# Patient Record
Sex: Male | Born: 1935 | Race: White | Hispanic: No | Marital: Single | Smoking: Former smoker
Health system: Southern US, Community
[De-identification: ages and names within clinical notes are randomized; demographics above are authoritative.]

## PROBLEM LIST (undated history)

## (undated) DIAGNOSIS — I272 Pulmonary hypertension, unspecified: Secondary | ICD-10-CM

## (undated) DIAGNOSIS — K219 Gastro-esophageal reflux disease without esophagitis: Secondary | ICD-10-CM

## (undated) DIAGNOSIS — IMO0002 Reserved for concepts with insufficient information to code with codable children: Secondary | ICD-10-CM

## (undated) DIAGNOSIS — T464X5A Adverse effect of angiotensin-converting-enzyme inhibitors, initial encounter: Secondary | ICD-10-CM

## (undated) DIAGNOSIS — IMO0001 Reserved for inherently not codable concepts without codable children: Secondary | ICD-10-CM

## (undated) DIAGNOSIS — R0602 Shortness of breath: Secondary | ICD-10-CM

## (undated) DIAGNOSIS — E7800 Pure hypercholesterolemia, unspecified: Secondary | ICD-10-CM

## (undated) DIAGNOSIS — E1140 Type 2 diabetes mellitus with diabetic neuropathy, unspecified: Secondary | ICD-10-CM

## (undated) DIAGNOSIS — I10 Essential (primary) hypertension: Secondary | ICD-10-CM

## (undated) DIAGNOSIS — R9430 Abnormal result of cardiovascular function study, unspecified: Secondary | ICD-10-CM

## (undated) DIAGNOSIS — I7100 Dissection of unspecified site of aorta: Secondary | ICD-10-CM

## (undated) DIAGNOSIS — I350 Nonrheumatic aortic (valve) stenosis: Secondary | ICD-10-CM

## (undated) DIAGNOSIS — Z7901 Long term (current) use of anticoagulants: Secondary | ICD-10-CM

## (undated) DIAGNOSIS — S62309A Unspecified fracture of unspecified metacarpal bone, initial encounter for closed fracture: Secondary | ICD-10-CM

## (undated) DIAGNOSIS — R058 Other specified cough: Secondary | ICD-10-CM

## (undated) DIAGNOSIS — E785 Hyperlipidemia, unspecified: Secondary | ICD-10-CM

## (undated) DIAGNOSIS — R05 Cough: Secondary | ICD-10-CM

## (undated) DIAGNOSIS — C679 Malignant neoplasm of bladder, unspecified: Secondary | ICD-10-CM

## (undated) DIAGNOSIS — Z9889 Other specified postprocedural states: Secondary | ICD-10-CM

## (undated) DIAGNOSIS — C61 Malignant neoplasm of prostate: Secondary | ICD-10-CM

## (undated) DIAGNOSIS — Z9289 Personal history of other medical treatment: Secondary | ICD-10-CM

## (undated) DIAGNOSIS — K5731 Diverticulosis of large intestine without perforation or abscess with bleeding: Secondary | ICD-10-CM

## (undated) DIAGNOSIS — I340 Nonrheumatic mitral (valve) insufficiency: Secondary | ICD-10-CM

## (undated) DIAGNOSIS — I4891 Unspecified atrial fibrillation: Secondary | ICD-10-CM

## (undated) HISTORY — DX: Personal history of other medical treatment: Z92.89

## (undated) HISTORY — DX: Shortness of breath: R06.02

## (undated) HISTORY — DX: Other specified cough: R05.8

## (undated) HISTORY — DX: Abnormal result of cardiovascular function study, unspecified: R94.30

## (undated) HISTORY — DX: Hyperlipidemia, unspecified: E78.5

## (undated) HISTORY — DX: Type 2 diabetes mellitus with diabetic neuropathy, unspecified: E11.40

## (undated) HISTORY — DX: Adverse effect of angiotensin-converting-enzyme inhibitors, initial encounter: T46.4X5A

## (undated) HISTORY — DX: Cough: R05

## (undated) HISTORY — PX: OTHER SURGICAL HISTORY: SHX169

## (undated) HISTORY — DX: Unspecified atrial fibrillation: I48.91

## (undated) HISTORY — DX: Unspecified fracture of unspecified metacarpal bone, initial encounter for closed fracture: S62.309A

## (undated) HISTORY — PX: LUMBAR LAMINECTOMY: SHX95

## (undated) HISTORY — DX: Reserved for inherently not codable concepts without codable children: IMO0001

## (undated) HISTORY — DX: Nonrheumatic mitral (valve) insufficiency: I34.0

## (undated) HISTORY — DX: Reserved for concepts with insufficient information to code with codable children: IMO0002

## (undated) HISTORY — DX: Gastro-esophageal reflux disease without esophagitis: K21.9

## (undated) HISTORY — DX: Dissection of unspecified site of aorta: I71.00

## (undated) HISTORY — DX: Other specified postprocedural states: Z98.890

## (undated) HISTORY — DX: Malignant neoplasm of bladder, unspecified: C67.9

## (undated) HISTORY — DX: Pulmonary hypertension, unspecified: I27.20

## (undated) HISTORY — DX: Essential (primary) hypertension: I10

## (undated) HISTORY — DX: Long term (current) use of anticoagulants: Z79.01

## (undated) HISTORY — DX: Nonrheumatic aortic (valve) stenosis: I35.0

---

## 2000-09-19 ENCOUNTER — Emergency Department (HOSPITAL_COMMUNITY): Admission: EM | Admit: 2000-09-19 | Discharge: 2000-09-19 | Payer: Self-pay | Admitting: Emergency Medicine

## 2000-09-19 ENCOUNTER — Encounter: Payer: Self-pay | Admitting: Emergency Medicine

## 2000-09-29 ENCOUNTER — Ambulatory Visit (HOSPITAL_COMMUNITY): Admission: RE | Admit: 2000-09-29 | Discharge: 2000-09-29 | Payer: Self-pay | Admitting: Internal Medicine

## 2000-09-29 ENCOUNTER — Encounter: Payer: Self-pay | Admitting: Internal Medicine

## 2000-10-05 ENCOUNTER — Ambulatory Visit: Admission: RE | Admit: 2000-10-05 | Discharge: 2000-10-05 | Payer: Self-pay | Admitting: Neurosurgery

## 2000-10-05 ENCOUNTER — Encounter: Payer: Self-pay | Admitting: Neurosurgery

## 2000-10-07 ENCOUNTER — Ambulatory Visit (HOSPITAL_COMMUNITY): Admission: RE | Admit: 2000-10-07 | Discharge: 2000-10-07 | Payer: Self-pay | Admitting: Cardiology

## 2000-10-19 ENCOUNTER — Inpatient Hospital Stay (HOSPITAL_COMMUNITY): Admission: RE | Admit: 2000-10-19 | Discharge: 2000-10-20 | Payer: Self-pay | Admitting: Neurosurgery

## 2000-10-19 ENCOUNTER — Encounter: Payer: Self-pay | Admitting: Neurosurgery

## 2004-01-12 ENCOUNTER — Ambulatory Visit: Payer: Self-pay | Admitting: Cardiology

## 2004-01-24 ENCOUNTER — Ambulatory Visit: Payer: Self-pay | Admitting: Cardiology

## 2004-02-21 ENCOUNTER — Ambulatory Visit: Payer: Self-pay | Admitting: *Deleted

## 2004-03-06 ENCOUNTER — Ambulatory Visit: Payer: Self-pay | Admitting: Cardiology

## 2004-03-07 ENCOUNTER — Ambulatory Visit: Payer: Self-pay | Admitting: Internal Medicine

## 2004-03-15 ENCOUNTER — Ambulatory Visit: Payer: Self-pay | Admitting: Cardiovascular Disease

## 2004-03-20 ENCOUNTER — Encounter: Admission: RE | Admit: 2004-03-20 | Discharge: 2004-03-20 | Payer: Self-pay | Admitting: Urology

## 2004-03-22 ENCOUNTER — Ambulatory Visit (HOSPITAL_BASED_OUTPATIENT_CLINIC_OR_DEPARTMENT_OTHER): Admission: RE | Admit: 2004-03-22 | Discharge: 2004-03-22 | Payer: Self-pay | Admitting: Urology

## 2004-03-22 ENCOUNTER — Encounter (INDEPENDENT_AMBULATORY_CARE_PROVIDER_SITE_OTHER): Payer: Self-pay | Admitting: *Deleted

## 2004-03-22 ENCOUNTER — Ambulatory Visit (HOSPITAL_COMMUNITY): Admission: RE | Admit: 2004-03-22 | Discharge: 2004-03-22 | Payer: Self-pay | Admitting: Urology

## 2004-03-22 HISTORY — PX: CYSTOSTOMY W/ BLADDER BIOPSY: SHX1431

## 2004-04-02 ENCOUNTER — Ambulatory Visit: Payer: Self-pay | Admitting: *Deleted

## 2004-04-26 ENCOUNTER — Ambulatory Visit: Payer: Self-pay | Admitting: Cardiology

## 2004-04-29 ENCOUNTER — Ambulatory Visit (HOSPITAL_COMMUNITY): Admission: RE | Admit: 2004-04-29 | Discharge: 2004-04-29 | Payer: Self-pay | Admitting: Internal Medicine

## 2004-04-29 ENCOUNTER — Ambulatory Visit: Payer: Self-pay | Admitting: Internal Medicine

## 2004-05-07 ENCOUNTER — Encounter (INDEPENDENT_AMBULATORY_CARE_PROVIDER_SITE_OTHER): Payer: Self-pay | Admitting: Specialist

## 2004-05-07 ENCOUNTER — Inpatient Hospital Stay (HOSPITAL_COMMUNITY): Admission: RE | Admit: 2004-05-07 | Discharge: 2004-05-08 | Payer: Self-pay | Admitting: Urology

## 2004-05-20 ENCOUNTER — Ambulatory Visit: Payer: Self-pay | Admitting: Cardiology

## 2004-06-03 ENCOUNTER — Ambulatory Visit: Payer: Self-pay | Admitting: Cardiology

## 2004-06-17 ENCOUNTER — Ambulatory Visit: Payer: Self-pay | Admitting: Internal Medicine

## 2004-07-01 ENCOUNTER — Ambulatory Visit: Payer: Self-pay | Admitting: Cardiology

## 2004-07-30 ENCOUNTER — Ambulatory Visit: Payer: Self-pay | Admitting: *Deleted

## 2004-08-27 ENCOUNTER — Ambulatory Visit: Payer: Self-pay | Admitting: Cardiology

## 2004-09-20 ENCOUNTER — Ambulatory Visit: Payer: Self-pay | Admitting: Cardiology

## 2004-10-18 ENCOUNTER — Ambulatory Visit: Payer: Self-pay | Admitting: Internal Medicine

## 2004-11-15 ENCOUNTER — Ambulatory Visit: Payer: Self-pay | Admitting: Cardiology

## 2004-11-19 ENCOUNTER — Ambulatory Visit: Payer: Self-pay | Admitting: Internal Medicine

## 2004-12-06 ENCOUNTER — Ambulatory Visit: Payer: Self-pay | Admitting: Internal Medicine

## 2005-01-13 ENCOUNTER — Ambulatory Visit: Payer: Self-pay | Admitting: Cardiology

## 2005-01-14 ENCOUNTER — Ambulatory Visit: Payer: Self-pay | Admitting: Internal Medicine

## 2005-01-28 ENCOUNTER — Ambulatory Visit: Payer: Self-pay | Admitting: Internal Medicine

## 2005-02-10 ENCOUNTER — Ambulatory Visit: Payer: Self-pay | Admitting: Cardiology

## 2005-03-10 ENCOUNTER — Ambulatory Visit: Payer: Self-pay | Admitting: Cardiology

## 2005-04-04 ENCOUNTER — Ambulatory Visit: Payer: Self-pay | Admitting: Cardiology

## 2005-05-02 ENCOUNTER — Ambulatory Visit: Payer: Self-pay | Admitting: Cardiology

## 2005-05-06 ENCOUNTER — Ambulatory Visit: Payer: Self-pay | Admitting: Internal Medicine

## 2005-05-16 ENCOUNTER — Ambulatory Visit: Payer: Self-pay | Admitting: Cardiovascular Disease

## 2005-06-13 ENCOUNTER — Ambulatory Visit: Payer: Self-pay

## 2005-06-13 ENCOUNTER — Ambulatory Visit: Payer: Self-pay | Admitting: Internal Medicine

## 2005-07-18 ENCOUNTER — Ambulatory Visit: Payer: Self-pay | Admitting: Internal Medicine

## 2005-08-05 ENCOUNTER — Ambulatory Visit: Payer: Self-pay | Admitting: Internal Medicine

## 2005-08-27 ENCOUNTER — Ambulatory Visit: Payer: Self-pay | Admitting: Internal Medicine

## 2005-08-27 ENCOUNTER — Ambulatory Visit: Payer: Self-pay | Admitting: Cardiology

## 2005-10-14 ENCOUNTER — Ambulatory Visit: Payer: Self-pay | Admitting: Cardiology

## 2005-11-04 ENCOUNTER — Ambulatory Visit: Payer: Self-pay | Admitting: Internal Medicine

## 2005-11-11 ENCOUNTER — Ambulatory Visit: Payer: Self-pay | Admitting: Cardiovascular Disease

## 2005-11-24 ENCOUNTER — Ambulatory Visit: Payer: Self-pay | Admitting: Cardiology

## 2005-12-08 ENCOUNTER — Ambulatory Visit: Payer: Self-pay | Admitting: Cardiovascular Disease

## 2005-12-18 ENCOUNTER — Ambulatory Visit: Payer: Self-pay | Admitting: Internal Medicine

## 2005-12-22 ENCOUNTER — Ambulatory Visit: Payer: Self-pay | Admitting: Cardiovascular Disease

## 2006-01-05 ENCOUNTER — Ambulatory Visit: Payer: Self-pay | Admitting: Cardiology

## 2006-01-26 ENCOUNTER — Ambulatory Visit: Payer: Self-pay | Admitting: Cardiology

## 2006-02-16 ENCOUNTER — Ambulatory Visit: Payer: Self-pay | Admitting: Cardiology

## 2006-03-16 ENCOUNTER — Ambulatory Visit: Payer: Self-pay | Admitting: Cardiology

## 2006-03-30 ENCOUNTER — Ambulatory Visit: Payer: Self-pay | Admitting: Internal Medicine

## 2006-04-20 ENCOUNTER — Ambulatory Visit: Payer: Self-pay | Admitting: Internal Medicine

## 2006-05-18 ENCOUNTER — Ambulatory Visit: Payer: Self-pay | Admitting: Internal Medicine

## 2006-06-03 ENCOUNTER — Ambulatory Visit: Payer: Self-pay | Admitting: Internal Medicine

## 2006-06-03 LAB — CONVERTED CEMR LAB
ALT: 22 units/L (ref 0–40)
AST: 20 units/L (ref 0–37)
BUN: 14 mg/dL (ref 6–23)
CO2: 32 meq/L (ref 19–32)
Calcium: 9.2 mg/dL (ref 8.4–10.5)
Creatinine, Ser: 0.8 mg/dL (ref 0.4–1.5)
Creatinine,U: 146.8 mg/dL
Microalb Creat Ratio: 15.7 mg/g (ref 0.0–30.0)
Microalb, Ur: 2.3 mg/dL — ABNORMAL HIGH (ref 0.0–1.9)
Potassium: 3.9 meq/L (ref 3.5–5.1)
Triglycerides: 123 mg/dL (ref 0–149)
VLDL: 25 mg/dL (ref 0–40)

## 2006-06-15 ENCOUNTER — Ambulatory Visit: Payer: Self-pay | Admitting: Cardiology

## 2006-06-17 ENCOUNTER — Ambulatory Visit: Payer: Self-pay | Admitting: Internal Medicine

## 2006-07-02 ENCOUNTER — Ambulatory Visit: Payer: Self-pay | Admitting: Cardiovascular Disease

## 2006-07-21 ENCOUNTER — Ambulatory Visit: Payer: Self-pay | Admitting: Internal Medicine

## 2006-07-21 ENCOUNTER — Ambulatory Visit: Payer: Self-pay | Admitting: Cardiology

## 2006-07-21 LAB — CONVERTED CEMR LAB
Chloride: 106 meq/L (ref 96–112)
Creatinine, Ser: 0.8 mg/dL (ref 0.4–1.5)
GFR calc non Af Amer: 100 mL/min
Glucose, Bld: 114 mg/dL — ABNORMAL HIGH (ref 70–99)
Potassium: 3.9 meq/L (ref 3.5–5.1)
Sodium: 141 meq/L (ref 135–145)

## 2006-08-27 ENCOUNTER — Ambulatory Visit: Payer: Self-pay | Admitting: Cardiology

## 2006-09-15 ENCOUNTER — Ambulatory Visit: Payer: Self-pay | Admitting: Internal Medicine

## 2006-09-24 ENCOUNTER — Ambulatory Visit: Payer: Self-pay | Admitting: Cardiology

## 2006-10-22 ENCOUNTER — Ambulatory Visit: Payer: Self-pay | Admitting: Cardiology

## 2006-11-06 ENCOUNTER — Ambulatory Visit: Payer: Self-pay | Admitting: Internal Medicine

## 2006-11-20 ENCOUNTER — Ambulatory Visit: Payer: Self-pay | Admitting: Cardiology

## 2006-12-04 ENCOUNTER — Encounter: Payer: Self-pay | Admitting: Internal Medicine

## 2006-12-04 ENCOUNTER — Ambulatory Visit: Payer: Self-pay | Admitting: Internal Medicine

## 2006-12-04 DIAGNOSIS — C679 Malignant neoplasm of bladder, unspecified: Secondary | ICD-10-CM

## 2006-12-04 DIAGNOSIS — E1140 Type 2 diabetes mellitus with diabetic neuropathy, unspecified: Secondary | ICD-10-CM

## 2006-12-04 HISTORY — DX: Type 2 diabetes mellitus with diabetic neuropathy, unspecified: E11.40

## 2006-12-11 ENCOUNTER — Ambulatory Visit: Payer: Self-pay | Admitting: Cardiology

## 2007-01-08 ENCOUNTER — Ambulatory Visit: Payer: Self-pay | Admitting: Cardiology

## 2007-02-05 ENCOUNTER — Ambulatory Visit: Payer: Self-pay | Admitting: Cardiology

## 2007-03-05 ENCOUNTER — Ambulatory Visit: Payer: Self-pay | Admitting: Cardiology

## 2007-03-09 ENCOUNTER — Ambulatory Visit: Payer: Self-pay | Admitting: Internal Medicine

## 2007-03-22 ENCOUNTER — Encounter: Payer: Self-pay | Admitting: Internal Medicine

## 2007-04-13 ENCOUNTER — Encounter: Payer: Self-pay | Admitting: Internal Medicine

## 2007-04-13 ENCOUNTER — Encounter (INDEPENDENT_AMBULATORY_CARE_PROVIDER_SITE_OTHER): Payer: Self-pay | Admitting: Urology

## 2007-04-13 ENCOUNTER — Ambulatory Visit (HOSPITAL_BASED_OUTPATIENT_CLINIC_OR_DEPARTMENT_OTHER): Admission: RE | Admit: 2007-04-13 | Discharge: 2007-04-13 | Payer: Self-pay | Admitting: Urology

## 2007-04-13 HISTORY — PX: OTHER SURGICAL HISTORY: SHX169

## 2007-04-23 ENCOUNTER — Ambulatory Visit: Payer: Self-pay | Admitting: Cardiology

## 2007-05-07 ENCOUNTER — Ambulatory Visit: Payer: Self-pay | Admitting: Cardiology

## 2007-05-24 ENCOUNTER — Encounter: Payer: Self-pay | Admitting: Internal Medicine

## 2007-05-25 ENCOUNTER — Ambulatory Visit: Payer: Self-pay | Admitting: Cardiovascular Disease

## 2007-06-07 ENCOUNTER — Ambulatory Visit: Payer: Self-pay | Admitting: Internal Medicine

## 2007-06-07 LAB — CONVERTED CEMR LAB
BUN: 12 mg/dL (ref 6–23)
Calcium: 9.2 mg/dL (ref 8.4–10.5)
Cholesterol: 93 mg/dL (ref 0–200)
Creatinine, Ser: 0.8 mg/dL (ref 0.4–1.5)
GFR calc Af Amer: 122 mL/min
GFR calc non Af Amer: 100 mL/min
Hgb A1c MFr Bld: 7.3 % — ABNORMAL HIGH (ref 4.6–6.0)
LDL Cholesterol: 44 mg/dL (ref 0–99)
Total CHOL/HDL Ratio: 3
Triglycerides: 93 mg/dL (ref 0–149)
VLDL: 19 mg/dL (ref 0–40)

## 2007-06-09 ENCOUNTER — Encounter: Payer: Self-pay | Admitting: Internal Medicine

## 2007-06-15 ENCOUNTER — Ambulatory Visit: Payer: Self-pay | Admitting: Cardiology

## 2007-07-20 ENCOUNTER — Ambulatory Visit: Payer: Self-pay | Admitting: Cardiology

## 2007-08-03 ENCOUNTER — Telehealth: Payer: Self-pay | Admitting: Internal Medicine

## 2007-08-06 ENCOUNTER — Telehealth: Payer: Self-pay | Admitting: Internal Medicine

## 2007-08-09 ENCOUNTER — Encounter: Payer: Self-pay | Admitting: Internal Medicine

## 2007-08-17 ENCOUNTER — Ambulatory Visit: Payer: Self-pay | Admitting: Cardiology

## 2007-09-07 ENCOUNTER — Ambulatory Visit: Payer: Self-pay | Admitting: Internal Medicine

## 2007-09-07 DIAGNOSIS — N400 Enlarged prostate without lower urinary tract symptoms: Secondary | ICD-10-CM | POA: Insufficient documentation

## 2007-09-07 DIAGNOSIS — M159 Polyosteoarthritis, unspecified: Secondary | ICD-10-CM | POA: Insufficient documentation

## 2007-09-14 ENCOUNTER — Ambulatory Visit: Payer: Self-pay | Admitting: Internal Medicine

## 2007-09-24 ENCOUNTER — Telehealth: Payer: Self-pay | Admitting: Internal Medicine

## 2007-10-06 ENCOUNTER — Ambulatory Visit: Payer: Self-pay | Admitting: Cardiology

## 2007-10-18 ENCOUNTER — Telehealth: Payer: Self-pay | Admitting: Internal Medicine

## 2007-11-03 ENCOUNTER — Ambulatory Visit: Payer: Self-pay | Admitting: Internal Medicine

## 2007-12-01 ENCOUNTER — Ambulatory Visit: Payer: Self-pay | Admitting: Cardiology

## 2007-12-06 ENCOUNTER — Ambulatory Visit: Payer: Self-pay | Admitting: Internal Medicine

## 2007-12-06 LAB — CONVERTED CEMR LAB
BUN: 10 mg/dL (ref 6–23)
Calcium: 9.3 mg/dL (ref 8.4–10.5)
Creatinine, Ser: 0.9 mg/dL (ref 0.4–1.5)
GFR calc Af Amer: 106 mL/min
GFR calc non Af Amer: 87 mL/min

## 2007-12-07 ENCOUNTER — Encounter: Payer: Self-pay | Admitting: Internal Medicine

## 2007-12-29 ENCOUNTER — Ambulatory Visit: Payer: Self-pay | Admitting: Cardiology

## 2007-12-30 ENCOUNTER — Telehealth: Payer: Self-pay | Admitting: Internal Medicine

## 2008-01-07 ENCOUNTER — Telehealth: Payer: Self-pay | Admitting: Internal Medicine

## 2008-01-26 ENCOUNTER — Ambulatory Visit: Payer: Self-pay | Admitting: Cardiovascular Disease

## 2008-02-16 ENCOUNTER — Ambulatory Visit: Payer: Self-pay | Admitting: Cardiology

## 2008-03-01 ENCOUNTER — Encounter: Payer: Self-pay | Admitting: Internal Medicine

## 2008-03-07 ENCOUNTER — Ambulatory Visit: Payer: Self-pay | Admitting: Internal Medicine

## 2008-03-07 LAB — CONVERTED CEMR LAB
BUN: 13 mg/dL (ref 6–23)
Calcium: 9.6 mg/dL (ref 8.4–10.5)
Creatinine, Ser: 0.9 mg/dL (ref 0.4–1.5)
GFR calc Af Amer: 106 mL/min
Glucose, Bld: 220 mg/dL — ABNORMAL HIGH (ref 70–99)
Potassium: 3.7 meq/L (ref 3.5–5.1)

## 2008-03-09 ENCOUNTER — Encounter: Payer: Self-pay | Admitting: Internal Medicine

## 2008-03-13 ENCOUNTER — Telehealth: Payer: Self-pay | Admitting: Internal Medicine

## 2008-03-15 ENCOUNTER — Ambulatory Visit: Payer: Self-pay | Admitting: Cardiology

## 2008-04-12 ENCOUNTER — Ambulatory Visit: Payer: Self-pay | Admitting: Cardiology

## 2008-05-10 ENCOUNTER — Ambulatory Visit: Payer: Self-pay | Admitting: Cardiovascular Disease

## 2008-05-11 ENCOUNTER — Ambulatory Visit: Payer: Self-pay | Admitting: Internal Medicine

## 2008-05-15 ENCOUNTER — Encounter: Payer: Self-pay | Admitting: Internal Medicine

## 2008-05-23 ENCOUNTER — Ambulatory Visit: Payer: Self-pay | Admitting: Cardiovascular Disease

## 2008-06-04 ENCOUNTER — Encounter: Payer: Self-pay | Admitting: Internal Medicine

## 2008-07-19 ENCOUNTER — Ambulatory Visit: Payer: Self-pay | Admitting: Cardiology

## 2008-07-27 ENCOUNTER — Ambulatory Visit: Payer: Self-pay | Admitting: Internal Medicine

## 2008-07-28 ENCOUNTER — Ambulatory Visit: Payer: Self-pay | Admitting: Internal Medicine

## 2008-07-28 LAB — CONVERTED CEMR LAB
Basophils Absolute: 0 10*3/uL (ref 0.0–0.1)
Calcium: 9 mg/dL (ref 8.4–10.5)
GFR calc non Af Amer: 77.21 mL/min (ref >60)
Glucose, Bld: 142 mg/dL — ABNORMAL HIGH (ref 70–99)
HDL: 34.3 mg/dL — ABNORMAL LOW (ref >39.00)
Hemoglobin: 13.5 g/dL (ref 13.0–17.0)
Hgb A1c MFr Bld: 7.3 % — ABNORMAL HIGH (ref 4.6–6.5)
LDL Cholesterol: 44 mg/dL (ref 0–99)
Lymphocytes Relative: 17.4 % (ref 12.0–46.0)
Monocytes Relative: 6.3 % (ref 3.0–12.0)
Neutro Abs: 6.8 10*3/uL (ref 1.4–7.7)
Platelets: 223 10*3/uL (ref 150.0–400.0)
RDW: 14.6 % (ref 11.5–14.6)
Sodium: 143 meq/L (ref 135–145)
VLDL: 12 mg/dL (ref 0.0–40.0)
Vitamin B-12: 377 pg/mL (ref 211–911)

## 2008-08-01 ENCOUNTER — Encounter: Payer: Self-pay | Admitting: *Deleted

## 2008-08-16 ENCOUNTER — Ambulatory Visit: Payer: Self-pay | Admitting: Cardiology

## 2008-08-16 LAB — CONVERTED CEMR LAB: POC INR: 1.7

## 2008-08-17 ENCOUNTER — Telehealth: Payer: Self-pay | Admitting: Internal Medicine

## 2008-08-23 ENCOUNTER — Telehealth: Payer: Self-pay | Admitting: Internal Medicine

## 2008-08-24 ENCOUNTER — Ambulatory Visit: Payer: Self-pay | Admitting: Internal Medicine

## 2008-08-31 ENCOUNTER — Encounter: Payer: Self-pay | Admitting: Internal Medicine

## 2008-09-06 ENCOUNTER — Encounter (INDEPENDENT_AMBULATORY_CARE_PROVIDER_SITE_OTHER): Payer: Self-pay | Admitting: Cardiology

## 2008-09-06 ENCOUNTER — Ambulatory Visit: Payer: Self-pay | Admitting: Internal Medicine

## 2008-09-06 ENCOUNTER — Encounter: Payer: Self-pay | Admitting: *Deleted

## 2008-09-06 LAB — CONVERTED CEMR LAB
POC INR: 1.9
Prothrombin Time: 17 s

## 2008-10-07 ENCOUNTER — Encounter: Payer: Self-pay | Admitting: Cardiology

## 2008-10-23 ENCOUNTER — Encounter: Payer: Self-pay | Admitting: Cardiology

## 2008-10-24 ENCOUNTER — Ambulatory Visit: Payer: Self-pay | Admitting: Cardiology

## 2008-10-24 ENCOUNTER — Ambulatory Visit: Payer: Self-pay | Admitting: Internal Medicine

## 2008-10-24 LAB — CONVERTED CEMR LAB: POC INR: 2.2

## 2008-10-30 ENCOUNTER — Ambulatory Visit: Payer: Self-pay | Admitting: Internal Medicine

## 2008-10-31 ENCOUNTER — Telehealth (INDEPENDENT_AMBULATORY_CARE_PROVIDER_SITE_OTHER): Payer: Self-pay | Admitting: *Deleted

## 2008-11-13 ENCOUNTER — Encounter: Payer: Self-pay | Admitting: Cardiology

## 2008-11-13 ENCOUNTER — Ambulatory Visit: Payer: Self-pay

## 2008-11-14 ENCOUNTER — Encounter: Payer: Self-pay | Admitting: Cardiology

## 2008-11-15 ENCOUNTER — Ambulatory Visit: Payer: Self-pay | Admitting: Cardiology

## 2008-11-16 LAB — CONVERTED CEMR LAB
CO2: 27 meq/L (ref 19–32)
Calcium: 9.3 mg/dL (ref 8.4–10.5)
Chloride: 105 meq/L (ref 96–112)
Glucose, Bld: 96 mg/dL (ref 70–99)
Sodium: 141 meq/L (ref 135–145)

## 2008-11-20 ENCOUNTER — Encounter: Payer: Self-pay | Admitting: Internal Medicine

## 2008-11-21 ENCOUNTER — Ambulatory Visit: Payer: Self-pay | Admitting: Cardiology

## 2008-11-29 ENCOUNTER — Ambulatory Visit: Payer: Self-pay | Admitting: Internal Medicine

## 2008-12-25 ENCOUNTER — Encounter: Payer: Self-pay | Admitting: Cardiology

## 2008-12-26 ENCOUNTER — Ambulatory Visit: Payer: Self-pay | Admitting: Cardiology

## 2008-12-26 ENCOUNTER — Ambulatory Visit: Payer: Self-pay | Admitting: Cardiovascular Disease

## 2008-12-26 LAB — CONVERTED CEMR LAB: POC INR: 1.8

## 2009-01-16 ENCOUNTER — Ambulatory Visit: Payer: Self-pay | Admitting: Cardiology

## 2009-01-16 LAB — CONVERTED CEMR LAB: POC INR: 2.1

## 2009-01-17 ENCOUNTER — Encounter (INDEPENDENT_AMBULATORY_CARE_PROVIDER_SITE_OTHER): Payer: Self-pay | Admitting: *Deleted

## 2009-02-01 ENCOUNTER — Ambulatory Visit: Payer: Self-pay | Admitting: Internal Medicine

## 2009-02-01 LAB — CONVERTED CEMR LAB
BUN: 17 mg/dL (ref 6–23)
Chloride: 102 meq/L (ref 96–112)
GFR calc non Af Amer: 77.1 mL/min (ref >60)
Potassium: 4 meq/L (ref 3.5–5.1)
Sodium: 139 meq/L (ref 135–145)

## 2009-02-03 ENCOUNTER — Telehealth (INDEPENDENT_AMBULATORY_CARE_PROVIDER_SITE_OTHER): Payer: Self-pay | Admitting: *Deleted

## 2009-02-06 ENCOUNTER — Ambulatory Visit: Payer: Self-pay | Admitting: Internal Medicine

## 2009-02-07 ENCOUNTER — Ambulatory Visit: Payer: Self-pay | Admitting: Cardiology

## 2009-02-07 LAB — CONVERTED CEMR LAB: POC INR: 2.3

## 2009-02-26 ENCOUNTER — Telehealth: Payer: Self-pay | Admitting: Internal Medicine

## 2009-03-07 ENCOUNTER — Ambulatory Visit: Payer: Self-pay | Admitting: Cardiovascular Disease

## 2009-03-07 LAB — CONVERTED CEMR LAB: POC INR: 2

## 2009-04-04 ENCOUNTER — Ambulatory Visit: Payer: Self-pay | Admitting: Internal Medicine

## 2009-04-04 LAB — CONVERTED CEMR LAB: POC INR: 2.4

## 2009-04-05 ENCOUNTER — Telehealth (INDEPENDENT_AMBULATORY_CARE_PROVIDER_SITE_OTHER): Payer: Self-pay | Admitting: *Deleted

## 2009-04-12 ENCOUNTER — Encounter (INDEPENDENT_AMBULATORY_CARE_PROVIDER_SITE_OTHER): Payer: Self-pay | Admitting: *Deleted

## 2009-04-27 ENCOUNTER — Ambulatory Visit: Payer: Self-pay | Admitting: Internal Medicine

## 2009-04-27 LAB — CONVERTED CEMR LAB
Calcium: 9.3 mg/dL (ref 8.4–10.5)
GFR calc non Af Amer: 87.02 mL/min (ref >60)
Glucose, Bld: 90 mg/dL (ref 70–99)
Hgb A1c MFr Bld: 6 % (ref 4.6–6.5)
Potassium: 3.9 meq/L (ref 3.5–5.1)
Sodium: 141 meq/L (ref 135–145)

## 2009-04-29 ENCOUNTER — Telehealth: Payer: Self-pay | Admitting: Internal Medicine

## 2009-05-07 ENCOUNTER — Telehealth: Payer: Self-pay | Admitting: Internal Medicine

## 2009-05-09 ENCOUNTER — Ambulatory Visit: Payer: Self-pay | Admitting: Cardiovascular Disease

## 2009-05-09 LAB — CONVERTED CEMR LAB: POC INR: 3.2

## 2009-07-16 ENCOUNTER — Ambulatory Visit: Payer: Self-pay | Admitting: Cardiovascular Disease

## 2009-07-16 LAB — CONVERTED CEMR LAB: POC INR: 1.9

## 2009-07-25 ENCOUNTER — Encounter: Payer: Self-pay | Admitting: Cardiology

## 2009-07-26 ENCOUNTER — Ambulatory Visit: Payer: Self-pay | Admitting: Cardiology

## 2009-08-06 ENCOUNTER — Ambulatory Visit: Payer: Self-pay | Admitting: Cardiology

## 2009-08-06 LAB — CONVERTED CEMR LAB: POC INR: 2.6

## 2009-08-10 ENCOUNTER — Ambulatory Visit: Payer: Self-pay | Admitting: Internal Medicine

## 2009-08-10 LAB — CONVERTED CEMR LAB
BUN: 16 mg/dL (ref 6–23)
Calcium: 9.4 mg/dL (ref 8.4–10.5)
Chloride: 104 meq/L (ref 96–112)
Creatinine, Ser: 0.9 mg/dL (ref 0.4–1.5)
Hgb A1c MFr Bld: 6.1 % (ref 4.6–6.5)

## 2009-08-11 ENCOUNTER — Telehealth: Payer: Self-pay | Admitting: Internal Medicine

## 2009-08-13 ENCOUNTER — Encounter: Payer: Self-pay | Admitting: Internal Medicine

## 2009-08-23 ENCOUNTER — Telehealth: Payer: Self-pay | Admitting: Internal Medicine

## 2009-09-06 ENCOUNTER — Ambulatory Visit: Payer: Self-pay | Admitting: Internal Medicine

## 2009-09-06 LAB — CONVERTED CEMR LAB: POC INR: 3.2

## 2009-09-21 ENCOUNTER — Ambulatory Visit: Payer: Self-pay | Admitting: Cardiology

## 2009-11-01 ENCOUNTER — Ambulatory Visit: Payer: Self-pay | Admitting: Internal Medicine

## 2009-11-01 ENCOUNTER — Ambulatory Visit: Payer: Self-pay | Admitting: Cardiology

## 2009-11-01 LAB — CONVERTED CEMR LAB: INR: 2

## 2009-11-12 ENCOUNTER — Ambulatory Visit: Payer: Self-pay | Admitting: Internal Medicine

## 2009-11-13 ENCOUNTER — Encounter: Payer: Self-pay | Admitting: Internal Medicine

## 2009-11-13 ENCOUNTER — Ambulatory Visit: Payer: Self-pay | Admitting: Cardiology

## 2009-11-14 ENCOUNTER — Ambulatory Visit: Payer: Self-pay | Admitting: Internal Medicine

## 2009-11-16 ENCOUNTER — Encounter: Payer: Self-pay | Admitting: Cardiology

## 2009-11-22 ENCOUNTER — Telehealth: Payer: Self-pay | Admitting: Cardiology

## 2009-11-29 ENCOUNTER — Ambulatory Visit: Payer: Self-pay | Admitting: Cardiology

## 2009-12-20 ENCOUNTER — Ambulatory Visit: Payer: Self-pay | Admitting: Cardiovascular Disease

## 2009-12-25 ENCOUNTER — Ambulatory Visit: Payer: Self-pay | Admitting: Internal Medicine

## 2010-01-10 ENCOUNTER — Ambulatory Visit: Payer: Self-pay | Admitting: Cardiology

## 2010-01-10 LAB — CONVERTED CEMR LAB: POC INR: 2.5

## 2010-01-31 ENCOUNTER — Ambulatory Visit: Payer: Self-pay | Admitting: Internal Medicine

## 2010-01-31 ENCOUNTER — Ambulatory Visit: Payer: Self-pay | Admitting: Cardiovascular Disease

## 2010-01-31 LAB — CONVERTED CEMR LAB
ALT: 26 units/L (ref 0–53)
AST: 25 units/L (ref 0–37)
Albumin: 4.1 g/dL (ref 3.5–5.2)
Alkaline Phosphatase: 72 units/L (ref 39–117)
CO2: 29 meq/L (ref 19–32)
Calcium: 9 mg/dL (ref 8.4–10.5)
Creatinine, Ser: 0.9 mg/dL (ref 0.4–1.5)
GFR calc non Af Amer: 82.59 mL/min (ref >60)
Glucose, Bld: 147 mg/dL — ABNORMAL HIGH (ref 70–99)
Hgb A1c MFr Bld: 6 % (ref 4.6–6.5)
POC INR: 3
Total Bilirubin: 0.7 mg/dL (ref 0.3–1.2)
Total CHOL/HDL Ratio: 3
Triglycerides: 56 mg/dL (ref 0.0–149.0)

## 2010-02-11 ENCOUNTER — Ambulatory Visit: Payer: Self-pay | Admitting: Internal Medicine

## 2010-02-12 ENCOUNTER — Encounter: Payer: Self-pay | Admitting: Internal Medicine

## 2010-02-13 ENCOUNTER — Telehealth: Payer: Self-pay | Admitting: Internal Medicine

## 2010-02-14 ENCOUNTER — Encounter: Payer: Self-pay | Admitting: Internal Medicine

## 2010-02-21 ENCOUNTER — Encounter: Payer: Self-pay | Admitting: Internal Medicine

## 2010-02-21 ENCOUNTER — Ambulatory Visit (HOSPITAL_COMMUNITY)
Admission: RE | Admit: 2010-02-21 | Discharge: 2010-02-21 | Payer: Self-pay | Source: Home / Self Care | Attending: Internal Medicine | Admitting: Internal Medicine

## 2010-02-21 ENCOUNTER — Ambulatory Visit: Payer: Self-pay

## 2010-02-27 ENCOUNTER — Ambulatory Visit: Payer: Self-pay | Admitting: Cardiology

## 2010-03-19 ENCOUNTER — Encounter: Payer: Self-pay | Admitting: Cardiology

## 2010-03-20 ENCOUNTER — Ambulatory Visit: Admit: 2010-03-20 | Payer: Self-pay | Admitting: Cardiology

## 2010-04-04 NOTE — Procedures (Signed)
Summary: Summary Report  Summary Report   Imported By: Erle Crocker 11/30/2009 13:03:31  _____________________________________________________________________  External Attachment:    Type:   Image     Comment:   External Document

## 2010-04-04 NOTE — Progress Notes (Signed)
Summary: ECHO  Phone Note From Other Clinic   Summary of Call: PER MD pt needs echo soon, original referral did not state 2D echo. Put new referral in EMR. [facility] AWARE Initial call taken by: Lamar Sprinkles, CMA,  February 13, 2010 2:30 PM

## 2010-04-04 NOTE — Miscellaneous (Signed)
  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 atrial fib rate is controlled... there is some bradycardia but no marked pauses Diabetes mellitus, type II Hypertension DDD lumbar spine GERD Coumadin Rx Gout Fluid overload ACE Inhibitor cough Shortness of breath Aortic stenosis... mild...echo.. September... 2010 EF 60%.[name] Kitchen echo September, 2010 Nuclear.[name] KitchenMarland Kitchen4/2007..no ischemia ABI...2007.[name] KitchenMarland KitchenWNL (03/19/2010 14:32) Added new observation of PRIMARY MD: Jacques Navy MD (03/19/2010 14:32)       Past History:  Past Medical History: UCD, whooping cough, scarlitina bladder cancer-TCCA, recurrence with TUR-B March '09 Atrial fibrillation.[name] KitchenMarland KitchenChronic  /  24-hour Holter, September, 2011.... atrial fib rate is controlled... there is some bradycardia but no marked pauses Diabetes mellitus, type II Hypertension DDD lumbar spine GERD Coumadin Rx Gout Fluid overload ACE Inhibitor cough Shortness of breath Aortic stenosis... mild...echo.. September... 2010 EF 60%.[name] Kitchen echo September, 2010 Nuclear.[name] KitchenMarland Kitchen4/2007..no ischemia ABI...2007.[name] KitchenMarland KitchenWNL

## 2010-04-04 NOTE — Miscellaneous (Signed)
  Clinical Lists Changes  Observations: Added new observation of PAST MED HX: UCD, whooping cough, scarlitina bladder cancer-TCCA, recurrence with TUR-B March '09 Atrial fibrillation.[name] KitchenMarland KitchenChronic Diabetes mellitus, type II Hypertension DDD lumbar spine GERD Coumadin Rx Gout Fluid overload ACE Inhibitor cough Shortness of breath Aortic stenosis... mild...echo.. September... 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 mild...echo.. September... 2010 EF 60%.[name] Kitchen echo September, 2010 Nuclear.[name] KitchenMarland Kitchen4/2007..no ischemia ABI...2007.[name] KitchenMarland KitchenWNL

## 2010-04-04 NOTE — Progress Notes (Signed)
  Phone Note Outgoing Call   Reason for Call: Discuss lab or test results Summary of Call: Please call - A1C 6% -- good work. Enjoy your cruise but watch your diet. [place]  Thanks Initial call taken by: Jacques Navy MD,  April 29, 2009 9:15 PM  Follow-up for Phone Call        informed pt of results Follow-up by: Ami Bullins CMA,  April 30, 2009 10:14 AM

## 2010-04-04 NOTE — Assessment & Plan Note (Signed)
Summary: 3 MO ROV /NWS #   Vital Signs:  Patient profile:   77 year old male Weight:      244 pounds BMI:     37.23 Temp:     97.2 degrees F Pulse rate:   67 / minute BP sitting:   100 / 68  (left arm) Cuff size:   regular  Vitals Entered By: Lamar Sprinkles, CMA (August 10, 2009 10:06 AM) CC: CPX    Primary Care Provider:  Jacques Navy MD  CC:  CPX .  History of Present Illness: Patient presents for rouitne follow-up. He had a great 60days cruise and did gain weight up to 250 lbs. He has lost 4 lbs since his return. He did see Dr. Myrtis Ser 2 weeks ago and was cardiac stable. His diabetes has been well controlled. He is feeling well.   Current Medications (verified): 1)  Pantoprazole Sodium 40 Mg Tbec (Pantoprazole Sodium) .[name] Kitchen.. 1 By Mouth Qam 2)  Celebrex 200 Mg  Caps (Celecoxib) .... Once Daily 3)  Lipitor 20 Mg  Tabs (Atorvastatin Calcium) .... Q Pm 4)  Verapamil Hcl Cr 240 Mg  Tbcr (Verapamil Hcl) .... Once Daily 5)  Zolpidem Tartrate 5 Mg Tabs (Zolpidem Tartrate) .[name] Kitchen.. 1 By Mouth At Bedtime 6)  Actos 45 Mg Tabs (Pioglitazone Hcl) .[name] Kitchen.. 1 Once Daily 7)  Allopurinol 300 Mg  Tabs (Allopurinol) .... Once Daily 8)  Doxazosin Mesylate 4 Mg Tabs (Doxazosin Mesylate) .... 2 Once Daily 9)  Senokot 8.6 Mg  Tabs (Sennosides) .... As Needed 10)  Oncovite   Tabs (Multiple Vitamins-Minerals) .... Take 1 Tablet By Mouth Once A Day 11)  Finasteride 5 Mg  Tabs (Finasteride) .... Take 1 Tablet By Mouth Once A Day 12)  Warfarin Sodium 4 Mg Tabs (Warfarin Sodium) .... Use As Directed By Anticoagulation Clinic 13)  Furosemide 20 Mg Tabs (Furosemide) .... Take One Daily 14)  Potassium Chloride Crys Cr 20 Meq Cr-Tabs (Potassium Chloride Crys Cr) .... 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 mild...echo.. September... 2010 EF 60%.[name] Kitchen echo September, 2010 Nuclear.[name] KitchenMarland Kitchen4/2007..no ischemia ABI...2007.[name] KitchenMarland KitchenWNL  Past Surgical History: Last updated: 06/07/2007 Lumbar laminectomy-'02 TUR-BT '06, '09  Family History: Last updated: 12/04/2006 non-contributory  Social History: Last updated: 12/04/2006 confirmed bat[place]lor retired Engineer, site world travel[employer]  Risk Factors: Alcohol Use: <1 (12/04/2006) Caffeine Use: 0 (12/04/2006) Exercise: no (12/04/2006)  Risk Factors: Smoking Status: quit (12/04/2006) PMH-FH-SH reviewed-no changes except otherwise noted  Review of Systems  The patient denies anorexia, fever, weight loss, weight gain, chest pain, syncope, dyspnea on exertion, peripheral edema, headaches, abdominal pain, hematochezia, incontinence, muscle weakness, difficulty walking, depression, and angioedema.    Physical Exam  General:  overweight white male who looks younger than his stated age of 48. He is in good spirits Head:  normocephalic and atraumatic.   Eyes:  pupils equal, pupils round, corneas and lenses clear, and no injection.   Ears:  R ear normal and L ear normal.   Nose:  no external deformity and no external erythema.   Mouth:  good dentition and no gingival abnormalities.   Neck:  supple and full ROM.   Chest Wall:  no deformities and no tenderness.  Lungs:  normal respiratory effort, normal breath sounds, no crackles, and no wheezes.   Heart:  normal rate, regular rhythm, no murmur, no JVD, and no HJR.   Abdomen:  soft, non-tender, and no splenomegaly.   Msk:  normal ROM, no joint tenderness, no joint swelling, and no redness over joints.   Pulses:  2+ radial and DP pulses Extremities:  No clubbing, cyanosis, edema, or deformity noted with normal  full range of motion of all joints.   Neurologic:  alert & oriented X3, cranial nerves II-XII intact, gait normal, DTRs symmetrical and normal, and heel-to-shin normal.   Skin:  turgor normal, color normal, no suspicious lesions, and no purpura.   Cervical Nodes:  no anterior cervical adenopathy and no posterior cervical adenopathy.   Psych:  Oriented X3, memory intact for recent and remote, normally interactive, and good eye contact.    Diabetes Management Exam:    Eye Exam:       Eye Exam done elsewhere          Date: 10/18/2008          Results: normal          Done by: shapiro   Impression & Recommendations:  Problem # 1:  HYPERTENSION (ICD-401.9)  His updated medication list for this problem includes:    Verapamil Hcl Cr 240 Mg Tbcr (Verapamil hcl) ..... Once daily    Doxazosin Mesylate 4 Mg Tabs (Doxazosin mesylate) .[name] Kitchen... 2 once daily    Furosemide 20 Mg Tabs (Furosemide) .[name] Kitchen... Take one daily  BP today: 100/68 Prior BP: 110/70 (07/26/2009)  Labs Reviewed: K+: 3.9 (04/27/2009) Creat: : 0.9 (04/27/2009)     Good control- will continue present medications.  Problem # 2:  DIABETES MELLITUS, TYPE II (ICD-250.00) Patient was indescrete with his diet while on his recent 60 day cruise. Prior to departure his A1C was good.   Plan - f/u A1C with recommendations to follow.  His updated medication list for this problem includes:    Actos 45 Mg Tabs (Pioglitazone hcl) .[name] Kitchen... 1 once daily  Orders: TLB-BMP (Basic Metabolic Panel-BMET) (80048-METABOL) TLB-A1C / Hgb A1C (Glycohemoglobin) (83036-A1C)  Addendum - A1C 6.1%!!  No change in regimen is needed.  Complete Medication List: 1)  Pantoprazole Sodium 40 Mg Tbec (Pantoprazole sodium) .[name] Kitchen.. 1 by mouth qam 2)  Celebrex 200 Mg Caps (Celecoxib) .... Once daily 3)  Lipitor 20 Mg Tabs (Atorvastatin calcium) .... Q pm 4)  Verapamil Hcl Cr 240 Mg Tbcr (Verapamil hcl) .... Once daily 5)  Zolpidem Tartrate 5 Mg Tabs (Zolpidem tartrate)  .[name] Kitchen.. 1 by mouth at bedtime 6)  Actos 45 Mg Tabs (Pioglitazone hcl) .[name] Kitchen.. 1 once daily 7)  Allopurinol 300 Mg Tabs (Allopurinol) .... Once daily 8)  Doxazosin Mesylate 4 Mg Tabs (Doxazosin mesylate) .... 2 once daily 9)  Senokot 8.6 Mg Tabs (Sennosides) .... As needed 10)  Oncovite Tabs (Multiple vitamins-minerals) .... Take 1 tablet by mouth once a day 11)  Finasteride 5 Mg Tabs (Finasteride) .... Take 1 tablet by mouth once a day 12)  Warfarin Sodium 4 Mg Tabs (Warfarin sodium) .... Use as directed by anticoagulation clinic 13)  Furosemide 20 Mg Tabs (Furosemide) .... Take one daily 14)  Potassium Chloride Crys Cr 20 Meq Cr-tabs (Potassium chloride crys cr) .... Take one tablet by mouth daily

## 2010-04-04 NOTE — Miscellaneous (Signed)
  Clinical Lists Changes  Observations: Added new observation of CARDIO HPI: Holter monitor is reviewed.  Patient has atrial fibrillation throughout the Holter.  The rates are well controlled.  There is no significant tachycardia.  There is some bradycardia at times.  There are no more pauses.  I will not adjust his medicines at this time. (11/16/2009 13:39) Added new observation of PRIMARY MD: Jacques Navy MD (11/16/2009 13:39)      Primary Provider:  Jacques Navy MD   History of Present Illness: Holter monitor is reviewed.  Patient has atrial fibrillation throughout the Holter.  The rates are well controlled.  There is no significant tachycardia.  There is some bradycardia at times.  There are no more pauses.  I will not adjust his medicines at this time.

## 2010-04-04 NOTE — Assessment & Plan Note (Signed)
Summary: R LEG PAIN   STC   Vital Signs:  Patient profile:   77 year old male Height:      68 inches Weight:      245 pounds BMI:     37.39 O2 Sat:      94 % on Room air Temp:     97.8 degrees F oral Pulse rate:   55 / minute BP sitting:   112 / 80  (right arm) Cuff size:   regular  Vitals Entered By: Bill Salinas CMA (December 25, 2009 4:53 PM)  O2 Flow:  Room air CC: pt here with c/o pain from knee to his calf only on his right leg/ ab   Primary Care Provider:  Jacques Navy MD  CC:  pt here with c/o pain from knee to his calf only on his right leg/ ab.  History of Present Illness: Patient presents c/o pain in the distal right LE, worse at night and intermittent in nature. It is a sharp pain and not like a cramp and not like a burning pain. It has interfered with his sleep on occasion. He has had no injury. He has not had a rash or any swelling. He has not done any new physical activities. He has no SOB or C/P  Current Medications (verified): 1)  Pantoprazole Sodium 40 Mg Tbec (Pantoprazole Sodium) .[name] Kitchen.. 1 By Mouth Qam 2)  Celebrex 200 Mg  Caps (Celecoxib) .... Once Daily 3)  Lipitor 20 Mg  Tabs (Atorvastatin Calcium) .... Q Pm 4)  Verapamil Hcl Cr 240 Mg  Tbcr (Verapamil Hcl) .... Once Daily 5)  Zolpidem Tartrate 5 Mg Tabs (Zolpidem Tartrate) .[name] Kitchen.. 1 By Mouth At Bedtime 6)  Actos 45 Mg Tabs (Pioglitazone Hcl) .[name] Kitchen.. 1 Once Daily 7)  Allopurinol 300 Mg  Tabs (Allopurinol) .... Once Daily 8)  Doxazosin Mesylate 4 Mg Tabs (Doxazosin Mesylate) .... 2 Once Daily 9)  Senokot 8.6 Mg  Tabs (Sennosides) .... As Needed 10)  Oncovite   Tabs (Multiple Vitamins-Minerals) .... Take 1 Tablet By Mouth Once A Day 11)  Finasteride 5 Mg  Tabs (Finasteride) .... Take 1 Tablet By Mouth Once A Day 12)  Warfarin Sodium 4 Mg Tabs (Warfarin Sodium) .... Use As Directed By Anticoagulation Clinic 13)  Furosemide 20 Mg Tabs (Furosemide) .... Take One Daily 14)  Potassium Chloride Crys Cr 20 Meq Cr-Tabs  (Potassium Chloride Crys Cr) .... 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 mild...echo.. September... 2010 EF 60%.[name] Kitchen echo September, 2010 Nuclear.[name] KitchenMarland Kitchen4/2007..no ischemia ABI...2007.[name] KitchenMarland KitchenWNL  Past Surgical History: Last updated: 06/07/2007 Lumbar laminectomy-'02 TUR-BT '06, '09 PSH reviewed for relevance, FH reviewed for relevance  Review of Systems  The patient denies anorexia, fever, weight loss, chest pain, muscle weakness, suspicious skin lesions, difficulty walking, and enlarged lymph nodes.    Physical Exam  General:  Well-developed,well-nourished,in no acute distress; alert,appropriate and cooperative throughout examination Lungs:  normal respiratory effort and normal breath sounds.   Heart:  normal rate and regular rhythm.   Msk:  right leg without swelling, deformity. He has full ROM at the ankle and knee. No palpable mass, no tenderness to palpation, negative Homan's. Pulses:  2+ DP pulse right Neurologic:  alert &  oriented X3, cranial nerves II-XII intact, and gait normal.   Skin:  turgor normal and color normal.  No rash right leg Psych:  Oriented X3, memory intact for recent and remote, normally interactive, and good eye contact.     Impression & Recommendations:  Problem # 1:  LEG PAIN, RIGHT (ICD-729.5) leg pain that is not a radicular pain, no evidence of DVT, no evidence of neuropathy, no mass.  Plan - leg stretches before bed           APAP 1000mg  three times a day as needed.           Return for persistent pain or change in condition.  Complete Medication List: 1)  Pantoprazole Sodium  40 Mg Tbec (Pantoprazole sodium) .[name] Kitchen.. 1 by mouth qam 2)  Celebrex 200 Mg Caps (Celecoxib) .... Once daily 3)  Lipitor 20 Mg Tabs (Atorvastatin calcium) .... Q pm 4)  Verapamil Hcl Cr 240 Mg Tbcr (Verapamil hcl) .... Once daily 5)  Zolpidem Tartrate 5 Mg Tabs (Zolpidem tartrate) .[name] Kitchen.. 1 by mouth at bedtime 6)  Actos 45 Mg Tabs (Pioglitazone hcl) .[name] Kitchen.. 1 once daily 7)  Allopurinol 300 Mg Tabs (Allopurinol) .... Once daily 8)  Doxazosin Mesylate 4 Mg Tabs (Doxazosin mesylate) .... 2 once daily 9)  Senokot 8.6 Mg Tabs (Sennosides) .... As needed 10)  Oncovite Tabs (Multiple vitamins-minerals) .... Take 1 tablet by mouth once a day 11)  Finasteride 5 Mg Tabs (Finasteride) .... Take 1 tablet by mouth once a day 12)  Warfarin Sodium 4 Mg Tabs (Warfarin sodium) .... Use as directed by anticoagulation clinic 13)  Furosemide 20 Mg Tabs (Furosemide) .... Take one daily 14)  Potassium Chloride Crys Cr 20 Meq Cr-tabs (Potassium chloride crys cr) .... Take one tablet by mouth daily   Orders Added: 1)  Est. Patient Level III [40981]

## 2010-04-04 NOTE — Letter (Signed)
Summary: [facility]  [facility]   Imported By: Lester Walsh 08/17/2009 07:59:52  _____________________________________________________________________  External Attachment:    Type:   Image     Comment:   External Document

## 2010-04-04 NOTE — Letter (Signed)
Summary: Colonoscopy Date Change Letter  [facility]  [address] [place], [place] [address]   Phone: [phone]  Fax: [phone]      April 12, 2009 MRN: [id number]   Eric Rivers [address] [place], [place]  [address]   Dear Mr. Hilburn,   Previously you were recommended to have a repeat colonoscopy around this time. Your chart was recently reviewed by Dr. Judie Petit T. Russella Dar of [facility]. Follow up colonoscopy is now recommended in March 2014. This revised recommendation is based on current, nationally recognized guidelines for colorectal cancer screening and polyp surveillance. These guidelines are endorsed by the American Cancer Society, [employer] on Colorectal Cancer as well as numerous other major medical organizations.  Please understand that our recommendation assumes that you do not have any new symptoms such as bleeding, a change in bowel habits, anemia, or significant abdominal discomfort. If you do have any concerning GI symptoms or want to discuss the guideline recommendations, please call to arrange an office visit at your earliest convenience. Otherwise we will keep you in our reminder system and contact you 1-2 months prior to the date listed above to schedule your next colonoscopy.  Thank you,  Judie Petit T. Russella Dar, M.D.  [facility] [phone]

## 2010-04-04 NOTE — Medication Information (Signed)
Summary: rov/eac  Anticoagulant Therapy  Managed by: Weston Brass, PharmD Referring MD: Willa Rough MD PCP: Jacques Navy MD Supervising MD: Clifton James MD, Cristal Deer Indication 1: Atrial Fibrillation (ICD-427.31) Lab Used: [facility] [place] Site: [address] INR POC 1.9 INR RANGE 2 - 3  Dietary changes: yes       Details: Considerable changes, more greens and alcohol, Patient was on vacation  Health status changes: no    Bleeding/hemorrhagic complications: no    Recent/future hospitalizations: no    Any changes in medication regimen? no    Recent/future dental: no  Any missed doses?: no       Is patient compliant with meds? yes       Allergies: No Known Drug Allergies  Anticoagulation Management History:      The patient is taking warfarin and comes in today for a routine follow up visit.  Positive risk factors for bleeding include an age of 2 years or older and presence of serious comorbidities.  The bleeding index is 'intermediate risk'.  Positive CHADS2 values include History of HTN, Age > 62 years old, and History of Diabetes.  The start date was 02/18/2001.  Anticoagulation responsible provider: Clifton James MD, Cristal Deer.  INR POC: 1.9.  Cuvette Lot#: [id number].  Exp: 10/2010.    Anticoagulation Management Assessment/Plan:      The patient's current anticoagulation dose is Warfarin sodium 4 mg tabs: Use as directed by Anticoagulation Clinic.  The target INR is 2 - 3.  The next INR is due 08/06/2009.  Anticoagulation instructions were given to patient.  Results were reviewed/authorized by Weston Brass, PharmD.  He was notified by [facility].         Prior Anticoagulation Instructions: INR 3.2  Skip tomorrow's dosage of coumadin, then resume same dosage 1 tablet daily except 1.5 tablets on Mondays, Wednesdays, and Fridays.    Current Anticoagulation Instructions: INR-1.9 Take extra 1/2 a tablet today. Resume normal schedule . Take 1.5 tablets on  Monday, Wednesday and Friday  take 1 tablet on all other days of the week.

## 2010-04-04 NOTE — Medication Information (Signed)
Summary: rov/ln   Anticoagulant Therapy  Managed by: Lyna Poser PharmD Referring MD: Willa Rough MD PCP: Jacques Navy MD Supervising MD: Graciela Husbands MD,Steven Indication 1: Atrial Fibrillation (ICD-427.31) Lab Used: [facility] [place] Site: [address] INR POC 2 INR RANGE 2 - 3  Dietary changes: yes       Details: drinking more alcohol on cruise. A little more vitamin K containing foods than normal but getting back to normal eating pattern.  Health status changes: no    Bleeding/hemorrhagic complications: no    Recent/future hospitalizations: no    Any changes in medication regimen? no    Recent/future dental: no  Any missed doses?: no       Is patient compliant with meds? yes       Allergies: No Known Drug Allergies  Anticoagulation Management History:      The patient is taking warfarin and comes in today for a routine follow up visit.  Positive risk factors for bleeding include an age of 75 years or older and presence of serious comorbidities.  The bleeding index is 'intermediate risk'.  Positive CHADS2 values include History of HTN, Age > 53 years old, and History of Diabetes.  The start date was 02/18/2001.  Today's INR is 2.  Anticoagulation responsible Braylee Lal: Graciela Husbands MD,Steven.  INR POC: 2.  Cuvette Lot#: [id number].  Exp: 12/2010.    Anticoagulation Management Assessment/Plan:      The patient's current anticoagulation dose is Warfarin sodium 4 mg tabs: Use as directed by Anticoagulation Clinic.  The target INR is 2 - 3.  The next INR is due 11/29/2009.  Anticoagulation instructions were given to patient.  Results were reviewed/authorized by Lyna Poser PharmD.  He was notified by Lyna Poser PharmD.         Prior Anticoagulation Instructions: INR 3.1  Take 1/2 tab tomorrow and then continue same dose of 1.5 tabs on Monday, Wednesday, and Friday and 1 tab all other days.  Re-check INR on 11/01/09.    Current Anticoagulation Instructions: INR  2 Continue taking 1 tablet on sunday, tuesday, and thursday, and saturday. Take 1.5 tablets on Monday, wednesday, and friday. September 29 at 10:15

## 2010-04-04 NOTE — Medication Information (Signed)
Summary: rov/eac  Anticoagulant Therapy  Managed by: Cloyde Reams, RN, BSN Referring MD: Willa Rough MD PCP: Jacques Navy MD Supervising MD: Eden Emms MD, Theron Arista Indication 1: Atrial Fibrillation (ICD-427.31) Lab Used: [facility] [place] Site: [address] INR POC 3.2 INR RANGE 2 - 3  Dietary changes: no    Health status changes: no    Bleeding/hemorrhagic complications: no    Recent/future hospitalizations: no    Any changes in medication regimen? no    Recent/future dental: no  Any missed doses?: no       Is patient compliant with meds? yes      Comments: Going on cruise for 2 months will not return until 07/13/09.    Allergies (verified): No Known Drug Allergies  Anticoagulation Management History:      The patient is taking warfarin and comes in today for a routine follow up visit.  Positive risk factors for bleeding include an age of 75 years or older and presence of serious comorbidities.  The bleeding index is 'intermediate risk'.  Positive CHADS2 values include History of HTN, Age > 62 years old, and History of Diabetes.  The start date was 02/18/2001.  Anticoagulation responsible provider: Eden Emms MD, Theron Arista.  INR POC: 3.2.  Cuvette Lot#: [id number].  Exp: 07/2010.    Anticoagulation Management Assessment/Plan:      The patient's current anticoagulation dose is Warfarin sodium 4 mg tabs: Use as directed by Anticoagulation Clinic.  The target INR is 2 - 3.  The next INR is due 07/16/2009.  Anticoagulation instructions were given to patient.  Results were reviewed/authorized by Cloyde Reams, RN, BSN.  He was notified by Cloyde Reams RN.         Prior Anticoagulation Instructions: INR 2.4  Continue current dosing schedule.  Take 1.5 tablets on  Monday, Wednesday, and Friday, and take 1 tablet all other days. Return to clinic prior to leaving for trip.  Current Anticoagulation Instructions: INR 3.2  Skip tomorrow's dosage of coumadin, then resume  same dosage 1 tablet daily except 1.5 tablets on Mondays, Wednesdays, and Fridays.

## 2010-04-04 NOTE — Progress Notes (Signed)
Summary: monitor results   Phone Note Outgoing Call   Call placed by: Meredith Staggers, RN,  November 22, 2009 5:49 PM Call placed to: Patient Summary of Call: called w/results of monitor, a-fib rates are controlled, there is some bradycardia but no marked puases no med change per Dr Myrtis Ser, have left message to call back   Follow-up for Phone Call        pt rtn call to get results Omer Jack  November 23, 2009 9:12 AM   Additional Follow-up for Phone Call Additional follow up Details #1::        Phone call completed   PT AWARE OF MONITOR RESULTS. Additional Follow-up by: Scherrie Bateman, LPN,  November 23, 2009 9:23 AM

## 2010-04-04 NOTE — Medication Information (Signed)
Summary: rov/tm  Anticoagulant Therapy  Managed by: Cloyde Reams, RN, BSN Referring MD: Willa Rough MD PCP: Jacques Navy MD Supervising MD: Ladona Ridgel MD, Sharlot Gowda Indication 1: Atrial Fibrillation (ICD-427.31) Lab Used: [facility] [place] Site: [address] INR POC 3.2 INR RANGE 2 - 3  Dietary changes: no    Health status changes: no    Bleeding/hemorrhagic complications: no    Recent/future hospitalizations: no    Any changes in medication regimen? no    Recent/future dental: no  Any missed doses?: no       Is patient compliant with meds? yes       Allergies: No Known Drug Allergies  Anticoagulation Management History:      The patient is taking warfarin and comes in today for a routine follow up visit.  Positive risk factors for bleeding include an age of 75 years or older and presence of serious comorbidities.  The bleeding index is 'intermediate risk'.  Positive CHADS2 values include History of HTN, Age > 72 years old, and History of Diabetes.  The start date was 02/18/2001.  Anticoagulation responsible provider: Ladona Ridgel MD, Sharlot Gowda.  INR POC: 3.2.  Cuvette Lot#: [id number].  Exp: 11/2010.    Anticoagulation Management Assessment/Plan:      The patient's current anticoagulation dose is Warfarin sodium 4 mg tabs: Use as directed by Anticoagulation Clinic.  The target INR is 2 - 3.  The next INR is due 09/21/2009.  Anticoagulation instructions were given to patient.  Results were reviewed/authorized by Cloyde Reams, RN, BSN.  He was notified by Cloyde Reams RN.         Prior Anticoagulation Instructions: INR 2.6 Continue 4mg s everyday except 6mg s on Mondays, Wednesdays, and Fridays. Recheck in 4 weeks.    Current Anticoagulation Instructions: INR 3.2  Skip tomorrow's dosage of coumadin, then resume same dosage 4mg  daily except 6mg  on Mondays, Wednesdays, and Fridays.  Recheck in 4 weeks.

## 2010-04-04 NOTE — Medication Information (Signed)
Summary: rov/tm  Anticoagulant Therapy  Managed by: Bethena Midget, RN, BSN Referring MD: Willa Rough MD PCP: Jacques Navy MD Supervising MD: Daleen Squibb MD, Maisie Fus Indication 1: Atrial Fibrillation (ICD-427.31) Lab Used: [facility] [place] Site: [address] INR POC 3.1 INR RANGE 2 - 3  Dietary changes: yes       Details: pt has been drinking more alcohol than normal  Health status changes: no    Bleeding/hemorrhagic complications: no    Recent/future hospitalizations: no    Any changes in medication regimen? no    Recent/future dental: no  Any missed doses?: no       Is patient compliant with meds? yes      Comments: pt is going on a cruise on 7/23 and will not return until 8/26. will re-check INR once he returns  Allergies: No Known Drug Allergies  Anticoagulation Management History:      The patient is taking warfarin and comes in today for a routine follow up visit.  Positive risk factors for bleeding include an age of 75 years or older and presence of serious comorbidities.  The bleeding index is 'intermediate risk'.  Positive CHADS2 values include History of HTN, Age > 57 years old, and History of Diabetes.  The start date was 02/18/2001.  Anticoagulation responsible provider: Daleen Squibb MD, Maisie Fus.  INR POC: 3.1.  Cuvette Lot#: [id number].  Exp: 12/2010.    Anticoagulation Management Assessment/Plan:      The patient's current anticoagulation dose is Warfarin sodium 4 mg tabs: Use as directed by Anticoagulation Clinic.  The target INR is 2 - 3.  The next INR is due 11/01/2009.  Anticoagulation instructions were given to patient.  Results were reviewed/authorized by Bethena Midget, RN, BSN.  He was notified by Dillard Cannon.         Prior Anticoagulation Instructions: INR 3.2  Skip tomorrow's dosage of coumadin, then resume same dosage 4mg  daily except 6mg  on Mondays, Wednesdays, and Fridays.  Recheck in 4 weeks.    Current Anticoagulation Instructions: INR  3.1  Take 1/2 tab tomorrow and then continue same dose of 1.5 tabs on Monday, Wednesday, and Friday and 1 tab all other days.  Re-check INR on 11/01/09.

## 2010-04-04 NOTE — Assessment & Plan Note (Signed)
Summary: 3 MO ROV /NWS   Vital Signs:  Patient profile:   77 year old male Height:      68 inches Weight:      240 pounds Temp:     98.1 degrees F oral Pulse rate:   59 / minute BP sitting:   108 / 66  (left arm) Cuff size:   large  Vitals Entered By: Lamar Sprinkles, CMA (April 27, 2009 1:19 PM)   Primary Care Provider:  Jacques Navy MD   History of Present Illness: Patient presents for routine follow-up. He did see Dr. Aldean Ast today and his bladder is good. Last A1C in December 6.6%. His chief complaint is that he still has significant nocturia. He will be starting on [place] when he returns from his March-May cruise.   Current Medications (verified): 1)  Pantoprazole Sodium 40 Mg Tbec (Pantoprazole Sodium) .[name] Kitchen.. 1 By Mouth Qam 2)  Celebrex 200 Mg  Caps (Celecoxib) .... Once Daily 3)  Lipitor 20 Mg  Tabs (Atorvastatin Calcium) .... Q Pm 4)  Verapamil Hcl Cr 240 Mg  Tbcr (Verapamil Hcl) .... Once Daily 5)  Zolpidem Tartrate 5 Mg Tabs (Zolpidem Tartrate) .[name] Kitchen.. 1 By Mouth At Bedtime 6)  Actos 45 Mg Tabs (Pioglitazone Hcl) .[name] Kitchen.. 1 Once Daily 7)  Allopurinol 300 Mg  Tabs (Allopurinol) .... Once Daily 8)  Doxazosin Mesylate 4 Mg Tabs (Doxazosin Mesylate) .... 2 Once Daily 9)  Senokot 8.6 Mg  Tabs (Sennosides) .... As Needed 10)  Oncovite   Tabs (Multiple Vitamins-Minerals) .... Take 1 Tablet By Mouth Once A Day 11)  Finasteride 5 Mg  Tabs (Finasteride) .... Take 1 Tablet By Mouth Once A Day 12)  Warfarin Sodium 4 Mg Tabs (Warfarin Sodium) .... Use As Directed By Anticoagulation Clinic 13)  Furosemide 20 Mg Tabs (Furosemide) .... Take Two Tablet By Mouth The First 3 Days Then,take One Tablet By Mouth Daily. 14)  Potassium Chloride Crys Cr 20 Meq Cr-Tabs (Potassium Chloride Crys Cr) .... Take One Tablet By Mouth Daily  Allergies (verified): No Known Drug Allergies PMH-FH-SH reviewed-no changes except otherwise noted  Review of Systems  The patient denies anorexia, fever,  weight loss, decreased hearing, chest pain, syncope, dyspnea on exertion, abdominal pain, hematuria, incontinence, muscle weakness, difficulty walking, depression, and enlarged lymph nodes.         Continues to have nocturia.  Physical Exam  General:  WNWD mildly overweight white male in no distress Head:  Normocephalic and atraumatic without obvious abnormalities. No apparent alopecia or balding. Eyes:  corneas and lenses clear and no injection.   Neck:  full ROM.   Lungs:  Normal respiratory effort, chest expands symmetrically. Lungs are clear to auscultation, no crackles or wheezes. Heart:  IRIR rate controlled. No appreciable murmur Abdomen:  soft and normal bowel sounds.   Msk:  no joint tenderness, no joint swelling, no joint warmth, and no redness over joints.   Pulses:  2+ radial Neurologic:  alert & oriented X3, cranial nerves II-XII intact, and gait normal.   Skin:  Intact without suspicious lesions or rashes Psych:  Oriented X3, memory intact for recent and remote, and normally interactive.     Impression & Recommendations:  Problem # 1:  COUMADIN THERAPY (ICD-V58.61) May switch to Pradaxa on return from his cruise.  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 Once daily    Doxazosin Mesylate 4 Mg Tabs (Doxazosin mesylate) .[name] Kitchen... 2 once daily    Furosemide 20 Mg Tabs (Furosemide) .[name] Kitchen... Take two tablet by mouth the first 3 days then,take one tablet by mouth daily.  Orders: TLB-BMP (Basic Metabolic Panel-BMET) (80048-METABOL)  BP today: 108/66 Prior BP: 128/90 (02/06/2009)  Good control. Continue present medications.  Problem # 4:  DIABETES MELLITUS, TYPE II (ICD-250.00) Due for A1C  His updated medication list for this problem includes:    Actos 45 Mg Tabs (Pioglitazone hcl)  .[name] Kitchen... 1 once daily  Orders: TLB-A1C / Hgb A1C (Glycohemoglobin) (83036-A1C)  Addendum - A1C 6% - excellent. Will check next 1 month or more after return from his cruise.  Problem # 5:  ATRIAL FIBRILLATION (ICD-427.31) Stable with good rate control.  His updated medication list for this problem includes:    Verapamil Hcl Cr 240 Mg Tbcr (Verapamil hcl) ..... Once daily    Warfarin Sodium 4 Mg Tabs (Warfarin sodium) ..... Use as directed by anticoagulation clinic  Complete Medication List: 1)  Pantoprazole Sodium 40 Mg Tbec (Pantoprazole sodium) .[name] Kitchen.. 1 by mouth qam 2)  Celebrex 200 Mg Caps (Celecoxib) .... Once daily 3)  Lipitor 20 Mg Tabs (Atorvastatin calcium) .... Q pm 4)  Verapamil Hcl Cr 240 Mg Tbcr (Verapamil hcl) .... Once daily 5)  Zolpidem Tartrate 5 Mg Tabs (Zolpidem tartrate) .[name] Kitchen.. 1 by mouth at bedtime 6)  Actos 45 Mg Tabs (Pioglitazone hcl) .[name] Kitchen.. 1 once daily 7)  Allopurinol 300 Mg Tabs (Allopurinol) .... Once daily 8)  Doxazosin Mesylate 4 Mg Tabs (Doxazosin mesylate) .... 2 once daily 9)  Senokot 8.6 Mg Tabs (Sennosides) .... As needed 10)  Oncovite Tabs (Multiple vitamins-minerals) .... Take 1 tablet by mouth once a day 11)  Finasteride 5 Mg Tabs (Finasteride) .... Take 1 tablet by mouth once a day 12)  Warfarin Sodium 4 Mg Tabs (Warfarin sodium) .... Use as directed by anticoagulation clinic 13)  Furosemide 20 Mg Tabs (Furosemide) .... Take two tablet by mouth the first 3 days then,take one tablet by mouth daily. 14)  Potassium Chloride Crys Cr 20 Meq Cr-tabs (Potassium chloride crys cr) .... Take one tablet by mouth daily

## 2010-04-04 NOTE — Assessment & Plan Note (Signed)
Summary: 3 month follow up-lb   Vital Signs:  Patient profile:   77 year old male Height:      68 inches Weight:      250 pounds BMI:     38.15 O2 Sat:      96 % on Room air Temp:     97.7 degrees F oral Pulse rate:   56 / minute BP sitting:   102 / 70  (left arm) Cuff size:   regular  Vitals Entered By: Bill Salinas CMA (February 11, 2010 1:22 PM)  O2 Flow:  Room air CC: 3 month follow up last ov pt had c/o right leg pain. Pt states leg pain has resolved/ ab   Primary Care Provider:  Jacques Navy MD  CC:  3 month follow up last ov pt had c/o right leg pain. Pt states leg pain has resolved/ ab.  History of Present Illness: Has a winter cruise starting Jan for a month to [place]. He has been feeling pretty good. He will get a little sratch in the throat, worse supine on the left side. He has a question about lipitor and going to generic.   Patient does have a history of fluid overload, first discovered on a cruise when he got winded walking. He does not do much vigourous  exercise. Last 2 D echo Sept 3, 2010 with EF 55-60%, mild AS mild MR. He does report nocturia x 5-6 with a fair amount of urine volume - 1000-13000 cc per night. He has DOE when walking an incline or going up stairs. He does have increased audible breathsounds in the office today. BNP was check early December and was 414.   Blood sugar has been doing well.   Current Medications (verified): 1)  Pantoprazole Sodium 40 Mg Tbec (Pantoprazole Sodium) .[name] Kitchen.. 1 By Mouth Qam 2)  Celebrex 200 Mg  Caps (Celecoxib) .... Once Daily 3)  Lipitor 20 Mg  Tabs (Atorvastatin Calcium) .... Q Pm 4)  Verapamil Hcl Cr 240 Mg  Tbcr (Verapamil Hcl) .... Once Daily 5)  Zolpidem Tartrate 5 Mg Tabs (Zolpidem Tartrate) .[name] Kitchen.. 1 By Mouth At Bedtime 6)  Actos 45 Mg Tabs (Pioglitazone Hcl) .[name] Kitchen.. 1 Once Daily 7)  Allopurinol 300 Mg  Tabs (Allopurinol) .... Once Daily 8)  Doxazosin Mesylate 4 Mg Tabs (Doxazosin Mesylate) .... 2 Once  Daily 9)  Senokot 8.6 Mg  Tabs (Sennosides) .... As Needed 10)  Oncovite   Tabs (Multiple Vitamins-Minerals) .... Take 1 Tablet By Mouth Once A Day 11)  Finasteride 5 Mg  Tabs (Finasteride) .... Take 1 Tablet By Mouth Once A Day 12)  Warfarin Sodium 4 Mg Tabs (Warfarin Sodium) .... Use As Directed By Anticoagulation Clinic 13)  Furosemide 40 Mg Tabs (Furosemide) .[name] Kitchen.. 1 By Mouth Once Daily 14)  Potassium Chloride Crys Cr 20 Meq Cr-Tabs (Potassium Chloride Crys Cr) .... Take One Tablet By Mouth Daily  Allergies: No Known Drug Allergies  Past History:  Past Medical History: Last updated: 11/01/2009 UCD, whooping cough, scarlitina bladder cancer-TCCA, recurrence with TUR-B March '09 Atrial fibrillation.[name] KitchenMarland KitchenChronic Diabetes mellitus, type II Hypertension DDD lumbar spine GERD Coumadin Rx Gout Fluid overload ACE Inhibitor cough Shortness of breath Aortic stenosis... mild...echo.. September... 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    Physical Exam  General:  overweight whie male who has audible breathsounds at rest.  Head:  normocephalic and atraumatic.   Eyes:  C&S clear Neck:  supple, full ROM, no thyromegaly, and no carotid bruits.   Lungs:  normal respiratory effort, no intercostal retractions, no accessory muscle use, normal breath sounds, no crackles, and no wheezes.   Heart:   normal rate, regular rhythm, and no murmur.  4-5 cm JVD at 30 degrees with 1 cm increase with hepatic pressure. Patient is tender to pressure in the RUQ.  Abdomen:  obese, soft.  Msk:  normal ROM, no joint warmth, no joint instability, and no crepitation.   Pulses:  2+ pulses Neurologic:  alert & oriented X3, cranial nerves II-XII intact, strength normal in all extremities, gait normal, and DTRs symmetrical and normal.   Skin:  turgor normal, color normal, no rashes, and no purpura.   Cervical Nodes:  no anterior cervical adenopathy and no posterior cervical adenopathy.   Psych:  Oriented X3, memory intact for recent and remote, normally interactive, and good eye contact.     Impression & Recommendations:  Problem # 1:  FLUID OVERLOAD (ICD-276.6) Patient with elevated BNP, increased DOE and at rest. Exam is stable. Concern for progressive LV dysfunction  Plan- follow-up 2D echo with medical recommendations to follow.          Reviewed the "rule of 2's"  Orders: Cardiology Referral (Cardiology)  Problem # 2:  DEGENERATIVE JOINT DISEASE, GENERALIZED (ICD-715.00) Stble. He has made concessions and does limit his activities due to discomfort as well as DOE.  Plan - will continue NSAIDs as needed  His updated medication list for this problem includes:    Celebrex 200 Mg Caps (Celecoxib) ..... Once daily  Problem # 3:  HYPERTENSION (ICD-401.9)  His updated medication list for this problem includes:    Verapamil Hcl Cr 240 Mg Tbcr (Verapamil hcl) ..... Once daily    Doxazosin Mesylate 4 Mg Tabs (Doxazosin mesylate) .[name] Kitchen... 2 once daily    Furosemide 40 Mg Tabs (Furosemide) .[name] Kitchen... 1 by mouth once daily  BP today: 102/70 Prior BP: 112/80 (12/25/2009)  Labs Reviewed: K+: 3.8 (01/31/2010) Creat: : 0.9 (01/31/2010)   Stable on his present regimen  Problem # 4:  DIABETES MELLITUS, TYPE II (ICD-250.00)  His updated medication list for this problem includes:    Actos 45 Mg Tabs  (Pioglitazone hcl) .[name] Kitchen... 1 once daily  Labs Reviewed: Creat: 0.9 (01/31/2010)     Last Eye Exam: normal (09/13/2009) Reviewed HgBA1c results: 6.0 (01/31/2010)  6.1 (08/10/2009)  Excellent control.  Problem # 5:  ATRIAL FIBRILLATION (ICD-427.31) Stable rate on present regimen. No complications with takng warfarin.  His updated medication list for this problem includes:    Verapamil Hcl Cr 240 Mg Tbcr (Verapamil hcl) ..... Once daily    Warfarin Sodium 4 Mg Tabs (Warfarin sodium) ..... Use as directed by anticoagulation clinic  Problem # 6:  NEOP, MALIGNANT, BLADDER NEC (ICD-188.8) Current with urology and good results from followup cystoscopy.  Complete Medication List: 1)  Pantoprazole Sodium 40 Mg Tbec (Pantoprazole sodium) .[name] Kitchen.. 1 by mouth qam 2)  Celebrex 200 Mg Caps (Celecoxib) .... Once daily 3)  Lipitor 20 Mg Tabs (Atorvastatin calcium) .... Q pm 4)  Verapamil Hcl Cr 240 Mg  Tbcr (Verapamil hcl) .... Once daily 5)  Zolpidem Tartrate 5 Mg Tabs (Zolpidem tartrate) .[name] Kitchen.. 1 by mouth at bedtime 6)  Actos 45 Mg Tabs (Pioglitazone hcl) .[name] Kitchen.. 1 once daily 7)  Allopurinol 300 Mg Tabs (Allopurinol) .... Once daily 8)  Doxazosin Mesylate 4 Mg Tabs (Doxazosin mesylate) .... 2 once daily 9)  Senokot 8.6 Mg Tabs (Sennosides) .... As needed 10)  Oncovite Tabs (Multiple vitamins-minerals) .... Take 1 tablet by mouth once a day 11)  Finasteride 5 Mg Tabs (Finasteride) .... Take 1 tablet by mouth once a day 12)  Warfarin Sodium 4 Mg Tabs (Warfarin sodium) .... Use as directed by anticoagulation clinic 13)  Furosemide 40 Mg Tabs (Furosemide) .[name] Kitchen.. 1 by mouth once daily 14)  Potassium Chloride Crys Cr 20 Meq Cr-tabs (Potassium chloride crys cr) .... Take one tablet by mouth daily  Patient Instructions: 1)  Diabetes - great control with A1C 6%. No change in regimen 2)  Heart - worried about elevated BNP at 414 (nl 0-100), increased SOB, increased jugular vein distention and tenderness in the RUQ.  Plan - take furosemide 40mg  AM and PM for 3 days, then tak 40mg  daily. Rule of "2's" - weigh daily (same time, same garb) for a gain of 2 lbs in 2 days double the lasix for 2 days. this keeps [place] ahead of fluid gain. Repeat 2 D echo to look for any change from Sept '10 study 3)  Blood pressure - great control. 4)  A. fib - under good control.  Prescriptions: FUROSEMIDE 40 MG TABS (FUROSEMIDE) 1 by mouth once daily  #30 x 12   Entered and Authorized by:   Jacques Navy MD   Signed by:   Jacques Navy MD on 02/11/2010   Method used:   Electronically to        [facility]. [phone]* (retail)       [phone]       [place], [place]  [address]       Ph: [phone]       Fax: [phone]   RxID:   [id number]    Orders Added: 1)  Cardiology Referral [Cardiology] 2)  Est. Patient Level IV [02725]

## 2010-04-04 NOTE — Letter (Signed)
Summary: [facility]  [facility]   Imported By: Lennie Odor 02/18/2010 11:36:46  _____________________________________________________________________  External Attachment:    Type:   Image     Comment:   External Document

## 2010-04-04 NOTE — Assessment & Plan Note (Signed)
Summary: 3 month rov.sl      Allergies Added: NKDA  Visit Type:  Follow-up Primary Provider:  Jacques Navy MD  CC:  atrial fibrillation and shortness of breath.  History of Present Illness: The patient is seen for followup of atrial fibrillation and shortness of breath and fluid overload.  He has just returned from another cruise.  He had lost a few pounds since his last visit and regained them on the cruise is that his weight today is the same as his prior visit.  Once again he is trying to lose weight.  He does have shortness of breath when walking through the airport.  There is no chest pain.  He does not feel significant palpitations.  He has not had significant edema.  Allergies (verified): No Known Drug Allergies  Past History:  Past Medical History: UCD, whooping cough, scarlitina bladder cancer-TCCA, recurrence with TUR-B March '09 Atrial fibrillation.[name] KitchenMarland KitchenChronic Diabetes mellitus, type II Hypertension DDD lumbar spine GERD Coumadin Rx Gout Fluid overload ACE Inhibitor cough Shortness of breath Aortic stenosis... mild...echo.. September... 2010 EF 60%.[name] Kitchen echo September, 2010 Nuclear.[name] KitchenMarland Kitchen4/2007..no ischemia ABI...2007.[name] KitchenMarland KitchenWNL  Review of Systems       Patient denies fever, chills, headache, sweats, rash, change in vision, change in hearing, chest pain, cough, nausea vomiting, urinary symptoms.  All other systems are reviewed and are negative.  Vital Signs:  Patient profile:   77 year old male Height:      68 inches Weight:      248 pounds Pulse rate:   64 / minute BP sitting:   128 / 74  (right arm) Cuff size:   large  Vitals Entered By: Hardin Negus, RMA (November 01, 2009 10:21 AM)  Physical Exam  General:  patient is stable but he is overweight. Head:  head is atraumatic. Eyes:  no xanthelasma. Neck:  no jugular venous attention. Chest Wall:  no chest wall tenderness. Lungs:  lungs are clear.  Respiratory effort is not labored. Heart:  cardiac exam  reveals S1-S2.  There is a soft systolic murmur. Abdomen:  abdomen is protuberant but soft. Msk:  no musculoskeletal deformities. Extremities:  no significant edema. Skin:  no skin rashes. Psych:  patient is oriented to person time and place.  Affect is normal.   Impression & Recommendations:  Problem # 1:  AORTIC STENOSIS (ICD-424.1)  His updated medication list for this problem includes:    Furosemide 20 Mg Tabs (Furosemide) .[name] Kitchen... Take one daily The patient does have mild aortic stenosis.  We know this from the  echo of September 2 010.  He does not need an echo now.  I do not believe this is playing well with shortness of breath.  Problem # 2:  SHORTNESS OF BREATH (ICD-786.05)  His updated medication list for this problem includes:    Verapamil Hcl Cr 240 Mg Tbcr (Verapamil hcl) ..... Once daily    Furosemide 20 Mg Tabs (Furosemide) .[name] Kitchen... Take one daily The patient's shortness of breath was slightly increased with his recent trip.  I suspect this is related to his inactivity and to his weight.  It is possible that he could have increased atrial fibrillation rates with walking.  He'll have a 24-hour Holter to assess his heart rate during the day.  His resting heart rate is not elevated.  I will not adjust his medicines today.  I'll be in touch with him about the Holter results.  Unless we make changes are seen in 6 months in followup.  Orders: Holter (Holter)  Problem # 3:  FLUID OVERLOAD (ICD-276.6) His overall volume status is stable.  He knows to be careful with salt and fluid intake.  I will not adjust his diuretics today.  Problem # 4:  COUMADIN THERAPY (ICD-V58.61) Coumadin discontinued.  No change in therapy.  Problem # 5:  HYPERTENSION (ICD-401.9)  His updated medication list for this problem includes:    Verapamil Hcl Cr 240 Mg Tbcr (Verapamil hcl) ..... Once daily    Doxazosin Mesylate 4 Mg Tabs (Doxazosin mesylate) .[name] Kitchen... 2 once daily    Furosemide 20 Mg Tabs  (Furosemide) .[name] Kitchen... Take one daily Blood pressure is controlled today.  No change in therapy.  Problem # 6:  ATRIAL FIBRILLATION (ICD-427.31)  His updated medication list for this problem includes:    Warfarin Sodium 4 Mg Tabs (Warfarin sodium) ..... Use as directed by anticoagulation clinic  Orders: EKG w/ Interpretation (93000) Holter (Holter) EKG is done today and reviewed by me.  He has atrial fib and the rate is controlled at rest.  Patient Instructions: 1)  Your physician recommends that you continue on your current medications as directed. Please refer to the Current Medication list given to you today. 2)  Your physician wants you to follow-up in:  6 MONTHS You will receive a reminder letter in the mail two months in advance. If you don't receive a letter, please call our office to schedule the follow-up appointment. 3)  Your physician has recommended that you wear a holter monitorFOR 24 HOURS.  Holter monitors are medical devices that record the heart's electrical activity. Doctors most often use these monitors to diagnose arrhythmias. Arrhythmias are problems with the speed or rhythm of the heartbeat. The monitor is a small, portable device. You can wear one while you do your normal daily activities. This is usually used to diagnose what is causing palpitations/syncope (passing out).

## 2010-04-04 NOTE — Assessment & Plan Note (Signed)
Summary: shingles shot./medicare/bcbs/cd   Nurse Visit   Allergies: No Known Drug Allergies  Immunizations Administered:  Zostavax # 1:    Vaccine Type: Zostavax    Site: Left ARM    Mfr: Merck    Dose: 0.65 ml    Route: IM    Given by: Lamar Sprinkles, CMA    Exp. Date: 09/26/2010    Lot #: [id number]    VIS given: 12/13/04 given November 14, 2009.  Orders Added: 1)  Zoster (Shingles) Vaccine Live [90736] 2)  Admin 1st Vaccine [phone]

## 2010-04-04 NOTE — Assessment & Plan Note (Signed)
Summary: 3 mos f/u #/cd - flu shot/cd   Vital Signs:  Patient profile:   77 year old male Height:      68 inches Weight:      244.50 pounds O2 Sat:      95 % Temp:     96.6 degrees F oral Pulse rate:   74 / minute BP sitting:   128 / 82  (left arm)  Vitals Entered By: Jarome Lamas (November 12, 2009 10:45 AM) CC: 3 month fl/up/pb Comments pt will get flu shot today   Primary Care Provider:  Jacques Navy MD  CC:  3 month fl/up/pb.  History of Present Illness: Patient stubbed his 5th toe left foot and sustained a fracture. He reduced it himself and the next day he went to Urgent Care on [address]. Had x-ray  confirmed a tuft fracture. He had his toe buddy-tapped and he is in a wood shoe. He hasn't had a lot of pain.  He did have a successful cruise - put on a little weight but has already lost 4 lbs.   He did see Dr. Myrtis Ser last week and was doing OK. He is to wear a Holter monitor to check for uncontrolled rate with exertion. Reviewed last 2D echo Sept '10 with nl LV, with EF 55-60%, mild AS.  Current Medications (verified): 1)  Pantoprazole Sodium 40 Mg Tbec (Pantoprazole Sodium) .[name] Kitchen.. 1 By Mouth Qam 2)  Celebrex 200 Mg  Caps (Celecoxib) .... Once Daily 3)  Lipitor 20 Mg  Tabs (Atorvastatin Calcium) .... Q Pm 4)  Verapamil Hcl Cr 240 Mg  Tbcr (Verapamil Hcl) .... Once Daily 5)  Zolpidem Tartrate 5 Mg Tabs (Zolpidem Tartrate) .[name] Kitchen.. 1 By Mouth At Bedtime 6)  Actos 45 Mg Tabs (Pioglitazone Hcl) .[name] Kitchen.. 1 Once Daily 7)  Allopurinol 300 Mg  Tabs (Allopurinol) .... Once Daily 8)  Doxazosin Mesylate 4 Mg Tabs (Doxazosin Mesylate) .... 2 Once Daily 9)  Senokot 8.6 Mg  Tabs (Sennosides) .... As Needed 10)  Oncovite   Tabs (Multiple Vitamins-Minerals) .... Take 1 Tablet By Mouth Once A Day 11)  Finasteride 5 Mg  Tabs (Finasteride) .... Take 1 Tablet By Mouth Once A Day 12)  Warfarin Sodium 4 Mg Tabs (Warfarin Sodium) .... Use As Directed By Anticoagulation Clinic 13)  Furosemide 20 Mg  Tabs (Furosemide) .... Take One Daily 14)  Potassium Chloride Crys Cr 20 Meq Cr-Tabs (Potassium Chloride Crys Cr) .... 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 mild...echo.. September... 2010 EF 60%.[name] Kitchen echo September, 2010 Nuclear.[name] KitchenMarland Kitchen4/2007..no ischemia ABI...2007.[name] KitchenMarland KitchenWNL  Past Surgical History: Last updated: 06/07/2007 Lumbar laminectomy-'02 TUR-BT '06, '09 PSH reviewed for relevance, FH reviewed for relevance  Review of Systems       The patient complains of weight gain and difficulty walking.  The patient denies anorexia, fever, vision loss, chest pain, dyspnea on exertion, prolonged cough, abdominal pain, incontinence, depression, and angioedema.         nocturia x 5  Physical Exam  General:  overweight white male in no distress Head:  normocephalic and atraumatic.   Eyes:  C&S clear Lungs:  normal respiratory effort and normal breath sounds.   Heart:  IRIR but slow  well controlled rate Abdomen:  rotund, BS + Msk:  left foot in a "wooden"shoe. Toes not examined Pulses:  2+ radial Neurologic:  alert & oriented X3 and cranial nerves II-XII intact.   Skin:  turgor normal, color normal, no suspicious lesions, and no ulcerations.   Psych:  Oriented X3, normally interactive, good eye contact, and not anxious appearing.    Diabetes Management Exam:    Eye Exam:       Eye Exam done elsewhere          Date: 09/13/2009          Results: normal          Done by: Dr. Nile Riggs   Impression & Recommendations:  Problem # 1:  FRACTURE, TOE, LEFT (ICD-826.0) probable tuft fracture. His toe is buddy taped and  he is using a wooden shoe. encouraged him to walk.  Problem # 2:  SHORTNESS OF BREATH (ICD-786.05) Patient being evaluated for decreased exercise tolerance and DOE. Plan is fo a holter monitor to r/o RVR with exercise. If normal - no tachycardia to explain decreased exercise tolerance - repeat echo may be appropriate.  Problem # 3:  HYPERTENSION (ICD-401.9)  His updated medication list for this problem includes:    Verapamil Hcl Cr 240 Mg Tbcr (Verapamil hcl) ..... Once daily    Doxazosin Mesylate 4 Mg Tabs (Doxazosin mesylate) .[name] Kitchen... 2 once daily    Furosemide 20 Mg Tabs (Furosemide) .[name] Kitchen... Take one daily  BP today: 128/82 Prior BP: 128/74 (11/01/2009)  Labs Reviewed: K+: 3.9 (08/10/2009) Creat: : 0.9 (08/10/2009)   Chol: 90 (07/28/2008)   HDL: 34.30 (07/28/2008)   LDL: 44 (07/28/2008)   TG: 60.0 (07/28/2008)  good control. Will continue present meds  Problem # 4:  DIABETES MELLITUS, TYPE II (ICD-250.00)  His updated medication list for this problem includes:    Actos 45 Mg Tabs (Pioglitazone hcl) .[name] Kitchen... 1 once daily  Labs Reviewed: Creat: 0.9 (08/10/2009)     Last Eye Exam: normal (09/13/2009) Reviewed HgBA1c results: 6.1 (08/10/2009)  6.0 (04/27/2009)  Good control. He asked about bladder cancer and actos - researched this issue: bladder cancer has been reported in <1% of patients on Actos = non-causal association at this reporting level. Recommend continuing medication.Patient is advised to also raise the question with his urologist.  Complete Medication List: 1)  Pantoprazole Sodium 40 Mg Tbec (Pantoprazole sodium) .[name] Kitchen.. 1 by mouth qam 2)  Celebrex 200 Mg Caps (Celecoxib) .... Once daily 3)  Lipitor 20 Mg Tabs (Atorvastatin calcium) .... Q pm 4)  Verapamil Hcl Cr 240 Mg Tbcr (Verapamil hcl) .... Once daily 5)  Zolpidem Tartrate 5 Mg Tabs (Zolpidem tartrate) .[name] Kitchen.. 1 by mouth at bedtime 6)  Actos 45 Mg Tabs (Pioglitazone hcl) .[name] Kitchen.. 1 once daily 7)  Allopurinol 300 Mg Tabs  (Allopurinol) .... Once daily 8)  Doxazosin Mesylate 4 Mg Tabs (Doxazosin mesylate) .... 2 once daily 9)  Senokot 8.6 Mg Tabs (Sennosides) .... As needed 10)  Oncovite Tabs (Multiple vitamins-minerals) .... Take 1 tablet by mouth once a day 11)  Finasteride 5 Mg Tabs (Finasteride) .... Take 1 tablet by mouth once a day 12)  Warfarin Sodium 4 Mg Tabs (Warfarin sodium) .... Use as directed by anticoagulation clinic 13)  Furosemide 20 Mg Tabs (Furosemide) .... Take one daily 14)  Potassium Chloride Crys Cr 20 Meq Cr-tabs (Potassium chloride crys cr) .... Take one tablet by mouth daily  Other Orders: Flu Vaccine 44yrs + MEDICARE PATIENTS (Z6109) Administration  Flu vaccine - MCR (G0008) Prescriptions: ZOLPIDEM TARTRATE 5 MG TABS (ZOLPIDEM TARTRATE) 1 by mouth at bedtime  #90 x 2   Entered and Authorized by:   Jacques Navy MD   Signed by:   Jacques Navy MD on 11/12/2009   Method used:   Print then Give to Patient   RxID:   [id number]   Flu Vaccine Consent Questions     Do you have a history of severe allergic reactions to this vaccine? no    Any prior history of allergic reactions to egg and/or gelatin? no    Do you have a sensitivity to the preservative Thimersol? no    Do you have a past history of Guillan-Barre Syndrome? no    Do you currently have an acute febrile illness? no    Have you ever had a severe reaction to latex? no    Vaccine information given and explained to patient? yes    Are you currently pregnant? no    Lot Number:[id number]   Exp Date:08/31/2010   Site Given  Left Deltoid IMedflu

## 2010-04-04 NOTE — Progress Notes (Signed)
  Phone Note Outgoing Call   Reason for Call: Discuss lab or test results Summary of Call: please call pt: A1C 6.1% - I am shocked and amazed. Good work - must have behaved on his cruise more than he let on.  Initial call taken by: Jacques Navy MD,  August 11, 2009 6:40 PM  Follow-up for Phone Call        called pt left msg to call back Follow-up by: Scharlene Gloss,  August 13, 2009 11:03 AM  Additional Follow-up for Phone Call Additional follow up Details #1::        Patient called back informed of above information. Mailed pt a copy of his labs that he requested. Additional Follow-up by: Scharlene Gloss,  August 13, 2009 2:23 PM

## 2010-04-04 NOTE — Assessment & Plan Note (Signed)
Summary: rov per pt walk in/lg  Medications Added FUROSEMIDE 20 MG TABS (FUROSEMIDE) Take one daily        Visit Type:  Follow-up Primary Provider:  Jacques Navy MD  CC:  shortness of breath.  History of Present Illness: The patient is seen for followup of shortness of breath.  He has been on a cruise for 2 months.  He did gain some weight.  He is trying to lose weight.  He is not having any chest pain.  He does say that if he walks excessively he has shortness of breath.  He is not having PND or orthopnea.  He has not had the redevelopment of any edema.  His last echo was September, 2010.  His ejection fraction 60%.  He has only mild aortic stenosis.  Current Medications (verified): 1)  Pantoprazole Sodium 40 Mg Tbec (Pantoprazole Sodium) .[name] Kitchen.. 1 By Mouth Qam 2)  Celebrex 200 Mg  Caps (Celecoxib) .... Once Daily 3)  Lipitor 20 Mg  Tabs (Atorvastatin Calcium) .... Q Pm 4)  Verapamil Hcl Cr 240 Mg  Tbcr (Verapamil Hcl) .... Once Daily 5)  Zolpidem Tartrate 5 Mg Tabs (Zolpidem Tartrate) .[name] Kitchen.. 1 By Mouth At Bedtime 6)  Actos 45 Mg Tabs (Pioglitazone Hcl) .[name] Kitchen.. 1 Once Daily 7)  Allopurinol 300 Mg  Tabs (Allopurinol) .... Once Daily 8)  Doxazosin Mesylate 4 Mg Tabs (Doxazosin Mesylate) .... 2 Once Daily 9)  Senokot 8.6 Mg  Tabs (Sennosides) .... As Needed 10)  Oncovite   Tabs (Multiple Vitamins-Minerals) .... Take 1 Tablet By Mouth Once A Day 11)  Finasteride 5 Mg  Tabs (Finasteride) .... Take 1 Tablet By Mouth Once A Day 12)  Warfarin Sodium 4 Mg Tabs (Warfarin Sodium) .... Use As Directed By Anticoagulation Clinic 13)  Furosemide 20 Mg Tabs (Furosemide) .... Take One Daily 14)  Potassium Chloride Crys Cr 20 Meq Cr-Tabs (Potassium Chloride Crys Cr) .... Take One Tablet By Mouth Daily  Allergies: No Known Drug Allergies  Past History:  Past Medical History: Last updated: 07/25/2009 UCD, whooping cough, scarlitina bladder cancer-TCCA, recurrence with TUR-B March '09 Atrial  fibrillation.[name] KitchenMarland KitchenChronic Diabetes mellitus, type II Hypertension DDD lumbar spine GERD Coumadin Rx Gout Fluid overload ACE Inhibitor cough Shortness of breath Aortic stenosis... mild...echo.. September... 2010 EF 60%.[name] Kitchen echo September, 2010 Nuclear.[name] KitchenMarland Kitchen4/2007..no ischemia ABI...2007.[name] KitchenMarland KitchenWNL  Review of Systems       Patient denies fever, chills, headache, sweats, rash, change in vision, change in hearing, chest pain, cough, nausea vomiting, urinary symptoms.  All of the systems are reviewed and are negative.  Vital Signs:  Patient profile:   77 year old male Height:      68 inches Weight:      248 pounds Pulse rate:   76 / minute Pulse rhythm:   regular BP sitting:   110 / 70  (left arm)  Vitals Entered By: Jacquelin Hawking, CMA (Jul 26, 2009 10:55 AM)  Physical Exam  General:  he is overweight but stable. Eyes:  no xanthelasma. Neck:  no jugular venous extension. Lungs:  lungs are clear.  Respiratory effort is nonlabored. Heart:  cardiac exam reveals S1-S2.  No clicks or significant murmurs. Abdomen:  abdomen is soft. Msk:  no musculoskeletal deformities. Extremities:  no peripheral edema. Psych:  patient is oriented to person time and place.  Affect is normal.   Impression & Recommendations:  Problem # 1:  AORTIC STENOSIS (ICD-424.1)  His updated medication list for this problem includes:    Furosemide 20  Mg Tabs (Furosemide) .[name] Kitchen... Take one daily Aortic stenosis is only mild by recent echo.  He does not need a followup echo.  Problem # 2:  SHORTNESS OF BREATH (ICD-786.05)  His updated medication list for this problem includes:    Verapamil Hcl Cr 240 Mg Tbcr (Verapamil hcl) ..... Once daily    Furosemide 20 Mg Tabs (Furosemide) .[name] Kitchen... Take one daily I believe that his current shortness of breath is primarily related to his weight gain.  There is no sign of fluid overload.  I feel this is nonischemic.  He will go by usual activities and try to lose some weight and ask  him back in 3 months for followup.  Problem # 3:  COUMADIN THERAPY (ICD-V58.61) We discussed today you're continuing Coumadin for starting Pradaxa.  We decided to use Coumadin for now and discuss the issue further over time  Patient Instructions: 1)  Follow up in 3 months

## 2010-04-04 NOTE — Medication Information (Signed)
Summary: rov/sp   Anticoagulant Therapy  Managed by: Lyna Poser PharmD Referring MD: Willa Rough MD PCP: Jacques Navy MD Supervising MD: Eden Emms MD, Theron Arista Indication 1: Atrial Fibrillation (ICD-427.31) Lab Used: [facility] [place] Site: [address] INR POC 3 INR RANGE 2 - 3  Dietary changes: yes       Details: less greens  Health status changes: no    Bleeding/hemorrhagic complications: no    Recent/future hospitalizations: no    Any changes in medication regimen? no    Recent/future dental: no  Any missed doses?: no       Is patient compliant with meds? yes       Allergies: No Known Drug Allergies  Anticoagulation Management History:      The patient is taking warfarin and comes in today for a routine follow up visit.  Positive risk factors for bleeding include an age of 75 years or older and presence of serious comorbidities.  The bleeding index is 'intermediate risk'.  Positive CHADS2 values include History of HTN, Age > 65 years old, and History of Diabetes.  The start date was 02/18/2001.  His last INR was 2.  Anticoagulation responsible Daine Gunther: Eden Emms MD, Theron Arista.  INR POC: 3.  Cuvette Lot#: [id number].  Exp: 01/2011.    Anticoagulation Management Assessment/Plan:      The patient's current anticoagulation dose is Warfarin sodium 4 mg tabs: Use as directed by Anticoagulation Clinic.  The target INR is 2 - 3.  The next INR is due 02/28/2010.  Anticoagulation instructions were given to patient.  Results were reviewed/authorized by Lyna Poser PharmD.         Prior Anticoagulation Instructions: INR 2.5  Continue same dose of 1 tablet every day except 1 1/2 tablets on Monday and Friday.  Recheck INR in 3 weeks.   Current Anticoagulation Instructions: INR 3 Continue taking 1.5 tablets on monday and friday. And 1 tablet all other days. Recheck in 4 weeks.

## 2010-04-04 NOTE — Medication Information (Signed)
Summary: rov/mw  Anticoagulant Therapy  Managed by: Bethena Midget, RN, BSN Referring MD: Willa Rough MD PCP: Jacques Navy MD Supervising MD: Jens Som MD, Arlys John Indication 1: Atrial Fibrillation (ICD-427.31) Lab Used: [facility] [place] Site: [address] INR POC 4.5 INR RANGE 2 - 3  Dietary changes: yes       Details: Did increase leafy veggies  Health status changes: no    Bleeding/hemorrhagic complications: no    Recent/future hospitalizations: no    Any changes in medication regimen? yes       Details: Lasix increased to 40mg s daily from 20mg s   Recent/future dental: no  Any missed doses?: no       Is patient compliant with meds? yes      Comments: Pt leaving for cruise to [place] will not be back   Allergies: No Known Drug Allergies  Anticoagulation Management History:      The patient is taking warfarin and comes in today for a routine follow up visit.  Positive risk factors for bleeding include an age of 75 years or older and presence of serious comorbidities.  The bleeding index is 'intermediate risk'.  Positive CHADS2 values include History of HTN, Age > 82 years old, and History of Diabetes.  The start date was 02/18/2001.  His last INR was 2.  Anticoagulation responsible provider: Jens Som MD, Arlys John.  INR POC: 4.5.  Cuvette Lot#: [id number].  Exp: 04/2011.    Anticoagulation Management Assessment/Plan:      The patient's current anticoagulation dose is Warfarin sodium 4 mg tabs: Use as directed by Anticoagulation Clinic.  The target INR is 2 - 3.  The next INR is due 04/08/2010.  Anticoagulation instructions were given to patient.  Results were reviewed/authorized by Bethena Midget, RN, BSN.  He was notified by Bethena Midget, RN, BSN.         Prior Anticoagulation Instructions: INR 3 Continue taking 1.5 tablets on monday and friday. And 1 tablet all other days. Recheck in 4 weeks.   Current Anticoagulation Instructions: INR 4.5 Skip  Thursdays dose then on Friday on take 4mg s, then resume 4mg s daily except 6mg s on Mondays and Fridays. Recheck in 6 weeks.

## 2010-04-04 NOTE — Medication Information (Signed)
Summary: rov/eh  Anticoagulant Therapy  Managed by: Eda Keys, PharmD Referring MD: Willa Rough MD PCP: Jacques Navy MD Supervising MD: Gala Romney MD, Reuel Boom Indication 1: Atrial Fibrillation (ICD-427.31) Lab Used: [facility] [place] Site: [address] INR POC 2.4 INR RANGE 2 - 3  Dietary changes: no    Health status changes: no    Bleeding/hemorrhagic complications: no    Recent/future hospitalizations: no    Any changes in medication regimen? no    Recent/future dental: no  Any missed doses?: no       Is patient compliant with meds? yes      Comments: Patient to be leaving for 2 mo trip on Mar 12th, have scheduled appts accordingly.  Current Medications (verified): 1)  Pantoprazole Sodium 40 Mg Tbec (Pantoprazole Sodium) .[name] Kitchen.. 1 By Mouth Qam 2)  Celebrex 200 Mg  Caps (Celecoxib) .... Once Daily 3)  Lipitor 20 Mg  Tabs (Atorvastatin Calcium) .... Q Pm 4)  Verapamil Hcl Cr 240 Mg  Tbcr (Verapamil Hcl) .... Once Daily 5)  Zolpidem Tartrate 5 Mg Tabs (Zolpidem Tartrate) .[name] Kitchen.. 1 By Mouth At Bedtime 6)  Actos 45 Mg Tabs (Pioglitazone Hcl) .[name] Kitchen.. 1 Once Daily 7)  Allopurinol 300 Mg  Tabs (Allopurinol) .... Once Daily 8)  Doxazosin Mesylate 4 Mg Tabs (Doxazosin Mesylate) .... 2 Once Daily 9)  Senokot 8.6 Mg  Tabs (Sennosides) .... As Needed 10)  Oncovite   Tabs (Multiple Vitamins-Minerals) .... Take 1 Tablet By Mouth Once A Day 11)  Finasteride 5 Mg  Tabs (Finasteride) .... Take 1 Tablet By Mouth Once A Day 12)  Warfarin Sodium 4 Mg Tabs (Warfarin Sodium) .... Use As Directed By Anticoagulation Clinic 13)  Furosemide 20 Mg Tabs (Furosemide) .... Take Two Tablet By Mouth The First 3 Days Then,take One Tablet By Mouth Daily. 14)  Potassium Chloride Crys Cr 20 Meq Cr-Tabs (Potassium Chloride Crys Cr) .... Take One Tablet By Mouth Daily  Allergies (verified): No Known Drug Allergies  Anticoagulation Management History:      The patient is taking  warfarin and comes in today for a routine follow up visit.  Positive risk factors for bleeding include an age of 75 years or older and presence of serious comorbidities.  The bleeding index is 'intermediate risk'.  Positive CHADS2 values include History of HTN, Age > 75 years old, and History of Diabetes.  The start date was 02/18/2001.  Anticoagulation responsible provider: Damilola Flamm MD, Reuel Boom.  INR POC: 2.4.  Cuvette Lot#: [id number].  Exp: 06/2010.    Anticoagulation Management Assessment/Plan:      The patient's current anticoagulation dose is Warfarin sodium 4 mg tabs: Use as directed by Anticoagulation Clinic.  The target INR is 2 - 3.  The next INR is due 05/09/2009.  Anticoagulation instructions were given to patient.  Results were reviewed/authorized by Eda Keys, PharmD.  He was notified by Eda Keys.         Prior Anticoagulation Instructions: INR 2.0  Continue on same dosage of 1 tablet daily except 1.5 tablets Mondays, Wednesdays, and Fridays. Recheck in 4 weeks.  Current Anticoagulation Instructions: INR 2.4  Continue current dosing schedule.  Take 1.5 tablets on  Monday, Wednesday, and Friday, and take 1 tablet all other days. Return to clinic prior to leaving for trip.

## 2010-04-04 NOTE — Medication Information (Signed)
Summary: Eric Rivers  Anticoagulant Therapy  Managed by: Bethena Midget, RN Referring MD: Willa Rough MD PCP: Jacques Navy MD Supervising MD: Clifton James MD, Cristal Deer Indication 1: Atrial Fibrillation (ICD-427.31) Lab Used: [facility] [place] Site: [address] INR POC 2.0 INR RANGE 2 - 3  Dietary changes: yes       Details: Has had a few more salads than usual.  Health status changes: no    Bleeding/hemorrhagic complications: no    Recent/future hospitalizations: no    Any changes in medication regimen? no    Recent/future dental: no  Any missed doses?: no       Is patient compliant with meds? yes       Allergies (verified): No Known Drug Allergies  Anticoagulation Management History:      The patient is taking warfarin and comes in today for a routine follow up visit.  Positive risk factors for bleeding include an age of 50 years or older and presence of serious comorbidities.  The bleeding index is 'intermediate risk'.  Positive CHADS2 values include History of HTN, Age > 78 years old, and History of Diabetes.  The start date was 02/18/2001.  Anticoagulation responsible provider: Clifton James MD, Cristal Deer.  INR POC: 2.0.  Cuvette Lot#: [id number].  Exp: 04/2010.    Anticoagulation Management Assessment/Plan:      The patient's current anticoagulation dose is Warfarin sodium 4 mg tabs: Use as directed by Anticoagulation Clinic.  The target INR is 2 - 3.  The next INR is due 04/04/2009.  Anticoagulation instructions were given to patient.  Results were reviewed/authorized by Bethena Midget, RN.  He was notified by Lew Dawes, PharmD Candidate.         Prior Anticoagulation Instructions: INR 2.3  Continue on same dosage 1 tablet daily except 1.5 tablets on Mondays, Wednesdays, and Fridays.  Recheck in 4 weeks.    Current Anticoagulation Instructions: INR 2.0  Continue on same dosage of 1 tablet daily except 1.5 tablets Mondays, Wednesdays, and Fridays.  Recheck in 4 weeks.

## 2010-04-04 NOTE — Progress Notes (Signed)
  Phone Note Refill Request Message from:  Fax from Pharmacy on May 07, 2009 10:58 AM  Refills Requested: Medication #1:  ZOLPIDEM TARTRATE 5 MG TABS 1 by mouth at bedtime   Dosage confirmed as above?Dosage Confirmed   Brand Name Necessary? No   Supply Requested: 3 months Can pt have refill? Please Advise  Initial call taken by: Ami Bullins CMA,  May 07, 2009 10:58 AM  Follow-up for Phone Call        yes Follow-up by: Etta Grandchild MD,  May 07, 2009 11:48 AM    Prescriptions: ZOLPIDEM TARTRATE 5 MG TABS (ZOLPIDEM TARTRATE) 1 by mouth at bedtime  #90 x prn   Entered by:   Ami Bullins CMA   Authorized by:   Jacques Navy MD   Signed by:   Bill Salinas CMA on 05/07/2009   Method used:   Telephoned to ...       [facility]. [phone]* (retail)       [phone]       [place], [place]  [address]       Ph: [phone]       Fax: [phone]   RxID:   [id number]

## 2010-04-04 NOTE — Medication Information (Signed)
Summary: rov/sp  Anticoagulant Therapy  Managed by: Bethena Midget, RN, BSN Referring MD: Willa Rough MD PCP: Jacques Navy MD Supervising MD: Antoine Poche MD, Fayrene Fearing Indication 1: Atrial Fibrillation (ICD-427.31) Lab Used: [facility] [place] Site: [address] INR POC 2.6 INR RANGE 2 - 3  Dietary changes: no    Health status changes: no    Bleeding/hemorrhagic complications: no    Recent/future hospitalizations: no    Any changes in medication regimen? no    Recent/future dental: no  Any missed doses?: no       Is patient compliant with meds? yes       Allergies: No Known Drug Allergies  Anticoagulation Management History:      The patient is taking warfarin and comes in today for a routine follow up visit.  Positive risk factors for bleeding include an age of 74 years or older and presence of serious comorbidities.  The bleeding index is 'intermediate risk'.  Positive CHADS2 values include History of HTN, Age > 29 years old, and History of Diabetes.  The start date was 02/18/2001.  Anticoagulation responsible provider: Antoine Poche MD, Fayrene Fearing.  INR POC: 2.6.  Cuvette Lot#: [id number].  Exp: 10/2010.    Anticoagulation Management Assessment/Plan:      The patient's current anticoagulation dose is Warfarin sodium 4 mg tabs: Use as directed by Anticoagulation Clinic.  The target INR is 2 - 3.  The next INR is due 09/06/2009.  Anticoagulation instructions were given to patient.  Results were reviewed/authorized by Bethena Midget, RN, BSN.  He was notified by Bethena Midget, RN, BSN.         Prior Anticoagulation Instructions: INR-1.9 Take extra 1/2 a tablet today. Resume normal schedule . Take 1.5 tablets on Monday, Wednesday and Friday  take 1 tablet on all other days of the week.  Current Anticoagulation Instructions: INR 2.6 Continue 4mg s everyday except 6mg s on Mondays, Wednesdays, and Fridays. Recheck in 4 weeks.

## 2010-04-04 NOTE — Progress Notes (Signed)
  Phone Note Refill Request Message from:  Fax from Pharmacy on August 23, 2009 9:51 AM  Refills Requested: Medication #1:  ZOLPIDEM TARTRATE 5 MG TABS 1 by mouth at bedtime Please Advise refill  Initial call taken by: Ami Bullins CMA,  August 23, 2009 9:51 AM  Follow-up for Phone Call        ok for refill prn Follow-up by: Jacques Navy MD,  August 23, 2009 5:30 PM    Prescriptions: ZOLPIDEM TARTRATE 5 MG TABS (ZOLPIDEM TARTRATE) 1 by mouth at bedtime  #90 x prn   Entered by:   Bill Salinas CMA   Authorized by:   Jacques Navy MD   Signed by:   Bill Salinas CMA on 08/24/2009   Method used:   Telephoned to ...       [facility]. [phone]* (retail)       [phone]       [place], [place]  [address]       Ph: [phone]       Fax: [phone]   RxID:   [id number]

## 2010-04-04 NOTE — Progress Notes (Signed)
Summary: pt having dental work next week   Phone Note Call from Patient Call back at [employer] [phone]   Caller: Patient Reason for Call: Talk to Nurse, Talk to Doctor Summary of Call: pt wants to talk to someone asap because he has to have dental work next week and he has questions regarding coumadin Initial call taken by: Omer Jack,  April 05, 2009 12:42 PM  Follow-up for Phone Call        Called spoke with pt.  Pt states periodontist states pt should be OK to continue coumadin as long as INR 2-2.5.  INR on 2/2 2.4 and prior to INR was 2.0 in January.  Advised pt if periodontist advised pt bleeding risk was minimal and INR OK to be 2.0-2.5 he should be fine to continue current regimen of coumadin.   Follow-up by: Cloyde Reams RN,  April 06, 2009 9:37 AM

## 2010-04-04 NOTE — Miscellaneous (Signed)
Summary: Appointment Canceled  Appointment status changed to canceled by [employer] on 02/14/2010 9:33 AM.  Cancellation Comments --------------------- echo/fluid overload  Appointment Information ----------------------- Appt Type:  CARDIOLOGY ANCILLARY VISIT      Date:  Wednesday, February 27, 2010      Time:  9:30 AM for 60 min   Urgency:  Routine   Made By:  Hoy Finlay Scheduler  To Visit:  [id number]    Reason:  echo/fluid overload  Appt Comments ------------- -- 02/14/10 9:33: ([facility]) CANCELED -- echo/fluid overload -- 02/13/10 14:30: ([facility]) BOOKED -- Routine CARDIOLOGY ANCILLARY VISIT at 02/27/2010 9:30 AM for 60 min echo/fluid overload

## 2010-04-04 NOTE — Medication Information (Signed)
Summary: Eric Rivers  Anticoagulant Therapy  Managed by: Cloyde Reams, RN, BSN Referring MD: Willa Rough MD PCP: Jacques Navy MD Supervising MD: Eden Emms MD, Theron Arista Indication 1: Atrial Fibrillation (ICD-427.31) Lab Used: [facility] [place] Site: [address] INR POC 3.4 INR RANGE 2 - 3  Dietary changes: no    Health status changes: no    Bleeding/hemorrhagic complications: no    Recent/future hospitalizations: no    Any changes in medication regimen? no    Recent/future dental: no  Any missed doses?: no       Is patient compliant with meds? yes       Allergies: No Known Drug Allergies  Anticoagulation Management History:      The patient is taking warfarin and comes in today for a routine follow up visit.  Positive risk factors for bleeding include an age of 21 years or older and presence of serious comorbidities.  The bleeding index is 'intermediate risk'.  Positive CHADS2 values include History of HTN, Age > 43 years old, and History of Diabetes.  The start date was 02/18/2001.  His last INR was 2.  Anticoagulation responsible provider: Eden Emms MD, Theron Arista.  INR POC: 3.4.  Cuvette Lot#: [id number].  Exp: 01/2011.    Anticoagulation Management Assessment/Plan:      The patient's current anticoagulation dose is Warfarin sodium 4 mg tabs: Use as directed by Anticoagulation Clinic.  The target INR is 2 - 3.  The next INR is due 01/10/2010.  Anticoagulation instructions were given to patient.  Results were reviewed/authorized by Cloyde Reams, RN, BSN.  He was notified by Cloyde Reams RN.         Prior Anticoagulation Instructions: INR 3.3  Take 1/2 tablet tomorrow (Friday). Then resume regular dose of 1 tablet everyday except 1 1/2 tablets on Mondays, Wednesdays, and Fridays. Recheck INR in 3 weeks.   Current Anticoagulation Instructions: INR 3.4  Take 1/2 tablet tomorrow, then start taking 1 tablet daily except 1.5 tablets on Mondays and Fridays.  Recheck in  3 weeks.

## 2010-04-04 NOTE — Letter (Signed)
Summary: [facility]  [facility]   Imported By: Lennie Odor 05/02/2009 14:47:44  _____________________________________________________________________  External Attachment:    Type:   Image     Comment:   External Document

## 2010-04-04 NOTE — Medication Information (Signed)
Summary: ROV coumadin/mw  Anticoagulant Therapy  Managed by: Cloyde Reams, RN, BSN Referring MD: Willa Rough MD PCP: Jacques Navy MD Supervising MD: Shirlee Latch MD, Dalton Indication 1: Atrial Fibrillation (ICD-427.31) Lab Used: [facility] [place] Site: [address] INR POC 3.3 INR RANGE 2 - 3  Dietary changes: yes       Details: Few more drinks the past week than normal  Health status changes: no    Bleeding/hemorrhagic complications: no    Recent/future hospitalizations: no    Any changes in medication regimen? no    Recent/future dental: no  Any missed doses?: no       Is patient compliant with meds? yes      Comments: Pt has already had dose today  Allergies: No Known Drug Allergies  Anticoagulation Management History:      The patient is taking warfarin and comes in today for a routine follow up visit.  Positive risk factors for bleeding include an age of 80 years or older and presence of serious comorbidities.  The bleeding index is 'intermediate risk'.  Positive CHADS2 values include History of HTN, Age > 56 years old, and History of Diabetes.  The start date was 02/18/2001.  His last INR was 2.  Anticoagulation responsible provider: Shirlee Latch MD, Dalton.  INR POC: 3.3.  Cuvette Lot#: [id number].  Exp: 01/2011.    Anticoagulation Management Assessment/Plan:      The patient's current anticoagulation dose is Warfarin sodium 4 mg tabs: Use as directed by Anticoagulation Clinic.  The target INR is 2 - 3.  The next INR is due 12/20/2009.  Anticoagulation instructions were given to patient.  Results were reviewed/authorized by Cloyde Reams, RN, BSN.  He was notified by Harrel Carina, PharmD candidate.         Prior Anticoagulation Instructions: INR 2 Continue taking 1 tablet on sunday, tuesday, and thursday, and saturday. Take 1.5 tablets on Monday, wednesday, and friday. September 29 at 10:15   Current Anticoagulation Instructions: INR 3.3  Take  1/2 tablet tomorrow (Friday). Then resume regular dose of 1 tablet everyday except 1 1/2 tablets on Mondays, Wednesdays, and Fridays. Recheck INR in 3 weeks.

## 2010-04-04 NOTE — Medication Information (Signed)
Summary: rov/ewj   Anticoagulant Therapy  Managed by: Weston Brass, PharmD Referring MD: Willa Rough MD PCP: Jacques Navy MD Supervising MD: Antoine Poche MD, Fayrene Fearing Indication 1: Atrial Fibrillation (ICD-427.31) Lab Used: [facility] [place] Site: [address] INR POC 2.5 INR RANGE 2 - 3  Dietary changes: no    Health status changes: no    Bleeding/hemorrhagic complications: no    Recent/future hospitalizations: no    Any changes in medication regimen? yes       Details: taking extra strength tylenol  Recent/future dental: no  Any missed doses?: no       Is patient compliant with meds? yes       Allergies: No Known Drug Allergies  Anticoagulation Management History:      The patient is taking warfarin and comes in today for a routine follow up visit.  Positive risk factors for bleeding include an age of 75 years or older and presence of serious comorbidities.  The bleeding index is 'intermediate risk'.  Positive CHADS2 values include History of HTN, Age > 77 years old, and History of Diabetes.  The start date was 02/18/2001.  His last INR was 2.  Anticoagulation responsible provider: Antoine Poche MD, Fayrene Fearing.  INR POC: 2.5.  Exp: 01/2011.    Anticoagulation Management Assessment/Plan:      The patient's current anticoagulation dose is Warfarin sodium 4 mg tabs: Use as directed by Anticoagulation Clinic.  The target INR is 2 - 3.  The next INR is due 01/31/2010.  Anticoagulation instructions were given to patient.  Results were reviewed/authorized by Weston Brass, PharmD.  He was notified by Weston Brass PharmD.         Prior Anticoagulation Instructions: INR 3.4  Take 1/2 tablet tomorrow, then start taking 1 tablet daily except 1.5 tablets on Mondays and Fridays.  Recheck in 3 weeks.   Current Anticoagulation Instructions: INR 2.5  Continue same dose of 1 tablet every day except 1 1/2 tablets on Monday and Friday.  Recheck INR in 3 weeks.

## 2010-04-08 ENCOUNTER — Encounter (INDEPENDENT_AMBULATORY_CARE_PROVIDER_SITE_OTHER): Payer: Medicare Other

## 2010-04-08 ENCOUNTER — Encounter: Payer: Self-pay | Admitting: Cardiology

## 2010-04-08 DIAGNOSIS — I4891 Unspecified atrial fibrillation: Secondary | ICD-10-CM

## 2010-04-08 DIAGNOSIS — Z7901 Long term (current) use of anticoagulants: Secondary | ICD-10-CM

## 2010-04-08 LAB — CONVERTED CEMR LAB: POC INR: 1.7

## 2010-04-18 ENCOUNTER — Encounter: Payer: Self-pay | Admitting: Internal Medicine

## 2010-04-18 ENCOUNTER — Ambulatory Visit (INDEPENDENT_AMBULATORY_CARE_PROVIDER_SITE_OTHER): Payer: Medicare Other | Admitting: Internal Medicine

## 2010-04-18 DIAGNOSIS — E8779 Other fluid overload: Secondary | ICD-10-CM

## 2010-04-18 DIAGNOSIS — I10 Essential (primary) hypertension: Secondary | ICD-10-CM

## 2010-04-18 DIAGNOSIS — I4891 Unspecified atrial fibrillation: Secondary | ICD-10-CM

## 2010-04-18 DIAGNOSIS — Z7901 Long term (current) use of anticoagulants: Secondary | ICD-10-CM

## 2010-04-18 DIAGNOSIS — E119 Type 2 diabetes mellitus without complications: Secondary | ICD-10-CM

## 2010-04-18 NOTE — Medication Information (Signed)
Summary: Coumadin Clinic  Anticoagulant Therapy  Managed by: Bethena Midget, RN, BSN Referring MD: Willa Rough MD PCP: Jacques Navy MD Supervising MD: Jens Som MD, Arlys John Indication 1: Atrial Fibrillation (ICD-427.31) Lab Used: [facility] [place] Site: [address] INR POC 1.7 INR RANGE 2 - 3  Dietary changes: yes       Details: Ate extra leafy veggies  Health status changes: no    Bleeding/hemorrhagic complications: yes       Details: Did have a fall while on cruise ship and cut his finger was checked out by ship doctor,   Recent/future hospitalizations: no    Any changes in medication regimen? no    Recent/future dental: no  Any missed doses?: no       Is patient compliant with meds? yes      Comments: Has appt with Dr Myrtis Ser on 05/10/10 and wants to combine the 2 appts.   Allergies: No Known Drug Allergies  Anticoagulation Management History:      The patient is taking warfarin and comes in today for a routine follow up visit.  Positive risk factors for bleeding include an age of 75 years or older and presence of serious comorbidities.  The bleeding index is 'intermediate risk'.  Positive CHADS2 values include History of HTN, Age > 6 years old, and History of Diabetes.  The start date was 02/18/2001.  His last INR was 2.  Anticoagulation responsible provider: Jens Som MD, Arlys John.  INR POC: 1.7.  Cuvette Lot#: [id number].  Exp: 03/2011.    Anticoagulation Management Assessment/Plan:      The patient's current anticoagulation dose is Warfarin sodium 4 mg tabs: Use as directed by Anticoagulation Clinic.  The target INR is 2 - 3.  The next INR is due 05/10/2010.  Anticoagulation instructions were given to patient.  Results were reviewed/authorized by Bethena Midget, RN, BSN.  He was notified by Bethena Midget, RN, BSN.         Prior Anticoagulation Instructions: INR 4.5 Skip Thursdays dose then on Friday on take 4mg s, then resume 4mg s daily except 6mg s on Mondays and  Fridays. Recheck in 6 weeks.   Current Anticoagulation Instructions: INR 1.7 Today take extra 2mg s the resume 4mg s daily except 6mg s on Mondays and Fridays. Recheck in 4 weeks.

## 2010-04-24 NOTE — Assessment & Plan Note (Signed)
Summary: M20/PER MD/JSS   Vital Signs:  Patient profile:   77 year old male Height:      68 inches Weight:      248 pounds BMI:     37.84 O2 Sat:      97 % on Room air Temp:     97.6 degrees F oral Pulse rate:   71 / minute Resp:     14 per minute BP sitting:   124 / 68  (left arm)  Vitals Entered By: Eric Rivers CMA(AAMA) (April 18, 2010 10:54 AM)  O2 Flow:  Room air CC: Pt here for 2 mth F/U for fluid retention and lasix meds/sls,cma Is Patient Diabetic? Yes   Primary Care Provider:  Jacques Navy MD  CC:  Pt here for 2 mth F/U for fluid retention and lasix meds/sls and cma.  History of Present Illness: Eric Rivers returns for follow-up. IN the interval he has made a [employer] during which he had a small laceration 3rd digit right hand. No sutures required. He is feeling well and medically stable. No new complaints.   Current Medications (verified): 1)  Pantoprazole Sodium 40 Mg Tbec (Pantoprazole Sodium) .[name] Kitchen.. 1 By Mouth Qam 2)  Celebrex 200 Mg  Caps (Celecoxib) .... Once Daily 3)  Lipitor 20 Mg  Tabs (Atorvastatin Calcium) .... Q Pm 4)  Verapamil Hcl Cr 240 Mg  Tbcr (Verapamil Hcl) .... Once Daily 5)  Zolpidem Tartrate 5 Mg Tabs (Zolpidem Tartrate) .[name] Kitchen.. 1 By Mouth At Bedtime 6)  Actos 45 Mg Tabs (Pioglitazone Hcl) .[name] Kitchen.. 1 Once Daily 7)  Allopurinol 300 Mg  Tabs (Allopurinol) .... Once Daily 8)  Doxazosin Mesylate 4 Mg Tabs (Doxazosin Mesylate) .... 2 Once Daily 9)  Senokot 8.6 Mg  Tabs (Sennosides) .... As Needed 10)  Oncovite   Tabs (Multiple Vitamins-Minerals) .... Take 1 Tablet By Mouth Once A Day 11)  Finasteride 5 Mg  Tabs (Finasteride) .... Take 1 Tablet By Mouth Once A Day 12)  Warfarin Sodium 4 Mg Tabs (Warfarin Sodium) .... Use As Directed By Anticoagulation Clinic 13)  Furosemide 40 Mg Tabs (Furosemide) .[name] Kitchen.. 1 By Mouth Once Daily 14)  Potassium Chloride Crys Cr 20 Meq Cr-Tabs (Potassium Chloride Crys Cr) .... Take One Tablet By Mouth  Daily  Allergies (verified): No Known Drug Allergies  Past History:  Past Medical History: Last updated: 03/19/2010 UCD, whooping cough, scarlitina bladder cancer-TCCA, recurrence with TUR-B March '09 Atrial fibrillation.[name] KitchenMarland KitchenChronic  /  24-hour Holter, September, 2011.... atrial fib rate is controlled... there is some bradycardia but no marked pauses Diabetes mellitus, type II Hypertension DDD lumbar spine GERD Coumadin Rx Gout Fluid overload ACE Inhibitor cough Shortness of breath Aortic stenosis... mild...echo.. September... 2010 EF 60%.[name] Kitchen echo September, 2010 Nuclear.[name] KitchenMarland Kitchen4/2007..no ischemia ABI...2007.[name] KitchenMarland KitchenWNL  Past Surgical History: Last updated: 06/07/2007 Lumbar laminectomy-'02 TUR-BT '06, '09 FH reviewed for relevance, SH/Risk Factors reviewed for relevance  Review of Systems  The patient denies anorexia, fever, weight loss, weight gain, decreased hearing, hoarseness, syncope, dyspnea on exertion, prolonged cough, abdominal pain, melena, severe indigestion/heartburn, incontinence, muscle weakness, difficulty walking, depression, enlarged lymph nodes, and angioedema.    Physical Exam  General:  Mildly overweight white man in no distress Head:  normocephalic and atraumatic.   Eyes:  vision grossly intact, pupils equal, and pupils round.   Neck:  supple and full ROM.   Chest Wall:  no deformities.   Lungs:  normal respiratory effort, normal breath sounds, no crackles, and no wheezes.   Heart:  IRIR rate with good rate control. Abdomen:  obese, soft, non-tender Msk:  normal ROM, no joint tenderness, no joint swelling, and no joint deformities.   Pulses:  2+ radial Neurologic:  alert & oriented X3, cranial nerves II-XII intact, and gait normal.   Skin:  turgor normal, color normal, no rashes, and no suspicious lesions.   Cervical Nodes:  no anterior cervical adenopathy and no posterior cervical adenopathy.   Psych:  Oriented X3, memory intact for recent and remote,  normally interactive, and good eye contact.     Impression & Recommendations:  Problem # 1:  FLUID OVERLOAD (ICD-276.6) appears stable. Follows the "rule of 2's"  Plan - continue present meds.  Problem # 2:  COUMADIN THERAPY (ICD-V58.61) stable. Informed him of the availability of Xarelto which he can discuss with the coag clinic as to whether it is appropriate treatment for him.  Problem # 3:  HYPERTENSION (ICD-401.9)  His updated medication list for this problem includes:    Verapamil Hcl Cr 240 Mg Tbcr (Verapamil hcl) ..... Once daily    Doxazosin Mesylate 4 Mg Tabs (Doxazosin mesylate) .[name] Kitchen... 2 once daily    Furosemide 40 Mg Tabs (Furosemide) .[name] Kitchen... 1 by mouth once daily  BP today: 124/68 Prior BP: 102/70 (02/11/2010)  Labs Reviewed: K+: 3.8 (01/31/2010) Creat: : 0.9 (01/31/2010)   Chol: 111 (01/31/2010)   HDL: 44.40 (01/31/2010)   LDL: 55 (01/31/2010)   TG: 56.0 (01/31/2010)  Good control on present regimen. Will continue the same.  Problem # 4:  DIABETES MELLITUS, TYPE II (ICD-250.00)  His updated medication list for this problem includes:    Actos 45 Mg Tabs (Pioglitazone hcl) .[name] Kitchen... 1 once daily  Labs Reviewed: Creat: 0.9 (01/31/2010)     Last Eye Exam: normal (09/13/2009) Reviewed HgBA1c results: 6.0 (01/31/2010)  6.1 (08/10/2009)  Doing well. Due for A1C in March  Problem # 5:  ATRIAL FIBRILLATION (ICD-427.31) Stable rate.  His updated medication list for this problem includes:    Verapamil Hcl Cr 240 Mg Tbcr (Verapamil hcl) ..... Once daily    Warfarin Sodium 4 Mg Tabs (Warfarin sodium) ..... Use as directed by anticoagulation clinic  Complete Medication List: 1)  Pantoprazole Sodium 40 Mg Tbec (Pantoprazole sodium) .[name] Kitchen.. 1 by mouth qam 2)  Celebrex 200 Mg Caps (Celecoxib) .... Once daily 3)  Lipitor 20 Mg Tabs (Atorvastatin calcium) .... Q pm 4)  Verapamil Hcl Cr 240 Mg Tbcr (Verapamil hcl) .... Once daily 5)  Zolpidem Tartrate 5 Mg Tabs (Zolpidem  tartrate) .[name] Kitchen.. 1 by mouth at bedtime 6)  Actos 45 Mg Tabs (Pioglitazone hcl) .[name] Kitchen.. 1 once daily 7)  Allopurinol 300 Mg Tabs (Allopurinol) .... Once daily 8)  Doxazosin Mesylate 4 Mg Tabs (Doxazosin mesylate) .... 2 once daily 9)  Senokot 8.6 Mg Tabs (Sennosides) .... As needed 10)  Oncovite Tabs (Multiple vitamins-minerals) .... Take 1 tablet by mouth once a day 11)  Finasteride 5 Mg Tabs (Finasteride) .... Take 1 tablet by mouth once a day 12)  Warfarin Sodium 4 Mg Tabs (Warfarin sodium) .... Use as directed by anticoagulation clinic 13)  Furosemide 40 Mg Tabs (Furosemide) .[name] Kitchen.. 1 by mouth once daily 14)  Potassium Chloride Crys Cr 20 Meq Cr-tabs (Potassium chloride crys cr) .... Take one tablet by mouth daily Prescriptions: FUROSEMIDE 40 MG TABS (FUROSEMIDE) 1 by mouth once daily  #90 x 3   Entered and Authorized by:   Jacques Navy MD   Signed by:   Jacques Navy  MD on 04/18/2010   Method used:   Electronically to        [facility]. [phone]* (retail)       [phone]       [place], [place]  [address]       Ph: [phone]       Fax: [phone]   RxID:   [id number]    Orders Added: 1)  Est. Patient Level III [63016]

## 2010-05-06 ENCOUNTER — Encounter (INDEPENDENT_AMBULATORY_CARE_PROVIDER_SITE_OTHER): Payer: Self-pay | Admitting: *Deleted

## 2010-05-06 ENCOUNTER — Other Ambulatory Visit: Payer: Self-pay | Admitting: Internal Medicine

## 2010-05-06 ENCOUNTER — Other Ambulatory Visit: Payer: Medicare Other

## 2010-05-06 DIAGNOSIS — E119 Type 2 diabetes mellitus without complications: Secondary | ICD-10-CM

## 2010-05-06 DIAGNOSIS — E8779 Other fluid overload: Secondary | ICD-10-CM

## 2010-05-06 LAB — BASIC METABOLIC PANEL
BUN: 15 mg/dL (ref 6–23)
CO2: 30 mEq/L (ref 19–32)
Calcium: 8.9 mg/dL (ref 8.4–10.5)
Creatinine, Ser: 0.9 mg/dL (ref 0.4–1.5)
Glucose, Bld: 144 mg/dL — ABNORMAL HIGH (ref 70–99)

## 2010-05-07 ENCOUNTER — Encounter: Payer: Self-pay | Admitting: Internal Medicine

## 2010-05-09 ENCOUNTER — Encounter: Payer: Self-pay | Admitting: Cardiology

## 2010-05-09 ENCOUNTER — Telehealth: Payer: Self-pay | Admitting: Internal Medicine

## 2010-05-09 DIAGNOSIS — I359 Nonrheumatic aortic valve disorder, unspecified: Secondary | ICD-10-CM

## 2010-05-09 DIAGNOSIS — I4891 Unspecified atrial fibrillation: Secondary | ICD-10-CM

## 2010-05-10 ENCOUNTER — Encounter: Payer: Self-pay | Admitting: Cardiology

## 2010-05-10 ENCOUNTER — Encounter (INDEPENDENT_AMBULATORY_CARE_PROVIDER_SITE_OTHER): Payer: Medicare Other

## 2010-05-10 ENCOUNTER — Ambulatory Visit (INDEPENDENT_AMBULATORY_CARE_PROVIDER_SITE_OTHER): Payer: Medicare Other | Admitting: Cardiology

## 2010-05-10 DIAGNOSIS — I4891 Unspecified atrial fibrillation: Secondary | ICD-10-CM

## 2010-05-10 DIAGNOSIS — Z7901 Long term (current) use of anticoagulants: Secondary | ICD-10-CM

## 2010-05-14 NOTE — Progress Notes (Signed)
Summary: F/u  Phone Note From Other Clinic   Summary of Call: PER MD - pt wanted to know if he should f/u w/MD before of after cruise. Per MD, after if ok.  Initial call taken by: Lamar Sprinkles, CMA,  May 09, 2010 4:24 PM  Follow-up for Phone Call        Pt informed  Follow-up by: Lamar Sprinkles, CMA,  May 10, 2010 5:32 PM

## 2010-05-14 NOTE — Medication Information (Signed)
Summary: rov/tm  Anticoagulant Therapy  Managed by: Bethena Midget, RN, BSN Referring MD: Willa Rough MD PCP: Jacques Navy MD Supervising MD: Riley Kill MD, Maisie Fus Indication 1: Atrial Fibrillation (ICD-427.31) Lab Used: [facility] [place] Site: [address] INR POC 2.9 INR RANGE 2 - 3  Dietary changes: no    Health status changes: no    Bleeding/hemorrhagic complications: no    Recent/future hospitalizations: no    Any changes in medication regimen? no    Recent/future dental: no  Any missed doses?: no       Is patient compliant with meds? yes       Allergies: No Known Drug Allergies  Anticoagulation Management History:      The patient is taking warfarin and comes in today for a routine follow up visit.  Positive risk factors for bleeding include an age of 75 years or older and presence of serious comorbidities.  The bleeding index is 'intermediate risk'.  Positive CHADS2 values include History of HTN, Age > 75 years old, and History of Diabetes.  The start date was 02/18/2001.  His last INR was 2.  Anticoagulation responsible provider: Riley Kill MD, Maisie Fus.  INR POC: 2.9.  Cuvette Lot#: [id number].  Exp: 05/2011.    Anticoagulation Management Assessment/Plan:      The patient's current anticoagulation dose is Warfarin sodium 4 mg tabs: Use as directed by Anticoagulation Clinic.  The target INR is 2 - 3.  The next INR is due 06/06/2010.  Anticoagulation instructions were given to patient.  Results were reviewed/authorized by Bethena Midget, RN, BSN.  He was notified by Bethena Midget, RN, BSN.         Prior Anticoagulation Instructions: INR 1.7 Today take extra 2mg s the resume 4mg s daily except 6mg s on Mondays and Fridays. Recheck in 4 weeks.   Current Anticoagulation Instructions: INR 2.9 Continue 4mg s daily except 6mg  on Mondays and Fridays. Recheck in 4 weeks.

## 2010-05-14 NOTE — Letter (Signed)
   [facility] [address] [place], [place]  [address] Phone: [phone]      May 07, 2010   [name] [address] [place], [place] [address]  RE:  LAB RESULTS  Dear  [name],  The following is an interpretation of your most recent lab tests.  Please take note of any instructions provided or changes to medications that have resulted from your lab work.  ELECTROLYTES:  Good - no changes needed  KIDNEY FUNCTION TESTS:  Good - no changes needed   DIABETIC STUDIES:  Excellent - no changes needed Blood Glucose: 144   HgbA1C: 6.2   Microalbumin/Creatinine Ratio: 15.7       GOOD JOB. KEEP UP THE GOOD WORK.   Sincerely Yours,    Jacques Navy MD  Patient: Eric Rivers Note: All result statuses are Final unless otherwise noted.  Tests: (1) BMP (METABOL)   Sodium                    137 mEq/L                   135-145   Potassium                 3.8 mEq/L                   3.5-5.1   Chloride                  100 mEq/L                   96-112   Carbon Dioxide            30 mEq/L                    19-32   Glucose              [H]  144 mg/dL                   29-56   BUN                       15 mg/dL                    2-13   Creatinine                0.9 mg/dL                   0.8-6.5   Calcium                   8.9 mg/dL                   7.8-46.9   GFR                       82.53 mL/min                >60.00  Tests: (2) Hemoglobin A1C (A1C)   Hemoglobin A1C            6.2 %                       4.6-6.5

## 2010-05-21 NOTE — Assessment & Plan Note (Signed)
Summary: f15m/jml/per debbie    Visit Type:  Follow-up Primary Provider:  Jacques Navy MD  CC:  fluid overload.  History of Present Illness: Patient is seen for followup of volume overload.  He has good left ventricular function.  He's been followed very carefully by Dr.Norins.  He is on how to adjust his diuretics at home.  Current Medications (verified): 1)  Pantoprazole Sodium 40 Mg Tbec (Pantoprazole Sodium) .[name] Kitchen.. 1 By Mouth Qam 2)  Celebrex 200 Mg  Caps (Celecoxib) .... Once Daily 3)  Lipitor 20 Mg  Tabs (Atorvastatin Calcium) .... Q Pm 4)  Verapamil Hcl Cr 240 Mg  Tbcr (Verapamil Hcl) .... Once Daily 5)  Zolpidem Tartrate 5 Mg Tabs (Zolpidem Tartrate) .[name] Kitchen.. 1 By Mouth At Bedtime 6)  Actos 45 Mg Tabs (Pioglitazone Hcl) .[name] Kitchen.. 1 Once Daily 7)  Allopurinol 300 Mg  Tabs (Allopurinol) .... Once Daily 8)  Doxazosin Mesylate 4 Mg Tabs (Doxazosin Mesylate) .... 2 Once Daily 9)  Senokot 8.6 Mg  Tabs (Sennosides) .... As Needed 10)  Oncovite   Tabs (Multiple Vitamins-Minerals) .... Take 1 Tablet By Mouth Once A Day 11)  Finasteride 5 Mg  Tabs (Finasteride) .... Take 1 Tablet By Mouth Once A Day 12)  Warfarin Sodium 4 Mg Tabs (Warfarin Sodium) .... Use As Directed By Anticoagulation Clinic 13)  Furosemide 40 Mg Tabs (Furosemide) .[name] Kitchen.. 1 By Mouth Once Daily 14)  Potassium Chloride Crys Cr 20 Meq Cr-Tabs (Potassium Chloride Crys Cr) .... Take One Tablet By Mouth Daily  Allergies: No Known Drug Allergies  Past History:  Past Medical History: UCD, whooping cough, scarlitina bladder cancer-TCCA, recurrence with TUR-B March '09 Atrial fibrillation.[name] KitchenMarland KitchenChronic  /  24-hour Holter, September, 2011.... atrial fib rate is controlled... there is some bradycardia but no marked pauses Diabetes mellitus, type II Hypertension DDD lumbar spine GERD Coumadin Rx Gout Fluid overload ACE Inhibitor cough Shortness of breath Aortic stenosis... mild...echo.. September... 2010  /  mild... echo...  December, 2011 Pulmonary hypertension    44 mmHg... echo... December, 2011 EF 60%.[name] Kitchen echo September, 2010  /  EF 55-60%... echo... December, 2011 Nuclear.[name] KitchenMarland Kitchen4/2007..no ischemia ABI...2007.[name] KitchenMarland KitchenWNL  Review of Systems       Patient denies fever, chills, headache, sweats, rash, change in vision, change in hearing, chest pain, cough, nausea vomiting, urinary symptoms.  All of the systems are reviewed and are negative  Vital Signs:  Patient profile:   77 year old male Height:      68 inches Weight:      250 pounds BMI:     38.15 Pulse rate:   62 / minute Resp:     14 per minute BP sitting:   145 / 88  (left arm)  Vitals Entered By: Kem Parkinson (May 10, 2010 10:49 AM)  Physical Exam  General:  patient is overweight for quite stable. Eyes:  no xanthelasma. Neck:  no jugular venous distention. Lungs:  lungs are clear.  Respiratory effort is nonlabored. Heart:  cardiac exam reveals S1-S2.  The rhythm is irregularly irregular  Abdomen:  abdomen is protuberant but soft. Extremities:  no peripheral edema. Psych:  patient is oriented to person time and place.  Affect is normal.   Impression & Recommendations:  Problem # 1:  * LV FUNCTION  .  NORMAL Recent echo showed that his LV function remains normal.  Problem # 2:  FLUID OVERLOAD (ICD-276.6) His volume status is stable.  No change in therapy.  He adjusts his own diuretics as needed.  Recent chemistry was checked and was normal.  Problem # 3:  COUMADIN THERAPY (ICD-V58.61) I have talked with him about other newer anticoagulants.  In his case he is quite stable and I prefer to leave him on Coumadin for now.  We may change his meds later.  Problem # 4:  ATRIAL FIBRILLATION (ICD-427.31)  Atrial fibrillation controlled.  No change in therapy. EKG is done today reviewed by me.  There is atrial fibrillation with controlled rate.  Orders: EKG w/ Interpretation ([id number])  Patient Instructions: 1)  Your physician recommends that you  schedule a follow-up appointment in: 6 months with Dr. Myrtis Ser 2)  Your physician recommends that you continue on your current medications as directed. Please refer to the Current Medication list given to you today.

## 2010-05-29 ENCOUNTER — Ambulatory Visit: Payer: Medicare Other | Admitting: Cardiology

## 2010-06-06 ENCOUNTER — Ambulatory Visit (INDEPENDENT_AMBULATORY_CARE_PROVIDER_SITE_OTHER): Payer: Medicare Other | Admitting: *Deleted

## 2010-06-06 DIAGNOSIS — I359 Nonrheumatic aortic valve disorder, unspecified: Secondary | ICD-10-CM

## 2010-06-06 DIAGNOSIS — Z7901 Long term (current) use of anticoagulants: Secondary | ICD-10-CM

## 2010-06-06 DIAGNOSIS — I4891 Unspecified atrial fibrillation: Secondary | ICD-10-CM

## 2010-06-06 LAB — POCT INR: INR: 2.3

## 2010-06-06 NOTE — Patient Instructions (Addendum)
INR 2.3 Continue taking 1 tablet everyday, except 1.5 tablets on Monday and Friday. Recheck in 4 weeks.

## 2010-06-08 ENCOUNTER — Other Ambulatory Visit: Payer: Self-pay | Admitting: Internal Medicine

## 2010-06-15 ENCOUNTER — Other Ambulatory Visit: Payer: Self-pay | Admitting: Internal Medicine

## 2010-06-15 ENCOUNTER — Other Ambulatory Visit: Payer: Self-pay | Admitting: Cardiology

## 2010-07-02 ENCOUNTER — Other Ambulatory Visit: Payer: Self-pay | Admitting: Internal Medicine

## 2010-07-03 NOTE — Telephone Encounter (Signed)
Please Advise refill 

## 2010-07-04 ENCOUNTER — Other Ambulatory Visit: Payer: Self-pay | Admitting: Internal Medicine

## 2010-07-04 NOTE — Telephone Encounter (Signed)
Ok to refill prn.

## 2010-07-08 ENCOUNTER — Ambulatory Visit (INDEPENDENT_AMBULATORY_CARE_PROVIDER_SITE_OTHER): Payer: Medicare Other | Admitting: *Deleted

## 2010-07-08 ENCOUNTER — Telehealth: Payer: Self-pay | Admitting: *Deleted

## 2010-07-08 DIAGNOSIS — I4891 Unspecified atrial fibrillation: Secondary | ICD-10-CM

## 2010-07-08 DIAGNOSIS — I359 Nonrheumatic aortic valve disorder, unspecified: Secondary | ICD-10-CM

## 2010-07-08 NOTE — Telephone Encounter (Signed)
Ok to refill x 5 

## 2010-07-08 NOTE — Telephone Encounter (Signed)
Pt called req RF, called in and pt informed

## 2010-07-08 NOTE — Telephone Encounter (Signed)
Zolpidem Tartrate 5 mg tablets, SIG one tablet by mouth at bedtime. Last fill 04/27/2010 Fax from [facility] [phone]. Please Advise refills

## 2010-07-10 MED ORDER — ZOLPIDEM TARTRATE 5 MG PO TABS: 5.0000 mg | ORAL_TABLET | Freq: Every evening | ORAL | Status: DC | PRN

## 2010-07-10 NOTE — Telephone Encounter (Signed)
Med called into [facility]

## 2010-07-16 NOTE — Op Note (Signed)
NAME:  MCCORMICK, MACON NO.:  [id number]   MEDICAL RECORD NO.:  [id number]          PATIENT TYPE:  AMB   LOCATION:  [facility]                         FACILITY:  [facility]   PHYSICIAN:  Boston Service, M.D.DATE OF BIRTH:  August 17, 1930   DATE OF PROCEDURE:  04/13/2007  DATE OF DISCHARGE:                               OPERATIVE REPORT   PREOPERATIVE DIAGNOSIS:  Benign prostatic hypertrophy with a family  history of prostate cancer.  Careful PSA follow-up over the last several  years, 3.5 to 4 F/T of about 20%, minimal criteria for urolithiasis,  tiny calculi lower pole left kidney and transitional cell carcinoma.  Transurethral resection of bladder tumor March 22, 2004, high-grade  noninvasive urothelial carcinoma, follow-up deep biopsies May 27, 2004, fibrosis inflammation, no evidence of residual carcinoma.  Recent  fiberoptic cystoscopy shows papillary reoccurrence along the posterior  wall.  Initial metastatic survey, chest x-ray, CT abdomen and pelvis  showed no extravesical disease.   POSTOPERATIVE DIAGNOSIS:  Same.   PROCEDURE:  Cystoscopy, retrograde transurethral resection of bladder  tumor, endoscopic photographs taken.   SURGEON:  Boston Service, M.D.   ASSISTANT:  None.   ANESTHESIA:  General.   FINDINGS:  Bladder tumor, specimens x5 sent to pathology.   ESTIMATED BLOOD LOSS:  Minimal.   COMPLICATIONS:  None obvious.   DESCRIPTION OF PROCEDURE:  The patient was prepped and draped in the  dorsal lithotomy position after institution of adequate level of general  anesthesia.  Well lubricated 21-French panendoscope gently inserted at  the urethral meatus, normal urethra and sphincter, hyperemic prostate.  Orifices were closed to the trigone.  Single papillary lesion along the  posterior wall.  Endoscopic photographs were taken.  Blocking catheter  was then selected.   Right and left retrogrades were performed.  Significant J deformity the  distal  ureter but no evidence of filling defect or obstruction.  Fine  and delicate calyces with prompt drainage at about 3-5 minutes.  was resected.  Lesion was resected along the posterior bladder wall,  total of about five chips obtained, taking care to include at least some  underlying muscle.  Loop replaced with a VaporTrode element used to  obtain adequate  hemostasis. VaporTrode was then removed, replaced with a 22-French Foley  which was irrigated with sterile water for a period of about 15 or 20  minutes and B&O suppository was given.  The patient was returned to  recovery in satisfactory condition.           ______________________________  Boston Service, M.D.     RH/MEDQ  D:  04/13/2007  T:  04/14/2007  Job:  [id number]   cc:   Rosalyn Gess. Norins, MD  [address]  [place]  [place] [address]

## 2010-07-16 NOTE — Assessment & Plan Note (Signed)
[facility]                            CARDIOLOGY OFFICE NOTE   ABDIRAHIM, FLAVELL                    MRN:          [id number]  DATE:08/27/2006                            DOB:          19-Apr-1930    Mr. Kobus is seen for cardiology followup.  He is really doing well.  I encouraged him, of course, to follow all the recommendations through  Dr. Debby Bud.  His diabetes treatment and weight status are of great  importance for his long term health.  He does have atrial fibrillation.  His rate has been controlled.  He does not have significant symptoms.  He is on Coumadin.  He is stable in this regard.  He does not have chest  pain or shortness of breath.  There is no syncope or presyncope.   PAST MEDICAL HISTORY:   ALLERGIES:  No known drug allergies.   MEDICATIONS:  Prevacid, Celebrex, Lipitor, Coumadin, Detrol LA,  verapamil, Ambien, Actos, Senokot, allopurinol, Flomax, Lisinopril, and  Lanoxin, and p.r.n. colchicine.   OTHER MEDICAL PROBLEMS:  See the list below.   REVIEW OF SYSTEMS:  His review of systems at this time is negative and  he is stable and feeling well.   PHYSICAL EXAMINATION:  VITAL SIGNS:  His weight is 222 pounds and this  is decreasing from last year, blood pressure is 150/82, and his pulse  rate is 50.  MENTAL STATUS: The patient is oriented to person, time, and place.  Affect is normal.  HEENT:  Reveals no xanthelasma.  He has normal extraocular motion.  There are no carotid bruits.  There is no jugular venous distention.  CARDIAC:  Reveals an S1 with an S2.  There is a soft systolic murmur.  ABDOMEN:  Soft.  There are no masses or bruits.  EXTREMITIES:  He has no peripheral edema.  There are no musculoskeletal  deformities.   EKG reveals atrial fibrillation with a slow rate.  He does not have any  significant symptoms.  He has nonspecific ST-T wave changes that have  been present in the past.   PROBLEM LIST:  1.  Borderline hypertension and this is to be followed through Dr.      Debby Bud.  2. Lumbar disk disease.  3. Gastroesophageal reflux disease.  4. History of rheumatic fever but no proof of valvular disease in the      past.  His last echocardiogram was done in 2002.  I still feel that      we do not need an echocardiogram at this time.  5. Chronic atrial fibrillation.  Rate is controlled.  He is on      Coumadin.  We discussed all aspects of this and there is no change.  6. Coumadin therapy.  7. History of gout.   Mr. Lozinski is stable.  No change in his therapy and I will see him back  in a year.     Luis Abed, MD, [facility]  Electronically Signed    JDK/MedQ  DD: 08/27/2006  DT: 08/27/2006  Job #: [id number]   cc:   Rosalyn Gess. Norins,  MD

## 2010-07-16 NOTE — Assessment & Plan Note (Signed)
[facility]                            CARDIOLOGY OFFICE NOTE   Eric Rivers                    MRN:          [id number]  DATE:10/06/2007                            DOB:          May 04, 1930    Mr. Eric Rivers is doing well.  He has chronic atrial fibrillation.  He  tends to have a slow rate.  He is on verapamil.  However, he does quite  well with this, and I have decided not to change his medications.  He is  going about full activities.  He unfortunately has had some continued  bladder problems which are being managed by Dr. Aldean Ast.  Otherwise,  he is stable.   PAST MEDICAL HISTORY:   ALLERGIES:  No known drug allergies.   MEDICATIONS:  Celebrex, Lipitor, Coumadin, verapamil 240, Ambien, Actos,  Senokot, allopurinol, and lisinopril.   OTHER MEDICAL PROBLEMS:  See the list below.   REVIEW OF SYSTEMS:  He is stable.  He told me about his bladder  procedures and about some urinary retention that he had.  Unfortunately,  when he removed the catheter for the first time after bladder retention,  there was some bleeding and he required catheter for a longer period of  time, but this is stabilized.  Otherwise, review of systems is negative.   PHYSICAL EXAMINATION:  Blood pressure is 150/78 today.  His pulse is 45.  Weight is 229 pounds.  The patient is oriented to person, time, and  place.  Affect is normal.  HEENT reveals no xanthelasma.  He has normal  extraocular motion.  There are no carotid bruits.  There is no jugular  venous distention.  Lungs are clear.  Respiratory effort is not labored.  Cardiac exam reveals an S1 and S2.  There are no clicks or significant  murmurs.  His rhythm is irregularly irregular and the rate is slow.  His  abdomen is obese, but soft.  He has no peripheral edema.   EKG reveals atrial fib with a slow rate.   Problems include,  1. Hypertension.  In general, it is under control.  2. Lumbar disk disease.  3.  Gastroesophageal reflux disease.  4. History of rheumatic fever, but no proof of valvular disease in the      past and there are no significant murmurs at this time.  Next year,      I believe we want to proceed with a 2-D echo.  5. Chronic atrial fibrillation.  The rate is controlled.  His resting      rate is quite slow.  However, he has no symptomatology of      symptomatic bradycardia, and I have chosen not to change his      medicines.  He is on Coumadin.  6. Coumadin therapy.  7. History of gout.  His status is stable.     Luis Abed, MD, [facility]  Electronically Signed    JDK/MedQ  DD: 10/06/2007  DT: 10/07/2007  Job #: [id number]   cc:   Rosalyn Gess. Norins, MD  Courtney Paris, M.D.

## 2010-07-19 NOTE — Assessment & Plan Note (Signed)
[facility]                           PRIMARY CARE OFFICE NOTE   Eric Rivers, Eric Rivers                    MRN:          [id number]  DATE:06/03/2006                            DOB:          Feb 27, 1931    Mr. Thivierge is a 77 year old gentleman followed for multiple medical  problems who presents today for follow up evaluation and exam.  The  patient was last seen November 04, 2005.  In the interval he reports  that he has been doing well.  He has developed some minor discomfort in  the right posterior cervical area, which is intermittent.  There is some  radiation to the occipital parietal region on the right.  This is of  minor discomfort, does not limit his activities.   PAST SURGICAL HISTORY:  1. L4, 5 diskectomy and laminotomy by Julio Sicks October 20, 2006.  2. Trans-urethral resection of bladder tumor in 2006 for transitional      cell carcinoma.   PAST MEDICAL HISTORY:  1. The patient had the usual childhood diseases including scarlatina.      He was diagnosed in 1957 as having rheumatic fever on his service      two dose of high dose Penicillin.  The patient reports having had      evaluation at [facility] for a slight murmur      consistent with all rheumatic fever.  2. Paroxysmal atrial fibrillation.  3. Non insulin dependent diabetes.  4. Hypertension.  5. Transitional cell carcinoma of the bladder.  6. Benign prostatic hypertrophy.  7. Gastroesophageal reflux disease.  8. Gout.   CURRENT MEDICATIONS:  1. Prevacid 30 mg daily.  2. Trazodone 5 mg daily.  3. Celebrex 200 mg daily.  4. Lipitor 20 mg daily.  5. Coumadin as directed.  6. Detrol LA 4 mg daily.  7. Digoxin 250 mcg daily.  8. Triamterene/hctz  37.5/25 mg once daily.  9. Diltia  ER 240 mg daily.  10.Ambien 10 mg nightly.  11.Actos 30 mg daily.  12.Allopurinol 300 mg daily.  13.Senokot 1 tablet daily.  14.Multivitamin daily.   PHYSICIAN ROOSTER:  Dr.  Jordan Likes for neurosurgery, Dr. Myrtis Ser for cardiology,  Dr. Nile Riggs for ophthalmology, Dr. Wanda Plump for urology.   FAMILY HISTORY:  Father had CVA, mother had coronary artery disease,  father succumbed to prostatic cancer at a old age.   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  He is retired.  He lives in [place], but  travels extensively.  Next trip coming up is going up the [place]  seaboard to [place] in May.  He has another cruise booked in November  to the [place] of [place].  He just came back from a [place] cruise in  December.   REVIEW OF SYSTEMS:  The patient has had no constitutional problems, no  ophthalmology, ENT, cardiovascular, respiratory, GI problems.  GU, he is  currently up to date with Dr. Wanda Plump and he does report he is  continuing to have nocturia x2-3 sometimes more.  He reports nocturia q.  Hour for the first several hours of sleep.  No musculoskeletal,  dermatological, neurological complaints.   EXAMINATION:  Temperature was 97.8, blood pressure 145/81, pulse 48,  weight 236.  GENERAL APPEARANCE:  This is a heavy set Caucasian gentleman who looks  his stated age in no acute distress.  HEENT:  Normocephalic, atraumatic, EAC's and TM's were unremarkable,  oropharynx with native dentition in good repair, no buccal or pallet  lesions were noted, posterior pharynx was clear, conjunctivae and  sclerae was clear, funduscopic exam deferred to ophthalmology.  NECK:  Supple without thyromegaly, no cervical adenopathy was noted in  the cervical supraclavicular region.  CHEST:  With CVA tenderness.  LUNGS:  Clear to auscultation and percussion.  CARDIOVASCULAR:  Have 2+ radial pulse, no jugular venous distension, no  carotid bruits.  He had a quiet pericardium with a irregular, irregular  rhythm that was rate controlled.  ABDOMEN:  Obese, soft, no guarding, no rebound, no organo splenomegaly  was noted.   RECTAL/GENITALIA:  Deferred to Dr. Wanda Plump.  EXTREMITIES:  Without clubbing, cyanosis, edema or deformity.  NEUROLOGIC:  Non focal.   DATABASE:  The patient had a elevated micro albumin at 2.3, slightly  overtop of normal at 1.9.  Cholesterol was 95, triglycerides 123, HDL  was 32.2, LDL was 38, SGOT and SGPT were normal.  BNP revealed a  chloride of 104, which is normal.  Sodium and potassium were normal,  serum glucose was elevated at 165, kidney function was normal with a BUN  of 14 and creatinine of 0.8, hemoglobin A1c with elevated 8.3%.   ASSESSMENT/PLAN:  1. Hypertension.  The patient is borderline control on his present      medical regimen.  Would have him continue on his present      medications, but work on a weight loss program with regular      exercise and calorie restrictions.  2. Cardiovascular.  Patient with stable paroxysmal atrial      fibrillation.  He continues with anticoagulation.  He is followed      with Dr. Myrtis Ser and would be seeing him in June for follow up and at      that time it will be determined what appropriate studies would be      needed.  3. Diabetes.  The patient is very poorly controlled.  Last hemoglobin      A1c from September of 2007 was 6.7%.  The patient has been on a      nice cruise, has been indiscrete with his diet.  Given that the      patient has done well on his present regimen in the past, we will      keep his medications the same, which is Actos 30 mg daily.  I am      going to have him work on diet and exercise therapy.  We will get a      hemoglobin A1c in 3 months.  If it continues to be above the goal      at 7% would add a second agent.  4. Lipids.  The patient has excellent control of his cholesterol on      his present medical regimen and he will continue the same.  5. Benign prostatic hypertrophy with nocturia.  The patient is given a     trial of Flomax 0.4 mg nightly to replace Trazodone.  Samples were      provided.  If  he has better results in  regards to less nocturia, we      will make this a permanent change.  6. Urology.  The patient with TC CA.  He does follow with Dr.      Wanda Plump on a regular basis and is doing well.  7. Musculoskeletal.  The patient is on Celebrex 200 mg daily for mild      osteoarthritic type discomfort.  I suspect some of his neck pain is      related to a similar process.  Presently, he is not limited in his      activities by his discomfort.  Plan is to continue the same      treatment.  Again, weight loss associated with exercise will      benefit this discomfort.  8. Gastrointestinal.  The patient is currently stable on Prevacid.  9. Health maintenance.  The patient's last colonoscopy was March of      2004 and was stable.  He was advised to return in 2011.  The      patient is up to date with Dr. Wanda Plump in regards to prostate      health.  The patient is give pneumonia vaccine and a tetanus      booster at today's visit.  We are asking him to inquire with his      insurance carrier in regards to coverage for Zostavax.   In summary, a very delightful gentleman who unfortunately has had his  diabetes slip out of control.  He will attend to this with diet and  exercise and continuing Actos with follow up laboratory in 3 months as  noted.     Rosalyn Gess Norins, MD  Electronically Signed    MEN/MedQ  DD: 06/04/2006  DT: 06/04/2006  Job #: [id number]   cc:   Judeth Horn, M.D.  Luis Abed, MD, [facility]

## 2010-07-19 NOTE — Discharge Summary (Signed)
NAME:  Eric Rivers, Eric Rivers NO.:  [id number]   MEDICAL RECORD NO.:  [id number]          PATIENT TYPE:  INP   LOCATION:  0367                         FACILITY:  [facility]   PHYSICIAN:  Boston Service, M.D.DATE OF BIRTH:  09-07-1930   DATE OF ADMISSION:  05/07/2004  DATE OF DISCHARGE:  05/08/2004                                 DISCHARGE SUMMARY   Indications, medications, allergies, tobacco, EtOH past medical history,  social history, physical exam and review of systems all outlined in the  admitting note.   The patient admitted 6 weeks prior for TURBT. Pathology report: High-grade  papillary transitional cell carcinoma artifact at the edge of the biopsy  foci suspicious for stromal invasion cannot be excluded in this material.  Plans were made for repeat cystoscopy under anesthesia with bladder muscle  biopsy and TUR biopsy of muscle at the bladder tumor base.   HOSPITAL COURSE:  The patient was admitted, underwent bladder biopsy and TUR  of muscle at the bladder base. He had been off Coumadin for about 6 days  prior to the procedure. The patient had a pleasantly uneventful  postoperative recovery. By postoperative day #1, Foley catheter was removed.  The patient voided on his own x3-4. Urine was pink without clots. Pathology  report pending at the time of discharge. Discharge medications included  Macrobid, Phenergan, and Vicodin.   PLAN:  The patient will see [place] back in the office in about 3-4 days to  discuss pathology report.      RH/MEDQ  D:  05/22/2004  T:  05/22/2004  Job:  [id number]

## 2010-07-19 NOTE — Op Note (Signed)
NAME:  NIKOLOZ, HUY NO.:  [id number]   MEDICAL RECORD NO.:  [id number]          PATIENT TYPE:  INP   LOCATION:  0008                         FACILITY:  [facility]   PHYSICIAN:  Boston Service, M.D.DATE OF BIRTH:  Jun 01, 1930   DATE OF PROCEDURE:  05/07/2004  DATE OF DISCHARGE:                                 OPERATIVE REPORT   REFERRING PHYSICIAN:  Rosalyn Gess. Norins, M.D.   PREOPERATIVE DIAGNOSIS:  Bladder tumor, high grade, questionable invasive  disease, status post transurethral resection of bladder tumor, March 22, 2004.   Patient brought back to the OR for repeat biopsy and transurethral  resection.   ANESTHESIA:  General.   DRAINS:  A 24 French Foley.   SPECIMENS:  Bladder biopsies and TUR specimens.   DESCRIPTION OF PROCEDURE:  Patient was prepped and draped in the dorsal  lithotomy position after institution of an adequate level of general  anesthesia.  He had been off Coumadin about six days prior to cystoscopy.  The bladder was carefully inspected and showed a well-healing scar along the  posterior bladder wall, consistent with prior TURBT on March 22, 2004.  Findings at that time were papillary transitional cell carcinoma, high  grade.  Foci present.  Suspicious for stromal invasion, which cannot be  excluded on this material.  Endoscopic photographs were taken.  A total of  eight biopsies were taken from the deepest muscular portions of the lesion.  Once representative samples had been obtained, biopsy forceps were removed  and replaced with the TUR loop.  Several fragments of underlying muscle were  then resected, taking care to avoid bladder perforation.  Once  representative samples had been obtained, loop was exchanged for the  VaporTrode element, which was used to establish adequate hemostasis.  The  bladder was filled to capacity.  The resectoscope sheath was withdrawn.  A  24 French Foley was inserted.  The bladder was then patiently  irrigated with  sterile water over a period of about 15-20 minutes.  The catheter was left  to straight drain, and the patient was returned to recovery in satisfactory  condition.      RH/MEDQ  D:  05/07/2004  T:  05/07/2004  Job:  [id number]   cc:   Rosalyn Gess. Norins, M.D. [facility]

## 2010-07-19 NOTE — Op Note (Signed)
NAME:  Eric Rivers, Eric Rivers NO.:  [id number]   MEDICAL RECORD NO.:  [id number]          PATIENT TYPE:  AMB   LOCATION:  [facility]                         FACILITY:  [facility]   PHYSICIAN:  Boston Service, M.D.DATE OF BIRTH:  08/27/1930   DATE OF PROCEDURE:  03/22/2004  DATE OF DISCHARGE:                                 OPERATIVE REPORT   REFERRING PHYSICIAN:  Rosalyn Gess. Norins, M.D.   UROLOGIST:  Boston Service, M.D.   PREOPERATIVE DIAGNOSIS:  Microhematuria with papillary bladder tumor.  Reference is made to office note from March 13, 2004, CT scan from March 15, 2004, chest x-ray from March 20, 2004 and PSA from March 14, 2004.   POSTOPERATIVE DIAGNOSIS:  Microhematuria with papillary bladder tumor.  Reference is made to office note from March 13, 2004, CT scan from March 15, 2004, chest x-ray from March 20, 2004 and PSA from March 14, 2004.   OPERATION PERFORMED:  Cystoscopy, endoscopic photographs, retrogrades,  transurethral resection of bladder tumor (medium).   SURGEON:  Boston Service, M.D.   ANESTHESIA:  General.   DRAINS:  24 French Foley catheter.   DESCRIPTION OF PROCEDURE:  The patient was prepped and draped in the dorsal  lithotomy position after institution of an adequate level of general  anesthesia.  He had been off of Coumadin for about six days.  Careful  inspection of the bladder showed a papillary tumor along the posterior wall  and dome.  It measured 1.7 cm on CT scan. There was a small area of erythema  surrounding the papillary lesion.  Once endoscopic photographs had been  taken, the right and left retrogrades were performed and showed normal  course and caliber of the ureter, pelvis and calices with prompt drainage in  three to five minutes.  Resection was then initiated of the tumor along the  posterior bladder wall taking care to include the papillary tumor as well as  a ring of adjacent erythematous urothelium.   Once adequate samples had been  obtained, adequacy of hemostasis was achieved with VaporTrode element.  No  other  suspicious lesions were identified within the bladder.  Due to the patient's  prior history of Coumadin therapy, a 50 [place] Ainsworth was selected, 15 mL  in a 30 mL balloon.  The patient's catheter was left to straight drain and  the patient was returned to recovery in satisfactory condition.      RH/MEDQ  D:  03/22/2004  T:  03/22/2004  Job:  [id number]   cc:   Rosalyn Gess. Norins, M.D. [facility]

## 2010-07-19 NOTE — Op Note (Signed)
[place]  Patient:    Eric Rivers, Eric Rivers Visit Number: [id number] MRN: [id number]          Service Type: SUR Location: 3000 [id number] Attending Physician:  Donn Pierini Proc. Date: 10/19/00 Adm. Date:  [id number]                             Operative Report  SERVICE:  Neurosurgery.  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  PROCEDURE:  Right L4-5 laminotomy and microdiskectomy.  SURGEON:  Julio Sicks, M.D.  ASSISTANT:  Reinaldo Meeker, M.D.  ANESTHESIA:  General endotracheal anesthesia.  INDICATIONS:  [name] is a 77 year old man with a history of back and right lower extremity pain, paresthesias, weakness, consistent with a rather severe right-sided L4 radiculopathy.  MRI scanning demonstrates a very large right-sided L4-5 disk herniation with a superior fragment causing marked compression of the right-sided L4 nerve root.  The patient has failed conservative management.  We have discussed the options including the possibility of undergoing a lumbar laminotomy and microdiskectomy for hopeful improvement of symptoms.  The patient wishes to proceed with surgery in hopes of improving his symptoms.  DESCRIPTION OF PROCEDURE:  The patient was taken to the operating room and placed on the operating table in the supine position.  After an adequate level of anesthesia was achieved, the patient was turned prone on the Wilson frame and appropriately padded the patients lumbar region and it was shaved and prepped sterilely.  A 10 blade was used to make a linear skin incision overlying the L4-L5 interspace.  This was carried down sharply in the midline and a subperiosteal dissection was then performed exposing the lamina of the facet joints of L4-L5.  A deep self-retaining retractor was placed,  and intraoperative x-rays taken.  The level was confirmed.  The laminotomy was then performed using high speed drill and Kerrison rongeurs to remove the inferior 1/2 of the lamina of L4, and the medial edge of the L4-5 facet joint and the superior remnant of the L5 lamina.  The ligamentum of flavum was then elevated and resected in a piecemeal fashion using Kerrison rongeurs.  The underlying thecal sac and exiting L5 nerve root was identified.  Microcurets were brought into the field and used for microdissection of the thecal sac and exiting L5 nerve root as well as the underlying disk herniation.  The epidural venous plexus coagulated and cut.  Thecal sac and L5 nerve root were mobilized and retracted towards the midline.  The disk space and overlying disk herniation was readily apparent.  This was incised with a 15 blade in a rectangular fashion.  A wide disk space clean out was then achieved using pituitary rongeurs, upward angled pituitary rongeurs, and Epstein curets.  A large amount of superior disk herniation was then encountered and dissected free using dental instruments, blunt nerve hooks and hockey stick. All elements of the superior fragment were completely resected.  The exiting L4 nerve was identified and found to be free of any compression and free of any injury.  At this point a very thoroughly diskectomy had been performed.  There was no evidence of continued compression.  There was no evidence of injury to thecal sac or nerve roots.  The wound was then copiously irrigated with antibiotic solution and Gelfoam was placed topically for hemostasis  which was found to be good.  The microscope and retraction instruments removed.  Hemostasis in the muscles achieved with electrocautery.  Wounds were then closed in layers with Vicryl sutures.  Steri-Strips and sterile dressing were applied.  There were no apparent complications.  The patient tolerated the procedure well and  he returned to the recovery room postoperatively. DD:  10/19/00 TD:  10/20/00 Job: [id number]

## 2010-08-20 ENCOUNTER — Ambulatory Visit (INDEPENDENT_AMBULATORY_CARE_PROVIDER_SITE_OTHER): Payer: Medicare Other | Admitting: *Deleted

## 2010-08-20 ENCOUNTER — Encounter: Payer: Self-pay | Admitting: Internal Medicine

## 2010-08-20 DIAGNOSIS — I4891 Unspecified atrial fibrillation: Secondary | ICD-10-CM

## 2010-08-20 DIAGNOSIS — I359 Nonrheumatic aortic valve disorder, unspecified: Secondary | ICD-10-CM

## 2010-08-21 ENCOUNTER — Other Ambulatory Visit (INDEPENDENT_AMBULATORY_CARE_PROVIDER_SITE_OTHER): Payer: Medicare Other

## 2010-08-21 ENCOUNTER — Encounter: Payer: Self-pay | Admitting: Internal Medicine

## 2010-08-21 ENCOUNTER — Ambulatory Visit (INDEPENDENT_AMBULATORY_CARE_PROVIDER_SITE_OTHER): Payer: Medicare Other | Admitting: Internal Medicine

## 2010-08-21 DIAGNOSIS — I4891 Unspecified atrial fibrillation: Secondary | ICD-10-CM

## 2010-08-21 DIAGNOSIS — M109 Gout, unspecified: Secondary | ICD-10-CM

## 2010-08-21 DIAGNOSIS — E119 Type 2 diabetes mellitus without complications: Secondary | ICD-10-CM

## 2010-08-21 DIAGNOSIS — I482 Chronic atrial fibrillation, unspecified: Secondary | ICD-10-CM

## 2010-08-21 DIAGNOSIS — Z0000 Encounter for general adult medical examination without abnormal findings: Secondary | ICD-10-CM

## 2010-08-21 DIAGNOSIS — C679 Malignant neoplasm of bladder, unspecified: Secondary | ICD-10-CM

## 2010-08-21 DIAGNOSIS — E785 Hyperlipidemia, unspecified: Secondary | ICD-10-CM

## 2010-08-21 DIAGNOSIS — I10 Essential (primary) hypertension: Secondary | ICD-10-CM

## 2010-08-21 DIAGNOSIS — R0609 Other forms of dyspnea: Secondary | ICD-10-CM

## 2010-08-21 LAB — CBC WITH DIFFERENTIAL/PLATELET
Basophils Relative: 0.5 % (ref 0.0–3.0)
Eosinophils Absolute: 0.2 10*3/uL (ref 0.0–0.7)
Eosinophils Relative: 3.2 % (ref 0.0–5.0)
HCT: 38.2 % — ABNORMAL LOW (ref 39.0–52.0)
Lymphs Abs: 1.5 10*3/uL (ref 0.7–4.0)
MCHC: 33.8 g/dL (ref 30.0–36.0)
MCV: 99 fl (ref 78.0–100.0)
Monocytes Absolute: 0.6 10*3/uL (ref 0.1–1.0)
Neutrophils Relative %: 67.8 % (ref 43.0–77.0)
Platelets: 190 10*3/uL (ref 150.0–400.0)
WBC: 7.4 10*3/uL (ref 4.5–10.5)

## 2010-08-21 LAB — COMPREHENSIVE METABOLIC PANEL
ALT: 20 U/L (ref 0–53)
AST: 26 U/L (ref 0–37)
Albumin: 4.1 g/dL (ref 3.5–5.2)
Alkaline Phosphatase: 61 U/L (ref 39–117)
Calcium: 9.1 mg/dL (ref 8.4–10.5)
Chloride: 104 mEq/L (ref 96–112)
Potassium: 4.1 mEq/L (ref 3.5–5.1)
Sodium: 138 mEq/L (ref 135–145)
Total Protein: 6.8 g/dL (ref 6.0–8.3)

## 2010-08-21 LAB — HEPATIC FUNCTION PANEL
ALT: 20 U/L (ref 0–53)
Albumin: 4.1 g/dL (ref 3.5–5.2)
Bilirubin, Direct: 0.2 mg/dL (ref 0.0–0.3)
Total Protein: 6.8 g/dL (ref 6.0–8.3)

## 2010-08-21 LAB — LIPID PANEL
Cholesterol: 104 mg/dL (ref 0–200)
LDL Cholesterol: 51 mg/dL (ref 0–99)
Total CHOL/HDL Ratio: 3

## 2010-08-21 LAB — BRAIN NATRIURETIC PEPTIDE: Pro B Natriuretic peptide (BNP): 250 pg/mL — ABNORMAL HIGH (ref 0.0–100.0)

## 2010-08-21 MED ORDER — METANX 2.8-25-2 MG PO TABS: 1.0000 | ORAL_TABLET | Freq: Two times a day (BID) | ORAL | Status: DC

## 2010-08-21 MED ORDER — ALLOPURINOL 300 MG PO TABS: 300.0000 mg | ORAL_TABLET | Freq: Every day | ORAL | Status: DC

## 2010-08-21 MED ORDER — VERAPAMIL HCL 240 MG (CO) PO TB24: 240.0000 mg | ORAL_TABLET | Freq: Every day | ORAL | Status: DC

## 2010-08-21 MED ORDER — POTASSIUM CHLORIDE CRYS ER 20 MEQ PO TBCR: 20.0000 meq | EXTENDED_RELEASE_TABLET | Freq: Every day | ORAL | Status: DC

## 2010-08-21 MED ORDER — PIOGLITAZONE HCL 45 MG PO TABS: 45.0000 mg | ORAL_TABLET | Freq: Every day | ORAL | Status: DC

## 2010-08-21 MED ORDER — PANTOPRAZOLE SODIUM 40 MG PO TBEC: 40.0000 mg | DELAYED_RELEASE_TABLET | Freq: Every day | ORAL | Status: DC

## 2010-08-21 NOTE — Progress Notes (Signed)
Subjective:    Patient ID: Eric Rivers, male    DOB: 1931/01/25, 77 y.o.   MRN: [id number]  HPI   The patient is here for annual Medicare wellness examination and management of other chronic and acute problems. He does report some increased paresthesia in the right leg more than the left and also minor tingling paresthesia in the fingertips.    The risk factors are reflected in the social history.  The roster of all physicians providing medical care to patient - is listed in the Snapshot section of the chart.  Activities of daily living:  The patient is 100% inedpendent in all ADLs: dressing, toileting, feeding as well as independent mobility  Home safety : The patient has smoke detectors in the home. They wear seatbelts. No firearms at home . There is no violence in the home.   There is no risks for hepatitis, STDs or HIV. There is no history of blood transfusion. They have travel history to infectious disease endemic areas of the world: he has had malaria prophylaxis, yellow fever immunization.  The patient has seen their dentist in the last six month. They have seen their eye doctor in the last year. They deny  any hearing difficulty and have not had audiologic testing in the last year.  They do not  have excessive sun exposure. Discussed the need for sun protection: hats, long sleeves and use of sunscreen if there is significant sun exposure.   Diet: the importance of a healthy diet is discussed. They do have a healthy, diabetic diet most of the time.  The patient does not do regular exercise program: .  The benefits of regular aerobic exercise were discussed.  Depression screen: there are no signs or vegative symptoms of depression- irritability, change in appetite, anhedonia, sadness/tearfullness.  Cognitive assessment: the patient manages all their financial and personal affairs and is actively engaged. They could relate day,date,year and events; recalled 3/3 objects at 3  minutes; performed clock-face test normally.  The following portions of the patient's history were reviewed and updated as appropriate: allergies, current medications, past family history, past medical history,  past surgical history, past social history  and problem list.  Vision, hearing, body mass index were assessed and reviewed.   During the course of the visit the patient was educated and counseled about appropriate screening and preventive services including : fall prevention , diabetes screening, nutrition counseling, colorectal cancer screening, and recommended immunizations.  Past Medical History  Diagnosis Date  . Whooping cough   . Bladder cancer     recurrence with TUR-B March '09  . Chronic atrial fibrillation     24 hour holter, September, 2011.... atrial fib rate is controlled.... there is some bradycardia but  no marked pauses  . Diabetes mellitus     type 2  . Hypertension   . DDD (degenerative disc disease)     lumbar spine  . GERD (gastroesophageal reflux disease)   . Gout   . Fluid overload   . ACE-inhibitor cough   . Shortness of breath   . Aortic stenosis     mild....echo... september... 2010/ mild... echo.[name] KitchenMarland KitchenDec, 2011  . Pulmonary hypertension   . Hyperlipidemia    Past Surgical History  Procedure Date  . Lumbar laminectomy     '02  . Tur-bt '06, '09    No family history on file. History   Social History  . Marital Status: Single    Spouse Name: N/A    Number  of Children: 0  . Years of Education: 55   Occupational History  . teacher     retired   Social History Main Topics  . Smoking status: Former Smoker    Types: Cigarettes    Quit date: 03/04/1975  . Smokeless tobacco: Never Used  . Alcohol Use: 2.5 oz/week    5 drink(s) per week  . Drug Use: No  . Sexually Active: Not Currently   Other Topics Concern  . Not on file   Social History Narrative   Confirmed batchelor. Retired Engineer, site. World traveler- Geologist, engineering. I- ADLS.  End of life CareNo CPR, no prolonged intubation, no prolonged artificial hydration or feeding, no heroic or futile measures.        Review of Systems Review of Systems  Constitutional:  Negative for fever, chills, activity change and unexpected weight change.  HEENT:  Negative for hearing loss, ear pain, congestion, neck stiffness and postnasal drip. Negative for sore throat or swallowing problems. Negative for dental complaints.   Eyes: Negative for vision loss or change in visual acuity.  Respiratory: Negative for chest tightness and wheezing. Positive for SOB, DOE  Cardiovascular: Negative for chest pain and palpitation. No decreased exercise tolerance Gastrointestinal: No change in bowel habit. No bloating or gas. No reflux or indigestion Genitourinary: Negative for urgency, frequency, flank pain and difficulty urinating.  Musculoskeletal: Negative for myalgias, back pain, arthralgias and gait problem.  Neurological: Negative for dizziness, tremors, weakness and headaches.  Hematological: Negative for adenopathy.  Psychiatric/Behavioral: Negative for behavioral problems and dysphoric mood.       Objective:   Physical Exam Vital signs reviewed Gen'l: Well nourished well developed overweight white male in no acute distress  HEENT:  Head: Normocephalic and atraumatic.  Right Ear: External ear normal. EAC/TM nl - cerumen easily irrigated Left Ear: External ear normal.  EAC/TM nl Nose: Nose normal.  Mouth/Throat: Oropharynx is clear and moist. Dentition - native, in good repair. No buccal or palatal lesions. Posterior pharynx clear. Eyes: Conjunctivae and sclera clear. EOM intact. Pupils are equal, round, and reactive to light. Right eye exhibits no discharge. Left eye exhibits no discharge. Neck: Normal range of motion. Neck supple. No JVD present. No tracheal deviation present. No thyromegaly present.  Cardiovascular: Normal rate, regular rhythm, no gallop, no friction rub, no  murmur heard.      Quiet precordium. 2+ radial and DP pulses . No carotid bruits Pulmonary/Chest: Effort normal. No respiratory distress or increased WOB, no wheezes, no rales. No chest wall deformity or CVAT. Abdominal: Soft. Bowel sounds are normal in all quadrants. He exhibits no distension, no tenderness, no rebound or guarding. Genitourinary:  deferred to GU Musculoskeletal: Normal range of motion. He exhibits no edema and no tenderness.       Small and large joints without redness, synovial thickening or deformity. Full range of motion preserved about all small, median and large joints.  Lymphadenopathy:    He has no cervical or supraclavicular adenopathy.  Neurological: He is alert and oriented to person, place, and time. CN II-XII intact. DTRs 2+ and symmetrical biceps, radial and patellar tendons. Cerebellar function normal with no tremor, rigidity, normal gait and station. Normal 3 word recall. Normal clock face exercise. Skin: Skin is warm and dry. No rash noted. No erythema.  Psychiatric: He has a normal mood and affect. His behavior is normal. Thought content normal.         Assessment & Plan:

## 2010-08-22 DIAGNOSIS — Z0000 Encounter for general adult medical examination without abnormal findings: Secondary | ICD-10-CM | POA: Insufficient documentation

## 2010-08-22 NOTE — Assessment & Plan Note (Signed)
Good control  BP Readings from Last 3 Encounters:  08/21/10 132/80  05/10/10 145/88  04/18/10 124/68    Plan continue present medications.

## 2010-08-22 NOTE — Assessment & Plan Note (Signed)
Interval history is normal. Physical exam is unremarkable. He is current with urology and prostate screening. Last colonoscopy '04 - normal. Immunization: tetanus Jan '10; Pneumonia vaccine April '08; Shingles vaccine Sept '11  In summary - a very nice man who appears to be medically stable. He is strongly advised to develop a regular exercise program - EVERY DAY. He will return in 6 months, sooner if needed.

## 2010-08-22 NOTE — Assessment & Plan Note (Signed)
No EKG today. On exam cannot differentiate rate controlled a.fib from NSR with PVCs. He is hemodynamically stable and continues on his medical regimen. He does follow with Dr. Myrtis Ser for cardiology.

## 2010-08-22 NOTE — Assessment & Plan Note (Signed)
No recent flares. Uric acid in normal range at 3.8  Plan - continue allopurinol

## 2010-08-22 NOTE — Assessment & Plan Note (Signed)
Lab Results  Component Value Date   CHOL 104 08/21/2010   CHOL 111 01/31/2010   CHOL 90 07/28/2008   Lab Results  Component Value Date   HDL 37.60* 08/21/2010   HDL 44.40 01/31/2010   HDL 19.14* 07/28/2008   Lab Results  Component Value Date   LDLCALC 51 08/21/2010   LDLCALC 55 01/31/2010   LDLCALC 44 07/28/2008   Lab Results  Component Value Date   TRIG 76.0 08/21/2010   TRIG 56.0 01/31/2010   TRIG 60.0 07/28/2008   Lab Results  Component Value Date   CHOLHDL 3 08/21/2010   CHOLHDL 3 01/31/2010   CHOLHDL 3 07/28/2008   No results found for this basename: LDLDIRECT   Excellent control on present medication. No change

## 2010-08-22 NOTE — Assessment & Plan Note (Signed)
Lab Results  Component Value Date   HGBA1C 6.3 08/21/2010   HGBA1C 6.2 05/06/2010   HGBA1C 6.0 01/31/2010   Lab Results  Component Value Date   MICROALBUR 2.3* 06/03/2006   LDLCALC 51 08/21/2010   CREATININE 0.8 08/21/2010   Doing well on his present regimen. He does have mild peripheral neuropathy in a stocking-glove distribution but very minimal in the finger tips. He does have well-preserved light touch and pin-prick sensation in his feet but decreased deep vibratory sensation right worse than left ( had disk surgery and right has had persistent change in sensation).  Plan - continue present regimen.

## 2010-08-22 NOTE — Assessment & Plan Note (Signed)
Has had close follow-up with urology. He recently did have cystoscopy that was unremarkable.  Plan - follow -up with GU as instructed - will meet his new urologist.

## 2010-09-17 ENCOUNTER — Encounter (INDEPENDENT_AMBULATORY_CARE_PROVIDER_SITE_OTHER): Payer: Medicare Other | Admitting: *Deleted

## 2010-09-17 DIAGNOSIS — I4891 Unspecified atrial fibrillation: Secondary | ICD-10-CM

## 2010-09-17 DIAGNOSIS — I359 Nonrheumatic aortic valve disorder, unspecified: Secondary | ICD-10-CM

## 2010-10-02 ENCOUNTER — Telehealth: Payer: Self-pay | Admitting: Cardiology

## 2010-10-02 NOTE — Telephone Encounter (Signed)
Per [facility], Dr. Javier Docker office. Pt is scheduled to have surgery August 8. Wanting to know if pt can stop coumadin for 5 days prior to the surgery . Please return call to no later than tomorrow evening to inform if pt can indeed stop coumadin in order to proceed with the scheduled surgery.

## 2010-10-02 NOTE — Telephone Encounter (Signed)
Left message for selita, dr Myrtis Ser is not back in the office until Friday. Will forward for his review Deliah Goody

## 2010-10-03 NOTE — Telephone Encounter (Signed)
Okay to hold Coumadin

## 2010-10-07 NOTE — Telephone Encounter (Signed)
Left message for selita of pts clearance to hold coumadin prior to surgery Eric Rivers

## 2010-10-11 ENCOUNTER — Encounter: Payer: Self-pay | Admitting: Cardiology

## 2010-10-15 ENCOUNTER — Encounter: Payer: Medicare Other | Admitting: *Deleted

## 2010-10-15 ENCOUNTER — Ambulatory Visit
Admission: RE | Admit: 2010-10-15 | Discharge: 2010-10-15 | Disposition: A | Discharge status: Home / Self Care | Payer: Medicare Other | Source: Ambulatory Visit | Attending: Urology | Admitting: Urology

## 2010-10-15 ENCOUNTER — Other Ambulatory Visit: Payer: Self-pay | Admitting: Urology

## 2010-10-15 DIAGNOSIS — Z01811 Encounter for preprocedural respiratory examination: Secondary | ICD-10-CM

## 2010-10-15 LAB — BASIC METABOLIC PANEL
CO2: 30 mEq/L (ref 19–32)
Chloride: 100 mEq/L (ref 96–112)
Potassium: 4.2 mEq/L (ref 3.5–5.1)
Sodium: 138 mEq/L (ref 135–145)

## 2010-10-15 LAB — APTT: aPTT: 37 seconds (ref 24–37)

## 2010-10-16 ENCOUNTER — Other Ambulatory Visit: Payer: Self-pay | Admitting: Urology

## 2010-10-16 ENCOUNTER — Ambulatory Visit (HOSPITAL_BASED_OUTPATIENT_CLINIC_OR_DEPARTMENT_OTHER)
Admission: RE | Admit: 2010-10-16 | Discharge: 2010-10-16 | Disposition: A | Discharge status: Home / Self Care | Payer: Medicare Other | Source: Ambulatory Visit | Attending: Urology | Admitting: Urology

## 2010-10-16 DIAGNOSIS — Z8551 Personal history of malignant neoplasm of bladder: Secondary | ICD-10-CM | POA: Insufficient documentation

## 2010-10-16 DIAGNOSIS — Z01812 Encounter for preprocedural laboratory examination: Secondary | ICD-10-CM | POA: Insufficient documentation

## 2010-10-16 HISTORY — PX: OTHER SURGICAL HISTORY: SHX169

## 2010-10-16 LAB — GLUCOSE, CAPILLARY: Glucose-Capillary: 124 mg/dL — ABNORMAL HIGH (ref 70–99)

## 2010-10-16 LAB — POCT HEMOGLOBIN-HEMACUE: Hemoglobin: 14.6 g/dL (ref 13.0–17.0)

## 2010-10-18 ENCOUNTER — Telehealth: Payer: Self-pay | Admitting: *Deleted

## 2010-10-18 NOTE — Telephone Encounter (Signed)
Called patient - stop actos. Come by [facility] for DDP4 - will see what samples we have

## 2010-10-18 NOTE — Op Note (Signed)
NAME:  Eric Rivers, HACK NO.:  [id number]  MEDICAL RECORD NO.:  [id number]  LOCATION:                                 FACILITY:  PHYSICIAN:  Natalia Leatherwood, MD    DATE OF BIRTH:  [birth year]  DATE OF PROCEDURE:  10/16/2010 DATE OF DISCHARGE:                              OPERATIVE REPORT   PREOPERATIVE DIAGNOSIS:  History of bladder cancer.  POSTOPERATIVE DIAGNOSIS:  History of bladder cancer.  PROCEDURE PERFORMED:  Cystourethroscopy, mapping bladder biopsies, prostatic urethral biopsy, and bilateral retrograde pyelogram.  ESTIMATED BLOOD LOSS:  Minimal.  SPECIMEN:  Bladder biopsy specimens from the posterior bladder base, posterior bladder dome, right lateral bladder wall, left lateral bladder wall, and one prostatic urethral biopsy were all sent for Pathology separately.  COMPLICATIONS:  None.  HISTORY OF PRESENT ILLNESS:  This is a pleasant 77 year old gentleman who has a history of high grade nonmuscle invasive bladder cancer.  He recently had some abnormal cytologies for surveillance and as he did not have any bladder lesions on office cystoscopy.  I have recommended that we do mapping, bladder biopsies, and retrograde pyelograms to evaluate for any possible cancerous lesions in his bladder.  He presents today for that procedure.  PROCEDURE:  Informed consent was obtained.  The patient was taken to the operating room, where he was placed in a supine position.  IV antibiotics were infused and general anesthesia was induced.  He was then placed in dorsal lithotomy position where his genitals were prepped and draped in the usual sterile fashion.  All pertinent neurovascular pressure points were padded appropriately.  Next, a 22-French scope was advanced through the urethra into the bladder with a 12-degree lens. The bladder was evaluated in systematic fashion.  There were no lesions throughout the bladder.  Cold cup biopsy forceps were then placed,  and both ureteral orifices were identified, both seem to be effluxing clear yellow urine.  Biopsies were taken at the posterior bladder floor, the posterior bladder dome,  the right lateral bladder wall, and the left lateral bladder wall.  The bugbee electrode was used to cauterize the biopsy sites.  These sites were well away from the ureteral orifices to avoid injury to the ureter.  Following this, the urethra was evaluated. There were no lesions in the urethra.  A cold cup biopsy forcep was used to take a biopsy of the prostatic urethra.  The electrode was used to fulgurate this biopsy site.  The rest of the urethra remained with no lesions.  A 16-French coude-tipped catheter was placed due to a deep biopsy on the left bladder wall. There was no perforation, but it was thin and he has a large prostete. At the end of procedure, 10 mL of sterile water placed into the balloon.  B and O suppository was placed in the patient's rectum at the end of the procedure. He was then placed in the supine position.  Anesthesia was reversed, and he was taken to PACU in a stable condition.  The patient will be discharged home with Foley catheter in place.  He will follow up in my office for catheter removal.  ______________________________ Natalia Leatherwood, MD     DW/MEDQ  D:  10/16/2010  T:  10/17/2010  Job:  [id number]  Electronically Signed by Natalia Leatherwood MD on 10/18/2010 08:18:43 AM

## 2010-10-18 NOTE — Telephone Encounter (Signed)
Pt called and states his urologist is raising question as to if he should still be on acto with his history of tumors and cancer. Pt would like to speak with MD

## 2010-10-23 ENCOUNTER — Telehealth: Payer: Self-pay | Admitting: *Deleted

## 2010-10-23 MED ORDER — SITAGLIPTIN PHOSPHATE 100 MG PO TABS: 100.0000 mg | ORAL_TABLET | Freq: Every day | ORAL | Status: DC

## 2010-10-23 NOTE — Telephone Encounter (Signed)
See other phone note. MD left samples of januvia 100 once daily, Pt picked up today.

## 2010-10-25 ENCOUNTER — Ambulatory Visit (INDEPENDENT_AMBULATORY_CARE_PROVIDER_SITE_OTHER): Payer: Medicare Other | Admitting: *Deleted

## 2010-10-25 ENCOUNTER — Encounter: Payer: Medicare Other | Admitting: *Deleted

## 2010-10-25 DIAGNOSIS — I4891 Unspecified atrial fibrillation: Secondary | ICD-10-CM

## 2010-10-25 DIAGNOSIS — I359 Nonrheumatic aortic valve disorder, unspecified: Secondary | ICD-10-CM

## 2010-10-25 LAB — POCT INR: INR: 1.5

## 2010-11-06 ENCOUNTER — Encounter: Payer: Self-pay | Admitting: Cardiology

## 2010-11-06 DIAGNOSIS — R05 Cough: Secondary | ICD-10-CM | POA: Insufficient documentation

## 2010-11-06 DIAGNOSIS — K219 Gastro-esophageal reflux disease without esophagitis: Secondary | ICD-10-CM | POA: Insufficient documentation

## 2010-11-06 DIAGNOSIS — E8770 Fluid overload, unspecified: Secondary | ICD-10-CM | POA: Insufficient documentation

## 2010-11-06 DIAGNOSIS — IMO0001 Reserved for inherently not codable concepts without codable children: Secondary | ICD-10-CM | POA: Insufficient documentation

## 2010-11-06 DIAGNOSIS — I272 Pulmonary hypertension, unspecified: Secondary | ICD-10-CM | POA: Insufficient documentation

## 2010-11-06 DIAGNOSIS — I110 Hypertensive heart disease with heart failure: Secondary | ICD-10-CM | POA: Insufficient documentation

## 2010-11-06 DIAGNOSIS — E785 Hyperlipidemia, unspecified: Secondary | ICD-10-CM | POA: Insufficient documentation

## 2010-11-06 DIAGNOSIS — I350 Nonrheumatic aortic (valve) stenosis: Secondary | ICD-10-CM | POA: Insufficient documentation

## 2010-11-06 DIAGNOSIS — T464X5A Adverse effect of angiotensin-converting-enzyme inhibitors, initial encounter: Secondary | ICD-10-CM

## 2010-11-06 DIAGNOSIS — I5032 Chronic diastolic (congestive) heart failure: Secondary | ICD-10-CM

## 2010-11-06 DIAGNOSIS — R9430 Abnormal result of cardiovascular function study, unspecified: Secondary | ICD-10-CM | POA: Insufficient documentation

## 2010-11-06 DIAGNOSIS — Z7901 Long term (current) use of anticoagulants: Secondary | ICD-10-CM | POA: Insufficient documentation

## 2010-11-07 ENCOUNTER — Encounter: Payer: Self-pay | Admitting: Cardiology

## 2010-11-07 ENCOUNTER — Ambulatory Visit (INDEPENDENT_AMBULATORY_CARE_PROVIDER_SITE_OTHER): Payer: Medicare Other | Admitting: *Deleted

## 2010-11-07 ENCOUNTER — Ambulatory Visit (INDEPENDENT_AMBULATORY_CARE_PROVIDER_SITE_OTHER): Payer: Medicare Other | Admitting: Cardiology

## 2010-11-07 DIAGNOSIS — I350 Nonrheumatic aortic (valve) stenosis: Secondary | ICD-10-CM

## 2010-11-07 DIAGNOSIS — I359 Nonrheumatic aortic valve disorder, unspecified: Secondary | ICD-10-CM

## 2010-11-07 DIAGNOSIS — E8779 Other fluid overload: Secondary | ICD-10-CM

## 2010-11-07 DIAGNOSIS — Z7901 Long term (current) use of anticoagulants: Secondary | ICD-10-CM

## 2010-11-07 DIAGNOSIS — I4891 Unspecified atrial fibrillation: Secondary | ICD-10-CM

## 2010-11-07 DIAGNOSIS — I482 Chronic atrial fibrillation, unspecified: Secondary | ICD-10-CM

## 2010-11-07 NOTE — Assessment & Plan Note (Signed)
His aortic stenosis is mild.  He does not need a followup echo at this time.

## 2010-11-07 NOTE — Assessment & Plan Note (Signed)
Patient will continue Coumadin for his atrial fibrillation.

## 2010-11-07 NOTE — Patient Instructions (Signed)
Your physician wants you to follow-up in:  6 months. You will receive a reminder letter in the mail two months in advance. If you don't receive a letter, please call our office to schedule the follow-up appointment.   

## 2010-11-07 NOTE — Progress Notes (Signed)
HPI Patient is seen in followup today to fibrillation and fluid overload.  I saw him last March, 2012.  He has been stable.  He is not having any chest pain.  He has no palpitations his volume status is controlled.  As part of today's evaluation I have carefully updated the patient's record after reviewing my old notes to be sure that the new EMR is up-to-date. No Known Allergies  Current Outpatient Prescriptions  Medication Sig Dispense Refill  . allopurinol (ZYLOPRIM) 300 MG tablet Take 1 tablet (300 mg total) by mouth daily.  90 tablet  3  . atorvastatin (LIPITOR) 20 MG tablet Take 20 mg by mouth daily.        . CELEBREX 200 MG capsule take 1 capsule by mouth once daily  90 capsule  3  . doxazosin (CARDURA) 4 MG tablet TAKE 2 TABLETS BY MOUTH ONCE DAILY  180 tablet  2  . finasteride (PROSCAR) 5 MG tablet Take 5 mg by mouth daily.        . furosemide (LASIX) 40 MG tablet Take 40 mg by mouth daily.        [name] Kitchen L-Methylfolate-B6-B12 (METANX) 2.8-25-2 MG TABS Take 1 tablet by mouth 2 (two) times daily.  180 each  3  . Multiple Vitamins-Minerals (ONCOVITE PO) Take by mouth.        . potassium chloride SA (K-DUR,KLOR-CON) 20 MEQ tablet Take 1 tablet (20 mEq total) by mouth daily.  90 tablet  3  . senna (SENOKOT) 8.6 MG tablet Take 1 tablet by mouth daily.        . sitaGLIPtin (JANUVIA) 100 MG tablet Take 1 tablet (100 mg total) by mouth daily.  30 tablet  0  . verapamil (COVERA HS) 240 MG (CO) 24 hr tablet Take 1 tablet (240 mg total) by mouth at bedtime.  90 tablet  3  . warfarin (COUMADIN) 4 MG tablet take as directed  120 tablet  1  . zolpidem (AMBIEN) 5 MG tablet Take 1 tablet (5 mg total) by mouth at bedtime as needed for sleep.  30 tablet  5    History   Social History  . Marital Status: Single    Spouse Name: N/A    Number of Children: 0  . Years of Education: 32   Occupational History  . teacher     retired   Social History Main Topics  . Smoking status: Former Smoker    Types:  Cigarettes    Quit date: 03/04/1975  . Smokeless tobacco: Never Used  . Alcohol Use: 2.5 oz/week    5 drink(s) per week  . Drug Use: No  . Sexually Active: Not Currently   Other Topics Concern  . Not on file   Social History Narrative   Confirmed batchelor. Retired Engineer, site. World traveler- Geologist, engineering. I- ADLS. End of life CareNo CPR, no prolonged intubation, no prolonged artificial hydration or feeding, no heroic or futile measures.     No family history on file.  Past Medical History  Diagnosis Date  . Whooping cough   . Bladder cancer     recurrence with TUR-B March '09  . Atrial fibrillation      Chronic,   24 hour holter, September, 2011.... atrial fib rate is controlled.... there is some bradycardia but  no marked pauses  . Diabetes mellitus     type 2  . Hypertension   . DDD (degenerative disc disease)     lumbar spine  .  GERD (gastroesophageal reflux disease)   . Gout   . Fluid overload   . ACE-inhibitor cough   . Shortness of breath   . Aortic stenosis     mild....echo... september... 2010/ mild... echo.[name] KitchenMarland KitchenDec, 2011  . Pulmonary hypertension     , echo, 01/2010  . Hyperlipidemia   . Hx of colonoscopy   . Warfarin anticoagulation     Atrial fib  . Ejection fraction     EF 60%, echo, 01/2010  . Normal nuclear stress test     06/2005 , also ABI normal 2007    Past Surgical History  Procedure Date  . Lumbar laminectomy     '02  . Tur-bt '06, '09     ROS  Patient denies fever, chills, headache, sweats, rash, change in vision, change in hearing, chest pain, cough, nausea vomiting, urinary symptoms.  All other systems are reviewed and are negative. PHYSICAL EXAM Patient is stable but overweight.  He is oriented to person time and place.  Affect is normal.  There is no jugular venous distention.  Lungs are clear.  Respiratory effort is nonlabored.  Cardiac exam reveals S1-S2.  No clicks or significant murmurs.  The abdomen is soft.  There is no  peripheral edema.  There no musculoskeletal deformities.  There is no skin rashes. Filed Vitals:   11/07/10 1138  BP: 128/80  Pulse: 72  Height: 5\' 8"  (1.727 m)  Weight: 250 lb (113.399 kg)    EKG Is not today and reviewed by me.  There is atrial fibrillation controlled rate and nonspecific ST-T wave changes.  There is no change from the past.  ASSESSMENT & PLAN

## 2010-11-07 NOTE — Assessment & Plan Note (Signed)
His fluid status is stable.  No change in therapy.

## 2010-11-07 NOTE — Assessment & Plan Note (Signed)
Nature fibrillation is stable.  No change in therapy.

## 2010-11-18 ENCOUNTER — Other Ambulatory Visit: Payer: Self-pay | Admitting: *Deleted

## 2010-11-18 ENCOUNTER — Telehealth: Payer: Self-pay | Admitting: *Deleted

## 2010-11-18 MED ORDER — SITAGLIPTIN PHOSPHATE 100 MG PO TABS: 100.0000 mg | ORAL_TABLET | Freq: Every day | ORAL | Status: DC

## 2010-11-18 NOTE — Telephone Encounter (Signed)
Patient requesting a call back today regarding samples MD gave him at last OV. He says he left VM on MD's personal # but just  found out that Dr Debby Bud is out of office. Unsure name of med, please call patient to clarify.

## 2010-11-22 LAB — POCT I-STAT 4, (NA,K, GLUC, HGB,HCT)
Glucose, Bld: 138 — ABNORMAL HIGH
Hemoglobin: 15.3
Potassium: 3.8

## 2010-11-22 LAB — PROTIME-INR
INR: 1.1
Prothrombin Time: 14.7

## 2010-12-04 ENCOUNTER — Ambulatory Visit (INDEPENDENT_AMBULATORY_CARE_PROVIDER_SITE_OTHER): Payer: Medicare Other

## 2010-12-04 DIAGNOSIS — Z23 Encounter for immunization: Secondary | ICD-10-CM

## 2010-12-05 ENCOUNTER — Ambulatory Visit (INDEPENDENT_AMBULATORY_CARE_PROVIDER_SITE_OTHER): Payer: Medicare Other | Admitting: *Deleted

## 2010-12-05 DIAGNOSIS — I359 Nonrheumatic aortic valve disorder, unspecified: Secondary | ICD-10-CM

## 2010-12-05 DIAGNOSIS — I4891 Unspecified atrial fibrillation: Secondary | ICD-10-CM

## 2010-12-05 LAB — POCT INR: INR: 2.8

## 2011-01-02 ENCOUNTER — Ambulatory Visit (INDEPENDENT_AMBULATORY_CARE_PROVIDER_SITE_OTHER): Payer: Medicare Other | Admitting: *Deleted

## 2011-01-02 DIAGNOSIS — I359 Nonrheumatic aortic valve disorder, unspecified: Secondary | ICD-10-CM

## 2011-01-02 DIAGNOSIS — I4891 Unspecified atrial fibrillation: Secondary | ICD-10-CM

## 2011-01-02 LAB — POCT INR: INR: 3.1

## 2011-01-10 ENCOUNTER — Other Ambulatory Visit: Payer: Self-pay | Admitting: Internal Medicine

## 2011-01-10 NOTE — Telephone Encounter (Signed)
Refill request on Zolpidem tartrate 5 mg take one tablet by mouth at bedtime please Advise refills

## 2011-01-13 ENCOUNTER — Other Ambulatory Visit: Payer: Self-pay | Admitting: Cardiology

## 2011-01-16 ENCOUNTER — Telehealth: Payer: Self-pay | Admitting: *Deleted

## 2011-01-16 MED ORDER — ZOLPIDEM TARTRATE 5 MG PO TABS: 5.0000 mg | ORAL_TABLET | Freq: Every evening | ORAL | Status: DC | PRN

## 2011-01-16 NOTE — Telephone Encounter (Signed)
Patient calling about Zolpidem refill [shows refused on 11.09.12]. Pt requesting call back today.

## 2011-01-16 NOTE — Telephone Encounter (Signed)
Pt requesting refill on zolpidem, please Advise

## 2011-01-16 NOTE — Telephone Encounter (Signed)
ok 

## 2011-02-03 ENCOUNTER — Encounter: Payer: Medicare Other | Admitting: *Deleted

## 2011-02-06 ENCOUNTER — Encounter (INDEPENDENT_AMBULATORY_CARE_PROVIDER_SITE_OTHER): Payer: Medicare Other | Admitting: *Deleted

## 2011-02-06 DIAGNOSIS — I359 Nonrheumatic aortic valve disorder, unspecified: Secondary | ICD-10-CM

## 2011-02-06 DIAGNOSIS — I4891 Unspecified atrial fibrillation: Secondary | ICD-10-CM

## 2011-02-11 ENCOUNTER — Other Ambulatory Visit: Payer: Self-pay | Admitting: Internal Medicine

## 2011-02-11 NOTE — Telephone Encounter (Signed)
The pt called and stated he usually is prescribed a 90 day supply of Ambien.  His rx for this past refill was for 30days.  Can he get a refill for another 30 days? [employer] [facility])   Thanks so much!

## 2011-02-12 NOTE — Telephone Encounter (Signed)
Ok for 90 day Rx [employer]

## 2011-02-13 MED ORDER — ZOLPIDEM TARTRATE 5 MG PO TABS: 5.0000 mg | ORAL_TABLET | Freq: Every evening | ORAL | Status: DC | PRN

## 2011-02-19 ENCOUNTER — Ambulatory Visit: Payer: Medicare Other | Admitting: Internal Medicine

## 2011-02-21 ENCOUNTER — Ambulatory Visit: Payer: Self-pay | Admitting: Internal Medicine

## 2011-02-27 ENCOUNTER — Encounter: Payer: Self-pay | Admitting: Internal Medicine

## 2011-02-27 ENCOUNTER — Other Ambulatory Visit (INDEPENDENT_AMBULATORY_CARE_PROVIDER_SITE_OTHER): Payer: Medicare Other

## 2011-02-27 ENCOUNTER — Ambulatory Visit (INDEPENDENT_AMBULATORY_CARE_PROVIDER_SITE_OTHER): Payer: Medicare Other | Admitting: Internal Medicine

## 2011-02-27 DIAGNOSIS — I482 Chronic atrial fibrillation, unspecified: Secondary | ICD-10-CM

## 2011-02-27 DIAGNOSIS — I4891 Unspecified atrial fibrillation: Secondary | ICD-10-CM

## 2011-02-27 DIAGNOSIS — E119 Type 2 diabetes mellitus without complications: Secondary | ICD-10-CM

## 2011-02-27 DIAGNOSIS — I10 Essential (primary) hypertension: Secondary | ICD-10-CM

## 2011-02-27 DIAGNOSIS — C679 Malignant neoplasm of bladder, unspecified: Secondary | ICD-10-CM

## 2011-02-27 LAB — COMPREHENSIVE METABOLIC PANEL WITH GFR
ALT: 24 U/L (ref 0–53)
AST: 23 U/L (ref 0–37)
Albumin: 4.1 g/dL (ref 3.5–5.2)
Alkaline Phosphatase: 56 U/L (ref 39–117)
BUN: 22 mg/dL (ref 6–23)
CO2: 29 meq/L (ref 19–32)
Calcium: 9.1 mg/dL (ref 8.4–10.5)
Chloride: 102 meq/L (ref 96–112)
Creatinine, Ser: 1.2 mg/dL (ref 0.4–1.5)
GFR: 65.26 mL/min
Glucose, Bld: 100 mg/dL — ABNORMAL HIGH (ref 70–99)
Potassium: 3.8 meq/L (ref 3.5–5.1)
Sodium: 137 meq/L (ref 135–145)
Total Bilirubin: 1.4 mg/dL — ABNORMAL HIGH (ref 0.3–1.2)
Total Protein: 7.1 g/dL (ref 6.0–8.3)

## 2011-02-27 NOTE — Progress Notes (Signed)
  Subjective:    Patient ID: Eric Rivers, male    DOB: 21-Dec-1930, 77 y.o.   MRN: [id number]  HPI Eric Rivers presents for follow-up. IN the interval since his last visit he has had a thorough w/u for hematuria with cystosocopy, mapping biopsy and retrograde pyelogram - all path reports were negative. For cystoscopy today.  In November he had right heel pain - Dr. Elvin Rivers- diagnosed plantar fasciitis: exercises and inserts. Also had steroid injections. He is doing better.   He reports that his blood sugar has been ok. He is other then the above feeling well.  I have reviewed the patient's medical history in detail and updated the computerized patient record.   Review of Systems System review is negative for any constitutional, cardiac, pulmonary, GI or neuro symptoms or complaints other than as described in the HPI.     Objective:   Physical Exam Vitals reviewed - stable Gen'l- well nourished white male in no distress HEENT - C&S clear, PERRLA Pulm - normal respirations, lungs clear Cor - IRIR rate controlled. Neuro - A&O x 3, normal strength and normal gait.   Lab Results  Component Value Date   WBC 7.4 08/21/2010   HGB 14.6 10/16/2010   HCT 38.2* 08/21/2010   PLT 190.0 08/21/2010   GLUCOSE 100* 02/27/2011   CHOL 104 08/21/2010   TRIG 76.0 08/21/2010   HDL 37.60* 08/21/2010   LDLCALC 51 08/21/2010   ALT 24 02/27/2011   AST 23 02/27/2011   NA 137 02/27/2011   K 3.8 02/27/2011   CL 102 02/27/2011   CREATININE 1.2 02/27/2011   BUN 22 02/27/2011   CO2 29 02/27/2011   TSH 1.47 08/21/2010   PSA 2.19 12/06/2007   INR 3.3 02/06/2011   HGBA1C 6.2 02/27/2011   MICROALBUR 2.3* 06/03/2006          Assessment & Plan:  Leaving for [place] Saturday week. Plan [place]

## 2011-02-28 ENCOUNTER — Ambulatory Visit (INDEPENDENT_AMBULATORY_CARE_PROVIDER_SITE_OTHER): Payer: Medicare Other | Admitting: *Deleted

## 2011-02-28 ENCOUNTER — Telehealth: Payer: Self-pay | Admitting: *Deleted

## 2011-02-28 DIAGNOSIS — I359 Nonrheumatic aortic valve disorder, unspecified: Secondary | ICD-10-CM

## 2011-02-28 DIAGNOSIS — I4891 Unspecified atrial fibrillation: Secondary | ICD-10-CM

## 2011-02-28 LAB — POCT INR: INR: 2.8

## 2011-02-28 NOTE — Telephone Encounter (Signed)
Message copied by Rosalio Macadamia, Dafney Farler B on Fri Feb 28, 2011  4:24 PM ------      Message from: Jacques Navy      Created: Fri Feb 28, 2011 11:10 AM       Please call: A1C 6.2 % - Good work. Enjoy the [place] but stay away from the [place]

## 2011-02-28 NOTE — Assessment & Plan Note (Signed)
BP Readings from Last 3 Encounters:  02/27/11 106/74  11/07/10 128/80  08/21/10 132/80   Great control. Today's BP a little low.   Plan - continue present medications

## 2011-02-28 NOTE — Telephone Encounter (Signed)
Informed pt .

## 2011-02-28 NOTE — Assessment & Plan Note (Signed)
Doing well. A1C is good at 6.2%

## 2011-02-28 NOTE — Assessment & Plan Note (Signed)
Heart rate is stable today. He is asymptomatic. He follows closely with Dr. Myrtis Ser  Plan - continue present regimen

## 2011-02-28 NOTE — Assessment & Plan Note (Signed)
Doing OK. Last eval in August was good - no evidence of malignancy. He is scheduled for follow up with Dr. Margarita Grizzle today.

## 2011-03-02 ENCOUNTER — Encounter: Payer: Self-pay | Admitting: Internal Medicine

## 2011-03-03 ENCOUNTER — Telehealth: Payer: Self-pay

## 2011-03-03 NOTE — Telephone Encounter (Signed)
Message copied by Migdalia Dk on Mon Mar 03, 2011  2:59 PM ------      Message from: Ballinger, [place] R      Created: Fri Feb 28, 2011  5:16 PM      Regarding: FW: Coumadin Clearance      Contact: [phone]                   ----- Message -----         From: Luis Abed, MD         Sent: 02/28/2011  12:26 PM           To: Amy Waldron Session, RN      Subject: RE: Coumadin Clearance                                   It is okay for the patient to hold Coumadin 8 days prior to the dental procedure. Coumadin should be restarted after the dental procedure      ----- Message -----         From: Carollee Sires, RN         Sent: 02/28/2011  11:19 AM           To: Luis Abed, MD, Jefferey Pica, RN      Subject: Coumadin Clearance                                       Eric Rivers is scheduled for dental extraction x1 and implants x 2 with Peridontist Dr Filbert Berthold on [address] on 2/25. He states he was advised by the peridontist to stop his coumadin 8 days before his procedure. Please advise.

## 2011-03-03 NOTE — Telephone Encounter (Signed)
Pt aware ok to hold Coumadin.

## 2011-03-28 ENCOUNTER — Encounter: Payer: 59 | Admitting: *Deleted

## 2011-04-01 ENCOUNTER — Ambulatory Visit (INDEPENDENT_AMBULATORY_CARE_PROVIDER_SITE_OTHER): Payer: Medicare Other | Admitting: Pharmacist

## 2011-04-01 DIAGNOSIS — I4891 Unspecified atrial fibrillation: Secondary | ICD-10-CM

## 2011-04-01 DIAGNOSIS — I359 Nonrheumatic aortic valve disorder, unspecified: Secondary | ICD-10-CM | POA: Diagnosis not present

## 2011-04-01 LAB — POCT INR: INR: 1.9

## 2011-05-06 ENCOUNTER — Ambulatory Visit (INDEPENDENT_AMBULATORY_CARE_PROVIDER_SITE_OTHER): Payer: Medicare Other | Admitting: *Deleted

## 2011-05-06 DIAGNOSIS — I4891 Unspecified atrial fibrillation: Secondary | ICD-10-CM | POA: Diagnosis not present

## 2011-05-06 DIAGNOSIS — I359 Nonrheumatic aortic valve disorder, unspecified: Secondary | ICD-10-CM

## 2011-05-06 LAB — POCT INR: INR: 1.4

## 2011-05-07 ENCOUNTER — Other Ambulatory Visit: Payer: Self-pay | Admitting: Internal Medicine

## 2011-05-08 DIAGNOSIS — E1159 Type 2 diabetes mellitus with other circulatory complications: Secondary | ICD-10-CM | POA: Diagnosis not present

## 2011-05-08 DIAGNOSIS — L608 Other nail disorders: Secondary | ICD-10-CM | POA: Diagnosis not present

## 2011-05-08 DIAGNOSIS — I739 Peripheral vascular disease, unspecified: Secondary | ICD-10-CM | POA: Diagnosis not present

## 2011-05-19 ENCOUNTER — Ambulatory Visit (INDEPENDENT_AMBULATORY_CARE_PROVIDER_SITE_OTHER): Payer: Medicare Other | Admitting: Pharmacist

## 2011-05-19 ENCOUNTER — Ambulatory Visit: Payer: 59 | Admitting: Cardiology

## 2011-05-19 DIAGNOSIS — I359 Nonrheumatic aortic valve disorder, unspecified: Secondary | ICD-10-CM | POA: Diagnosis not present

## 2011-05-19 DIAGNOSIS — I4891 Unspecified atrial fibrillation: Secondary | ICD-10-CM

## 2011-05-19 LAB — POCT INR: INR: 2.7

## 2011-05-31 ENCOUNTER — Other Ambulatory Visit: Payer: Self-pay | Admitting: Internal Medicine

## 2011-06-02 ENCOUNTER — Ambulatory Visit (INDEPENDENT_AMBULATORY_CARE_PROVIDER_SITE_OTHER): Payer: Medicare Other | Admitting: Pharmacist

## 2011-06-02 DIAGNOSIS — I359 Nonrheumatic aortic valve disorder, unspecified: Secondary | ICD-10-CM | POA: Diagnosis not present

## 2011-06-02 DIAGNOSIS — I4891 Unspecified atrial fibrillation: Secondary | ICD-10-CM

## 2011-06-02 LAB — POCT INR: INR: 2.9

## 2011-06-10 DIAGNOSIS — D485 Neoplasm of uncertain behavior of skin: Secondary | ICD-10-CM | POA: Diagnosis not present

## 2011-06-10 DIAGNOSIS — Z85820 Personal history of malignant melanoma of skin: Secondary | ICD-10-CM | POA: Diagnosis not present

## 2011-06-10 DIAGNOSIS — C4442 Squamous cell carcinoma of skin of scalp and neck: Secondary | ICD-10-CM | POA: Diagnosis not present

## 2011-06-21 ENCOUNTER — Other Ambulatory Visit: Payer: Self-pay | Admitting: Internal Medicine

## 2011-06-24 DIAGNOSIS — Z8551 Personal history of malignant neoplasm of bladder: Secondary | ICD-10-CM | POA: Diagnosis not present

## 2011-07-01 ENCOUNTER — Ambulatory Visit (INDEPENDENT_AMBULATORY_CARE_PROVIDER_SITE_OTHER): Payer: Medicare Other

## 2011-07-01 ENCOUNTER — Ambulatory Visit (INDEPENDENT_AMBULATORY_CARE_PROVIDER_SITE_OTHER): Payer: Medicare Other | Admitting: Cardiology

## 2011-07-01 ENCOUNTER — Encounter: Payer: Self-pay | Admitting: Cardiology

## 2011-07-01 VITALS — BP 120/74 | HR 58 | Ht 68.0 in | Wt 243.0 lb

## 2011-07-01 DIAGNOSIS — I359 Nonrheumatic aortic valve disorder, unspecified: Secondary | ICD-10-CM

## 2011-07-01 DIAGNOSIS — I4891 Unspecified atrial fibrillation: Secondary | ICD-10-CM | POA: Diagnosis not present

## 2011-07-01 DIAGNOSIS — I482 Chronic atrial fibrillation, unspecified: Secondary | ICD-10-CM

## 2011-07-01 DIAGNOSIS — Z7901 Long term (current) use of anticoagulants: Secondary | ICD-10-CM | POA: Diagnosis not present

## 2011-07-01 DIAGNOSIS — I350 Nonrheumatic aortic (valve) stenosis: Secondary | ICD-10-CM

## 2011-07-01 NOTE — Assessment & Plan Note (Signed)
The patient's atrial fib is nicely controlled. He has no symptoms. His resting rate is 58. I've considered the possibility of lowering his verapamil dose. However he feels very well and I suspect he has very good rate control with exercise.

## 2011-07-01 NOTE — Patient Instructions (Signed)
Your physician wants you to follow-up in: approx. 6 months.  You will receive a reminder letter in the mail two months in advance. If you don't receive a letter, please call our office to schedule the follow-up appointment.

## 2011-07-01 NOTE — Assessment & Plan Note (Signed)
He continues on Coumadin for his atrial fibrillation. No change in therapy. 

## 2011-07-01 NOTE — Progress Notes (Signed)
HPI Patient is seen today to followup atrial fibrillation. He is stable. He's not having any chest pain or shortness of breath. There is no syncope or presyncope. He does not feel any palpitations.  No Known Allergies  Current Outpatient Prescriptions  Medication Sig Dispense Refill  . allopurinol (ZYLOPRIM) 300 MG tablet Take 1 tablet (300 mg total) by mouth daily.  90 tablet  3  . atorvastatin (LIPITOR) 20 MG tablet Take 20 mg by mouth daily.        . CELEBREX 200 MG capsule take 1 capsule by mouth once daily  90 capsule  3  . finasteride (PROSCAR) 5 MG tablet Take 5 mg by mouth daily.        . furosemide (LASIX) 40 MG tablet take 1 tablet by mouth once daily  90 tablet  3  . ketoconazole (NIZORAL) 2 % shampoo Apply 1 application topically Daily.      . Multiple Vitamins-Minerals (ONCOVITE PO) Take by mouth.        . pantoprazole (PROTONIX) 40 MG tablet Take 1 tablet by mouth daily.      . potassium chloride SA (K-DUR,KLOR-CON) 20 MEQ tablet Take 1 tablet (20 mEq total) by mouth daily.  90 tablet  3  . senna (SENOKOT) 8.6 MG tablet Take 1 tablet by mouth daily.        . sitaGLIPtin (JANUVIA) 100 MG tablet Take 1 tablet (100 mg total) by mouth daily.  30 tablet  0  . Tamsulosin HCl (FLOMAX) 0.4 MG CAPS Take 0.4 mg by mouth at bedtime.      . verapamil (COVERA HS) 240 MG (CO) 24 hr tablet Take 1 tablet (240 mg total) by mouth at bedtime.  90 tablet  3  . warfarin (COUMADIN) 4 MG tablet Take 1 tablet (4 mg total) by mouth as directed.  120 tablet  1  . zolpidem (AMBIEN) 5 MG tablet take 1 tablet by mouth at bedtime if needed for sleep  90 tablet  0  . DISCONTD: atorvastatin (LIPITOR) 20 MG tablet take 1 tablet by mouth once daily  90 tablet  1  . DISCONTD: JANUVIA 100 MG tablet take 1 tablet by mouth once daily  90 tablet  1    History   Social History  . Marital Status: Single    Spouse Name: N/A    Number of Children: 0  . Years of Education: 63   Occupational History  .  teacher     retired   Social History Main Topics  . Smoking status: Former Smoker    Types: Cigarettes    Quit date: 03/04/1975  . Smokeless tobacco: Never Used  . Alcohol Use: 2.5 oz/week    5 drink(s) per week  . Drug Use: No  . Sexually Active: Not Currently   Other Topics Concern  . Not on file   Social History Narrative   Confirmed batchelor. Retired Engineer, site. World traveler- Geologist, engineering. I- ADLS. End of life CareNo CPR, no prolonged intubation, no prolonged artificial hydration or feeding, no heroic or futile measures.     No family history on file.  Past Medical History  Diagnosis Date  . Whooping cough   . Bladder cancer     recurrence with TUR-B March '09  . Atrial fibrillation      Chronic,   24 hour holter, September, 2011.... atrial fib rate is controlled.... there is some bradycardia but  no marked pauses  . Diabetes mellitus  type 2  . Hypertension   . DDD (degenerative disc disease)     lumbar spine  . GERD (gastroesophageal reflux disease)   . Gout   . Fluid overload   . ACE-inhibitor cough   . Shortness of breath   . Aortic stenosis     mild....echo... september... 2010/ mild... echo.[name] KitchenMarland KitchenDec, 2011  . Pulmonary hypertension     , echo, 01/2010  . Hyperlipidemia   . Hx of colonoscopy   . Warfarin anticoagulation     Atrial fib  . Ejection fraction     EF 60%, echo, 01/2010  . Normal nuclear stress test     06/2005 , also ABI normal 2007    Past Surgical History  Procedure Date  . Lumbar laminectomy     '02  . Tur-bt '06, '09     ROS  Patient denies fever, chills, headache, sweats, rash, change in vision, change in hearing, chest pain, cough, nausea vomiting, urinary symptoms. All other systems are reviewed and are negative.  PHYSICAL EXAM   Patient looks quite good. He is overweight. There is no jugulovenous distention. Lungs are clear. Respiratory effort is nonlabored. Cardiac exam reveals S1 and S2.  There is a soft  systolic murmur. The abdomen is soft. There is no peripheral edema.  Filed Vitals:   07/01/11 1525  BP: 120/74  Pulse: 58  Height: 5\' 8"  (1.727 m)  Weight: 243 lb (110.224 kg)   EKG is done today and reviewed by me. He has atrial fib with a relatively slow rate.  ASSESSMENT & PLAN

## 2011-07-01 NOTE — Assessment & Plan Note (Signed)
There is mild aortic stenosis. He does not need an echo at this time.

## 2011-07-18 DIAGNOSIS — C4442 Squamous cell carcinoma of skin of scalp and neck: Secondary | ICD-10-CM | POA: Diagnosis not present

## 2011-07-18 DIAGNOSIS — C4492 Squamous cell carcinoma of skin, unspecified: Secondary | ICD-10-CM | POA: Diagnosis not present

## 2011-07-21 DIAGNOSIS — L84 Corns and callosities: Secondary | ICD-10-CM | POA: Diagnosis not present

## 2011-07-21 DIAGNOSIS — I739 Peripheral vascular disease, unspecified: Secondary | ICD-10-CM | POA: Diagnosis not present

## 2011-07-21 DIAGNOSIS — E1159 Type 2 diabetes mellitus with other circulatory complications: Secondary | ICD-10-CM | POA: Diagnosis not present

## 2011-07-21 DIAGNOSIS — L608 Other nail disorders: Secondary | ICD-10-CM | POA: Diagnosis not present

## 2011-07-29 ENCOUNTER — Other Ambulatory Visit: Payer: Self-pay | Admitting: Internal Medicine

## 2011-07-30 NOTE — Telephone Encounter (Signed)
Rx ambien called to vm at [facility]

## 2011-08-07 DIAGNOSIS — Z85828 Personal history of other malignant neoplasm of skin: Secondary | ICD-10-CM | POA: Diagnosis not present

## 2011-08-07 DIAGNOSIS — Z85820 Personal history of malignant melanoma of skin: Secondary | ICD-10-CM | POA: Diagnosis not present

## 2011-08-07 DIAGNOSIS — L570 Actinic keratosis: Secondary | ICD-10-CM | POA: Diagnosis not present

## 2011-08-07 DIAGNOSIS — D239 Other benign neoplasm of skin, unspecified: Secondary | ICD-10-CM | POA: Diagnosis not present

## 2011-08-07 DIAGNOSIS — L819 Disorder of pigmentation, unspecified: Secondary | ICD-10-CM | POA: Diagnosis not present

## 2011-08-12 ENCOUNTER — Ambulatory Visit (INDEPENDENT_AMBULATORY_CARE_PROVIDER_SITE_OTHER): Payer: Medicare Other | Admitting: *Deleted

## 2011-08-12 DIAGNOSIS — I359 Nonrheumatic aortic valve disorder, unspecified: Secondary | ICD-10-CM | POA: Diagnosis not present

## 2011-08-12 DIAGNOSIS — I4891 Unspecified atrial fibrillation: Secondary | ICD-10-CM | POA: Diagnosis not present

## 2011-08-22 DIAGNOSIS — N400 Enlarged prostate without lower urinary tract symptoms: Secondary | ICD-10-CM | POA: Diagnosis not present

## 2011-08-26 ENCOUNTER — Other Ambulatory Visit: Payer: Self-pay | Admitting: Cardiology

## 2011-08-28 ENCOUNTER — Encounter: Payer: Self-pay | Admitting: Internal Medicine

## 2011-08-28 ENCOUNTER — Ambulatory Visit (INDEPENDENT_AMBULATORY_CARE_PROVIDER_SITE_OTHER): Payer: Medicare Other | Admitting: Internal Medicine

## 2011-08-28 ENCOUNTER — Other Ambulatory Visit (INDEPENDENT_AMBULATORY_CARE_PROVIDER_SITE_OTHER): Payer: Medicare Other

## 2011-08-28 VITALS — BP 110/70 | HR 80 | Temp 98.0°F | Resp 16 | Ht 68.0 in | Wt 240.0 lb

## 2011-08-28 DIAGNOSIS — I4891 Unspecified atrial fibrillation: Secondary | ICD-10-CM

## 2011-08-28 DIAGNOSIS — K219 Gastro-esophageal reflux disease without esophagitis: Secondary | ICD-10-CM | POA: Diagnosis not present

## 2011-08-28 DIAGNOSIS — E785 Hyperlipidemia, unspecified: Secondary | ICD-10-CM | POA: Diagnosis not present

## 2011-08-28 DIAGNOSIS — E119 Type 2 diabetes mellitus without complications: Secondary | ICD-10-CM

## 2011-08-28 DIAGNOSIS — Z0000 Encounter for general adult medical examination without abnormal findings: Secondary | ICD-10-CM

## 2011-08-28 DIAGNOSIS — I482 Chronic atrial fibrillation, unspecified: Secondary | ICD-10-CM

## 2011-08-28 DIAGNOSIS — I10 Essential (primary) hypertension: Secondary | ICD-10-CM

## 2011-08-28 DIAGNOSIS — M109 Gout, unspecified: Secondary | ICD-10-CM | POA: Diagnosis not present

## 2011-08-28 DIAGNOSIS — C679 Malignant neoplasm of bladder, unspecified: Secondary | ICD-10-CM

## 2011-08-28 LAB — CBC WITH DIFFERENTIAL/PLATELET
Basophils Absolute: 0.1 10*3/uL (ref 0.0–0.1)
Basophils Relative: 1 % (ref 0.0–3.0)
Eosinophils Absolute: 0.4 10*3/uL (ref 0.0–0.7)
Eosinophils Relative: 4.2 % (ref 0.0–5.0)
HCT: 41 % (ref 39.0–52.0)
Hemoglobin: 13.6 g/dL (ref 13.0–17.0)
Lymphocytes Relative: 17.5 % (ref 12.0–46.0)
Lymphs Abs: 1.6 10*3/uL (ref 0.7–4.0)
MCHC: 33 g/dL (ref 30.0–36.0)
MCV: 97.7 fl (ref 78.0–100.0)
Monocytes Absolute: 0.6 10*3/uL (ref 0.1–1.0)
Monocytes Relative: 7.1 % (ref 3.0–12.0)
Neutro Abs: 6.4 10*3/uL (ref 1.4–7.7)
Neutrophils Relative %: 70.2 % (ref 43.0–77.0)
Platelets: 180 10*3/uL (ref 150.0–400.0)
RBC: 4.2 Mil/uL — ABNORMAL LOW (ref 4.22–5.81)
RDW: 15.3 % — ABNORMAL HIGH (ref 11.5–14.6)
WBC: 9.1 10*3/uL (ref 4.5–10.5)

## 2011-08-28 LAB — COMPREHENSIVE METABOLIC PANEL
ALT: 19 U/L (ref 0–53)
BUN: 15 mg/dL (ref 6–23)
CO2: 29 mEq/L (ref 19–32)
Creatinine, Ser: 0.9 mg/dL (ref 0.4–1.5)
GFR: 87.61 mL/min (ref >60.00)
Total Bilirubin: 1 mg/dL (ref 0.3–1.2)

## 2011-08-28 LAB — LIPID PANEL
Cholesterol: 110 mg/dL (ref 0–200)
HDL: 40.7 mg/dL (ref >39.00)
LDL Cholesterol: 48 mg/dL (ref 0–99)
Total CHOL/HDL Ratio: 3
Triglycerides: 108 mg/dL (ref 0.0–149.0)
VLDL: 21.6 mg/dL (ref 0.0–40.0)

## 2011-08-28 LAB — HEPATIC FUNCTION PANEL
AST: 26 U/L (ref 0–37)
Albumin: 4 g/dL (ref 3.5–5.2)
Alkaline Phosphatase: 56 U/L (ref 39–117)
Bilirubin, Direct: 0.2 mg/dL (ref 0.0–0.3)

## 2011-08-28 NOTE — Progress Notes (Signed)
Subjective:    Patient ID: Eric Rivers, male    DOB: 1930/12/08, 78 y.o.   MRN: [id number]  HPI The patient is here for annual Medicare wellness examination and management of other chronic and acute problems. He is feeling well and doing well. He does have a lingering cough from a cold in Mid-May. He did have skin surgery for removal of squamous cell carcinoma left frontal scalp.   The risk factors are reflected in the social history.  The roster of all physicians providing medical care to patient - is listed in the Snapshot section of the chart.  Activities of daily living:  The patient is 100% inedpendent in all ADLs: dressing, toileting, feeding as well as independent mobility  Home safety : The patient has smoke detectors in the home. Fall - home is fall safe. They wear seatbelts. No firearms at home. There is no violence in the home.   There is no risks for hepatitis, STDs or HIV. There is no  history of blood transfusion. They have a travel history to infectious disease endemic areas of the world but he is ship bound and careful.  The patient has seen their dentist in the last six month. They have  seen their eye doctor in the last year. They deny any hearing difficulty and have not had audiologic testing in the last year.  They do not  have excessive sun exposure. Discussed the need for sun protection: hats, long sleeves and use of sunscreen if there is significant sun exposure.   Diet: the importance of a healthy diet is discussed. They do have a healthy diet.  The patient has a regular exercise program.  The benefits of regular aerobic exercise were discussed.  Depression screen: there are no signs or vegative symptoms of depression- irritability, change in appetite, anhedonia, sadness/tearfullness.  Cognitive assessment: the patient manages all their financial and personal affairs and is actively engaged.   The following portions of the patient's history were reviewed and  updated as appropriate: allergies, current medications, past family history, past medical history,  past surgical history, past social history  and problem list.  Past Medical History  Diagnosis Date  . Whooping cough   . Bladder cancer     recurrence with TUR-B March '09  . Atrial fibrillation      Chronic,   24 hour holter, September, 2011.... atrial fib rate is controlled.... there is some bradycardia but  no marked pauses  . Diabetes mellitus     type 2  . Hypertension   . DDD (degenerative disc disease)     lumbar spine  . GERD (gastroesophageal reflux disease)   . Gout   . Fluid overload   . ACE-inhibitor cough   . Shortness of breath   . Aortic stenosis     mild....echo... september... 2010/ mild... echo.[name] KitchenMarland KitchenDec, 2011  . Pulmonary hypertension     , echo, 01/2010  . Hyperlipidemia   . Hx of colonoscopy   . Warfarin anticoagulation     Atrial fib  . Ejection fraction     EF 60%, echo, 01/2010  . Normal nuclear stress test     06/2005 , also ABI normal 2007   Past Surgical History  Procedure Date  . Lumbar laminectomy     '02  . Tur-bt '06, '09    No family history on file. History   Social History  . Marital Status: Single    Spouse Name: N/A    Number of  Children: 0  . Years of Education: 14   Occupational History  . teacher     retired   Social History Main Topics  . Smoking status: Former Smoker    Types: Cigarettes    Quit date: 03/04/1975  . Smokeless tobacco: Never Used  . Alcohol Use: 2.5 oz/week    5 drink(s) per week  . Drug Use: No  . Sexually Active: Not Currently   Other Topics Concern  . Not on file   Social History Narrative   Confirmed batchelor. Retired Engineer, site. World traveler- Geologist, engineering. I- ADLS. End of life CareNo CPR, no prolonged intubation, no prolonged artificial hydration or feeding, no heroic or futile measures.       Vision, hearing, body mass index were assessed and reviewed.   During the course of  the visit the patient was educated and counseled about appropriate screening and preventive services including : fall prevention , diabetes screening, nutrition counseling, colorectal cancer screening, and recommended immunizations.    Review of Systems Constitutional:  Negative for fever, chills, activity change and unexpected weight change.  HEENT:  Negative for hearing loss, ear pain, congestion, neck stiffness and postnasal drip. Negative for sore throat or swallowing problems. Negative for dental complaints.   Eyes: Negative for vision loss or change in visual acuity.  Respiratory: Negative for chest tightness and wheezing. Negative for DOE.   Cardiovascular: Negative for chest pain or palpitations. No decreased exercise tolerance Gastrointestinal: No change in bowel habit. No bloating or gas. No reflux or indigestion Genitourinary: Negative for urgency, frequency, flank pain and difficulty urinating. Occasional hematuria and an episode of hemorrhage Musculoskeletal: Negative for myalgias, back pain, arthralgias and gait problem.  Neurological: Negative for dizziness, tremors, weakness and headaches.  Hematological: Negative for adenopathy.  Psychiatric/Behavioral: Negative for behavioral problems and dysphoric mood.       Objective:   Physical Exam Filed Vitals:   08/28/11 0906  BP: 110/70  Pulse: 80  Temp: 98 F (36.7 C)  Resp: 16   Wt Readings from Last 3 Encounters:  08/28/11 240 lb (108.863 kg)  07/01/11 243 lb (110.224 kg)  02/27/11 242 lb (109.77 kg)    Gen'l: Well nourished well developed, older white male in no acute distress  HEENT: Head: Normocephalic and atraumatic. Right Ear: External ear normal. EAC/TM nl. Left Ear: External ear normal.  EAC/TM nl. Nose: Nose normal. Mouth/Throat: Oropharynx is clear and moist. Dentition - native, in good repair. No buccal or palatal lesions. Posterior pharynx clear. Eyes: Conjunctivae and sclera clear. EOM intact. Pupils are  equal, round, and reactive to light. Right eye exhibits no discharge. Left eye exhibits no discharge. Neck: Normal range of motion. Neck supple. No JVD present. No tracheal deviation present. No thyromegaly present.  Cardiovascular: Normal rate, regular rhythm, no gallop, no friction rub, no murmur heard.      Quiet precordium. 2+ radial and DP pulses . No carotid bruits Pulmonary/Chest: Effort normal. No respiratory distress or increased WOB, no wheezes, no rales. No chest wall deformity but increased AP diameter or CVAT. Abdominal: Soft. Obese, Bowel sounds are normal in all quadrants. He exhibits no distension, no tenderness, no rebound or guarding, No heptosplenomegaly  Genitourinary:  deferred to Dr. Margarita Grizzle Musculoskeletal: Normal range of motion. He exhibits no edema and no tenderness.       Small and large joints without redness, synovial thickening or deformity. Full range of motion preserved about all small, median and large joints.  Lymphadenopathy:  He has no cervical or supraclavicular adenopathy.  Neurological: He is alert and oriented to person, place, and time. CN II-XII intact. DTRs 2+ and symmetrical biceps, radial and patellar tendons. Cerebellar function normal with no tremor, rigidity, normal gait and station. normal sensation to light touch and pin-prick, decreased deep vibratory sensation. Skin: Skin is warm and dry. No rash noted. No erythema. Well healed surgical scar left frontal scalp - 4 inches in length. Psychiatric: He has a normal mood and affect. His behavior is normal. Thought content normal.   Lab Results  Component Value Date   WBC 9.1 08/28/2011   HGB 13.6 08/28/2011   HCT 41.0 08/28/2011   PLT 180.0 08/28/2011   GLUCOSE 133* 08/28/2011   CHOL 110 08/28/2011   TRIG 108.0 08/28/2011   HDL 40.70 08/28/2011   LDLCALC 48 08/28/2011         ALT 19 08/28/2011   AST 26 08/28/2011         NA 139 08/28/2011   K 3.9 08/28/2011   CL 105 08/28/2011   CREATININE 0.9  08/28/2011   BUN 15 08/28/2011   CO2 29 08/28/2011   TSH 1.47 08/21/2010   PSA 2.19 12/06/2007   INR 3.0 08/12/2011   HGBA1C 6.4 08/28/2011   MICROALBUR 2.3* 06/03/2006         Assessment & Plan:

## 2011-08-29 ENCOUNTER — Telehealth: Payer: Self-pay | Admitting: Cardiology

## 2011-08-29 DIAGNOSIS — R972 Elevated prostate specific antigen [PSA]: Secondary | ICD-10-CM | POA: Diagnosis not present

## 2011-08-29 NOTE — Assessment & Plan Note (Signed)
BP Readings from Last 3 Encounters:  08/28/11 110/70  07/01/11 120/74  02/27/11 106/74   Very good control.  Plan - continue present regimen

## 2011-08-29 NOTE — Assessment & Plan Note (Signed)
Very stable with excellent rate control.   Plan - per Dr. Myrtis Ser

## 2011-08-29 NOTE — Assessment & Plan Note (Signed)
No recent flares. Last Uric acid June '12 = 3.8

## 2011-08-29 NOTE — Assessment & Plan Note (Signed)
Interval history without new problems. Physical exam notable for weight issues. Lab results are in normal limits. He is current with colorectal cancer screening. Followed by Urology. Immunizations are up to date.  In summary - a very nice man who does appear medically stable. He is encouraged to consider a flex/stretch exercise program. He will return in Sept prior to his [place].

## 2011-08-29 NOTE — Assessment & Plan Note (Signed)
A1C is better than goal.  Plan - continue present regimen

## 2011-08-29 NOTE — Assessment & Plan Note (Signed)
Last eval by Dr. Margarita Grizzle was unremarkable including negative cystoscopy.  Plan - follow up as directed by Dr. Margarita Grizzle

## 2011-08-29 NOTE — Assessment & Plan Note (Signed)
Lab reveals LDL that is much better than goal of 80 or less  Plan - continue present regimen

## 2011-08-29 NOTE — Telephone Encounter (Signed)
Please return call to [facility]- RN Dr. Margarita Grizzle office [phone] ext [phone]  Patient having ultra sound and biospy 09/25/11, patient would like to hold warfarin. Please send documentation for clearance to Dr. Loyal Jacobson fax# [phone].

## 2011-08-31 NOTE — Telephone Encounter (Signed)
Okay to hold Coumadin for procedure. Then restart

## 2011-09-01 NOTE — Telephone Encounter (Signed)
Note to hold coumadin was faxed.

## 2011-09-15 ENCOUNTER — Other Ambulatory Visit: Payer: Self-pay | Admitting: Internal Medicine

## 2011-09-25 DIAGNOSIS — R972 Elevated prostate specific antigen [PSA]: Secondary | ICD-10-CM | POA: Diagnosis not present

## 2011-09-25 HISTORY — PX: PROSTATE BIOPSY: SHX241

## 2011-09-29 ENCOUNTER — Other Ambulatory Visit: Payer: Self-pay | Admitting: Internal Medicine

## 2011-09-29 DIAGNOSIS — E1159 Type 2 diabetes mellitus with other circulatory complications: Secondary | ICD-10-CM | POA: Diagnosis not present

## 2011-09-29 DIAGNOSIS — L84 Corns and callosities: Secondary | ICD-10-CM | POA: Diagnosis not present

## 2011-09-29 DIAGNOSIS — I739 Peripheral vascular disease, unspecified: Secondary | ICD-10-CM | POA: Diagnosis not present

## 2011-09-29 DIAGNOSIS — L608 Other nail disorders: Secondary | ICD-10-CM | POA: Diagnosis not present

## 2011-09-30 DIAGNOSIS — R972 Elevated prostate specific antigen [PSA]: Secondary | ICD-10-CM | POA: Diagnosis not present

## 2011-09-30 DIAGNOSIS — Z8551 Personal history of malignant neoplasm of bladder: Secondary | ICD-10-CM | POA: Diagnosis not present

## 2011-09-30 DIAGNOSIS — C61 Malignant neoplasm of prostate: Secondary | ICD-10-CM | POA: Diagnosis not present

## 2011-10-02 ENCOUNTER — Ambulatory Visit (HOSPITAL_COMMUNITY)
Admission: RE | Admit: 2011-10-02 | Discharge: 2011-10-02 | Disposition: A | Discharge status: Home / Self Care | Payer: Medicare Other | Source: Ambulatory Visit | Attending: Urology | Admitting: Urology

## 2011-10-02 ENCOUNTER — Other Ambulatory Visit (HOSPITAL_COMMUNITY): Payer: Self-pay | Admitting: Urology

## 2011-10-02 DIAGNOSIS — I10 Essential (primary) hypertension: Secondary | ICD-10-CM | POA: Diagnosis not present

## 2011-10-02 DIAGNOSIS — C61 Malignant neoplasm of prostate: Secondary | ICD-10-CM

## 2011-10-02 DIAGNOSIS — N400 Enlarged prostate without lower urinary tract symptoms: Secondary | ICD-10-CM | POA: Diagnosis not present

## 2011-10-02 DIAGNOSIS — I4891 Unspecified atrial fibrillation: Secondary | ICD-10-CM | POA: Diagnosis not present

## 2011-10-02 DIAGNOSIS — J984 Other disorders of lung: Secondary | ICD-10-CM | POA: Diagnosis not present

## 2011-10-02 DIAGNOSIS — Z87891 Personal history of nicotine dependence: Secondary | ICD-10-CM | POA: Insufficient documentation

## 2011-10-08 ENCOUNTER — Ambulatory Visit (INDEPENDENT_AMBULATORY_CARE_PROVIDER_SITE_OTHER): Payer: Medicare Other | Admitting: *Deleted

## 2011-10-08 DIAGNOSIS — I359 Nonrheumatic aortic valve disorder, unspecified: Secondary | ICD-10-CM | POA: Diagnosis not present

## 2011-10-08 DIAGNOSIS — I4891 Unspecified atrial fibrillation: Secondary | ICD-10-CM | POA: Diagnosis not present

## 2011-10-09 DIAGNOSIS — H2510 Age-related nuclear cataract, unspecified eye: Secondary | ICD-10-CM | POA: Diagnosis not present

## 2011-10-09 DIAGNOSIS — E119 Type 2 diabetes mellitus without complications: Secondary | ICD-10-CM | POA: Diagnosis not present

## 2011-10-21 ENCOUNTER — Ambulatory Visit (INDEPENDENT_AMBULATORY_CARE_PROVIDER_SITE_OTHER): Payer: Medicare Other

## 2011-10-21 DIAGNOSIS — I359 Nonrheumatic aortic valve disorder, unspecified: Secondary | ICD-10-CM | POA: Diagnosis not present

## 2011-10-21 DIAGNOSIS — I4891 Unspecified atrial fibrillation: Secondary | ICD-10-CM | POA: Diagnosis not present

## 2011-10-21 LAB — POCT INR: INR: 3.2

## 2011-10-22 ENCOUNTER — Encounter: Payer: Self-pay | Admitting: Internal Medicine

## 2011-10-23 DIAGNOSIS — C61 Malignant neoplasm of prostate: Secondary | ICD-10-CM | POA: Diagnosis not present

## 2011-10-27 ENCOUNTER — Encounter: Payer: Self-pay | Admitting: Radiation Oncology

## 2011-10-27 ENCOUNTER — Other Ambulatory Visit: Payer: Self-pay | Admitting: Internal Medicine

## 2011-10-27 ENCOUNTER — Ambulatory Visit
Admission: RE | Admit: 2011-10-27 | Discharge: 2011-10-27 | Disposition: A | Discharge status: Home / Self Care | Payer: Medicare Other | Source: Ambulatory Visit | Attending: Radiation Oncology | Admitting: Radiation Oncology

## 2011-10-27 VITALS — BP 121/71 | HR 61 | Temp 97.8°F | Wt 238.9 lb

## 2011-10-27 DIAGNOSIS — C61 Malignant neoplasm of prostate: Secondary | ICD-10-CM

## 2011-10-27 DIAGNOSIS — Z7901 Long term (current) use of anticoagulants: Secondary | ICD-10-CM | POA: Insufficient documentation

## 2011-10-27 DIAGNOSIS — E7800 Pure hypercholesterolemia, unspecified: Secondary | ICD-10-CM | POA: Diagnosis not present

## 2011-10-27 DIAGNOSIS — E119 Type 2 diabetes mellitus without complications: Secondary | ICD-10-CM | POA: Diagnosis not present

## 2011-10-27 DIAGNOSIS — Z87891 Personal history of nicotine dependence: Secondary | ICD-10-CM | POA: Diagnosis not present

## 2011-10-27 DIAGNOSIS — E785 Hyperlipidemia, unspecified: Secondary | ICD-10-CM | POA: Diagnosis not present

## 2011-10-27 DIAGNOSIS — Z79899 Other long term (current) drug therapy: Secondary | ICD-10-CM | POA: Insufficient documentation

## 2011-10-27 DIAGNOSIS — K219 Gastro-esophageal reflux disease without esophagitis: Secondary | ICD-10-CM | POA: Insufficient documentation

## 2011-10-27 DIAGNOSIS — M109 Gout, unspecified: Secondary | ICD-10-CM | POA: Diagnosis not present

## 2011-10-27 DIAGNOSIS — I10 Essential (primary) hypertension: Secondary | ICD-10-CM | POA: Insufficient documentation

## 2011-10-27 DIAGNOSIS — C679 Malignant neoplasm of bladder, unspecified: Secondary | ICD-10-CM

## 2011-10-27 HISTORY — DX: Pure hypercholesterolemia, unspecified: E78.00

## 2011-10-27 MED ORDER — ZOLPIDEM TARTRATE 5 MG PO TABS: 5.0000 mg | ORAL_TABLET | Freq: Every evening | ORAL | Status: DC | PRN

## 2011-10-27 NOTE — Telephone Encounter (Signed)
Is this ok to refill?...Raechel Chute

## 2011-10-27 NOTE — Progress Notes (Signed)
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 male.  He was noted to have an elevated PSA of 3.17 (on Proscar)  by his primary care physician, Dr. Debby Bud.  Accordingly, he was referred for evaluation in urology by Dr. Margarita Grizzle on 08/29/11,  digital rectal examination was performed, but palpation of the prostate was limited to the apex only.  The patient proceeded to transrectal ultrasound with 12 biopsies of the prostate on 09/25/11.  The prostate volume measured 60 cc.  Out of 12 core biopsies,4 were positive.  The maximum Gleason score was 4+3, and this was seen in right apex and left lateral apex.  Gleason's 3+3 was seen in the right lateral base and the right base.  The patient reviewed the biopsy results with his urologist and he has kindly been referred today for discussion of potential radiation treatment options.   PREVIOUS RADIATION THERAPY: No  PAST MEDICAL HISTORY:  has a past medical history of Whooping cough; Bladder cancer; Atrial fibrillation; Diabetes mellitus; Hypertension; DDD (degenerative disc disease); GERD (gastroesophageal reflux disease); Gout; Fluid overload; ACE-inhibitor cough; Shortness of breath; Aortic stenosis; Pulmonary hypertension; Hyperlipidemia; colonoscopy; Warfarin anticoagulation; Ejection fraction; Normal nuclear stress test; and Hypercholesterolemia.    PAST SURGICAL HISTORY: Past Surgical History  Procedure Date  . Lumbar laminectomy     '02  . Tur-bt '06, '09   . Bladder cancer biopsy 10/16/2010    negative for malignancy  . Bladder  microscopic 04/13/2007    high grade papillary urothelial lesions  . 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 Prostate biopsy 09/25/2011    GLEASON 3+3=6 AND 4+3=7    FAMILY HISTORY: family history includes Prostate cancer (age of onset:75) in his father.  SOCIAL HISTORY:  reports that he quit smoking about 36 years ago. His smoking use included Cigarettes. He smoked 1 pack per day. He has never used smokeless tobacco. He reports that he drinks about 2.5 ounces of alcohol per week. He reports that he does not use illicit drugs.  ALLERGIES: Review of patient's allergies indicates no known allergies.  MEDICATIONS:  Current Outpatient Prescriptions  Medication Sig Dispense Refill  . allopurinol (ZYLOPRIM) 300 MG tablet take 1 tablet by mouth once daily  90 tablet  3  . atorvastatin (LIPITOR) 20 MG tablet Take 20 mg by mouth daily.        . CELEBREX 200 MG capsule take 1 capsule by mouth once daily  90 capsule  3  . finasteride (PROSCAR) 5 MG tablet Take 5 mg by mouth daily.        . furosemide (LASIX) 40 MG tablet take 1 tablet by mouth once daily  90 tablet  3  . ketoconazole (NIZORAL) 2 % shampoo Apply 1 application topically Daily.      [name] Kitchen KLOR-CON M20 20 MEQ tablet take 1 tablet by mouth once daily  90 tablet  3  . Multiple Vitamins-Minerals (ONCOVITE PO) Take by mouth.        . pantoprazole (PROTONIX) 40 MG tablet take 1 tablet by mouth once  daily  90 tablet  3  . senna (SENOKOT) 8.6 MG tablet Take 1 tablet by mouth daily.        . sitaGLIPtin (JANUVIA) 100 MG tablet Take 1 tablet (100 mg total) by mouth daily.  30 tablet  0  . Tamsulosin HCl (FLOMAX) 0.4 MG CAPS Take 0.4 mg by mouth at bedtime.      . verapamil (COVERA HS) 240 MG (CO) 24 hr tablet Take 1 tablet (240 mg total) by mouth at bedtime.  90 tablet  3  . warfarin (COUMADIN) 4 MG tablet       . zolpidem (AMBIEN) 5 MG tablet take 1 tablet by mouth at bedtime if needed for sleep  90 tablet  0    . DISCONTD: warfarin (COUMADIN) 4 MG tablet take 1 tablet by mouth as directed  120 tablet  1  . JANUVIA 100 MG tablet take 1 tablet by mouth once daily  90 tablet  2  . verapamil (CALAN-SR) 240 MG CR tablet take 1 tablet by mouth at bedtime  90 tablet  2    REVIEW OF SYSTEMS:  A 15 point review of systems is documented in the electronic medical record. This was obtained by the nursing staff. However, I reviewed this with the patient to discuss relevant findings and make appropriate changes.  A comprehensive review of systems was negative.  IPSS=16   PHYSICAL EXAM:  weight is 238 lb 14.4 oz (108.364 kg). His temperature is 97.8 F (36.6 C). His blood pressure is 121/71 and his pulse is 61.   detailed exam not performed.  LABORATORY DATA:  Lab Results  Component Value Date   WBC 9.1 08/28/2011   HGB 13.6 08/28/2011   HCT 41.0 08/28/2011   MCV 97.7 08/28/2011   PLT 180.0 08/28/2011   Lab Results  Component Value Date   NA 139 08/28/2011   K 3.9 08/28/2011   CL 105 08/28/2011   CO2 29 08/28/2011   Lab Results  Component Value Date   ALT 19 08/28/2011   ALT 19 08/28/2011   AST 26 08/28/2011   AST 26 08/28/2011   ALKPHOS 56 08/28/2011   ALKPHOS 56 08/28/2011   BILITOT 1.0 08/28/2011   BILITOT 1.0 08/28/2011     RADIOGRAPHY: Dg Chest 2 View  10/02/2011  *RADIOLOGY REPORT*  Clinical Data: Atrial fibrillation, prior smoker, hypertension, prostate cancer  CHEST - 2 VIEW  Comparison: 10/15/2010  Findings: Hyperinflation noted compatible with background COPD/emphysema.  Right base scarring noted at the costophrenic angle.  Mild cardiac enlargement but normal vascularity.  Negative for CHF or pneumonia.  No pneumothorax.  Trachea midline. Atherosclerotic changes of the aorta.  Degenerative changes of the spine.  IMPRESSION: Stable chest exam.  No acute process  Original Report Authenticated By: Judie Petit. Ruel Favors, M.D.      IMPRESSION: Mr. Creger is a very nice 78 year old gentleman with stage TI C.  adenocarcinoma prostate with Gleason score 4+3 PSA of 3.17 (on Proscar). He is eligible for a variety of potential treatment options including external beam radiation therapy with or without neoadjuvant hormone therapy.  He is not an ideal candidate for prostate seed implant given his primary Gleason's grade 4 and also his enlarged prostate and prostatism symptoms.  PLAN: Today I reviewed the findings and workup thus far.  We discussed the natural history of prostate cancer.  We reviewed the the implications of T-stage, Gleason's Score, and PSA on decision-making and outcomes in prostate cancer.  We discussed  radiation treatment in the management of prostate cancer with regard to the logistics and delivery of external beam radiation treatment as well as the logistics and delivery of prostate brachytherapy.  We compared and contrasted each of these approaches and also compared these against prostatectomy.  The patient expressed interest in external beam radiotherapy.  I filled out a patient counseling form for him with relevant treatment diagrams and we retained a copy for our records.   The patient would like to proceed with prostate IMRT.  I will share my findings with Dr. Margarita Grizzle and move forward with scheduling placement of three gold fiducial markers into the prostate to proceed with IMRT in the near future.     In addition,, the patient talks to me about androgen deprivation in conjunction with radiation. We discussed the pros and cons of adding neoadjuvant Lupron therapy to radiation. Talked about some of the side effects associated with hormone therapy as well as the potential benefit in terms of disease control. The patient would like to talk further with Dr. Margarita Grizzle about androgen deprivation.  I enjoyed meeting with him today, and will look forward to participating in the care of this very nice gentleman.   I spent 60 minutes minutes face to face with the patient and more than 50% of that time  was spent in counseling and/or coordination of care.   ------------------------------------------------  Artist Pais. Kathrynn Running, M.D.

## 2011-10-27 NOTE — Progress Notes (Signed)
Please see the Nurse Progress Note in the MD Initial Consult Encounter for this patient. 

## 2011-10-27 NOTE — Progress Notes (Signed)
Patient for formal consultation of prostate cancer. IPSS 16 Gleason 3+3=6 right lat.base Gleason 4+3=7 right apex PSA 3.17 on 08/22/11.

## 2011-10-28 ENCOUNTER — Telehealth: Payer: Self-pay | Admitting: *Deleted

## 2011-10-28 NOTE — Telephone Encounter (Signed)
Faxed script back to [facility] 

## 2011-10-28 NOTE — Telephone Encounter (Signed)
CALLED PATIENT TO INFORM OF APPT. FOR LUPRON INJECTION ON 11-06-11- ARRIVAL TIME - 9:45 AM AT DR. Hilario Quarry OFFICE, AND HIS GOLD SEED PLACEMENT ON 02-02-12 - ARRIVAL TIME - 11:45 AM AT DR. JXBJYNWG'N OFFICE AND HIS SIM AT DR. MANNING'S OFFICE ON 02-06-12- AT 10:00 AM, NO ANSWER MAILED APPT. CARDS

## 2011-10-31 ENCOUNTER — Telehealth: Payer: Self-pay | Admitting: *Deleted

## 2011-10-31 NOTE — Addendum Note (Signed)
Encounter addended by: Delynn Flavin, RN on: 10/31/2011  5:25 PM<BR>     Documentation filed: Charges VN

## 2011-10-31 NOTE — Telephone Encounter (Signed)
CALLED PATIENT TO INFORM OF GOLD SEED FOR 02/12/12  10:00 AM AT THE UROLOGIST'S OFFICE, AND 02/20/12 AT 10:00 AM AT DR. MANNING'S OFFICE, SPOKE WITH PATIENT AND HE IS AWARE OF THESE APPTS.

## 2011-10-31 NOTE — Addendum Note (Signed)
Encounter addended by: Delynn Flavin, RN on: 10/31/2011  5:22 PM<BR>     Documentation filed: Charges VN

## 2011-11-04 ENCOUNTER — Telehealth: Payer: Self-pay | Admitting: *Deleted

## 2011-11-04 ENCOUNTER — Ambulatory Visit (INDEPENDENT_AMBULATORY_CARE_PROVIDER_SITE_OTHER): Payer: Medicare Other | Admitting: Internal Medicine

## 2011-11-04 ENCOUNTER — Encounter: Payer: Self-pay | Admitting: Internal Medicine

## 2011-11-04 VITALS — BP 124/62 | HR 85 | Temp 98.0°F | Resp 16 | Wt 237.0 lb

## 2011-11-04 DIAGNOSIS — C61 Malignant neoplasm of prostate: Secondary | ICD-10-CM | POA: Diagnosis not present

## 2011-11-04 NOTE — Telephone Encounter (Signed)
Telephoned pt and made him aware of Dr Henrietta Hoover instructions. He was instructed to take last dose on 02/04/12 and restart day of procedure. Instructed pt to make Dr Margarita Grizzle aware of our recommendations. Will fax info to [facility].

## 2011-11-04 NOTE — Telephone Encounter (Signed)
Message copied by MUSE, Franchot Mimes on Tue Nov 04, 2011 11:59 AM ------      Message from: Myrtis Ser, [place] D      Created: Mon Nov 03, 2011 11:12 AM      Regarding: RE: Clearance for Seed implanation       Pt. can be off coumadin for 7 days before the procedure. Plan to restart the day of the procedure.      ----- Message -----         From: Raul Del, RN         Sent: 10/31/2011   9:44 AM           To: Luis Abed, MD      Subject: Clearance for Seed implanation                           Pt states he is having Seed implantation on 02/12/12 with Dr Margarita Grizzle at [facility]. He needs to be off coumadin but Dr Margarita Grizzle is leaving the length recommendation up to [place] per patient.

## 2011-11-04 NOTE — Progress Notes (Signed)
Subjective:    Patient ID: Eric Rivers, male    DOB: 04/19/1930, 78 y.o.   MRN: [id number]  HPI Eric Rivers has been diagnosed with prostate cancer wit Gleason score of 7. He has had a lupron injection, has been seen by Dr. Kathrynn Running for XRT and will have gold seed implants and 40 XRT treatments when he returns from his cruise in December. He good with his decision. He is requesting a letter - a narrative of his health history to take on his cruise.  Past Medical History  Diagnosis Date  . Whooping cough   . Bladder cancer     recurrence with TUR-B March '09  . Atrial fibrillation      Chronic,   24 hour holter, September, 2011.... atrial fib rate is controlled.... there is some bradycardia but  no marked pauses  . Diabetes mellitus     type 2  . Hypertension   . DDD (degenerative disc disease)     lumbar spine  . GERD (gastroesophageal reflux disease)   . Gout   . Fluid overload   . ACE-inhibitor cough   . Shortness of breath     on exertion  . Aortic stenosis     mild....echo... september... 2010/ mild... echo.[name] KitchenMarland KitchenDec, 2011  . Pulmonary hypertension     , echo, 01/2010  . Hyperlipidemia   . Hx of colonoscopy     approx. 10 years  . Warfarin anticoagulation     Atrial fib  . Ejection fraction     EF 60%, echo, 01/2010  . Normal nuclear stress test     06/2005 , also ABI normal 2007  . Hypercholesterolemia    Past Surgical History  Procedure Date  . Lumbar laminectomy     '02  . Tur-bt '06, '09   . Bladder cancer biopsy 10/16/2010    negative for malignancy  . Bladder microscopic 04/13/2007    high grade papillary urothelial lesions  . 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 Prostate biopsy 09/25/2011    GLEASON 3+3=6 AND 4+3=7   Family History  Problem Relation Age of Onset  . Prostate cancer Father 102    passed with prostate ca   History   Social History  . Marital Status: Single    Spouse Name: N/A    Number of  Children: 0  . Years of Education: 1   Occupational History  . teacher     retired   Social History Main Topics  . Smoking status: Former Smoker -- 1.0 packs/day    Types: Cigarettes    Quit date: 03/04/1975  . Smokeless tobacco: Never Used  . Alcohol Use: 2.5 oz/week    5 drink(s) per week  . Drug Use: No  . Sexually Active: Not Currently   Other Topics Concern  . Not on file   Social History Narrative   Confirmed batchelor. Retired Engineer, site. World traveler- Geologist, engineering. I- ADLS. End of life CareNo CPR, no prolonged intubation, no prolonged artificial hydration or feeding, no heroic or futile measures. [place] trip Sept [place]13 [place]    Current Outpatient Prescriptions on File Prior to Visit  Medication Sig Dispense Refill  . allopurinol (ZYLOPRIM) 300 MG tablet take 1 tablet by mouth once daily  90 tablet  3  . atorvastatin (LIPITOR) 20 MG tablet Take 20 mg by mouth daily.        [name] Kitchen  CELEBREX 200 MG capsule take 1 capsule by mouth once daily  90 capsule  3  . finasteride (PROSCAR) 5 MG tablet Take 5 mg by mouth daily.        . furosemide (LASIX) 40 MG tablet take 1 tablet by mouth once daily  90 tablet  3  . JANUVIA 100 MG tablet take 1 tablet by mouth once daily  90 tablet  2  . ketoconazole (NIZORAL) 2 % shampoo Apply 1 application topically Daily.      [name] Kitchen KLOR-CON M20 20 MEQ tablet take 1 tablet by mouth once daily  90 tablet  3  . Multiple Vitamins-Minerals (ONCOVITE PO) Take by mouth.        . pantoprazole (PROTONIX) 40 MG tablet take 1 tablet by mouth once daily  90 tablet  3  . senna (SENOKOT) 8.6 MG tablet Take 1 tablet by mouth daily.        . sitaGLIPtin (JANUVIA) 100 MG tablet Take 1 tablet (100 mg total) by mouth daily.  30 tablet  0  . Tamsulosin HCl (FLOMAX) 0.4 MG CAPS Take 0.4 mg by mouth at bedtime.      . verapamil (COVERA HS) 240 MG (CO) 24 hr tablet Take 1 tablet (240 mg total) by mouth at bedtime.  90 tablet  3  .  warfarin (COUMADIN) 4 MG tablet       . zolpidem (AMBIEN) 5 MG tablet Take 1 tablet (5 mg total) by mouth at bedtime as needed for sleep.  90 tablet  0  . verapamil (CALAN-SR) 240 MG CR tablet take 1 tablet by mouth at bedtime  90 tablet  2      Review of Systems System review is negative for any constitutional, cardiac, pulmonary, GI or neuro symptoms or complaints other than as described in the HPI.     Objective:   Physical Exam Filed Vitals:   11/04/11 1050  BP: 124/62  Pulse: 85  Temp: 98 F (36.7 C)  Resp: 16   gen'l - very pleasant man in no distress No physical exam       Assessment & Plan:

## 2011-11-05 ENCOUNTER — Encounter: Payer: Self-pay | Admitting: General Practice

## 2011-11-10 ENCOUNTER — Other Ambulatory Visit: Payer: Self-pay | Admitting: Internal Medicine

## 2011-11-10 DIAGNOSIS — E1159 Type 2 diabetes mellitus with other circulatory complications: Secondary | ICD-10-CM | POA: Diagnosis not present

## 2011-11-10 DIAGNOSIS — L608 Other nail disorders: Secondary | ICD-10-CM | POA: Diagnosis not present

## 2011-11-10 DIAGNOSIS — I739 Peripheral vascular disease, unspecified: Secondary | ICD-10-CM | POA: Diagnosis not present

## 2011-11-10 DIAGNOSIS — L84 Corns and callosities: Secondary | ICD-10-CM | POA: Diagnosis not present

## 2011-11-10 DIAGNOSIS — Z23 Encounter for immunization: Secondary | ICD-10-CM | POA: Diagnosis not present

## 2011-11-11 ENCOUNTER — Ambulatory Visit (INDEPENDENT_AMBULATORY_CARE_PROVIDER_SITE_OTHER): Payer: Medicare Other | Admitting: *Deleted

## 2011-11-11 DIAGNOSIS — I359 Nonrheumatic aortic valve disorder, unspecified: Secondary | ICD-10-CM

## 2011-11-11 DIAGNOSIS — I4891 Unspecified atrial fibrillation: Secondary | ICD-10-CM

## 2011-11-11 LAB — POCT INR: INR: 3.1

## 2011-11-14 DIAGNOSIS — C61 Malignant neoplasm of prostate: Secondary | ICD-10-CM | POA: Diagnosis not present

## 2011-11-14 DIAGNOSIS — M899 Disorder of bone, unspecified: Secondary | ICD-10-CM | POA: Diagnosis not present

## 2011-11-25 NOTE — Addendum Note (Signed)
Encounter addended by: Delynn Flavin, RN on: 11/25/2011 11:49 AM<BR>     Documentation filed: Charges VN

## 2011-11-30 DIAGNOSIS — S0180XA Unspecified open wound of other part of head, initial encounter: Secondary | ICD-10-CM | POA: Diagnosis not present

## 2011-11-30 DIAGNOSIS — Z7901 Long term (current) use of anticoagulants: Secondary | ICD-10-CM | POA: Diagnosis not present

## 2011-11-30 DIAGNOSIS — S0003XA Contusion of scalp, initial encounter: Secondary | ICD-10-CM | POA: Diagnosis not present

## 2011-11-30 DIAGNOSIS — S0990XA Unspecified injury of head, initial encounter: Secondary | ICD-10-CM | POA: Diagnosis not present

## 2011-11-30 DIAGNOSIS — I10 Essential (primary) hypertension: Secondary | ICD-10-CM | POA: Diagnosis not present

## 2011-11-30 DIAGNOSIS — S1093XA Contusion of unspecified part of neck, initial encounter: Secondary | ICD-10-CM | POA: Diagnosis not present

## 2011-11-30 DIAGNOSIS — M25549 Pain in joints of unspecified hand: Secondary | ICD-10-CM | POA: Diagnosis not present

## 2011-11-30 DIAGNOSIS — S62309A Unspecified fracture of unspecified metacarpal bone, initial encounter for closed fracture: Secondary | ICD-10-CM

## 2011-11-30 DIAGNOSIS — S62319A Displaced fracture of base of unspecified metacarpal bone, initial encounter for closed fracture: Secondary | ICD-10-CM | POA: Diagnosis not present

## 2011-11-30 DIAGNOSIS — T07XXXA Unspecified multiple injuries, initial encounter: Secondary | ICD-10-CM | POA: Diagnosis not present

## 2011-11-30 DIAGNOSIS — W010XXA Fall on same level from slipping, tripping and stumbling without subsequent striking against object, initial encounter: Secondary | ICD-10-CM | POA: Diagnosis not present

## 2011-11-30 HISTORY — DX: Unspecified fracture of unspecified metacarpal bone, initial encounter for closed fracture: S62.309A

## 2011-12-01 NOTE — Assessment & Plan Note (Signed)
Newly diagnosed prostate cancer IIA. HIs father was diagnosed late in life and did die from complications related to prostate cancer.  Plan - he will undergo treatment when he returns from his extended 12 week cruise.

## 2012-01-13 ENCOUNTER — Telehealth: Payer: Self-pay | Admitting: *Deleted

## 2012-01-13 NOTE — Telephone Encounter (Signed)
CALLED PATIENT TO INFORM OF SIM APPT. BEING MOVED TO 02-20-12 AT 2:00 PM, LVM FOR A RETURN CALL

## 2012-01-28 ENCOUNTER — Other Ambulatory Visit: Payer: Self-pay | Admitting: Internal Medicine

## 2012-01-30 ENCOUNTER — Other Ambulatory Visit: Payer: Self-pay | Admitting: *Deleted

## 2012-01-30 MED ORDER — ZOLPIDEM TARTRATE 5 MG PO TABS: 5.0000 mg | ORAL_TABLET | Freq: Every evening | ORAL | Status: DC | PRN

## 2012-02-06 ENCOUNTER — Other Ambulatory Visit: Payer: Self-pay | Admitting: Radiation Oncology

## 2012-02-10 ENCOUNTER — Ambulatory Visit (INDEPENDENT_AMBULATORY_CARE_PROVIDER_SITE_OTHER): Payer: Medicare Other | Admitting: Cardiology

## 2012-02-10 ENCOUNTER — Encounter: Payer: Self-pay | Admitting: Cardiology

## 2012-02-10 VITALS — BP 126/78 | HR 67 | Ht 68.0 in | Wt 242.0 lb

## 2012-02-10 DIAGNOSIS — C61 Malignant neoplasm of prostate: Secondary | ICD-10-CM

## 2012-02-10 DIAGNOSIS — I359 Nonrheumatic aortic valve disorder, unspecified: Secondary | ICD-10-CM

## 2012-02-10 DIAGNOSIS — I4891 Unspecified atrial fibrillation: Secondary | ICD-10-CM | POA: Diagnosis not present

## 2012-02-10 DIAGNOSIS — I350 Nonrheumatic aortic (valve) stenosis: Secondary | ICD-10-CM

## 2012-02-10 DIAGNOSIS — Z7901 Long term (current) use of anticoagulants: Secondary | ICD-10-CM

## 2012-02-10 DIAGNOSIS — I482 Chronic atrial fibrillation, unspecified: Secondary | ICD-10-CM

## 2012-02-10 NOTE — Patient Instructions (Addendum)
Your physician wants you to follow-up in:  6 months. You will receive a reminder letter in the mail two months in advance. If you don't receive a letter, please call our office to schedule the follow-up appointment.   

## 2012-02-10 NOTE — Assessment & Plan Note (Signed)
Coumadin is continued. No change in therapy. 

## 2012-02-10 NOTE — Assessment & Plan Note (Signed)
He has mild aortic stenosis. He does not need an echo now.

## 2012-02-10 NOTE — Assessment & Plan Note (Signed)
He has a recent diagnosis of prostate cancer. This is being treated.

## 2012-02-10 NOTE — Assessment & Plan Note (Signed)
HR fibrillation is stable. Coumadin is continued. No change in therapy.

## 2012-02-10 NOTE — Progress Notes (Signed)
HPI  Patient is seen today to followup atrial fibrillation. He has been stable. He went on a 75 day cruise. He went to [place] in many places and had a wonderful time. He did fall on one occasion with an injury to his head and his right wrist. There was no syncope. Despite his anticoagulation he did well. He is very careful with his salt intake and he is stable.  No Known Allergies  Current Outpatient Prescriptions  Medication Sig Dispense Refill  . allopurinol (ZYLOPRIM) 300 MG tablet take 1 tablet by mouth once daily  90 tablet  3  . atorvastatin (LIPITOR) 20 MG tablet Take 20 mg by mouth daily.        [name] Kitchen atorvastatin (LIPITOR) 20 MG tablet take 1 tablet by mouth once daily  90 tablet  3  . Calcium Carbonate-Vitamin D (CALCIUM + D PO) Take 1 tablet by mouth daily.      . CELEBREX 200 MG capsule take 1 capsule by mouth once daily  90 capsule  3  . finasteride (PROSCAR) 5 MG tablet Take 5 mg by mouth daily.        . furosemide (LASIX) 40 MG tablet take 1 tablet by mouth once daily  90 tablet  3  . JANUVIA 100 MG tablet take 1 tablet by mouth once daily  90 tablet  2  . ketoconazole (NIZORAL) 2 % shampoo Apply 1 application topically Daily.      [name] Kitchen KLOR-CON M20 20 MEQ tablet take 1 tablet by mouth once daily  90 tablet  3  . Multiple Vitamins-Minerals (ONCOVITE PO) Take by mouth.        . pantoprazole (PROTONIX) 40 MG tablet take 1 tablet by mouth once daily  90 tablet  3  . senna (SENOKOT) 8.6 MG tablet Take 1 tablet by mouth daily.        . sitaGLIPtin (JANUVIA) 100 MG tablet Take 1 tablet (100 mg total) by mouth daily.  30 tablet  0  . Tamsulosin HCl (FLOMAX) 0.4 MG CAPS Take 0.4 mg by mouth at bedtime.      . verapamil (COVERA HS) 240 MG (CO) 24 hr tablet Take 240 mg by mouth every morning.      . warfarin (COUMADIN) 4 MG tablet       . zolpidem (AMBIEN) 5 MG tablet Take 1 tablet (5 mg total) by mouth at bedtime as needed for sleep.  30 tablet  0  . [DISCONTINUED] verapamil (COVERA HS)  240 MG (CO) 24 hr tablet Take 1 tablet (240 mg total) by mouth at bedtime.  90 tablet  3  . [DISCONTINUED] verapamil (CALAN-SR) 240 MG CR tablet take 1 tablet by mouth at bedtime  90 tablet  2    History   Social History  . Marital Status: Single    Spouse Name: N/A    Number of Children: 0  . Years of Education: 12   Occupational History  . teacher     retired   Social History Main Topics  . Smoking status: Former Smoker -- 1.0 packs/day    Types: Cigarettes    Quit date: 03/04/1975  . Smokeless tobacco: Never Used  . Alcohol Use: 2.5 oz/week    5 drink(s) per week  . Drug Use: No  . Sexually Active: Not Currently   Other Topics Concern  . Not on file   Social History Narrative   Confirmed batchelor. Retired Engineer, site. World traveler- Geologist, engineering. I- ADLS.  End of life CareNo CPR, no prolonged intubation, no prolonged artificial hydration or feeding, no heroic or futile measures. [place] Sept [place]13 [place]    Family History  Problem Relation Age of Onset  . Prostate cancer Father 53    passed with prostate ca    Past Medical History  Diagnosis Date  . Whooping cough   . Bladder cancer     recurrence with TUR-B March '09  . Atrial fibrillation      Chronic,   24 hour holter, September, 2011.... atrial fib rate is controlled.... there is some bradycardia but  no marked pauses  . Diabetes mellitus     type 2  . Hypertension   . DDD (degenerative disc disease)     lumbar spine  . GERD (gastroesophageal reflux disease)   . Gout   . Fluid overload   . ACE-inhibitor cough   . Shortness of breath     on exertion  . Aortic stenosis     mild....echo... september... 2010/ mild... echo.[name] KitchenMarland KitchenDec, 2011  . Pulmonary hypertension     , echo, 01/2010  . Hyperlipidemia   . Hx of colonoscopy     approx. 10 years  . Warfarin anticoagulation     Atrial fib  . Ejection fraction     EF 60%, echo, 01/2010  . Normal nuclear  stress test     06/2005 , also ABI normal 2007  . Hypercholesterolemia     Past Surgical History  Procedure Date  . Lumbar laminectomy     '02  . Tur-bt '06, '09   . Bladder cancer biopsy 10/16/2010    negative for malignancy  . Bladder microscopic 04/13/2007    high grade papillary urothelial lesions  . 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 Prostate biopsy 09/25/2011    GLEASON 3+3=6 AND 4+3=7    Patient Active Problem List  Diagnosis  . NEOP, MALIGNANT, BLADDER NEC  . DIABETES MELLITUS, TYPE II  . HYPERTROPHY PROSTATE W/O UR OBST & OTH LUTS  . DEGENERATIVE JOINT DISEASE, GENERALIZED  . Bladder cancer  . Gout  . Chronic atrial fibrillation  . Routine general medical examination at a health care facility  . Warfarin anticoagulation  . Hypertension  . GERD (gastroesophageal reflux disease)  . ACE-inhibitor cough  . Shortness of breath  . Aortic stenosis  . Pulmonary hypertension  . Hyperlipidemia  . Ejection fraction  . Normal nuclear stress test  . Malignant neoplasm of prostate    ROS   Patient denies fever, chills, headache, sweats, rash, change in vision, change in hearing, chest pain, cough, nausea vomiting, urinary symptoms. All other systems are reviewed and are negative.  PHYSICAL EXAM   Patient is oriented to person time and place. Affect is normal. There is no jugulovenous distention. He is overweight. Lungs are clear. Respiratory effort is nonlabored. Cardiac exam reveals S1 and S2 .  There is a systolic murmur.The rhythm is irregularly irregular the abdomen is protuberant but soft. There is no peripheral edema.  Filed Vitals:   02/10/12 1030  BP: 126/78  Pulse: 67  Height: 5\' 8"  (1.727 m)  Weight: 242 lb (109.77 kg)   EKG is done today and reviewed by me. There is old atrial fibrillation. There is no change.  ASSESSMENT & PLAN

## 2012-02-12 DIAGNOSIS — IMO0002 Reserved for concepts with insufficient information to code with codable children: Secondary | ICD-10-CM | POA: Diagnosis not present

## 2012-02-12 DIAGNOSIS — C61 Malignant neoplasm of prostate: Secondary | ICD-10-CM | POA: Diagnosis not present

## 2012-02-13 DIAGNOSIS — I872 Venous insufficiency (chronic) (peripheral): Secondary | ICD-10-CM | POA: Diagnosis not present

## 2012-02-16 DIAGNOSIS — L84 Corns and callosities: Secondary | ICD-10-CM | POA: Diagnosis not present

## 2012-02-16 DIAGNOSIS — E1159 Type 2 diabetes mellitus with other circulatory complications: Secondary | ICD-10-CM | POA: Diagnosis not present

## 2012-02-16 DIAGNOSIS — I739 Peripheral vascular disease, unspecified: Secondary | ICD-10-CM | POA: Diagnosis not present

## 2012-02-16 DIAGNOSIS — L608 Other nail disorders: Secondary | ICD-10-CM | POA: Diagnosis not present

## 2012-02-17 ENCOUNTER — Ambulatory Visit (INDEPENDENT_AMBULATORY_CARE_PROVIDER_SITE_OTHER): Payer: Medicare Other | Admitting: General Practice

## 2012-02-17 DIAGNOSIS — I4891 Unspecified atrial fibrillation: Secondary | ICD-10-CM | POA: Diagnosis not present

## 2012-02-17 DIAGNOSIS — I359 Nonrheumatic aortic valve disorder, unspecified: Secondary | ICD-10-CM

## 2012-02-17 LAB — POCT INR: INR: 1.4

## 2012-02-19 DIAGNOSIS — Z8551 Personal history of malignant neoplasm of bladder: Secondary | ICD-10-CM | POA: Diagnosis not present

## 2012-02-19 DIAGNOSIS — R82998 Other abnormal findings in urine: Secondary | ICD-10-CM | POA: Diagnosis not present

## 2012-02-19 DIAGNOSIS — C679 Malignant neoplasm of bladder, unspecified: Secondary | ICD-10-CM | POA: Diagnosis not present

## 2012-02-20 ENCOUNTER — Ambulatory Visit: Payer: Medicare Other | Admitting: Radiation Oncology

## 2012-02-20 ENCOUNTER — Telehealth: Payer: Self-pay | Admitting: Radiation Oncology

## 2012-02-20 ENCOUNTER — Encounter: Payer: Self-pay | Admitting: Radiation Oncology

## 2012-02-20 ENCOUNTER — Ambulatory Visit
Admission: RE | Admit: 2012-02-20 | Discharge: 2012-02-20 | Disposition: A | Discharge status: Home / Self Care | Payer: Medicare Other | Source: Ambulatory Visit | Attending: Radiation Oncology | Admitting: Radiation Oncology

## 2012-02-20 DIAGNOSIS — E7800 Pure hypercholesterolemia, unspecified: Secondary | ICD-10-CM | POA: Insufficient documentation

## 2012-02-20 DIAGNOSIS — Z7901 Long term (current) use of anticoagulants: Secondary | ICD-10-CM | POA: Diagnosis not present

## 2012-02-20 DIAGNOSIS — M109 Gout, unspecified: Secondary | ICD-10-CM | POA: Insufficient documentation

## 2012-02-20 DIAGNOSIS — E119 Type 2 diabetes mellitus without complications: Secondary | ICD-10-CM | POA: Insufficient documentation

## 2012-02-20 DIAGNOSIS — K219 Gastro-esophageal reflux disease without esophagitis: Secondary | ICD-10-CM | POA: Diagnosis not present

## 2012-02-20 DIAGNOSIS — Z79899 Other long term (current) drug therapy: Secondary | ICD-10-CM | POA: Insufficient documentation

## 2012-02-20 DIAGNOSIS — E785 Hyperlipidemia, unspecified: Secondary | ICD-10-CM | POA: Insufficient documentation

## 2012-02-20 DIAGNOSIS — Z87891 Personal history of nicotine dependence: Secondary | ICD-10-CM | POA: Diagnosis not present

## 2012-02-20 DIAGNOSIS — I10 Essential (primary) hypertension: Secondary | ICD-10-CM | POA: Insufficient documentation

## 2012-02-20 DIAGNOSIS — C61 Malignant neoplasm of prostate: Secondary | ICD-10-CM

## 2012-02-20 NOTE — Progress Notes (Signed)
  Radiation Oncology         [phone]) [phone] ________________________________  Name: Eric Rivers MRN: [id number]  Date: 02/20/2012  DOB: 10-22-1930  SIMULATION AND TREATMENT PLANNING NOTE  DIAGNOSIS:  77 year old man with adenocarcinoma of the prostate with a Gleason's score of 4+3 and a PSA of 3.17(on Proscar)  NARRATIVE:  The patient was brought to the CT Simulation planning suite.  Identity was confirmed.  All relevant records and images related to the planned course of therapy were reviewed.  The patient freely provided informed written consent to proceed with treatment after reviewing the details related to the planned course of therapy. The consent form was witnessed and verified by the simulation staff.  Then, the patient was set-up in a stable reproducible supine position for radiation therapy.  A vacuum lock pillow device was custom fabricated to position his legs in a reproducible immobilized position.  Then, I performed a urethrogram under sterile conditions to identify the prostatic apex.  CT images were obtained.  Surface markings were placed.  The CT images were loaded into the planning software.  Then the prostate target and avoidance structures including the rectum, bladder, bowel and hips were contoured.  Treatment planning then occurred.  The radiation prescription was entered and confirmed.  A total of one complex treatment device was fabricated. I have requested : Intensity Modulated Radiotherapy (IMRT) is medically necessary for this case for the following reason:  Rectal sparing.[name] Kitchen  PLAN:  The patient will receive 78 Gy in 40 fractions.  ________________________________  Artist Pais Kathrynn Running, M.D.

## 2012-02-20 NOTE — Telephone Encounter (Signed)
Met w patient to discuss RO billing. Pt had no financial concerns today.  Dx: 185 Prostate  Attending Rad: MM  Rad Tx: IMRT x40

## 2012-02-24 ENCOUNTER — Encounter: Payer: Self-pay | Admitting: Internal Medicine

## 2012-02-24 ENCOUNTER — Ambulatory Visit (INDEPENDENT_AMBULATORY_CARE_PROVIDER_SITE_OTHER): Payer: Medicare Other | Admitting: Internal Medicine

## 2012-02-24 ENCOUNTER — Other Ambulatory Visit (INDEPENDENT_AMBULATORY_CARE_PROVIDER_SITE_OTHER): Payer: Medicare Other

## 2012-02-24 VITALS — BP 122/68 | HR 83 | Temp 98.4°F | Resp 10 | Wt 240.1 lb

## 2012-02-24 DIAGNOSIS — E119 Type 2 diabetes mellitus without complications: Secondary | ICD-10-CM

## 2012-02-24 DIAGNOSIS — C61 Malignant neoplasm of prostate: Secondary | ICD-10-CM

## 2012-02-24 LAB — COMPREHENSIVE METABOLIC PANEL
ALT: 19 U/L (ref 0–53)
AST: 27 U/L (ref 0–37)
Chloride: 103 mEq/L (ref 96–112)
Creatinine, Ser: 0.8 mg/dL (ref 0.4–1.5)
Total Bilirubin: 0.8 mg/dL (ref 0.3–1.2)

## 2012-02-24 MED ORDER — ZOLPIDEM TARTRATE 5 MG PO TABS: 5.0000 mg | ORAL_TABLET | Freq: Every evening | ORAL | Status: DC | PRN
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
## 2012-02-24 NOTE — Patient Instructions (Addendum)
1. Ortho - the hand appears to have healed will only minimal decreased ROM 4th digit. Plan - no follow up needed.  2. Dermatology - the skin changes appear to be related to some venous insufficiency and support hose will help. Does not appear to be infected at this time  Glad you weren't injured worse.  3. GU - for XRT for prostate cancer - you will do fine. Call me if I can be of any help in this regard.

## 2012-02-26 NOTE — Assessment & Plan Note (Signed)
To begin XRT soon - 40 treatments in 8 weeks.

## 2012-02-27 ENCOUNTER — Ambulatory Visit (INDEPENDENT_AMBULATORY_CARE_PROVIDER_SITE_OTHER): Payer: Medicare Other | Admitting: General Practice

## 2012-02-27 DIAGNOSIS — I359 Nonrheumatic aortic valve disorder, unspecified: Secondary | ICD-10-CM

## 2012-02-27 DIAGNOSIS — I4891 Unspecified atrial fibrillation: Secondary | ICD-10-CM | POA: Diagnosis not present

## 2012-03-01 DIAGNOSIS — C61 Malignant neoplasm of prostate: Secondary | ICD-10-CM | POA: Diagnosis not present

## 2012-03-04 ENCOUNTER — Ambulatory Visit: Admission: RE | Admit: 2012-03-04 | Payer: Medicare Other | Source: Ambulatory Visit | Admitting: Radiation Oncology

## 2012-03-04 ENCOUNTER — Ambulatory Visit
Admission: RE | Admit: 2012-03-04 | Discharge: 2012-03-04 | Disposition: A | Discharge status: Home / Self Care | Payer: Medicare Other | Source: Ambulatory Visit | Attending: Radiation Oncology | Admitting: Radiation Oncology

## 2012-03-04 DIAGNOSIS — C61 Malignant neoplasm of prostate: Secondary | ICD-10-CM

## 2012-03-04 DIAGNOSIS — Z79899 Other long term (current) drug therapy: Secondary | ICD-10-CM | POA: Diagnosis not present

## 2012-03-04 DIAGNOSIS — K219 Gastro-esophageal reflux disease without esophagitis: Secondary | ICD-10-CM | POA: Diagnosis not present

## 2012-03-04 DIAGNOSIS — I10 Essential (primary) hypertension: Secondary | ICD-10-CM | POA: Diagnosis not present

## 2012-03-04 DIAGNOSIS — E7800 Pure hypercholesterolemia, unspecified: Secondary | ICD-10-CM | POA: Diagnosis not present

## 2012-03-04 DIAGNOSIS — E119 Type 2 diabetes mellitus without complications: Secondary | ICD-10-CM | POA: Diagnosis not present

## 2012-03-04 DIAGNOSIS — E785 Hyperlipidemia, unspecified: Secondary | ICD-10-CM | POA: Diagnosis not present

## 2012-03-04 DIAGNOSIS — M109 Gout, unspecified: Secondary | ICD-10-CM | POA: Diagnosis not present

## 2012-03-04 DIAGNOSIS — Z87891 Personal history of nicotine dependence: Secondary | ICD-10-CM | POA: Diagnosis not present

## 2012-03-04 DIAGNOSIS — Z7901 Long term (current) use of anticoagulants: Secondary | ICD-10-CM | POA: Diagnosis not present

## 2012-03-05 ENCOUNTER — Ambulatory Visit: Payer: Medicare Other

## 2012-03-05 ENCOUNTER — Ambulatory Visit
Admission: RE | Admit: 2012-03-05 | Discharge: 2012-03-05 | Disposition: A | Discharge status: Home / Self Care | Payer: Medicare Other | Source: Ambulatory Visit | Attending: Radiation Oncology | Admitting: Radiation Oncology

## 2012-03-05 ENCOUNTER — Encounter: Payer: Self-pay | Admitting: Radiation Oncology

## 2012-03-05 VITALS — BP 143/94 | HR 58 | Temp 97.8°F | Resp 20 | Wt 240.1 lb

## 2012-03-05 DIAGNOSIS — C61 Malignant neoplasm of prostate: Secondary | ICD-10-CM

## 2012-03-05 DIAGNOSIS — I10 Essential (primary) hypertension: Secondary | ICD-10-CM | POA: Diagnosis not present

## 2012-03-05 DIAGNOSIS — K219 Gastro-esophageal reflux disease without esophagitis: Secondary | ICD-10-CM | POA: Diagnosis not present

## 2012-03-05 DIAGNOSIS — E119 Type 2 diabetes mellitus without complications: Secondary | ICD-10-CM | POA: Diagnosis not present

## 2012-03-05 DIAGNOSIS — M109 Gout, unspecified: Secondary | ICD-10-CM | POA: Diagnosis not present

## 2012-03-05 DIAGNOSIS — E785 Hyperlipidemia, unspecified: Secondary | ICD-10-CM | POA: Diagnosis not present

## 2012-03-05 NOTE — Progress Notes (Signed)
  Radiation Oncology         [id number]) [phone] ________________________________  Name: Eric Rivers MRN: [id number]  Date: 03/05/2012  DOB: 28-Sep-1930  Weekly Radiation Therapy Management  Current Dose: 3.9 Gy     Planned Dose:  78 Gy  Narrative . . . . . . . . The patient presents for routine under treatment assessment.                                            Patient here for weekly rad txs, prostate 2/40 completed, increased frequency, nocturia x5, on flomax daily, no dysuria, regular bowel movements, patient education given,radiation therapy and you book, buisness card , alert,oriented x3, patient more concerned of fatigue more than anything else, stated, His father treated for prostate 21 years ago, , and fatigue was in his 74's and very fatigued, discussed this and side effects, sign,symptoms to report, patient doesn't think flomax is helping, Patient has 2 cocktails at dinner, water with dinner, and then stops Drinking by 630pm, gets up every 1/2 hour after 130-2am                                 Set-up films were reviewed.                                 The chart was checked. Physical Findings. . .  weight is 240 lb 1.6 oz (108.909 kg). His oral temperature is 97.8 F (36.6 C). His blood pressure is 143/94 and his pulse is 58. His respiration is 20. . Weight essentially stable.  No significant changes. Impression . . . . . . . The patient is  tolerating radiation. Plan . . . . . . . . . . . . Continue treatment as planned.  ________________________________  Artist Pais. Kathrynn Running, M.D.

## 2012-03-05 NOTE — Progress Notes (Signed)
Patient here for weekly rad txs, prostate 2/40 completed, increased frequency, nocturia x5, on flomax daily, no dysuria, regular bowel movements, patient education given,radiation therapy and you book, buisness card , alert,oriented x3, patient more concerned of fatigue more than anything else, stated,  His father treated for prostate 21 years ago, , and fatigue was in his 92's and very fatigued, discussed this and side effects, sign,symptoms to report, patient doesn't think flomax is helping,  Patient has 2 cocktails at dinner, water with dinner, and then stops  Drinking by 630pm, gets up every 1/2 hour after 130-2am 11:59 AM

## 2012-03-08 ENCOUNTER — Ambulatory Visit
Admission: RE | Admit: 2012-03-08 | Discharge: 2012-03-08 | Disposition: A | Discharge status: Home / Self Care | Payer: Medicare Other | Source: Ambulatory Visit | Attending: Radiation Oncology | Admitting: Radiation Oncology

## 2012-03-08 ENCOUNTER — Ambulatory Visit: Payer: Medicare Other

## 2012-03-08 DIAGNOSIS — E119 Type 2 diabetes mellitus without complications: Secondary | ICD-10-CM | POA: Diagnosis not present

## 2012-03-08 DIAGNOSIS — I10 Essential (primary) hypertension: Secondary | ICD-10-CM | POA: Diagnosis not present

## 2012-03-08 DIAGNOSIS — M109 Gout, unspecified: Secondary | ICD-10-CM | POA: Diagnosis not present

## 2012-03-08 DIAGNOSIS — E785 Hyperlipidemia, unspecified: Secondary | ICD-10-CM | POA: Diagnosis not present

## 2012-03-08 DIAGNOSIS — K219 Gastro-esophageal reflux disease without esophagitis: Secondary | ICD-10-CM | POA: Diagnosis not present

## 2012-03-08 DIAGNOSIS — C61 Malignant neoplasm of prostate: Secondary | ICD-10-CM | POA: Diagnosis not present

## 2012-03-09 ENCOUNTER — Ambulatory Visit
Admission: RE | Admit: 2012-03-09 | Discharge: 2012-03-09 | Disposition: A | Discharge status: Home / Self Care | Payer: Medicare Other | Source: Ambulatory Visit | Attending: Radiation Oncology | Admitting: Radiation Oncology

## 2012-03-09 ENCOUNTER — Ambulatory Visit: Payer: Medicare Other

## 2012-03-09 DIAGNOSIS — C61 Malignant neoplasm of prostate: Secondary | ICD-10-CM | POA: Diagnosis not present

## 2012-03-09 DIAGNOSIS — E119 Type 2 diabetes mellitus without complications: Secondary | ICD-10-CM | POA: Diagnosis not present

## 2012-03-09 DIAGNOSIS — E785 Hyperlipidemia, unspecified: Secondary | ICD-10-CM | POA: Diagnosis not present

## 2012-03-09 DIAGNOSIS — M109 Gout, unspecified: Secondary | ICD-10-CM | POA: Diagnosis not present

## 2012-03-09 DIAGNOSIS — K219 Gastro-esophageal reflux disease without esophagitis: Secondary | ICD-10-CM | POA: Diagnosis not present

## 2012-03-09 DIAGNOSIS — I10 Essential (primary) hypertension: Secondary | ICD-10-CM | POA: Diagnosis not present

## 2012-03-10 ENCOUNTER — Ambulatory Visit
Admission: RE | Admit: 2012-03-10 | Discharge: 2012-03-10 | Disposition: A | Discharge status: Home / Self Care | Payer: Medicare Other | Source: Ambulatory Visit | Attending: Radiation Oncology | Admitting: Radiation Oncology

## 2012-03-10 ENCOUNTER — Ambulatory Visit: Payer: Medicare Other

## 2012-03-10 DIAGNOSIS — K219 Gastro-esophageal reflux disease without esophagitis: Secondary | ICD-10-CM | POA: Diagnosis not present

## 2012-03-10 DIAGNOSIS — E785 Hyperlipidemia, unspecified: Secondary | ICD-10-CM | POA: Diagnosis not present

## 2012-03-10 DIAGNOSIS — I10 Essential (primary) hypertension: Secondary | ICD-10-CM | POA: Diagnosis not present

## 2012-03-10 DIAGNOSIS — C61 Malignant neoplasm of prostate: Secondary | ICD-10-CM | POA: Diagnosis not present

## 2012-03-10 DIAGNOSIS — M109 Gout, unspecified: Secondary | ICD-10-CM | POA: Diagnosis not present

## 2012-03-10 DIAGNOSIS — E119 Type 2 diabetes mellitus without complications: Secondary | ICD-10-CM | POA: Diagnosis not present

## 2012-03-11 ENCOUNTER — Ambulatory Visit
Admission: RE | Admit: 2012-03-11 | Discharge: 2012-03-11 | Disposition: A | Discharge status: Home / Self Care | Payer: Medicare Other | Source: Ambulatory Visit | Attending: Radiation Oncology | Admitting: Radiation Oncology

## 2012-03-11 ENCOUNTER — Ambulatory Visit: Payer: Medicare Other

## 2012-03-11 DIAGNOSIS — K219 Gastro-esophageal reflux disease without esophagitis: Secondary | ICD-10-CM | POA: Diagnosis not present

## 2012-03-11 DIAGNOSIS — M109 Gout, unspecified: Secondary | ICD-10-CM | POA: Diagnosis not present

## 2012-03-11 DIAGNOSIS — E119 Type 2 diabetes mellitus without complications: Secondary | ICD-10-CM | POA: Diagnosis not present

## 2012-03-11 DIAGNOSIS — E785 Hyperlipidemia, unspecified: Secondary | ICD-10-CM | POA: Diagnosis not present

## 2012-03-11 DIAGNOSIS — I10 Essential (primary) hypertension: Secondary | ICD-10-CM | POA: Diagnosis not present

## 2012-03-11 DIAGNOSIS — C61 Malignant neoplasm of prostate: Secondary | ICD-10-CM | POA: Diagnosis not present

## 2012-03-12 ENCOUNTER — Ambulatory Visit: Payer: Medicare Other

## 2012-03-12 ENCOUNTER — Ambulatory Visit
Admission: RE | Admit: 2012-03-12 | Discharge: 2012-03-12 | Disposition: A | Discharge status: Home / Self Care | Payer: Medicare Other | Source: Ambulatory Visit | Attending: Radiation Oncology | Admitting: Radiation Oncology

## 2012-03-12 ENCOUNTER — Encounter: Payer: Self-pay | Admitting: Radiation Oncology

## 2012-03-12 VITALS — BP 158/89 | HR 74 | Temp 97.8°F | Resp 20 | Wt 239.6 lb

## 2012-03-12 DIAGNOSIS — C61 Malignant neoplasm of prostate: Secondary | ICD-10-CM | POA: Diagnosis not present

## 2012-03-12 DIAGNOSIS — I10 Essential (primary) hypertension: Secondary | ICD-10-CM | POA: Diagnosis not present

## 2012-03-12 DIAGNOSIS — K219 Gastro-esophageal reflux disease without esophagitis: Secondary | ICD-10-CM | POA: Diagnosis not present

## 2012-03-12 DIAGNOSIS — E785 Hyperlipidemia, unspecified: Secondary | ICD-10-CM | POA: Diagnosis not present

## 2012-03-12 DIAGNOSIS — E119 Type 2 diabetes mellitus without complications: Secondary | ICD-10-CM | POA: Diagnosis not present

## 2012-03-12 DIAGNOSIS — M109 Gout, unspecified: Secondary | ICD-10-CM | POA: Diagnosis not present

## 2012-03-12 NOTE — Progress Notes (Signed)
Pt reports increase in urinary frequency, nocturia x 6, urgency. He states he voids more frequently as it gets close to morning. He states he stopped taking Flomax 1 week ago due to "dry mouth". States he "stops drinking liquids at 6:30 pm." Pt denies other urinary or bowel issues, fatigue, loss of appetite.

## 2012-03-12 NOTE — Progress Notes (Signed)
  Radiation Oncology         [id number]) [phone] ________________________________  Name: Eric Rivers MRN: [id number]  Date: 03/12/2012  DOB: 1931/01/13  Weekly Radiation Therapy Management  Current Dose: 13.65 Gy     Planned Dose:  78 Gy  Narrative . . . . . . . . The patient presents for routine under treatment assessment.                                           The patient is without complaint.  Pt reports increase in urinary frequency, nocturia x 6, urgency. He states he voids more frequently as it gets close to morning. He states he stopped taking Flomax 1 week ago due to "dry mouth". States he "stops drinking liquids at 6:30 pm." Pt denies other urinary or bowel issues, fatigue, loss of appetite                                 Set-up films were reviewed.                                 The chart was checked. Physical Findings. . .  weight is 239 lb 9.6 oz (108.682 kg). His oral temperature is 97.8 F (36.6 C). His blood pressure is 158/89 and his pulse is 74. His respiration is 20. . Weight essentially stable.  No significant changes. Impression . . . . . . . The patient is  tolerating radiation. Plan . . . . . . . . . . . . Continue treatment as planned.  I recommended he consider resuming Flomax if symptoms persist.  ________________________________  Artist Pais. Kathrynn Running, M.D.

## 2012-03-15 ENCOUNTER — Ambulatory Visit
Admission: RE | Admit: 2012-03-15 | Discharge: 2012-03-15 | Disposition: A | Discharge status: Home / Self Care | Payer: Medicare Other | Source: Ambulatory Visit | Attending: Radiation Oncology | Admitting: Radiation Oncology

## 2012-03-15 ENCOUNTER — Ambulatory Visit: Payer: Medicare Other

## 2012-03-15 DIAGNOSIS — E785 Hyperlipidemia, unspecified: Secondary | ICD-10-CM | POA: Diagnosis not present

## 2012-03-15 DIAGNOSIS — K219 Gastro-esophageal reflux disease without esophagitis: Secondary | ICD-10-CM | POA: Diagnosis not present

## 2012-03-15 DIAGNOSIS — C61 Malignant neoplasm of prostate: Secondary | ICD-10-CM | POA: Diagnosis not present

## 2012-03-15 DIAGNOSIS — M109 Gout, unspecified: Secondary | ICD-10-CM | POA: Diagnosis not present

## 2012-03-15 DIAGNOSIS — I10 Essential (primary) hypertension: Secondary | ICD-10-CM | POA: Diagnosis not present

## 2012-03-15 DIAGNOSIS — E119 Type 2 diabetes mellitus without complications: Secondary | ICD-10-CM | POA: Diagnosis not present

## 2012-03-16 ENCOUNTER — Ambulatory Visit: Payer: Medicare Other

## 2012-03-16 ENCOUNTER — Ambulatory Visit
Admission: RE | Admit: 2012-03-16 | Discharge: 2012-03-16 | Disposition: A | Discharge status: Home / Self Care | Payer: Medicare Other | Source: Ambulatory Visit | Attending: Radiation Oncology | Admitting: Radiation Oncology

## 2012-03-16 DIAGNOSIS — E119 Type 2 diabetes mellitus without complications: Secondary | ICD-10-CM | POA: Diagnosis not present

## 2012-03-16 DIAGNOSIS — E785 Hyperlipidemia, unspecified: Secondary | ICD-10-CM | POA: Diagnosis not present

## 2012-03-16 DIAGNOSIS — I10 Essential (primary) hypertension: Secondary | ICD-10-CM | POA: Diagnosis not present

## 2012-03-16 DIAGNOSIS — M109 Gout, unspecified: Secondary | ICD-10-CM | POA: Diagnosis not present

## 2012-03-16 DIAGNOSIS — C61 Malignant neoplasm of prostate: Secondary | ICD-10-CM | POA: Diagnosis not present

## 2012-03-16 DIAGNOSIS — K219 Gastro-esophageal reflux disease without esophagitis: Secondary | ICD-10-CM | POA: Diagnosis not present

## 2012-03-17 ENCOUNTER — Ambulatory Visit
Admission: RE | Admit: 2012-03-17 | Discharge: 2012-03-17 | Disposition: A | Discharge status: Home / Self Care | Payer: Medicare Other | Source: Ambulatory Visit | Attending: Radiation Oncology | Admitting: Radiation Oncology

## 2012-03-17 ENCOUNTER — Ambulatory Visit: Payer: Medicare Other

## 2012-03-17 DIAGNOSIS — K219 Gastro-esophageal reflux disease without esophagitis: Secondary | ICD-10-CM | POA: Diagnosis not present

## 2012-03-17 DIAGNOSIS — E119 Type 2 diabetes mellitus without complications: Secondary | ICD-10-CM | POA: Diagnosis not present

## 2012-03-17 DIAGNOSIS — C61 Malignant neoplasm of prostate: Secondary | ICD-10-CM | POA: Diagnosis not present

## 2012-03-17 DIAGNOSIS — M109 Gout, unspecified: Secondary | ICD-10-CM | POA: Diagnosis not present

## 2012-03-17 DIAGNOSIS — I10 Essential (primary) hypertension: Secondary | ICD-10-CM | POA: Diagnosis not present

## 2012-03-17 DIAGNOSIS — E785 Hyperlipidemia, unspecified: Secondary | ICD-10-CM | POA: Diagnosis not present

## 2012-03-18 ENCOUNTER — Ambulatory Visit
Admission: RE | Admit: 2012-03-18 | Discharge: 2012-03-18 | Disposition: A | Discharge status: Home / Self Care | Payer: Medicare Other | Source: Ambulatory Visit | Attending: Radiation Oncology | Admitting: Radiation Oncology

## 2012-03-18 ENCOUNTER — Ambulatory Visit: Payer: Medicare Other

## 2012-03-18 DIAGNOSIS — I10 Essential (primary) hypertension: Secondary | ICD-10-CM | POA: Diagnosis not present

## 2012-03-18 DIAGNOSIS — M109 Gout, unspecified: Secondary | ICD-10-CM | POA: Diagnosis not present

## 2012-03-18 DIAGNOSIS — E119 Type 2 diabetes mellitus without complications: Secondary | ICD-10-CM | POA: Diagnosis not present

## 2012-03-18 DIAGNOSIS — K219 Gastro-esophageal reflux disease without esophagitis: Secondary | ICD-10-CM | POA: Diagnosis not present

## 2012-03-18 DIAGNOSIS — E785 Hyperlipidemia, unspecified: Secondary | ICD-10-CM | POA: Diagnosis not present

## 2012-03-18 DIAGNOSIS — C61 Malignant neoplasm of prostate: Secondary | ICD-10-CM | POA: Diagnosis not present

## 2012-03-19 ENCOUNTER — Ambulatory Visit: Payer: Medicare Other

## 2012-03-19 ENCOUNTER — Encounter: Payer: Self-pay | Admitting: Radiation Oncology

## 2012-03-19 ENCOUNTER — Ambulatory Visit
Admission: RE | Admit: 2012-03-19 | Discharge: 2012-03-19 | Disposition: A | Discharge status: Home / Self Care | Payer: Medicare Other | Source: Ambulatory Visit | Attending: Radiation Oncology | Admitting: Radiation Oncology

## 2012-03-19 DIAGNOSIS — E785 Hyperlipidemia, unspecified: Secondary | ICD-10-CM | POA: Diagnosis not present

## 2012-03-19 DIAGNOSIS — M109 Gout, unspecified: Secondary | ICD-10-CM | POA: Diagnosis not present

## 2012-03-19 DIAGNOSIS — I10 Essential (primary) hypertension: Secondary | ICD-10-CM | POA: Diagnosis not present

## 2012-03-19 DIAGNOSIS — C61 Malignant neoplasm of prostate: Secondary | ICD-10-CM

## 2012-03-19 DIAGNOSIS — E119 Type 2 diabetes mellitus without complications: Secondary | ICD-10-CM | POA: Diagnosis not present

## 2012-03-19 DIAGNOSIS — K219 Gastro-esophageal reflux disease without esophagitis: Secondary | ICD-10-CM | POA: Diagnosis not present

## 2012-03-19 NOTE — Progress Notes (Signed)
Patient here for weekly rad txs prostate , 12/40 completed, since 3am up to bathroom q 2-3 hours voiding, , back on flomax, when patient takes lasix after rad txs daily, bowels frequent  But normal, no c/o dysuria, but when having bm's has slight stinging,"uncomfortable almost every time" 10:30 AM

## 2012-03-19 NOTE — Progress Notes (Signed)
  Radiation Oncology         [id number]) [phone] ________________________________  Name: Eric Rivers MRN: [id number]  Date: 03/19/2012  DOB: 09/03/1930  Weekly Radiation Therapy Management  Current Dose: 23.4 Gy     Planned Dose:  78 Gy  Narrative . . . . . . . . The patient presents for routine under treatment assessment.                                   Patient here for weekly rad txs prostate , 12/40 completed, since 3am up to bathroom q 2-3 hours voiding, , back on flomax, when patient takes lasix after rad txs daily, bowels frequent But normal, no c/o dysuria, but when having bm's has slight stinging,"uncomfortable almost every time"                                 Set-up films were reviewed.                                 The chart was checked. Physical Findings. . .  vitals were not taken for this visit.. Weight essentially stable.  No significant changes. Impression . . . . . . . The patient is  tolerating radiation. Plan . . . . . . . . . . . . Continue treatment as planned.  ________________________________  Artist Pais. Kathrynn Running, M.D.

## 2012-03-22 ENCOUNTER — Ambulatory Visit
Admission: RE | Admit: 2012-03-22 | Discharge: 2012-03-22 | Disposition: A | Discharge status: Home / Self Care | Payer: Medicare Other | Source: Ambulatory Visit | Attending: Radiation Oncology | Admitting: Radiation Oncology

## 2012-03-22 ENCOUNTER — Ambulatory Visit: Payer: Medicare Other

## 2012-03-22 DIAGNOSIS — C61 Malignant neoplasm of prostate: Secondary | ICD-10-CM | POA: Diagnosis not present

## 2012-03-22 DIAGNOSIS — I10 Essential (primary) hypertension: Secondary | ICD-10-CM | POA: Diagnosis not present

## 2012-03-22 DIAGNOSIS — E119 Type 2 diabetes mellitus without complications: Secondary | ICD-10-CM | POA: Diagnosis not present

## 2012-03-22 DIAGNOSIS — M109 Gout, unspecified: Secondary | ICD-10-CM | POA: Diagnosis not present

## 2012-03-22 DIAGNOSIS — E785 Hyperlipidemia, unspecified: Secondary | ICD-10-CM | POA: Diagnosis not present

## 2012-03-22 DIAGNOSIS — K219 Gastro-esophageal reflux disease without esophagitis: Secondary | ICD-10-CM | POA: Diagnosis not present

## 2012-03-23 ENCOUNTER — Ambulatory Visit: Payer: Medicare Other

## 2012-03-23 ENCOUNTER — Ambulatory Visit
Admission: RE | Admit: 2012-03-23 | Discharge: 2012-03-23 | Disposition: A | Discharge status: Home / Self Care | Payer: Medicare Other | Source: Ambulatory Visit | Attending: Radiation Oncology | Admitting: Radiation Oncology

## 2012-03-23 DIAGNOSIS — E785 Hyperlipidemia, unspecified: Secondary | ICD-10-CM | POA: Diagnosis not present

## 2012-03-23 DIAGNOSIS — M109 Gout, unspecified: Secondary | ICD-10-CM | POA: Diagnosis not present

## 2012-03-23 DIAGNOSIS — E119 Type 2 diabetes mellitus without complications: Secondary | ICD-10-CM | POA: Diagnosis not present

## 2012-03-23 DIAGNOSIS — K219 Gastro-esophageal reflux disease without esophagitis: Secondary | ICD-10-CM | POA: Diagnosis not present

## 2012-03-23 DIAGNOSIS — I10 Essential (primary) hypertension: Secondary | ICD-10-CM | POA: Diagnosis not present

## 2012-03-23 DIAGNOSIS — C61 Malignant neoplasm of prostate: Secondary | ICD-10-CM | POA: Diagnosis not present

## 2012-03-24 ENCOUNTER — Ambulatory Visit
Admission: RE | Admit: 2012-03-24 | Discharge: 2012-03-24 | Disposition: A | Discharge status: Home / Self Care | Payer: Medicare Other | Source: Ambulatory Visit | Attending: Radiation Oncology | Admitting: Radiation Oncology

## 2012-03-24 ENCOUNTER — Ambulatory Visit: Payer: Medicare Other

## 2012-03-24 DIAGNOSIS — M109 Gout, unspecified: Secondary | ICD-10-CM | POA: Diagnosis not present

## 2012-03-24 DIAGNOSIS — E119 Type 2 diabetes mellitus without complications: Secondary | ICD-10-CM | POA: Diagnosis not present

## 2012-03-24 DIAGNOSIS — E785 Hyperlipidemia, unspecified: Secondary | ICD-10-CM | POA: Diagnosis not present

## 2012-03-24 DIAGNOSIS — K219 Gastro-esophageal reflux disease without esophagitis: Secondary | ICD-10-CM | POA: Diagnosis not present

## 2012-03-24 DIAGNOSIS — C61 Malignant neoplasm of prostate: Secondary | ICD-10-CM | POA: Diagnosis not present

## 2012-03-24 DIAGNOSIS — I10 Essential (primary) hypertension: Secondary | ICD-10-CM | POA: Diagnosis not present

## 2012-03-25 ENCOUNTER — Ambulatory Visit: Payer: Medicare Other

## 2012-03-25 ENCOUNTER — Ambulatory Visit
Admission: RE | Admit: 2012-03-25 | Discharge: 2012-03-25 | Disposition: A | Discharge status: Home / Self Care | Payer: Medicare Other | Source: Ambulatory Visit | Attending: Radiation Oncology | Admitting: Radiation Oncology

## 2012-03-25 DIAGNOSIS — M109 Gout, unspecified: Secondary | ICD-10-CM | POA: Diagnosis not present

## 2012-03-25 DIAGNOSIS — K219 Gastro-esophageal reflux disease without esophagitis: Secondary | ICD-10-CM | POA: Diagnosis not present

## 2012-03-25 DIAGNOSIS — C61 Malignant neoplasm of prostate: Secondary | ICD-10-CM | POA: Diagnosis not present

## 2012-03-25 DIAGNOSIS — E119 Type 2 diabetes mellitus without complications: Secondary | ICD-10-CM | POA: Diagnosis not present

## 2012-03-25 DIAGNOSIS — E785 Hyperlipidemia, unspecified: Secondary | ICD-10-CM | POA: Diagnosis not present

## 2012-03-25 DIAGNOSIS — I10 Essential (primary) hypertension: Secondary | ICD-10-CM | POA: Diagnosis not present

## 2012-03-26 ENCOUNTER — Ambulatory Visit
Admission: RE | Admit: 2012-03-26 | Discharge: 2012-03-26 | Disposition: A | Discharge status: Home / Self Care | Payer: Medicare Other | Source: Ambulatory Visit | Attending: Radiation Oncology | Admitting: Radiation Oncology

## 2012-03-26 ENCOUNTER — Encounter: Payer: Self-pay | Admitting: Radiation Oncology

## 2012-03-26 ENCOUNTER — Ambulatory Visit: Payer: Medicare Other

## 2012-03-26 ENCOUNTER — Encounter: Payer: Medicare Other | Admitting: Radiation Oncology

## 2012-03-26 VITALS — BP 165/98 | HR 65 | Temp 97.3°F | Resp 20 | Wt 240.2 lb

## 2012-03-26 DIAGNOSIS — E785 Hyperlipidemia, unspecified: Secondary | ICD-10-CM | POA: Diagnosis not present

## 2012-03-26 DIAGNOSIS — C61 Malignant neoplasm of prostate: Secondary | ICD-10-CM | POA: Diagnosis not present

## 2012-03-26 DIAGNOSIS — E119 Type 2 diabetes mellitus without complications: Secondary | ICD-10-CM | POA: Diagnosis not present

## 2012-03-26 DIAGNOSIS — M109 Gout, unspecified: Secondary | ICD-10-CM | POA: Diagnosis not present

## 2012-03-26 DIAGNOSIS — K219 Gastro-esophageal reflux disease without esophagitis: Secondary | ICD-10-CM | POA: Diagnosis not present

## 2012-03-26 DIAGNOSIS — I10 Essential (primary) hypertension: Secondary | ICD-10-CM | POA: Diagnosis not present

## 2012-03-26 NOTE — Progress Notes (Signed)
  Radiation Oncology         [phone]) [phone] ________________________________  Name: Eric Rivers MRN: [id number]  Date: 03/26/2012  DOB: 22-Nov-1930  Weekly Radiation Therapy Management  Current Dose: 33.15 Gy     Planned Dose:  78 Gy  Narrative . . . . . . . . The patient presents for routine under treatment assessment.                                               Patient here weekly rad txs 17/40 completed, alert,oriented x3, no dysuria, still has nocturia 6-8x Starting at 3am, q 1/2 hour ,this has been past 3 weeks same, takes flomax 0.4mg  at night, bowels regular no c/o pain, increased frequency/urgency                                 Set-up films were reviewed.                                 The chart was checked. Physical Findings. . .  weight is 240 lb 3.2 oz (108.954 kg). His oral temperature is 97.3 F (36.3 C). His blood pressure is 165/98 and his pulse is 65. His respiration is 20. . Weight essentially stable.  No significant changes. Impression . . . . . . . The patient is  tolerating radiation. Plan . . . . . . . . . . . . Continue treatment as planned.  ________________________________  Artist Pais. Kathrynn Running, M.D.

## 2012-03-26 NOTE — Progress Notes (Signed)
Patient here weekly rad txs 17/40 completed, alert,oriented x3, no dysuria, still has nocturia 6-8x  Starting at 3am, q 1/2 hour ,this has been past 3 weeks same, takes flomax 0.4mg  at night, bowels regular no c/o pain, increased frequency/urgency 10:16 AM

## 2012-03-29 ENCOUNTER — Ambulatory Visit: Payer: Medicare Other

## 2012-03-29 ENCOUNTER — Ambulatory Visit
Admission: RE | Admit: 2012-03-29 | Discharge: 2012-03-29 | Disposition: A | Discharge status: Home / Self Care | Payer: Medicare Other | Source: Ambulatory Visit | Attending: Radiation Oncology | Admitting: Radiation Oncology

## 2012-03-29 DIAGNOSIS — M109 Gout, unspecified: Secondary | ICD-10-CM | POA: Diagnosis not present

## 2012-03-29 DIAGNOSIS — E119 Type 2 diabetes mellitus without complications: Secondary | ICD-10-CM | POA: Diagnosis not present

## 2012-03-29 DIAGNOSIS — I10 Essential (primary) hypertension: Secondary | ICD-10-CM | POA: Diagnosis not present

## 2012-03-29 DIAGNOSIS — C61 Malignant neoplasm of prostate: Secondary | ICD-10-CM | POA: Diagnosis not present

## 2012-03-29 DIAGNOSIS — E785 Hyperlipidemia, unspecified: Secondary | ICD-10-CM | POA: Diagnosis not present

## 2012-03-29 DIAGNOSIS — K219 Gastro-esophageal reflux disease without esophagitis: Secondary | ICD-10-CM | POA: Diagnosis not present

## 2012-03-30 ENCOUNTER — Ambulatory Visit
Admission: RE | Admit: 2012-03-30 | Discharge: 2012-03-30 | Disposition: A | Discharge status: Home / Self Care | Payer: Medicare Other | Source: Ambulatory Visit | Attending: Radiation Oncology | Admitting: Radiation Oncology

## 2012-03-30 ENCOUNTER — Ambulatory Visit: Payer: Medicare Other

## 2012-03-30 DIAGNOSIS — I10 Essential (primary) hypertension: Secondary | ICD-10-CM | POA: Diagnosis not present

## 2012-03-30 DIAGNOSIS — K219 Gastro-esophageal reflux disease without esophagitis: Secondary | ICD-10-CM | POA: Diagnosis not present

## 2012-03-30 DIAGNOSIS — E785 Hyperlipidemia, unspecified: Secondary | ICD-10-CM | POA: Diagnosis not present

## 2012-03-30 DIAGNOSIS — E119 Type 2 diabetes mellitus without complications: Secondary | ICD-10-CM | POA: Diagnosis not present

## 2012-03-30 DIAGNOSIS — M109 Gout, unspecified: Secondary | ICD-10-CM | POA: Diagnosis not present

## 2012-03-30 DIAGNOSIS — C61 Malignant neoplasm of prostate: Secondary | ICD-10-CM | POA: Diagnosis not present

## 2012-03-31 ENCOUNTER — Ambulatory Visit (INDEPENDENT_AMBULATORY_CARE_PROVIDER_SITE_OTHER): Payer: Medicare Other | Admitting: General Practice

## 2012-03-31 ENCOUNTER — Ambulatory Visit: Payer: Medicare Other

## 2012-03-31 ENCOUNTER — Ambulatory Visit
Admission: RE | Admit: 2012-03-31 | Discharge: 2012-03-31 | Disposition: A | Discharge status: Home / Self Care | Payer: Medicare Other | Source: Ambulatory Visit | Attending: Radiation Oncology | Admitting: Radiation Oncology

## 2012-03-31 DIAGNOSIS — I4891 Unspecified atrial fibrillation: Secondary | ICD-10-CM

## 2012-03-31 DIAGNOSIS — I10 Essential (primary) hypertension: Secondary | ICD-10-CM | POA: Diagnosis not present

## 2012-03-31 DIAGNOSIS — I359 Nonrheumatic aortic valve disorder, unspecified: Secondary | ICD-10-CM | POA: Diagnosis not present

## 2012-03-31 DIAGNOSIS — M109 Gout, unspecified: Secondary | ICD-10-CM | POA: Diagnosis not present

## 2012-03-31 DIAGNOSIS — E785 Hyperlipidemia, unspecified: Secondary | ICD-10-CM | POA: Diagnosis not present

## 2012-03-31 DIAGNOSIS — C61 Malignant neoplasm of prostate: Secondary | ICD-10-CM | POA: Diagnosis not present

## 2012-03-31 DIAGNOSIS — E119 Type 2 diabetes mellitus without complications: Secondary | ICD-10-CM | POA: Diagnosis not present

## 2012-03-31 DIAGNOSIS — K219 Gastro-esophageal reflux disease without esophagitis: Secondary | ICD-10-CM | POA: Diagnosis not present

## 2012-03-31 LAB — POCT INR: INR: 2.6

## 2012-04-01 ENCOUNTER — Ambulatory Visit: Payer: Medicare Other

## 2012-04-01 ENCOUNTER — Ambulatory Visit
Admission: RE | Admit: 2012-04-01 | Discharge: 2012-04-01 | Disposition: A | Discharge status: Home / Self Care | Payer: Medicare Other | Source: Ambulatory Visit | Attending: Radiation Oncology | Admitting: Radiation Oncology

## 2012-04-01 DIAGNOSIS — C61 Malignant neoplasm of prostate: Secondary | ICD-10-CM | POA: Diagnosis not present

## 2012-04-01 DIAGNOSIS — K219 Gastro-esophageal reflux disease without esophagitis: Secondary | ICD-10-CM | POA: Diagnosis not present

## 2012-04-01 DIAGNOSIS — E785 Hyperlipidemia, unspecified: Secondary | ICD-10-CM | POA: Diagnosis not present

## 2012-04-01 DIAGNOSIS — M109 Gout, unspecified: Secondary | ICD-10-CM | POA: Diagnosis not present

## 2012-04-01 DIAGNOSIS — I10 Essential (primary) hypertension: Secondary | ICD-10-CM | POA: Diagnosis not present

## 2012-04-01 DIAGNOSIS — E119 Type 2 diabetes mellitus without complications: Secondary | ICD-10-CM | POA: Diagnosis not present

## 2012-04-02 ENCOUNTER — Encounter: Payer: Self-pay | Admitting: Radiation Oncology

## 2012-04-02 ENCOUNTER — Ambulatory Visit
Admission: RE | Admit: 2012-04-02 | Discharge: 2012-04-02 | Disposition: A | Discharge status: Home / Self Care | Payer: Medicare Other | Source: Ambulatory Visit | Attending: Radiation Oncology | Admitting: Radiation Oncology

## 2012-04-02 ENCOUNTER — Ambulatory Visit: Payer: Medicare Other

## 2012-04-02 VITALS — Wt 240.0 lb

## 2012-04-02 DIAGNOSIS — E785 Hyperlipidemia, unspecified: Secondary | ICD-10-CM | POA: Diagnosis not present

## 2012-04-02 DIAGNOSIS — C61 Malignant neoplasm of prostate: Secondary | ICD-10-CM

## 2012-04-02 DIAGNOSIS — E119 Type 2 diabetes mellitus without complications: Secondary | ICD-10-CM | POA: Diagnosis not present

## 2012-04-02 DIAGNOSIS — K219 Gastro-esophageal reflux disease without esophagitis: Secondary | ICD-10-CM | POA: Diagnosis not present

## 2012-04-02 DIAGNOSIS — I10 Essential (primary) hypertension: Secondary | ICD-10-CM | POA: Diagnosis not present

## 2012-04-02 DIAGNOSIS — M109 Gout, unspecified: Secondary | ICD-10-CM | POA: Diagnosis not present

## 2012-04-02 NOTE — Progress Notes (Signed)
Patient  Here  Rad txs prostate 22/40 completed, alert,oriented x3, still 6-7x nocturia, bowel movements  Formed frequent as well, increased frequency /urgency voiding, on flomax daily, no c/o pain ,

## 2012-04-02 NOTE — Progress Notes (Signed)
  Radiation Oncology         [phone]) [phone] ________________________________  Name: Eric CHAPPLE MRN: [id number]  Date: 04/02/2012  DOB: 04/20/1930  Weekly Radiation Therapy Management  Current Dose: 42.9 Gy     Planned Dose:  78 Gy  Narrative . . . . . . . . The patient presents for routine under treatment assessment.                                           22/40 completed, alert,oriented x3, still 6-7x nocturia, bowel movements Formed frequent as well, increased frequency /urgency voiding, on flomax daily, no c/o pain                                  Set-up films were reviewed.                                 The chart was checked. Physical Findings. . .  weight is 240 lb (108.863 kg). . Weight essentially stable.  No significant changes. Impression . . . . . . . The patient is  tolerating radiation. Plan . . . . . . . . . . . . Continue treatment as planned.  ________________________________  Artist Pais. Kathrynn Running, M.D.

## 2012-04-03 HISTORY — PX: EXPLORATION POST OPERATIVE OPEN HEART: SHX5061

## 2012-04-05 ENCOUNTER — Ambulatory Visit
Admission: RE | Admit: 2012-04-05 | Discharge: 2012-04-05 | Disposition: A | Discharge status: Home / Self Care | Payer: Medicare Other | Source: Ambulatory Visit | Attending: Radiation Oncology | Admitting: Radiation Oncology

## 2012-04-05 ENCOUNTER — Ambulatory Visit: Payer: Medicare Other

## 2012-04-05 DIAGNOSIS — C61 Malignant neoplasm of prostate: Secondary | ICD-10-CM | POA: Diagnosis not present

## 2012-04-05 DIAGNOSIS — M109 Gout, unspecified: Secondary | ICD-10-CM | POA: Diagnosis not present

## 2012-04-05 DIAGNOSIS — E785 Hyperlipidemia, unspecified: Secondary | ICD-10-CM | POA: Diagnosis not present

## 2012-04-05 DIAGNOSIS — E119 Type 2 diabetes mellitus without complications: Secondary | ICD-10-CM | POA: Diagnosis not present

## 2012-04-05 DIAGNOSIS — K219 Gastro-esophageal reflux disease without esophagitis: Secondary | ICD-10-CM | POA: Diagnosis not present

## 2012-04-05 DIAGNOSIS — I10 Essential (primary) hypertension: Secondary | ICD-10-CM | POA: Diagnosis not present

## 2012-04-06 ENCOUNTER — Ambulatory Visit: Payer: Medicare Other

## 2012-04-06 ENCOUNTER — Ambulatory Visit
Admission: RE | Admit: 2012-04-06 | Discharge: 2012-04-06 | Disposition: A | Discharge status: Home / Self Care | Payer: Medicare Other | Source: Ambulatory Visit | Attending: Radiation Oncology | Admitting: Radiation Oncology

## 2012-04-06 DIAGNOSIS — M109 Gout, unspecified: Secondary | ICD-10-CM | POA: Diagnosis not present

## 2012-04-06 DIAGNOSIS — E119 Type 2 diabetes mellitus without complications: Secondary | ICD-10-CM | POA: Diagnosis not present

## 2012-04-06 DIAGNOSIS — K219 Gastro-esophageal reflux disease without esophagitis: Secondary | ICD-10-CM | POA: Diagnosis not present

## 2012-04-06 DIAGNOSIS — I10 Essential (primary) hypertension: Secondary | ICD-10-CM | POA: Diagnosis not present

## 2012-04-06 DIAGNOSIS — C61 Malignant neoplasm of prostate: Secondary | ICD-10-CM | POA: Diagnosis not present

## 2012-04-06 DIAGNOSIS — E785 Hyperlipidemia, unspecified: Secondary | ICD-10-CM | POA: Diagnosis not present

## 2012-04-07 ENCOUNTER — Ambulatory Visit: Payer: Medicare Other

## 2012-04-07 ENCOUNTER — Ambulatory Visit
Admission: RE | Admit: 2012-04-07 | Discharge: 2012-04-07 | Disposition: A | Discharge status: Home / Self Care | Payer: Medicare Other | Source: Ambulatory Visit | Attending: Radiation Oncology | Admitting: Radiation Oncology

## 2012-04-07 DIAGNOSIS — E785 Hyperlipidemia, unspecified: Secondary | ICD-10-CM | POA: Diagnosis not present

## 2012-04-07 DIAGNOSIS — K219 Gastro-esophageal reflux disease without esophagitis: Secondary | ICD-10-CM | POA: Diagnosis not present

## 2012-04-07 DIAGNOSIS — M109 Gout, unspecified: Secondary | ICD-10-CM | POA: Diagnosis not present

## 2012-04-07 DIAGNOSIS — I10 Essential (primary) hypertension: Secondary | ICD-10-CM | POA: Diagnosis not present

## 2012-04-07 DIAGNOSIS — C61 Malignant neoplasm of prostate: Secondary | ICD-10-CM | POA: Diagnosis not present

## 2012-04-07 DIAGNOSIS — E119 Type 2 diabetes mellitus without complications: Secondary | ICD-10-CM | POA: Diagnosis not present

## 2012-04-08 ENCOUNTER — Ambulatory Visit
Admission: RE | Admit: 2012-04-08 | Discharge: 2012-04-08 | Disposition: A | Discharge status: Home / Self Care | Payer: Medicare Other | Source: Ambulatory Visit | Attending: Radiation Oncology | Admitting: Radiation Oncology

## 2012-04-08 ENCOUNTER — Ambulatory Visit: Payer: Medicare Other

## 2012-04-08 DIAGNOSIS — E119 Type 2 diabetes mellitus without complications: Secondary | ICD-10-CM | POA: Diagnosis not present

## 2012-04-08 DIAGNOSIS — I10 Essential (primary) hypertension: Secondary | ICD-10-CM | POA: Diagnosis not present

## 2012-04-08 DIAGNOSIS — M109 Gout, unspecified: Secondary | ICD-10-CM | POA: Diagnosis not present

## 2012-04-08 DIAGNOSIS — C61 Malignant neoplasm of prostate: Secondary | ICD-10-CM | POA: Diagnosis not present

## 2012-04-08 DIAGNOSIS — E785 Hyperlipidemia, unspecified: Secondary | ICD-10-CM | POA: Diagnosis not present

## 2012-04-08 DIAGNOSIS — K219 Gastro-esophageal reflux disease without esophagitis: Secondary | ICD-10-CM | POA: Diagnosis not present

## 2012-04-09 ENCOUNTER — Ambulatory Visit
Admission: RE | Admit: 2012-04-09 | Discharge: 2012-04-09 | Disposition: A | Discharge status: Home / Self Care | Payer: Medicare Other | Source: Ambulatory Visit | Attending: Radiation Oncology | Admitting: Radiation Oncology

## 2012-04-09 ENCOUNTER — Encounter: Payer: Self-pay | Admitting: Radiation Oncology

## 2012-04-09 ENCOUNTER — Ambulatory Visit: Payer: Medicare Other

## 2012-04-09 VITALS — BP 147/90 | HR 65 | Temp 97.9°F | Resp 20 | Wt 237.7 lb

## 2012-04-09 DIAGNOSIS — M109 Gout, unspecified: Secondary | ICD-10-CM | POA: Diagnosis not present

## 2012-04-09 DIAGNOSIS — C61 Malignant neoplasm of prostate: Secondary | ICD-10-CM

## 2012-04-09 DIAGNOSIS — K219 Gastro-esophageal reflux disease without esophagitis: Secondary | ICD-10-CM | POA: Diagnosis not present

## 2012-04-09 DIAGNOSIS — E785 Hyperlipidemia, unspecified: Secondary | ICD-10-CM | POA: Diagnosis not present

## 2012-04-09 DIAGNOSIS — I10 Essential (primary) hypertension: Secondary | ICD-10-CM | POA: Diagnosis not present

## 2012-04-09 DIAGNOSIS — E119 Type 2 diabetes mellitus without complications: Secondary | ICD-10-CM | POA: Diagnosis not present

## 2012-04-09 NOTE — Addendum Note (Signed)
Encounter addended by: Oneita Hurt, MD on: 04/09/2012 11:50 AM<BR>     Documentation filed: Notes Section

## 2012-04-09 NOTE — Progress Notes (Signed)
Patient here put prostate ca, 27/40 completed, alert,oriented x3, no dysuria, does have frequency bowel movements 4x day and middle of night, takes flomax at night, went to bed at 9pm,  Then up at 11pm every hour 2-3x all night voiding, eating and drinking well 10:32 AM

## 2012-04-09 NOTE — Progress Notes (Signed)
  Radiation Oncology         [id number]) [phone] ________________________________  Name: Eric Rivers MRN: [id number]  Date: 04/09/2012  DOB: 1930-12-29  Weekly Radiation Therapy Management  Current Dose: 52.65 Gy     Planned Dose:  78 Gy  Narrative . . . . . . . . The patient presents for routine under treatment assessment.                                                                    Patient here put prostate ca, 27/40 completed, alert,oriented x3, no dysuria, does have frequency bowel movements 4x day and middle of night, takes flomax at night, went to bed at 9pm, Then up at 11pm every hour 2-3x all night voiding, eating and drinking well                                 Set-up films were reviewed.                                 The chart was checked. Physical Findings. . .  weight is 237 lb 11.2 oz (107.82 kg). His oral temperature is 97.9 F (36.6 C). His blood pressure is 147/90 and his pulse is 65. His respiration is 20. . Weight essentially stable.  No significant changes. Impression . . . . . . . The patient is  tolerating radiation. Plan . . . . . . . . . . . . Continue treatment as planned.  ________________________________  Artist Pais. Kathrynn Running, M.D.

## 2012-04-12 ENCOUNTER — Ambulatory Visit: Payer: Medicare Other

## 2012-04-12 ENCOUNTER — Ambulatory Visit
Admission: RE | Admit: 2012-04-12 | Discharge: 2012-04-12 | Disposition: A | Discharge status: Home / Self Care | Payer: Medicare Other | Source: Ambulatory Visit | Attending: Radiation Oncology | Admitting: Radiation Oncology

## 2012-04-12 DIAGNOSIS — C61 Malignant neoplasm of prostate: Secondary | ICD-10-CM | POA: Diagnosis not present

## 2012-04-13 ENCOUNTER — Ambulatory Visit: Payer: Medicare Other

## 2012-04-13 ENCOUNTER — Ambulatory Visit
Admission: RE | Admit: 2012-04-13 | Discharge: 2012-04-13 | Disposition: A | Discharge status: Home / Self Care | Payer: Medicare Other | Source: Ambulatory Visit | Attending: Radiation Oncology | Admitting: Radiation Oncology

## 2012-04-13 DIAGNOSIS — C61 Malignant neoplasm of prostate: Secondary | ICD-10-CM | POA: Diagnosis not present

## 2012-04-14 ENCOUNTER — Ambulatory Visit: Payer: Medicare Other

## 2012-04-14 ENCOUNTER — Ambulatory Visit
Admission: RE | Admit: 2012-04-14 | Discharge: 2012-04-14 | Disposition: A | Discharge status: Home / Self Care | Payer: Medicare Other | Source: Ambulatory Visit | Attending: Radiation Oncology | Admitting: Radiation Oncology

## 2012-04-14 DIAGNOSIS — C61 Malignant neoplasm of prostate: Secondary | ICD-10-CM | POA: Diagnosis not present

## 2012-04-15 ENCOUNTER — Emergency Department (HOSPITAL_COMMUNITY): Payer: Medicare Other

## 2012-04-15 ENCOUNTER — Encounter (HOSPITAL_COMMUNITY): Payer: Self-pay | Admitting: Emergency Medicine

## 2012-04-15 ENCOUNTER — Inpatient Hospital Stay (HOSPITAL_COMMUNITY)
Admission: EM | Admit: 2012-04-15 | Discharge: 2012-04-21 | DRG: 220 | Disposition: A | Discharge status: Skilled Nursing Facility | Payer: Medicare Other | Attending: Surgery | Admitting: Surgery

## 2012-04-15 ENCOUNTER — Encounter (HOSPITAL_COMMUNITY): Payer: Self-pay | Admitting: Anesthesiology

## 2012-04-15 ENCOUNTER — Inpatient Hospital Stay (HOSPITAL_COMMUNITY): Payer: Medicare Other | Admitting: Anesthesiology

## 2012-04-15 ENCOUNTER — Ambulatory Visit: Payer: Medicare Other

## 2012-04-15 ENCOUNTER — Inpatient Hospital Stay (HOSPITAL_COMMUNITY): Payer: Medicare Other

## 2012-04-15 ENCOUNTER — Other Ambulatory Visit: Payer: Self-pay

## 2012-04-15 ENCOUNTER — Encounter (HOSPITAL_COMMUNITY)
Admission: EM | Disposition: A | Discharge status: Skilled Nursing Facility | Payer: Self-pay | Source: Home / Self Care | Attending: Surgery

## 2012-04-15 DIAGNOSIS — Z7901 Long term (current) use of anticoagulants: Secondary | ICD-10-CM | POA: Diagnosis not present

## 2012-04-15 DIAGNOSIS — I7100 Dissection of unspecified site of aorta: Secondary | ICD-10-CM | POA: Diagnosis not present

## 2012-04-15 DIAGNOSIS — M109 Gout, unspecified: Secondary | ICD-10-CM | POA: Diagnosis present

## 2012-04-15 DIAGNOSIS — D696 Thrombocytopenia, unspecified: Secondary | ICD-10-CM | POA: Diagnosis present

## 2012-04-15 DIAGNOSIS — I7101 Dissection of thoracic aorta: Principal | ICD-10-CM | POA: Diagnosis present

## 2012-04-15 DIAGNOSIS — R079 Chest pain, unspecified: Secondary | ICD-10-CM | POA: Diagnosis not present

## 2012-04-15 DIAGNOSIS — N39498 Other specified urinary incontinence: Secondary | ICD-10-CM | POA: Diagnosis present

## 2012-04-15 DIAGNOSIS — Z9889 Other specified postprocedural states: Secondary | ICD-10-CM | POA: Diagnosis not present

## 2012-04-15 DIAGNOSIS — M5137 Other intervertebral disc degeneration, lumbosacral region: Secondary | ICD-10-CM | POA: Diagnosis present

## 2012-04-15 DIAGNOSIS — Z8042 Family history of malignant neoplasm of prostate: Secondary | ICD-10-CM | POA: Diagnosis not present

## 2012-04-15 DIAGNOSIS — R5381 Other malaise: Secondary | ICD-10-CM | POA: Diagnosis present

## 2012-04-15 DIAGNOSIS — K219 Gastro-esophageal reflux disease without esophagitis: Secondary | ICD-10-CM | POA: Diagnosis not present

## 2012-04-15 DIAGNOSIS — I4891 Unspecified atrial fibrillation: Secondary | ICD-10-CM | POA: Diagnosis present

## 2012-04-15 DIAGNOSIS — I10 Essential (primary) hypertension: Secondary | ICD-10-CM | POA: Diagnosis present

## 2012-04-15 DIAGNOSIS — I359 Nonrheumatic aortic valve disorder, unspecified: Secondary | ICD-10-CM | POA: Diagnosis present

## 2012-04-15 DIAGNOSIS — I71019 Dissection of thoracic aorta, unspecified: Principal | ICD-10-CM | POA: Diagnosis present

## 2012-04-15 DIAGNOSIS — N289 Disorder of kidney and ureter, unspecified: Secondary | ICD-10-CM | POA: Diagnosis not present

## 2012-04-15 DIAGNOSIS — D62 Acute posthemorrhagic anemia: Secondary | ICD-10-CM | POA: Diagnosis not present

## 2012-04-15 DIAGNOSIS — R0989 Other specified symptoms and signs involving the circulatory and respiratory systems: Secondary | ICD-10-CM | POA: Diagnosis not present

## 2012-04-15 DIAGNOSIS — J90 Pleural effusion, not elsewhere classified: Secondary | ICD-10-CM | POA: Diagnosis not present

## 2012-04-15 DIAGNOSIS — I119 Hypertensive heart disease without heart failure: Secondary | ICD-10-CM | POA: Diagnosis not present

## 2012-04-15 DIAGNOSIS — C61 Malignant neoplasm of prostate: Secondary | ICD-10-CM | POA: Diagnosis present

## 2012-04-15 DIAGNOSIS — I482 Chronic atrial fibrillation, unspecified: Secondary | ICD-10-CM

## 2012-04-15 DIAGNOSIS — R0602 Shortness of breath: Secondary | ICD-10-CM

## 2012-04-15 DIAGNOSIS — Z87891 Personal history of nicotine dependence: Secondary | ICD-10-CM

## 2012-04-15 DIAGNOSIS — E785 Hyperlipidemia, unspecified: Secondary | ICD-10-CM | POA: Diagnosis present

## 2012-04-15 DIAGNOSIS — E119 Type 2 diabetes mellitus without complications: Secondary | ICD-10-CM | POA: Diagnosis present

## 2012-04-15 DIAGNOSIS — I319 Disease of pericardium, unspecified: Secondary | ICD-10-CM | POA: Diagnosis present

## 2012-04-15 DIAGNOSIS — C679 Malignant neoplasm of bladder, unspecified: Secondary | ICD-10-CM | POA: Diagnosis not present

## 2012-04-15 DIAGNOSIS — I959 Hypotension, unspecified: Secondary | ICD-10-CM | POA: Diagnosis not present

## 2012-04-15 DIAGNOSIS — J9819 Other pulmonary collapse: Secondary | ICD-10-CM | POA: Diagnosis not present

## 2012-04-15 DIAGNOSIS — E7800 Pure hypercholesterolemia, unspecified: Secondary | ICD-10-CM | POA: Diagnosis not present

## 2012-04-15 DIAGNOSIS — Z4682 Encounter for fitting and adjustment of non-vascular catheter: Secondary | ICD-10-CM | POA: Diagnosis not present

## 2012-04-15 DIAGNOSIS — Z951 Presence of aortocoronary bypass graft: Secondary | ICD-10-CM | POA: Diagnosis not present

## 2012-04-15 DIAGNOSIS — R072 Precordial pain: Secondary | ICD-10-CM | POA: Diagnosis not present

## 2012-04-15 DIAGNOSIS — M51379 Other intervertebral disc degeneration, lumbosacral region without mention of lumbar back pain or lower extremity pain: Secondary | ICD-10-CM | POA: Diagnosis present

## 2012-04-15 DIAGNOSIS — I712 Thoracic aortic aneurysm, without rupture: Secondary | ICD-10-CM | POA: Diagnosis not present

## 2012-04-15 DIAGNOSIS — Z452 Encounter for adjustment and management of vascular access device: Secondary | ICD-10-CM | POA: Diagnosis not present

## 2012-04-15 DIAGNOSIS — R110 Nausea: Secondary | ICD-10-CM | POA: Diagnosis not present

## 2012-04-15 DIAGNOSIS — Z5189 Encounter for other specified aftercare: Secondary | ICD-10-CM | POA: Diagnosis not present

## 2012-04-15 HISTORY — PX: TEE WITHOUT CARDIOVERSION: SHX5443

## 2012-04-15 HISTORY — PX: ASCENDING AORTIC ROOT REPLACEMENT: SHX5729

## 2012-04-15 LAB — CBC
HCT: 26.9 % — ABNORMAL LOW (ref 39.0–52.0)
Hemoglobin: 12.1 g/dL — ABNORMAL LOW (ref 13.0–17.0)
Hemoglobin: 9.1 g/dL — ABNORMAL LOW (ref 13.0–17.0)
MCH: 33.2 pg (ref 26.0–34.0)
MCV: 97.3 fL (ref 78.0–100.0)
Platelets: 132 10*3/uL — ABNORMAL LOW (ref 150–400)
RBC: 3.65 MIL/uL — ABNORMAL LOW (ref 4.22–5.81)
RBC: 2.77 MIL/uL — ABNORMAL LOW (ref 4.22–5.81)
RDW: 14.9 % (ref 11.5–15.5)
WBC: 8.9 10*3/uL (ref 4.0–10.5)
WBC: 14.1 10*3/uL — ABNORMAL HIGH (ref 4.0–10.5)

## 2012-04-15 LAB — POCT I-STAT 3, ART BLOOD GAS (G3+)
Acid-base deficit: 3 mmol/L — ABNORMAL HIGH (ref 0.0–2.0)
Bicarbonate: 25.4 mEq/L — ABNORMAL HIGH (ref 20.0–24.0)
Bicarbonate: 24.3 mEq/L — ABNORMAL HIGH (ref 20.0–24.0)
Bicarbonate: 28.4 mEq/L — ABNORMAL HIGH (ref 20.0–24.0)
Bicarbonate: 23.2 mEq/L (ref 20.0–24.0)
O2 Saturation: 100 %
O2 Saturation: 100 %
TCO2: 27 mmol/L (ref 0–100)
TCO2: 25 mmol/L (ref 0–100)
TCO2: 30 mmol/L (ref 0–100)
pCO2 arterial: 39.9 mmHg (ref 35.0–45.0)
pCO2 arterial: 61.9 mmHg (ref 35.0–45.0)
pCO2 arterial: 44.6 mmHg (ref 35.0–45.0)
pH, Arterial: 7.412 (ref 7.350–7.450)
pH, Arterial: 7.436 (ref 7.350–7.450)
pH, Arterial: 7.336 — ABNORMAL LOW (ref 7.350–7.450)
pO2, Arterial: 299 mmHg — ABNORMAL HIGH (ref 80.0–100.0)
pO2, Arterial: 375 mmHg — ABNORMAL HIGH (ref 80.0–100.0)

## 2012-04-15 LAB — PROTIME-INR
INR: 2.56 — ABNORMAL HIGH (ref 0.00–1.49)
INR: 0.92 (ref 0.00–1.49)
INR: 0.94 (ref 0.00–1.49)
Prothrombin Time: 12.3 seconds (ref 11.6–15.2)

## 2012-04-15 LAB — POCT I-STAT, CHEM 8
BUN: 20 mg/dL (ref 6–23)
Creatinine, Ser: 0.9 mg/dL (ref 0.50–1.35)
Hemoglobin: 13.6 g/dL (ref 13.0–17.0)
Potassium: 3.8 mEq/L (ref 3.5–5.1)
Sodium: 140 mEq/L (ref 135–145)
TCO2: 28 mmol/L (ref 0–100)

## 2012-04-15 LAB — POCT I-STAT 4, (NA,K, GLUC, HGB,HCT)
Glucose, Bld: 108 mg/dL — ABNORMAL HIGH (ref 70–99)
Glucose, Bld: 114 mg/dL — ABNORMAL HIGH (ref 70–99)
Glucose, Bld: 142 mg/dL — ABNORMAL HIGH (ref 70–99)
HCT: 32 % — ABNORMAL LOW (ref 39.0–52.0)
HCT: 26 % — ABNORMAL LOW (ref 39.0–52.0)
Hemoglobin: 10.9 g/dL — ABNORMAL LOW (ref 13.0–17.0)
Hemoglobin: 8.8 g/dL — ABNORMAL LOW (ref 13.0–17.0)
Hemoglobin: 8.2 g/dL — ABNORMAL LOW (ref 13.0–17.0)
Potassium: 4 mEq/L (ref 3.5–5.1)
Potassium: 4 mEq/L (ref 3.5–5.1)
Potassium: 3.5 mEq/L (ref 3.5–5.1)
Sodium: 142 mEq/L (ref 135–145)
Sodium: 140 mEq/L (ref 135–145)
Sodium: 143 mEq/L (ref 135–145)

## 2012-04-15 LAB — FIBRINOGEN: Fibrinogen: 170 mg/dL — ABNORMAL LOW (ref 204–475)

## 2012-04-15 LAB — PRO B NATRIURETIC PEPTIDE: Pro B Natriuretic peptide (BNP): 643 pg/mL — ABNORMAL HIGH (ref 0–450)

## 2012-04-15 LAB — HEMOGLOBIN AND HEMATOCRIT, BLOOD
HCT: 23.3 % — ABNORMAL LOW (ref 39.0–52.0)
Hemoglobin: 8.1 g/dL — ABNORMAL LOW (ref 13.0–17.0)

## 2012-04-15 LAB — MRSA PCR SCREENING: MRSA by PCR: NEGATIVE

## 2012-04-15 LAB — POCT I-STAT TROPONIN I: Troponin i, poc: 0 ng/mL (ref 0.00–0.08)

## 2012-04-15 LAB — ABO/RH: ABO/RH(D): O POS

## 2012-04-15 LAB — POCT I-STAT GLUCOSE: Glucose, Bld: 118 mg/dL — ABNORMAL HIGH (ref 70–99)

## 2012-04-15 LAB — APTT: aPTT: 42 seconds — ABNORMAL HIGH (ref 24–37)

## 2012-04-15 SURGERY — ASCENDING AORTIC ROOT REPLACEMENT
Anesthesia: General | Site: Chest | Wound class: Clean

## 2012-04-15 MED ORDER — PHENYLEPHRINE HCL 10 MG/ML IJ SOLN
20.0000 mg | INTRAVENOUS | Status: DC | PRN
  Administered 2012-04-15: 25 ug/min via INTRAVENOUS

## 2012-04-15 MED ORDER — HEMOSTATIC AGENTS (NO CHARGE) OPTIME
TOPICAL | Status: DC | PRN
  Administered 2012-04-15: 1 via TOPICAL

## 2012-04-15 MED ORDER — BISACODYL 5 MG PO TBEC
10.0000 mg | DELAYED_RELEASE_TABLET | Freq: Every day | ORAL | Status: DC
  Administered 2012-04-18: 10 mg via ORAL
  Filled 2012-04-15 (×2): qty 2

## 2012-04-15 MED ORDER — DOCUSATE SODIUM 100 MG PO CAPS
200.0000 mg | ORAL_CAPSULE | Freq: Every day | ORAL | Status: DC
  Administered 2012-04-18: 200 mg via ORAL
  Filled 2012-04-15 (×2): qty 2

## 2012-04-15 MED ORDER — ACETAMINOPHEN 10 MG/ML IV SOLN
1000.0000 mg | Freq: Once | INTRAVENOUS | Status: AC
  Administered 2012-04-16: 1000 mg via INTRAVENOUS
  Filled 2012-04-15: qty 100

## 2012-04-15 MED ORDER — ARTIFICIAL TEARS OP OINT
TOPICAL_OINTMENT | OPHTHALMIC | Status: DC | PRN
  Administered 2012-04-15: 1 via OPHTHALMIC

## 2012-04-15 MED ORDER — INSULIN REGULAR BOLUS VIA INFUSION
0.0000 [IU] | Freq: Three times a day (TID) | INTRAVENOUS | Status: DC
  Administered 2012-04-16: 1.3 [IU]/h via INTRAVENOUS
  Administered 2012-04-16: 1.9 [IU]/h via INTRAVENOUS
  Administered 2012-04-16: 1.7 [IU]/h via INTRAVENOUS
  Administered 2012-04-16: 0 [IU]/h via INTRAVENOUS
  Administered 2012-04-16: 1.7 [IU]/h via INTRAVENOUS
  Filled 2012-04-15: qty 10

## 2012-04-15 MED ORDER — THIAMINE HCL 100 MG/ML IJ SOLN
Freq: Once | INTRAVENOUS | Status: AC
  Administered 2012-04-15: 12:00:00 via INTRAVENOUS
  Filled 2012-04-15: qty 1000

## 2012-04-15 MED ORDER — POTASSIUM CHLORIDE 2 MEQ/ML IV SOLN
80.0000 meq | INTRAVENOUS | Status: DC
  Filled 2012-04-15: qty 40

## 2012-04-15 MED ORDER — SODIUM CHLORIDE 0.9 % IV SOLN
INTRAVENOUS | Status: AC
  Administered 2012-04-15: 2.5 [IU]/h via INTRAVENOUS
  Filled 2012-04-15: qty 1

## 2012-04-15 MED ORDER — VECURONIUM BROMIDE 10 MG IV SOLR
INTRAVENOUS | Status: DC | PRN
  Administered 2012-04-15 (×4): 5 mg via INTRAVENOUS

## 2012-04-15 MED ORDER — ALLOPURINOL 300 MG PO TABS
300.0000 mg | ORAL_TABLET | Freq: Every day | ORAL | Status: DC
  Administered 2012-04-16 – 2012-04-21 (×6): 300 mg via ORAL
  Filled 2012-04-15 (×6): qty 1

## 2012-04-15 MED ORDER — METHYLPREDNISOLONE SODIUM SUCC 125 MG IJ SOLR
INTRAMUSCULAR | Status: DC | PRN
  Administered 2012-04-15: 125 mg via INTRAVENOUS

## 2012-04-15 MED ORDER — ACETAMINOPHEN 160 MG/5ML PO SOLN
975.0000 mg | Freq: Four times a day (QID) | ORAL | Status: DC
  Filled 2012-04-15: qty 40.6

## 2012-04-15 MED ORDER — SODIUM CHLORIDE 0.9 % IJ SOLN: 3.0000 mL | INTRAMUSCULAR | Status: DC | PRN

## 2012-04-15 MED ORDER — ROCURONIUM BROMIDE 100 MG/10ML IV SOLN
INTRAVENOUS | Status: DC | PRN
  Administered 2012-04-15: 50 mg via INTRAVENOUS

## 2012-04-15 MED ORDER — DEXTROSE 5 % IV SOLN
30.0000 ug/min | INTRAVENOUS | Status: AC
  Administered 2012-04-15: 50 ug/min via INTRAVENOUS
  Filled 2012-04-15: qty 2

## 2012-04-15 MED ORDER — VERAPAMIL HCL 2.5 MG/ML IV SOLN
INTRAVENOUS | Status: AC
  Administered 2012-04-15: 12:00:00
  Filled 2012-04-15: qty 2.5

## 2012-04-15 MED ORDER — LACTATED RINGERS IV SOLN: 500.0000 mL | Freq: Once | INTRAVENOUS | Status: AC | PRN

## 2012-04-15 MED ORDER — SODIUM CHLORIDE 0.9 % IJ SOLN
3.0000 mL | Freq: Two times a day (BID) | INTRAMUSCULAR | Status: DC
  Administered 2012-04-16 – 2012-04-18 (×6): 3 mL via INTRAVENOUS

## 2012-04-15 MED ORDER — PHENYLEPHRINE HCL 10 MG/ML IJ SOLN
0.0000 ug/min | INTRAVENOUS | Status: DC
  Filled 2012-04-15: qty 2

## 2012-04-15 MED ORDER — ONDANSETRON HCL 4 MG/2ML IJ SOLN
4.0000 mg | Freq: Once | INTRAMUSCULAR | Status: AC
  Administered 2012-04-15: 4 mg via INTRAVENOUS
  Filled 2012-04-15: qty 2

## 2012-04-15 MED ORDER — LACTATED RINGERS IV SOLN: INTRAVENOUS | Status: DC

## 2012-04-15 MED ORDER — MORPHINE SULFATE 2 MG/ML IJ SOLN: 1.0000 mg | INTRAMUSCULAR | Status: AC | PRN

## 2012-04-15 MED ORDER — PROPOFOL 10 MG/ML IV BOLUS
INTRAVENOUS | Status: DC | PRN
  Administered 2012-04-15: 40 mg via INTRAVENOUS
  Administered 2012-04-15: 30 mg via INTRAVENOUS
  Administered 2012-04-15: 130 mg via INTRAVENOUS

## 2012-04-15 MED ORDER — 0.9 % SODIUM CHLORIDE (POUR BTL) OPTIME
TOPICAL | Status: DC | PRN
  Administered 2012-04-15: 5000 mL

## 2012-04-15 MED ORDER — DEXMEDETOMIDINE HCL IN NACL 400 MCG/100ML IV SOLN
0.1000 ug/kg/h | INTRAVENOUS | Status: AC
  Administered 2012-04-15: 0.2 ug/kg/h via INTRAVENOUS
  Filled 2012-04-15: qty 100

## 2012-04-15 MED ORDER — ALBUMIN HUMAN 5 % IV SOLN
250.0000 mL | INTRAVENOUS | Status: AC | PRN
  Administered 2012-04-15 – 2012-04-16 (×2): 250 mL via INTRAVENOUS

## 2012-04-15 MED ORDER — OXYCODONE HCL 5 MG PO TABS
5.0000 mg | ORAL_TABLET | ORAL | Status: DC | PRN
  Administered 2012-04-16 – 2012-04-17 (×2): 5 mg via ORAL
  Filled 2012-04-15 (×2): qty 1

## 2012-04-15 MED ORDER — MIDAZOLAM HCL 2 MG/2ML IJ SOLN: 2.0000 mg | INTRAMUSCULAR | Status: DC | PRN

## 2012-04-15 MED ORDER — DOPAMINE-DEXTROSE 3.2-5 MG/ML-% IV SOLN
2.0000 ug/kg/min | INTRAVENOUS | Status: DC
Start: 2012-04-15 — End: 2012-04-15
  Filled 2012-04-15: qty 250

## 2012-04-15 MED ORDER — DEXTROSE 5 % IV SOLN
1.5000 g | Freq: Two times a day (BID) | INTRAVENOUS | Status: DC
  Administered 2012-04-16: 1.5 g via INTRAVENOUS
  Filled 2012-04-15: qty 1.5

## 2012-04-15 MED ORDER — LACTATED RINGERS IV SOLN
INTRAVENOUS | Status: DC | PRN
  Administered 2012-04-15 (×2): via INTRAVENOUS

## 2012-04-15 MED ORDER — PROTAMINE SULFATE 10 MG/ML IV SOLN
INTRAVENOUS | Status: DC | PRN
  Administered 2012-04-15: 10 mg via INTRAVENOUS
  Administered 2012-04-15: 240 mg via INTRAVENOUS

## 2012-04-15 MED ORDER — SODIUM CHLORIDE 0.9 % IV SOLN
INTRAVENOUS | Status: DC | PRN
  Administered 2012-04-15: 16:00:00 via INTRAVENOUS

## 2012-04-15 MED ORDER — ACETAMINOPHEN 500 MG PO TABS
1000.0000 mg | ORAL_TABLET | Freq: Four times a day (QID) | ORAL | Status: DC
  Administered 2012-04-16 – 2012-04-18 (×9): 1000 mg via ORAL
  Administered 2012-04-18: 500 mg via ORAL
  Administered 2012-04-18 – 2012-04-19 (×3): 1000 mg via ORAL
  Filled 2012-04-15 (×16): qty 2

## 2012-04-15 MED ORDER — POTASSIUM CHLORIDE 10 MEQ/50ML IV SOLN
10.0000 meq | INTRAVENOUS | Status: AC
  Administered 2012-04-15 (×3): 10 meq via INTRAVENOUS

## 2012-04-15 MED ORDER — ASPIRIN 81 MG PO CHEW
324.0000 mg | CHEWABLE_TABLET | Freq: Every day | ORAL | Status: DC
  Filled 2012-04-15: qty 4

## 2012-04-15 MED ORDER — NITROGLYCERIN IN D5W 200-5 MCG/ML-% IV SOLN: 0.0000 ug/min | INTRAVENOUS | Status: DC

## 2012-04-15 MED ORDER — TAMSULOSIN HCL 0.4 MG PO CAPS
0.4000 mg | ORAL_CAPSULE | Freq: Every day | ORAL | Status: DC
  Administered 2012-04-16 – 2012-04-20 (×5): 0.4 mg via ORAL
  Filled 2012-04-15 (×7): qty 1

## 2012-04-15 MED ORDER — SODIUM CHLORIDE 0.9 % IV SOLN
INTRAVENOUS | Status: DC
  Filled 2012-04-15: qty 1

## 2012-04-15 MED ORDER — MORPHINE SULFATE 2 MG/ML IJ SOLN
2.0000 mg | INTRAMUSCULAR | Status: DC | PRN
Start: 2012-04-15 — End: 2012-04-15

## 2012-04-15 MED ORDER — COAGULATION FACTOR VIIA RECOMB 1 MG IV SOLR
90.0000 ug/kg | Freq: Once | INTRAVENOUS | Status: AC
  Administered 2012-04-15: 10 mg via INTRAVENOUS
  Filled 2012-04-15: qty 10

## 2012-04-15 MED ORDER — EPINEPHRINE HCL 1 MG/ML IJ SOLN
0.5000 ug/min | INTRAVENOUS | Status: DC
  Filled 2012-04-15: qty 4

## 2012-04-15 MED ORDER — IOHEXOL 350 MG/ML SOLN
100.0000 mL | Freq: Once | INTRAVENOUS | Status: AC | PRN
  Administered 2012-04-15: 100 mL via INTRAVENOUS

## 2012-04-15 MED ORDER — MAGNESIUM SULFATE 50 % IJ SOLN
40.0000 meq | INTRAMUSCULAR | Status: DC
  Filled 2012-04-15: qty 10

## 2012-04-15 MED ORDER — NITROGLYCERIN IN D5W 200-5 MCG/ML-% IV SOLN
2.0000 ug/min | INTRAVENOUS | Status: AC
  Administered 2012-04-15: 16.7 ug/min via INTRAVENOUS
  Filled 2012-04-15: qty 250

## 2012-04-15 MED ORDER — PANTOPRAZOLE SODIUM 40 MG PO TBEC
40.0000 mg | DELAYED_RELEASE_TABLET | Freq: Every day | ORAL | Status: DC
  Administered 2012-04-17 – 2012-04-19 (×3): 40 mg via ORAL
  Filled 2012-04-15 (×3): qty 1

## 2012-04-15 MED ORDER — DOPAMINE-DEXTROSE 3.2-5 MG/ML-% IV SOLN: 2.0000 ug/kg/min | INTRAVENOUS | Status: DC

## 2012-04-15 MED ORDER — HEPARIN SODIUM (PORCINE) 1000 UNIT/ML IJ SOLN
INTRAMUSCULAR | Status: DC | PRN
  Administered 2012-04-15: 28000 [IU] via INTRAVENOUS

## 2012-04-15 MED ORDER — MAGNESIUM SULFATE 40 MG/ML IJ SOLN
4.0000 g | Freq: Once | INTRAMUSCULAR | Status: AC
  Administered 2012-04-15: 4 g via INTRAVENOUS
  Filled 2012-04-15: qty 100

## 2012-04-15 MED ORDER — VANCOMYCIN HCL IN DEXTROSE 1-5 GM/200ML-% IV SOLN
1000.0000 mg | Freq: Once | INTRAVENOUS | Status: AC
  Administered 2012-04-16: 1000 mg via INTRAVENOUS
  Filled 2012-04-15: qty 200

## 2012-04-15 MED ORDER — CELECOXIB 200 MG PO CAPS
200.0000 mg | ORAL_CAPSULE | Freq: Every day | ORAL | Status: DC
  Administered 2012-04-16: 200 mg via ORAL
  Filled 2012-04-15: qty 1

## 2012-04-15 MED ORDER — DOPAMINE-DEXTROSE 3.2-5 MG/ML-% IV SOLN
INTRAVENOUS | Status: DC | PRN
  Administered 2012-04-15: 3 ug/kg/min via INTRAVENOUS

## 2012-04-15 MED ORDER — SODIUM CHLORIDE 0.9 % IV SOLN
INTRAVENOUS | Status: AC
  Administered 2012-04-15: 70 mL/h via INTRAVENOUS
  Filled 2012-04-15: qty 40

## 2012-04-15 MED ORDER — DEXTROSE 5 % IV SOLN
1.5000 g | INTRAVENOUS | Status: AC
  Administered 2012-04-15: .75 g via INTRAVENOUS
  Administered 2012-04-15: 1.5 g via INTRAVENOUS
  Filled 2012-04-15: qty 1.5

## 2012-04-15 MED ORDER — DEXMEDETOMIDINE HCL IN NACL 200 MCG/50ML IV SOLN
0.1000 ug/kg/h | INTRAVENOUS | Status: DC
  Administered 2012-04-15: 0.7 ug/kg/h via INTRAVENOUS
  Filled 2012-04-15: qty 50

## 2012-04-15 MED ORDER — VITAMIN K1 10 MG/ML IJ SOLN
5.0000 mg | Freq: Once | INTRAVENOUS | Status: AC
  Administered 2012-04-15: 5 mg via INTRAVENOUS
  Filled 2012-04-15: qty 0.5

## 2012-04-15 MED ORDER — METOPROLOL TARTRATE 25 MG/10 ML ORAL SUSPENSION
12.5000 mg | Freq: Two times a day (BID) | ORAL | Status: DC
  Filled 2012-04-15 (×9): qty 5

## 2012-04-15 MED ORDER — MORPHINE SULFATE 2 MG/ML IJ SOLN: 2.0000 mg | INTRAMUSCULAR | Status: DC | PRN

## 2012-04-15 MED ORDER — VANCOMYCIN HCL 10 G IV SOLR
1500.0000 mg | INTRAVENOUS | Status: AC
  Administered 2012-04-15: 1500 mg via INTRAVENOUS
  Filled 2012-04-15: qty 1500

## 2012-04-15 MED ORDER — LACTATED RINGERS IV SOLN
INTRAVENOUS | Status: DC | PRN
  Administered 2012-04-15: 13:00:00 via INTRAVENOUS

## 2012-04-15 MED ORDER — SODIUM CHLORIDE 0.9 % IV SOLN: 250.0000 mL | INTRAVENOUS | Status: DC | PRN

## 2012-04-15 MED ORDER — ONDANSETRON HCL 4 MG/2ML IJ SOLN
4.0000 mg | Freq: Four times a day (QID) | INTRAMUSCULAR | Status: DC | PRN
  Administered 2012-04-16: 4 mg via INTRAVENOUS
  Filled 2012-04-15: qty 2

## 2012-04-15 MED ORDER — FAMOTIDINE IN NACL 20-0.9 MG/50ML-% IV SOLN
20.0000 mg | Freq: Two times a day (BID) | INTRAVENOUS | Status: AC
  Administered 2012-04-15: 20 mg via INTRAVENOUS

## 2012-04-15 MED ORDER — SUFENTANIL CITRATE 50 MCG/ML IV SOLN
INTRAVENOUS | Status: DC | PRN
  Administered 2012-04-15: 5 ug via INTRAVENOUS
  Administered 2012-04-15 (×2): 30 ug via INTRAVENOUS
  Administered 2012-04-15: 10 ug via INTRAVENOUS
  Administered 2012-04-15: 40 ug via INTRAVENOUS
  Administered 2012-04-15 (×2): 50 ug via INTRAVENOUS
  Administered 2012-04-15: 10 ug via INTRAVENOUS
  Administered 2012-04-15: 20 ug via INTRAVENOUS
  Administered 2012-04-15: 5 ug via INTRAVENOUS

## 2012-04-15 MED ORDER — ETOMIDATE 2 MG/ML IV SOLN
INTRAVENOUS | Status: DC | PRN
  Administered 2012-04-15: 20 mg via INTRAVENOUS

## 2012-04-15 MED ORDER — SODIUM CHLORIDE 0.9 % IV SOLN: INTRAVENOUS | Status: DC

## 2012-04-15 MED ORDER — SODIUM CHLORIDE 0.9 % IJ SOLN
OROMUCOSAL | Status: DC | PRN
  Administered 2012-04-15 (×3): via TOPICAL

## 2012-04-15 MED ORDER — DEXTROSE 5 % IV SOLN
750.0000 mg | INTRAVENOUS | Status: DC
  Filled 2012-04-15: qty 750

## 2012-04-15 MED ORDER — INSULIN ASPART 100 UNIT/ML ~~LOC~~ SOLN: 0.0000 [IU] | SUBCUTANEOUS | Status: DC

## 2012-04-15 MED ORDER — SODIUM CHLORIDE 0.45 % IV SOLN
INTRAVENOUS | Status: DC
  Administered 2012-04-15: 21:00:00 via INTRAVENOUS

## 2012-04-15 MED ORDER — ATORVASTATIN CALCIUM 20 MG PO TABS
20.0000 mg | ORAL_TABLET | Freq: Every day | ORAL | Status: DC
  Administered 2012-04-16 – 2012-04-21 (×6): 20 mg via ORAL
  Filled 2012-04-15 (×7): qty 1

## 2012-04-15 MED ORDER — BISACODYL 10 MG RE SUPP: 10.0000 mg | Freq: Every day | RECTAL | Status: DC

## 2012-04-15 MED ORDER — METOPROLOL TARTRATE 12.5 MG HALF TABLET
12.5000 mg | ORAL_TABLET | Freq: Two times a day (BID) | ORAL | Status: DC
  Administered 2012-04-16 – 2012-04-19 (×5): 12.5 mg via ORAL
  Filled 2012-04-15 (×9): qty 1

## 2012-04-15 MED ORDER — METOPROLOL TARTRATE 1 MG/ML IV SOLN: 2.5000 mg | INTRAVENOUS | Status: DC | PRN

## 2012-04-15 MED ORDER — LABETALOL HCL 5 MG/ML IV SOLN: 10.0000 mg | INTRAVENOUS | Status: DC | PRN

## 2012-04-15 MED ORDER — ALBUMIN HUMAN 5 % IV SOLN
INTRAVENOUS | Status: DC | PRN
  Administered 2012-04-15 (×2): via INTRAVENOUS

## 2012-04-15 MED ORDER — FENTANYL CITRATE 0.05 MG/ML IJ SOLN
INTRAMUSCULAR | Status: DC | PRN
  Administered 2012-04-15: 50 ug via INTRAVENOUS

## 2012-04-15 MED ORDER — MIDAZOLAM HCL 5 MG/5ML IJ SOLN
INTRAMUSCULAR | Status: DC | PRN
  Administered 2012-04-15: 5 mg via INTRAVENOUS
  Administered 2012-04-15: 3 mg via INTRAVENOUS
  Administered 2012-04-15: 1 mg via INTRAVENOUS
  Administered 2012-04-15: 2 mg via INTRAVENOUS
  Administered 2012-04-15: 3 mg via INTRAVENOUS
  Administered 2012-04-15: 2 mg via INTRAVENOUS
  Administered 2012-04-15: 1 mg via INTRAVENOUS
  Administered 2012-04-15: 2 mg via INTRAVENOUS
  Administered 2012-04-15: 3 mg via INTRAVENOUS

## 2012-04-15 MED ORDER — ASPIRIN EC 325 MG PO TBEC
325.0000 mg | DELAYED_RELEASE_TABLET | Freq: Every day | ORAL | Status: DC
  Administered 2012-04-17 – 2012-04-19 (×3): 325 mg via ORAL
  Filled 2012-04-15 (×4): qty 1

## 2012-04-15 SURGICAL SUPPLY — 95 items
ACU-DISPO-CAUTERY ×1 IMPLANT
ADAPTER CARDIO PERF ANTE/RETRO (ADAPTER) ×3 IMPLANT
ADH SRG 12 PREFL SYR 3 SPRDR (MISCELLANEOUS) ×4
ADPR PRFSN 84XANTGRD RTRGD (ADAPTER) ×2
ANTEGRADE CPLG (MISCELLANEOUS) ×1 IMPLANT
APL SRG 7X2 LUM MLBL SLNT (VASCULAR PRODUCTS) ×2
APPLICATOR TIP BIOGLUE STANDRD (MISCELLANEOUS) ×2 IMPLANT
APPLICATOR TIP COSEAL (VASCULAR PRODUCTS) ×1 IMPLANT
ATTRACTOMAT 16X20 MAGNETIC DRP (DRAPES) ×3 IMPLANT
BAG DECANTER FOR FLEXI CONT (MISCELLANEOUS) ×3 IMPLANT
BLADE STERNUM SYSTEM 6 (BLADE) ×3 IMPLANT
BLADE SURG 15 STRL LF DISP TIS (BLADE) ×2 IMPLANT
BLADE SURG 15 STRL SS (BLADE) ×3
CANISTER SUCTION 2500CC (MISCELLANEOUS) ×4 IMPLANT
CANNULA GUNDRY RCSP 15FR (MISCELLANEOUS) ×3 IMPLANT
CARDIAC SUCTION (MISCELLANEOUS) ×1 IMPLANT
CATH ROBINSON RED A/P 18FR (CATHETERS) ×9 IMPLANT
CATH THORACIC 36FR (CATHETERS) ×3 IMPLANT
CATH THORACIC 36FR RT ANG (CATHETERS) ×3 IMPLANT
CAUTERY EYE LOW TEMP 1300F FIN (OPHTHALMIC RELATED) ×2 IMPLANT
CAUTERY HIGH TEMP VAS (MISCELLANEOUS) ×3 IMPLANT
CLOTH BEACON ORANGE TIMEOUT ST (SAFETY) ×3 IMPLANT
CONN ST 1/4X3/8  BEN (MISCELLANEOUS) ×1
CONN ST 1/4X3/8 BEN (MISCELLANEOUS) ×1 IMPLANT
CONT SPEC 4OZ CLIKSEAL STRL BL (MISCELLANEOUS) ×1 IMPLANT
CONT SPEC STER OR (MISCELLANEOUS) ×3 IMPLANT
COVER SURGICAL LIGHT HANDLE (MISCELLANEOUS) ×6 IMPLANT
CRADLE DONUT ADULT HEAD (MISCELLANEOUS) ×3 IMPLANT
DRAPE SLUSH MACHINE 52X66 (DRAPES) IMPLANT
DRAPE SLUSH/WARMER DISC (DRAPES) ×1 IMPLANT
DRSG COVADERM 4X14 (GAUZE/BANDAGES/DRESSINGS) ×3 IMPLANT
ELECT CAUTERY BLADE 6.4 (BLADE) ×4 IMPLANT
ELECT REM PT RETURN 9FT ADLT (ELECTROSURGICAL) ×6
ELECTRODE REM PT RTRN 9FT ADLT (ELECTROSURGICAL) ×4 IMPLANT
FELT TEFLON 6X6 (MISCELLANEOUS) ×1 IMPLANT
GLOVE BIO SURGEON STRL SZ 6 (GLOVE) ×7 IMPLANT
GLOVE BIO SURGEON STRL SZ 6.5 (GLOVE) ×2 IMPLANT
GLOVE BIO SURGEON STRL SZ7 (GLOVE) IMPLANT
GLOVE BIO SURGEON STRL SZ7.5 (GLOVE) ×2 IMPLANT
GLOVE BIOGEL PI IND STRL 7.0 (GLOVE) IMPLANT
GLOVE BIOGEL PI INDICATOR 7.0 (GLOVE) ×3
GLOVE EUDERMIC 7 POWDERFREE (GLOVE) ×6 IMPLANT
GOWN PREVENTION PLUS XLARGE (GOWN DISPOSABLE) ×4 IMPLANT
GOWN STRL NON-REIN LRG LVL3 (GOWN DISPOSABLE) ×16 IMPLANT
GRAFT HEMASHIELD 30X10 (Vascular Products) ×2 IMPLANT
HEART VENT LT CURVED (MISCELLANEOUS) ×3 IMPLANT
HEMOSHIELD GOLD 8MMX 30CM GRAFT ×1 IMPLANT
HEMOSTAT POWDER SURGIFOAM 1G (HEMOSTASIS) ×9 IMPLANT
HEMOSTAT SURGICEL 2X14 (HEMOSTASIS) ×3 IMPLANT
KIT BASIN OR (CUSTOM PROCEDURE TRAY) ×3 IMPLANT
KIT CATH CPB BARTLE (MISCELLANEOUS) ×3 IMPLANT
KIT ROOM TURNOVER OR (KITS) ×3 IMPLANT
KIT SUCTION CATH 14FR (SUCTIONS) ×3 IMPLANT
LINE VENT (MISCELLANEOUS) ×1 IMPLANT
NS IRRIG 1000ML POUR BTL (IV SOLUTION) ×16 IMPLANT
PACK OPEN HEART (CUSTOM PROCEDURE TRAY) ×3 IMPLANT
PAD ARMBOARD 7.5X6 YLW CONV (MISCELLANEOUS) ×6 IMPLANT
PENCIL BUTTON HOLSTER BLD 10FT (ELECTRODE) ×1 IMPLANT
SEALANT SURG COSEAL 8ML (VASCULAR PRODUCTS) ×1 IMPLANT
SET CARDIOPLEGIA MPS 5001102 (MISCELLANEOUS) ×1 IMPLANT
SPONGE GAUZE 4X4 12PLY (GAUZE/BANDAGES/DRESSINGS) ×4 IMPLANT
SPONGE LAP 18X18 X RAY DECT (DISPOSABLE) ×1 IMPLANT
SPONGE LAP 4X18 X RAY DECT (DISPOSABLE) ×4 IMPLANT
SUT BONE WAX W31G (SUTURE) ×4 IMPLANT
SUT ETHIBON 2 0 V 52N 30 (SUTURE) ×6 IMPLANT
SUT ETHIBON EXCEL 2-0 V-5 (SUTURE) IMPLANT
SUT ETHIBOND 2 0 SH (SUTURE) ×3
SUT ETHIBOND 2 0 SH 36X2 (SUTURE) IMPLANT
SUT ETHIBOND V-5 VALVE (SUTURE) IMPLANT
SUT PROLENE 3 0 RB 1 (SUTURE) ×2 IMPLANT
SUT PROLENE 3 0 SH 1 (SUTURE) ×5 IMPLANT
SUT PROLENE 3 0 SH DA (SUTURE) ×11 IMPLANT
SUT PROLENE 4 0 RB 1 (SUTURE) ×12
SUT PROLENE 4-0 RB1 .5 CRCL 36 (SUTURE) ×8 IMPLANT
SUT PROLENE 5 0 RB 2 (SUTURE) ×6 IMPLANT
SUT STEEL 6MS V (SUTURE) IMPLANT
SUT STEEL STERNAL CCS#1 18IN (SUTURE) IMPLANT
SUT STEEL SZ 6 DBL 3X14 BALL (SUTURE) ×3 IMPLANT
SUT VIC AB 1 CTX 36 (SUTURE) ×6
SUT VIC AB 1 CTX36XBRD ANBCTR (SUTURE) ×4 IMPLANT
SUT VIC AB 2-0 CT1 27 (SUTURE) ×3
SUT VIC AB 2-0 CT1 TAPERPNT 27 (SUTURE) IMPLANT
SUT VIC AB 3-0 X1 27 (SUTURE) ×1 IMPLANT
SUTURE E-PAK OPEN HEART (SUTURE) ×3 IMPLANT
SYR 10ML KIT SKIN ADHESIVE (MISCELLANEOUS) ×2 IMPLANT
SYSTEM SAHARA CHEST DRAIN ATS (WOUND CARE) ×3 IMPLANT
TAPE CLOTH SURG 4X10 WHT LF (GAUZE/BANDAGES/DRESSINGS) ×2 IMPLANT
TOWEL OR 17X24 6PK STRL BLUE (TOWEL DISPOSABLE) ×3 IMPLANT
TOWEL OR 17X26 10 PK STRL BLUE (TOWEL DISPOSABLE) ×3 IMPLANT
TRAY FOLEY IC TEMP SENS 14FR (CATHETERS) ×3 IMPLANT
TUBE CONNECTING 12X1/4 (SUCTIONS) ×1 IMPLANT
TUBE SUCT INTRACARD DLP 20F (MISCELLANEOUS) ×3 IMPLANT
UNDERPAD 30X30 INCONTINENT (UNDERPADS AND DIAPERS) ×3 IMPLANT
WATER STERILE IRR 1000ML POUR (IV SOLUTION) ×6 IMPLANT
YANKAUER SUCT BULB TIP NO VENT (SUCTIONS) ×1 IMPLANT

## 2012-04-15 NOTE — Anesthesia Preprocedure Evaluation (Signed)
Anesthesia Evaluation  Patient identified by MRN, date of birth, ID band Patient awake    Reviewed: Allergy & Precautions, H&P , NPO status , Patient's Chart, lab work & pertinent test results  Airway Mallampati: II  Neck ROM: full    Dental   Pulmonary shortness of breath,          Cardiovascular hypertension, + dysrhythmias Atrial Fibrillation + Valvular Problems/Murmurs AS  Ascending aortic dissection   Neuro/Psych    GI/Hepatic GERD-  ,  Endo/Other  diabetes, Type 2obese  Renal/GU      Musculoskeletal   Abdominal   Peds  Hematology   Anesthesia Other Findings   Reproductive/Obstetrics                           Anesthesia Physical Anesthesia Plan  ASA: IV and emergent  Anesthesia Plan: General   Post-op Pain Management:    Induction: Intravenous  Airway Management Planned: Oral ETT  Additional Equipment: Arterial line, CVP, PA Cath, TEE and Ultrasound Guidance Line Placement  Intra-op Plan:   Post-operative Plan: Post-operative intubation/ventilation  Informed Consent: I have reviewed the patients History and Physical, chart, labs and discussed the procedure including the risks, benefits and alternatives for the proposed anesthesia with the patient or authorized representative who has indicated his/her understanding and acceptance.     Plan Discussed with: CRNA and Surgeon  Anesthesia Plan Comments:         Anesthesia Quick Evaluation

## 2012-04-15 NOTE — Progress Notes (Signed)
Transported to OR via bed with O2 and monitor. 2nd FFP transfusing without problems. Permit signed prior to transport.

## 2012-04-15 NOTE — ED Provider Notes (Signed)
Pt currently stable He is awaiting CT surgery evaluation for aortic dissection FFP has been ordered Currently SBP is 120.  He is in no distress Distal pulses x4 noted He agrees to be given FFP for coumadin reversal EKG reviewed   Joya Gaskins, MD 04/15/12 [phone]

## 2012-04-15 NOTE — Preoperative (Signed)
Beta Blockers   Reason not to administer Beta Blockers:Not Applicable 

## 2012-04-15 NOTE — ED Notes (Signed)
Results of lactic acid shown to Verl Bangs, MD

## 2012-04-15 NOTE — ED Notes (Signed)
FFP ready to be transfused. Secretary has gone to pick up FFP. Pt on the phone with close friend, seeking guidance about what he should do regarding surgery.

## 2012-04-15 NOTE — Progress Notes (Signed)
  Echocardiogram Echocardiogram Transesophageal has been performed.  Eric Rivers 04/15/2012, 2:36 PM

## 2012-04-15 NOTE — Addendum Note (Signed)
Addendum created 04/15/12 2120 by Jodell Cipro, CRNA   Modules edited: Anesthesia Flowsheet, Anesthesia LDA

## 2012-04-15 NOTE — Anesthesia Procedure Notes (Signed)
Procedure Name: Intubation Date/Time: 04/15/2012 1:19 PM Performed by: Charm Barges, Markevius Trombetta R Pre-anesthesia Checklist: Patient identified, Emergency Drugs available, Suction available, Patient being monitored and Timeout performed Patient Re-evaluated:Patient Re-evaluated prior to inductionOxygen Delivery Method: Circle system utilized Preoxygenation: Pre-oxygenation with 100% oxygen Intubation Type: IV induction Ventilation: Mask ventilation without difficulty Laryngoscope Size: Miller and 3 Grade View: Grade II Tube type: Oral Tube size: 8.5 mm Number of attempts: 1 Airway Equipment and Method: Stylet Placement Confirmation: ETT inserted through vocal cords under direct vision and positive ETCO2 Secured at: 23 cm Tube secured with: Tape Dental Injury: Teeth and Oropharynx as per pre-operative assessment

## 2012-04-15 NOTE — Brief Op Note (Addendum)
04/15/2012  7:02 PM  PATIENT:  Eric Rivers  79 y.o. male  PRE-OPERATIVE DIAGNOSIS:  ascending disection  POST-OPERATIVE DIAGNOSIS:  ascending aortic root dissection  PROCEDURE:  Procedure(s):   EMERGENT ASCENDING AORTIC  REPLACEMENT  -30 MM Hemashield Graft Supra-coronary tube graft up to innominate level.  TRANSESOPHAGEAL ECHOCARDIOGRAM (TEE) (N/A)  SURGEON:  Surgeon(s) and Role:    * Alleen Borne, MD - Primary  PHYSICIAN ASSISTANT: Erin Barrett PA-C  ANESTHESIA:   general  EBL:  Total I/O In: 200 [Blood:200] Out: -   BLOOD ADMINISTERED: CELLSAVER, 1 FFP and 1 pack PLTS  DRAINS: Mediastinal chest drains   LOCAL MEDICATIONS USED:  NONE  SPECIMEN:  Source of Specimen:  Ascending Aorta  DISPOSITION OF SPECIMEN:  PATHOLOGY  COUNTS:  YES  TOURNIQUET:  * No tourniquets in log *  DICTATION: .Dragon Dictation  PLAN OF CARE: Admit to inpatient   PATIENT DISPOSITION:  ICU - intubated and hemodynamically stable.   Delay start of Pharmacological VTE agent (>24hrs) due to surgical blood loss or risk of bleeding: yes   Findings: Acute type A dissection beginning at sinotubular junction and extending upwards, also extending into the innominate artery. The aortic valve has mild calcification, mild stenosis. Very calcified coronary arteries. Normal LV and RV.

## 2012-04-15 NOTE — ED Notes (Signed)
Pt now c/o of R arm pain.

## 2012-04-15 NOTE — OR Nursing (Signed)
2nd call to SICU charge  

## 2012-04-15 NOTE — H&P (Signed)
[address]            [place] [address]          [phone]      Eric Rivers is an 79 y.o. male.   Chief Complaint:  Type A Aortic dissection HPI:   79 yo gentleman with a history of diabetes, hypertension, chronic atrial fibrillation treated with Coumadin, and recently diagnosed prostate cancer who is undergoing radiation therapy and was in his usual state of health when he woke up overnight to go to the bathroom. He developed severe headache and chest pain which did not resolve and he called EMS. In the emergency room he was hemodynamically stable and underwent a CT scan of the chest to rule out pulmonary embolism. This did not show pulmonary embolism but did show a type A aortic dissection. Unfortunately there was no significant contrast within the aorta but it is possible to see the dissection plane due to calcification in the aortic wall that has been displaced. There is a small pericardial effusion. He has been followed by Dr. Myrtis Ser and Dr. Debby Bud. He had a 2-D echocardiogram in December 2011 that showed mild aortic stenosis with a peak gradient of 22 and a mean gradient of 11 and an aortic valve area of 1.5 cm. He has not had a followup echocardiogram since. CTA of the chest in 2006 did not show any aortic aneurysm or dissection.  Past Medical History  Diagnosis Date  . Whooping cough   . Bladder cancer     recurrence with TUR-B March '09  . Atrial fibrillation      Chronic,   24 hour holter, September, 2011.... atrial fib rate is controlled.... there is some bradycardia but  no marked pauses  . Diabetes mellitus     type 2  . Hypertension   . DDD (degenerative disc disease)     lumbar spine  . GERD (gastroesophageal reflux disease)   . Gout   . Fluid overload   . ACE-inhibitor cough   . Shortness of breath     on exertion  . Aortic stenosis     mild....echo... september... 2010/ mild... echo.[name] KitchenMarland KitchenDec, 2011  . Pulmonary hypertension       , echo, 01/2010  . Hyperlipidemia   . Hx of colonoscopy     approx. 10 years  . Warfarin anticoagulation     Atrial fib  . Ejection fraction     EF 60%, echo, 01/2010  . Normal nuclear stress test     06/2005 , also ABI normal 2007  . Hypercholesterolemia   . Fracture of metacarpal bone 11/30/11    Past Surgical History  Procedure Laterality Date  . Lumbar laminectomy      '02  . Tur-bt '06, '09    . Bladder cancer biopsy  10/16/2010    negative for malignancy  . Bladder microscopic  04/13/2007    high grade papillary urothelial lesions  . 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 Prostate biopsy  09/25/2011    GLEASON 3+3=6 AND 4+3=7    Family History  Problem Relation Age of Onset  . Prostate cancer Father 37    passed with prostate ca   Social History:  reports that he quit smoking about 37 years ago. His smoking use included Cigarettes. He smoked 1.00 pack per day.  He has never used smokeless tobacco. He reports that he drinks about 2.5 ounces of alcohol per week. He reports that he does not use illicit drugs.  Allergies: No Known Allergies   (Not in a hospital admission)  Results for orders placed during the hospital encounter of 04/15/12 (from the past 48 hour(s))  CBC     Status: Abnormal   Collection Time    04/15/12  5:09 AM      Result Value Range   WBC 8.9  4.0 - 10.5 K/uL   RBC 3.65 (*) 4.22 - 5.81 MIL/uL   Hemoglobin 12.1 (*) 13.0 - 17.0 g/dL   HCT 16.1 (*) 09.6 - 04.5 %   MCV 97.3  78.0 - 100.0 fL   MCH 33.2  26.0 - 34.0 pg   MCHC 34.1  30.0 - 36.0 g/dL   RDW 40.9  81.1 - 91.4 %   Platelets 132 (*) 150 - 400 K/uL  APTT     Status: None   Collection Time    04/15/12  5:09 AM      Result Value Range   aPTT 36  24 - 37 seconds  PROTIME-INR     Status: Abnormal   Collection Time    04/15/12  5:09 AM      Result Value Range   Prothrombin Time 26.3 (*) 11.6 - 15.2 seconds   INR 2.56 (*) 0.00 - 1.49  POCT  I-STAT TROPONIN I     Status: None   Collection Time    04/15/12  6:06 AM      Result Value Range   Troponin i, poc 0.00  0.00 - 0.08 ng/mL   Comment 3            Comment: Due to the release kinetics of cTnI,     a negative result within the first hours     of the onset of symptoms does not rule out     myocardial infarction with certainty.     If myocardial infarction is still suspected,     repeat the test at appropriate intervals.  POCT I-STAT, CHEM 8     Status: Abnormal   Collection Time    04/15/12  6:08 AM      Result Value Range   Sodium 140  135 - 145 mEq/L   Potassium 3.8  3.5 - 5.1 mEq/L   Chloride 103  96 - 112 mEq/L   BUN 20  6 - 23 mg/dL   Creatinine, Ser 7.82  0.50 - 1.35 mg/dL   Glucose, Bld 956 (*) 70 - 99 mg/dL   Calcium, Ion 2.13  0.86 - 1.30 mmol/L   TCO2 28  0 - 100 mmol/L   Hemoglobin 13.6  13.0 - 17.0 g/dL   HCT 57.8  46.9 - 62.9 %  PRO B NATRIURETIC PEPTIDE     Status: Abnormal   Collection Time    04/15/12  6:15 AM      Result Value Range   Pro B Natriuretic peptide (BNP) 643.0 (*) 0 - 450 pg/mL  CG4 I-STAT (LACTIC ACID)     Status: Abnormal   Collection Time    04/15/12  7:16 AM      Result Value Range   Lactic Acid, Venous 4.41 (*) 0.5 - 2.2 mmol/L  PREPARE FRESH FROZEN PLASMA     Status: None   Collection Time    04/15/12  7:50 AM      Result Value Range  Unit Number [id number]     Blood Component Type THAWED PLASMA     Unit division 00     Status of Unit ALLOCATED     Transfusion Status OK TO TRANSFUSE     Unit Number [id number]     Blood Component Type THAWED PLASMA     Unit division 00     Status of Unit ISSUED     Transfusion Status OK TO TRANSFUSE    TYPE AND SCREEN     Status: None   Collection Time    04/15/12  7:55 AM      Result Value Range   ABO/RH(D) O POS     Antibody Screen NEG     Sample Expiration 04/18/2012    ABO/RH     Status: None   Collection Time    04/15/12  7:55 AM      Result Value Range    ABO/RH(D) O POS     Ct Angio Chest Pe W/cm &/or Wo Cm  04/15/2012  *RADIOLOGY REPORT*  Clinical Data: 79 year old male with shortness of breath and substernal chest pain.  History of bladder cancer, pulmonary hypertension, aortic stenosis.  CT ANGIOGRAPHY CHEST  Technique:  Multidetector CT imaging of the chest using the standard protocol during bolus administration of intravenous contrast. Multiplanar reconstructed images including MIPs were obtained and reviewed to evaluate the vascular anatomy.  Contrast: OMNIPAQUE IOHEXOL 350 MG/ML SOLN  Comparison: Chest CT with contrast 04/29/2004.  Abdomen CT 03/15/2004.  Findings: Good contrast bolus timing in the pulmonary arterial tree.  No focal filling defect identified in the pulmonary arterial tree to suggest the presence of acute pulmonary embolism.  Although there is virtually no IV contrast in the aorta, there is clear evidence of a type A aortic dissection which can be traced nearly to the aortic root and throughout the arch into the descending aorta. This is new since 04/29/2004.  Some portions of the neural flap are calcified.  The dissection clearly extends throughout the descending aorta and into the visible proximal abdominal aorta as far as the lowest images of the study.  There is a small volume hyperdense pericardial effusion at the superior pericardial recess.  Along the base of the heart there is little to no pericardial fluid.  There is some reflux of IV contrast into a dilated hepatic IVC and somewhat dilated hepatic veins.  No pleural effusion.  Small mediastinal lymph nodes are similar to the prior study. Small calcified left thyroid nodule.  No axillary lymphadenopathy. Grossly stable visualized upper abdominal viscera.  Atelectatic changes to the major airways.  Mild dependent pulmonary atelectasis.  No pulmonary consolidation.  No acute osseous abnormality identified.  IMPRESSION: 1. Stanford type A (DeBakey type 1) aortic dissection.   There is little contrast in the aorta on this study (by design).  A CTA could be repeated with aortic contrast timing if necessary. 2.  Small volume hemopericardium in the superior pericardial recess.  Cardiomegaly similar to the 2006 study. 3. No focal filling defect identified in the pulmonary arterial tree to suggest the presence of acute pulmonary embolism. Pulmonary atelectasis.  Critical Value/emergent results were called by telephone at the time of interpretation on 04/15/2012 at 0727 hours to Dr. Verl Bangs in the ED, who verbally acknowledged these results.   Original Report Authenticated By: Erskine Speed, M.D.    Dg Chest Port 1 View  04/15/2012  *RADIOLOGY REPORT*  Clinical Data: Chest pain and hypotension; nausea.  PORTABLE CHEST -  1 VIEW  Comparison: Chest radiograph performed 10/02/2011  Findings: The lungs are well-aerated.  Vascular congestion is noted, with bibasilar airspace opacities, concerning for mild pulmonary edema.  There is no evidence of pleural effusion or pneumothorax; the left lung base is incompletely imaged on this study.  The cardiomediastinal silhouette is mildly enlarged.  No acute osseous abnormalities are seen.  IMPRESSION: Vascular congestion and mild cardiomegaly, with bibasilar airspace opacities, concerning for mild pulmonary edema.   Original Report Authenticated By: Tonia Ghent, M.D.     Review of Systems  Constitutional: Negative for fever, chills, weight loss and malaise/fatigue.  HENT:       With this acute episode.  Eyes: Negative.   Respiratory: Negative for cough, hemoptysis, sputum production and shortness of breath.   Cardiovascular: Positive for chest pain and palpitations. Negative for orthopnea, leg swelling and PND.  Gastrointestinal: Negative.   Genitourinary: Positive for frequency.  Musculoskeletal: Positive for back pain and joint pain.  Skin: Negative.   Neurological: Positive for sensory change and headaches.       Developing some numbness  in feet due to diabetes.  Endo/Heme/Allergies: Bruises/bleeds easily.  Psychiatric/Behavioral: Negative.     Blood pressure 173/76, pulse 56, temperature 98.3 F (36.8 C), temperature source Oral, resp. rate 16, SpO2 100.00%. Physical Exam  Constitutional: He is oriented to person, place, and time. He appears well-developed and well-nourished. No distress.  HENT:  Head: Normocephalic and atraumatic.  Mouth/Throat: Oropharynx is clear and moist.  Eyes: Conjunctivae and EOM are normal. Pupils are equal, round, and reactive to light.  Neck: Normal range of motion. Neck supple. No JVD present. No tracheal deviation present. No thyromegaly present.  Cardiovascular: Normal rate, regular rhythm, normal heart sounds and intact distal pulses.   No murmur heard. Right radial pulse palpable but decreased compared to left.  Femoral and dp/pt pulses palpable and equal.  Respiratory: Effort normal and breath sounds normal. No respiratory distress. He has no rales.  GI: Soft. Bowel sounds are normal. He exhibits no distension and no mass. There is no tenderness.  Musculoskeletal: Normal range of motion. He exhibits no edema.  Lymphadenopathy:    He has no cervical adenopathy.  Neurological: He is alert and oriented to person, place, and time. He has normal strength. No cranial nerve deficit or sensory deficit.  Skin: Skin is warm and dry.  Psychiatric: He has a normal mood and affect.     Assessment/Plan  He is a 79 year old inactive gentleman with an acute type A dissection who has remained hemodynamically stable but hypertensive. His operative risk is high due to his age, diabetes, chronic atrial fibrillation on Coumadin, prostate cancer currently undergoing radiation therapy, known aortic stenosis, and likely significant coronary disease. His left and right coronary system are completely calcified on CT scan which would correlate with a high likelihood of significant coronary disease. He had mild  aortic stenosis by echocardiogram 2 years ago but has not had a repeat study. I discussed the operative recommendation for acute type A dissection including benefits and high risks. I discussed the risks of bleeding, transfusion, infection, stroke, myocardial infarction, heart block requiring pacemaker, organ dysfunction, and death. I think his risk for major morbidity or mortality is at least 50%. I also discussed the option of continued medical therapy with good blood pressure control which is not completely unreasonable in his case. I told him that there is a significant risk of death with or without surgery and in his age group with  his risk factors I think it is probably equal. He is not sure that he wants to undergo surgery and would like to think about it further. 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 He would like to discuss it further with a friend. I think the best course of action at this time is to admit him to the intensive care unit for tight blood pressure control with Labetalol and nipride as needed and correction of his INR with fresh frozen plasma while he is deciding about surgical treatment.  Taylorann Tkach K 04/15/2012, 9:47 AM

## 2012-04-15 NOTE — Plan of Care (Signed)
Problem: Consults Goal: Cardiac Surgery Patient Education ( See Patient Education module for education specifics.) Outcome: Progressing Emergent TAA surgery. Dr. Laneta Simmers talked with patient and his HCPOA regarding surgery. RN discussed lines and drips he would have after surgery.

## 2012-04-15 NOTE — ED Notes (Addendum)
Pt blood pressure measuring 60's systolic on R arm, switch to left arm and it reads 148 systolic. Pt has strong L radial pulse with weak R radial pulse. Cap refill <3 and skin is warm and dry. Pt appears pale. MD made aware.

## 2012-04-15 NOTE — Addendum Note (Signed)
Addendum created 04/15/12 2121 by Jodell Cipro, CRNA   Modules edited: Anesthesia Events

## 2012-04-15 NOTE — ED Provider Notes (Addendum)
History     CSN: [id number]  Arrival date & time 04/15/12  0504   First MD Initiated Contact with Patient 04/15/12 [phone]      Chief Complaint  Patient presents with  . Chest Pain    (Consider location/radiation/quality/duration/timing/severity/associated sxs/prior treatment) HPI  Past Medical History  Diagnosis Date  . Whooping cough   . Bladder cancer     recurrence with TUR-B March '09  . Atrial fibrillation      Chronic,   24 hour holter, September, 2011.... atrial fib rate is controlled.... there is some bradycardia but  no marked pauses  . Diabetes mellitus     type 2  . Hypertension   . DDD (degenerative disc disease)     lumbar spine  . GERD (gastroesophageal reflux disease)   . Gout   . Fluid overload   . ACE-inhibitor cough   . Shortness of breath     on exertion  . Aortic stenosis     mild....echo... september... 2010/ mild... echo.[name] KitchenMarland KitchenDec, 2011  . Pulmonary hypertension     , echo, 01/2010  . Hyperlipidemia   . Hx of colonoscopy     approx. 10 years  . Warfarin anticoagulation     Atrial fib  . Ejection fraction     EF 60%, echo, 01/2010  . Normal nuclear stress test     06/2005 , also ABI normal 2007  . Hypercholesterolemia   . Fracture of metacarpal bone 11/30/11    Past Surgical History  Procedure Laterality Date  . Lumbar laminectomy      '02  . Tur-bt '06, '09    . Bladder cancer biopsy  10/16/2010    negative for malignancy  . Bladder microscopic  04/13/2007    high grade papillary urothelial lesions  . 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 Prostate biopsy  09/25/2011    GLEASON 3+3=6 AND 4+3=7    Family History  Problem Relation Age of Onset  . Prostate cancer Father 49    passed with prostate ca    History  Substance Use Topics  . Smoking status: Former Smoker -- 1.00 packs/day    Types: Cigarettes    Quit date: 03/04/1975  . Smokeless tobacco: Never Used  . Alcohol Use: 2.5 oz/week     5 drink(s) per week      Review of Systems  Allergies  Review of patient's allergies indicates no known allergies.  Home Medications   Current Outpatient Rx  Name  Route  Sig  Dispense  Refill  . allopurinol (ZYLOPRIM) 300 MG tablet      take 1 tablet by mouth once daily   90 tablet   3   . atorvastatin (LIPITOR) 20 MG tablet      take 1 tablet by mouth once daily   90 tablet   3   . Calcium Carbonate-Vitamin D (CALCIUM + D PO)   Oral   Take 1 tablet by mouth daily.         . CELEBREX 200 MG capsule      take 1 capsule by mouth once daily   90 capsule   3   . finasteride (PROSCAR) 5 MG tablet   Oral   Take 5 mg by mouth daily.           . furosemide (LASIX) 40 MG tablet      take 1 tablet by mouth once daily   90 tablet  3   . KLOR-CON M20 20 MEQ tablet      take 1 tablet by mouth once daily   90 tablet   3   . Multiple Vitamins-Minerals (ONCOVITE PO)   Oral   Take by mouth.           . pantoprazole (PROTONIX) 40 MG tablet      take 1 tablet by mouth once daily   90 tablet   3   . senna (SENOKOT) 8.6 MG tablet   Oral   Take 1 tablet by mouth daily.           . sitaGLIPtin (JANUVIA) 100 MG tablet   Oral   Take 1 tablet (100 mg total) by mouth daily.   30 tablet   0   . Tamsulosin HCl (FLOMAX) 0.4 MG CAPS   Oral   Take 0.4 mg by mouth at bedtime. Restarted flomax  03/12/12 after talking with MD         . verapamil (COVERA HS) 240 MG (CO) 24 hr tablet   Oral   Take 240 mg by mouth every morning.         . warfarin (COUMADIN) 4 MG tablet   Oral   Take 4-6 mg by mouth daily. Takes 4mg  daily except, takes 6 mg on Sunday         . zolpidem (AMBIEN) 5 MG tablet   Oral   Take 1 tablet (5 mg total) by mouth at bedtime as needed for sleep.   90 tablet   1     BP 96/41  Pulse 53  Temp(Src) 97.5 F (36.4 C) (Oral)  Resp 16  SpO2 96%  Physical Exam  ED Course  Procedures (including critical care time)  Labs  Reviewed  CBC - Abnormal; Notable for the following:    RBC 3.65 (*)    Hemoglobin 12.1 (*)    HCT 35.5 (*)    Platelets 132 (*)    All other components within normal limits  PROTIME-INR - Abnormal; Notable for the following:    Prothrombin Time 26.3 (*)    INR 2.56 (*)    All other components within normal limits  PRO B NATRIURETIC PEPTIDE - Abnormal; Notable for the following:    Pro B Natriuretic peptide (BNP) 643.0 (*)    All other components within normal limits  POCT I-STAT, CHEM 8 - Abnormal; Notable for the following:    Glucose, Bld 176 (*)    All other components within normal limits  CG4 I-STAT (LACTIC ACID) - Abnormal; Notable for the following:    Lactic Acid, Venous 4.41 (*)    All other components within normal limits  APTT  POCT I-STAT TROPONIN I   Dg Chest Port 1 View  04/15/2012  *RADIOLOGY REPORT*  Clinical Data: Chest pain and hypotension; nausea.  PORTABLE CHEST - 1 VIEW  Comparison: Chest radiograph performed 10/02/2011  Findings: The lungs are well-aerated.  Vascular congestion is noted, with bibasilar airspace opacities, concerning for mild pulmonary edema.  There is no evidence of pleural effusion or pneumothorax; the left lung base is incompletely imaged on this study.  The cardiomediastinal silhouette is mildly enlarged.  No acute osseous abnormalities are seen.  IMPRESSION: Vascular congestion and mild cardiomegaly, with bibasilar airspace opacities, concerning for mild pulmonary edema.   Original Report Authenticated By: Tonia Ghent, M.D.      1. Chest pain    CRITICAL CARE Performed by: Rosanne Ashing   Total critical  care time:  Critical care time was exclusive of separately billable procedures and treating other patients.  Critical care was necessary to treat or prevent imminent or life-threatening deterioration.  Critical care was time spent personally by me on the following activities: development of treatment plan with patient  and/or surrogate as well as nursing, discussions with consultants, evaluation of patient's response to treatment, examination of patient, obtaining history from patient or surrogate, ordering and performing treatments and interventions, ordering and review of laboratory studies, ordering and review of radiographic studies, pulse oximetry and re-evaluation of patient's condition.   MDM  + chest pain,  Decreased pulse in rt arm.  Concern for aortic dissection, vs clot with hx of afib.  Aortic dissection on ct.  Discussed with ct surgery,  Will admit        Gayna Braddy Lytle Michaels, MD 04/15/12 0725  Susie Pousson Lytle Michaels, MD 04/15/12 1610  Quintina Hakeem Lytle Michaels, MD 04/15/12 [phone]

## 2012-04-15 NOTE — ED Notes (Signed)
Attempt to call report x 1. Pt currently using phone to call the newspaper and pause his subscription.

## 2012-04-15 NOTE — ED Notes (Signed)
Per ems- Pt reports that he woke up with headache. Then he began to have cp.  ems administered 2 nitro dropping his b/p 180 to 108 systolic. Also 324 asa was administered. Slight nausea and sob initially.

## 2012-04-15 NOTE — ED Notes (Signed)
Called phlebotomy and notified need for type and screen.

## 2012-04-15 NOTE — OR Nursing (Signed)
SICU charge nurse 1st call made

## 2012-04-15 NOTE — Anesthesia Postprocedure Evaluation (Signed)
  Anesthesia Post-op Note  Patient: Eric Rivers  Procedure(s) Performed: Procedure(s): ASCENDING AORTIC ROOT REPLACEMENT (N/A) TRANSESOPHAGEAL ECHOCARDIOGRAM (TEE) (N/A)  Patient Location: SICU  Anesthesia Type:General  Level of Consciousness: sedated and Patient remains intubated per anesthesia plan  Airway and Oxygen Therapy: Patient remains intubated per anesthesia plan and Patient placed on Ventilator (see vital sign flow sheet for setting)  Post-op Pain: none  Post-op Assessment: Post-op Vital signs reviewed, Patient's Cardiovascular Status Stable, Respiratory Function Stable, Patent Airway, No signs of Nausea or vomiting and Pain level controlled  Post-op Vital Signs: stable  Complications: No apparent anesthesia complications

## 2012-04-15 NOTE — ED Notes (Signed)
Vascular surgeon at bedside.

## 2012-04-15 NOTE — Progress Notes (Signed)
Patient ID: Eric Rivers, male   DOB: [birth year], 79 y.o.   MRN: [id number]   Patient has discussed surgery with Dr. Arthur Holms and his healthcare power of attorney and has decided to have surgery. I discussed the operative procedure with the patient and his health care power of attorney including alternatives, benefits and risks; including but not limited to bleeding, blood transfusion, infection, stroke, myocardial infarction, heart block requiring a permanent pacemaker, organ dysfunction, and death.  [name] understands and agrees to proceed.  We will proceed to OR soon as room is ready.

## 2012-04-15 NOTE — Transfer of Care (Signed)
Immediate Anesthesia Transfer of Care Note  Patient: Eric Rivers  Procedure(s) Performed: Procedure(s): ASCENDING AORTIC ROOT REPLACEMENT (N/A) TRANSESOPHAGEAL ECHOCARDIOGRAM (TEE) (N/A)  Patient Location: SICU  Anesthesia Type:General  Level of Consciousness: Patient remains intubated per anesthesia plan  Airway & Oxygen Therapy: Patient remains intubated per anesthesia plan and Patient placed on Ventilator (see vital sign flow sheet for setting)  Post-op Assessment: Report given to PACU RN and Post -op Vital signs reviewed and stable  Post vital signs: Reviewed and stable  Complications: No apparent anesthesia complications

## 2012-04-15 NOTE — ED Notes (Signed)
PT TRANSPORTED TO CT 

## 2012-04-15 NOTE — ED Notes (Signed)
Pt is alert and oriented. Pt informed of plan, pt using bedside commode at current. Type and screen sample has been drawn. Spoke with blood bank, notified need FFP within 30 mins otherwise will need emergency release of FFP.

## 2012-04-15 NOTE — ED Provider Notes (Addendum)
History     CSN: [id number]  Arrival date & time 04/15/12  0504   First MD Initiated Contact with Patient 04/15/12 [phone]      Chief Complaint  Patient presents with  . Chest Pain    (Consider location/radiation/quality/duration/timing/severity/associated sxs/prior treatment) Patient is a 79 y.o. male presenting with chest pain. The history is provided by the patient.  Chest Pain Pain location:  Substernal area Pain quality: aching   Pain radiates to:  Neck Pain radiates to the back: no   Pain severity:  No pain Onset quality:  Sudden Duration:  10 minutes Timing:  Constant Progression:  Resolved Chronicity:  New Relieved by:  Nitroglycerin Worsened by:  Nothing tried Ineffective treatments:  None tried   Past Medical History  Diagnosis Date  . Whooping cough   . Bladder cancer     recurrence with TUR-B March '09  . Atrial fibrillation      Chronic,   24 hour holter, September, 2011.... atrial fib rate is controlled.... there is some bradycardia but  no marked pauses  . Diabetes mellitus     type 2  . Hypertension   . DDD (degenerative disc disease)     lumbar spine  . GERD (gastroesophageal reflux disease)   . Gout   . Fluid overload   . ACE-inhibitor cough   . Shortness of breath     on exertion  . Aortic stenosis     mild....echo... september... 2010/ mild... echo.[name] KitchenMarland KitchenDec, 2011  . Pulmonary hypertension     , echo, 01/2010  . Hyperlipidemia   . Hx of colonoscopy     approx. 10 years  . Warfarin anticoagulation     Atrial fib  . Ejection fraction     EF 60%, echo, 01/2010  . Normal nuclear stress test     06/2005 , also ABI normal 2007  . Hypercholesterolemia   . Fracture of metacarpal bone 11/30/11    Past Surgical History  Procedure Laterality Date  . Lumbar laminectomy      '02  . Tur-bt '06, '09    . Bladder cancer biopsy  10/16/2010    negative for malignancy  . Bladder microscopic  04/13/2007    high grade papillary urothelial lesions   . 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 Prostate biopsy  09/25/2011    GLEASON 3+3=6 AND 4+3=7    Family History  Problem Relation Age of Onset  . Prostate cancer Father 9    passed with prostate ca    History  Substance Use Topics  . Smoking status: Former Smoker -- 1.00 packs/day    Types: Cigarettes    Quit date: 03/04/1975  . Smokeless tobacco: Never Used  . Alcohol Use: 2.5 oz/week    5 drink(s) per week      Review of Systems  Cardiovascular: Positive for chest pain.  All other systems reviewed and are negative.    Allergies  Review of patient's allergies indicates no known allergies.  Home Medications   Current Outpatient Rx  Name  Route  Sig  Dispense  Refill  . allopurinol (ZYLOPRIM) 300 MG tablet      take 1 tablet by mouth once daily   90 tablet   3   . atorvastatin (LIPITOR) 20 MG tablet   Oral   Take 20 mg by mouth daily.           [name] Kitchen atorvastatin (LIPITOR) 20 MG tablet  take 1 tablet by mouth once daily   90 tablet   3   . Calcium Carbonate-Vitamin D (CALCIUM + D PO)   Oral   Take 1 tablet by mouth daily.         . Calcium-Vitamin D 600-125 MG-UNIT TABS   Oral   Take 600 mg by mouth 3 (three) times daily.         . CELEBREX 200 MG capsule      take 1 capsule by mouth once daily   90 capsule   3   . finasteride (PROSCAR) 5 MG tablet   Oral   Take 5 mg by mouth daily.           . furosemide (LASIX) 40 MG tablet      take 1 tablet by mouth once daily   90 tablet   3   . JANUVIA 100 MG tablet      take 1 tablet by mouth once daily   90 tablet   2   . ketoconazole (NIZORAL) 2 % shampoo   Topical   Apply 1 application topically Daily.         [name] Kitchen KLOR-CON M20 20 MEQ tablet      take 1 tablet by mouth once daily   90 tablet   3   . Multiple Vitamins-Minerals (ONCOVITE PO)   Oral   Take by mouth.           . pantoprazole (PROTONIX) 40 MG tablet      take 1  tablet by mouth once daily   90 tablet   3   . senna (SENOKOT) 8.6 MG tablet   Oral   Take 1 tablet by mouth daily.           . sitaGLIPtin (JANUVIA) 100 MG tablet   Oral   Take 1 tablet (100 mg total) by mouth daily.   30 tablet   0   . Tamsulosin HCl (FLOMAX) 0.4 MG CAPS   Oral   Take 0.4 mg by mouth at bedtime. Restarted flomax  03/12/12 after talking with MD         . verapamil (COVERA HS) 240 MG (CO) 24 hr tablet   Oral   Take 240 mg by mouth every morning.         . warfarin (COUMADIN) 4 MG tablet               . zolpidem (AMBIEN) 5 MG tablet   Oral   Take 1 tablet (5 mg total) by mouth at bedtime as needed for sleep.   90 tablet   1     There were no vitals taken for this visit.  Physical Exam  Vitals reviewed. Constitutional: He is oriented to person, place, and time. He appears well-developed and well-nourished.  HENT:  Head: Normocephalic and atraumatic.  Eyes: Conjunctivae are normal. Pupils are equal, round, and reactive to light.  Neck: Normal range of motion. Neck supple.  Cardiovascular: Normal rate and normal heart sounds.  An irregularly irregular rhythm present.  Diminished rt radial pulse  Pulmonary/Chest: Effort normal and breath sounds normal.  Abdominal: Soft. Bowel sounds are normal.  Neurological: He is alert and oriented to person, place, and time.  Skin: Skin is warm and dry.  Psychiatric: He has a normal mood and affect. His behavior is normal. Judgment and thought content normal.    ED Course  Procedures (including critical care time)  Labs Reviewed  CBC  APTT  PROTIME-INR   No results found.   No diagnosis found.   Date: 04/15/2012  Rate: 53  Rhythm: atrial fibrillation  QRS Axis: left  Intervals: normal  ST/T Wave abnormalities: nonspecific ST changes  Conduction Disutrbances:none  Narrative Interpretation:   Old EKG Reviewed: none available and unchanged   MDM  + cp,  relieved with nitro.  Old afib on  coumadin.  No recent stress or cath.  WIll lab, xray,  reassess         Rosanne Ashing, MD 04/15/12 1191  Rosanne Ashing, MD 04/15/12 [phone]

## 2012-04-15 NOTE — Plan of Care (Signed)
Problem: Phase I Progression Outcomes Goal: Point person for discharge identified Outcome: Completed/Met Date Met:  04/15/12 Dr. Daleen Snook

## 2012-04-16 ENCOUNTER — Ambulatory Visit: Payer: Medicare Other

## 2012-04-16 ENCOUNTER — Inpatient Hospital Stay (HOSPITAL_COMMUNITY): Payer: Medicare Other

## 2012-04-16 DIAGNOSIS — I7101 Dissection of thoracic aorta: Secondary | ICD-10-CM | POA: Diagnosis present

## 2012-04-16 LAB — PREPARE FRESH FROZEN PLASMA
Unit division: 0
Unit division: 0
Unit division: 0

## 2012-04-16 LAB — BASIC METABOLIC PANEL
GFR calc Af Amer: >90 mL/min (ref >90)
GFR calc non Af Amer: 81 mL/min — ABNORMAL LOW (ref >90)
Glucose, Bld: 160 mg/dL — ABNORMAL HIGH (ref 70–99)
Potassium: 3.8 mEq/L (ref 3.5–5.1)
Sodium: 143 mEq/L (ref 135–145)

## 2012-04-16 LAB — POCT I-STAT 3, ART BLOOD GAS (G3+)
Bicarbonate: 22.1 mEq/L (ref 20.0–24.0)
Bicarbonate: 22.3 mEq/L (ref 20.0–24.0)
Patient temperature: 37
Patient temperature: 37.2
pH, Arterial: 7.364 (ref 7.350–7.450)
pH, Arterial: 7.32 — ABNORMAL LOW (ref 7.350–7.450)
pO2, Arterial: 92 mmHg (ref 80.0–100.0)

## 2012-04-16 LAB — POCT I-STAT, CHEM 8
Calcium, Ion: 1.2 mmol/L (ref 1.13–1.30)
Chloride: 108 mEq/L (ref 96–112)
Creatinine, Ser: 1.5 mg/dL — ABNORMAL HIGH (ref 0.50–1.35)
Glucose, Bld: 139 mg/dL — ABNORMAL HIGH (ref 70–99)
HCT: 25 % — ABNORMAL LOW (ref 39.0–52.0)

## 2012-04-16 LAB — CBC
HCT: 24.2 % — ABNORMAL LOW (ref 39.0–52.0)
Hemoglobin: 7.9 g/dL — ABNORMAL LOW (ref 13.0–17.0)
Hemoglobin: 8.3 g/dL — ABNORMAL LOW (ref 13.0–17.0)
MCHC: 34.1 g/dL (ref 30.0–36.0)
MCV: 94.2 fL (ref 78.0–100.0)
Platelets: 94 10*3/uL — ABNORMAL LOW (ref 150–400)
RBC: 2.57 MIL/uL — ABNORMAL LOW (ref 4.22–5.81)
WBC: 16.9 10*3/uL — ABNORMAL HIGH (ref 4.0–10.5)

## 2012-04-16 LAB — PREPARE PLATELET PHERESIS: Unit division: 0

## 2012-04-16 LAB — GLUCOSE, CAPILLARY
Glucose-Capillary: 129 mg/dL — ABNORMAL HIGH (ref 70–99)
Glucose-Capillary: 132 mg/dL — ABNORMAL HIGH (ref 70–99)
Glucose-Capillary: 103 mg/dL — ABNORMAL HIGH (ref 70–99)
Glucose-Capillary: 124 mg/dL — ABNORMAL HIGH (ref 70–99)
Glucose-Capillary: 131 mg/dL — ABNORMAL HIGH (ref 70–99)

## 2012-04-16 LAB — CREATININE, SERUM: GFR calc Af Amer: 55 mL/min — ABNORMAL LOW (ref >90)

## 2012-04-16 MED ORDER — FUROSEMIDE 10 MG/ML IJ SOLN
80.0000 mg | Freq: Two times a day (BID) | INTRAMUSCULAR | Status: DC
  Administered 2012-04-16 – 2012-04-18 (×5): 80 mg via INTRAVENOUS
  Filled 2012-04-16 (×6): qty 8

## 2012-04-16 MED ORDER — INSULIN DETEMIR 100 UNIT/ML ~~LOC~~ SOLN
20.0000 [IU] | Freq: Two times a day (BID) | SUBCUTANEOUS | Status: DC
  Administered 2012-04-16 – 2012-04-17 (×4): 20 [IU] via SUBCUTANEOUS

## 2012-04-16 MED ORDER — FUROSEMIDE 10 MG/ML IJ SOLN
40.0000 mg | Freq: Two times a day (BID) | INTRAMUSCULAR | Status: DC
  Administered 2012-04-16: 40 mg via INTRAVENOUS

## 2012-04-16 MED ORDER — INSULIN ASPART 100 UNIT/ML ~~LOC~~ SOLN
0.0000 [IU] | SUBCUTANEOUS | Status: DC
  Administered 2012-04-16: 2 [IU] via SUBCUTANEOUS
  Administered 2012-04-17: 4 [IU] via SUBCUTANEOUS
  Administered 2012-04-17 – 2012-04-19 (×5): 2 [IU] via SUBCUTANEOUS

## 2012-04-16 MED ORDER — DEXTROSE 5 % IV SOLN
1.5000 g | Freq: Two times a day (BID) | INTRAVENOUS | Status: AC
  Administered 2012-04-16 – 2012-04-17 (×3): 1.5 g via INTRAVENOUS
  Filled 2012-04-16 (×3): qty 1.5

## 2012-04-16 NOTE — Progress Notes (Signed)
Utilization Review Completed.Eric Rivers T2/14/2014  

## 2012-04-16 NOTE — Progress Notes (Signed)
Patient ID: Eric Rivers, male   DOB: 1930/10/20, 79 y.o.   MRN: [id number]                   [address]            [place] [address]          [phone]     1 Day Post-Op Procedure(s) (LRB): ASCENDING AORTIC ROOT REPLACEMENT (N/A) TRANSESOPHAGEAL ECHOCARDIOGRAM (TEE) (N/A)  Total Length of Stay:  LOS: 1 day  BP 105/54  Pulse 92  Temp(Src) 97.9 F (36.6 C) (Oral)  Resp 17  Ht 5\' 8"  (1.727 m)  Wt 248 lb 3.8 oz (112.6 kg)  BMI 37.75 kg/m2  SpO2 100%  .Intake/Output     02/13 0701 - 02/14 0700 02/14 0701 - 02/15 0700   P.O.  400   I.V. (mL/kg) 4589.9 (40.8) 317.5 (2.8)   Blood 2299.5 262.5   IV Piggyback 1600    Total Intake(mL/kg) 8489.4 (75.4) 980 (8.7)   Urine (mL/kg/hr) 1495 (0.6) 425 (0.4)   Stool 1 (0)    Blood 3000 (1.1)    Chest Tube 270 (0.1)    Total Output 4766 425   Net +3723.4 +555          . sodium chloride Stopped (04/16/12 0900)  . sodium chloride 20 mL (04/15/12 2214)  . dexmedetomidine Stopped (04/16/12 0000)  . lactated ringers 20 mL/hr (04/15/12 2114)  . nitroGLYCERIN Stopped (04/15/12 2100)  . phenylephrine (NEO-SYNEPHRINE) Adult infusion Stopped (04/16/12 0800)     Lab Results  Component Value Date   WBC 13.3* 04/16/2012   HGB 7.9* 04/16/2012   HCT 23.2* 04/16/2012   PLT 104* 04/16/2012   GLUCOSE 160* 04/16/2012   CHOL 110 08/28/2011   TRIG 108.0 08/28/2011   HDL 40.70 08/28/2011   LDLCALC 48 08/28/2011   ALT 19 02/24/2012   AST 27 02/24/2012   NA 143 04/16/2012   K 3.8 04/16/2012   CL 109 04/16/2012   CREATININE 0.85 04/16/2012   BUN 16 04/16/2012   CO2 23 04/16/2012   TSH 1.47 08/21/2010   PSA 2.19 12/06/2007   INR 0.94 04/15/2012   HGBA1C 6.1 02/24/2012   MICROALBUR 2.3* 06/03/2006   Awake and alert, walking in room Now paced  ddd at 90   Delight Ovens MD  Beeper [phone] Office [phone] 04/16/2012 3:47 PM

## 2012-04-16 NOTE — Progress Notes (Addendum)
1 Day Post-Op Procedure(s) (LRB): ASCENDING AORTIC ROOT REPLACEMENT (N/A) TRANSESOPHAGEAL ECHOCARDIOGRAM (TEE) (N/A) Subjective:   No complaints  Objective: Vital signs in last 24 hours: Temp:  [96.1 F (35.6 C)-99.3 F (37.4 C)] 99.3 F (37.4 C) (02/14 0745) Pulse Rate:  [56-123] 90 (02/14 0745) Cardiac Rhythm:  [-] A-V Sequential paced (02/14 0745)  Sinus with heart block I think. He is tracking atrium at rate 100. ECG read as A-fib. Resp:  [11-27] 15 (02/14 0745) BP: (76-173)/(39-139) 95/53 mmHg (02/14 0745) SpO2:  [90 %-100 %] 100 % (02/14 0745) FiO2 (%):  [40 %-50 %] 40 % (02/14 0150) Weight:  [107.7 kg (237 lb 7 oz)-112.6 kg (248 lb 3.8 oz)] 112.6 kg (248 lb 3.8 oz) (02/14 0500)  Hemodynamic parameters for last 24 hours: PAP: (20-33)/(8-23) 30/21 mmHg CO:  [3.3 L/min-6 L/min] 6 L/min CI:  [1.5 L/min/m2-2.7 L/min/m2] 2.7 L/min/m2  Intake/Output from previous day: 02/13 0701 - 02/14 0700 In: 8489.4 [I.V.:4589.9; Blood:2299.5; IV Piggyback:1600] Out: 4766 [Urine:1495; Stool:1; Blood:3000; Chest Tube:270] Intake/Output this shift:    General appearance: alert and cooperative Neurologic: intact Heart: regular rate and rhythm, S1, S2 normal, no murmur, click, rub or gallop Lungs: clear to auscultation bilaterally Extremities: edema moderate. Right hand warm with palpable radial pulse. Wound: dressing dry   Lab Results:  Recent Labs  04/15/12 2045 04/15/12 2054 04/16/12 0410  WBC 14.1*  --  13.3*  HGB 9.1* 8.8* 7.9*  HCT 26.9* 26.0* 23.2*  PLT 122*  --  104*   BMET:  Recent Labs  04/15/12 0608  04/15/12 2054 04/16/12 0410  NA 140  < > 143 143  K 3.8  < > 3.5 3.8  CL 103  --   --  109  CO2  --   --   --  23  GLUCOSE 176*  < > 142* 160*  BUN 20  --   --  16  CREATININE 0.90  --   --  0.85  CALCIUM  --   --   --  8.2*  < > = values in this interval not displayed.  PT/INR:  Recent Labs  04/15/12 2045  LABPROT 12.5  INR 0.94   ABG    Component  Value Date/Time   PHART 7.320* 04/16/2012 0400   HCO3 22.3 04/16/2012 0400   TCO2 24 04/16/2012 0400   ACIDBASEDEF 4.0* 04/16/2012 0400   O2SAT 97.0 04/16/2012 0400   CBG (last 3)   Recent Labs  04/16/12 0505 04/16/12 0602 04/16/12 0703  GLUCAP 132* 130* 103*   CXR:  Mild left base atelectasis  Assessment/Plan: S/P Procedure(s) (LRB): ASCENDING AORTIC ROOT REPLACEMENT (N/A) TRANSESOPHAGEAL ECHOCARDIOGRAM (TEE) (N/A) Mobilize Diuresis Diabetes control d/c tubes/lines Continue foley due to patient in ICU and urinary output monitoring See progression orders Acute blood loss anemia: Will transfuse 1 unit prbc's today.   LOS: 1 day    Eric Rivers K 04/16/2012

## 2012-04-16 NOTE — Progress Notes (Signed)
Pt extubated to 4 L [place] per rapid wean protocol, pt tol well. Vital signs stable. No stridor, no distress noted at this time. NIF -35, VC .8 L. Will cont to monitor

## 2012-04-16 NOTE — Op Note (Signed)
NAMEMarland Kitchen  ANUAR, WALGREN NO.:  [id number]  MEDICAL RECORD NO.:  [id number]  LOCATION:  2307                         FACILITY:  [facility]  PHYSICIAN:  Evelene Croon, M.D.     DATE OF BIRTH:  09/06/1930  DATE OF PROCEDURE:  04/15/2012 DATE OF DISCHARGE:                              OPERATIVE REPORT   PREOPERATIVE DIAGNOSIS:  Acute type A aortic dissection.  POSTOPERATIVE DIAGNOSIS:  Acute type A aortic dissection.  OPERATIVE PROCEDURE:  Median sternotomy, extracorporeal circulation via the right axillary artery and right atrium, resection and grafting of the ascending aorta using a 30-mm Hemashield supracoronary tube graft under deep hypothermic circulatory arrest.  ATTENDING SURGEON:  Evelene Croon, MD  ASSISTANT:  Rowe Clack, PAC, and Airen Barrett PAC  ANESTHESIA:  General endotracheal.  CLINICAL HISTORY:  This patient is a 79 year old gentleman with history of hypertension and diabetes as well as atrial fibrillation, on Coumadin therapy, who developed acute onset of chest pain and headache around 3 o'clock in the morning after he woke up to go the bathroom.  His symptoms did not improve and he came to the emergency room where CT scan of the chest was done to rule out pulmonary embolism.  This did not show any pulmonary embolism, but did show evidence of an acute type A dissection.  There was essentially no contrast in the arterial system, but it was possible to see a dissection plane due to the calcium in the wall of the aorta.  There was a small pericardial effusion.  He had calcification of his coronary arteries diffusely.  When I saw the patient initially, he was not sure he wants to undergo surgery and therefore there was a delay while he thought about this and talked with his friend.  His INR was 2.56 and he was given fresh frozen plasma and vitamin K to correct that.  I initially saw him at about 8 o'clock in the morning and he finally decided around  12:30 in the afternoon that he wanted to undergo surgery.  The team was mobilized and he was taken to the operating room in hemodynamically stable condition.  OPERATIVE PROCEDURE:  The patient was taken to the operating room and placed on the table in supine position.  After induction of general endotracheal anesthesia, a Foley catheter was placed in the bladder using sterile technique.  Then, the chest, abdomen, and both lower extremities were prepped and draped in usual sterile manner.  A TEE was performed and showed type A dissection that appeared to start around the sino-tubular junction.  The aortic valve leaflets had some calcification, but good movement.  The patient did have a history of mild aortic stenosis by echocardiogram in 2011.  There was no aortic insufficiency.  There was no significant mitral regurgitation.  Left and right heart function appeared well preserved.  Then, the chest was opened through a median sternotomy incision and the pericardium opened in the midline.  There was a moderate-sized bloody pericardial effusion.  There did not appear to be any active ongoing bleeding.  Examination of the aorta showed that it was ecchymotic throughout.  There was a moderate aneurysmal enlargement of the ascending aorta,  beginning at about the level of the sino-tubular junction.  Then, the right axillary artery was exposed through a transverse incision below the right clavicle.  The axillary artery was a large vessel.  There was slight ecchymosis in the wall of the artery posteriorly.  The artery was mobilized, taking care to avoid the brachial plexus.  Vessel loops were placed around the vessel proximally and distally.  Then, the patient was heparinized and when an adequate ACT was obtained, the axillary artery was clamped proximally and distally.  It was opened longitudinally along the anterior wall. Examination of the anterior vessel showed there was a dissection  plane here posteriorly.  It appeared that we were in the true lumen, opening the vessel, but there was a false lumen located posteriorly.  I felt this would be the best location for insertion of a graft to put the patient on pump because I was concerned about the aortic dissection distally and how the flow was to the femoral arteries.  Then, an 8-mm Hemashield tube graft was sewn end-to-side to the axillary artery using continuous 5-0 Prolene suture.  This anastomosis was coated with CoSeal for hemostasis.  The cross clamps were removed from the axillary artery. The anastomosis appeared hemostatic.  There appeared to be excellent flow.  There was pulsatile through the graft.  It was connected to the arterial end of the bypass circuit and the mean arterial pressure that was measured was 80 mmHg which is actually higher than the left radial A- line pressure.  Then, attention was returned to the chest.  The right atrium was cannulated using a large venous cannula for venous outflow. A left ventricular vent was placed in the right superior pulmonary vein. A retrograde cardioplegic cannula was inserted through a pursestring suture in the right atrium and advanced in the coronary sinus.  The patient was then placed on cardiopulmonary bypass and cooled to a temperature of 18 degrees centigrade.  While the patient was cooling, the remainder of the ascending aorta was mobilized.  The aorta to return to more normal size just at the level of the innominate artery.  After about 40 minutes of cooling, the venous return temperature was 17 degrees centigrade and the arterial inflow temperature was about 14 degrees centigrade.  Bladder and rectal temperatures were still around 25, but the cerebral oximetry was at 80% and the BIS was at 5.  I felt that we had been cooling sufficiently long to do circulatory arrest. The patient was then given etomidate and Solu-Medrol by Anesthesiology. The head was  packed in ice.  Then, the patient was placed in a steep Trendelenburg position.  Cardiopulmonary bypass was stopped and the blood volume emptied back into the pump.  I did not give antegrade cerebral cardioplegia because I was concerned about placing a clamp across the innominate artery as a likelihood that there was a dissection in the vessel.  Then, the distal ascending aorta was transected just proximal to the innominate artery.  There was an obvious dissection plane and the aortic wall was fairly thin.  Viewing the interior of the aortic arch, there does not appear to be any reentry tears.  As far down as I could see, I did not see any reentry tears.  Then, a 30-mm Hemashield tube graft with 10-mm side-arm was cut to the appropriate length, and the dissected walls of the proximal aortic arch were reapproximated using a sheet of Felt and BioGlue placed in between the aortic layers to reapproximate  them.  Then, the Hemashield tube graft was sewn to the end of the aorta in an end-to-end manner using continuous 3-0 Prolene suture with a Felt strip to reinforce the anastomosis.  This anastomosis was then coated with CoSeal for hemostasis.  We did give cold blood retrograde cardioplegia at the start of the circulatory arrest and then throughout the remainder of the case at 20 minutes intervals to maintain myocardial temperature around 10 degrees centigrade.  A temperature probe was placed in septum, insulating pad in the pericardium.  After the distal anastomosis was performed, the arterial end of the bypass circuit was connected to the 10-mm side arm graft.  Circulation was slowly re-established and the crossclamp placed just proximal to the 10-mm side on.  Full cardiopulmonary bypass was reinstituted.  The anastomosis appeared relatively hemostatic at this point.  Then, the ascending aorta was mobilized from the right pulmonary artery posteriorly and the main pulmonary artery laterally.   It was opened longitudinally in the midline and the aortic dissection appeared to start at about the sinotubular junction or just slightly distal to that.  It did extend down into the noncoronary sinus slightly, but this was easily repaired by reapproximating the layers of the aorta with a sheet of Felt and the BioGlue.  The aorta was transected at the sino-tubular junction.  The right and left coronary ostia were identified.  They were calcified, but not obstructed.  Then, the Hemashield tube graft was cut to the appropriate length.  It was anastomosed end-to-end to the sino-tubular junction using continuous 3-0 Prolene suture with a Felt strip to reinforce this anastomosis.  The anastomosis was coated with CoSeal. Then, the patient rewarmed at 32 degrees centigrade.  The total circulatory arrest time for this case was 26 minutes.  While we were rewarming, a de-airing needle was placed into the ascending aortic graft.  The left side of the heart was then de-aired.  We did use CO2 insufflation in the pericardium throughout the case to minimize intracardiac air.  After de-airing maneuvers, the head was placed in Trendelenburg position.  Then, the crossclamp removed.  There was return of sinus rhythm.  The proximal and distal anastomoses appeared intact, but there was bleeding from all the needle holes in the graft and the aorta.  Two temporary right ventricular and right atrial pacing wires were placed and brought out through the skin.  When the patient rewarmed to 37 degrees centigrade, he was weaned from cardiopulmonary bypass on no inotropic agents.  Total bypass time was 160 minutes.  Cardiac function appeared excellent.  TEE showed a normal functioning aortic valve.  There was no regurgitation or stenosis.  There was no significant mitral regurgitation.  Right and left heart function appeared well preserved.  Then, protamine was given and the venous cannula removed.  The aortic side  arm graft was ligated with a heavy silk tie and then divided and suture ligated with a 3-0 Prolene pledgeted horizontal mattress suture.  The patient remained coagulopathic and was given platelets, fresh frozen plasma, and recombinant factor VII to obtain adequate hemostasis.  After obtaining hemostasis, 2 chest tubes were placed with 2 in the posterior pericardium, 1 in the anterior mediastinum.  The sternum was then closed with double #6 stainless steel wires.  The fascia was closed with continuous #1 Vicryl suture.  Subcutaneous tissue was closed with continuous 2-0 Vicryl and the skin with a 3-0 Vicryl subcuticular closure.  The graft on the right axillary artery was tied  with a heavy silk tie and then divided and suture ligated with a 3-0 Prolene horizontal mattress suture.  This incision was closed in layers in a similar manner.  The sponge, needle, and instrument counts were correct according to the scrub nurse.  Dry sterile dressing was applied over the incisions around chest tubes which were hooked to Pleur-Evac suction. The patient remained hemodynamically stable and transported to the SICU in guarded, but stable condition.     Evelene Croon, M.D.     BB/MEDQ  D:  04/16/2012  T:  04/16/2012  Job:  [id number]

## 2012-04-17 ENCOUNTER — Inpatient Hospital Stay (HOSPITAL_COMMUNITY): Payer: Medicare Other

## 2012-04-17 DIAGNOSIS — E1165 Type 2 diabetes mellitus with hyperglycemia: Secondary | ICD-10-CM

## 2012-04-17 DIAGNOSIS — IMO0001 Reserved for inherently not codable concepts without codable children: Secondary | ICD-10-CM

## 2012-04-17 LAB — GLUCOSE, CAPILLARY
Glucose-Capillary: 50 mg/dL — ABNORMAL LOW (ref 70–99)
Glucose-Capillary: 119 mg/dL — ABNORMAL HIGH (ref 70–99)
Glucose-Capillary: 173 mg/dL — ABNORMAL HIGH (ref 70–99)
Glucose-Capillary: 105 mg/dL — ABNORMAL HIGH (ref 70–99)

## 2012-04-17 LAB — BASIC METABOLIC PANEL
BUN: 34 mg/dL — ABNORMAL HIGH (ref 6–23)
CO2: 26 mEq/L (ref 19–32)
Chloride: 104 mEq/L (ref 96–112)
Creatinine, Ser: 1.52 mg/dL — ABNORMAL HIGH (ref 0.50–1.35)

## 2012-04-17 LAB — CBC
HCT: 21.8 % — ABNORMAL LOW (ref 39.0–52.0)
Hemoglobin: 7.6 g/dL — ABNORMAL LOW (ref 13.0–17.0)
MCHC: 34.9 g/dL (ref 30.0–36.0)
MCV: 93.6 fL (ref 78.0–100.0)
RDW: 16.4 % — ABNORMAL HIGH (ref 11.5–15.5)
WBC: 14.9 10*3/uL — ABNORMAL HIGH (ref 4.0–10.5)

## 2012-04-17 NOTE — Progress Notes (Signed)
                   [address]            [place] [address]          [phone]     2 Days Post-Op Procedure(s) (LRB): ASCENDING AORTIC ROOT REPLACEMENT (N/A) TRANSESOPHAGEAL ECHOCARDIOGRAM (TEE) (N/A)  Total Length of Stay:  LOS: 2 days  BP 104/62  Pulse 90  Temp(Src) 98.2 F (36.8 C) (Oral)  Resp 17  Ht 5\' 8"  (1.727 m)  Wt 250 lb 14.1 oz (113.8 kg)  BMI 38.16 kg/m2  SpO2 98%  .Intake/Output     02/15 0701 - 02/16 0700   P.O. 360   I.V. (mL/kg) 240 (2.1)   Blood    IV Piggyback 50   Total Intake(mL/kg) 650 (5.7)   Urine (mL/kg/hr) 735 (0.5)   Total Output 735   Net -85         . sodium chloride Stopped (04/16/12 0900)  . sodium chloride 20 mL (04/15/12 2214)  . dexmedetomidine Stopped (04/16/12 0000)  . lactated ringers 20 mL/hr at 04/17/12 0800     Lab Results  Component Value Date   WBC 14.9* 04/17/2012   HGB 7.6* 04/17/2012   HCT 21.8* 04/17/2012   PLT 85* 04/17/2012   GLUCOSE 138* 04/17/2012   CHOL 110 08/28/2011   TRIG 108.0 08/28/2011   HDL 40.70 08/28/2011   LDLCALC 48 08/28/2011   ALT 19 02/24/2012   AST 27 02/24/2012   NA 140 04/17/2012   K 3.9 04/17/2012   CL 104 04/17/2012   CREATININE 1.52* 04/17/2012   BUN 34* 04/17/2012   CO2 26 04/17/2012   TSH 1.47 08/21/2010   PSA 2.19 12/06/2007   INR 0.94 04/15/2012   HGBA1C 6.1 02/24/2012   MICROALBUR 2.3* 06/03/2006   Stable day, worked with pt today Awake and neuro intact 735 uop so far today  Delight Ovens MD  Beeper [phone] Office [phone] 04/17/2012 7:22 PM

## 2012-04-17 NOTE — Evaluation (Signed)
Physical Therapy Evaluation Patient Details Name: Eric Rivers MRN: [id number] DOB: Mar 05, 1930 Today's Date: 04/17/2012 Time: 4098-1191 PT Time Calculation (min): 17 min  PT Assessment / Plan / Recommendation Clinical Impression  Pt admitted with ascending aorta dissection s/p repair who is beginning to progress with mobility but requested return to bed end of session due to having already been up in the chair this morning. Pt does not have assist at home and is agreeable to ST-SNF until he can care for himself again. Will follow acutely to maximize transfers, gait and functional mobility to return pt to PLOF. Recommend OOB daily with nursing.    PT Assessment  Patient needs continued PT services    Follow Up Recommendations  SNF;Supervision for mobility/OOB    Does the patient have the potential to tolerate intense rehabilitation      Barriers to Discharge Decreased caregiver support      Equipment Recommendations  Rolling walker with 5" wheels    Recommendations for Other Services OT consult   Frequency Min 3X/week    Precautions / Restrictions Precautions Precautions: Fall Restrictions Weight Bearing Restrictions: No (sternal precautions)   Pertinent Vitals/Pain sats 100% on 2L at rest 91% on 2L with activity, HR 87 BP 121/55      Mobility  Bed Mobility Bed Mobility: Rolling Right;Right Sidelying to Sit;Sitting - Scoot to Edge of Bed;Sit to Sidelying Right Rolling Right: 4: Min assist;With rail Right Sidelying to Sit: 4: Min assist;HOB flat;With rails Sitting - Scoot to Edge of Bed: 5: Supervision Sit to Sidelying Right: 3: Mod assist;HOB flat Details for Bed Mobility Assistance: cueing for sequence and assist to cross midline to reach for rail and for cueing to elevate trunk from surface. With return to supine required increased assist to elevate legs onto surface Transfers Transfers: Sit to Stand;Stand to Sit Sit to Stand: 4: Min assist;From bed Stand to  Sit: 4: Min guard;To bed Details for Transfer Assistance: cueing for hand placement and sequence Ambulation/Gait Ambulation/Gait Assistance: 4: Min assist Ambulation Distance (Feet): 30 Feet Assistive device: Rolling walker Ambulation/Gait Assistance Details: cueing for posture and position in RW, pt denied increased distance due to low back pain which is normal for pt Gait Pattern: Step-through pattern;Decreased stride length Gait velocity: decreased Stairs: No    Exercises     PT Diagnosis: Difficulty walking;Acute pain  PT Problem List: Decreased activity tolerance;Decreased mobility;Decreased knowledge of use of DME;Pain PT Treatment Interventions: Gait training;DME instruction;Functional mobility training;Therapeutic activities;Therapeutic exercise;Patient/family education   PT Goals Acute Rehab PT Goals PT Goal Formulation: With patient Time For Goal Achievement: 05/01/12 Potential to Achieve Goals: Good Pt will go Supine/Side to Sit: with supervision;with HOB 0 degrees PT Goal: Supine/Side to Sit - Progress: Goal set today Pt will go Sit to Supine/Side: with supervision;with HOB 0 degrees PT Goal: Sit to Supine/Side - Progress: Goal set today Pt will go Sit to Stand: with modified independence PT Goal: Sit to Stand - Progress: Goal set today Pt will go Stand to Sit: with modified independence PT Goal: Stand to Sit - Progress: Goal set today Pt will Ambulate: >150 feet;with supervision;with least restrictive assistive device PT Goal: Ambulate - Progress: Goal set today  Visit Information  Last PT Received On: 04/17/12 Assistance Needed: +1    Subjective Data  Subjective: I want to get back to my dream Patient Stated Goal: return to traveling- recently went to [place]   Prior Functioning  Home Living Lives With: Alone Type of Home: House Home  Access: Ramped entrance Home Layout: One level Bathroom Toilet: Standard Home Adaptive Equipment: None Prior Function Level  of Independence: Independent Able to Take Stairs?: Yes Driving: Yes Vocation: Retired Comments: Pt has a Advertising copywriter for heavier cleaning but no additional assist at home Communication Communication: No difficulties    Cognition  Cognition Overall Cognitive Status: Appears within functional limits for tasks assessed/performed Arousal/Alertness: Awake/alert Orientation Level: Appears intact for tasks assessed Behavior During Session: [facility] for tasks performed    Extremity/Trunk Assessment Right Upper Extremity Assessment RUE ROM/Strength/Tone: [facility] for tasks assessed Left Upper Extremity Assessment LUE ROM/Strength/Tone: [facility] for tasks assessed Right Lower Extremity Assessment RLE ROM/Strength/Tone: [facility] for tasks assessed Left Lower Extremity Assessment LLE ROM/Strength/Tone: [facility] for tasks assessed Trunk Assessment Trunk Assessment: Normal   Balance    End of Session PT - End of Session Equipment Utilized During Treatment: Gait belt Activity Tolerance: Patient tolerated treatment well Patient left: in bed;with call bell/phone within reach Nurse Communication: Mobility status  GP     Delorse Lek 04/17/2012, 11:59 AM Delaney Meigs, PT [phone]

## 2012-04-17 NOTE — Progress Notes (Signed)
Patient ID: Eric Rivers, male   DOB: 08-08-1930, 79 y.o.   MRN: [id number] TCTS DAILY PROGRESS NOTE                   [address]            [place] [address]          [phone]      2 Days Post-Op Procedure(s) (LRB): ASCENDING AORTIC ROOT REPLACEMENT (N/A) TRANSESOPHAGEAL ECHOCARDIOGRAM (TEE) (N/A)  Total Length of Stay:  LOS: 2 days   Subjective: Neuro intact, not moving much, says has trouble with balance preop  Objective: Vital signs in last 24 hours: Temp:  [97.5 F (36.4 C)-98.7 F (37.1 C)] 97.5 F (36.4 C) (02/15 0739) Pulse Rate:  [91-102] 93 (02/15 0700) Cardiac Rhythm:  [-] A-V Sequential paced (02/15 0000) Resp:  [14-25] 22 (02/15 0700) BP: (87-128)/(41-75) 109/60 mmHg (02/15 0700) SpO2:  [94 %-100 %] 100 % (02/15 0700) Weight:  [250 lb 14.1 oz (113.8 kg)] 250 lb 14.1 oz (113.8 kg) (02/15 0500)  Filed Weights   04/15/12 2045 04/16/12 0500 04/17/12 0500  Weight: 237 lb 7 oz (107.7 kg) 248 lb 3.8 oz (112.6 kg) 250 lb 14.1 oz (113.8 kg)    Weight change: 13 lb 7.2 oz (6.1 kg)   Hemodynamic parameters for last 24 hours:    Intake/Output from previous day: 02/14 0701 - 02/15 0700 In: 2550 [P.O.:1330; I.V.:617.5; Blood:262.5; IV Piggyback:340] Out: 1120 [Urine:1120]  Intake/Output this shift:    Current Meds: Scheduled Meds: . acetaminophen  1,000 mg Oral Q6H   Or  . acetaminophen (TYLENOL) oral liquid 160 mg/5 mL  975 mg Per Tube Q6H  . allopurinol  300 mg Oral Daily  . aspirin EC  325 mg Oral Daily   Or  . aspirin  324 mg Per Tube Daily  . atorvastatin  20 mg Oral q1800  . bisacodyl  10 mg Oral Daily   Or  . bisacodyl  10 mg Rectal Daily  . cefUROXime (ZINACEF)  IV  1.5 g Intravenous Q12H  . docusate sodium  200 mg Oral Daily  . furosemide  80 mg Intravenous Q12H  . insulin aspart  0-24 Units Subcutaneous Q4H  . insulin detemir  20 Units Subcutaneous BID  . insulin regular  0-10 Units Intravenous TID WC  .  metoprolol tartrate  12.5 mg Oral BID   Or  . metoprolol tartrate  12.5 mg Per Tube BID  . pantoprazole  40 mg Oral Daily  . sodium chloride  3 mL Intravenous Q12H  . Tamsulosin HCl  0.4 mg Oral QHS   Continuous Infusions: . sodium chloride Stopped (04/16/12 0900)  . sodium chloride 20 mL (04/15/12 2214)  . dexmedetomidine Stopped (04/16/12 0000)  . lactated ringers 20 mL/hr at 04/17/12 0600  . nitroGLYCERIN Stopped (04/15/12 2100)  . phenylephrine (NEO-SYNEPHRINE) Adult infusion Stopped (04/16/12 0800)   PRN Meds:.sodium chloride, metoprolol, morphine injection, ondansetron (ZOFRAN) IV, oxyCODONE, sodium chloride  General appearance: alert and cooperative Neurologic: intact Heart: irregularly irregular rhythm Lungs: diminished breath sounds bibasilar Abdomen: soft, non-tender; bowel sounds normal; no masses,  no organomegaly Extremities: extremities normal, atraumatic, no cyanosis or edema and Homans sign is negative, no sign of DVT Wound: sternum stable  Lab Results: CBC: Recent Labs  04/16/12 1720 04/16/12 1728 04/17/12 0440  WBC 16.9*  --  14.9*  HGB 8.3* 8.5* 7.6*  HCT 24.2* 25.0* 21.8*  PLT 94*  --  85*  BMET:  Recent Labs  04/16/12 0410  04/16/12 1728 04/17/12 0440  NA 143  --  142 140  K 3.8  --  4.0 3.9  CL 109  --  108 104  CO2 23  --   --  26  GLUCOSE 160*  --  139* 138*  BUN 16  --  26* 34*  CREATININE 0.85  < > 1.50* 1.52*  CALCIUM 8.2*  --   --  8.2*  < > = values in this interval not displayed.  PT/INR:  Recent Labs  04/15/12 2045  LABPROT 12.5  INR 0.94   Radiology: Dg Chest Port 1 View  04/17/2012  *RADIOLOGY REPORT*  Clinical Data: Thoracic aneurysm repair  PORTABLE CHEST - 1 VIEW  Comparison: Yesterday  Findings: Severe cardiomegaly is stable.  Swan-Ganz removed with the introducer left in place.  Bibasilar atelectasis and small bilateral pleural effusions have developed.  No pneumothorax.  No definite edema.  IMPRESSION: Swan-Ganz  removed.  Increased bibasilar atelectasis and small pleural effusions.   Original Report Authenticated By: Jolaine Click, M.D.    Dg Chest Portable 1 View In Am  04/16/2012  *RADIOLOGY REPORT*  Clinical Data: Postop CABG  PORTABLE CHEST - 1 VIEW  Comparison: 04/15/2012; 10/02/2011; chest CT - 04/15/2012  Findings: Grossly unchanged enlarged cardiac silhouette and mediastinal contours.  Interval extubation and removal of enteric tube.  Right jugular approach PA catheter tip again projects over expected location of the main pulmonary artery outflow tract. Unchanged positioning of mediastinal drain. Grossly unchanged left basilar heterogeneous opacities, favored to represent atelectasis. No evidence of pulmonary edema or definite pleural effusion.  No pneumothorax.  Unchanged bones.  IMPRESSION: 1.  Interval extubation and removal of enteric tube.  Otherwise, stable positioning of remaining support apparatus.  No pneumothorax. 2.  No acute cardiopulmonary disease.  3.  Unchanged enlarged cardiac silhouette and mediastinal contours compatible with known type A aortic dissection.   Original Report Authenticated By: Tacey Ruiz, MD    Dg Chest Portable 1 View  04/15/2012  *RADIOLOGY REPORT*  Clinical Data: Postoperative radiograph; evaluate endotracheal tube and central lines.  PORTABLE CHEST - 1 VIEW  Comparison: Chest radiograph and CTA of the chest performed earlier today at 05:46 a.m. and 07:00 a.m.  Findings: The patient's endotracheal tube is seen ending 4 cm above the carina.  The enteric tube is noted extending below the diaphragm.  A right IJ Swan-Ganz catheter is noted ending overlying the pulmonary outflow tract.  A mediastinal drain is noted.  The lungs are relatively well expanded.  Mild left basilar opacity likely reflects atelectasis.  Mild right-sided pleural thickening may reflect a small right pleural effusion.  No pneumothorax is seen.  The cardiomediastinal silhouette is mildly enlarged; the  patient is now status post median sternotomy.  No acute osseous abnormalities are seen.  IMPRESSION:  1.  Endotracheal tube seen ending 4 cm above the carina. 2.  Right IJ Swan-Ganz catheter noted ending overlying the pulmonary outflow tract. 3.  Mild left basilar airspace opacity likely reflects atelectasis; suggestion of small right pleural effusion. 4.  Mild cardiomegaly.   Original Report Authenticated By: Tonia Ghent, M.D.      Assessment/Plan: S/P Procedure(s) (LRB): ASCENDING AORTIC ROOT REPLACEMENT (N/A) TRANSESOPHAGEAL ECHOCARDIOGRAM (TEE) (N/A) Mobilize Diuresis PT consult Mild acute renal insufficiency with cr 0.85 to 1.52 will level foley 24 hours more to monitor uop and because history or urinary retention preop, on flomax but pateient says does not work  well    Mehr Depaoli B 04/17/2012 8:35 AM

## 2012-04-17 NOTE — Progress Notes (Signed)
Hypoglycemic Event  CBG: 55  Treatment: 15 GM carbohydrate snack  Symptoms: None  Follow-up CBG: Time: 0440 CBG Result:117  Possible Reasons for Event: inadequate meal intake  Comments/MD notified: will address on AM rounds 2 episodes of hypoglycemia    Durwin Nora, Gayleen Orem  Remember to initiate Hypoglycemia Order Set & complete

## 2012-04-18 ENCOUNTER — Inpatient Hospital Stay (HOSPITAL_COMMUNITY): Payer: Medicare Other

## 2012-04-18 LAB — CBC
HCT: 19.9 % — ABNORMAL LOW (ref 39.0–52.0)
Hemoglobin: 6.8 g/dL — CL (ref 13.0–17.0)
MCH: 32.1 pg (ref 26.0–34.0)
MCHC: 34.2 g/dL (ref 30.0–36.0)
MCV: 93.9 fL (ref 78.0–100.0)
Platelets: 74 10*3/uL — ABNORMAL LOW (ref 150–400)
RBC: 2.12 MIL/uL — ABNORMAL LOW (ref 4.22–5.81)
RDW: 16 % — ABNORMAL HIGH (ref 11.5–15.5)
WBC: 10.9 10*3/uL — ABNORMAL HIGH (ref 4.0–10.5)

## 2012-04-18 LAB — GLUCOSE, CAPILLARY
Glucose-Capillary: 112 mg/dL — ABNORMAL HIGH (ref 70–99)
Glucose-Capillary: 136 mg/dL — ABNORMAL HIGH (ref 70–99)
Glucose-Capillary: 137 mg/dL — ABNORMAL HIGH (ref 70–99)
Glucose-Capillary: 150 mg/dL — ABNORMAL HIGH (ref 70–99)
Glucose-Capillary: 61 mg/dL — ABNORMAL LOW (ref 70–99)
Glucose-Capillary: 81 mg/dL (ref 70–99)

## 2012-04-18 LAB — BASIC METABOLIC PANEL
BUN: 40 mg/dL — ABNORMAL HIGH (ref 6–23)
CO2: 28 mEq/L (ref 19–32)
Calcium: 8 mg/dL — ABNORMAL LOW (ref 8.4–10.5)
Chloride: 100 mEq/L (ref 96–112)
Creatinine, Ser: 1.49 mg/dL — ABNORMAL HIGH (ref 0.50–1.35)
GFR calc Af Amer: 50 mL/min — ABNORMAL LOW (ref >90)
GFR calc non Af Amer: 43 mL/min — ABNORMAL LOW (ref >90)
Glucose, Bld: 131 mg/dL — ABNORMAL HIGH (ref 70–99)
Potassium: 3.6 mEq/L (ref 3.5–5.1)
Sodium: 133 mEq/L — ABNORMAL LOW (ref 135–145)

## 2012-04-18 LAB — PREPARE RBC (CROSSMATCH)

## 2012-04-18 MED ORDER — INSULIN DETEMIR 100 UNIT/ML ~~LOC~~ SOLN: 20.0000 [IU] | Freq: Every day | SUBCUTANEOUS | Status: DC

## 2012-04-18 MED ORDER — INSULIN DETEMIR 100 UNIT/ML ~~LOC~~ SOLN
20.0000 [IU] | Freq: Every day | SUBCUTANEOUS | Status: DC
  Administered 2012-04-19: 20 [IU] via SUBCUTANEOUS

## 2012-04-18 NOTE — Progress Notes (Addendum)
Hypoglycemic Event  CBG: 50  Treatment: 15 GM carbohydrate snack  Symptoms: None  Follow-up CBG: Time:  CBG Result: 49  1 cup orange juice given   Follow-up CBG: Time: 09:22         CBG Result: 150  Possible Reasons for Event: Inadequate Meal Intake  Comments/MD notified: Dr. Alveta Heimlich, Marlane Mingle  Remember to initiate Hypoglycemia Order Set & complete

## 2012-04-18 NOTE — Progress Notes (Addendum)
Foley d/c at 15:45 per MD order. Pt reports that since start of radiation for prostate ca, pt has experienced voiding small amounts frequently at home.   Holly Bodily

## 2012-04-18 NOTE — Progress Notes (Signed)
Patient ID: Eric Rivers, male   DOB: 31-Oct-1930, 79 y.o.   MRN: [id number]                   [address]            [place] [address]          [phone]     3 Days Post-Op Procedure(s) (LRB): ASCENDING AORTIC ROOT REPLACEMENT (N/A) TRANSESOPHAGEAL ECHOCARDIOGRAM (TEE) (N/A)  Total Length of Stay:  LOS: 3 days  BP 109/65  Pulse 61  Temp(Src) 98 F (36.7 C) (Oral)  Resp 18  Ht 5\' 8"  (1.727 m)  Wt 251 lb 8.7 oz (114.1 kg)  BMI 38.26 kg/m2  SpO2 91%  .Intake/Output     02/16 0701 - 02/17 0700   P.O. 480   I.V. (mL/kg) 220 (1.9)   Blood 350   IV Piggyback    Total Intake(mL/kg) 1050 (9.2)   Urine (mL/kg/hr) 1825 (1.3)   Total Output 1825   Net -775       Urine Occurrence 2 x   Stool Occurrence 5 x     . sodium chloride Stopped (04/16/12 0900)  . sodium chloride 20 mL (04/15/12 2214)  . lactated ringers 20 mL/hr at 04/17/12 0800     Lab Results  Component Value Date   WBC 10.9* 04/18/2012   HGB 6.8* 04/18/2012   HCT 19.9* 04/18/2012   PLT 74* 04/18/2012   GLUCOSE 131* 04/18/2012   CHOL 110 08/28/2011   TRIG 108.0 08/28/2011   HDL 40.70 08/28/2011   LDLCALC 48 08/28/2011   ALT 19 02/24/2012   AST 27 02/24/2012   NA 133* 04/18/2012   K 3.6 04/18/2012   CL 100 04/18/2012   CREATININE 1.49* 04/18/2012   BUN 40* 04/18/2012   CO2 28 04/18/2012   TSH 1.47 08/21/2010   PSA 2.19 12/06/2007   INR 0.94 04/15/2012   HGBA1C 6.1 02/24/2012   MICROALBUR 2.3* 06/03/2006   Foley out , urinary incontinence since prostate radiation, given blood today, follow up CBC in am  Delight Ovens MD  Beeper [phone] Office [phone] 04/18/2012 7:25 PM

## 2012-04-18 NOTE — Significant Event (Signed)
CRITICAL VALUE ALERT  Critical value received:  Hgb 6.8  Date of notification:  04/18/2012   Time of notification:  0358  Critical value read back:yes  Nurse who received alert:  Jola Schmidt   MD notified (1st page):  Dr Tyrone Sage  Time of first page:  4:11 AM   MD notified (2nd page):  Time of second page:  Responding MD:    Time MD responded:

## 2012-04-18 NOTE — Progress Notes (Addendum)
Patient ID: Eric Rivers, male   DOB: Dec 29, 1930, 79 y.o.   MRN: [id number] TCTS DAILY PROGRESS NOTE                   [address]            [place] [address]          [phone]      3 Days Post-Op Procedure(s) (LRB): ASCENDING AORTIC ROOT REPLACEMENT (N/A) TRANSESOPHAGEAL ECHOCARDIOGRAM (TEE) (N/A)  Total Length of Stay:  LOS: 3 days   Subjective: Neuro intact, not moving much,no abdominal pain, no BM yet   Objective: Vital signs in last 24 hours: Temp:  [97.8 F (36.6 C)-98.6 F (37 C)] 97.9 F (36.6 C) (02/16 0915) Pulse Rate:  [37-92] 92 (02/16 0927) Cardiac Rhythm:  [-] Atrial fibrillation (02/16 0800) Resp:  [13-28] 23 (02/16 0927) BP: (84-122)/(45-73) 108/70 mmHg (02/16 0927) SpO2:  [94 %-100 %] 99 % (02/16 0927) Weight:  [251 lb 8.7 oz (114.1 kg)] 251 lb 8.7 oz (114.1 kg) (02/16 0500)  Filed Weights   04/16/12 0500 04/17/12 0500 04/18/12 0500  Weight: 248 lb 3.8 oz (112.6 kg) 250 lb 14.1 oz (113.8 kg) 251 lb 8.7 oz (114.1 kg)    Weight change: 10.6 oz (0.3 kg)   Hemodynamic parameters for last 24 hours:    Intake/Output from previous day: 02/15 0701 - 02/16 0700 In: 1550 [P.O.:1020; I.V.:480; IV Piggyback:50] Out: 2010 [Urine:2010]  Intake/Output this shift: Total I/O In: 370 [I.V.:20; Blood:350] Out: 115 [Urine:115]  Current Meds: Scheduled Meds: . acetaminophen  1,000 mg Oral Q6H   Or  . acetaminophen (TYLENOL) oral liquid 160 mg/5 mL  975 mg Per Tube Q6H  . allopurinol  300 mg Oral Daily  . aspirin EC  325 mg Oral Daily   Or  . aspirin  324 mg Per Tube Daily  . atorvastatin  20 mg Oral q1800  . bisacodyl  10 mg Oral Daily   Or  . bisacodyl  10 mg Rectal Daily  . docusate sodium  200 mg Oral Daily  . furosemide  80 mg Intravenous Q12H  . insulin aspart  0-24 Units Subcutaneous Q4H  . insulin detemir  20 Units Subcutaneous BID  . insulin regular  0-10 Units Intravenous TID WC  . metoprolol tartrate  12.5 mg Oral  BID   Or  . metoprolol tartrate  12.5 mg Per Tube BID  . pantoprazole  40 mg Oral Daily  . sodium chloride  3 mL Intravenous Q12H  . Tamsulosin HCl  0.4 mg Oral QHS   Continuous Infusions: . sodium chloride Stopped (04/16/12 0900)  . sodium chloride 20 mL (04/15/12 2214)  . dexmedetomidine Stopped (04/16/12 0000)  . lactated ringers 20 mL/hr at 04/17/12 0800   PRN Meds:.sodium chloride, metoprolol, morphine injection, ondansetron (ZOFRAN) IV, oxyCODONE, sodium chloride  General appearance: alert and cooperative Neurologic: intact Heart: irregularly irregular rhythm Lungs: diminished breath sounds bibasilar Abdomen: soft, non-tender; bowel sounds normal; no masses,  no organomegaly Extremities: extremities normal, atraumatic, no cyanosis or edema and Homans sign is negative, no sign of DVT Wound: sternum stable Rt axillary artery site bruised but intact  Lab Results: CBC:  Recent Labs  04/17/12 0440 04/18/12 0339  WBC 14.9* 10.9*  HGB 7.6* 6.8*  HCT 21.8* 19.9*  PLT 85* 74*   BMET:   Recent Labs  04/17/12 0440 04/18/12 0339  NA 140 133*  K 3.9 3.6  CL 104 100  CO2 26 28  GLUCOSE 138* 131*  BUN 34* 40*  CREATININE 1.52* 1.49*  CALCIUM 8.2* 8.0*    PT/INR:   Recent Labs  04/15/12 2045  LABPROT 12.5  INR 0.94   Radiology: Dg Chest Port 1 View  04/17/2012  *RADIOLOGY REPORT*  Clinical Data: Thoracic aneurysm repair  PORTABLE CHEST - 1 VIEW  Comparison: Yesterday  Findings: Severe cardiomegaly is stable.  Swan-Ganz removed with the introducer left in place.  Bibasilar atelectasis and small bilateral pleural effusions have developed.  No pneumothorax.  No definite edema.  IMPRESSION: Swan-Ganz removed.  Increased bibasilar atelectasis and small pleural effusions.   Original Report Authenticated By: Jolaine Click, M.D.      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

## 2012-04-18 NOTE — Significant Event (Signed)
Hypoglycemic Event  CBG: 61  Treatment: 15 GM carbohydrate snack  Symptoms: None  Follow-up CBG: Time: 2359 CBG Result: 112  Possible Reasons for Event: Inadequate meal intake  Comments/MD notified:    Jola Schmidt  Remember to initiate Hypoglycemia Order Set & complete

## 2012-04-19 ENCOUNTER — Ambulatory Visit: Payer: Medicare Other

## 2012-04-19 ENCOUNTER — Inpatient Hospital Stay (HOSPITAL_COMMUNITY): Payer: Medicare Other

## 2012-04-19 ENCOUNTER — Encounter (HOSPITAL_COMMUNITY): Payer: Self-pay | Admitting: Surgery

## 2012-04-19 LAB — PREPARE RBC (CROSSMATCH)

## 2012-04-19 LAB — GLUCOSE, CAPILLARY: Glucose-Capillary: 126 mg/dL — ABNORMAL HIGH (ref 70–99)

## 2012-04-19 LAB — CBC
HCT: 22.5 % — ABNORMAL LOW (ref 39.0–52.0)
Hemoglobin: 7.5 g/dL — ABNORMAL LOW (ref 13.0–17.0)
MCH: 31 pg (ref 26.0–34.0)
MCHC: 33.3 g/dL (ref 30.0–36.0)
MCV: 93 fL (ref 78.0–100.0)
Platelets: 88 10*3/uL — ABNORMAL LOW (ref 150–400)
RBC: 2.42 MIL/uL — ABNORMAL LOW (ref 4.22–5.81)
RDW: 17.1 % — ABNORMAL HIGH (ref 11.5–15.5)
WBC: 10.8 10*3/uL — ABNORMAL HIGH (ref 4.0–10.5)

## 2012-04-19 LAB — COMPREHENSIVE METABOLIC PANEL
ALT: 318 U/L — ABNORMAL HIGH (ref 0–53)
AST: 172 U/L — ABNORMAL HIGH (ref 0–37)
Albumin: 2.6 g/dL — ABNORMAL LOW (ref 3.5–5.2)
Alkaline Phosphatase: 67 U/L (ref 39–117)
BUN: 31 mg/dL — ABNORMAL HIGH (ref 6–23)
CO2: 32 mEq/L (ref 19–32)
Calcium: 8.3 mg/dL — ABNORMAL LOW (ref 8.4–10.5)
Chloride: 101 mEq/L (ref 96–112)
Creatinine, Ser: 1.08 mg/dL (ref 0.50–1.35)
GFR calc Af Amer: 73 mL/min — ABNORMAL LOW (ref >90)
GFR calc non Af Amer: 63 mL/min — ABNORMAL LOW (ref >90)
Glucose, Bld: 129 mg/dL — ABNORMAL HIGH (ref 70–99)
Potassium: 3.2 mEq/L — ABNORMAL LOW (ref 3.5–5.1)
Sodium: 141 mEq/L (ref 135–145)
Total Bilirubin: 1.1 mg/dL (ref 0.3–1.2)
Total Protein: 5.3 g/dL — ABNORMAL LOW (ref 6.0–8.3)

## 2012-04-19 LAB — TYPE AND SCREEN
Unit division: 0
Unit division: 0

## 2012-04-19 MED ORDER — INSULIN ASPART 100 UNIT/ML ~~LOC~~ SOLN
0.0000 [IU] | Freq: Three times a day (TID) | SUBCUTANEOUS | Status: DC
  Administered 2012-04-19: 2 [IU] via SUBCUTANEOUS
  Administered 2012-04-20: 17:00:00 via SUBCUTANEOUS
  Administered 2012-04-21 (×2): 2 [IU] via SUBCUTANEOUS

## 2012-04-19 MED ORDER — OXYCODONE HCL 5 MG PO TABS: 5.0000 mg | ORAL_TABLET | ORAL | Status: DC | PRN

## 2012-04-19 MED ORDER — ACETAMINOPHEN 325 MG PO TABS
650.0000 mg | ORAL_TABLET | Freq: Four times a day (QID) | ORAL | Status: DC | PRN
  Administered 2012-04-19 – 2012-04-20 (×2): 650 mg via ORAL
  Filled 2012-04-19 (×2): qty 2

## 2012-04-19 MED ORDER — SODIUM CHLORIDE 0.9 % IV SOLN: 250.0000 mL | INTRAVENOUS | Status: DC | PRN

## 2012-04-19 MED ORDER — METOPROLOL TARTRATE 12.5 MG HALF TABLET
12.5000 mg | ORAL_TABLET | Freq: Two times a day (BID) | ORAL | Status: DC
  Administered 2012-04-19 – 2012-04-21 (×4): 12.5 mg via ORAL
  Filled 2012-04-19 (×5): qty 1

## 2012-04-19 MED ORDER — POTASSIUM CHLORIDE CRYS ER 20 MEQ PO TBCR
40.0000 meq | EXTENDED_RELEASE_TABLET | Freq: Two times a day (BID) | ORAL | Status: AC
  Administered 2012-04-19 (×2): 40 meq via ORAL
  Filled 2012-04-19 (×2): qty 2

## 2012-04-19 MED ORDER — SODIUM CHLORIDE 0.9 % IJ SOLN: 3.0000 mL | INTRAMUSCULAR | Status: DC | PRN

## 2012-04-19 MED ORDER — MOVING RIGHT ALONG BOOK
Freq: Once | Status: AC
  Administered 2012-04-19: 18:00:00
  Filled 2012-04-19: qty 1

## 2012-04-19 MED ORDER — FUROSEMIDE 40 MG PO TABS
40.0000 mg | ORAL_TABLET | Freq: Every day | ORAL | Status: DC
  Administered 2012-04-20 – 2012-04-21 (×2): 40 mg via ORAL
  Filled 2012-04-19 (×2): qty 1

## 2012-04-19 MED ORDER — ENOXAPARIN SODIUM 40 MG/0.4ML ~~LOC~~ SOLN
40.0000 mg | SUBCUTANEOUS | Status: DC
  Administered 2012-04-19 – 2012-04-20 (×2): 40 mg via SUBCUTANEOUS
  Filled 2012-04-19 (×3): qty 0.4

## 2012-04-19 MED ORDER — FERROUS GLUCONATE 324 (38 FE) MG PO TABS
324.0000 mg | ORAL_TABLET | Freq: Two times a day (BID) | ORAL | Status: DC
  Administered 2012-04-19 – 2012-04-21 (×5): 324 mg via ORAL
  Filled 2012-04-19 (×7): qty 1

## 2012-04-19 MED ORDER — ONDANSETRON HCL 4 MG PO TABS: 4.0000 mg | ORAL_TABLET | Freq: Four times a day (QID) | ORAL | Status: DC | PRN

## 2012-04-19 MED ORDER — SODIUM CHLORIDE 0.9 % IJ SOLN
3.0000 mL | Freq: Two times a day (BID) | INTRAMUSCULAR | Status: DC
  Administered 2012-04-19 – 2012-04-21 (×5): 3 mL via INTRAVENOUS

## 2012-04-19 MED ORDER — ASPIRIN EC 325 MG PO TBEC
325.0000 mg | DELAYED_RELEASE_TABLET | Freq: Every day | ORAL | Status: DC
  Administered 2012-04-20 – 2012-04-21 (×2): 325 mg via ORAL
  Filled 2012-04-19 (×2): qty 1

## 2012-04-19 MED ORDER — POTASSIUM CHLORIDE CRYS ER 20 MEQ PO TBCR
40.0000 meq | EXTENDED_RELEASE_TABLET | Freq: Every day | ORAL | Status: DC
Start: 2012-04-20 — End: 2012-04-21
  Administered 2012-04-20 – 2012-04-21 (×2): 40 meq via ORAL
  Filled 2012-04-19 (×2): qty 2

## 2012-04-19 MED ORDER — PANTOPRAZOLE SODIUM 40 MG PO TBEC
40.0000 mg | DELAYED_RELEASE_TABLET | Freq: Every day | ORAL | Status: DC
  Administered 2012-04-20 – 2012-04-21 (×2): 40 mg via ORAL
  Filled 2012-04-19 (×2): qty 1

## 2012-04-19 MED ORDER — ONDANSETRON HCL 4 MG/2ML IJ SOLN: 4.0000 mg | Freq: Four times a day (QID) | INTRAMUSCULAR | Status: DC | PRN

## 2012-04-19 MED ORDER — TRAMADOL HCL 50 MG PO TABS: 50.0000 mg | ORAL_TABLET | ORAL | Status: DC | PRN

## 2012-04-19 MED FILL — Sodium Chloride Irrigation Soln 0.9%: Qty: 3000 | Status: AC

## 2012-04-19 MED FILL — Potassium Chloride Inj 2 mEq/ML: INTRAVENOUS | Qty: 40 | Status: AC

## 2012-04-19 MED FILL — Mannitol IV Soln 20%: INTRAVENOUS | Qty: 500 | Status: AC

## 2012-04-19 MED FILL — Electrolyte-R (PH 7.4) Solution: INTRAVENOUS | Qty: 5000 | Status: AC

## 2012-04-19 MED FILL — Magnesium Sulfate Inj 50%: INTRAMUSCULAR | Qty: 10 | Status: AC

## 2012-04-19 MED FILL — Heparin Sodium (Porcine) Inj 1000 Unit/ML: INTRAMUSCULAR | Qty: 30 | Status: AC

## 2012-04-19 MED FILL — Heparin Sodium (Porcine) Inj 1000 Unit/ML: INTRAMUSCULAR | Qty: 10 | Status: AC

## 2012-04-19 MED FILL — Sodium Chloride IV Soln 0.9%: INTRAVENOUS | Qty: 1000 | Status: AC

## 2012-04-19 MED FILL — Sodium Bicarbonate IV Soln 8.4%: INTRAVENOUS | Qty: 50 | Status: AC

## 2012-04-19 NOTE — Consult Note (Signed)
WOC consult Note Reason for Consult:Consult requested for buttocks wounds. Pt with skin tears noted upon arrival to ICU after trip to OR. He states he has been receiving radiation therapy prior to admission and his skin has become thin and fragile. Wound type: Partial thickness skin tears in 3 areas. Measurement: Left upper buttock 8X1X.1cm, left lower buttock 1X1X.1cm, right buttock 4X1X.1cm  Wound bed: All sites 100% red and moist Drainage (amount, consistency, odor) no odor, minimal yellow drainage Periwound: intact skin surrounding.  Inner gluteal fold red and moist, slightly macerated Dressing procedure/placement/frequency: Foam dressing to protect and promote healing.  Pt is on Sport low air loss bed to reduce pressure.  Getting OOB to chair at this stage of hospitalization. Discussed etiology, topical treatment, and preventive measures to promote healing to effective areas. Will not plan to follow further unless re-consulted.  [address], RN, MSN, [employer]  [phone]

## 2012-04-19 NOTE — Progress Notes (Signed)
Inpatient Diabetes Program Recommendations  AACE/ADA: New Consensus Statement on Inpatient Glycemic Control (2013)  Target Ranges:  Prepandial:   less than 140 mg/dL      Peak postprandial:   less than 180 mg/dL (1-2 hours)      Critically ill patients:  140 - 180 mg/dL    Results for Eric Rivers, Eric Rivers (MRN [id number]) as of 04/19/2012 09:26  Ref. Range 04/17/2012 23:59 04/18/2012 03:43 04/18/2012 08:16 04/18/2012 08:55 04/18/2012 09:22 04/18/2012 11:58 04/18/2012 16:25 04/18/2012 20:03  Glucose-Capillary Latest Range: 70-99 mg/dL 657 (H) 846 (H) 50 (L) 49 (L) 150 (H) 136 (H) 104 (H) 124 (H)    Inpatient Diabetes Program Recommendations Correction (SSI): Please change Novolog correction scale (SSI) to tid ac + HS (currently ordered as Q4 hour coverage).  Note: Will follow. Ambrose Finland RN, MSN, CDE Diabetes Coordinator Inpatient Diabetes Program [phone]

## 2012-04-19 NOTE — Progress Notes (Signed)
CARDIAC REHAB PHASE I   PRE:  Rate/Rhythm: 83 afib    BP: sitting 111/59    SaO2: 98 2L,    MODE:  Ambulation: 140 ft   POST:  Rate/Rhythm: 99    BP: sitting 124/64     SaO2: 97 2L  Used RW, 2L, assist x2. Pt reluctant but willing. Supported himself with RW throughout walk. DOE with multiple short rest stops. SaO2 maintained 97-99 2L. Return to bed for nap. Pt sts he will get to recliner after his nap. Will f/u. 1320-1400 Harriet Masson CES, ACSM

## 2012-04-19 NOTE — Care Management Note (Unsigned)
    Page 1 of 1   04/19/2012     2:21:12 PM   CARE MANAGEMENT NOTE 04/19/2012  Patient:  Eric Rivers, Eric Rivers   Account Number:  [id number]  Date Initiated:  04/19/2012  Documentation initiated by:  Caroline Longie  Subjective/Objective Assessment:   PT ADM ON 04/15/12 WITH TYPE A AORTIC DISSECTION.  PTA, PT LIVES AT HOME ALONE.     Action/Plan:   PHYSICAL THERAPIST RECOMMENDING ST-SNF FOR REHAB.  WILL CONSULT CSW TO FACILITATE DC TO SNF WHEN MEDICALLY STABLE   Anticipated DC Date:  04/21/2012   Anticipated DC Plan:  SKILLED NURSING FACILITY  In-house referral  Clinical Social Worker      DC Planning Services  CM consult      Choice offered to / List presented to:             Status of service:  In process, will continue to follow Medicare Important Message given?   (If response is "NO", the following Medicare IM given date fields will be blank) Date Medicare IM given:   Date Additional Medicare IM given:    Discharge Disposition:    Per UR Regulation:  Reviewed for med. necessity/level of care/duration of stay  If discussed at Long Length of Stay Meetings, dates discussed:    Comments:

## 2012-04-19 NOTE — Progress Notes (Addendum)
TCTS DAILY PROGRESS NOTE                   [address]            [place] [address]          [phone]      4 Days Post-Op Procedure(s) (LRB): ASCENDING AORTIC ROOT REPLACEMENT (N/A) TRANSESOPHAGEAL ECHOCARDIOGRAM (TEE) (N/A)  Total Length of Stay:  LOS: 4 days   Subjective: OOB in chair.  Just had a BM.  No complaints.   Objective: Vital signs in last 24 hours: Temp:  [97.9 F (36.6 C)-98.3 F (36.8 C)] 98.2 F (36.8 C) (02/17 0757) Pulse Rate:  [61-101] 85 (02/17 0800) Cardiac Rhythm:  [-] Atrial fibrillation (02/17 0800) Resp:  [13-25] 13 (02/17 0800) BP: (84-125)/(43-73) 109/55 mmHg (02/17 0800) SpO2:  [84 %-100 %] 97 % (02/17 0800) Weight:  [241 lb 13.5 oz (109.7 kg)] 241 lb 13.5 oz (109.7 kg) (02/17 0500)  Filed Weights   04/17/12 0500 04/18/12 0500 04/19/12 0500  Weight: 250 lb 14.1 oz (113.8 kg) 251 lb 8.7 oz (114.1 kg) 241 lb 13.5 oz (109.7 kg)    Weight change: -9 lb 11.2 oz (-4.4 kg)   CBGs: 104-124-81-98-129-117     Intake/Output from previous day: 02/16 0701 - 02/17 0700 In: 1233 [P.O.:480; I.V.:403; Blood:350] Out: 3905 [Urine:3905]  Intake/Output this shift:    Current Meds: Scheduled Meds: . acetaminophen  1,000 mg Oral Q6H   Or  . acetaminophen (TYLENOL) oral liquid 160 mg/5 mL  975 mg Per Tube Q6H  . allopurinol  300 mg Oral Daily  . aspirin EC  325 mg Oral Daily   Or  . aspirin  324 mg Per Tube Daily  . atorvastatin  20 mg Oral q1800  . furosemide  80 mg Intravenous Q12H  . insulin aspart  0-24 Units Subcutaneous Q4H  . insulin detemir  20 Units Subcutaneous QHS  . insulin regular  0-10 Units Intravenous TID WC  . metoprolol tartrate  12.5 mg Oral BID   Or  . metoprolol tartrate  12.5 mg Per Tube BID  . pantoprazole  40 mg Oral Daily  . sodium chloride  3 mL Intravenous Q12H  . Tamsulosin HCl  0.4 mg Oral QHS   Continuous Infusions: . sodium chloride Stopped (04/16/12 0900)  . sodium chloride Stopped  (04/18/12 2000)  . lactated ringers 20 mL/hr at 04/19/12 0300   PRN Meds:.sodium chloride, metoprolol, ondansetron (ZOFRAN) IV, oxyCODONE, sodium chloride  Physical Exam: General appearance: alert, cooperative and no distress Heart: regular rate and rhythm and irregularly irregular rhythm Lungs: diminished breath sounds bibasilar Extremities: Mild LE edema, some mild edema in R hand Wound: Dressed and dry   Lab Results: CBC: Recent Labs  04/18/12 0339 04/19/12 0435  WBC 10.9* 10.8*  HGB 6.8* 7.5*  HCT 19.9* 22.5*  PLT 74* 88*   BMET:  Recent Labs  04/18/12 0339 04/19/12 0435  NA 133* 141  K 3.6 3.2*  CL 100 101  CO2 28 32  GLUCOSE 131* 129*  BUN 40* 31*  CREATININE 1.49* 1.08  CALCIUM 8.0* 8.3*      Radiology: Dg Chest Port 1 View  04/19/2012  *RADIOLOGY REPORT*  Clinical Data: Aortic dissection repair  PORTABLE CHEST - 1 VIEW  Comparison: 04/15/2012, 04/18/2012  Findings: Right IJ vascular sheath removed.  Stable cardiomegaly and widened mediastinum as before.  Median sternotomy wires appear intact.  Right lung remains clear.  Residual  left base atelectasis / consolidation persist with a small effusion.  No interval change. No pneumothorax.  IMPRESSION: Persistent left base atelectasis / consolidation and small effusion.  Stable postoperative appearance.   Original Report Authenticated By: Judie Petit. Miles Costain, M.D.    Dg Chest Port 1 View  04/18/2012  *RADIOLOGY REPORT*  Clinical Data: Postop chest tube.  PORTABLE CHEST - 1 VIEW  Comparison: 04/17/2012  Findings: Prior CABG.  Cardiomegaly with vascular congestion.  Mild perihilar interstitial prominence could reflect early interstitial edema.  Increasing left lower lobe atelectasis or consolidation. Suspect small bilateral effusions.  IMPRESSION: Increasing perihilar interstitial prominence, question interstitial edema.  Small bilateral pleural effusions.  Increasing left lower lobe atelectasis or consolidation.   Original Report  Authenticated By: Charlett Nose, M.D.      Assessment/Plan: S/P Procedure(s) (LRB): ASCENDING AORTIC ROOT REPLACEMENT (N/A) TRANSESOPHAGEAL ECHOCARDIOGRAM (TEE) (N/A)  Expected postop blood loss anemia- improved after transfusion yesterday. Continue to monitor.  CV- Rate controlled AF, SBPs low normal. Continue low dose beta blocker.  DM- sugars stable on Levemir. Once eating better, will resume po meds.  Acute renal insufficiency- Cr down today, UOP excellent. Consider d/c Foley soon.  Ambulate, continue pulm toilet/IS.  Hopefully ready for transfer to stepdown soon.     Chart reviewed, patient examined, agree with above. Hgb of 7.5 is too low for him. Will transfuse another unit prbc's and start iron. Rhythm is A-fib with controlled rate. I would not restart coumadin at this time with acute dissection. His risk of bleeding is higher than risk of stroke. If he is doing well in a month then he can restart coumadin. Transfer to 2000 and continue mobilization.   COLLINS,GINA H 04/19/2012 8:20 AM

## 2012-04-20 ENCOUNTER — Ambulatory Visit: Payer: Medicare Other

## 2012-04-20 LAB — BASIC METABOLIC PANEL
GFR calc Af Amer: >90 mL/min (ref >90)
GFR calc non Af Amer: 79 mL/min — ABNORMAL LOW (ref >90)
Potassium: 4.1 mEq/L (ref 3.5–5.1)
Sodium: 143 mEq/L (ref 135–145)

## 2012-04-20 LAB — GLUCOSE, CAPILLARY

## 2012-04-20 LAB — TYPE AND SCREEN: ABO/RH(D): O POS

## 2012-04-20 LAB — CBC
Hemoglobin: 8.1 g/dL — ABNORMAL LOW (ref 13.0–17.0)
MCHC: 33.5 g/dL (ref 30.0–36.0)
RDW: 18.1 % — ABNORMAL HIGH (ref 11.5–15.5)

## 2012-04-20 MED ORDER — FUROSEMIDE 40 MG PO TABS: 40.0000 mg | ORAL_TABLET | Freq: Every day | ORAL | Status: DC

## 2012-04-20 MED ORDER — POTASSIUM CHLORIDE CRYS ER 20 MEQ PO TBCR: 20.0000 meq | EXTENDED_RELEASE_TABLET | Freq: Every day | ORAL | Status: DC

## 2012-04-20 MED ORDER — LINAGLIPTIN 5 MG PO TABS
5.0000 mg | ORAL_TABLET | Freq: Every day | ORAL | Status: DC
  Administered 2012-04-20 – 2012-04-21 (×2): 5 mg via ORAL
  Filled 2012-04-20 (×2): qty 1

## 2012-04-20 MED ORDER — ASPIRIN 325 MG PO TBEC: 325.0000 mg | DELAYED_RELEASE_TABLET | Freq: Every day | ORAL | Status: DC

## 2012-04-20 MED ORDER — TRAMADOL HCL 50 MG PO TABS: 50.0000 mg | ORAL_TABLET | ORAL | Status: DC | PRN

## 2012-04-20 MED ORDER — FERROUS GLUCONATE 324 (38 FE) MG PO TABS: 324.0000 mg | ORAL_TABLET | Freq: Two times a day (BID) | ORAL | Status: DC

## 2012-04-20 MED ORDER — METOPROLOL TARTRATE 25 MG PO TABS: 12.5000 mg | ORAL_TABLET | Freq: Two times a day (BID) | ORAL | Status: DC

## 2012-04-20 NOTE — Progress Notes (Addendum)
                   [address]            [place] [address]          [phone]      5 Days Post-Op Procedure(s) (LRB): ASCENDING AORTIC ROOT REPLACEMENT (N/A) TRANSESOPHAGEAL ECHOCARDIOGRAM (TEE) (N/A)  Subjective: Patient feeling fatigued;otherwise, doing "ok"  Objective: Vital signs in last 24 hours: Temp:  [97.8 F (36.6 C)-98.6 F (37 C)] 98.1 F (36.7 C) (02/18 0435) Pulse Rate:  [82-89] 88 (02/18 0435) Cardiac Rhythm:  [-] Atrial fibrillation (02/17 1950) Resp:  [13-20] 19 (02/18 0435) BP: (101-138)/(48-67) 107/65 mmHg (02/18 0435) SpO2:  [96 %-100 %] 98 % (02/18 0435) Weight:  [110.1 kg (242 lb 11.6 oz)] 110.1 kg (242 lb 11.6 oz) (02/18 8295)  Pre op weight 107 kg Current Weight  04/20/12 110.1 kg (242 lb 11.6 oz)      Intake/Output from previous day: 02/17 0701 - 02/18 0700 In: 1467.5 [P.O.:1080; Blood:12.5] Out: 775 [Urine:775]   Physical Exam:  Cardiovascular: [facility] Pulmonary: Diminished at bases; no rales, wheezes, or rhonchi. Abdomen: Soft, non tender, bowel sounds present. Extremities: Mild bilateral lower extremity edema. Pulses intact Wounds: Clean and dry.  No erythema or signs of infection. Ecchymosis around axillary wound  Lab Results: CBC: Recent Labs  04/19/12 0435 04/20/12 0625  WBC 10.8* 10.7*  HGB 7.5* 8.1*  HCT 22.5* 24.2*  PLT 88* 125*   BMET:  Recent Labs  04/18/12 0339 04/19/12 0435  NA 133* 141  K 3.6 3.2*  CL 100 101  CO2 28 32  GLUCOSE 131* 129*  BUN 40* 31*  CREATININE 1.49* 1.08  CALCIUM 8.0* 8.3*    PT/INR:  Lab Results  Component Value Date   INR 0.94 04/15/2012   INR 0.92 04/15/2012   INR 2.56* 04/15/2012   PROTIME 16.1 08/16/2008   ABG:  INR: Will add last result for INR, ABG once components are confirmed Will add last 4 CBG results once components are confirmed  Assessment/Plan:  1. CV - Afib with CVR. On Lopressor 12.5 bid. Unable to increase Lopressor secondary to labile  BP. No coumadin now as the risk of bleeding is greater than stroke. 2.  Pulmonary - Wean oxygen as tolerates (on 2 liters via [place]).Encourage incentive spirometer 3. Volume Overload - Continue Lasix 40 daily 4.  Acute blood loss anemia - Received one unit PRBC and H and H is up to 8.1 24.2. Continue Ferrous gluconate 5.DM-CBGs 129/117/108. On Levemir 20 at hs.Will stop schedule insulin and restart Januvia. Last HGA1C 6.1 6.Thrombocytopenia-platelets up to 125,000 7.When ready for discharge, will need SNF as lives alone. Ordered PT and OT  ZIMMERMAN,DONIELLE MPA-C 04/20/2012,7:32 AM   Chart reviewed, patient examined, agree with above.

## 2012-04-20 NOTE — Clinical Social Work Psychosocial (Signed)
Clinical Social Work Department BRIEF PSYCHOSOCIAL ASSESSMENT 04/20/2012  Patient:  Eric Rivers, Eric Rivers     Account Number:  [id number]     Admit date:  04/15/2012  Clinical Social Worker:  Thomasene Mohair  Date/Time:  04/20/2012 11:30 AM  Referred by:  Care Management  Date Referred:  04/19/2012 Referred for  SNF Placement   Other Referral:   Interview type:  Patient Other interview type:    PSYCHOSOCIAL DATA Living Status:  ALONE Admitted from facility:   Level of care:   Primary support name:  Menius,James Md Friend [phone]   [phone] Primary support relationship to patient:  FRIEND Degree of support available:   adequate    CURRENT CONCERNS Current Concerns  Post-Acute Placement  Adjustment to Illness   Other Concerns:    SOCIAL WORK ASSESSMENT / PLAN CSW was referred to Pt to assist with dc planning. Pt is single, lives alone, no children, no siblings or close relatives in the area. 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 He travels quiet extensively and enjoys time with friends in the area. Pt realizes that he needs rehab and nursing care before returning home alone. Pt is agreeable to SNF at dc. CSW and Pt discussed insurance and placement process. CSW will begin SNF bed search.   Assessment/plan status:  Psychosocial Support/Ongoing Assessment of Needs Other assessment/ plan:   Information/referral to community resources:   SNF list    PATIENT'S/FAMILY'S RESPONSE TO PLAN OF CARE: Pt is alert, oriented, and pleasant. Pt is agreeable to SNF at dc.    Frederico Hamman, LCSW [phone]

## 2012-04-20 NOTE — Progress Notes (Signed)
Physical Therapy Treatment Patient Details Name: Eric Rivers MRN: [id number] DOB: 05-Jun-1930 Today's Date: 04/20/2012 Time: 1191-4782 PT Time Calculation (min): 20 min  PT Assessment / Plan / Recommendation Comments on Treatment Session  Pt reluctantly agreed to participate with encouragement.  Agreeable only to a short walk with PT.  Pt with episode of urinary incontinence during session, performed static standing balance for hygiene and dressing.  Required assist with dressing task due to fatigue.  Pt limited by decreased activity tolerance limiting functional mobility.    Follow Up Recommendations  Home health PT;SNF;Supervision for mobility/OOB           Equipment Recommendations  Rolling walker with 5" wheels       Frequency Min 3X/week   Plan Discharge plan remains appropriate;Frequency remains appropriate    Precautions / Restrictions Precautions Precautions: Fall Restrictions Weight Bearing Restrictions: No   Pertinent Vitals/Pain No c/o pain.    Mobility  Transfers Sit to Stand: 4: Min assist Stand to Sit: 4: Min assist Ambulation/Gait Ambulation/Gait Assistance: 5: Supervision Ambulation Distance (Feet): 100 Feet Assistive device: Rolling walker Ambulation/Gait Assistance Details: decreased cadence, cues for deep breathing, 1 standing rest break      PT Goals Acute Rehab PT Goals PT Goal: Sit to Stand - Progress: Progressing toward goal PT Goal: Stand to Sit - Progress: Progressing toward goal PT Goal: Ambulate - Progress: Progressing toward goal  Visit Information  Last PT Received On: 04/20/12 Assistance Needed: +1    Subjective Data  Subjective: I'm tired from walking earlier, but I'll do a short walk   Cognition  Cognition Overall Cognitive Status: Appears within functional limits for tasks assessed/performed Behavior During Session: [facility] for tasks performed    Balance  Static Standing Balance Static Standing - Balance Support: During  functional activity Static Standing - Level of Assistance: 5: Stand by assistance Static Standing - Comment/# of Minutes: 5 minutes for dressing/hygiene task after episode of urinary incontinence  End of Session PT - End of Session Equipment Utilized During Treatment: Gait belt Activity Tolerance: Patient tolerated treatment well Patient left: in chair;with call bell/phone within reach;with family/visitor present Nurse Communication: Mobility status   GP     DONAWERTH,KAREN 04/20/2012, 2:01 PM

## 2012-04-20 NOTE — Progress Notes (Signed)
CARDIAC REHAB PHASE I   PRE:  Rate/Rhythm: 80 afib  BP:  Supine: 118/78  Sitting:   Standing:    SaO2: 96-98% 2L  MODE:  Ambulation: 360 ft   POST:  Rate/Rhythem: 90  BP:  Supine:   Sitting: 150/90  Standing:    SaO2: 97%RA 0950-1023 Pt walked 360 ft on RA with rolling walker and asst x 1. Stopped frequently to rest. Tolerated increase in distance well. Sats good on RA. To recliner with call bell. Left off oxygen.  Duanne Limerick

## 2012-04-21 ENCOUNTER — Ambulatory Visit: Payer: Medicare Other

## 2012-04-21 DIAGNOSIS — M6250 Muscle wasting and atrophy, not elsewhere classified, unspecified site: Secondary | ICD-10-CM | POA: Diagnosis not present

## 2012-04-21 DIAGNOSIS — I359 Nonrheumatic aortic valve disorder, unspecified: Secondary | ICD-10-CM | POA: Diagnosis not present

## 2012-04-21 DIAGNOSIS — R0989 Other specified symptoms and signs involving the circulatory and respiratory systems: Secondary | ICD-10-CM | POA: Diagnosis not present

## 2012-04-21 DIAGNOSIS — J9819 Other pulmonary collapse: Secondary | ICD-10-CM | POA: Diagnosis not present

## 2012-04-21 LAB — GLUCOSE, CAPILLARY: Glucose-Capillary: 123 mg/dL — ABNORMAL HIGH (ref 70–99)

## 2012-04-21 MED ORDER — POTASSIUM CHLORIDE CRYS ER 20 MEQ PO TBCR: 20.0000 meq | EXTENDED_RELEASE_TABLET | Freq: Every day | ORAL | Status: DC

## 2012-04-21 NOTE — Progress Notes (Signed)
04/21/2012 6:04 PM Nursing note Report called to [facility]. Questions and concerned addressed.  Noa Galvao, Blanchard Kelch

## 2012-04-21 NOTE — Progress Notes (Signed)
04/21/2012 11:36 AM Nursing note epw and cts d/c per orders and per protocol. Ends entact for epw.  Benzoin and steri strips applied to cts sites. Pt. Advised bedrest for one hour. Call bell within reach. Pt. Tolerated procedure well. Frequent vital signs collected per protocol.  Minetta Krisher, Blanchard Kelch

## 2012-04-21 NOTE — Progress Notes (Signed)
OT Cancellation Note  Patient Details Name: WESTLY HINNANT MRN: [id number] DOB: 31-Mar-1930   Cancelled Treatment:    Reason Eval/Treat Not Completed: Other (comment) (pace wires being removed; bedrest).  Per PT, pt going SNF later today.  04/21/2012 Cipriano Mile OTR/L Pager [phone] Office [phone]

## 2012-04-21 NOTE — Discharge Summary (Signed)
[address]            [place] [address]          [phone]         Discharge Summary  Name: Eric Rivers DOB: 04-18-1930 79 y.o. MRN: [id number]   Admission Date: 04/15/2012 Discharge Date: 04/21/2012    Admitting Diagnosis: Chest pain    Discharge Diagnosis:  Type A aortic dissection Expected postoperative blood loss anemia\ Acute renal insufficiency Postoperative atrial fibrillation Deconditioning  Past Medical History  Diagnosis Date  . Whooping cough   . Bladder cancer     recurrence with TUR-B March '09  . Atrial fibrillation      Chronic,   24 hour holter, September, 2011.... atrial fib rate is controlled.... there is some bradycardia but  no marked pauses  . Diabetes mellitus     type 2  . Hypertension   . DDD (degenerative disc disease)     lumbar spine  . GERD (gastroesophageal reflux disease)   . Gout   . Fluid overload   . ACE-inhibitor cough   . Shortness of breath     on exertion  . Aortic stenosis     mild....echo... september... 2010/ mild... echo.[name] KitchenMarland KitchenDec, 2011  . Pulmonary hypertension     , echo, 01/2010  . Hyperlipidemia   . Hx of colonoscopy     approx. 10 years  . Warfarin anticoagulation     Atrial fib  . Ejection fraction     EF 60%, echo, 01/2010  . Normal nuclear stress test     06/2005 , also ABI normal 2007  . Hypercholesterolemia   . Fracture of metacarpal bone 11/30/11    Procedures: RESECTION AND GRAFTING OF ASCENDING AORTA UNDER HYPOTHERMIC CIRCULATORY ARREST (30 mm Hemashield graft) -04/15/2012   HPI:  The patient is a 79 y.o. male with a history of diabetes, hypertension, chronic atrial fibrillation treated with Coumadin, and recently diagnosed prostate cancer who is undergoing radiation therapy and was in his usual state of health when he woke up overnight to go to the bathroom. He developed severe headache and chest pain which did not resolve and he called EMS. He was  brought to the emergency room at [facility] for evaluation.  He underwent a CT scan of the chest to rule out pulmonary embolism. This did not show pulmonary embolism but did show a type A aortic dissection. Unfortunately there was no significant contrast within the aorta but it was possible to see the dissection plane due to calcification in the aortic wall that was displaced. There was a small pericardial effusion. He has been followed by Dr. Myrtis Ser and Dr. Debby Bud. He had a 2-D echocardiogram in December 2011 that showed mild aortic stenosis with a peak gradient of 22 and a mean gradient of 11 and an aortic valve area of 1.5 cm. He has not had a followup echocardiogram since. CTA of the chest in 2006 did not show any aortic aneurysm or dissection at that time.  He was seen by Dr. Laneta Simmers and was noted to be hypertensive, but hemodynamically stable.  Surgery was recommended to the patient, and he elected to discuss this with his family and friends before making a decision.  He was admitted to the ICU for blood pressure control in the interim.    Hospital Course:  The patient was admitted to [facility]  on 04/15/2012. He ultimately elected to proceed with surgery.  All risks, benefits and alternatives of surgery were explained in detail, and the patient agreed to proceed. The patient was taken to the operating room and underwent the above procedure.    The postoperative course has been notable for a mild acute renal insufficiency, which is slowly improving.  He also has required transfusions for an expected postop blood loss anemia.  He has been in atrial fibrillation with controlled rates and was started on a beta blocker.  His blood pressures have been low normal and other vital signs have been stable.  He is ambulating in the halls and tolerating a regular diet.  Blood sugars are stable on his home medication.  He is deconditioned, and working with physical and occupational therapies.  He will require short  term rehab post discharge, and is medically stable for transfer once a bed is available.     Recent vital signs:  Filed Vitals:   04/21/12 1138  BP: 138/69  Pulse: 90  Temp:   Resp:     Recent laboratory studies:  CBC: Recent Labs  04/19/12 0435 04/20/12 0625  WBC 10.8* 10.7*  HGB 7.5* 8.1*  HCT 22.5* 24.2*  PLT 88* 125*   BMET:  Recent Labs  04/19/12 0435 04/20/12 0625  NA 141 143  K 3.2* 4.1  CL 101 104  CO2 32 33*  GLUCOSE 129* 114*  BUN 31* 24*  CREATININE 1.08 0.89  CALCIUM 8.3* 8.7    PT/INR: No results found for this basename: LABPROT, INR,  in the last 72 hours   Discharge Medications:     Medication List    STOP taking these medications       CELEBREX 200 MG capsule  Generic drug:  celecoxib     verapamil 240 MG (CO) 24 hr tablet  Commonly known as:  COVERA HS     warfarin 4 MG tablet  Commonly known as:  COUMADIN      TAKE these medications       allopurinol 300 MG tablet  Commonly known as:  ZYLOPRIM  take 1 tablet by mouth once daily     aspirin 325 MG EC tablet  Take 1 tablet (325 mg total) by mouth daily.     atorvastatin 20 MG tablet  Commonly known as:  LIPITOR  take 1 tablet by mouth once daily     CALCIUM + D PO  Take 1 tablet by mouth daily.     ferrous gluconate 324 MG tablet  Commonly known as:  FERGON  Take 1 tablet (324 mg total) by mouth 2 (two) times daily with a meal. For one month then stop.     finasteride 5 MG tablet  Commonly known as:  PROSCAR  Take 5 mg by mouth daily.     furosemide 40 MG tablet  Commonly known as:  LASIX  Take 1 tablet (40 mg total) by mouth daily. For 5 days then stop.     metoprolol tartrate 25 MG tablet  Commonly known as:  LOPRESSOR  Take 0.5 tablets (12.5 mg total) by mouth 2 (two) times daily.     ONCOVITE PO  Take by mouth.     pantoprazole 40 MG tablet  Commonly known as:  PROTONIX  take 1 tablet by mouth once daily     KLOR-CON M20 20 MEQ tablet  Generic drug:   potassium chloride SA  take 1 tablet by mouth once daily  SENOKOT 8.6 MG tablet  Generic drug:  senna  Take 1 tablet by mouth daily.     sitaGLIPtin 100 MG tablet  Commonly known as:  JANUVIA  Take 1 tablet (100 mg total) by mouth daily.     Tamsulosin HCl 0.4 MG Caps  Commonly known as:  FLOMAX  Take 0.4 mg by mouth at bedtime. Restarted flomax  03/12/12 after talking with MD     traMADol 50 MG tablet  Commonly known as:  ULTRAM  Take 1-2 tablets (50-100 mg total) by mouth every 4 (four) hours as needed for pain.     zolpidem 5 MG tablet  Commonly known as:  AMBIEN  Take 1 tablet (5 mg total) by mouth at bedtime as needed for sleep.         Discharge Instructions:  The patient is to refrain from driving, heavy lifting or strenuous activity.  May shower daily and clean incisions with soap and water.  May resume regular diet.   Follow Up:      Discharge Orders   Future Appointments Provider Department Dept Phone   04/26/2012 10:00 AM [facility] [place] [facility] [phone]   04/27/2012 10:00 AM [facility] [place] [facility] [phone]   04/27/2012 10:30 AM [facility] [phone]   04/28/2012 10:00 AM [facility] [place] [facility] [phone]   04/29/2012 11:00 AM [facility] [place] [facility] [phone]   04/30/2012 10:00 AM [facility] [place] [facility] [phone]   05/03/2012 11:45 AM [facility] [place] [facility] [phone]   05/04/2012 10:15 AM [facility] [place] [facility]COL[facility] [phone]   05/05/2012 10:15 AM [facility] [place] [facility] RAD[facility] ONCOL[facility] [phone]   05/06/2012 10:15 AM [facility] [place] [facility]COL[facility]  [phone]   05/07/2012 10:15 AM [facility] [phone]   05/11/2012 4:00 PM Alleen Borne, MD [facility] [phone]   Future Orders Complete By Expires     Amb Referral to Cardiac Rehabilitation  As directed        Follow-up Information   Follow up with Alleen Borne, MD. (PA/LAT CXR to be taken (at [facility] which is in the same building as Dr. Sharee Pimple office) on 05/11/2012 at 3:00pm;Appointment with Dr. Laneta Simmers is on 05/11/2012 at 4:00 pm)    Contact information:   [address] [employer] [address] [place] [address] [phone]       Follow up with Illene Regulus, MD. (Call for a follow up appointment 4 weeks)    Contact information:   [address] [place] [address] [phone]        COLLINS,GINA H 04/21/2012, 11:47 AM

## 2012-04-21 NOTE — Progress Notes (Signed)
CARDIAC REHAB PHASE I   PRE:  Rate/Rhythm: 87 afib  BP:  Supine:   Sitting: 133/73  Standing:    SaO2: 92-95%RA  MODE:  Ambulation: 300 ft   POST:  Rate/Rhythem: 102  BP:  Supine:   Sitting: 160/80  Standing:    SaO2: 100%RA hall, 97% room 1007-1114 Pt not feeling as strong today and had to sit once during walk due to fatigue. Walked 300 ft with rolling walker and asst x 1 with steady gait. Education completed with pt. Gave diabetic diet and discussed counting carbs and low sodium. Discussed CRP 2 and pt gave permission to refer to [place]. Offered to put on discharge video but pt stated did not want to view. In recliner with call bell. Pt appears dyspneic when walking but sats good. Pt states he was SOB with activity prior to surgery which normal for him.  Duanne Limerick

## 2012-04-21 NOTE — Progress Notes (Signed)
                   [address]            [place] [address]          [phone]      6 Days Post-Op Procedure(s) (LRB): ASCENDING AORTIC ROOT REPLACEMENT (N/A) TRANSESOPHAGEAL ECHOCARDIOGRAM (TEE) (N/A)  Subjective: Patient feeling "down this am", but no specific complaints otherwise.  Objective: Vital signs in last 24 hours: Temp:  [97.3 F (36.3 C)-98.3 F (36.8 C)] 97.3 F (36.3 C) (02/19 0429) Pulse Rate:  [82-92] 92 (02/19 0429) Cardiac Rhythm:  [-] Atrial fibrillation (02/19 0730) Resp:  [18-19] 19 (02/19 0429) BP: (102-132)/(62-77) 121/77 mmHg (02/19 0429) SpO2:  [94 %-97 %] 97 % (02/19 0429) Weight:  [110.2 kg (242 lb 15.2 oz)] 110.2 kg (242 lb 15.2 oz) (02/19 0429)  Pre op weight 107 kg Current Weight  04/21/12 110.2 kg (242 lb 15.2 oz)      Intake/Output from previous day: 02/18 0701 - 02/19 0700 In: 723 [P.O.:723] Out: 850 [Urine:850]   Physical Exam:  Cardiovascular: IRRR IRRR Pulmonary: Diminished at bases; no rales, wheezes, or rhonchi. Abdomen: Soft, non tender, bowel sounds present. Extremities: Mild bilateral lower extremity edema. Pulses intact Wounds: Clean and dry.  No erythema or signs of infection. Ecchymosis around axillary wound  Lab Results: CBC:  Recent Labs  04/19/12 0435 04/20/12 0625  WBC 10.8* 10.7*  HGB 7.5* 8.1*  HCT 22.5* 24.2*  PLT 88* 125*   BMET:   Recent Labs  04/19/12 0435 04/20/12 0625  NA 141 143  K 3.2* 4.1  CL 101 104  CO2 32 33*  GLUCOSE 129* 114*  BUN 31* 24*  CREATININE 1.08 0.89  CALCIUM 8.3* 8.7    PT/INR:  Lab Results  Component Value Date   INR 0.94 04/15/2012   INR 0.92 04/15/2012   INR 2.56* 04/15/2012   PROTIME 16.1 08/16/2008   ABG:  INR: Will add last result for INR, ABG once components are confirmed Will add last 4 CBG results once components are confirmed  Assessment/Plan:  1. CV - Afib with CVR. On Lopressor 12.5 bid.No coumadin now as the risk of bleeding  is greater than stroke. 2.  Pulmonary - Weaned off oxygen.Encourage incentive spirometer 3. Volume Overload - Continue Lasix 40 daily 4.  Acute blood loss anemia - Received one unit PRBC and H and H is up to 8.1 24.2. Continue Ferrous gluconate 5.DM-CBGs 134/114/123. On Januvia. Last HGA1C 6.1 6.Thrombocytopenia-platelets up to 125,000 7.To SNF when bed available  ZIMMERMAN,DONIELLE MPA-C 04/21/2012,8:52 AM

## 2012-04-21 NOTE — Progress Notes (Signed)
04/21/2012 1600 Nursing note  Discharge avs form reviewed with patient prior to discharge to SNF. Follow up appointments reviewed. Pt. Viewed educational video #113 prior to discharge. Questions and concerns addressed. Iv line d/c.   Amilio Zehnder, Blanchard Kelch

## 2012-04-21 NOTE — Progress Notes (Signed)
PT Cancellation Note  Patient Details Name: TYION BOYLEN MRN: [id number] DOB: 31-Oct-1930   Cancelled Treatment:    Reason Eval/Treat Not Completed: Medical issues which prohibited therapy (Pt had pacing wires removed/on bedrest until 1230;SNF today)   INGOLD,Leinani Lisbon 04/21/2012, 12:21 PM [facility] [phone] (pager)

## 2012-04-22 ENCOUNTER — Ambulatory Visit: Payer: Medicare Other

## 2012-04-22 ENCOUNTER — Telehealth: Payer: Self-pay | Admitting: *Deleted

## 2012-04-22 ENCOUNTER — Ambulatory Visit: Admission: RE | Admit: 2012-04-22 | Payer: Medicare Other | Source: Ambulatory Visit

## 2012-04-22 NOTE — Telephone Encounter (Signed)
[facility] facility, left message with Belva Chimes [phone]  And then called charge nurse phone [phone] spoke with the Charge Nurse Rodney Booze  That patient is to be treated rad/onc on Monday Feb.,24,14, and our radiation therapis Victorino Dike will call you at this number tomorrow  to set up time for patient to be her for his radiation treatment, thanked her, per Rodney Booze, pleas call the [phone] number, gave this information to [facility] therapist on Lianac 1 4:54 PM

## 2012-04-22 NOTE — Telephone Encounter (Signed)
Returned call from a [place] from [facility] at [phone],patient discharged to this facilty and patient asking when to restart radiation,  needed to arrange transportation if MD wants him to come back this coming Monday, please call  Back at [phone] or chatge nurse at [phone] , will ask Md and call her back with status 2:26 PM

## 2012-04-23 ENCOUNTER — Ambulatory Visit: Payer: Medicare Other

## 2012-04-23 ENCOUNTER — Telehealth: Payer: Self-pay | Admitting: *Deleted

## 2012-04-23 NOTE — Telephone Encounter (Signed)
Returned vm from [employer], SW at [facility] where pt is residing.  Pt is under skilled rehab, to stay for up to 20 days. She states pt does not want to resume radiation tx on 04/26/12. Pt told Ms Herminio Commons he is "too fatigued, sore from surgery and is having bladder incontinence". Informed Ms Herminio Commons will tell Dr Kathrynn Running of pt's request. She will receive call back from this dept either this afternoon or Monday regarding pt resuming radiation tx. Her # [phone].

## 2012-04-23 NOTE — Clinical Social Work Placement (Signed)
Late note 2/21/4 for 04/21/2012  Clinical Social Work Department CLINICAL SOCIAL WORK PLACEMENT NOTE 04/23/2012  Patient:  Eric Rivers, Eric Rivers  Account Number:  [id number] Admit date:  04/15/2012  Clinical Social Worker:  Frederico Hamman, LCSW  Date/time:  04/23/2012 01:00 PM  Clinical Social Work is seeking post-discharge placement for this patient at the following level of care:   SKILLED NURSING   (*CSW will update this form in Epic as items are completed)   04/20/2012  Patient/family provided with [facility] Department of Clinical Social Work's list of facilities offering this level of care within the geographic area requested by the patient (or if unable, by the patient's family).  04/20/2012  Patient/family informed of their freedom to choose among providers that offer the needed level of care, that participate in Medicare, Medicaid or managed care program needed by the patient, have an available bed and are willing to accept the patient.  04/20/2012  Patient/family informed of [facility]' ownership interest in [facility], as well as of the fact that they are under no obligation to receive care at this facility.  PASARR submitted to EDS on 04/21/2012 PASARR number received from EDS on 04/21/2012  FL2 transmitted to all facilities in geographic area requested by pt/family on  04/20/2012 FL2 transmitted to all facilities within larger geographic area on   Patient informed that his/her managed care company has contracts with or will negotiate with  certain facilities, including the following:     Patient/family informed of bed offers received:  04/20/2012 Patient chooses bed at [facility] Physician recommends and patient chooses bed at    Patient to be transferred to [facility] on  04/21/2012 Patient to be transferred to facility by ptar  The following physician request were entered in Epic:   Additional Comments:  Pt very pleased with DC  process and plan for rehab at [facility].  No further CSW needs.   Frederico Hamman, LCSW

## 2012-04-26 ENCOUNTER — Ambulatory Visit: Payer: Medicare Other

## 2012-04-26 ENCOUNTER — Telehealth: Payer: Self-pay | Admitting: *Deleted

## 2012-04-26 ENCOUNTER — Ambulatory Visit: Admission: RE | Admit: 2012-04-26 | Payer: Medicare Other | Source: Ambulatory Visit

## 2012-04-26 NOTE — Telephone Encounter (Signed)
Called for update on pt, checking to see if pt wants to resume radiation tx. Left vm and requested call back. Ms Eric Rivers to speak w/J Katherene Ponto, RN for Dr Kathrynn Running. On 04/23/12 Ms Eric Rivers had stated pt did not want to resume radiation tx today, on 04/26/12 as scheduled following his break.

## 2012-04-27 ENCOUNTER — Ambulatory Visit (INDEPENDENT_AMBULATORY_CARE_PROVIDER_SITE_OTHER): Payer: Medicare Other | Admitting: General Practice

## 2012-04-27 ENCOUNTER — Telehealth: Payer: Self-pay | Admitting: *Deleted

## 2012-04-27 ENCOUNTER — Ambulatory Visit: Payer: Medicare Other

## 2012-04-27 NOTE — Telephone Encounter (Signed)
[employer]ine [employer] education officer at [employer] ,she spoke with the patient and he feels he's not sure he is cleared from Heart MD to restart radiation therapy.thanked her for the call back Will inform Md

## 2012-04-27 NOTE — Telephone Encounter (Signed)
Called [facility] [phone] with Asher Muir, asking her when patient's  last Lurpon injection was given at this office," Lupron 30  mg IM given on 11/14/11, " should be every 4 months and he is past due per Asher Muir, she will ask Dr.woodruff what next step is to him getting his Lupron injection, she took [facility] rehab number and will call us back with status 3:56 PM

## 2012-04-27 NOTE — Telephone Encounter (Signed)
Called [phone] spoke with Charge Nurse Shanda Bumps about patient's status, "he hasn't even had 1 week of physical therapy as yet,was admitted last Thursday",patient still weak wants to hold off on radiation still , asked Shanda Bumps to speak with patient and call us back when she or patient  feels he can resume rad txs, She stated she would call back 9:23 AM

## 2012-04-27 NOTE — Telephone Encounter (Signed)
Shanda Bumps, RN called [facility] called, stating since he is in [place], they only have 1 van for transportation, and Physical Therapist states Patient is so weak, and not strong enough to be transported to [facility] at this time and he will need a re-eval in 3-4 weeks to see if strength improves, and patient concern is that he wants his Primary Md and Dr.Manning to talk before returning for chemo or radiation, will relay information to MD,thanked Shanda Bumps for call ing again today 3:07 PM

## 2012-04-28 ENCOUNTER — Ambulatory Visit: Admit: 2012-04-28 | Payer: Medicare Other

## 2012-04-28 ENCOUNTER — Ambulatory Visit: Payer: Medicare Other

## 2012-04-28 ENCOUNTER — Encounter: Payer: Self-pay | Admitting: Radiation Oncology

## 2012-04-28 ENCOUNTER — Telehealth: Payer: Self-pay | Admitting: *Deleted

## 2012-04-28 NOTE — Telephone Encounter (Signed)
Jamie from [facility] per [facility], he put in order for Mr. Brodhead to get testosterone level at [facility] and if level is greater than castrate levels then he will order for Lupron injection, she just wanted to let us know this is being handled by their  office,thanked her and will let Dr.Manning know as well 1:16 PM

## 2012-04-28 NOTE — Progress Notes (Signed)
  Radiation Oncology         [phone]) [phone] ________________________________  Name: Eric Rivers MRN: [id number]  Date: 04/28/2012  DOB: 1930/09/22  Chart Note:  I reviewed this patient's most recent findings and wanted to take a minute to document my impression.  [name] has completed 75% of his course of his radiation therapy for prostate cancer when he presented with an aortic dissection requiring emergent repair. He has consequently been on breakthrough from radiation treatment since February 12. In an attempt to resume radiation treatment, we have learned that the patient is continuing to regain strength and recover at [facility] skilled nursing facility. They have projected that he will be unable to return to radiation for another 3 or 4 weeks depending on his recovery. Given the patient's pre-hospitalization excellent performance status, I would recommend a continued curative approach in the management of his prostate cancer with completion of prostate radiation therapy if this remains clinically appropriate. Fortunately, the patient did receive Lupron therapy in the neoadjuvant setting and this should help maintain control of his prostate cancer through his course of radiation and any associated break.  Impression:  In light of this information, we will continue to monitor the patient's progress and resume radiation treatment if and when this is clinically appropriate.  Plan:  At this point, the patient is set up to proceed with a testosterone measurement under the direction of Dr. Margarita Grizzle to confirm that he remains at castrate testosterone levels during this break from radiation treatment.  ________________________________  Artist Pais. Kathrynn Running, M.D.

## 2012-04-29 ENCOUNTER — Ambulatory Visit: Payer: Medicare Other

## 2012-04-30 ENCOUNTER — Ambulatory Visit: Payer: Medicare Other

## 2012-05-03 ENCOUNTER — Ambulatory Visit: Payer: Medicare Other

## 2012-05-04 ENCOUNTER — Telehealth: Payer: Self-pay | Admitting: *Deleted

## 2012-05-04 ENCOUNTER — Encounter: Payer: Self-pay | Admitting: Gastroenterology

## 2012-05-04 ENCOUNTER — Ambulatory Visit: Payer: Medicare Other

## 2012-05-04 NOTE — Telephone Encounter (Signed)
Called [facility] [phone] asked to speak with Dr.weoodruff's nurse to ask what result testosterone level drawn on patient recently ,spoke with Angel,RN, they did get a call with testosterone level, but wasn't faxed to their office, per their note from Dr.Woodruf no lupron to be given at this time, thanked her then called [facility] again,spoke with charge nurse [employer],  I requested she fax [place] the results of the testosterone level, she said she would put on MD chart  And when they get in sometime today will fax results,she didn't know what time that would be,gave her our fax#[phone] 11:14 AM

## 2012-05-04 NOTE — Telephone Encounter (Signed)
[facility] SN[facility] asking status on Mr.  Eric Rivers, Per Shanda Bumps, his Primary Md there has put a hold on any further radiation for another 30 days, until he completes physical therapy, occupational therapy,cognitive therapy,speech therapy,Medicare just approved this"asked if patients testosterone level was checked?;Yes, it was normal and faxed results to Dr.Woodruff, no Lupron for now", thanked her for her time, asked to convey to [name] Md, and staff were  Thinking of him, will inform Dr.Manning and Katie,therapist  9:41 AM

## 2012-05-05 ENCOUNTER — Ambulatory Visit: Payer: Medicare Other

## 2012-05-06 ENCOUNTER — Ambulatory Visit: Payer: Medicare Other

## 2012-05-07 ENCOUNTER — Ambulatory Visit: Payer: Medicare Other

## 2012-05-10 ENCOUNTER — Other Ambulatory Visit: Payer: Self-pay | Admitting: *Deleted

## 2012-05-10 ENCOUNTER — Ambulatory Visit: Payer: Medicare Other

## 2012-05-11 ENCOUNTER — Ambulatory Visit (INDEPENDENT_AMBULATORY_CARE_PROVIDER_SITE_OTHER): Payer: Self-pay | Admitting: Surgery

## 2012-05-11 ENCOUNTER — Ambulatory Visit
Admission: RE | Admit: 2012-05-11 | Discharge: 2012-05-11 | Disposition: A | Discharge status: Home / Self Care | Payer: Medicare Other | Source: Ambulatory Visit | Attending: Surgery | Admitting: Surgery

## 2012-05-11 ENCOUNTER — Encounter: Payer: Self-pay | Admitting: Surgery

## 2012-05-11 ENCOUNTER — Ambulatory Visit: Payer: Medicare Other

## 2012-05-11 VITALS — BP 154/90 | HR 90 | Resp 20 | Ht 68.0 in | Wt 240.0 lb

## 2012-05-11 DIAGNOSIS — J90 Pleural effusion, not elsewhere classified: Secondary | ICD-10-CM

## 2012-05-11 DIAGNOSIS — Z9889 Other specified postprocedural states: Secondary | ICD-10-CM

## 2012-05-11 DIAGNOSIS — I350 Nonrheumatic aortic (valve) stenosis: Secondary | ICD-10-CM

## 2012-05-11 DIAGNOSIS — I71019 Dissection of thoracic aorta, unspecified: Secondary | ICD-10-CM

## 2012-05-11 DIAGNOSIS — Z09 Encounter for follow-up examination after completed treatment for conditions other than malignant neoplasm: Secondary | ICD-10-CM

## 2012-05-11 NOTE — Progress Notes (Addendum)
[address]       [place] [address]             [phone]      HPI:  Patient returns for routine postoperative follow-up having undergone repair of an acute type A aortic dissection using a supra-coronary tube graft on 04/15/2012. The patient's early postoperative recovery while in the hospital was notable for a surprisingly smooth, uncomplicated postoperative course. He has a history of chronic atrial fibrillation and I decided not to put him back on Coumadin postoperatively until we are sure that he had some time to heal, to minimize the risk of bleeding. He was discharged to rehabilitation at [facility]. Since hospital discharge the patient reports he has been steadily improving. Over the past few days he has noticed significant improvement in his ability to walk.   Current Outpatient Prescriptions  Medication Sig Dispense Refill  . allopurinol (ZYLOPRIM) 300 MG tablet take 1 tablet by mouth once daily  90 tablet  3  . aspirin EC 325 MG EC tablet Take 1 tablet (325 mg total) by mouth daily.  30 tablet    . atorvastatin (LIPITOR) 20 MG tablet take 1 tablet by mouth once daily  90 tablet  3  . Calcium Carbonate-Vitamin D (CALCIUM + D PO) Take 1 tablet by mouth daily.      . ferrous gluconate (FERGON) 324 MG tablet Take 1 tablet (324 mg total) by mouth 2 (two) times daily with a meal. For one month then stop.  60 tablet  0  . finasteride (PROSCAR) 5 MG tablet Take 5 mg by mouth daily.        . metoprolol tartrate (LOPRESSOR) 25 MG tablet Take 0.5 tablets (12.5 mg total) by mouth 2 (two) times daily.      . Multiple Vitamins-Minerals (ONCOVITE PO) Take by mouth.        . pantoprazole (PROTONIX) 40 MG tablet take 1 tablet by mouth once daily  90 tablet  3  . senna (SENOKOT) 8.6 MG tablet Take 1 tablet by mouth daily.        . sitaGLIPtin (JANUVIA) 100 MG tablet Take 1 tablet (100 mg total) by mouth daily.  30 tablet  0  . Tamsulosin HCl (FLOMAX) 0.4 MG CAPS Take 0.4 mg  by mouth at bedtime. Restarted flomax  03/12/12 after talking with MD      . traMADol (ULTRAM) 50 MG tablet Take 1-2 tablets (50-100 mg total) by mouth every 4 (four) hours as needed for pain.  30 tablet  0  . zolpidem (AMBIEN) 5 MG tablet Take 1 tablet (5 mg total) by mouth at bedtime as needed for sleep.  90 tablet  1  . leuprolide (LUPRON) 30 MG injection Inject 30 mg into the muscle every 4 (four) months. Every 4 months       No current facility-administered medications for this visit.    Physical Exam:  BP 154/90  Pulse 90  Resp 20  Ht 5\' 8"  (1.727 m)  Wt 240 lb (108.863 kg)  BMI 36.5 kg/m2  SpO2 92% He looks well. Cardiac exam shows a regular rate and rhythm with normal S1 and S2. There is no murmur, rub, or gallop. There is a strong radial pulse bilat. Lung exam reveals decreased breath sounds in both lung bases. The chest incision is healing well and sternum is stable. The right infra-clavicular incision is healing well. There is moderate bilateral lower extremity edema to the knee level. He  is wearing TED stockings.  Diagnostic Tests:  RADIOLOGY REPORT*   Clinical Data: History of aortic dissection involving the aortic root, follow-up, cardiac surgery February 2014, short of breath   CHEST - 2 VIEW   Comparison: Portable chest x-ray of 04/19/2012 and CT chest of 04/15/2012   Findings: There are small bilateral pleural effusions present with basilar atelectasis.  Cardiomegaly is stable.  Median sternotomy sutures are noted. Surgical clips overlie the right axilla.  No acute bony abnormality is seen.   IMPRESSION: Bilateral pleural effusions left greater than right with basilar atelectasis.     Original Report Authenticated By: Dwyane Dee, M.D.   Impression:  Overall I think he is making very good progress considering his age. He still has significant volume overload with small bilateral pleural effusions and lower extremity edema. I don't think there is enough  pleural effusion at this time to warrant a thoracentesis. I will start him on Lasix 40 mg daily and potassium chloride 20 mEq daily to try to remove this volume excess. His blood pressure is higher today than I would like it. I will increase his Lopressor to 25 mg twice a day. I'll see him back in about 2 weeks to reassess his volume status and his blood pressure. If his blood pressure remains elevated he will need an ACE inhibitor. I will probably resume his Coumadin at that time. I think he can resume radiation therapy for his prostate cancer when Dr. Kathrynn Running is comfortable with that.  Plan:  He will return to see me in 2 weeks with a chest x-ray.

## 2012-05-12 ENCOUNTER — Ambulatory Visit: Payer: Medicare Other

## 2012-05-13 ENCOUNTER — Ambulatory Visit: Payer: Medicare Other

## 2012-05-14 ENCOUNTER — Ambulatory Visit: Payer: Medicare Other

## 2012-05-17 ENCOUNTER — Ambulatory Visit: Payer: Medicare Other

## 2012-05-18 ENCOUNTER — Ambulatory Visit: Payer: Medicare Other

## 2012-05-24 ENCOUNTER — Other Ambulatory Visit: Payer: Self-pay | Admitting: *Deleted

## 2012-05-24 DIAGNOSIS — I350 Nonrheumatic aortic (valve) stenosis: Secondary | ICD-10-CM

## 2012-05-26 ENCOUNTER — Ambulatory Visit (INDEPENDENT_AMBULATORY_CARE_PROVIDER_SITE_OTHER): Payer: Self-pay | Admitting: Surgery

## 2012-05-26 ENCOUNTER — Encounter: Payer: Self-pay | Admitting: Surgery

## 2012-05-26 ENCOUNTER — Ambulatory Visit
Admission: RE | Admit: 2012-05-26 | Discharge: 2012-05-26 | Disposition: A | Discharge status: Home / Self Care | Payer: Medicare Other | Source: Ambulatory Visit | Attending: Surgery | Admitting: Surgery

## 2012-05-26 VITALS — BP 110/74 | HR 82 | Resp 20 | Ht 68.0 in | Wt 240.0 lb

## 2012-05-26 DIAGNOSIS — I350 Nonrheumatic aortic (valve) stenosis: Secondary | ICD-10-CM

## 2012-05-26 DIAGNOSIS — Z09 Encounter for follow-up examination after completed treatment for conditions other than malignant neoplasm: Secondary | ICD-10-CM

## 2012-05-26 DIAGNOSIS — I7101 Dissection of thoracic aorta: Secondary | ICD-10-CM

## 2012-05-26 DIAGNOSIS — J90 Pleural effusion, not elsewhere classified: Secondary | ICD-10-CM | POA: Diagnosis not present

## 2012-05-26 NOTE — Progress Notes (Signed)
[address]       [place] [address]             [phone]      HPI:  Patient returns for routine postoperative follow-up having undergone repair of an acute type A aortic dissection using a supra-coronary tube graft on 04/15/2012. The patient's early postoperative recovery while in the hospital was notable for a surprisingly smooth, uncomplicated postoperative course. He has a history of chronic atrial fibrillation and I decided not to put him back on Coumadin postoperatively until we are sure that he had some time to heal, to minimize the risk of bleeding. He was discharged to rehabilitation at [facility]. Since hospital discharge the patient reports he has been steadily improving. I saw him in the office about 2 weeks ago and he had some lower extremity edema as well as bilateral pleural effusions. He was treated with a course of Lasix and potassium and has had a good diuresis with resolution of his lower extremtiy edema and near complete resolution of his pleural effusions.  Current Outpatient Prescriptions  Medication Sig Dispense Refill  . allopurinol (ZYLOPRIM) 300 MG tablet take 1 tablet by mouth once daily  90 tablet  3  . aspirin EC 325 MG EC tablet Take 1 tablet (325 mg total) by mouth daily.  30 tablet    . atorvastatin (LIPITOR) 20 MG tablet take 1 tablet by mouth once daily  90 tablet  3  . Calcium Carbonate-Vitamin D (CALCIUM + D PO) Take 1 tablet by mouth daily.      . ferrous gluconate (FERGON) 324 MG tablet Take 1 tablet (324 mg total) by mouth 2 (two) times daily with a meal. For one month then stop.  60 tablet  0  . finasteride (PROSCAR) 5 MG tablet Take 5 mg by mouth daily.        [name] Kitchen leuprolide (LUPRON) 30 MG injection Inject 30 mg into the muscle every 4 (four) months. Every 4 months      . metoprolol tartrate (LOPRESSOR) 25 MG tablet Take 25 mg by mouth 2 (two) times daily.      . Multiple Vitamins-Minerals (ONCOVITE PO) Take by mouth.        .  pantoprazole (PROTONIX) 40 MG tablet take 1 tablet by mouth once daily  90 tablet  3  . senna (SENOKOT) 8.6 MG tablet Take 1 tablet by mouth daily.        . sitaGLIPtin (JANUVIA) 100 MG tablet Take 1 tablet (100 mg total) by mouth daily.  30 tablet  0  . Tamsulosin HCl (FLOMAX) 0.4 MG CAPS Take 0.4 mg by mouth at bedtime. Restarted flomax  03/12/12 after talking with MD      . traMADol (ULTRAM) 50 MG tablet Take 1-2 tablets (50-100 mg total) by mouth every 4 (four) hours as needed for pain.  30 tablet  0  . zolpidem (AMBIEN) 5 MG tablet Take 1 tablet (5 mg total) by mouth at bedtime as needed for sleep.  90 tablet  1   No current facility-administered medications for this visit.     Physical Exam: BP 110/74  Pulse 82  Resp 20  Ht 5\' 8"  (1.727 m)  Wt 240 lb (108.863 kg)  BMI 36.5 kg/m2  SpO2 97% He looks well. Cardiac exam shows an irregular rate and rhythm. There is no murmur. Lung exam is clear. The chest incision is well-healed and sternum is stable. The right infraclavicular incision is  well-healed. There is no peripheral edema.   Diagnostic Tests:  *RADIOLOGY REPORT*   Clinical Data: Cough.  Aortic stenosis.  Heart surgery 1 month ago.   CHEST - 2 VIEW   Comparison: 05/11/2012   Findings: Trace bilateral pleural effusions are improved compared the prior.  Linear opacity in the lingula favors mild atelectasis. Tortuous thoracic aorta observed faint nodularity projecting over the left heart border is likely incidental.   Old healed left proximal humeral fracture noted.  Moderate cardiomegaly noted without edema.  Thoracic spondylosis is present.   IMPRESSION:   1.  Reduced size of pleural effusions.  Trace residual pleural effusions, left greater than right. 2.  Cardiomegaly. 3.  Mild scarring or atelectasis along the lingula. 4.  Tortuous aorta.     Original Report Authenticated By: Gaylyn Rong, M.D.     Impression:  Overall I think he is making good  progress following this major surgery after an acute type A aortic dissection. His blood pressure is under good control now on Lopressor 25 mg twice a day. His volume excess has been resolved with diuretics. He has not been taking any diuretic this week and his weight has been stable. I think he can continue to follow his weight and hopefully will not need chronic diuretics. He does have a history of chronic atrial fibrillation and I have waited to restart his Coumadin. I think this can be restarted at his previous dose of 4 mg per day except Wednesday when he takes 6 mg with his INR followed up in the [place] anticoagulation clinic.I asked him to make a followup appointment with Dr. Myrtis Ser and Dr. Debby Bud.  Plan:  I will plan to see him back in 6 months and we'll do a CT angiogram of the chest and abdomen at that time to reassess his remaining aorta for development of post dissection aneurysm.

## 2012-05-28 ENCOUNTER — Telehealth: Payer: Self-pay

## 2012-05-28 NOTE — Telephone Encounter (Signed)
Spoke with pt. He was calling to re-establish with the Coumadin clinic.  Dr. Laneta Simmers restarted his Coumadin on 3/26.  We set him up for an appt on 4/3 to check INR.  He states he has plenty of Coumadin for now.

## 2012-06-04 ENCOUNTER — Other Ambulatory Visit: Payer: Self-pay | Admitting: General Practice

## 2012-06-04 ENCOUNTER — Ambulatory Visit (INDEPENDENT_AMBULATORY_CARE_PROVIDER_SITE_OTHER): Payer: Medicare Other | Admitting: General Practice

## 2012-06-04 DIAGNOSIS — I4891 Unspecified atrial fibrillation: Secondary | ICD-10-CM | POA: Diagnosis not present

## 2012-06-04 DIAGNOSIS — I2789 Other specified pulmonary heart diseases: Secondary | ICD-10-CM

## 2012-06-04 DIAGNOSIS — I272 Pulmonary hypertension, unspecified: Secondary | ICD-10-CM

## 2012-06-04 DIAGNOSIS — Z7901 Long term (current) use of anticoagulants: Secondary | ICD-10-CM

## 2012-06-04 DIAGNOSIS — I482 Chronic atrial fibrillation, unspecified: Secondary | ICD-10-CM

## 2012-06-04 MED ORDER — WARFARIN SODIUM 4 MG PO TABS: 4.0000 mg | ORAL_TABLET | Freq: Every day | ORAL | Status: DC

## 2012-06-07 ENCOUNTER — Ambulatory Visit (INDEPENDENT_AMBULATORY_CARE_PROVIDER_SITE_OTHER): Payer: Medicare Other | Admitting: Internal Medicine

## 2012-06-07 ENCOUNTER — Encounter: Payer: Self-pay | Admitting: Internal Medicine

## 2012-06-07 VITALS — BP 112/64 | HR 58 | Temp 96.6°F | Resp 16 | Ht 68.0 in | Wt 223.0 lb

## 2012-06-07 DIAGNOSIS — I7101 Dissection of thoracic aorta: Secondary | ICD-10-CM

## 2012-06-07 DIAGNOSIS — I4891 Unspecified atrial fibrillation: Secondary | ICD-10-CM

## 2012-06-07 DIAGNOSIS — C61 Malignant neoplasm of prostate: Secondary | ICD-10-CM

## 2012-06-07 DIAGNOSIS — E119 Type 2 diabetes mellitus without complications: Secondary | ICD-10-CM | POA: Diagnosis not present

## 2012-06-07 DIAGNOSIS — I482 Chronic atrial fibrillation, unspecified: Secondary | ICD-10-CM

## 2012-06-07 NOTE — Progress Notes (Signed)
Subjective:    Patient ID: Eric Rivers, male    DOB: Oct 15, 1930, 79 y.o.   MRN: [id number]  HPI Eric Rivers presents for follow up after a type A aortic dissection with a tube graft. He was at [facility] for 3 weeks and has done very well. He has been home about 3 weeks and is doing well. He is walking around his cul-de-sac. He has seen Dr. Laneta Rivers who will see him back in August. He is going to resume his XRT treatments for prostate cancer on April 15th for 10 sessions. His heart rate has been good and he has been restarted on coumadin.   Past Medical History  Diagnosis Date  . Whooping cough   . Bladder cancer     recurrence with TUR-B March '09  . Atrial fibrillation      Chronic,   24 hour holter, September, 2011.... atrial fib rate is controlled.... there is some bradycardia but  no marked pauses  . Diabetes mellitus     type 2  . Hypertension   . DDD (degenerative disc disease)     lumbar spine  . GERD (gastroesophageal reflux disease)   . Gout   . Fluid overload   . ACE-inhibitor cough   . Shortness of breath     on exertion  . Aortic stenosis     mild....echo... september... 2010/ mild... echo.[name] KitchenMarland KitchenDec, 2011  . Pulmonary hypertension     , echo, 01/2010  . Hyperlipidemia   . Hx of colonoscopy     approx. 10 years  . Warfarin anticoagulation     Atrial fib  . Ejection fraction     EF 60%, echo, 01/2010  . Normal nuclear stress test     06/2005 , also ABI normal 2007  . Hypercholesterolemia   . Fracture of metacarpal bone 11/30/11   Past Surgical History  Procedure Laterality Date  . Lumbar laminectomy      '02  . Tur-bt '06, '09    . Bladder cancer biopsy  10/16/2010    negative for malignancy  . Bladder microscopic  04/13/2007    high grade papillary urothelial lesions  . 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 Prostate biopsy  09/25/2011    GLEASON 3+3=6 AND 4+3=7  . Ascending aortic root replacement N/A 04/15/2012     Procedure: ASCENDING AORTIC ROOT REPLACEMENT;  Surgeon: Eric Borne, MD;  Location: [facility] OR;  Service: Open Heart Surgery;  Laterality: N/A;  . Tee without cardioversion N/A 04/15/2012    Procedure: TRANSESOPHAGEAL ECHOCARDIOGRAM (TEE);  Surgeon: Eric Borne, MD;  Location: [facility] OR;  Service: Open Heart Surgery;  Laterality: N/A;   Family History  Problem Relation Age of Onset  . Prostate cancer Father 74    passed with prostate ca   History   Social History  . Marital Status: Single    Spouse Name: N/A    Number of Children: 0  . Years of Education: 61   Occupational History  . teacher     retired   Social History Main Topics  . Smoking status: Former Smoker -- 1.00 packs/day    Types: Cigarettes    Quit date: 03/04/1975  . Smokeless tobacco: Never Used  . Alcohol Use: 2.5 oz/week    5 drink(s) per week  . Drug Use: No  . Sexually Active: Not Currently   Other Topics Concern  . Not on file   Social History Narrative  Confirmed batchelor. Retired Engineer, site. World traveler- Geologist, engineering. I- ADLS. End of life Care   No CPR, no prolonged intubation, no prolonged artificial hydration or feeding, no heroic or futile measures.       [place]13 [place]          \ Current Outpatient Prescriptions on File Prior to Visit  Medication Sig Dispense Refill  . allopurinol (ZYLOPRIM) 300 MG tablet take 1 tablet by mouth once daily  90 tablet  3  . aspirin EC 325 MG EC tablet Take 1 tablet (325 mg total) by mouth daily.  30 tablet    . atorvastatin (LIPITOR) 20 MG tablet take 1 tablet by mouth once daily  90 tablet  3  . Calcium Carbonate-Vitamin D (CALCIUM + D PO) Take 1 tablet by mouth daily.      . ferrous gluconate (FERGON) 324 MG tablet Take 1 tablet (324 mg total) by mouth 2 (two) times daily with a meal. For one month then stop.  60 tablet  0  . finasteride (PROSCAR) 5 MG tablet Take 5 mg by mouth daily.        [name] Kitchen  leuprolide (LUPRON) 30 MG injection Inject 30 mg into the muscle every 4 (four) months. Every 4 months      . metoprolol tartrate (LOPRESSOR) 25 MG tablet Take 25 mg by mouth 2 (two) times daily.      . Multiple Vitamins-Minerals (ONCOVITE PO) Take by mouth.        . pantoprazole (PROTONIX) 40 MG tablet take 1 tablet by mouth once daily  90 tablet  3  . senna (SENOKOT) 8.6 MG tablet Take 1 tablet by mouth daily.        . sitaGLIPtin (JANUVIA) 100 MG tablet Take 1 tablet (100 mg total) by mouth daily.  30 tablet  0  . Tamsulosin HCl (FLOMAX) 0.4 MG CAPS Take 0.4 mg by mouth at bedtime. Restarted flomax  03/12/12 after talking with MD      . traMADol (ULTRAM) 50 MG tablet Take 1-2 tablets (50-100 mg total) by mouth every 4 (four) hours as needed for pain.  30 tablet  0  . warfarin (COUMADIN) 4 MG tablet Take 1 tablet (4 mg total) by mouth daily. Take as directed by coumadin clinic.  120 tablet  1  . zolpidem (AMBIEN) 5 MG tablet Take 1 tablet (5 mg total) by mouth at bedtime as needed for sleep.  90 tablet  1   No current facility-administered medications on file prior to visit.      Review of Systems System review is negative for any constitutional, cardiac, pulmonary, GI or neuro symptoms or complaints other than as described in the HPI.     Objective:   Physical Exam Filed Vitals:   06/07/12 1442  BP: 112/64  Pulse: 58  Temp: 96.6 F (35.9 C)  Resp: 16   Wt Readings from Last 3 Encounters:  06/07/12 223 lb (101.152 kg)  05/26/12 240 lb (108.863 kg)  05/11/12 240 lb (108.863 kg)   Gen'l - overwieght, white man with pallor HEENT- right EAC clear, left EAC with some cerumen but not impacted. Cor - 2+ radial, Reg rate vs rate controlled A. Fib Rectum - medium sized external hemorrhoid 4-6 o'clock position with signs of thrombosis or bleeding.       Assessment & Plan:  1. External hemorrhoid  Not thrombosed or irritated.  Plan Routine hemorrhoid  care.  2. Derm -  Small  fissure in intertriginous area between buttocks c/w yeast infection  Plan lamisil bid.

## 2012-06-07 NOTE — Patient Instructions (Addendum)
You look like you are doing well.  No ear wax blockage but a little build up on the left  In the intriginous space between the buttocks there is a small superficial fissure from yeast infection . Plan - lamisil twice a day after wash and dry.  There is a medium sized external hemorrhoidal bulge at the 4-6 o'clock position. Plan Do not strain at stool.

## 2012-06-08 NOTE — Assessment & Plan Note (Signed)
Lab Results  Component Value Date   HGBA1C 6.1 02/24/2012    Great control. Due for follow up lab in July '14

## 2012-06-08 NOTE — Assessment & Plan Note (Signed)
He had completed 30/40 XRT treatments prior to his dissection.  PLan He is to resume XRT 10 treatment April 14th.

## 2012-06-08 NOTE — Assessment & Plan Note (Signed)
Seems to have made a good recovery with some way to go yet in regard to regaining full strength.  Plan continue to increase physical activity as tolerated.  F/u with Cr. Bartle as instructed

## 2012-06-08 NOTE — Assessment & Plan Note (Signed)
Holding a stable rate: sinus vs very controlled a. Fib.  Plan Continue present regimen  F/u with Dr. Myrtis Ser as instructed

## 2012-06-15 ENCOUNTER — Ambulatory Visit (INDEPENDENT_AMBULATORY_CARE_PROVIDER_SITE_OTHER): Payer: Medicare Other | Admitting: General Practice

## 2012-06-15 ENCOUNTER — Ambulatory Visit
Admission: RE | Admit: 2012-06-15 | Discharge: 2012-06-15 | Disposition: A | Discharge status: Home / Self Care | Payer: Medicare Other | Source: Ambulatory Visit | Attending: Radiation Oncology | Admitting: Radiation Oncology

## 2012-06-15 DIAGNOSIS — C61 Malignant neoplasm of prostate: Secondary | ICD-10-CM | POA: Insufficient documentation

## 2012-06-15 DIAGNOSIS — I4891 Unspecified atrial fibrillation: Secondary | ICD-10-CM | POA: Diagnosis not present

## 2012-06-15 DIAGNOSIS — I2789 Other specified pulmonary heart diseases: Secondary | ICD-10-CM

## 2012-06-15 DIAGNOSIS — I482 Chronic atrial fibrillation, unspecified: Secondary | ICD-10-CM

## 2012-06-15 DIAGNOSIS — I272 Pulmonary hypertension, unspecified: Secondary | ICD-10-CM

## 2012-06-15 DIAGNOSIS — Z510 Encounter for antineoplastic radiation therapy: Secondary | ICD-10-CM | POA: Insufficient documentation

## 2012-06-15 DIAGNOSIS — Z7901 Long term (current) use of anticoagulants: Secondary | ICD-10-CM

## 2012-06-16 ENCOUNTER — Ambulatory Visit
Admission: RE | Admit: 2012-06-16 | Discharge: 2012-06-16 | Disposition: A | Discharge status: Home / Self Care | Payer: Medicare Other | Source: Ambulatory Visit | Attending: Radiation Oncology | Admitting: Radiation Oncology

## 2012-06-16 DIAGNOSIS — C61 Malignant neoplasm of prostate: Secondary | ICD-10-CM | POA: Diagnosis not present

## 2012-06-16 DIAGNOSIS — Z510 Encounter for antineoplastic radiation therapy: Secondary | ICD-10-CM | POA: Diagnosis not present

## 2012-06-17 ENCOUNTER — Ambulatory Visit
Admission: RE | Admit: 2012-06-17 | Discharge: 2012-06-17 | Disposition: A | Discharge status: Home / Self Care | Payer: Medicare Other | Source: Ambulatory Visit | Attending: Radiation Oncology | Admitting: Radiation Oncology

## 2012-06-17 DIAGNOSIS — Z510 Encounter for antineoplastic radiation therapy: Secondary | ICD-10-CM | POA: Diagnosis not present

## 2012-06-17 DIAGNOSIS — C61 Malignant neoplasm of prostate: Secondary | ICD-10-CM | POA: Diagnosis not present

## 2012-06-18 ENCOUNTER — Ambulatory Visit
Admission: RE | Admit: 2012-06-18 | Discharge: 2012-06-18 | Disposition: A | Discharge status: Home / Self Care | Payer: Medicare Other | Source: Ambulatory Visit | Attending: Radiation Oncology | Admitting: Radiation Oncology

## 2012-06-18 ENCOUNTER — Encounter: Payer: Self-pay | Admitting: Radiation Oncology

## 2012-06-18 VITALS — BP 151/73 | HR 62 | Temp 97.3°F | Resp 20 | Wt 222.3 lb

## 2012-06-18 DIAGNOSIS — C61 Malignant neoplasm of prostate: Secondary | ICD-10-CM | POA: Diagnosis not present

## 2012-06-18 DIAGNOSIS — Z510 Encounter for antineoplastic radiation therapy: Secondary | ICD-10-CM | POA: Diagnosis not present

## 2012-06-18 NOTE — Progress Notes (Signed)
Pt denies pain, loss of appetite. He states energy level improving since surgery several weeks ago. Pt denies urinary, bowel issues.

## 2012-06-18 NOTE — Progress Notes (Signed)
Department of Radiation Oncology  Phone:  [phone] Fax:        [phone]  Weekly Treatment Note    Name: Eric Rivers Date: 06/18/2012 MRN: [id number] DOB: [birth year]   Current dose: 66.3 Gy  Current fraction: 34   MEDICATIONS: Current Outpatient Prescriptions  Medication Sig Dispense Refill  . allopurinol (ZYLOPRIM) 300 MG tablet take 1 tablet by mouth once daily  90 tablet  3  . aspirin EC 325 MG EC tablet Take 1 tablet (325 mg total) by mouth daily.  30 tablet    . atorvastatin (LIPITOR) 20 MG tablet take 1 tablet by mouth once daily  90 tablet  3  . Calcium Carbonate-Vitamin D (CALCIUM + D PO) Take 1 tablet by mouth daily.      . ferrous gluconate (FERGON) 324 MG tablet Take 1 tablet (324 mg total) by mouth 2 (two) times daily with a meal. For one month then stop.  60 tablet  0  . finasteride (PROSCAR) 5 MG tablet Take 5 mg by mouth daily.        [name] Kitchen leuprolide (LUPRON) 30 MG injection Inject 30 mg into the muscle every 4 (four) months. Every 4 months      . metoprolol tartrate (LOPRESSOR) 25 MG tablet Take 25 mg by mouth 2 (two) times daily.      . Multiple Vitamins-Minerals (ONCOVITE PO) Take by mouth.        . pantoprazole (PROTONIX) 40 MG tablet take 1 tablet by mouth once daily  90 tablet  3  . senna (SENOKOT) 8.6 MG tablet Take 1 tablet by mouth daily.        . sitaGLIPtin (JANUVIA) 100 MG tablet Take 1 tablet (100 mg total) by mouth daily.  30 tablet  0  . Tamsulosin HCl (FLOMAX) 0.4 MG CAPS Take 0.4 mg by mouth at bedtime. Restarted flomax  03/12/12 after talking with MD      . traMADol (ULTRAM) 50 MG tablet Take 1-2 tablets (50-100 mg total) by mouth every 4 (four) hours as needed for pain.  30 tablet  0  . warfarin (COUMADIN) 4 MG tablet Take 1 tablet (4 mg total) by mouth daily. Take as directed by coumadin clinic.  120 tablet  1  . zolpidem (AMBIEN) 5 MG tablet Take 1 tablet (5 mg total) by mouth at bedtime as needed for sleep.  90 tablet  1   No  current facility-administered medications for this encounter.     ALLERGIES: Review of patient's allergies indicates no known allergies.   LABORATORY DATA:  Lab Results  Component Value Date   WBC 10.7* 04/20/2012   HGB 8.1* 04/20/2012   HCT 24.2* 04/20/2012   MCV 92.4 04/20/2012   PLT 125* 04/20/2012   Lab Results  Component Value Date   NA 143 04/20/2012   K 4.1 04/20/2012   CL 104 04/20/2012   CO2 33* 04/20/2012   Lab Results  Component Value Date   ALT 318* 04/19/2012   AST 172* 04/19/2012   ALKPHOS 67 04/19/2012   BILITOT 1.1 04/19/2012     NARRATIVE: Eric Rivers was seen today for weekly treatment management. The chart was checked and the patient's films were reviewed. The patient indicates that he is doing well. He has restarted his treatment after a long break due to other medical no problems. No GU symptoms currently.   PHYSICAL EXAMINATION: weight is 222 lb 4.8 oz (100.835 kg). His oral temperature is 97.3 F (36.3  C). His blood pressure is 151/73 and his pulse is 62. His respiration is 20.        ASSESSMENT: The patient is doing satisfactorily with treatment.  PLAN: We will continue with the patient's radiation treatment as planned.

## 2012-06-21 ENCOUNTER — Ambulatory Visit
Admission: RE | Admit: 2012-06-21 | Discharge: 2012-06-21 | Disposition: A | Discharge status: Home / Self Care | Payer: Medicare Other | Source: Ambulatory Visit | Attending: Radiation Oncology | Admitting: Radiation Oncology

## 2012-06-21 DIAGNOSIS — Z510 Encounter for antineoplastic radiation therapy: Secondary | ICD-10-CM | POA: Diagnosis not present

## 2012-06-21 DIAGNOSIS — C61 Malignant neoplasm of prostate: Secondary | ICD-10-CM | POA: Diagnosis not present

## 2012-06-22 ENCOUNTER — Ambulatory Visit
Admission: RE | Admit: 2012-06-22 | Discharge: 2012-06-22 | Disposition: A | Discharge status: Home / Self Care | Payer: Medicare Other | Source: Ambulatory Visit | Attending: Radiation Oncology | Admitting: Radiation Oncology

## 2012-06-22 DIAGNOSIS — C61 Malignant neoplasm of prostate: Secondary | ICD-10-CM | POA: Diagnosis not present

## 2012-06-22 DIAGNOSIS — Z510 Encounter for antineoplastic radiation therapy: Secondary | ICD-10-CM | POA: Diagnosis not present

## 2012-06-23 ENCOUNTER — Ambulatory Visit
Admission: RE | Admit: 2012-06-23 | Discharge: 2012-06-23 | Disposition: A | Discharge status: Home / Self Care | Payer: Medicare Other | Source: Ambulatory Visit | Attending: Radiation Oncology | Admitting: Radiation Oncology

## 2012-06-23 ENCOUNTER — Encounter: Payer: Self-pay | Admitting: Radiation Oncology

## 2012-06-23 VITALS — BP 179/88 | HR 62 | Temp 97.6°F | Resp 20 | Wt 220.9 lb

## 2012-06-23 DIAGNOSIS — C61 Malignant neoplasm of prostate: Secondary | ICD-10-CM | POA: Diagnosis not present

## 2012-06-23 DIAGNOSIS — Z510 Encounter for antineoplastic radiation therapy: Secondary | ICD-10-CM | POA: Diagnosis not present

## 2012-06-23 NOTE — Progress Notes (Signed)
Weekly rad tx prostate 37/40 completed, nocturia 2-3x, more urgency, to void, weak stream to a dribble stated, bowels regular,appetite good, is fatigued, 99% room air, had aortic disection in February 13th, at [facility] cone, Dr. Evelene Croon, ,

## 2012-06-23 NOTE — Progress Notes (Signed)
  Radiation Oncology         [phone]) [phone] ________________________________  Name: Eric Rivers MRN: [id number]  Date: 06/23/2012  DOB: 05/30/1930  Weekly Radiation Therapy Management  Current Dose: 72.15 Gy     Planned Dose:  78 Gy  Narrative . . . . . . . . The patient presents for routine under treatment assessment.                                                    The patient is without complaint.                                 Set-up films were reviewed.                                 The chart was checked. Physical Findings. . .  weight is 220 lb 14.4 oz (100.2 kg). His oral temperature is 97.6 F (36.4 C). His blood pressure is 179/88 and his pulse is 62. His respiration is 20 and oxygen saturation is 99%. . Weight essentially stable.  No significant changes. Impression . . . . . . . The patient is tolerating radiation. Plan . . . . . . . . . . . . Continue treatment as planned.  He will complete radiation on Monday, April 28th and I'll see him back for follow-up one month later.  ________________________________  Artist Pais. Kathrynn Running, M.D.

## 2012-06-24 ENCOUNTER — Ambulatory Visit
Admission: RE | Admit: 2012-06-24 | Discharge: 2012-06-24 | Disposition: A | Discharge status: Home / Self Care | Payer: Medicare Other | Source: Ambulatory Visit | Attending: Radiation Oncology | Admitting: Radiation Oncology

## 2012-06-24 DIAGNOSIS — C61 Malignant neoplasm of prostate: Secondary | ICD-10-CM | POA: Diagnosis not present

## 2012-06-24 DIAGNOSIS — Z510 Encounter for antineoplastic radiation therapy: Secondary | ICD-10-CM | POA: Diagnosis not present

## 2012-06-25 ENCOUNTER — Ambulatory Visit
Admission: RE | Admit: 2012-06-25 | Discharge: 2012-06-25 | Disposition: A | Discharge status: Home / Self Care | Payer: Medicare Other | Source: Ambulatory Visit | Attending: Radiation Oncology | Admitting: Radiation Oncology

## 2012-06-25 DIAGNOSIS — C61 Malignant neoplasm of prostate: Secondary | ICD-10-CM | POA: Diagnosis not present

## 2012-06-25 DIAGNOSIS — Z510 Encounter for antineoplastic radiation therapy: Secondary | ICD-10-CM | POA: Diagnosis not present

## 2012-06-28 ENCOUNTER — Ambulatory Visit
Admission: RE | Admit: 2012-06-28 | Discharge: 2012-06-28 | Disposition: A | Discharge status: Home / Self Care | Payer: Medicare Other | Source: Ambulatory Visit | Attending: Radiation Oncology | Admitting: Radiation Oncology

## 2012-06-28 ENCOUNTER — Ambulatory Visit: Payer: Medicare Other

## 2012-06-28 ENCOUNTER — Encounter: Payer: Self-pay | Admitting: Radiation Oncology

## 2012-06-28 VITALS — BP 186/78 | HR 60 | Temp 97.1°F | Resp 20 | Wt 221.7 lb

## 2012-06-28 DIAGNOSIS — C61 Malignant neoplasm of prostate: Secondary | ICD-10-CM | POA: Diagnosis not present

## 2012-06-28 DIAGNOSIS — Z510 Encounter for antineoplastic radiation therapy: Secondary | ICD-10-CM | POA: Diagnosis not present

## 2012-06-29 NOTE — Progress Notes (Signed)
  Radiation Oncology         [phone]) [phone] ________________________________  Name: Eric Rivers MRN: [id number]  Date: 06/28/2012  DOB: [birth year]  End of Treatment Note  Diagnosis:   79 year old man with adenocarcinoma of the prostate with a Gleason's score of 4+3 and a PSA of 3.17(on Proscar)  Indication for treatment:  Curative, intensity modulated radiotherapy with androgen deprivation       Radiation treatment dates:   03/04/2012-06/28/2012  Site/dose:   The prostate was treated to 78 gray in 40 fractions of 1.95 gray  Beams/energy:   The patient was immobilized with a body fix pillow, custom-shaped for precise immobilization and daily. The patient underwent cone beam CT prior to each fraction to localize the 3 gold markers within the prostate for treatment positioning and to verify proper bladder and rectal filling. The patient's treatment was delivered with 6 megavolt photons using volumetric ARC therapy.  Narrative: The patient tolerated radiation treatment relatively well.   He did experience a prolonged break after his 30th fraction on February 12, prior to returning on April 15. This break was the result of a thoracic aorta dissection requiring emergent repair followed by convalescence in a skilled nursing facility. The patient made an apparent complete recovery and completed treatment as planned. The prolonged treatment break was felt to be of limited consequence given the patient's androgen deprivation causing his prostate cancer to be dormant. To verify this, the patient's testosterone was checked during his treatment break and confirmed to remain at castrate levels.  Plan: The patient has completed radiation treatment. The patient will return to radiation oncology clinic for routine followup in one month. I advised him to call or return sooner if he has any questions or concerns related to his recovery or treatment. ________________________________  Artist Pais. Kathrynn Running,  M.D.

## 2012-06-30 DIAGNOSIS — C61 Malignant neoplasm of prostate: Secondary | ICD-10-CM | POA: Diagnosis not present

## 2012-07-01 DIAGNOSIS — E1159 Type 2 diabetes mellitus with other circulatory complications: Secondary | ICD-10-CM | POA: Diagnosis not present

## 2012-07-01 DIAGNOSIS — I739 Peripheral vascular disease, unspecified: Secondary | ICD-10-CM | POA: Diagnosis not present

## 2012-07-01 DIAGNOSIS — C61 Malignant neoplasm of prostate: Secondary | ICD-10-CM | POA: Diagnosis not present

## 2012-07-01 DIAGNOSIS — L608 Other nail disorders: Secondary | ICD-10-CM | POA: Diagnosis not present

## 2012-07-06 ENCOUNTER — Ambulatory Visit (INDEPENDENT_AMBULATORY_CARE_PROVIDER_SITE_OTHER): Payer: Medicare Other | Admitting: General Practice

## 2012-07-06 DIAGNOSIS — I2789 Other specified pulmonary heart diseases: Secondary | ICD-10-CM | POA: Diagnosis not present

## 2012-07-06 DIAGNOSIS — I4891 Unspecified atrial fibrillation: Secondary | ICD-10-CM

## 2012-07-06 DIAGNOSIS — I482 Chronic atrial fibrillation, unspecified: Secondary | ICD-10-CM

## 2012-07-06 DIAGNOSIS — Z7901 Long term (current) use of anticoagulants: Secondary | ICD-10-CM | POA: Diagnosis not present

## 2012-07-06 DIAGNOSIS — C61 Malignant neoplasm of prostate: Secondary | ICD-10-CM | POA: Diagnosis not present

## 2012-07-06 DIAGNOSIS — I272 Pulmonary hypertension, unspecified: Secondary | ICD-10-CM

## 2012-07-06 LAB — POCT INR: INR: 2.4

## 2012-07-18 ENCOUNTER — Encounter: Payer: Self-pay | Admitting: Radiation Oncology

## 2012-07-29 ENCOUNTER — Ambulatory Visit
Admission: RE | Admit: 2012-07-29 | Discharge: 2012-07-29 | Disposition: A | Discharge status: Home / Self Care | Payer: Medicare Other | Source: Ambulatory Visit | Attending: Radiation Oncology | Admitting: Radiation Oncology

## 2012-07-29 ENCOUNTER — Encounter: Payer: Self-pay | Admitting: Radiation Oncology

## 2012-07-29 VITALS — BP 180/105 | HR 60 | Temp 97.0°F | Resp 16 | Wt 222.1 lb

## 2012-07-29 DIAGNOSIS — C61 Malignant neoplasm of prostate: Secondary | ICD-10-CM

## 2012-07-29 NOTE — Progress Notes (Signed)
Radiation Oncology         [phone]) [phone] ________________________________  Name: Eric Rivers MRN: [id number]  Date: 07/29/2012  DOB: 12-Dec-1930  Follow-Up Visit Note  CC: Illene Regulus, MD  Milford Cage,*  Diagnosis:   79 year old man with adenocarcinoma of the prostate with a Gleason's score of 4+3 and a PSA of 3.17(on Proscar) s/p curative, intensity modulated radiotherapy with androgen deprivation 03/04/2012-06/28/2012 to 78 gray  Interval Since Last Radiation:  1  months  Narrative:  The patient returns today for routine follow-up.  Patient describes urine stream alternates between weak and a dribble. Patient reports urgency and frequency. Patient reports getting up during the night 4-5 times to void. Denies burning with urination or hematuria. Patient reports regular bowels movements without pain or blood. Last Lupron injection done June 30, 2012. Follow up with Dr. Margarita Grizzle 08/2012 repeat PSA and Testerone. Patient had open heart surgery in February. Suffers from shortness of breath, elevated bp, dry cough and difficulty sleeping due to breathing difficulties.                              ALLERGIES:  has No Known Allergies.  Meds: Current Outpatient Prescriptions  Medication Sig Dispense Refill  . acetaminophen (TYLENOL) 325 MG tablet Take 650 mg by mouth every 6 (six) hours as needed for pain.      [name] Kitchen allopurinol (ZYLOPRIM) 300 MG tablet take 1 tablet by mouth once daily  90 tablet  3  . aspirin EC 325 MG EC tablet Take 1 tablet (325 mg total) by mouth daily.  30 tablet    . atorvastatin (LIPITOR) 20 MG tablet take 1 tablet by mouth once daily  90 tablet  3  . Calcium Carbonate-Vitamin D (CALCIUM + D PO) Take 1 tablet by mouth daily.      . finasteride (PROSCAR) 5 MG tablet Take 5 mg by mouth daily.        [name] Kitchen leuprolide (LUPRON) 30 MG injection Inject 30 mg into the muscle every 4 (four) months. Every 4 months      . metoprolol tartrate (LOPRESSOR) 25 MG tablet Take 25  mg by mouth 2 (two) times daily.      . Multiple Vitamins-Minerals (ONCOVITE PO) Take by mouth.        . pantoprazole (PROTONIX) 40 MG tablet take 1 tablet by mouth once daily  90 tablet  3  . sitaGLIPtin (JANUVIA) 100 MG tablet Take 1 tablet (100 mg total) by mouth daily.  30 tablet  0  . Tamsulosin HCl (FLOMAX) 0.4 MG CAPS Take 0.4 mg by mouth at bedtime. Restarted flomax  03/12/12 after talking with MD      . warfarin (COUMADIN) 4 MG tablet Take 1 tablet (4 mg total) by mouth daily. Take as directed by coumadin clinic.  120 tablet  1  . zolpidem (AMBIEN) 5 MG tablet Take 1 tablet (5 mg total) by mouth at bedtime as needed for sleep.  90 tablet  1  . senna (SENOKOT) 8.6 MG tablet Take 1 tablet by mouth daily.        . traMADol (ULTRAM) 50 MG tablet Take 1-2 tablets (50-100 mg total) by mouth every 4 (four) hours as needed for pain.  30 tablet  0   No current facility-administered medications for this encounter.    Physical Findings: The patient is in no acute distress. Patient is alert and oriented.  weight  is 222 lb 1.6 oz (100.744 kg). His oral temperature is 97 F (36.1 C). His blood pressure is 180/105 and his pulse is 60. His respiration is 16 and oxygen saturation is 98%. .  No significant changes.  Impression:  The patient is recovering from the effects of radiation.    Plan:  He will continue to follow-up with urology for ongoing PSA determinations.  I will look forward to following his response through their correspondence, and be happy to participate in care if clinically indicated.  I talked to the patient about what to expect in the future, including his risk for erectile dysfunction and rectal bleeding.  I encouraged him to call or return to the office if he has any question about his previous radiation or possible radiation effects.  He was comfortable with this plan.  _____________________________________  Artist Pais. Kathrynn Running, M.D.

## 2012-07-29 NOTE — Progress Notes (Signed)
Patient describes urine stream alternates between weak and a dribble. Patient reports urgency and frequency. Patient reports getting up during the night 4-5 times to void. Denies burning with urination or hematuria. Patient reports regular bowels movements without pain or blood. Last Lupron injection done June 30, 2012. Follow up with Dr. Margarita Grizzle 08/2012 repeat PSA and Testerone. Patient had open heart surgery in February. Suffers from shortness of breath, elevated bp, dry cough and difficulty sleeping due to breathing difficulties. Encouraged patient to follow up with cardiologist reference these issues.

## 2012-08-03 ENCOUNTER — Ambulatory Visit (INDEPENDENT_AMBULATORY_CARE_PROVIDER_SITE_OTHER): Payer: Medicare Other | Admitting: Family Medicine

## 2012-08-03 DIAGNOSIS — Z7901 Long term (current) use of anticoagulants: Secondary | ICD-10-CM | POA: Diagnosis not present

## 2012-08-03 DIAGNOSIS — I4891 Unspecified atrial fibrillation: Secondary | ICD-10-CM

## 2012-08-03 DIAGNOSIS — I482 Chronic atrial fibrillation, unspecified: Secondary | ICD-10-CM

## 2012-08-03 DIAGNOSIS — I272 Pulmonary hypertension, unspecified: Secondary | ICD-10-CM

## 2012-08-03 DIAGNOSIS — I2789 Other specified pulmonary heart diseases: Secondary | ICD-10-CM

## 2012-08-03 LAB — POCT INR: INR: 2.3

## 2012-08-07 ENCOUNTER — Other Ambulatory Visit: Payer: Self-pay | Admitting: Internal Medicine

## 2012-08-11 ENCOUNTER — Encounter: Payer: Self-pay | Admitting: Physician Assistant

## 2012-08-11 ENCOUNTER — Ambulatory Visit (INDEPENDENT_AMBULATORY_CARE_PROVIDER_SITE_OTHER): Payer: Medicare Other | Admitting: Physician Assistant

## 2012-08-11 VITALS — BP 164/96 | HR 62 | Ht 68.0 in | Wt 216.2 lb

## 2012-08-11 DIAGNOSIS — I359 Nonrheumatic aortic valve disorder, unspecified: Secondary | ICD-10-CM | POA: Diagnosis not present

## 2012-08-11 DIAGNOSIS — I10 Essential (primary) hypertension: Secondary | ICD-10-CM | POA: Diagnosis not present

## 2012-08-11 DIAGNOSIS — N289 Disorder of kidney and ureter, unspecified: Secondary | ICD-10-CM

## 2012-08-11 DIAGNOSIS — I4891 Unspecified atrial fibrillation: Secondary | ICD-10-CM

## 2012-08-11 DIAGNOSIS — I482 Chronic atrial fibrillation, unspecified: Secondary | ICD-10-CM

## 2012-08-11 DIAGNOSIS — I350 Nonrheumatic aortic (valve) stenosis: Secondary | ICD-10-CM

## 2012-08-11 MED ORDER — POTASSIUM CHLORIDE CRYS ER 20 MEQ PO TBCR: 10.0000 meq | EXTENDED_RELEASE_TABLET | Freq: Every day | ORAL | Status: DC

## 2012-08-11 MED ORDER — FUROSEMIDE 40 MG PO TABS: 20.0000 mg | ORAL_TABLET | Freq: Every day | ORAL | Status: DC

## 2012-08-11 NOTE — Patient Instructions (Signed)
Your physician has recommended you make the following change in your medication:  Start Lasix ( 40 mg ) one half tablet daily  Start K-dur ( 20 mg ) one half tablet daily  Your physician recommends that you return for lab work in: 2 weeks on Wednesday, June 25th at our office you just walk in and tell them you are here for lab: bmet  Your physician recommends that you keep your follow-up appointment with Dr. Myrtis Ser on Thursday, September 4th @ 3:45  Your physician has requested that you have an echocardiogram for Aortic Stenosis. Echocardiography is a painless test that uses sound waves to create images of your heart. It provides your doctor with information about the size and shape of your heart and how well your heart's chambers and valves are working. This procedure takes approximately one hour. There are no restrictions for this procedure.   2 Gram Low Sodium Diet A 2 gram sodium diet restricts the amount of sodium in the diet to no more than 2 g or 2000 mg daily. Limiting the amount of sodium is often used to help lower blood pressure. It is important if you have heart, liver, or kidney problems. Many foods contain sodium for flavor and sometimes as a preservative. When the amount of sodium in a diet needs to be low, it is important to know what to look for when choosing foods and drinks. The following includes some information and guidelines to help make it easier for you to adapt to a low sodium diet. QUICK TIPS  Do not add salt to food.  Avoid convenience items and fast food.  Choose unsalted snack foods.  Buy lower sodium products, often labeled as "lower sodium" or "no salt added."  Check food labels to learn how much sodium is in 1 serving.  When eating at a restaurant, ask that your food be prepared with less salt or none, if possible. READING FOOD LABELS FOR SODIUM INFORMATION The nutrition facts label is a good place to find how much sodium is in foods. Look for products with  no more than 500 to 600 mg of sodium per meal and no more than 150 mg per serving. Remember that 2 g = 2000 mg. The food label may also list foods as:  Sodium-free: Less than 5 mg in a serving.  Very low sodium: 35 mg or less in a serving.  Low-sodium: 140 mg or less in a serving.  Light in sodium: 50% less sodium in a serving. For example, if a food that usually has 300 mg of sodium is changed to become light in sodium, it will have 150 mg of sodium.  Reduced sodium: 25% less sodium in a serving. For example, if a food that usually has 400 mg of sodium is changed to reduced sodium, it will have 300 mg of sodium. CHOOSING FOODS Grains  Avoid: Salted crackers and snack items. Some cereals, including instant hot cereals. Bread stuffing and biscuit mixes. Seasoned rice or pasta mixes.  Choose: Unsalted snack items. Low-sodium cereals, oats, puffed wheat and rice, shredded wheat. English muffins and bread. Pasta. Meats  Avoid: Salted, canned, smoked, spiced, pickled meats, including fish and poultry. Bacon, ham, sausage, cold cuts, hot dogs, anchovies.  Choose: Low-sodium canned tuna and salmon. Fresh or frozen meat, poultry, and fish. Dairy  Avoid: Processed cheese and spreads. Cottage cheese. Buttermilk and condensed milk. Regular cheese.  Choose: Milk. Low-sodium cottage cheese. Yogurt. Sour cream. Low-sodium cheese. Fruits and Vegetables  Avoid:  Regular canned vegetables. Regular canned tomato sauce and paste. Frozen vegetables in sauces. Olives. Rosita Fire. Relishes. Sauerkraut.  Choose: Low-sodium canned vegetables. Low-sodium tomato sauce and paste. Frozen or fresh vegetables. Fresh and frozen fruit. Condiments  Avoid: Canned and packaged gravies. Worcestershire sauce. Tartar sauce. Barbecue sauce. Soy sauce. Steak sauce. Ketchup. Onion, garlic, and table salt. Meat flavorings and tenderizers.  Choose: Fresh and dried herbs and spices. Low-sodium varieties of mustard and  ketchup. Lemon juice. Tabasco sauce. Horseradish. SAMPLE 2 GRAM SODIUM MEAL PLAN Breakfast / Sodium (mg)  1 cup low-fat milk / 143 mg  2 slices whole-wheat toast / 270 mg  1 tbs heart-healthy margarine / 153 mg  1 hard-boiled egg / 139 mg  1 small orange / 0 mg Lunch / Sodium (mg)  1 cup raw carrots / 76 mg   cup hummus / 298 mg  1 cup low-fat milk / 143 mg   cup red grapes / 2 mg  1 whole-wheat pita bread / 356 mg Dinner / Sodium (mg)  1 cup whole-wheat pasta / 2 mg  1 cup low-sodium tomato sauce / 73 mg  3 oz lean ground beef / 57 mg  1 small side salad (1 cup raw spinach leaves,  cup cucumber,  cup yellow bell pepper) with 1 tsp olive oil and 1 tsp red wine vinegar / 25 mg Snack / Sodium (mg)  1 container low-fat vanilla yogurt / 107 mg  3 graham cracker squares / 127 mg Nutrient Analysis  Calories: 2033  Protein: 77 g  Carbohydrate: 282 g  Fat: 72 g  Sodium: 1971 mg Document Released: 02/17/2005 Document Revised: 05/12/2011 Document Reviewed: 05/21/2009 [employer] Patient Information 2014 [place], [place].

## 2012-08-11 NOTE — Assessment & Plan Note (Signed)
Patient with hx of mild AS and now DOE. Will recheck 2Decho

## 2012-08-11 NOTE — Assessment & Plan Note (Signed)
BP up. Resume lasix 20mg  daily Kdur daily. May need higher dose. Patient to monitor BP and call if it remains elevated.

## 2012-08-11 NOTE — Progress Notes (Signed)
HPI:   This is an 79 year old white male patient of Dr. Myrtis Ser who has a history of Chronic atrial fibrillation, mild aortic stenosis, Chronic Coumadin, and HTN. He had a surgery for Type A Aortic dissection 04/2012. He last saw Dr. Myrtis Ser 06/2011. While in the hospital his Verapamil was changed to Lopressor and his Lasix and potassium were stopped. He's noticed his BP has been high recently. He also complains of mild dyspnea on exertion. He also complains of nonproductive cough after he eats. He does have history of GERD. His last 2 Decho was in 2011.  Overall he has recovered pretty well from his surgery other than the above complaints. He is slowly getting his energy back.  No Known Allergies  Current Outpatient Prescriptions on File Prior to Visit: acetaminophen (TYLENOL) 325 MG tablet, Take 650 mg by mouth every 6 (six) hours as needed for pain., Disp: , Rfl:  allopurinol (ZYLOPRIM) 300 MG tablet, take 1 tablet by mouth once daily, Disp: 90 tablet, Rfl: 3 aspirin EC 325 MG EC tablet, Take 1 tablet (325 mg total) by mouth daily., Disp: 30 tablet, Rfl:  atorvastatin (LIPITOR) 20 MG tablet, take 1 tablet by mouth once daily, Disp: 90 tablet, Rfl: 3 Calcium Carbonate-Vitamin D (CALCIUM + D PO), Take 1 tablet by mouth daily., Disp: , Rfl:  finasteride (PROSCAR) 5 MG tablet, Take 5 mg by mouth daily.  , Disp: , Rfl:  leuprolide (LUPRON) 30 MG injection, Inject 30 mg into the muscle every 4 (four) months. Every 4 months, Disp: , Rfl:  metoprolol tartrate (LOPRESSOR) 25 MG tablet, take 1 tablet by mouth twice a day, Disp: 180 tablet, Rfl: 0 Multiple Vitamins-Minerals (ONCOVITE PO), Take by mouth.  , Disp: , Rfl:  pantoprazole (PROTONIX) 40 MG tablet, take 1 tablet by mouth once daily, Disp: 90 tablet, Rfl: 3 senna (SENOKOT) 8.6 MG tablet, Take 1 tablet by mouth daily.  , Disp: , Rfl:  sitaGLIPtin (JANUVIA) 100 MG tablet, Take 1 tablet (100 mg total) by mouth daily., Disp: 30 tablet, Rfl: 0 Tamsulosin HCl  (FLOMAX) 0.4 MG CAPS, Take 0.4 mg by mouth at bedtime. Restarted flomax  03/12/12 after talking with MD, Disp: , Rfl:  warfarin (COUMADIN) 4 MG tablet, Take 1 tablet (4 mg total) by mouth daily. Take as directed by coumadin clinic., Disp: 120 tablet, Rfl: 1 zolpidem (AMBIEN) 5 MG tablet, Take 1 tablet (5 mg total) by mouth at bedtime as needed for sleep., Disp: 90 tablet, Rfl: 1  No current facility-administered medications on file prior to visit.   Past Medical History:   Whooping cough                                               Bladder cancer                                                 Comment:recurrence with TUR-B March '09   Atrial fibrillation                                            Comment: Chronic,   24  hour holter, September, 2011....              atrial fib rate is controlled.... there is some              bradycardia but  no marked pauses   Diabetes mellitus                                              Comment:type 2   Hypertension                                                 DDD (degenerative disc disease)                                Comment:lumbar spine   GERD (gastroesophageal reflux disease)                       Gout                                                         Fluid overload                                               ACE-inhibitor cough                                          Shortness of breath                                            Comment:on exertion   Aortic stenosis                                                Comment:mild....echo... september... 2010/ mild...               echo.[name] KitchenMarland KitchenDec, 2011   Pulmonary hypertension                                         Comment:58mmHg, echo, 01/2010   Hyperlipidemia                                               Hx of colonoscopy  Comment:approx. 10 years   Warfarin anticoagulation                                       Comment:Atrial fib   Ejection  fraction                                              Comment:EF 60%, echo, 01/2010   Normal nuclear stress test                                     Comment:06/2005 , also ABI normal 2007   Hypercholesterolemia                                         Fracture of metacarpal bone                     11/30/11     Past Surgical History:   LUMBAR LAMINECTOMY                                              Comment:'02   TUR-BT '06, '09                                               bladder cancer biopsy                            10/16/2010      Comment:negative for malignancy   bladder microscopic                              04/13/2007      Comment:high grade papillary urothelial lesions   CYSTOSTOMY W/ BLADDER BIOPSY                     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   PROSTATE BIOPSY                                  09/25/2011      Comment:GLEASON 3+3=6 AND 4+3=7   ASCENDING AORTIC ROOT REPLACEMENT               N/A 04/15/2012      Comment:Procedure: ASCENDING AORTIC ROOT REPLACEMENT;                Surgeon: Alleen Borne, MD;  Location: [facility] OR;               Service: Open Heart Surgery;  Laterality: N/A;   TEE WITHOUT CARDIOVERSION                       N/A 04/15/2012  Comment:Procedure: TRANSESOPHAGEAL ECHOCARDIOGRAM               (TEE);  Surgeon: Alleen Borne, MD;  Location:              [facility] OR;  Service: Open Heart Surgery;                Laterality: N/A;   EXPLORATION POST OPERATIVE OPEN HEART            04/2012      Review of patient's family history indicates:   Prostate cancer                Father                     Comment: passed with prostate ca   Social History   Marital Status: Single              Spouse Name:                      Years of Education: 13              Number of children: 0           Occupational History Occupation          Dealer                                 retired  Social History Main  Topics   Smoking Status: Former Smoker                   Packs/Day: 1.00  Years:           Types: Cigarettes     Quit date: 03/04/1975   Smokeless Status: Never Used                       Alcohol Use: Yes           2.5 oz/week      5 Drinks containing 0.5 oz of alcohol per week   Drug Use: No             Sexual Activity: Not Currently      Other Topics            Concern   None on file  Social History Narrative   Confirmed batchelor. Retired Engineer, site. World traveler- Geologist, engineering. I- ADLS. End of life Care   No CPR, no prolonged intubation, no prolonged artificial hydration or feeding, no heroic or futile measures.     Next trip [place]13 [place]      ROS:see HPI otherwise negative   PHYSICAL EXAM: Well-nournished, in no acute distress. Neck: bilateral murmur vs bruit portrayed,No JVD, HJR, or thyroid enlargement  Lungs:  No tachypnea, clear without wheezing, rales, or rhonchi   Cardiovascular: Irregular, PMI not displaced, 2/6 systolic murmur LSB, no gallops, bruit, thrill, or heave.  Abdomen: BS normal. Soft without organomegaly, masses, lesions or tenderness.  Extremities: tace edema ankles,without cyanosis, clubbing. Good distal pulses bilateral  SKin: Warm, no lesions or rashes   Musculoskeletal: No deformities  Neuro: no focal signs  BP 164/96  Pulse 62  Ht 5\' 8"  (1.727 m)  Wt 216 lb 3.2 oz (98.068 kg)  BMI 32.88 kg/m2   QMV:HQIONG fibrillation at 56 bpm Nonspecific ST T wave abnormality

## 2012-08-11 NOTE — Assessment & Plan Note (Signed)
Rate controlled on Coumadin 

## 2012-08-17 DIAGNOSIS — Z85828 Personal history of other malignant neoplasm of skin: Secondary | ICD-10-CM | POA: Diagnosis not present

## 2012-08-17 DIAGNOSIS — L570 Actinic keratosis: Secondary | ICD-10-CM | POA: Diagnosis not present

## 2012-08-17 DIAGNOSIS — Z85820 Personal history of malignant melanoma of skin: Secondary | ICD-10-CM | POA: Diagnosis not present

## 2012-08-17 DIAGNOSIS — D485 Neoplasm of uncertain behavior of skin: Secondary | ICD-10-CM | POA: Diagnosis not present

## 2012-08-17 DIAGNOSIS — L819 Disorder of pigmentation, unspecified: Secondary | ICD-10-CM | POA: Diagnosis not present

## 2012-08-17 DIAGNOSIS — D239 Other benign neoplasm of skin, unspecified: Secondary | ICD-10-CM | POA: Diagnosis not present

## 2012-08-18 DIAGNOSIS — C4442 Squamous cell carcinoma of skin of scalp and neck: Secondary | ICD-10-CM | POA: Diagnosis not present

## 2012-08-18 DIAGNOSIS — D044 Carcinoma in situ of skin of scalp and neck: Secondary | ICD-10-CM | POA: Diagnosis not present

## 2012-08-25 ENCOUNTER — Other Ambulatory Visit: Payer: Medicare Other

## 2012-08-25 ENCOUNTER — Other Ambulatory Visit (INDEPENDENT_AMBULATORY_CARE_PROVIDER_SITE_OTHER): Payer: Medicare Other

## 2012-08-25 ENCOUNTER — Ambulatory Visit (HOSPITAL_COMMUNITY): Payer: Medicare Other | Attending: Physician Assistant | Admitting: Radiology

## 2012-08-25 DIAGNOSIS — I2789 Other specified pulmonary heart diseases: Secondary | ICD-10-CM | POA: Insufficient documentation

## 2012-08-25 DIAGNOSIS — R011 Cardiac murmur, unspecified: Secondary | ICD-10-CM | POA: Diagnosis not present

## 2012-08-25 DIAGNOSIS — I079 Rheumatic tricuspid valve disease, unspecified: Secondary | ICD-10-CM | POA: Insufficient documentation

## 2012-08-25 DIAGNOSIS — N289 Disorder of kidney and ureter, unspecified: Secondary | ICD-10-CM

## 2012-08-25 DIAGNOSIS — E119 Type 2 diabetes mellitus without complications: Secondary | ICD-10-CM | POA: Insufficient documentation

## 2012-08-25 DIAGNOSIS — Z87891 Personal history of nicotine dependence: Secondary | ICD-10-CM | POA: Diagnosis not present

## 2012-08-25 DIAGNOSIS — I350 Nonrheumatic aortic (valve) stenosis: Secondary | ICD-10-CM

## 2012-08-25 DIAGNOSIS — I359 Nonrheumatic aortic valve disorder, unspecified: Secondary | ICD-10-CM | POA: Diagnosis not present

## 2012-08-25 DIAGNOSIS — R0989 Other specified symptoms and signs involving the circulatory and respiratory systems: Secondary | ICD-10-CM | POA: Insufficient documentation

## 2012-08-25 DIAGNOSIS — R0609 Other forms of dyspnea: Secondary | ICD-10-CM | POA: Diagnosis not present

## 2012-08-25 DIAGNOSIS — I10 Essential (primary) hypertension: Secondary | ICD-10-CM | POA: Insufficient documentation

## 2012-08-25 DIAGNOSIS — E669 Obesity, unspecified: Secondary | ICD-10-CM | POA: Diagnosis not present

## 2012-08-25 DIAGNOSIS — I080 Rheumatic disorders of both mitral and aortic valves: Secondary | ICD-10-CM | POA: Insufficient documentation

## 2012-08-25 LAB — BASIC METABOLIC PANEL
BUN: 19 mg/dL (ref 6–23)
CO2: 30 mEq/L (ref 19–32)
Chloride: 102 mEq/L (ref 96–112)
Creatinine, Ser: 0.8 mg/dL (ref 0.4–1.5)
Glucose, Bld: 134 mg/dL — ABNORMAL HIGH (ref 70–99)
Potassium: 4.4 mEq/L (ref 3.5–5.1)

## 2012-08-25 NOTE — Progress Notes (Signed)
Echocardiogram performed.  

## 2012-09-07 ENCOUNTER — Encounter: Payer: Self-pay | Admitting: Internal Medicine

## 2012-09-07 ENCOUNTER — Other Ambulatory Visit (INDEPENDENT_AMBULATORY_CARE_PROVIDER_SITE_OTHER): Payer: Medicare Other

## 2012-09-07 ENCOUNTER — Ambulatory Visit (INDEPENDENT_AMBULATORY_CARE_PROVIDER_SITE_OTHER): Payer: Medicare Other | Admitting: Internal Medicine

## 2012-09-07 VITALS — BP 130/70 | HR 63 | Temp 97.8°F | Resp 16 | Wt 214.0 lb

## 2012-09-07 DIAGNOSIS — I7101 Dissection of thoracic aorta: Secondary | ICD-10-CM

## 2012-09-07 DIAGNOSIS — E785 Hyperlipidemia, unspecified: Secondary | ICD-10-CM

## 2012-09-07 DIAGNOSIS — E119 Type 2 diabetes mellitus without complications: Secondary | ICD-10-CM

## 2012-09-07 DIAGNOSIS — Z0000 Encounter for general adult medical examination without abnormal findings: Secondary | ICD-10-CM

## 2012-09-07 DIAGNOSIS — M109 Gout, unspecified: Secondary | ICD-10-CM | POA: Diagnosis not present

## 2012-09-07 DIAGNOSIS — C61 Malignant neoplasm of prostate: Secondary | ICD-10-CM

## 2012-09-07 DIAGNOSIS — R9430 Abnormal result of cardiovascular function study, unspecified: Secondary | ICD-10-CM

## 2012-09-07 DIAGNOSIS — C679 Malignant neoplasm of bladder, unspecified: Secondary | ICD-10-CM

## 2012-09-07 DIAGNOSIS — I10 Essential (primary) hypertension: Secondary | ICD-10-CM

## 2012-09-07 DIAGNOSIS — I4891 Unspecified atrial fibrillation: Secondary | ICD-10-CM

## 2012-09-07 DIAGNOSIS — I359 Nonrheumatic aortic valve disorder, unspecified: Secondary | ICD-10-CM

## 2012-09-07 DIAGNOSIS — I350 Nonrheumatic aortic (valve) stenosis: Secondary | ICD-10-CM

## 2012-09-07 DIAGNOSIS — I482 Chronic atrial fibrillation, unspecified: Secondary | ICD-10-CM

## 2012-09-07 DIAGNOSIS — R0989 Other specified symptoms and signs involving the circulatory and respiratory systems: Secondary | ICD-10-CM

## 2012-09-07 LAB — LIPID PANEL
HDL: 44.9 mg/dL (ref >39.00)
LDL Cholesterol: 45 mg/dL (ref 0–99)
VLDL: 34 mg/dL (ref 0.0–40.0)

## 2012-09-07 LAB — HEMOGLOBIN A1C: Hgb A1c MFr Bld: 7.1 % — ABNORMAL HIGH (ref 4.6–6.5)

## 2012-09-07 LAB — URIC ACID: Uric Acid, Serum: 3.7 mg/dL — ABNORMAL LOW (ref 4.0–7.8)

## 2012-09-07 MED ORDER — ZOLPIDEM TARTRATE 5 MG PO TABS: 5.0000 mg | ORAL_TABLET | Freq: Every evening | ORAL | Status: DC | PRN

## 2012-09-07 MED ORDER — SITAGLIPTIN PHOSPHATE 100 MG PO TABS: 100.0000 mg | ORAL_TABLET | Freq: Every day | ORAL | Status: DC

## 2012-09-07 NOTE — Assessment & Plan Note (Signed)
Seen regularly by Dr. Margarita Grizzle. He reports that he will see Dr. Margarita Grizzle in December at which time he may have follow up cystoscopy.

## 2012-09-07 NOTE — Assessment & Plan Note (Signed)
Seen regularly by Dr. Woodruff. He reports that he will see Dr. Woodruff in December at which time he may have follow up cystoscopy. 

## 2012-09-07 NOTE — Assessment & Plan Note (Signed)
Lab Results  Component Value Date   CHOL 124 09/07/2012   HDL 44.90 09/07/2012   LDLCALC 45 09/07/2012   TRIG 170.0* 09/07/2012   CHOLHDL 3 09/07/2012   Excellent control.  Plan  Continue present medication

## 2012-09-07 NOTE — Assessment & Plan Note (Addendum)
Last A1C 6.1 %.   Plan Follow up A1C with recommendations to follow.  Addendum:  Lab Results  Component Value Date   HGBA1C 7.1* 09/07/2012   Great control - no change in medication

## 2012-09-07 NOTE — Assessment & Plan Note (Signed)
BP Readings from Last 3 Encounters:  09/07/12 130/70  08/11/12 164/96  07/29/12 180/105   Very good control today on present regimen. No changes proposed.

## 2012-09-07 NOTE — Assessment & Plan Note (Signed)
He completed XRT with several week interruption due to heart surgery. He reports no after effects from radiation. Dr Margarita Grizzle has recommended continued hormonal treatment - lupron.

## 2012-09-07 NOTE — Progress Notes (Signed)
Subjective:    Patient ID: Eric Rivers, male    DOB: [birth year], 79 y.o.   MRN: [id number]  HPI Eric Rivers is just at 5 months out from repair of an aortic dissection with open surgery and he is doing great - activity level is close to pre-op. Will be seeing Dr. Laneta Simmers in Aug.  Prostate - finished XRT and had very low PSA. Is to see Dr. Margarita Grizzle in early August for follow-up. He has not had any adverse affects after treatment. He will have repeat cystoscopy in December. He still has a weak stream to dribble.   Has seen cardiology. Had 2 D echo : EF 45-50% with diffuse hypokinesis. Grade 4 diastolic dysfunction.  AoV mild stenosis. Last study in 2011 EF 55-60% and no diastolic dysfunction. He is on a Beta-blocker, furosemide. No RAS drug.  Diabetes- Dec '13 A1C 6.1% - due for follow-up   Lipid - last lipid panel June '13 - excellent with LDL 48, HDL 40.7. Due for follow up lab.  Gout - no flares. Lasat Uric Acid 3.8 June '12. Due for follow up.   GERD - no pain but he has had a recurrent cough.  He is current with his dentist, eye doctor. He does follow a healthy diet. Could work on his exercise a little more.  May resume travel.  Past Medical History  Diagnosis Date  . Whooping cough   . Bladder cancer     recurrence with TUR-B March '09  . Atrial fibrillation      Chronic,   24 hour holter, September, 2011.... atrial fib rate is controlled.... there is some bradycardia but  no marked pauses  . Diabetes mellitus     type 2  . Hypertension   . DDD (degenerative disc disease)     lumbar spine  . GERD (gastroesophageal reflux disease)   . Gout   . Fluid overload   . ACE-inhibitor cough   . Shortness of breath     on exertion  . Aortic stenosis     mild....echo... september... 2010/ mild... echo.[name] KitchenMarland KitchenDec, 2011  . Pulmonary hypertension     , echo, 01/2010  . Hyperlipidemia   . Hx of colonoscopy     approx. 10 years  . Warfarin anticoagulation     Atrial fib  .  Ejection fraction     EF 60%, echo, 01/2010  . Normal nuclear stress test     06/2005 , also ABI normal 2007  . Hypercholesterolemia   . Fracture of metacarpal bone 11/30/11   Past Surgical History  Procedure Laterality Date  . Lumbar laminectomy      '02  . Tur-bt '06, '09    . Bladder cancer biopsy  10/16/2010    negative for malignancy  . Bladder microscopic  04/13/2007    high grade papillary urothelial lesions  . 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 Prostate biopsy  09/25/2011    GLEASON 3+3=6 AND 4+3=7  . Ascending aortic root replacement N/A 04/15/2012    Procedure: ASCENDING AORTIC ROOT REPLACEMENT;  Surgeon: Alleen Borne, MD;  Location: [facility] OR;  Service: Open Heart Surgery;  Laterality: N/A;  . Tee without cardioversion N/A 04/15/2012    Procedure: TRANSESOPHAGEAL ECHOCARDIOGRAM (TEE);  Surgeon: Alleen Borne, MD;  Location: [facility] OR;  Service: Open Heart Surgery;  Laterality: N/A;  . Exploration post operative open heart  04/2012   Family History  Problem Relation Age  of Onset  . Prostate cancer Father 12    passed with prostate ca   History   Social History  . Marital Status: Single    Spouse Name: N/A    Number of Children: 0  . Years of Education: 32   Occupational History  . teacher     retired   Social History Main Topics  . Smoking status: Former Smoker -- 1.00 packs/day    Types: Cigarettes    Quit date: 03/04/1975  . Smokeless tobacco: Never Used  . Alcohol Use: 2.5 oz/week    5 drink(s) per week  . Drug Use: No  . Sexually Active: Not Currently   Other Topics Concern  . Not on file   Social History Narrative   Confirmed batchelor. Retired Engineer, site. World traveler- Geologist, engineering. I- ADLS. End of life Care   No CPR, no prolonged intubation, no prolonged artificial hydration or feeding, no heroic or futile measures.       Next trip [place]13 [place]           Current Outpatient Prescriptions on File Prior to Visit  Medication Sig Dispense Refill  . acetaminophen (TYLENOL) 325 MG tablet Take 650 mg by mouth every 6 (six) hours as needed for pain.      [name] Kitchen allopurinol (ZYLOPRIM) 300 MG tablet take 1 tablet by mouth once daily  90 tablet  3  . aspirin EC 325 MG EC tablet Take 1 tablet (325 mg total) by mouth daily.  30 tablet    . atorvastatin (LIPITOR) 20 MG tablet take 1 tablet by mouth once daily  90 tablet  3  . Calcium Carbonate-Vitamin D (CALCIUM + D PO) Take 1 tablet by mouth daily.      . finasteride (PROSCAR) 5 MG tablet Take 5 mg by mouth daily.        . furosemide (LASIX) 40 MG tablet Take 0.5 tablets (20 mg total) by mouth daily.  30 tablet  6  . leuprolide (LUPRON) 30 MG injection Inject 30 mg into the muscle every 4 (four) months. Every 4 months      . metoprolol tartrate (LOPRESSOR) 25 MG tablet take 1 tablet by mouth twice a day  180 tablet  0  . Multiple Vitamins-Minerals (ONCOVITE PO) Take by mouth.        . pantoprazole (PROTONIX) 40 MG tablet take 1 tablet by mouth once daily  90 tablet  3  . potassium chloride SA (K-DUR,KLOR-CON) 20 MEQ tablet Take 0.5 tablets (10 mEq total) by mouth daily.  30 tablet  6  . senna (SENOKOT) 8.6 MG tablet Take 1 tablet by mouth daily.        . sitaGLIPtin (JANUVIA) 100 MG tablet Take 1 tablet (100 mg total) by mouth daily.  30 tablet  0  . Tamsulosin HCl (FLOMAX) 0.4 MG CAPS Take 0.4 mg by mouth at bedtime. Restarted flomax  03/12/12 after talking with MD      . warfarin (COUMADIN) 4 MG tablet Take 1 tablet (4 mg total) by mouth daily. Take as directed by coumadin clinic.  120 tablet  1  . zolpidem (AMBIEN) 5 MG tablet Take 1 tablet (5 mg total) by mouth at bedtime as needed for sleep.  90 tablet  1   No current facility-administered medications on file prior to visit.      Review of Systems Constitutional:  Negative for fever, chills, activity change and unexpected  weight change.  HEENT:   Negative for hearing loss, ear pain, congestion, neck stiffness and postnasal drip. Negative for sore throat or swallowing problems. Negative for dental complaints.   Eyes: Negative for vision loss or change in visual acuity.  Respiratory: Negative for chest tightness and wheezing. Negative for DOE.   Cardiovascular: Negative for chest pain or palpitations. decreased exercise tolerance Gastrointestinal: No change in bowel habit. No bloating or gas. No reflux or indigestion Genitourinary: Negative for urgency, frequency, flank pain and difficulty urinating.  Musculoskeletal: Negative for myalgias, back pain, arthralgias and gait problem.  Neurological: Negative for dizziness, tremors, weakness and headaches.  Hematological: Negative for adenopathy.  Psychiatric/Behavioral: Negative for behavioral problems and dysphoric mood.       Objective:   Physical Exam Filed Vitals:   09/07/12 1439  BP: 130/70  Pulse: 63  Temp: 97.8 F (36.6 C)  Resp: 16   Wt Readings from Last 3 Encounters:  09/07/12 214 lb (97.07 kg)  08/11/12 216 lb 3.2 oz (98.068 kg)  07/29/12 222 lb 1.6 oz (100.744 kg)   BP Readings from Last 3 Encounters:  09/07/12 130/70  08/11/12 164/96  07/29/12 180/105   gen'l- WNWD less overweight white man in no distress HEENT - EAC, TMs normal, oropharynx w/o lesions and teeth in good repair. C&S clear, PERRLA Neck -supple, no thyromegaly Cor 2+ radial pulse, IRIR rate controlled, no JVD, no carotid bruits Pulm - normal respirations, Chest - gynecomastia, well healed sternotomy scar Abd - obese, soft, BS + Ext - w'/o deformity Neuro - A&O x 3, non-focal exam. (see foot exam)   Recent Results (from the past 2160 hour(s))  POCT INR     Status: None   Collection Time    06/15/12  2:27 PM      Result Value Range   INR 2.2    POCT INR     Status: None   Collection Time    07/06/12  2:25 PM      Result Value Range   INR 2.4    POCT INR     Status: None   Collection  Time    08/03/12  2:30 PM      Result Value Range   INR 2.3    BASIC METABOLIC PANEL     Status: Abnormal   Collection Time    08/25/12 11:08 AM      Result Value Range   Sodium 141  135 - 145 mEq/L   Potassium 4.4  3.5 - 5.1 mEq/L   Chloride 102  96 - 112 mEq/L   CO2 30  19 - 32 mEq/L   Glucose, Bld 134 (*) 70 - 99 mg/dL   BUN 19  6 - 23 mg/dL   Creatinine, Ser 0.8  0.4 - 1.5 mg/dL   Calcium 9.9  8.4 - 45.4 mg/dL   GFR 098.11  >91.47 mL/min  HEMOGLOBIN A1C     Status: Abnormal   Collection Time    09/07/12  4:52 PM      Result Value Range   Hemoglobin A1C 7.1 (*) 4.6 - 6.5 %   Comment: Glycemic Control Guidelines for People with Diabetes:Non Diabetic:  <6%Goal of Therapy: <7%Additional Action Suggested:  >8%   LIPID PANEL     Status: Abnormal   Collection Time    09/07/12  4:52 PM      Result Value Range   Cholesterol 124  0 - 200 mg/dL   Comment: ATP III Classification  Desirable:  < 200 mg/dL               Borderline High:  200 - 239 mg/dL          High:  > = 161 mg/dL   Triglycerides 096.0 (*) 0.0 - 149.0 mg/dL   Comment: Normal:  <454 mg/dLBorderline High:  150 - 199 mg/dL   HDL 09.81  >19.14 mg/dL   VLDL 78.2  0.0 - 95.6 mg/dL   LDL Cholesterol 45  0 - 99 mg/dL   Total CHOL/HDL Ratio 3     Comment:                Men          Women1/2 Average Risk     3.4          3.3Average Risk          5.0          4.42X Average Risk          9.6          7.13X Average Risk          15.0          11.0                      URIC ACID     Status: Abnormal   Collection Time    09/07/12  4:52 PM      Result Value Range   Uric Acid, Serum 3.7 (*) 4.0 - 7.8 mg/dL        Assessment & Plan:

## 2012-09-07 NOTE — Progress Notes (Signed)
Zolpidem #90 called to [facility]

## 2012-09-07 NOTE — Assessment & Plan Note (Signed)
Approaching 5 months post - op. He is doing very well with resumption of most of his activities. No real discomfort, just occasional itch/tingle at sternotomy site.  Plan Follow up with Dr. Laneta Simmers as scheduled.

## 2012-09-07 NOTE — Patient Instructions (Addendum)
Eric Rivers is just at 5 months out from repair of an aortic dissection with open surgery and he is doing great - activity level is close to pre-op. Will be seeing Dr. Laneta Simmers in Aug.  Prostate - finished XRT and had very low PSA. Is to see Dr. Margarita Grizzle in early August for follow-up. He has not had any adverse affects after treatment. He will have repeat cystoscopy in December. He still has a weak stream to dribble.   Has seen cardiology. Had 2 D echo : EF 45-50% with diffuse hypokinesis. Grade 4 diastolic dysfunction.  AoV mild stenosis. Last study in 2011 EF 55-60% and no diastolic dysfunction. He is on a Beta-blocker, furosemide. No RAS drug.  Diabetes- Dec '13 A1C 6.1% - due for follow-up   Lipid - last lipid panel June '13 - excellent with LDL 48, HDL 40.7. Due for follow up lab.  Gout - no flares. Lasat Uric Acid 3.8 June '12. Due for follow up.   GERD - no pain but he has had a recurrent cough.  May resume travel.

## 2012-09-07 NOTE — Assessment & Plan Note (Signed)
Plan Routine follow up uric acid level. Recommendations to follow.

## 2012-09-07 NOTE — Assessment & Plan Note (Signed)
Progressive cardiomyopathy based on change of EF, which may be overstated in setting of grade 4 diastolic dysfunction. He is able to carry out all his normal activities with no increased SOB.  Plan Continue present medications  Follow up with Dr. Myrtis Ser as instructed - will defer question of RAS class drug, e.g. ARB.  Watch for fluid overload/weight gain.

## 2012-09-07 NOTE — Assessment & Plan Note (Signed)
No change in Aortic stenosis based on last 2 D echo.

## 2012-09-07 NOTE — Assessment & Plan Note (Signed)
Interval history is quite remarkable: aortic dissection with aorta replacement; prostate cancer s/p hormonal and radiation treatment. Limited physical exam today is good. Labs reviewed: great lipid panel, A1C is good, Uric acid is low. He is current with colorectal cancer screening and at age 79 with multiple comorbidities further screening is not recommended. Immunizations are up to date.  In summary - a delightful Eric Rivers who has made a really good recovery from his vascular surgery. He is doing some walking. He will follow up with his team as scheduled. He can look forward to resuming his inverterate cruising hobby.

## 2012-09-07 NOTE — Assessment & Plan Note (Signed)
On exam he remains rate controlled. He is on warfarin long term and is doing well.   Plan Continue present medications  See Dr. Myrtis Ser as scheduled (probably Dec '14)

## 2012-09-09 ENCOUNTER — Telehealth: Payer: Self-pay | Admitting: Nurse Practitioner

## 2012-09-09 DIAGNOSIS — C4442 Squamous cell carcinoma of skin of scalp and neck: Secondary | ICD-10-CM | POA: Diagnosis not present

## 2012-09-09 NOTE — Telephone Encounter (Signed)
Scheduled patient to see Dr. Myrtis Ser on 7/30 per Jacolyn Reedy, PA-C recommendation that patient see Dr. Myrtis Ser within the month for decreased EF of 45-50% down from 55-60% on 12/11.

## 2012-09-14 ENCOUNTER — Ambulatory Visit (INDEPENDENT_AMBULATORY_CARE_PROVIDER_SITE_OTHER): Payer: Medicare Other | Admitting: General Practice

## 2012-09-14 ENCOUNTER — Telehealth: Payer: Self-pay

## 2012-09-14 DIAGNOSIS — I272 Pulmonary hypertension, unspecified: Secondary | ICD-10-CM

## 2012-09-14 DIAGNOSIS — I2789 Other specified pulmonary heart diseases: Secondary | ICD-10-CM

## 2012-09-14 DIAGNOSIS — Z7901 Long term (current) use of anticoagulants: Secondary | ICD-10-CM | POA: Diagnosis not present

## 2012-09-14 DIAGNOSIS — I482 Chronic atrial fibrillation, unspecified: Secondary | ICD-10-CM

## 2012-09-14 DIAGNOSIS — I4891 Unspecified atrial fibrillation: Secondary | ICD-10-CM | POA: Diagnosis not present

## 2012-09-14 LAB — POCT INR: INR: 2.2

## 2012-09-14 NOTE — Telephone Encounter (Signed)
Patient states he got his Januvia 100 mg filled for #30 plus 5 refills. He states when he runs out he would like it refilled for #90 of the generic. He says he will also call when it is time.

## 2012-09-16 DIAGNOSIS — D044 Carcinoma in situ of skin of scalp and neck: Secondary | ICD-10-CM | POA: Diagnosis not present

## 2012-09-23 DIAGNOSIS — L84 Corns and callosities: Secondary | ICD-10-CM | POA: Diagnosis not present

## 2012-09-23 DIAGNOSIS — E1159 Type 2 diabetes mellitus with other circulatory complications: Secondary | ICD-10-CM | POA: Diagnosis not present

## 2012-09-23 DIAGNOSIS — I739 Peripheral vascular disease, unspecified: Secondary | ICD-10-CM | POA: Diagnosis not present

## 2012-09-23 DIAGNOSIS — C61 Malignant neoplasm of prostate: Secondary | ICD-10-CM | POA: Diagnosis not present

## 2012-09-23 DIAGNOSIS — L608 Other nail disorders: Secondary | ICD-10-CM | POA: Diagnosis not present

## 2012-09-28 ENCOUNTER — Encounter: Payer: Self-pay | Admitting: Cardiology

## 2012-09-28 DIAGNOSIS — I340 Nonrheumatic mitral (valve) insufficiency: Secondary | ICD-10-CM | POA: Insufficient documentation

## 2012-09-29 ENCOUNTER — Encounter: Payer: Self-pay | Admitting: Cardiology

## 2012-09-29 ENCOUNTER — Ambulatory Visit (INDEPENDENT_AMBULATORY_CARE_PROVIDER_SITE_OTHER): Payer: Medicare Other | Admitting: Cardiology

## 2012-09-29 VITALS — BP 130/82 | HR 65 | Ht 68.0 in | Wt 220.1 lb

## 2012-09-29 DIAGNOSIS — I359 Nonrheumatic aortic valve disorder, unspecified: Secondary | ICD-10-CM

## 2012-09-29 DIAGNOSIS — R0989 Other specified symptoms and signs involving the circulatory and respiratory systems: Secondary | ICD-10-CM

## 2012-09-29 DIAGNOSIS — I4891 Unspecified atrial fibrillation: Secondary | ICD-10-CM

## 2012-09-29 DIAGNOSIS — I350 Nonrheumatic aortic (valve) stenosis: Secondary | ICD-10-CM

## 2012-09-29 DIAGNOSIS — Z7901 Long term (current) use of anticoagulants: Secondary | ICD-10-CM | POA: Diagnosis not present

## 2012-09-29 DIAGNOSIS — I509 Heart failure, unspecified: Secondary | ICD-10-CM

## 2012-09-29 DIAGNOSIS — R9430 Abnormal result of cardiovascular function study, unspecified: Secondary | ICD-10-CM

## 2012-09-29 DIAGNOSIS — I7101 Dissection of thoracic aorta: Secondary | ICD-10-CM

## 2012-09-29 DIAGNOSIS — I5042 Chronic combined systolic (congestive) and diastolic (congestive) heart failure: Secondary | ICD-10-CM

## 2012-09-29 DIAGNOSIS — I482 Chronic atrial fibrillation, unspecified: Secondary | ICD-10-CM

## 2012-09-29 NOTE — Progress Notes (Signed)
HPI  Patient is seen today to followup atrial fibrillation and diastolic CHF and history of type A aortic dissection repaired in February, 2014. I had seen him last in the office in December, 2013. In February, 2014 he awoke at night with a headache and then some chest pressure. When he came to the hospital it was clear that he had an aortic dissection. He was operated on successfully by Dr. Laneta Simmers he received a graft that was placed above the coronary arteries. His native aortic valve was left intact. He recovered well.  He was seen in the office in June with some fluid overload. His diuretics had been on hold. These were resumed. Also two-dimensional echo was ordered to reassess his overall status. Ejection fraction was 45-50%. There was only very slight aortic stenosis.  Since that time he's felt well. He is not having any significant difficulties.  As part of today's evaluation I have reviewed my old office notes. I have reviewed the hospital records in the operative note from his aortic dissection. I have reviewed the last office note. I have reviewed his recent echo.    No Known Allergies  Current Outpatient Prescriptions  Medication Sig Dispense Refill  . acetaminophen (TYLENOL) 325 MG tablet Take 650 mg by mouth every 6 (six) hours as needed for pain.      [name] Kitchen allopurinol (ZYLOPRIM) 300 MG tablet take 1 tablet by mouth once daily  90 tablet  3  . aspirin EC 325 MG EC tablet Take 1 tablet (325 mg total) by mouth daily.  30 tablet    . atorvastatin (LIPITOR) 20 MG tablet take 1 tablet by mouth once daily  90 tablet  3  . Calcium Carbonate-Vitamin D (CALCIUM + D PO) Take 1 tablet by mouth daily.      . finasteride (PROSCAR) 5 MG tablet Take 5 mg by mouth daily.        . furosemide (LASIX) 40 MG tablet Take 0.5 tablets (20 mg total) by mouth daily.  30 tablet  6  . leuprolide (LUPRON) 30 MG injection Inject 30 mg into the muscle every 4 (four) months. Every 4 months      . metoprolol  tartrate (LOPRESSOR) 25 MG tablet take 1 tablet by mouth twice a day  180 tablet  0  . Multiple Vitamins-Minerals (ONCOVITE PO) Take by mouth.        . pantoprazole (PROTONIX) 40 MG tablet take 1 tablet by mouth once daily  90 tablet  3  . potassium chloride SA (K-DUR,KLOR-CON) 20 MEQ tablet Take 0.5 tablets (10 mEq total) by mouth daily.  30 tablet  6  . senna (SENOKOT) 8.6 MG tablet Take 1 tablet by mouth daily.        . sitaGLIPtin (JANUVIA) 100 MG tablet Take 1 tablet (100 mg total) by mouth daily.  30 tablet  5  . Tamsulosin HCl (FLOMAX) 0.4 MG CAPS Take 0.4 mg by mouth at bedtime. Restarted flomax  03/12/12 after talking with MD      . warfarin (COUMADIN) 4 MG tablet Take 1 tablet (4 mg total) by mouth daily. Take as directed by coumadin clinic.  120 tablet  1  . zolpidem (AMBIEN) 5 MG tablet Take 1 tablet (5 mg total) by mouth at bedtime as needed for sleep.  90 tablet  0   No current facility-administered medications for this visit.    History   Social History  . Marital Status: Single    Spouse Name:  N/A    Number of Children: 0  . Years of Education: 66   Occupational History  . teacher     retired   Social History Main Topics  . Smoking status: Former Smoker -- 1.00 packs/day    Types: Cigarettes    Quit date: 03/04/1975  . Smokeless tobacco: Never Used  . Alcohol Use: 2.5 oz/week    5 drink(s) per week  . Drug Use: No  . Sexually Active: Not Currently   Other Topics Concern  . Not on file   Social History Narrative   Confirmed batchelor. Retired Engineer, site. World traveler- Geologist, engineering. I- ADLS. End of life Care   No CPR, no prolonged intubation, no prolonged artificial hydration or feeding, no heroic or futile measures.       [place] trip Sept [place]13 [place]          Family History  Problem Relation Age of Onset  . Prostate cancer Father 63    passed with prostate ca    Past Medical History  Diagnosis Date  .  Whooping cough   . Bladder cancer     recurrence with TUR-B March '09  . Atrial fibrillation      Chronic,   24 hour holter, September, 2011.... atrial fib rate is controlled.... there is some bradycardia but  no marked pauses  . Diabetes mellitus     type 2  . Hypertension   . DDD (degenerative disc disease)     lumbar spine  . GERD (gastroesophageal reflux disease)   . Gout   . Fluid overload   . ACE-inhibitor cough   . Shortness of breath     on exertion  . Aortic stenosis     mild....echo... september... 2010/ mild... echo.[name] KitchenMarland KitchenDec, 2011  . Pulmonary hypertension     , echo, 01/2010  . Hyperlipidemia   . Hx of colonoscopy     approx. 10 years  . Warfarin anticoagulation     Atrial fib  . Ejection fraction     EF 60%, echo, 01/2010  . Normal nuclear stress test     06/2005 , also ABI normal 2007  . Hypercholesterolemia   . Fracture of metacarpal bone 11/30/11  . Aortic dissection     Surgical repair February, 2014  . Mitral regurgitation     Past Surgical History  Procedure Laterality Date  . Lumbar laminectomy      '02  . Tur-bt '06, '09    . Bladder cancer biopsy  10/16/2010    negative for malignancy  . Bladder microscopic  04/13/2007    high grade papillary urothelial lesions  . 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 Prostate biopsy  09/25/2011    GLEASON 3+3=6 AND 4+3=7  . Ascending aortic root replacement N/A 04/15/2012    Procedure: ASCENDING AORTIC ROOT REPLACEMENT;  Surgeon: Alleen Borne, MD;  Location: [facility] OR;  Service: Open Heart Surgery;  Laterality: N/A;  . Tee without cardioversion N/A 04/15/2012    Procedure: TRANSESOPHAGEAL ECHOCARDIOGRAM (TEE);  Surgeon: Alleen Borne, MD;  Location: [facility];  Service: Open Heart Surgery;  Laterality: N/A;  . Exploration post operative open heart  04/2012    Patient Active Problem List   Diagnosis Date Noted  . Warfarin anticoagulation     Priority: High  . Mitral  regurgitation   . Ascending aortic dissection 04/16/2012  . Malignant neoplasm  of prostate 10/27/2011  . Hypertension   . GERD (gastroesophageal reflux disease)   . Aortic stenosis   . Pulmonary hypertension   . Hyperlipidemia   . Ejection fraction   . Normal nuclear stress test   . Routine general medical examination at a health care facility 08/22/2010  . Bladder cancer   . Gout   . Chronic atrial fibrillation   . HYPERTROPHY PROSTATE W/O UR OBST & OTH LUTS 09/07/2007  . DEGENERATIVE JOINT DISEASE, GENERALIZED 09/07/2007  . NEOP, MALIGNANT, BLADDER NEC 12/04/2006  . DIABETES MELLITUS, TYPE II 12/04/2006    ROS   Patient denies fever, chills, headache, sweats, rash, change in vision, change in hearing, chest pain, cough, nausea or vomiting, urinary symptoms. All other systems are reviewed and are negative.  PHYSICAL EXAM  Patient is oriented to person time and place. Affect is normal. There is no jugulovenous distention. Lungs are clear. Respiratory effort is nonlabored. He has a skin lesion on his head after a biopsy. Cardiac exam reveals S1 and S2. There is a 2/6 systolic murmur. The abdomen is protuberant but soft. He has no significant peripheral edema. There no musculoskeletal deformities. There are no skin rashes.   Filed Vitals:   09/29/12 1017  BP: 130/82  Pulse: 65  Height: 5\' 8"  (1.727 m)  Weight: 220 lb 1.9 oz (99.846 kg)  SpO2: 98%     ASSESSMENT & PLAN

## 2012-09-29 NOTE — Assessment & Plan Note (Signed)
Patient has a tendency to retain fluid without his diuretics. These have been restarted. Renal function was documented as stable. No change in therapy at this time. I will consider an ACE inhibitor over time but not today.  As part of today's evaluation I spent greater than 25 minutes with his total care. More than half of this time was spent direct contact with him talking about his multiple issues.

## 2012-09-29 NOTE — Assessment & Plan Note (Signed)
Atrial fibrillation is controlled. No change in therapy. 

## 2012-09-29 NOTE — Assessment & Plan Note (Signed)
His most recent echo suggests that there has been some decrease in his ejection fraction. However remains 45-50%. He tolerated his severe illness with aortic dissection without major cardiac problems. I had considered but chosen not to do any further cardiac testing at this time. Also I have chosen not to change his medicines.

## 2012-09-29 NOTE — Patient Instructions (Signed)
Your physician recommends that you continue on your current medications as directed. Please refer to the Current Medication list given to you today.  Your physician wants you to follow-up in: 3 months. You will receive a reminder letter in the mail two months in advance. If you don't receive a letter, please call our office to schedule the follow-up appointment.  

## 2012-09-29 NOTE — Assessment & Plan Note (Signed)
He continues on Coumadin for his atrial fib.

## 2012-09-29 NOTE — Assessment & Plan Note (Signed)
He has only very mild stenosis of his native aortic valve.

## 2012-09-29 NOTE — Assessment & Plan Note (Signed)
His ascending aortic dissection was treated with a 30 mm Hemashield graft. This was placed supra- coronary.

## 2012-10-05 ENCOUNTER — Other Ambulatory Visit: Payer: Self-pay

## 2012-10-05 MED ORDER — SITAGLIPTIN PHOSPHATE 100 MG PO TABS: 100.0000 mg | ORAL_TABLET | Freq: Every day | ORAL | Status: DC

## 2012-10-07 DIAGNOSIS — R351 Nocturia: Secondary | ICD-10-CM | POA: Diagnosis not present

## 2012-10-07 DIAGNOSIS — C61 Malignant neoplasm of prostate: Secondary | ICD-10-CM | POA: Diagnosis not present

## 2012-10-07 DIAGNOSIS — N400 Enlarged prostate without lower urinary tract symptoms: Secondary | ICD-10-CM | POA: Diagnosis not present

## 2012-10-14 ENCOUNTER — Encounter: Payer: Self-pay | Admitting: *Deleted

## 2012-10-18 ENCOUNTER — Other Ambulatory Visit: Payer: Medicare Other

## 2012-10-20 ENCOUNTER — Other Ambulatory Visit: Payer: Self-pay | Admitting: Internal Medicine

## 2012-10-26 ENCOUNTER — Ambulatory Visit (INDEPENDENT_AMBULATORY_CARE_PROVIDER_SITE_OTHER): Payer: Medicare Other | Admitting: General Practice

## 2012-10-26 DIAGNOSIS — Z7901 Long term (current) use of anticoagulants: Secondary | ICD-10-CM | POA: Diagnosis not present

## 2012-10-26 DIAGNOSIS — I2789 Other specified pulmonary heart diseases: Secondary | ICD-10-CM | POA: Diagnosis not present

## 2012-10-26 DIAGNOSIS — I4891 Unspecified atrial fibrillation: Secondary | ICD-10-CM

## 2012-10-26 DIAGNOSIS — I272 Pulmonary hypertension, unspecified: Secondary | ICD-10-CM

## 2012-10-26 DIAGNOSIS — I482 Chronic atrial fibrillation, unspecified: Secondary | ICD-10-CM

## 2012-10-28 DIAGNOSIS — H2510 Age-related nuclear cataract, unspecified eye: Secondary | ICD-10-CM | POA: Diagnosis not present

## 2012-10-28 DIAGNOSIS — E119 Type 2 diabetes mellitus without complications: Secondary | ICD-10-CM | POA: Diagnosis not present

## 2012-10-29 ENCOUNTER — Encounter: Payer: Self-pay | Admitting: Internal Medicine

## 2012-11-02 ENCOUNTER — Other Ambulatory Visit: Payer: Self-pay

## 2012-11-02 DIAGNOSIS — I71019 Dissection of thoracic aorta, unspecified: Secondary | ICD-10-CM

## 2012-11-02 DIAGNOSIS — I7101 Dissection of thoracic aorta: Secondary | ICD-10-CM

## 2012-11-04 ENCOUNTER — Ambulatory Visit: Payer: Medicare Other | Admitting: Cardiology

## 2012-11-04 ENCOUNTER — Other Ambulatory Visit: Payer: Self-pay | Admitting: Internal Medicine

## 2012-11-19 ENCOUNTER — Other Ambulatory Visit: Payer: Self-pay | Admitting: Surgery

## 2012-11-19 DIAGNOSIS — I712 Thoracic aortic aneurysm, without rupture: Secondary | ICD-10-CM | POA: Diagnosis not present

## 2012-11-19 LAB — BUN: BUN: 16 mg/dL (ref 6–23)

## 2012-11-24 ENCOUNTER — Ambulatory Visit (INDEPENDENT_AMBULATORY_CARE_PROVIDER_SITE_OTHER): Payer: Medicare Other | Admitting: Surgery

## 2012-11-24 ENCOUNTER — Ambulatory Visit
Admission: RE | Admit: 2012-11-24 | Discharge: 2012-11-24 | Disposition: A | Discharge status: Home / Self Care | Payer: Medicare Other | Source: Ambulatory Visit | Attending: Surgery | Admitting: Surgery

## 2012-11-24 ENCOUNTER — Encounter: Payer: Self-pay | Admitting: Surgery

## 2012-11-24 VITALS — BP 120/74 | HR 67 | Resp 16 | Ht 68.0 in | Wt 220.0 lb

## 2012-11-24 DIAGNOSIS — Z09 Encounter for follow-up examination after completed treatment for conditions other than malignant neoplasm: Secondary | ICD-10-CM

## 2012-11-24 DIAGNOSIS — I71019 Dissection of thoracic aorta, unspecified: Secondary | ICD-10-CM

## 2012-11-24 DIAGNOSIS — I714 Abdominal aortic aneurysm, without rupture: Secondary | ICD-10-CM | POA: Diagnosis not present

## 2012-11-24 DIAGNOSIS — I7101 Dissection of thoracic aorta: Secondary | ICD-10-CM

## 2012-11-24 MED ORDER — IOHEXOL 350 MG/ML SOLN
80.0000 mL | Freq: Once | INTRAVENOUS | Status: AC | PRN
  Administered 2012-11-24: 80 mL via INTRAVENOUS

## 2012-11-24 NOTE — Progress Notes (Signed)
[address]       [place] [address]             [phone]        HPI:  Patient returns for routine postoperative follow-up having undergone repair of an acute type A aortic dissection using a supra-coronary tube graft on 04/15/2012. He has been feeling well and is walking without chest pain or shortness of breath. His energy level is good.  Current Outpatient Prescriptions  Medication Sig Dispense Refill  . acetaminophen (TYLENOL) 325 MG tablet Take 650 mg by mouth every 6 (six) hours as needed for pain.      [name] Kitchen allopurinol (ZYLOPRIM) 300 MG tablet take 1 tablet by mouth once daily  90 tablet  0  . aspirin EC 325 MG EC tablet Take 1 tablet (325 mg total) by mouth daily.  30 tablet    . atorvastatin (LIPITOR) 20 MG tablet take 1 tablet by mouth once daily  90 tablet  0  . Calcium Carbonate-Vitamin D (CALCIUM + D PO) Take 1 tablet by mouth daily.      . finasteride (PROSCAR) 5 MG tablet Take 5 mg by mouth daily.        . furosemide (LASIX) 40 MG tablet Take 0.5 tablets (20 mg total) by mouth daily.  30 tablet  6  . leuprolide (LUPRON) 30 MG injection Inject 30 mg into the muscle every 4 (four) months. Every 4 months      . metoprolol tartrate (LOPRESSOR) 25 MG tablet TAKE 1 TABLET BY MOUTH TWICE A DAY  180 tablet  0  . Multiple Vitamins-Minerals (ONCOVITE PO) Take by mouth.        . pantoprazole (PROTONIX) 40 MG tablet take 1 tablet by mouth once daily  90 tablet  0  . potassium chloride SA (K-DUR,KLOR-CON) 20 MEQ tablet Take 0.5 tablets (10 mEq total) by mouth daily.  30 tablet  6  . senna (SENOKOT) 8.6 MG tablet Take 1 tablet by mouth daily.        . sitaGLIPtin (JANUVIA) 100 MG tablet Take 1 tablet (100 mg total) by mouth daily. PATIENT WANTS GENERIC OF THIS MEDICATION  90 tablet  3  . Tamsulosin HCl (FLOMAX) 0.4 MG CAPS Take 0.4 mg by mouth at bedtime. Restarted flomax  03/12/12 after talking with MD      . warfarin (COUMADIN) 4 MG tablet Take 1 tablet (4  mg total) by mouth daily. Take as directed by coumadin clinic.  120 tablet  1  . zolpidem (AMBIEN) 5 MG tablet Take 1 tablet (5 mg total) by mouth at bedtime as needed for sleep.  90 tablet  0   No current facility-administered medications for this visit.     Physical Exam: BP 120/74  Pulse 67  Resp 16  Ht 5\' 8"  (1.727 m)  Wt 220 lb (99.791 kg)  BMI 33.46 kg/m2  SpO2 97% He looks well. Cardiac exam shows a regular rate and rhythm with normal heart sounds. There is no murmur, rub, or gallop. Lung exam is clear. Chest incision is well-healed and the sternum is stable. There is no peripheral edema.  Diagnostic Tests:  CLINICAL DATA:  Aortic dissection.   EXAM: CT ANGIOGRAPHY CHEST, ABDOMEN AND PELVIS   TECHNIQUE: Multidetector CT imaging through the chest, abdomen and pelvis was performed using the standard protocol during bolus administration of intravenous contrast. Multiplanar reconstructed images including MIPs were obtained and reviewed to evaluate the  vascular anatomy.   CONTRAST:  80mL OMNIPAQUE IOHEXOL 350 MG/ML SOLN   COMPARISON:  04/15/2012 and earlier studies   FINDINGS: CTA CHEST FINDINGS   Changes of interval median sternotomy and ascending aortic arch repair. Non contrast scan shows extensive coronary calcifications. Displaced intimal calcifications in the descending thoracic aorta are again evident. No hyperdense crescent or mediastinal hematoma.   CTA demonstrates patent proximal and distal anastomoses of the ascending aorta repair. The dissection flap is seen extending from the proximal arch through the length of the descending thoracic aorta. Dissection extends into the right brachiocephalic, subclavian, and axillary arteries with patency of the false lumen. No definite extension into the common carotid or left subclavian arteries, all supplied by the true lumen. There is slow flow evident in the false lumen distally. There is aneurysmal dilatation  of the aortic arch, measuring 5.3 cm proximally, 4.4 cm distal arch/proximal descending, tapering to 3.5 cm in the distal descending segment just above the diaphragm.   No pleural or pericardial effusion. No hilar or mediastinal adenopathy. There is biatrial enlargement. Incomplete opacification of the pulmonary arterial tree ; the exam was not optimized for detection of pulmonary emboli. Mild spondylitic changes noted in the thoracic spine. Linear scarring or subsegmental atelectasis in the lower lingula. Lungs otherwise clear.   Review of the MIP images confirms the above findings.   CTA ABDOMEN AND PELVIS FINDINGS   Arterial findings:   Aorta: The dissection flap extends through the length of the abdominal aorta, which is ectatic, 2.8 cm maximum diameter in the infrarenal segment. True and false lumen remain patent.   Celiac axis: Patent, with extension that dissection flap through the distal celiac axis without evidence of occlusive lesion, classic distal trifurcation anatomy.   Superior mesenteric: Patent, supplied by the false lumen. This dissection flap extends into the 1st 2 cm of the vessel. There is replaced right hepatic arterial supply, an anatomic variant.   Left renal: Single, with dissection flap extending approximately to 1.5 cm into the vessel , patent distally.   Right renal: Single, with dissection flap extending to the bifurcation of the main right renal artery, both true and false lumens patent, no high-grade stenosis.   Inferior mesenteric: Patent. The dissection flap extends just beyond the ostium. No high-grade stenosis.   Left iliac: Dissection flap terminates in the mid common iliac. There is scattered eccentric nonocclusive plaque in the external and internal iliac arteries without aneurysm or stenosis.   Right iliac: The dissection flap does not extend into the right iliac arterial system. There is some scattered eccentric nonocclusive  calcified plaque. No aneurysm or stenosis.   Venous findings: Dedicated venous phase imaging was not obtained.   Review of the MIP images confirms the above findings.   Nonvascular findings: Multiple subcentimeter partial calcified stones layering in the dependent aspect of the nondilated gallbladder. Unremarkable arterial phase evaluation of liver, adrenal glands, pancreas, left kidney. Stable exophytic 14 mm probable cyst from the upper pole right kidney. No hydronephrosis. Stable low-attenuation 14 mm lesion in the posterior aspect of the spleen, otherwise unremarkable. Stomach, small bowel, and colon are nondilated. Normal appendix. Urinary bladder incompletely distended. Moderate prostatic enlargement with placement of metallic fiducial type markers since previous pelvic scan of 03/15/2004. Bilateral pelvic phleboliths. No ascites. No free air. No adenopathy. Degenerative disc disease L4-5 with bilateral facet disease at this level allowing grade 1 anterolisthesis.   IMPRESSION: CTA CHEST IMPRESSION   1. Interval surgical replacement of the ascending aorta  without apparent complication. 2. Persistent dissection flap through the aortic arch and descending segment, involving right innominate, subclavian, and axillary arteries. 3. 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   CTA ABDOMEN AND PELVIS IMPRESSION   1. Extension of dissection flap through the length of the abdominal aorta, involving left common iliac artery, celiac axis, SMA, bilateral renal arteries, and it for mesenteric artery without definite hemodynamically significant lesion. 2. Ectatic abdominal aorta at risk for aneurysm development. Recommend follow up by [place] in 5 years. This recommendation follows ACR consensus guidelines: White Paper of the ACR Incidental Findings Committee II on Vascular Findings. J Am Coll Radiol 2013; 10:789-794. 3. Cholelithiasis   : Review of the MIP images confirms the  above findings.     Electronically Signed   By: Oley Balm M.D.   On: 11/24/2012 11:35    Impression:  He is doing quite well following repair of his acute type A dissection this past February. He seems like he is getting back to his normal lifestyle and is planning to take a three-week cruise in January. I repeated his CT angiogram of the chest and abdomen to followup on his aortic dissection due to the increased risk of aneurysm formation. The aortic arch is aneurysmal at 5.3 cm but is stable compared to his preoperative CT. There is a expected persistent dissection flap noted throughout the aortic arch and descending aorta and this extends out into the innominate, right subclavian, and right axillary arteries. His blood pressure is under good control and he is on a beta blocker.  Plan:  I will plan to repeat his CT angiogram in 1 year and we'll see him back after that has been completed.

## 2012-12-01 ENCOUNTER — Other Ambulatory Visit: Payer: Self-pay | Admitting: Internal Medicine

## 2012-12-01 NOTE — Telephone Encounter (Signed)
Zolpidem called to pharmacy  

## 2012-12-02 DIAGNOSIS — Z23 Encounter for immunization: Secondary | ICD-10-CM | POA: Diagnosis not present

## 2012-12-07 ENCOUNTER — Ambulatory Visit (INDEPENDENT_AMBULATORY_CARE_PROVIDER_SITE_OTHER): Payer: Medicare Other | Admitting: General Practice

## 2012-12-07 DIAGNOSIS — I4891 Unspecified atrial fibrillation: Secondary | ICD-10-CM

## 2012-12-07 DIAGNOSIS — Z7901 Long term (current) use of anticoagulants: Secondary | ICD-10-CM | POA: Diagnosis not present

## 2012-12-07 DIAGNOSIS — I2789 Other specified pulmonary heart diseases: Secondary | ICD-10-CM

## 2012-12-07 DIAGNOSIS — I482 Chronic atrial fibrillation, unspecified: Secondary | ICD-10-CM

## 2012-12-07 DIAGNOSIS — I272 Pulmonary hypertension, unspecified: Secondary | ICD-10-CM

## 2012-12-07 LAB — POCT INR: INR: 2.9

## 2012-12-16 DIAGNOSIS — L608 Other nail disorders: Secondary | ICD-10-CM | POA: Diagnosis not present

## 2012-12-16 DIAGNOSIS — L84 Corns and callosities: Secondary | ICD-10-CM | POA: Diagnosis not present

## 2012-12-16 DIAGNOSIS — I739 Peripheral vascular disease, unspecified: Secondary | ICD-10-CM | POA: Diagnosis not present

## 2012-12-16 DIAGNOSIS — E1159 Type 2 diabetes mellitus with other circulatory complications: Secondary | ICD-10-CM | POA: Diagnosis not present

## 2012-12-31 ENCOUNTER — Ambulatory Visit (INDEPENDENT_AMBULATORY_CARE_PROVIDER_SITE_OTHER): Payer: Medicare Other | Admitting: Cardiology

## 2012-12-31 ENCOUNTER — Encounter: Payer: Self-pay | Admitting: Cardiology

## 2012-12-31 VITALS — BP 114/62 | HR 54 | Ht 68.0 in | Wt 231.0 lb

## 2012-12-31 DIAGNOSIS — Z7901 Long term (current) use of anticoagulants: Secondary | ICD-10-CM | POA: Diagnosis not present

## 2012-12-31 DIAGNOSIS — I482 Chronic atrial fibrillation, unspecified: Secondary | ICD-10-CM

## 2012-12-31 DIAGNOSIS — I10 Essential (primary) hypertension: Secondary | ICD-10-CM | POA: Diagnosis not present

## 2012-12-31 DIAGNOSIS — I4891 Unspecified atrial fibrillation: Secondary | ICD-10-CM

## 2012-12-31 DIAGNOSIS — I5042 Chronic combined systolic (congestive) and diastolic (congestive) heart failure: Secondary | ICD-10-CM

## 2012-12-31 DIAGNOSIS — I509 Heart failure, unspecified: Secondary | ICD-10-CM

## 2012-12-31 NOTE — Progress Notes (Signed)
HPI  Patient is seen today to follow up atrial fibrillation and diastolic heart failure. He has recovered well from his type A aortic dissection that was repaired in February, 2014. He is followed up with Dr. Laneta Simmers. He's doing well. He has gained some weight. It does not appear to be fluid weight. He admits to extra calorie intake.  No Known Allergies  Current Outpatient Prescriptions  Medication Sig Dispense Refill  . acetaminophen (TYLENOL) 325 MG tablet Take 650 mg by mouth every 6 (six) hours as needed for pain.      [name] Kitchen allopurinol (ZYLOPRIM) 300 MG tablet take 1 tablet by mouth once daily  90 tablet  0  . aspirin EC 325 MG EC tablet Take 1 tablet (325 mg total) by mouth daily.  30 tablet    . atorvastatin (LIPITOR) 20 MG tablet take 1 tablet by mouth once daily  90 tablet  0  . Calcium Carbonate-Vitamin D (CALCIUM + D PO) Take 1 tablet by mouth daily.      . finasteride (PROSCAR) 5 MG tablet Take 5 mg by mouth daily.        . furosemide (LASIX) 40 MG tablet Take 0.5 tablets (20 mg total) by mouth daily.  30 tablet  6  . leuprolide (LUPRON) 30 MG injection Inject 30 mg into the muscle every 4 (four) months. Every 4 months      . metoprolol tartrate (LOPRESSOR) 25 MG tablet TAKE 1 TABLET BY MOUTH TWICE A DAY  180 tablet  0  . Multiple Vitamins-Minerals (ONCOVITE PO) Take by mouth.        . pantoprazole (PROTONIX) 40 MG tablet take 1 tablet by mouth once daily  90 tablet  0  . potassium chloride SA (K-DUR,KLOR-CON) 20 MEQ tablet Take 0.5 tablets (10 mEq total) by mouth daily.  30 tablet  6  . senna (SENOKOT) 8.6 MG tablet Take 1 tablet by mouth daily.        . sitaGLIPtin (JANUVIA) 100 MG tablet Take 1 tablet (100 mg total) by mouth daily. PATIENT WANTS GENERIC OF THIS MEDICATION  90 tablet  3  . Tamsulosin HCl (FLOMAX) 0.4 MG CAPS Take 0.4 mg by mouth at bedtime. Restarted flomax  03/12/12 after talking with MD      . warfarin (COUMADIN) 4 MG tablet Take 1 tablet (4 mg total) by mouth  daily. Take as directed by coumadin clinic.  120 tablet  1  . zolpidem (AMBIEN) 5 MG tablet take 1 tablet by mouth at bedtime if needed for sleep  90 tablet  0   No current facility-administered medications for this visit.    History   Social History  . Marital Status: Single    Spouse Name: N/A    Number of Children: 0  . Years of Education: 14   Occupational History  . teacher     retired   Social History Main Topics  . Smoking status: Former Smoker -- 1.00 packs/day    Types: Cigarettes    Quit date: 03/04/1975  . Smokeless tobacco: Never Used  . Alcohol Use: 2.5 oz/week    5 drink(s) per week  . Drug Use: No  . Sexual Activity: Not Currently   Other Topics Concern  . Not on file   Social History Narrative   Confirmed batchelor. Retired Engineer, site. World traveler- Geologist, engineering. I- ADLS. End of life Care   No CPR, no prolonged intubation, no prolonged artificial hydration or feeding, no heroic  or futile measures.       [place] Sept [place]13 [place]          Family History  Problem Relation Age of Onset  . Prostate cancer Father 30    passed with prostate ca    Past Medical History  Diagnosis Date  . Whooping cough   . Bladder cancer     recurrence with TUR-B March '09  . Atrial fibrillation      Chronic,   24 hour holter, September, 2011.... atrial fib rate is controlled.... there is some bradycardia but  no marked pauses  . Diabetes mellitus     type 2  . Hypertension   . DDD (degenerative disc disease)     lumbar spine  . GERD (gastroesophageal reflux disease)   . Gout   . Fluid overload   . ACE-inhibitor cough   . Shortness of breath     on exertion  . Aortic stenosis     mild....echo... september... 2010/ mild... echo.[name] KitchenMarland KitchenDec, 2011  . Pulmonary hypertension     , echo, 01/2010  . Hyperlipidemia   . Hx of colonoscopy     approx. 10 years  . Warfarin anticoagulation     Atrial fib  . Ejection  fraction     EF 60%, echo, 01/2010  . Normal nuclear stress test     06/2005 , also ABI normal 2007  . Hypercholesterolemia   . Fracture of metacarpal bone 11/30/11  . Aortic dissection     Surgical repair February, 2014  . Mitral regurgitation     Past Surgical History  Procedure Laterality Date  . Lumbar laminectomy      '02  . Tur-bt '06, '09    . Bladder cancer biopsy  10/16/2010    negative for malignancy  . Bladder microscopic  04/13/2007    high grade papillary urothelial lesions  . 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 Prostate biopsy  09/25/2011    GLEASON 3+3=6 AND 4+3=7  . Ascending aortic root replacement N/A 04/15/2012    Procedure: ASCENDING AORTIC ROOT REPLACEMENT;  Surgeon: Alleen Borne, MD;  Location: [facility] OR;  Service: Open Heart Surgery;  Laterality: N/A;  . Tee without cardioversion N/A 04/15/2012    Procedure: TRANSESOPHAGEAL ECHOCARDIOGRAM (TEE);  Surgeon: Alleen Borne, MD;  Location: [facility] OR;  Service: Open Heart Surgery;  Laterality: N/A;  . Exploration post operative open heart  04/2012    Patient Active Problem List   Diagnosis Date Noted  . Warfarin anticoagulation     Priority: High  . Chronic combined systolic and diastolic CHF (congestive heart failure) 09/29/2012  . Mitral regurgitation   . Ascending aortic dissection 04/16/2012  . Malignant neoplasm of prostate 10/27/2011  . Hypertension   . GERD (gastroesophageal reflux disease)   . Aortic stenosis   . Pulmonary hypertension   . Hyperlipidemia   . Ejection fraction   . Normal nuclear stress test   . Routine general medical examination at a health care facility 08/22/2010  . Bladder cancer   . Gout   . Chronic atrial fibrillation   . HYPERTROPHY PROSTATE W/O UR OBST & OTH LUTS 09/07/2007  . DEGENERATIVE JOINT DISEASE, GENERALIZED 09/07/2007  . NEOP, MALIGNANT, BLADDER NEC 12/04/2006  . DIABETES MELLITUS, TYPE II 12/04/2006    ROS   Patient denies  fever, chills, headache, sweats, rash, change in vision, change in  hearing, chest pain, cough, nausea or vomiting, urinary symptoms. All other systems are reviewed and are negative.  PHYSICAL EXAM  Patient is oriented to person time and place. Affect is normal. He is overweight. There is no jugulovenous distention. Lungs are clear. Respiratory effort is nonlabored. Cardiac exam reveals S1 and S2. There no clicks or significant murmurs. The abdomen is soft. There is no peripheral edema.  Filed Vitals:   12/31/12 1129  BP: 114/62  Pulse: 54  Height: 5\' 8"  (1.727 m)  Weight: 231 lb (104.781 kg)  SpO2: 94%     ASSESSMENT & PLAN

## 2012-12-31 NOTE — Assessment & Plan Note (Signed)
Atrial fibrillation rate is controlled. No change in therapy. 

## 2012-12-31 NOTE — Assessment & Plan Note (Signed)
He continues on Coumadin for his atrial fib.

## 2012-12-31 NOTE — Patient Instructions (Signed)
Your physician recommends that you continue on your current medications as directed. Please refer to the Current Medication list given to you today.  Your physician wants you to follow-up in: 6 months. You will receive a reminder letter in the mail two months in advance. If you don't receive a letter, please call our office to schedule the follow-up appointment.  

## 2012-12-31 NOTE — Assessment & Plan Note (Signed)
His volume status is stable. His ejection fraction is in the 45-50% range. I've chosen not to change his medicines any further. He is cleared to travel on a cruise.

## 2012-12-31 NOTE — Assessment & Plan Note (Signed)
Blood pressure is nicely controlled. No change in therapy. 

## 2013-01-06 DIAGNOSIS — C61 Malignant neoplasm of prostate: Secondary | ICD-10-CM | POA: Diagnosis not present

## 2013-01-06 DIAGNOSIS — R351 Nocturia: Secondary | ICD-10-CM | POA: Diagnosis not present

## 2013-01-06 DIAGNOSIS — N400 Enlarged prostate without lower urinary tract symptoms: Secondary | ICD-10-CM | POA: Diagnosis not present

## 2013-01-10 ENCOUNTER — Ambulatory Visit (INDEPENDENT_AMBULATORY_CARE_PROVIDER_SITE_OTHER): Payer: Medicare Other | Admitting: Internal Medicine

## 2013-01-10 ENCOUNTER — Encounter: Payer: Self-pay | Admitting: Internal Medicine

## 2013-01-10 ENCOUNTER — Other Ambulatory Visit (INDEPENDENT_AMBULATORY_CARE_PROVIDER_SITE_OTHER): Payer: Medicare Other

## 2013-01-10 VITALS — BP 146/82 | HR 51 | Temp 97.3°F | Wt 231.0 lb

## 2013-01-10 DIAGNOSIS — R7989 Other specified abnormal findings of blood chemistry: Secondary | ICD-10-CM

## 2013-01-10 DIAGNOSIS — E119 Type 2 diabetes mellitus without complications: Secondary | ICD-10-CM | POA: Diagnosis not present

## 2013-01-10 DIAGNOSIS — I10 Essential (primary) hypertension: Secondary | ICD-10-CM

## 2013-01-10 DIAGNOSIS — I7101 Dissection of thoracic aorta: Secondary | ICD-10-CM

## 2013-01-10 DIAGNOSIS — C61 Malignant neoplasm of prostate: Secondary | ICD-10-CM

## 2013-01-10 DIAGNOSIS — N400 Enlarged prostate without lower urinary tract symptoms: Secondary | ICD-10-CM | POA: Diagnosis not present

## 2013-01-10 DIAGNOSIS — E785 Hyperlipidemia, unspecified: Secondary | ICD-10-CM

## 2013-01-10 DIAGNOSIS — C679 Malignant neoplasm of bladder, unspecified: Secondary | ICD-10-CM

## 2013-01-10 DIAGNOSIS — I4891 Unspecified atrial fibrillation: Secondary | ICD-10-CM

## 2013-01-10 DIAGNOSIS — I482 Chronic atrial fibrillation, unspecified: Secondary | ICD-10-CM

## 2013-01-10 LAB — HEPATIC FUNCTION PANEL
ALT: 14 U/L (ref 0–53)
AST: 20 U/L (ref 0–37)
Albumin: 4.1 g/dL (ref 3.5–5.2)
Alkaline Phosphatase: 63 U/L (ref 39–117)
Total Protein: 7.5 g/dL (ref 6.0–8.3)

## 2013-01-10 LAB — HEMOGLOBIN A1C: Hgb A1c MFr Bld: 6.6 % — ABNORMAL HIGH (ref 4.6–6.5)

## 2013-01-10 MED ORDER — PANTOPRAZOLE SODIUM 40 MG PO TBEC: 40.0000 mg | DELAYED_RELEASE_TABLET | Freq: Every day | ORAL | Status: DC

## 2013-01-10 MED ORDER — WARFARIN SODIUM 4 MG PO TABS: 4.0000 mg | ORAL_TABLET | Freq: Every day | ORAL | Status: DC

## 2013-01-10 MED ORDER — ATORVASTATIN CALCIUM 20 MG PO TABS: 20.0000 mg | ORAL_TABLET | Freq: Every day | ORAL | Status: DC

## 2013-01-10 MED ORDER — ALLOPURINOL 300 MG PO TABS: 300.0000 mg | ORAL_TABLET | Freq: Every day | ORAL | Status: DC

## 2013-01-10 NOTE — Progress Notes (Signed)
Pre visit review using our clinic review tool, if applicable. No additional management support is needed unless otherwise documented below in the visit note. 

## 2013-01-10 NOTE — Progress Notes (Signed)
Subjective:    Patient ID: Eric Rivers, male    DOB: October 13, 1930, 79 y.o.   MRN: [id number]  HPI Presents today for routine follow up. He did recently see Dr. Myrtis Ser (12/31/12) and is cleared for taking his next cruise - January '15, to the [place] 21 days. It should be warm.   Feeling well and doing well. He has had some weight gain. He does have a post-prandial cough. Varies in duration and severity. He also c/o xerostomia. Reviewed drugs - no culprit drug.   Past Medical History  Diagnosis Date  . Whooping cough   . Bladder cancer     recurrence with TUR-B March '09  . Atrial fibrillation      Chronic,   24 hour holter, September, 2011.... atrial fib rate is controlled.... there is some bradycardia but  no marked pauses  . Diabetes mellitus     type 2  . Hypertension   . DDD (degenerative disc disease)     lumbar spine  . GERD (gastroesophageal reflux disease)   . Gout   . Fluid overload   . ACE-inhibitor cough   . Shortness of breath     on exertion  . Aortic stenosis     mild....echo... september... 2010/ mild... echo.[name] KitchenMarland KitchenDec, 2011  . Pulmonary hypertension     , echo, 01/2010  . Hyperlipidemia   . Hx of colonoscopy     approx. 10 years  . Warfarin anticoagulation     Atrial fib  . Ejection fraction     EF 60%, echo, 01/2010  . Normal nuclear stress test     06/2005 , also ABI normal 2007  . Hypercholesterolemia   . Fracture of metacarpal bone 11/30/11  . Aortic dissection     Surgical repair February, 2014  . Mitral regurgitation    Past Surgical History  Procedure Laterality Date  . Lumbar laminectomy      '02  . Tur-bt '06, '09    . Bladder cancer biopsy  10/16/2010    negative for malignancy  . Bladder microscopic  04/13/2007    high grade papillary urothelial lesions  . 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 Prostate biopsy  09/25/2011    GLEASON 3+3=6 AND 4+3=7  . Ascending aortic root replacement N/A  04/15/2012    Procedure: ASCENDING AORTIC ROOT REPLACEMENT;  Surgeon: Alleen Borne, MD;  Location: [facility] OR;  Service: Open Heart Surgery;  Laterality: N/A;  . Tee without cardioversion N/A 04/15/2012    Procedure: TRANSESOPHAGEAL ECHOCARDIOGRAM (TEE);  Surgeon: Alleen Borne, MD;  Location: [facility] OR;  Service: Open Heart Surgery;  Laterality: N/A;  . Exploration post operative open heart  04/2012   Family History  Problem Relation Age of Onset  . Prostate cancer Father 38    passed with prostate ca   History   Social History  . Marital Status: Single    Spouse Name: N/A    Number of Children: 0  . Years of Education: 76   Occupational History  . teacher     retired   Social History Main Topics  . Smoking status: Former Smoker -- 1.00 packs/day    Types: Cigarettes    Quit date: 03/04/1975  . Smokeless tobacco: Never Used  . Alcohol Use: 2.5 oz/week    5 drink(s) per week  . Drug Use: No  . Sexual Activity: Not Currently   Other Topics Concern  . Not on  file   Social History Narrative   Confirmed batchelor. Retired Engineer, site. World traveler- Geologist, engineering. I- ADLS. End of life Care   No CPR, no prolonged intubation, no prolonged artificial hydration or feeding, no heroic or futile measures.       Next trip Sept [place]13 [place]          Current Outpatient Prescriptions on File Prior to Visit  Medication Sig Dispense Refill  . acetaminophen (TYLENOL) 325 MG tablet Take 650 mg by mouth every 6 (six) hours as needed for pain.      [name] Kitchen aspirin EC 325 MG EC tablet Take 1 tablet (325 mg total) by mouth daily.  30 tablet    . Calcium Carbonate-Vitamin D (CALCIUM + D PO) Take 1 tablet by mouth daily.      . finasteride (PROSCAR) 5 MG tablet Take 5 mg by mouth daily.        . furosemide (LASIX) 40 MG tablet Take 0.5 tablets (20 mg total) by mouth daily.  30 tablet  6  . leuprolide (LUPRON) 30 MG injection Inject 30 mg into the muscle every 4  (four) months. Every 4 months      . metoprolol tartrate (LOPRESSOR) 25 MG tablet TAKE 1 TABLET BY MOUTH TWICE A DAY  180 tablet  0  . Multiple Vitamins-Minerals (ONCOVITE PO) Take by mouth.        . potassium chloride SA (K-DUR,KLOR-CON) 20 MEQ tablet Take 0.5 tablets (10 mEq total) by mouth daily.  30 tablet  6  . senna (SENOKOT) 8.6 MG tablet Take 1 tablet by mouth daily.        . sitaGLIPtin (JANUVIA) 100 MG tablet Take 1 tablet (100 mg total) by mouth daily. PATIENT WANTS GENERIC OF THIS MEDICATION  90 tablet  3  . Tamsulosin HCl (FLOMAX) 0.4 MG CAPS Take 0.4 mg by mouth at bedtime. Restarted flomax  03/12/12 after talking with MD      . zolpidem (AMBIEN) 5 MG tablet take 1 tablet by mouth at bedtime if needed for sleep  90 tablet  0   No current facility-administered medications on file prior to visit.     Review of Systems System review is negative for any constitutional, cardiac, pulmonary, GI or neuro symptoms or complaints other than as described in the HPI.     Objective:   Physical Exam Filed Vitals:   01/10/13 1304  BP: 146/82  Pulse: 51  Temp: 97.3 F (36.3 C)   Wt Readings from Last 3 Encounters:  01/10/13 231 lb (104.781 kg)  12/31/12 231 lb (104.781 kg)  11/24/12 220 lb (99.791 kg)   Gen'l - WNWD man looking younger than his stated age HEENT - C&S clear, PERRLA Cor - RRR Pulm - normal respirations Neuro - A&O x 3, cognition normal.       Assessment & Plan:

## 2013-01-10 NOTE — Patient Instructions (Signed)
You seem to be doing well and are ship shape to ship out in January.   Diabetes - will check the A1C today along with liver functions (elevarted last Feb) and a uric acid level.  Cardiac stable.  For the dry mouth: try over the counter glycerin swabs and lip balm.  Continue all medications.

## 2013-01-11 ENCOUNTER — Telehealth: Payer: Self-pay | Admitting: Internal Medicine

## 2013-01-11 NOTE — Assessment & Plan Note (Signed)
Stable symptoms on present medical regimen. Followed by Dr. Margarita Grizzle

## 2013-01-11 NOTE — Assessment & Plan Note (Signed)
Doing great.

## 2013-01-11 NOTE — Assessment & Plan Note (Signed)
Stable and doing well. Followed closely by Dr. Margarita Grizzle.

## 2013-01-11 NOTE — Assessment & Plan Note (Signed)
Current with hormonal therapy and completed IMRT. He is followed closely by Dr. Margarita Grizzle

## 2013-01-11 NOTE — Telephone Encounter (Signed)
documented

## 2013-01-11 NOTE — Telephone Encounter (Signed)
Pt called to inform Dr. Debby Bud that he got a flu shot on 12/02/12 at [facility] on [address].

## 2013-01-11 NOTE — Assessment & Plan Note (Signed)
Lab Results  Component Value Date   HGBA1C 6.6* 01/10/2013   Good control on present regimen

## 2013-01-11 NOTE — Assessment & Plan Note (Signed)
BP Readings from Last 3 Encounters:  01/10/13 146/82  12/31/12 114/62  11/24/12 120/74   Adequate control on present medications - no changes at this time

## 2013-01-11 NOTE — Assessment & Plan Note (Signed)
Last lab July '14 - excellent control.   Due for follow up lab July '15

## 2013-01-11 NOTE — Assessment & Plan Note (Signed)
Recent visit with Dr. Myrtis Ser for cardiology - doing well. No change in medication

## 2013-01-13 DIAGNOSIS — C61 Malignant neoplasm of prostate: Secondary | ICD-10-CM | POA: Diagnosis not present

## 2013-01-14 ENCOUNTER — Encounter: Payer: Self-pay | Admitting: Internal Medicine

## 2013-01-18 ENCOUNTER — Ambulatory Visit (INDEPENDENT_AMBULATORY_CARE_PROVIDER_SITE_OTHER): Payer: Medicare Other | Admitting: General Practice

## 2013-01-18 DIAGNOSIS — Z7901 Long term (current) use of anticoagulants: Secondary | ICD-10-CM | POA: Diagnosis not present

## 2013-01-18 DIAGNOSIS — I2789 Other specified pulmonary heart diseases: Secondary | ICD-10-CM

## 2013-01-18 DIAGNOSIS — I4891 Unspecified atrial fibrillation: Secondary | ICD-10-CM

## 2013-01-18 DIAGNOSIS — I482 Chronic atrial fibrillation, unspecified: Secondary | ICD-10-CM

## 2013-01-18 DIAGNOSIS — I272 Pulmonary hypertension, unspecified: Secondary | ICD-10-CM

## 2013-01-18 NOTE — Progress Notes (Signed)
Pre-visit discussion using our clinic review tool. No additional management support is needed unless otherwise documented below in the visit note.  

## 2013-02-05 ENCOUNTER — Other Ambulatory Visit: Payer: Self-pay | Admitting: Internal Medicine

## 2013-02-08 ENCOUNTER — Telehealth: Payer: Self-pay | Admitting: Cardiology

## 2013-02-08 DIAGNOSIS — R310 Gross hematuria: Secondary | ICD-10-CM | POA: Diagnosis not present

## 2013-02-08 DIAGNOSIS — R351 Nocturia: Secondary | ICD-10-CM | POA: Diagnosis not present

## 2013-02-08 DIAGNOSIS — C674 Malignant neoplasm of posterior wall of bladder: Secondary | ICD-10-CM | POA: Diagnosis not present

## 2013-02-08 NOTE — Telephone Encounter (Signed)
New problem    Need to come off Asa & coumadin for 5 days prior .   From last office notes can patient be clear for surgery  - bladder cancer.

## 2013-02-08 NOTE — Telephone Encounter (Signed)
[name] from [facility] urology called.  Pt needs to be clear for surgery for bladder cancer. Pt needs  to be off coumadin and Asa for 5 days prior the procedure. I left a detail message that this message will be send to Dr. Myrtis Ser for recommendations.

## 2013-02-11 NOTE — Telephone Encounter (Signed)
Lm w/[facility] that Dr. Myrtis Ser signed clearance today and would be faxed today.  Dr. Myrtis Ser states that pt can stop Coumadin prior to procedure. Will also notify pt that can stop Coumadin.

## 2013-02-11 NOTE — Telephone Encounter (Signed)
F/u    Need to know status of medical clearance. Please advise

## 2013-02-14 ENCOUNTER — Other Ambulatory Visit: Payer: Self-pay | Admitting: Urology

## 2013-02-15 ENCOUNTER — Ambulatory Visit (INDEPENDENT_AMBULATORY_CARE_PROVIDER_SITE_OTHER): Payer: Medicare Other | Admitting: General Practice

## 2013-02-15 ENCOUNTER — Other Ambulatory Visit: Payer: Self-pay | Admitting: Internal Medicine

## 2013-02-15 DIAGNOSIS — I2789 Other specified pulmonary heart diseases: Secondary | ICD-10-CM | POA: Diagnosis not present

## 2013-02-15 DIAGNOSIS — Z7901 Long term (current) use of anticoagulants: Secondary | ICD-10-CM | POA: Diagnosis not present

## 2013-02-15 DIAGNOSIS — I272 Pulmonary hypertension, unspecified: Secondary | ICD-10-CM

## 2013-02-15 LAB — POCT INR: INR: 3.1

## 2013-02-15 NOTE — Patient Instructions (Signed)
Instructions for coumadin pre and post procedure.  1/28 - Take last dose of coumadin until after procedure 2/2 - Procedure 2/3 - Take 6 mg of coumadin 2/4 - Take 6 mg of coumadin 2/5 - Resume taking 4 mg all days.  Re-check on 2/10

## 2013-02-15 NOTE — Progress Notes (Signed)
Pre-visit discussion using our clinic review tool. No additional management support is needed unless otherwise documented below in the visit note.  

## 2013-03-01 DIAGNOSIS — E1159 Type 2 diabetes mellitus with other circulatory complications: Secondary | ICD-10-CM | POA: Diagnosis not present

## 2013-03-01 DIAGNOSIS — I739 Peripheral vascular disease, unspecified: Secondary | ICD-10-CM | POA: Diagnosis not present

## 2013-03-01 DIAGNOSIS — L608 Other nail disorders: Secondary | ICD-10-CM | POA: Diagnosis not present

## 2013-03-28 DIAGNOSIS — C674 Malignant neoplasm of posterior wall of bladder: Secondary | ICD-10-CM | POA: Diagnosis not present

## 2013-03-29 ENCOUNTER — Encounter (HOSPITAL_COMMUNITY): Payer: Self-pay | Admitting: Pharmacy Technician

## 2013-03-30 ENCOUNTER — Encounter (HOSPITAL_COMMUNITY): Payer: Self-pay

## 2013-03-30 ENCOUNTER — Encounter (HOSPITAL_COMMUNITY)
Admission: RE | Admit: 2013-03-30 | Discharge: 2013-03-30 | Disposition: A | Discharge status: Home / Self Care | Payer: Medicare Other | Source: Ambulatory Visit | Attending: Urology | Admitting: Urology

## 2013-03-30 DIAGNOSIS — Z01818 Encounter for other preprocedural examination: Secondary | ICD-10-CM | POA: Insufficient documentation

## 2013-03-30 DIAGNOSIS — Z01812 Encounter for preprocedural laboratory examination: Secondary | ICD-10-CM | POA: Insufficient documentation

## 2013-03-30 HISTORY — DX: Malignant neoplasm of prostate: C61

## 2013-03-30 LAB — BASIC METABOLIC PANEL
BUN: 16 mg/dL (ref 6–23)
CHLORIDE: 98 meq/L (ref 96–112)
CO2: 30 mEq/L (ref 19–32)
Calcium: 9 mg/dL (ref 8.4–10.5)
Creatinine, Ser: 0.93 mg/dL (ref 0.50–1.35)
GFR calc non Af Amer: 77 mL/min — ABNORMAL LOW (ref >90)
GFR, EST AFRICAN AMERICAN: 90 mL/min — AB (ref >90)
Glucose, Bld: 107 mg/dL — ABNORMAL HIGH (ref 70–99)
POTASSIUM: 4.7 meq/L (ref 3.7–5.3)
SODIUM: 140 meq/L (ref 137–147)

## 2013-03-30 LAB — PROTIME-INR
INR: 3.58 — AB (ref 0.00–1.49)
PROTHROMBIN TIME: 34.4 s — AB (ref 11.6–15.2)

## 2013-03-30 LAB — CBC
HEMATOCRIT: 34.7 % — AB (ref 39.0–52.0)
Hemoglobin: 11 g/dL — ABNORMAL LOW (ref 13.0–17.0)
MCH: 30 pg (ref 26.0–34.0)
MCHC: 31.7 g/dL (ref 30.0–36.0)
MCV: 94.6 fL (ref 78.0–100.0)
Platelets: 292 10*3/uL (ref 150–400)
RBC: 3.67 MIL/uL — ABNORMAL LOW (ref 4.22–5.81)
RDW: 15.6 % — AB (ref 11.5–15.5)
WBC: 8.1 10*3/uL (ref 4.0–10.5)

## 2013-03-30 LAB — APTT: APTT: 62 s — AB (ref 24–37)

## 2013-03-30 NOTE — Pre-Procedure Instructions (Signed)
03-30-13 EKG 6'14/ CT Chest 9'14 -Epic.

## 2013-03-30 NOTE — Patient Instructions (Addendum)
Eric Rivers  03/30/2013   Your procedure is scheduled on:  2- 2   -2015  Report to [facility] at     1000   AM .  Call this number if you have problems the morning of surgery: [phone]  Or Presurgical Testing [phone](Vang Kraeger) For Living Will and/or Health Care Power Attorney Forms: please provide copy for your medical record,may bring AM of surgery(Forms should be already notarized -we do not provide this service).  Remember: Follow any bowel prep instructions per MD office. For Cpap use: Bring mask and tubing only.   Do not eat food:After Midnight.  May have clear liquids:up to 6 Hours before arrival. Nothing after : 0600  Clear liquids include soda, tea, black coffee, apple or grape juice, broth.  Take these medicines the morning of surgery with A SIP OF WATER: Allopurinol. Lipitor. Finasteride. Metoprolol. Pantoprazole. Stop Coumadin, aspirin as directed per physician. Do not take any Diabetic meds AM of.   Do not wear jewelry, make-up or nail polish.  Do not wear lotions, powders, or perfumes. You may wear deodorant.  Do not shave 12 hours prior to first CHG shower(legs and under arms).(face and neck okay.)  Do not bring valuables to the hospital.(Hospital is not responsible for lost valuables).  Contacts, dentures or removable bridgework, body piercing, hair pins may not be worn into surgery.  Leave suitcase in the car. After surgery it may be brought to your room.  For patients admitted to the hospital, checkout time is 11:00 AM the day of discharge.(Restricted visitors-Persons, age 10 or younger - may not visit at this time.)    Patients discharged the day of surgery will not be allowed to drive home. Must have responsible person with you x 24 hours once discharged.  Name and phone number of your driver: Lawernce Keas phone number will be provided  Special Instructions: CHG(Chlorhedine 4%-"Hibiclens","Betasept","Aplicare") Shower Use Special Wash: see  special instructions.(avoid face and genitals)    Failure to follow these instructions may result in Cancellation of your surgery.   Patient signature_______________________________________________________

## 2013-04-04 ENCOUNTER — Encounter (HOSPITAL_COMMUNITY)
Admission: RE | Disposition: A | Discharge status: Home / Self Care | Payer: Self-pay | Source: Ambulatory Visit | Attending: Urology

## 2013-04-04 ENCOUNTER — Ambulatory Visit (HOSPITAL_COMMUNITY)
Admission: RE | Admit: 2013-04-04 | Discharge: 2013-04-04 | Disposition: A | Discharge status: Home / Self Care | Payer: Medicare Other | Source: Ambulatory Visit | Attending: Urology | Admitting: Urology

## 2013-04-04 ENCOUNTER — Ambulatory Visit (HOSPITAL_COMMUNITY): Payer: Medicare Other

## 2013-04-04 ENCOUNTER — Encounter (HOSPITAL_COMMUNITY): Payer: Medicare Other | Admitting: Registered Nurse

## 2013-04-04 ENCOUNTER — Encounter (HOSPITAL_COMMUNITY): Payer: Self-pay | Admitting: *Deleted

## 2013-04-04 ENCOUNTER — Ambulatory Visit (HOSPITAL_COMMUNITY): Payer: Medicare Other | Admitting: Registered Nurse

## 2013-04-04 DIAGNOSIS — R310 Gross hematuria: Secondary | ICD-10-CM | POA: Diagnosis not present

## 2013-04-04 DIAGNOSIS — I4891 Unspecified atrial fibrillation: Secondary | ICD-10-CM | POA: Insufficient documentation

## 2013-04-04 DIAGNOSIS — I2789 Other specified pulmonary heart diseases: Secondary | ICD-10-CM | POA: Diagnosis not present

## 2013-04-04 DIAGNOSIS — C679 Malignant neoplasm of bladder, unspecified: Secondary | ICD-10-CM

## 2013-04-04 DIAGNOSIS — E119 Type 2 diabetes mellitus without complications: Secondary | ICD-10-CM | POA: Insufficient documentation

## 2013-04-04 DIAGNOSIS — I5042 Chronic combined systolic (congestive) and diastolic (congestive) heart failure: Secondary | ICD-10-CM | POA: Diagnosis not present

## 2013-04-04 DIAGNOSIS — N2889 Other specified disorders of kidney and ureter: Secondary | ICD-10-CM | POA: Diagnosis not present

## 2013-04-04 DIAGNOSIS — N329 Bladder disorder, unspecified: Secondary | ICD-10-CM | POA: Diagnosis not present

## 2013-04-04 DIAGNOSIS — I509 Heart failure, unspecified: Secondary | ICD-10-CM | POA: Insufficient documentation

## 2013-04-04 DIAGNOSIS — K219 Gastro-esophageal reflux disease without esophagitis: Secondary | ICD-10-CM | POA: Diagnosis not present

## 2013-04-04 DIAGNOSIS — Z79899 Other long term (current) drug therapy: Secondary | ICD-10-CM | POA: Insufficient documentation

## 2013-04-04 DIAGNOSIS — I10 Essential (primary) hypertension: Secondary | ICD-10-CM | POA: Diagnosis not present

## 2013-04-04 DIAGNOSIS — N3289 Other specified disorders of bladder: Secondary | ICD-10-CM | POA: Diagnosis not present

## 2013-04-04 DIAGNOSIS — Z87891 Personal history of nicotine dependence: Secondary | ICD-10-CM | POA: Insufficient documentation

## 2013-04-04 HISTORY — PX: CYSTOSCOPY/RETROGRADE/URETEROSCOPY: SHX5316

## 2013-04-04 LAB — GLUCOSE, CAPILLARY
GLUCOSE-CAPILLARY: 98 mg/dL (ref 70–99)
Glucose-Capillary: 134 mg/dL — ABNORMAL HIGH (ref 70–99)

## 2013-04-04 LAB — PROTIME-INR
INR: 1.37 (ref 0.00–1.49)
PROTHROMBIN TIME: 16.5 s — AB (ref 11.6–15.2)

## 2013-04-04 SURGERY — CYSTOSCOPY/RETROGRADE/URETEROSCOPY
Anesthesia: General | Laterality: Bilateral

## 2013-04-04 MED ORDER — PHENAZOPYRIDINE HCL 100 MG PO TABS: 100.0000 mg | ORAL_TABLET | Freq: Three times a day (TID) | ORAL | Status: DC | PRN

## 2013-04-04 MED ORDER — GLYCOPYRROLATE 0.2 MG/ML IJ SOLN
INTRAMUSCULAR | Status: AC
  Filled 2013-04-04: qty 1

## 2013-04-04 MED ORDER — LIDOCAINE HCL 2 % EX GEL
CUTANEOUS | Status: AC
  Filled 2013-04-04: qty 10

## 2013-04-04 MED ORDER — PROPOFOL 10 MG/ML IV BOLUS
INTRAVENOUS | Status: AC
  Filled 2013-04-04: qty 20

## 2013-04-04 MED ORDER — CEFAZOLIN SODIUM-DEXTROSE 2-3 GM-% IV SOLR
2.0000 g | INTRAVENOUS | Status: AC
  Administered 2013-04-04: 2 g via INTRAVENOUS

## 2013-04-04 MED ORDER — IOHEXOL 300 MG/ML  SOLN
INTRAMUSCULAR | Status: DC | PRN
  Administered 2013-04-04: 13 mL via URETHRAL

## 2013-04-04 MED ORDER — OXYBUTYNIN CHLORIDE 5 MG PO TABS: 5.0000 mg | ORAL_TABLET | Freq: Four times a day (QID) | ORAL | Status: DC | PRN

## 2013-04-04 MED ORDER — LIDOCAINE HCL 2 % EX GEL
CUTANEOUS | Status: DC | PRN
  Administered 2013-04-04: 1 via URETHRAL

## 2013-04-04 MED ORDER — FENTANYL CITRATE 0.05 MG/ML IJ SOLN
INTRAMUSCULAR | Status: DC | PRN
  Administered 2013-04-04 (×2): 50 ug via INTRAVENOUS

## 2013-04-04 MED ORDER — BELLADONNA ALKALOIDS-OPIUM 16.2-60 MG RE SUPP
RECTAL | Status: AC
  Filled 2013-04-04: qty 1

## 2013-04-04 MED ORDER — FENTANYL CITRATE 0.05 MG/ML IJ SOLN
INTRAMUSCULAR | Status: AC
  Filled 2013-04-04: qty 2

## 2013-04-04 MED ORDER — PROPOFOL 10 MG/ML IV BOLUS
INTRAVENOUS | Status: DC | PRN
  Administered 2013-04-04: 200 mg via INTRAVENOUS

## 2013-04-04 MED ORDER — SODIUM CHLORIDE 0.9 % IR SOLN
Status: DC | PRN
  Administered 2013-04-04: 2000 mL

## 2013-04-04 MED ORDER — LACTATED RINGERS IV SOLN
INTRAVENOUS | Status: DC
  Administered 2013-04-04: 1000 mL via INTRAVENOUS

## 2013-04-04 MED ORDER — HYDROCODONE-ACETAMINOPHEN 5-325 MG PO TABS: 1.0000 | ORAL_TABLET | ORAL | Status: DC | PRN

## 2013-04-04 MED ORDER — GLYCOPYRROLATE 0.2 MG/ML IJ SOLN
INTRAMUSCULAR | Status: DC | PRN
  Administered 2013-04-04: 0.2 mg via INTRAVENOUS
  Administered 2013-04-04: .2 mg via INTRAVENOUS

## 2013-04-04 MED ORDER — ONDANSETRON HCL 4 MG/2ML IJ SOLN
INTRAMUSCULAR | Status: DC | PRN
  Administered 2013-04-04: 4 mg via INTRAVENOUS

## 2013-04-04 MED ORDER — ONDANSETRON HCL 4 MG/2ML IJ SOLN
INTRAMUSCULAR | Status: AC
  Filled 2013-04-04: qty 2

## 2013-04-04 MED ORDER — CEPHALEXIN 500 MG PO CAPS: 500.0000 mg | ORAL_CAPSULE | Freq: Three times a day (TID) | ORAL | Status: DC

## 2013-04-04 MED ORDER — FENTANYL CITRATE 0.05 MG/ML IJ SOLN: 25.0000 ug | INTRAMUSCULAR | Status: DC | PRN

## 2013-04-04 MED ORDER — BELLADONNA ALKALOIDS-OPIUM 16.2-60 MG RE SUPP
RECTAL | Status: DC | PRN
  Administered 2013-04-04: 1 via RECTAL

## 2013-04-04 MED ORDER — CEFAZOLIN SODIUM-DEXTROSE 2-3 GM-% IV SOLR
INTRAVENOUS | Status: AC
  Filled 2013-04-04: qty 50

## 2013-04-04 MED ORDER — OXYBUTYNIN CHLORIDE 5 MG PO TABS
5.0000 mg | ORAL_TABLET | Freq: Three times a day (TID) | ORAL | Status: DC
  Administered 2013-04-04: 5 mg via ORAL
  Filled 2013-04-04: qty 1

## 2013-04-04 MED ORDER — LACTATED RINGERS IV SOLN: INTRAVENOUS | Status: DC

## 2013-04-04 MED ORDER — LIDOCAINE HCL (CARDIAC) 20 MG/ML IV SOLN
INTRAVENOUS | Status: DC | PRN
  Administered 2013-04-04: 80 mg via INTRAVENOUS

## 2013-04-04 MED ORDER — LIDOCAINE HCL (CARDIAC) 20 MG/ML IV SOLN
INTRAVENOUS | Status: AC
  Filled 2013-04-04: qty 5

## 2013-04-04 SURGICAL SUPPLY — 15 items
BAG URO CATCHER STRL LF (DRAPE) ×3 IMPLANT
BASKET ZERO TIP NITINOL 2.4FR (BASKET) IMPLANT
BSKT STON RTRVL ZERO TP 2.4FR (BASKET)
CATH INTERMIT  6FR 70CM (CATHETERS) IMPLANT
CATH URET 5FR 28IN OPEN ENDED (CATHETERS) ×2 IMPLANT
DRAPE CAMERA CLOSED 9X96 (DRAPES) ×3 IMPLANT
GLOVE BIOGEL M 7.0 STRL (GLOVE) ×9 IMPLANT
GOWN STRL REUS W/TWL LRG LVL3 (GOWN DISPOSABLE) ×5 IMPLANT
GUIDEWIRE ANG ZIPWIRE 038X150 (WIRE) IMPLANT
GUIDEWIRE STR DUAL SENSOR (WIRE) ×3 IMPLANT
MANIFOLD NEPTUNE II (INSTRUMENTS) ×3 IMPLANT
PACK CYSTO (CUSTOM PROCEDURE TRAY) ×3 IMPLANT
SCRUB PCMX 4 OZ (MISCELLANEOUS) ×3 IMPLANT
TUBING CONNECTING 10 (TUBING) ×2 IMPLANT
TUBING CONNECTING 10' (TUBING) ×1

## 2013-04-04 NOTE — Op Note (Signed)
Urology Operative Report  Date of Procedure: 04/04/13  Surgeon: Rolan Bucco, MD Assistant:  None  Preoperative Diagnosis: Bladder cancer Postoperative Diagnosis:  Same  Procedure(s): Cystoscopy with bladder biopsy. Bilateral retrograde pyelogram with interpretation.  Estimated blood loss: Minimal  Specimen: Bladder biopsy  Drains: None  Complications: None  Findings: Flat erythematous area on the left lateral bladder wall posterior to the left ureter orifice and at the trigone. Negative filling defects of the bilateral upper urinary tracts.  History of present illness: 80 year old male with a history of high-grade bladder cancer presents today for bladder biopsy and bilateral retrograde pyelogram do to a new found bladder lesion on surveillance cystoscopy and gross hematuria.   Procedure in detail: After informed consent was obtained, the patient was taken to the operating room. They were placed in the supine position. SCDs were turned on and in place. IV antibiotics were infused, and general anesthesia was induced. A timeout was performed in which the correct patient, surgical site, and procedure were identified and agreed upon by the team.  The patient was placed in a dorsolithotomy position, making sure to pad all pertinent neurovascular pressure points. The genitals were prepped and draped in the usual sterile fashion.  A rigid cystoscope was advanced through the urethra and into the bladder. The bladder was then drained and then fully distended. It was evaluated in a systematic fashion to visualize the entire surface of the bladder with a 30 and 70 lens. The previously mentioned bladder lesion was flat and erythematous. This was located posterior to the left ureter orifice on the lateral bladder wall. There is also similar area in the trigone. I used a cold cup biopsy forceps to biopsy each of these areas and these were sent for pathology. I then cauterized the area with  Bugbee electrode.  Attention was turned the left ureter orifice. I cannulated it with a 5 [place] ureter catheter and injected 8 cc of Omnipaque to obtain a retrograde pyelogram. This was negative for filling defects or hydronephrosis. This side emptied out promptly. Attention was then turned to the right ureter orifice. It was cannulated with a 5 [place] ureter catheter. I injected 5 cc of Omnipaque to obtain a retrograde pyelogram. This was negative for filling defects or hydronephrosis. This side emptied out promptly.  There was good hemostasis at the biopsy sites. The bladder was drained. I withdrew the cystoscope.  I placed 10 cc of lidocaine jelly into the urethra and a belladonna and opium suppository to rectum.  He's placed back in supine position, anesthesia was reversed, and he was taken to the [facility] in stable condition.  All counts were correct at the end of the case.  He was instructed to restart his warfarin tonight at his usual dose. He received 3 days of Keflex to cover for the instrumentation in addition to the IV about to receive before surgery.

## 2013-04-04 NOTE — Progress Notes (Signed)
Patient instructed in the emptying of leg bag. Instructed to take shower daily and clean penis where catheter enters. He verbalizes understanding. Demonstrates removal of leg strap and emptying of leg bag.

## 2013-04-04 NOTE — Preoperative (Signed)
Beta Blockers   Reason not to administer Beta Blockers:metoprolol taken at 0600 04-04-13

## 2013-04-04 NOTE — Discharge Instructions (Signed)
DISCHARGE INSTRUCTIONS FOR TRANSURETHRAL SURGERY OF BLADDER  MEDICATIONS:   1. Restart your warfarin tonight.  2. Resume all your other meds from home.    ACTIVITY 1. No heavy lifting >10 pounds for 2 weeks 2. No sexual activity for 2 weeks 3. No strenuous activity for 2 weeks 4. No driving while on narcotic pain medications 5. Drink plenty of water 6. Continue to walk at home - you can still get blood clots when you are at  home, so keep active, but don't over do it. 7. Your urine may have some blood in it - make sure you drink plenty of water,  call or come to the ER immediately if your catheter stops draining  FOLEY CATHETER  (If you go home with a catheter in place.) 1. Make sure your catheter is attached to your leg at all times - do not let  anything pull on it 2. If the urine is your tube starts looking dark red or if it stops draining,  call us immediately or come to the ER 3. Drink plenty of water, if you do notice your urine looking darking, sit down,  relax and drink lots of water 4. You will be given a leg bag as well as an overnight bag for your catheter -  MAKE SURE ATTACHED TO YOUR LEG AT ALL TIMES WITH TAPE OR LEG STRAP  BATHING 1. You can shower.  You may take a bath unless you have a Foley catheter in place.  SIGNS/SYMPTOMS TO CALL: 1. Please call us if you have a fever greater than 101.5, uncontrolled  nausea/vomiting, uncontrolled pain, dizziness, unable to urinate, chest pain, shortness of breath, leg swelling, leg pain, or any other concerns  or questions.  You can reach [place] at [phone].

## 2013-04-04 NOTE — Anesthesia Preprocedure Evaluation (Addendum)
Anesthesia Evaluation  Patient identified by MRN, date of birth, ID band Patient awake    Reviewed: Allergy & Precautions, H&P , NPO status , Patient's Chart, lab work & pertinent test results, reviewed documented beta blocker date and time   Airway Mallampati: II TM Distance: >3 FB Neck ROM: full    Dental  (+) Caps and Dental Advisory Given One cap lower front:   Pulmonary shortness of breath and with exertion, former smoker,  Pulmonary hypertension breath sounds clear to auscultation  Pulmonary exam normal       Cardiovascular hypertension, Pt. on home beta blockers +CHF + dysrhythmias Atrial Fibrillation + Valvular Problems/Murmurs AS and MR Rhythm:Irregular Rate:Normal + Systolic murmurs Combined systolic and diastolic heart failure. ECG AF   Neuro/Psych negative neurological ROS  negative psych ROS   GI/Hepatic negative GI ROS, Neg liver ROS, GERD-  Medicated and Controlled,  Endo/Other  diabetes, Well Controlled, Type 2, Oral Hypoglycemic Agents  Renal/GU negative Renal ROS  negative genitourinary   Musculoskeletal   Abdominal   Peds  Hematology negative hematology ROS (+)   Anesthesia Other Findings   Reproductive/Obstetrics negative OB ROS                         Anesthesia Physical Anesthesia Plan  ASA: IV  Anesthesia Plan: General   Post-op Pain Management:    Induction: Intravenous  Airway Management Planned: LMA  Additional Equipment:   Intra-op Plan:   Post-operative Plan:   Informed Consent: I have reviewed the patients History and Physical, chart, labs and discussed the procedure including the risks, benefits and alternatives for the proposed anesthesia with the patient or authorized representative who has indicated his/her understanding and acceptance.   Dental Advisory Given  Plan Discussed with: CRNA and Surgeon  Anesthesia Plan Comments:          Anesthesia Quick Evaluation

## 2013-04-04 NOTE — Transfer of Care (Signed)
Immediate Anesthesia Transfer of Care Note  Patient: Eric Rivers  Procedure(s) Performed: Procedure(s): CYSTOSCOPY WITH RETROGRADE PYELOGRAM AND BLADDER BIOPSY (Bilateral)  Patient Location: PACU  Anesthesia Type:General  Level of Consciousness: awake, alert , oriented and patient cooperative  Airway & Oxygen Therapy: Patient Spontanous Breathing and Patient connected to face mask oxygen  Post-op Assessment: Report given to PACU RN, Post -op Vital signs reviewed and stable and Patient moving all extremities  Post vital signs: Reviewed and stable  Complications: No apparent anesthesia complications

## 2013-04-04 NOTE — Progress Notes (Signed)
Spoke with patient about past history ever unable to void go home with foley. He has went home with foley before. States he is OK to go home with foley and return to office to have cath removed. Dr. [name] paged.

## 2013-04-04 NOTE — Progress Notes (Signed)
Office nurse ashley to beep Dr. Roland Rack.

## 2013-04-04 NOTE — Anesthesia Postprocedure Evaluation (Signed)
  Anesthesia Post-op Note  Patient: Eric Rivers  Procedure(s) Performed: Procedure(s) (LRB): CYSTOSCOPY WITH RETROGRADE PYELOGRAM AND BLADDER BIOPSY (Bilateral)  Patient Location: PACU  Anesthesia Type: General  Level of Consciousness: awake and alert   Airway and Oxygen Therapy: Patient Spontanous Breathing  Post-op Pain: mild  Post-op Assessment: Post-op Vital signs reviewed, Patient's Cardiovascular Status Stable, Respiratory Function Stable, Patent Airway and No signs of Nausea or vomiting  Last Vitals:  Filed Vitals:   04/04/13 1415  BP: 123/69  Pulse: 67  Temp: 36.4 C  Resp: 16    Post-op Vital Signs: stable   Complications: No apparent anesthesia complications

## 2013-04-04 NOTE — H&P (Signed)
Urology History and Physical Exam  CC: Bladder cancer  HPI:  80 year old male presents today with history of bladder cancer and a bladder tumor.  He had gross hematuria in late 2014 and a cystoscopy in early December 2014 revealed a bladder tumor.  This is possibly 1 cm in size.  It was slightly superior to the left ureter orifice.  There were no other tumors anywhere else in the bladder.  As mentioned was social for symmetry which resolved.  She has a history of high-grade bladder cancer back in 2006.  He has been managed with intravesical BCG and cystoscopies.  He presents today for cystoscopy, transurethral resection of bladder tumor, bilateral retrograde pyelograms.  We have discussed the risks, benefits, alternatives, and likely the achieving goals.  UA 1+26/15 was negative for signs of infection. He has A-fib; cleared by his cardiologist, Dr. Ron Parker, to hold coumadin 5 days prior.  PMH: Past Medical History  Diagnosis Date  . Whooping cough   . Atrial fibrillation      Chronic,   24 hour holter, September, 2011.... atrial fib rate is controlled.... there is some bradycardia but  no marked pauses  . Diabetes mellitus     type 2  . Hypertension   . DDD (degenerative disc disease)     lumbar spine  . GERD (gastroesophageal reflux disease)   . Gout   . Fluid overload   . ACE-inhibitor cough   . Shortness of breath     on exertion  . Aortic stenosis     mild....echo... september... 2010/ mild... echo.[name] KitchenMarland KitchenDec, 2011  . Pulmonary hypertension     46mmHg, echo, 01/2010  . Hyperlipidemia   . Hx of colonoscopy     approx. 10 years  . Warfarin anticoagulation     Atrial fib  . Ejection fraction     EF 60%, echo, 01/2010  . Normal nuclear stress test     06/2005 , also ABI normal 2007  . Hypercholesterolemia   . Fracture of metacarpal bone 11/30/11  . Aortic dissection     Surgical repair February, 2014  . Mitral regurgitation   . Bladder cancer     recurrence with TUR-B March '09   . Prostate cancer     radiation tx.    PSH: Past Surgical History  Procedure Laterality Date  . Lumbar laminectomy      '02  . Tur-bt '06, '09    . Bladder cancer biopsy  10/16/2010    negative for malignancy  . Bladder microscopic  04/13/2007    high grade papillary urothelial lesions  . 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 Prostate biopsy  09/25/2011    GLEASON 3+3=6 AND 4+3=7  . Ascending aortic root replacement N/A 04/15/2012    Procedure: ASCENDING AORTIC ROOT REPLACEMENT;  Surgeon: Gaye Pollack, MD;  Location: [facility];  Service: Open Heart Surgery;  Laterality: N/A;  . Tee without cardioversion N/A 04/15/2012    Procedure: TRANSESOPHAGEAL ECHOCARDIOGRAM (TEE);  Surgeon: Gaye Pollack, MD;  Location: [facility];  Service: Open Heart Surgery;  Laterality: N/A;  . Exploration post operative open heart  04/2012    Allergies: No Known Allergies  Medications: No prescriptions prior to admission     Social History: History   Social History  . Marital Status: Single    Spouse Name: N/A    Number of Children: 0  . Years of Education: 57   Occupational History  .  teacher     retired   Social History Main Topics  . Smoking status: Former Smoker -- 1.00 packs/day    Types: Cigarettes    Quit date: 03/04/1975  . Smokeless tobacco: Never Used  . Alcohol Use: 2.5 oz/week    5 drink(s) per week     Comment: liquor daily  . Drug Use: No  . Sexual Activity: Not Currently   Other Topics Concern  . Not on file   Social History Narrative   Confirmed batchelor. Retired Education officer, museum. World traveler- Ambulance person. I- ADLS. End of life Care   No CPR, no prolonged intubation, no prolonged artificial hydration or feeding, no heroic or futile measures.       [place]13 [place]          Family History: Family History  Problem Relation Age of Onset  . Prostate cancer Father 79    passed  with prostate ca    Review of Systems: Positive: None. Negative: Fever, SOB, or chest pain.  A further 10 point review of systems was negative except what is listed in the HPI.  Physical Exam: There were no vitals filed for this visit.  General: No acute distress.  Awake. Head:  Normocephalic.  Atraumatic. ENT:  EOMI.  Mucous membranes moist Neck:  Supple.  No lymphadenopathy. CV:  S1 present. S2 present. Regular rate. Pulmonary: Equal effort bilaterally.  Clear to auscultation bilaterally. Abdomen: Soft.  Non- tender to palpation. Skin:  Normal turgor.  No visible rash. Extremity: No gross deformity of bilateral upper extremities.  No gross deformity of    bilateral lower extremities. Neurologic: Alert. Appropriate mood.    Studies:  No results found for this basename: HGB, WBC, PLT,  in the last 72 hours  No results found for this basename: NA, K, CL, CO2, BUN, CREATININE, CALCIUM, MAGNESIUM, GFRNONAA, GFRAA,  in the last 72 hours   No results found for this basename: PT, INR, APTT,  in the last 72 hours   No components found with this basename: ABG,     Assessment:  Bladder cancer.  Plan: To OR for cystoscopy, transurethral resection of bladder tumor, bilateral retrograde pyelograms.  We have discussed the risks, benefits, alternatives, and likely the achieving goals.

## 2013-04-05 ENCOUNTER — Ambulatory Visit: Payer: Medicare Other | Admitting: Internal Medicine

## 2013-04-05 ENCOUNTER — Encounter (HOSPITAL_COMMUNITY): Payer: Self-pay | Admitting: Urology

## 2013-04-08 DIAGNOSIS — Z8546 Personal history of malignant neoplasm of prostate: Secondary | ICD-10-CM | POA: Diagnosis not present

## 2013-04-08 DIAGNOSIS — R351 Nocturia: Secondary | ICD-10-CM | POA: Diagnosis not present

## 2013-04-08 DIAGNOSIS — C679 Malignant neoplasm of bladder, unspecified: Secondary | ICD-10-CM | POA: Diagnosis not present

## 2013-04-11 ENCOUNTER — Ambulatory Visit (INDEPENDENT_AMBULATORY_CARE_PROVIDER_SITE_OTHER): Payer: Medicare Other | Admitting: Internal Medicine

## 2013-04-11 ENCOUNTER — Encounter: Payer: Self-pay | Admitting: Internal Medicine

## 2013-04-11 ENCOUNTER — Telehealth: Payer: Self-pay | Admitting: *Deleted

## 2013-04-11 VITALS — BP 154/90 | HR 65

## 2013-04-11 DIAGNOSIS — C679 Malignant neoplasm of bladder, unspecified: Secondary | ICD-10-CM

## 2013-04-11 DIAGNOSIS — Z0000 Encounter for general adult medical examination without abnormal findings: Secondary | ICD-10-CM

## 2013-04-11 DIAGNOSIS — E785 Hyperlipidemia, unspecified: Secondary | ICD-10-CM

## 2013-04-11 DIAGNOSIS — G4700 Insomnia, unspecified: Secondary | ICD-10-CM

## 2013-04-11 DIAGNOSIS — Z7189 Other specified counseling: Secondary | ICD-10-CM

## 2013-04-11 DIAGNOSIS — I10 Essential (primary) hypertension: Secondary | ICD-10-CM | POA: Diagnosis not present

## 2013-04-11 DIAGNOSIS — F5104 Psychophysiologic insomnia: Secondary | ICD-10-CM | POA: Insufficient documentation

## 2013-04-11 DIAGNOSIS — M109 Gout, unspecified: Secondary | ICD-10-CM

## 2013-04-11 DIAGNOSIS — E119 Type 2 diabetes mellitus without complications: Secondary | ICD-10-CM

## 2013-04-11 DIAGNOSIS — K219 Gastro-esophageal reflux disease without esophagitis: Secondary | ICD-10-CM

## 2013-04-11 DIAGNOSIS — Z23 Encounter for immunization: Secondary | ICD-10-CM | POA: Diagnosis not present

## 2013-04-11 DIAGNOSIS — I509 Heart failure, unspecified: Secondary | ICD-10-CM

## 2013-04-11 DIAGNOSIS — I5042 Chronic combined systolic (congestive) and diastolic (congestive) heart failure: Secondary | ICD-10-CM

## 2013-04-11 MED ORDER — TRAZODONE 25 MG HALF TABLET: 25.0000 mg | ORAL_TABLET | Freq: Every day | ORAL | Status: DC

## 2013-04-11 NOTE — Telephone Encounter (Signed)
Called pharmacy and clarified

## 2013-04-11 NOTE — Progress Notes (Deleted)
Visit

## 2013-04-11 NOTE — Assessment & Plan Note (Signed)
Continue pantoprazole for reflux symptoms.

## 2013-04-11 NOTE — Telephone Encounter (Signed)
Pharmacist from [place] phoned & stated ordered 25 mg trazodone does not come in any lower dosage than 50 mg tabs.  Order is for 1/2 tab of 25 mg.  Please advise.  Pharmacy CB# [phone]

## 2013-04-11 NOTE — Patient Instructions (Addendum)
It has been a pleasure providing medical care for you.  1) Diabetes - Your last A1C was 6.6 (great!) in November. We'll re-check in May.   2) Routine Health Maintenance - Given the Prevnar vaccine today - Ear irrigation today  3) Blood pressure - Controlled on your current medications  4) Sleep medicine - We will start Trazodone 25mg . It comes in a 50mg  tablet, so cut it in half.   5) Transfer of Care - When you visit in May, see Dr. Jenny Reichmann.

## 2013-04-11 NOTE — Assessment & Plan Note (Addendum)
Lab Results  Component Value Date   HGBA1C 6.6* 01/10/2013    Good control on current regimen. Will re-check in May-June.

## 2013-04-11 NOTE — Assessment & Plan Note (Signed)
BP: 154/90 mmHg   Adequate control on present medications. No changes at this time.

## 2013-04-11 NOTE — Progress Notes (Signed)
Subjective:     Patient ID: Eric Rivers, male   DOB: March 26, 1930, 80 y.o.   MRN: [id number]  HPI  Patient presents for a three month follow-up. Since his visit in November, he has been in reasonable health. He fell on January 7th on a shore excursion in the [place] when he slipped backwards on a catamaran. No concern for falls, was not dizzy beforehand, drinks EtOH but has not fallen from intoxication. Uncomfortable, but hematoma has resolved.  Bronchitis on the 11th of January, rx for 1 week of Levaquin.   Bladder biopsy was negative for dysplasia and malignancy on 2/2  Says that he's doing well now (physically, emotionally, and mentally).   Diet - has been well controlled. Able to resist some of the temptations on cruises. Exercise - long walks on the cruises. He has been careful with exercise in the last couple of years as he has gotten older.    Past Medical History  Diagnosis Date  . Whooping cough   . Atrial fibrillation      Chronic,   24 hour holter, September, 2011.... atrial fib rate is controlled.... there is some bradycardia but  no marked pauses  . Diabetes mellitus     type 2  . Hypertension   . DDD (degenerative disc disease)     lumbar spine  . GERD (gastroesophageal reflux disease)   . Gout   . Fluid overload   . ACE-inhibitor cough   . Shortness of breath     on exertion  . Aortic stenosis     mild....echo... september... 2010/ mild... echo.[name] KitchenMarland KitchenDec, 2011  . Pulmonary hypertension     14mmHg, echo, 01/2010  . Hyperlipidemia   . Hx of colonoscopy     approx. 10 years  . Warfarin anticoagulation     Atrial fib  . Ejection fraction     EF 60%, echo, 01/2010  . Normal nuclear stress test     06/2005 , also ABI normal 2007  . Hypercholesterolemia   . Fracture of metacarpal bone 11/30/11  . Aortic dissection     Surgical repair February, 2014  . Mitral regurgitation   . Bladder cancer     recurrence with TUR-B March '09  . Prostate cancer      radiation tx.   Past Surgical History  Procedure Laterality Date  . Lumbar laminectomy      '02  . Tur-bt '06, '09    . Bladder cancer biopsy  10/16/2010    negative for malignancy  . Bladder microscopic  04/13/2007    high grade papillary urothelial lesions  . 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 Prostate biopsy  09/25/2011    GLEASON 3+3=6 AND 4+3=7  . Ascending aortic root replacement N/A 04/15/2012    Procedure: ASCENDING AORTIC ROOT REPLACEMENT;  Surgeon: Gaye Pollack, MD;  Location: [facility];  Service: Open Heart Surgery;  Laterality: N/A;  . Tee without cardioversion N/A 04/15/2012    Procedure: TRANSESOPHAGEAL ECHOCARDIOGRAM (TEE);  Surgeon: Gaye Pollack, MD;  Location: [facility];  Service: Open Heart Surgery;  Laterality: N/A;  . Exploration post operative open heart  04/2012  . Cystoscopy/retrograde/ureteroscopy Bilateral 04/04/2013    Procedure: CYSTOSCOPY WITH RETROGRADE PYELOGRAM AND BLADDER BIOPSY;  Surgeon: Molli Hazard, MD;  Location: [facility];  Service: Urology;  Laterality: Bilateral;   Family History  Problem Relation Age of Onset  . Prostate cancer Father 72  passed with prostate ca   History   Social History  . Marital Status: Single    Spouse Name: N/A    Number of Children: 0  . Years of Education: 8   Occupational History  . history and Advice worker     retired   Social History Main Topics  . Smoking status: Former Smoker -- 1.00 packs/day    Types: Cigarettes    Quit date: 03/04/1975  . Smokeless tobacco: Never Used  . Alcohol Use: 7.0 oz/week    14 drink(s) per week     Comment: liquor daily  . Drug Use: No  . Sexual Activity: Not Currently   Other Topics Concern  . Not on file   Social History Narrative   Confirmed batchelor. Retired Education officer, museum. World traveler- Ambulance person. I- ADLS. End of life Care   No CPR, no prolonged intubation, no prolonged artificial hydration or feeding, no heroic or  futile measures.       [place] Sept [place]13 [place].      Last cruise - two cruises back to back to the [place] in January '15. [place]'15: [place] with stops in [place], [place], [place], [place], [place]. 15 days      Fall '15: 50 Days from [place]. [place], [place], [place], [place], [place], [place], [place], [place], [place].            Review of Systems Constitutional:  Negative for fever, chills, activity change and unexpected weight change.  HEENT:  Negative for hearing loss, ear pain, congestion, neck stiffness and postnasal drip. Negative for sore throat or swallowing problems. Negative for dental complaints.   Eyes: Negative for vision loss or change in visual acuity.  Respiratory: Negative for chest tightness and wheezing. Negative for DOE.   Cardiovascular: Negative for chest pain or palpitations. No decreased exercise tolerance Gastrointestinal: No change in bowel habit. No bloating or gas. Positive for Reflux Genitourinary: Positive for urgency, frequency, negative for flank pain and difficulty urinating.  Musculoskeletal: Negative for myalgias, back pain, arthralgias. Slow gait. Some numbness in Right leg.  Neurological: Negative for dizziness, tremors, weakness and headaches. No problems with memory.  Hematological: Negative for adenopathy.  Psychiatric/Behavioral: Negative for behavioral problems and dysphoric mood.    Objective:   Physical Exam General: Well developed, well nourished, NAD, appears stated age HEENT: NCAT, PERRLA, EOMI, Anicteic Sclera, mucous membranes moist.  Neck: Supple, no JVD, no masses  Cardiovascular: S1 S2 auscultated, no rubs, murmurs or gallops. Regular rate and rhythm.  Respiratory: Clear to auscultation bilaterally with equal chest rise  Abdomen: Soft, nontender, nondistended, + bowel sounds  Extremities: warm, dry without cyanosis, clubbing, or  edema MSK: Trouble with tandem gait. Reduced sensation to light touch over R foot to ankle.  Neuro: Alert and Oriented x3, cranial nerves II-XII grossly intact.  Skin: Without rashes, exudates, or nodules  Psych: Normal mood and affect with intact judgement and insight  Procedure Note :   Procedure : Ear irrigation  Indication: Cerumen impaction  Risks, including pain, dizziness, eardrum perforation, bleeding, infection and others as well as benefits were explained to the patient in detail. Verbal consent was obtained and the patient agreed to proceed.   We used ear syringe filled with lukewarm water for irrigation. Wax was recovered from both ears. Tolerated well. Complications: None.  Postprocedure instructions : None.   Results from Bladder Biopsy 04/04/13 reviewed: 1. Bladder,  biopsy, floor - REACTIVE UROTHELIUM WITH UNDERLYING STROMAL HEMORRHAGE/EDEMA. - NO DYSPLASIA OR MALIGNANCY. - MUSCULARIS PROPRIA NOT PRESENT FOR EVALUATION. 2. Bladder, biopsy, left posterior - REACTIVE UROTHELIUM. - NO EVIDENCE OF DYSPLASIA OR MALIGNANCY. - MUSCULARIS PROPRIA NOT PRESENT FOR EVALUATION.    Assessment:     Per problem list     Plan:     Per problem list

## 2013-04-11 NOTE — Assessment & Plan Note (Signed)
Bladder biopsy Feb '15 negative for dysplasia or malignancy

## 2013-04-11 NOTE — Progress Notes (Signed)
Pre visit review using our clinic review tool, if applicable. No additional management support is needed unless otherwise documented below in the visit note. 

## 2013-04-11 NOTE — Assessment & Plan Note (Addendum)
Patient presents for a follow up visit today. He is in good health. Sustained a fall on his last cruise in January '15 and then had an episode of bronchitis treated with Levoquin by ship's physician. He is now in ship shape and preparing for his next cruise in may: normal physical exam. Last colonoscopy '04 - at age 80 can opt out of further screening. Current with Urology for f/u prostate and bladder cancer. Immunizations are current with Prevnar given today. He does have recurrent cerumen impaction and had simple and successful irrigation today.  In summary A delightful man who is medically stable. He will return in 3 months or sooner as needed.

## 2013-04-11 NOTE — Assessment & Plan Note (Signed)
Formulary only covering 90 pills of ambien / year.  Added Trazodone

## 2013-04-12 ENCOUNTER — Encounter: Payer: Self-pay | Admitting: Internal Medicine

## 2013-04-12 ENCOUNTER — Ambulatory Visit (INDEPENDENT_AMBULATORY_CARE_PROVIDER_SITE_OTHER): Payer: Medicare Other | Admitting: General Practice

## 2013-04-12 DIAGNOSIS — I4891 Unspecified atrial fibrillation: Secondary | ICD-10-CM

## 2013-04-12 DIAGNOSIS — I272 Pulmonary hypertension, unspecified: Secondary | ICD-10-CM

## 2013-04-12 DIAGNOSIS — Z5181 Encounter for therapeutic drug level monitoring: Secondary | ICD-10-CM

## 2013-04-12 DIAGNOSIS — Z7901 Long term (current) use of anticoagulants: Secondary | ICD-10-CM

## 2013-04-12 DIAGNOSIS — I2789 Other specified pulmonary heart diseases: Secondary | ICD-10-CM | POA: Diagnosis not present

## 2013-04-12 DIAGNOSIS — I482 Chronic atrial fibrillation, unspecified: Secondary | ICD-10-CM

## 2013-04-12 DIAGNOSIS — Z7189 Other specified counseling: Secondary | ICD-10-CM | POA: Insufficient documentation

## 2013-04-12 LAB — POCT INR: INR: 1.9

## 2013-04-12 NOTE — Assessment & Plan Note (Signed)
No flares reported. He has been stable for several years on allopurinol.

## 2013-04-12 NOTE — Assessment & Plan Note (Signed)
Stable at today's visit w/o signs of decompensation: no SOB, normal cardiac exam.   Plan Continue present medications

## 2013-04-12 NOTE — Progress Notes (Signed)
Pre-visit discussion using our clinic review tool. No additional management support is needed unless otherwise documented below in the visit note.  

## 2013-04-12 NOTE — Assessment & Plan Note (Signed)
Last lipid panel July '14 -  LDL 45, HDL 45.  Plan Continue present medications  Lipid panel July '15

## 2013-04-18 DIAGNOSIS — R339 Retention of urine, unspecified: Secondary | ICD-10-CM | POA: Diagnosis not present

## 2013-04-18 DIAGNOSIS — R3915 Urgency of urination: Secondary | ICD-10-CM | POA: Diagnosis not present

## 2013-04-18 DIAGNOSIS — Z8546 Personal history of malignant neoplasm of prostate: Secondary | ICD-10-CM | POA: Diagnosis not present

## 2013-05-09 ENCOUNTER — Other Ambulatory Visit: Payer: Self-pay

## 2013-05-09 MED ORDER — METOPROLOL TARTRATE 25 MG PO TABS: 25.0000 mg | ORAL_TABLET | Freq: Two times a day (BID) | ORAL | Status: DC

## 2013-05-10 ENCOUNTER — Other Ambulatory Visit: Payer: Self-pay

## 2013-05-10 DIAGNOSIS — I739 Peripheral vascular disease, unspecified: Secondary | ICD-10-CM | POA: Diagnosis not present

## 2013-05-10 DIAGNOSIS — E1159 Type 2 diabetes mellitus with other circulatory complications: Secondary | ICD-10-CM | POA: Diagnosis not present

## 2013-05-10 DIAGNOSIS — L608 Other nail disorders: Secondary | ICD-10-CM | POA: Diagnosis not present

## 2013-05-10 DIAGNOSIS — M2040 Other hammer toe(s) (acquired), unspecified foot: Secondary | ICD-10-CM | POA: Diagnosis not present

## 2013-05-10 MED ORDER — METOPROLOL TARTRATE 25 MG PO TABS: 25.0000 mg | ORAL_TABLET | Freq: Two times a day (BID) | ORAL | Status: DC

## 2013-05-11 ENCOUNTER — Ambulatory Visit (INDEPENDENT_AMBULATORY_CARE_PROVIDER_SITE_OTHER): Payer: Medicare Other | Admitting: General Practice

## 2013-05-11 DIAGNOSIS — I272 Pulmonary hypertension, unspecified: Secondary | ICD-10-CM

## 2013-05-11 DIAGNOSIS — I2789 Other specified pulmonary heart diseases: Secondary | ICD-10-CM

## 2013-05-11 DIAGNOSIS — Z7901 Long term (current) use of anticoagulants: Secondary | ICD-10-CM

## 2013-05-11 LAB — POCT INR: INR: 2.3

## 2013-05-11 NOTE — Progress Notes (Signed)
Pre visit review using our clinic review tool, if applicable. No additional management support is needed unless otherwise documented below in the visit note. 

## 2013-05-16 DIAGNOSIS — R3915 Urgency of urination: Secondary | ICD-10-CM | POA: Diagnosis not present

## 2013-06-06 ENCOUNTER — Encounter: Payer: Self-pay | Admitting: Internal Medicine

## 2013-06-06 ENCOUNTER — Ambulatory Visit (INDEPENDENT_AMBULATORY_CARE_PROVIDER_SITE_OTHER): Payer: Medicare Other | Admitting: Internal Medicine

## 2013-06-06 VITALS — BP 138/90 | HR 59 | Temp 97.5°F | Resp 15 | Wt 232.4 lb

## 2013-06-06 DIAGNOSIS — K219 Gastro-esophageal reflux disease without esophagitis: Secondary | ICD-10-CM | POA: Diagnosis not present

## 2013-06-06 DIAGNOSIS — I872 Venous insufficiency (chronic) (peripheral): Secondary | ICD-10-CM | POA: Diagnosis not present

## 2013-06-06 DIAGNOSIS — R059 Cough, unspecified: Secondary | ICD-10-CM | POA: Diagnosis not present

## 2013-06-06 DIAGNOSIS — R05 Cough: Secondary | ICD-10-CM

## 2013-06-06 DIAGNOSIS — H1131 Conjunctival hemorrhage, right eye: Secondary | ICD-10-CM

## 2013-06-06 DIAGNOSIS — H1130 Conjunctival hemorrhage, unspecified eye: Secondary | ICD-10-CM

## 2013-06-06 DIAGNOSIS — R040 Epistaxis: Secondary | ICD-10-CM | POA: Diagnosis not present

## 2013-06-06 NOTE — Progress Notes (Signed)
   Subjective:    Patient ID: Eric Rivers, male    DOB: 1930/09/17, 79 y.o.   MRN: [id number]  HPI 10 days ago other people noticed redness in right eye. No pain, itching, or drainage associated with the redness. Does not know of any injury to eye. Made appointment to come in after eye remained red for 5 more days. Redness has since improved and is only noticeable in inner corner. States he noticed large, free form "black spot" (floater) in left eye last night that moves around.  Also, began having non-productive cough around December that seems to occur when eating. Also has episodes during night. Sometimes coughs to the point of gagging. Noticed that he has dyspnea when lying flat at night. Sleeps with 2 pillows.    Review of Systems  Constitutional: Negative for fever, chills, diaphoresis, fatigue and unexpected weight change.  HENT: Positive for nosebleeds (Had some nosebleeds during winter which he attributes to dry air.). Negative for congestion, postnasal drip, sneezing and sore throat.   Eyes: Positive for redness. Negative for photophobia, pain, discharge, itching and visual disturbance.  Gastrointestinal: Negative for abdominal pain, diarrhea, constipation, blood in stool and abdominal distention.   Had eye exam in August 2014 which was normal.  Denies significant heartburn.    No uncontrolled HTN per patient.  He denies hemoptysis, hematuria, melena, or rectal bleeding.He has no unexplained weight loss, dysphagia, or abdominal pain. He has no abnormal bruising or bleeding. He has no difficulty stopping bleeding with injury. Objective:   Physical Exam  Constitutional: He is oriented to person, place, and time. No distress.  Obese.  HENT:  Head: Normocephalic and atraumatic.  Nose: Nose normal.  Mouth/Throat: No oropharyngeal exudate.  Eyes: Pupils are equal, round, and reactive to light. Right eye exhibits no discharge. Left eye exhibits no discharge. No scleral icterus.    Small red, spot noted in right eye near inner corner but overall no significant redness noted.   Neck: Normal range of motion. Neck supple. No tracheal deviation present.  Cardiovascular: Normal rate, regular rhythm, normal heart sounds and intact distal pulses.  Exam reveals no gallop and no friction rub.   No murmur heard. Pulmonary/Chest: Effort normal and breath sounds normal. No respiratory distress. He has no wheezes. He has no rales. He exhibits no tenderness.  Abdominal: Soft. Bowel sounds are normal.  Musculoskeletal:  Ankle edema noted.  Lymphadenopathy:    He has no cervical adenopathy.  Neurological: He is alert and oriented to person, place, and time.  Skin: Skin is warm and dry. He is not diaphoretic.  Psychiatric: He has a normal mood and affect.          Assessment & Plan:

## 2013-06-06 NOTE — Patient Instructions (Signed)
Reflux of gastric acid may be asymptomatic as this may occur mainly during sleep.The triggers for reflux  include stress; the "aspirin family" ; alcohol; peppermint; and caffeine (coffee, tea, cola, and chocolate). The aspirin family would include aspirin and the nonsteroidal agents such as ibuprofen &  Naproxen. Tylenol would not cause reflux. If having symptoms ; food & drink should be avoided for @ least 2 hours before going to bed. Until the cough resolves; take the protein pump inhibitor 30 minutes before breakfast and 30 minutes before the evening meal. Once the cough has been resolved for at least 72 hours; go back to once a day  30 minutes before breakfast. Minimal Blood Pressure Goal= AVERAGE < 140/90;  Ideal is an AVERAGE < 135/85. This AVERAGE should be calculated from @ least 5-7 BP readings taken @ different times of day on different days of week. You should not respond to isolated BP readings , but rather the AVERAGE for that week .Please bring your  blood pressure cuff to office visits to verify that it is reliable.It  can also be checked against the blood pressure device at the pharmacy. Finger or wrist cuffs are not dependable; an arm cuff is. Use Eucerin   twice a day  for the dry nasal septum. The drying & inflammation cause nose bleeds.

## 2013-06-06 NOTE — Progress Notes (Signed)
   Subjective:    Patient ID: Eric Rivers, male    DOB: 1930/10/22, 80 y.o.   MRN: [id number]  HPI  10 days ago other people noticed redness in right eye. No pain, itching, or drainage associated with the redness. Does not know of any injury to eye. Made appointment to come in after eye remained red for 5 more days. Redness has since improved and is only noticeable in inner corner. States he noticed large, free form "black spot" (floater) in left eye last night that moves around.  Also, began having non-productive cough around December that seems to occur when eating. Also has episodes during night. Sometimes coughs to the point of gagging. Noticed that he has dyspnea when lying flat at night. Sleeps with 2 pillows.      Review of Systems    Constitutional: Negative for fever, chills, diaphoresis, fatigue and unexpected weight change.  HENT: Positive for nosebleeds (Had some nosebleeds during winter which he attributes to dry air.). Negative for congestion, postnasal drip, sneezing and sore throat.  Eyes: Positive for redness. Negative for photophobia, pain, discharge, itching and visual disturbance.  Gastrointestinal: Negative for abdominal pain, diarrhea, constipation, blood in stool and abdominal distention.   Had eye exam in August 2014 which was normal.  Denies significant heartburn.  No uncontrolled HTN per patient.  He denies hemoptysis, hematuria, melena, or rectal bleeding.He has no unexplained weight loss, dysphagia, or abdominal pain. He has no abnormal bruising or bleeding. He   Objective:   Physical Exam Constitutional: He is oriented to person, place, and time. No distress.  Obese.  Head: Normocephalic and atraumatic.  Nose: Nares dry w/o exudate.  Mouth/Throat: No oropharyngeal exudate.  Eyes: Pupils are equal, round, and reactive to light. Right eye exhibits no discharge. Left eye exhibits no discharge. No scleral icterus.  Small red, spot (punctate  hemorrhage)noted in right eye near inner corner but overall no significant redness noted.  Neck: Normal range of motion. Neck supple. No tracheal deviation present.  Cardiovascular: Normal rate, regular rhythm, normal heart sounds and intact distal pulses. Exam reveals no gallop and no friction rub.  No murmur heard.  Pulmonary/Chest: Effort normal and breath sounds normal. No respiratory distress. He has no wheezes. He has no rales. He exhibits no tenderness.  Abdominal: Soft. Bowel sounds are normal.  Musculoskeletal:  Trace ankle edema noted.  Lymphadenopathy:  He has no cervical adenopathy.  Neurological: He is alert and oriented to person, place, and time.  Skin: Skin is warm and dry. He is not diaphoretic.  Psychiatric: He has a normal mood and affect.           Assessment & Plan:  #1 scleral hemorrhage, essentially resolved  #2 cough, probable reflux related  #3 epistaxis from heated,dry air  Plan: see orders recommendations

## 2013-06-06 NOTE — Progress Notes (Signed)
Pre visit review using our clinic review tool, if applicable. No additional management support is needed unless otherwise documented below in the visit note. 

## 2013-06-08 ENCOUNTER — Ambulatory Visit (INDEPENDENT_AMBULATORY_CARE_PROVIDER_SITE_OTHER): Payer: Medicare Other | Admitting: General Practice

## 2013-06-08 DIAGNOSIS — I4891 Unspecified atrial fibrillation: Secondary | ICD-10-CM

## 2013-06-08 DIAGNOSIS — I482 Chronic atrial fibrillation, unspecified: Secondary | ICD-10-CM

## 2013-06-08 DIAGNOSIS — Z5181 Encounter for therapeutic drug level monitoring: Secondary | ICD-10-CM | POA: Diagnosis not present

## 2013-06-08 DIAGNOSIS — I2789 Other specified pulmonary heart diseases: Secondary | ICD-10-CM | POA: Diagnosis not present

## 2013-06-08 DIAGNOSIS — I272 Pulmonary hypertension, unspecified: Secondary | ICD-10-CM

## 2013-06-08 LAB — POCT INR: INR: 2.6

## 2013-06-08 NOTE — Progress Notes (Signed)
Pre visit review using our clinic review tool, if applicable. No additional management support is needed unless otherwise documented below in the visit note. 

## 2013-06-27 ENCOUNTER — Telehealth: Payer: Self-pay | Admitting: Cardiology

## 2013-06-27 ENCOUNTER — Encounter: Payer: Self-pay | Admitting: Cardiology

## 2013-06-27 ENCOUNTER — Ambulatory Visit (INDEPENDENT_AMBULATORY_CARE_PROVIDER_SITE_OTHER): Payer: Medicare Other | Admitting: Cardiology

## 2013-06-27 VITALS — BP 190/92 | HR 56 | Ht 68.0 in | Wt 231.2 lb

## 2013-06-27 DIAGNOSIS — I4891 Unspecified atrial fibrillation: Secondary | ICD-10-CM | POA: Diagnosis not present

## 2013-06-27 DIAGNOSIS — I5042 Chronic combined systolic (congestive) and diastolic (congestive) heart failure: Secondary | ICD-10-CM

## 2013-06-27 DIAGNOSIS — I7101 Dissection of ascending aorta: Secondary | ICD-10-CM

## 2013-06-27 DIAGNOSIS — I482 Chronic atrial fibrillation, unspecified: Secondary | ICD-10-CM

## 2013-06-27 DIAGNOSIS — Z7901 Long term (current) use of anticoagulants: Secondary | ICD-10-CM | POA: Diagnosis not present

## 2013-06-27 DIAGNOSIS — I359 Nonrheumatic aortic valve disorder, unspecified: Secondary | ICD-10-CM

## 2013-06-27 DIAGNOSIS — K219 Gastro-esophageal reflux disease without esophagitis: Secondary | ICD-10-CM

## 2013-06-27 DIAGNOSIS — R05 Cough: Secondary | ICD-10-CM

## 2013-06-27 DIAGNOSIS — I509 Heart failure, unspecified: Secondary | ICD-10-CM | POA: Diagnosis not present

## 2013-06-27 DIAGNOSIS — G4700 Insomnia, unspecified: Secondary | ICD-10-CM

## 2013-06-27 DIAGNOSIS — I10 Essential (primary) hypertension: Secondary | ICD-10-CM | POA: Diagnosis not present

## 2013-06-27 DIAGNOSIS — I350 Nonrheumatic aortic (valve) stenosis: Secondary | ICD-10-CM

## 2013-06-27 DIAGNOSIS — I71019 Dissection of thoracic aorta, unspecified: Secondary | ICD-10-CM

## 2013-06-27 DIAGNOSIS — R059 Cough, unspecified: Secondary | ICD-10-CM | POA: Insufficient documentation

## 2013-06-27 LAB — BASIC METABOLIC PANEL
BUN: 19 mg/dL (ref 6–23)
CALCIUM: 9.1 mg/dL (ref 8.4–10.5)
CO2: 30 mEq/L (ref 19–32)
Chloride: 103 mEq/L (ref 96–112)
Creatinine, Ser: 0.9 mg/dL (ref 0.4–1.5)
GFR: 88.35 mL/min (ref >60.00)
GLUCOSE: 133 mg/dL — AB (ref 70–99)
Potassium: 4.2 mEq/L (ref 3.5–5.1)
SODIUM: 141 meq/L (ref 135–145)

## 2013-06-27 MED ORDER — FUROSEMIDE 40 MG PO TABS: 40.0000 mg | ORAL_TABLET | Freq: Every morning | ORAL | Status: DC

## 2013-06-27 MED ORDER — AMLODIPINE BESYLATE 2.5 MG PO TABS: 2.5000 mg | ORAL_TABLET | Freq: Every day | ORAL | Status: DC

## 2013-06-27 NOTE — Assessment & Plan Note (Signed)
The patient has mild aortic stenosis. No change in therapy.

## 2013-06-27 NOTE — Progress Notes (Signed)
HPI  Patient is here today to followup atrial fibrillation and diastolic CHF. He's actually doing well. He recovered from tight A. aortic dissection requiring surgery in February, 2014. When I saw him last his blood pressure was controlled. At his last primary care office visit his pressure was slightly elevated. The patient was encouraged to check his pressure at home. So far he has not yet decided to do this.  The patient wanted to talk to me about insomnia. He had been stable with Ambien but his insurance company will not support this. He is on some trazodone that was given to him before. He is taking an over-the-counter medication that is helping. He did not bring this medicine but will bring it to his neck primary care visit.  He also wanted to talk to me about his cough. This is chronic. It occurs mostly after he has eaten.  He has been told in the past and it may be related to his reflux. This probably is the case. He is not on an ACE inhibitor.  No Known Allergies  Current Outpatient Prescriptions  Medication Sig Dispense Refill  . allopurinol (ZYLOPRIM) 300 MG tablet Take 300 mg by mouth daily.      [name] Kitchen aspirin 325 MG tablet Take 325 mg by mouth daily.      [name] Kitchen atorvastatin (LIPITOR) 20 MG tablet Take 20 mg by mouth every morning.      . Calcium Carbonate-Vitamin D (CALCIUM + D PO) Take 1 tablet by mouth daily.      . Doxylamine Succinate, Sleep, (SLEEP AID PO) Take 1 tablet by mouth as needed.      . finasteride (PROSCAR) 5 MG tablet Take 5 mg by mouth every morning.       . furosemide (LASIX) 20 MG tablet Take 20 mg by mouth every morning.      . metoprolol tartrate (LOPRESSOR) 25 MG tablet Take 1 tablet (25 mg total) by mouth 2 (two) times daily.  180 tablet  3  . Multiple Vitamins-Minerals (ONCOVITE PO) Take 1 tablet by mouth daily.       . pantoprazole (PROTONIX) 40 MG tablet Take 40 mg by mouth daily.      . potassium chloride (K-DUR,KLOR-CON) 10 MEQ tablet Take 10 mEq by mouth  daily.      [name] Kitchen senna (SENOKOT) 8.6 MG tablet Take 1 tablet by mouth as needed.       . sitaGLIPtin (JANUVIA) 100 MG tablet Take 100 mg by mouth daily.      . Tamsulosin HCl (FLOMAX) 0.4 MG CAPS Take 0.4 mg by mouth at bedtime.       . traZODone (DESYREL) 25 mg TABS tablet Take 0.5 tablets (25 mg total) by mouth at bedtime.  90 tablet  3  . warfarin (COUMADIN) 4 MG tablet Take 4 mg by mouth every morning.       No current facility-administered medications for this visit.    History   Social History  . Marital Status: Single    Spouse Name: N/A    Number of Children: 0  . Years of Education: 20   Occupational History  . history and Advice worker     retired   Social History Main Topics  . Smoking status: Former Smoker -- 1.00 packs/day    Types: Cigarettes    Quit date: 03/04/1975  . Smokeless tobacco: Never Used  . Alcohol Use: 7.0 oz/week    14 drink(s) per week  Comment: liquor daily  . Drug Use: No  . Sexual Activity: Not Currently   Other Topics Concern  . Not on file   Social History Narrative   Confirmed batchelor. Retired Education officer, museum. World traveler- Ambulance person. I- ADLS. End of life Care   No CPR, no prolonged intubation, no prolonged artificial hydration or feeding, no heroic or futile measures.       Next [place] Sept [place]13 [place]      Last cruise - two cruises back to back to the [place] in January '15. May'15: From [place] with stops in [place], [place], [place], [place], [place]. 15 days      Fall '15: 50 Days from [place]. [place], [place], [place], [place], [place], [place]       May '15 - 15 days up the [employer] as far as [place]             Family History  Problem Relation Age of Onset  . Prostate cancer Father 46    passed with prostate ca    Past Medical History  Diagnosis Date  . Whooping cough   . Atrial fibrillation       Chronic,   24 hour holter, September, 2011.... atrial fib rate is controlled.... there is some bradycardia but  no marked pauses  . Diabetes mellitus     type 2  . Hypertension   . DDD (degenerative disc disease)     lumbar spine  . GERD (gastroesophageal reflux disease)   . Gout   . Fluid overload   . ACE-inhibitor cough   . Shortness of breath     on exertion  . Aortic stenosis     mild....echo... september... 2010/ mild... echo.[name] KitchenMarland KitchenDec, 2011  . Pulmonary hypertension     26mmHg, echo, 01/2010  . Hyperlipidemia   . Hx of colonoscopy     approx. 10 years  . Warfarin anticoagulation     Atrial fib  . Ejection fraction     EF 60%, echo, 01/2010  . Normal nuclear stress test     06/2005 , also ABI normal 2007  . Hypercholesterolemia   . Fracture of metacarpal bone 11/30/11  . Aortic dissection     Surgical repair February, 2014  . Mitral regurgitation   . Bladder cancer     recurrence with TUR-B March '09  . Prostate cancer     radiation tx.    Past Surgical History  Procedure Laterality Date  . Lumbar laminectomy      '02  . Tur-bt '06, '09    . Bladder cancer biopsy  10/16/2010    negative for malignancy  . Bladder microscopic  04/13/2007    high grade papillary urothelial lesions  . 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 Prostate biopsy  09/25/2011    GLEASON 3+3=6 AND 4+3=7  . Ascending aortic root replacement N/A 04/15/2012    Procedure: ASCENDING AORTIC ROOT REPLACEMENT;  Surgeon: Gaye Pollack, MD;  Location: [facility];  Service: Open Heart Surgery;  Laterality: N/A;  . Tee without cardioversion N/A 04/15/2012    Procedure: TRANSESOPHAGEAL ECHOCARDIOGRAM (TEE);  Surgeon: Gaye Pollack, MD;  Location: [facility];  Service: Open Heart Surgery;  Laterality: N/A;  . Exploration post operative open heart  04/2012  . Cystoscopy/retrograde/ureteroscopy Bilateral 04/04/2013    Procedure: CYSTOSCOPY WITH RETROGRADE PYELOGRAM AND  BLADDER BIOPSY;   Surgeon: Molli Hazard, MD;  Location: [facility];  Service: Urology;  Laterality: Bilateral;    Patient Active Problem List   Diagnosis Date Noted  . Warfarin anticoagulation     Priority: High  . Advanced care planning/counseling discussion 04/12/2013  . Encounter for therapeutic drug monitoring 04/12/2013  . Insomnia 04/11/2013  . Chronic combined systolic and diastolic CHF (congestive heart failure) 09/29/2012  . Mitral regurgitation   . Ascending aortic dissection 04/16/2012  . Malignant neoplasm of prostate 10/27/2011  . Hypertension   . GERD (gastroesophageal reflux disease)   . Aortic stenosis   . Pulmonary hypertension   . Hyperlipidemia   . Ejection fraction   . Normal nuclear stress test   . Routine general medical examination at a health care facility 08/22/2010  . Bladder cancer   . Gout   . Chronic atrial fibrillation   . HYPERTROPHY PROSTATE W/O UR OBST & OTH LUTS 09/07/2007  . DEGENERATIVE JOINT DISEASE, GENERALIZED 09/07/2007  . NEOP, MALIGNANT, BLADDER NEC 12/04/2006  . DIABETES MELLITUS, TYPE II 12/04/2006    ROS   Patient denies fever, chills, headache, sweats, rash, change in vision, change in hearing, chest pain, nausea vomiting, urinary symptoms. All other systems are reviewed and are negative.  PHYSICAL EXAM  The patient is oriented to person time and place. Affect is normal. In general he is stable. He is significantly overweight. Head is atraumatic. His conjunctiva are normal. There is no jugulovenous distention. Lungs are clear. Respiratory effort is not labored. Cardiac exam reveals S1 and S2. His abdomen is protuberant but soft. He has trace peripheral edema. There are no musculoskeletal deformities. There are no skin rashes.  Filed Vitals:   06/27/13 1021  BP: 190/92  Pulse: 56  Height: 5\' 8"  (1.727 m)  Weight: 231 lb 3.2 oz (104.872 kg)     ASSESSMENT & PLAN

## 2013-06-27 NOTE — Assessment & Plan Note (Signed)
I am very concerned about the patient's blood pressure today. I will be increasing his Lasix for his fluid status. I will also add 2.5 mg of amlodipine. He has had an aortic dissection in the past. We need to be sure that his blood pressure is controlled. I am in favor of his checking his pressure at home.

## 2013-06-27 NOTE — Assessment & Plan Note (Signed)
The patient had emergency surgery for his ascending aortic dissection. It is extremely important that his blood pressure be controlled. I will be adding 2.5 mg of amlodipine along with increasing his diuretics

## 2013-06-27 NOTE — Assessment & Plan Note (Signed)
He does have a history of GERD. This probably is the basis of his cough.

## 2013-06-27 NOTE — Telephone Encounter (Signed)
New message     Saw Dr Ron Parker this am.  Should he double his potassium medication also?

## 2013-06-27 NOTE — Assessment & Plan Note (Signed)
I encouraged him to bring the over-the-counter medication that he is using currently to his next primary care visit.

## 2013-06-27 NOTE — Telephone Encounter (Signed)
**Note De-Identified Josphine Laffey Obfuscation** The pt is advised that Dr Ron Parker did not increase his Potassium dose, he verbalized understanding.

## 2013-06-27 NOTE — Patient Instructions (Signed)
**Note De-Identified Ladelle Teodoro Obfuscation** Your physician has recommended you make the following change in your medication: start taking Amlodipine 2.5 mg daily and increase Furosemide to 40 mg daily  Your physician recommends that you return for lab work in: today  Your physician recommends that you schedule a follow-up appointment in: 2 to 3 weeks  Reminder: Please limit your salt and fluid intake

## 2013-06-27 NOTE — Assessment & Plan Note (Signed)
The patient's cough does not seem to be from either an ACE inhibitor or from CHF. It may well be from his GERD. No change in therapy from my viewpoint.

## 2013-06-27 NOTE — Assessment & Plan Note (Signed)
The patient is having some PND and orthopnea. He is also having some exertional shortness of breath. I will be increasing his Lasix from 20-40 mg daily. I had a lengthy discussion with him about his salt intake. He admits that it is excessive. I also had a long discussion with him about his total fluid intake. I encouraged him to cut back total fluids somewhat. He seemed hesitant to have an increase in his Lasix, being concerned that he would be inconvenienced by the need to use the bathroom. I explained to him that this is necessary to help with his shortness of breath and blood pressure.  As part of today's evaluation I spent greater than 25 minutes with is total care. More than half of this amount of time was spent with direct contact with him discussing all of the issues I have outlined.

## 2013-06-27 NOTE — Assessment & Plan Note (Signed)
Coumadin is continued for his anticoagulation for atrial fibrillation.

## 2013-06-27 NOTE — Assessment & Plan Note (Signed)
Atrial fibrillation rate is controlled. He is anticoagulated. No change in therapy. 

## 2013-07-18 ENCOUNTER — Other Ambulatory Visit: Payer: Medicare Other

## 2013-07-18 DIAGNOSIS — E119 Type 2 diabetes mellitus without complications: Secondary | ICD-10-CM

## 2013-07-18 DIAGNOSIS — C61 Malignant neoplasm of prostate: Secondary | ICD-10-CM | POA: Diagnosis not present

## 2013-07-18 DIAGNOSIS — Z8546 Personal history of malignant neoplasm of prostate: Secondary | ICD-10-CM | POA: Diagnosis not present

## 2013-07-18 LAB — HEMOGLOBIN A1C: HEMOGLOBIN A1C: 7.2 % — AB (ref 4.6–6.5)

## 2013-07-19 ENCOUNTER — Ambulatory Visit (INDEPENDENT_AMBULATORY_CARE_PROVIDER_SITE_OTHER): Payer: Medicare Other | Admitting: Emergency Medicine

## 2013-07-19 ENCOUNTER — Encounter (HOSPITAL_COMMUNITY): Payer: Self-pay | Admitting: Emergency Medicine

## 2013-07-19 ENCOUNTER — Ambulatory Visit: Payer: Medicare Other

## 2013-07-19 ENCOUNTER — Observation Stay (HOSPITAL_COMMUNITY)
Admission: EM | Admit: 2013-07-19 | Discharge: 2013-07-21 | Disposition: A | Discharge status: Home / Self Care | Payer: Medicare Other | Attending: Emergency Medicine | Admitting: Emergency Medicine

## 2013-07-19 ENCOUNTER — Emergency Department (HOSPITAL_COMMUNITY): Payer: Medicare Other

## 2013-07-19 VITALS — BP 132/71 | HR 82 | Temp 98.1°F | Resp 18 | Ht 67.0 in | Wt 229.0 lb

## 2013-07-19 DIAGNOSIS — M109 Gout, unspecified: Secondary | ICD-10-CM | POA: Diagnosis not present

## 2013-07-19 DIAGNOSIS — E119 Type 2 diabetes mellitus without complications: Secondary | ICD-10-CM

## 2013-07-19 DIAGNOSIS — M159 Polyosteoarthritis, unspecified: Secondary | ICD-10-CM

## 2013-07-19 DIAGNOSIS — I110 Hypertensive heart disease with heart failure: Secondary | ICD-10-CM | POA: Diagnosis present

## 2013-07-19 DIAGNOSIS — S00209A Unspecified superficial injury of unspecified eyelid and periocular area, initial encounter: Principal | ICD-10-CM | POA: Insufficient documentation

## 2013-07-19 DIAGNOSIS — Z8739 Personal history of other diseases of the musculoskeletal system and connective tissue: Secondary | ICD-10-CM | POA: Insufficient documentation

## 2013-07-19 DIAGNOSIS — C679 Malignant neoplasm of bladder, unspecified: Secondary | ICD-10-CM

## 2013-07-19 DIAGNOSIS — R9430 Abnormal result of cardiovascular function study, unspecified: Secondary | ICD-10-CM

## 2013-07-19 DIAGNOSIS — I10 Essential (primary) hypertension: Secondary | ICD-10-CM

## 2013-07-19 DIAGNOSIS — I5032 Chronic diastolic (congestive) heart failure: Secondary | ICD-10-CM

## 2013-07-19 DIAGNOSIS — Z8781 Personal history of (healed) traumatic fracture: Secondary | ICD-10-CM | POA: Insufficient documentation

## 2013-07-19 DIAGNOSIS — K219 Gastro-esophageal reflux disease without esophagitis: Secondary | ICD-10-CM | POA: Diagnosis not present

## 2013-07-19 DIAGNOSIS — E7800 Pure hypercholesterolemia, unspecified: Secondary | ICD-10-CM | POA: Insufficient documentation

## 2013-07-19 DIAGNOSIS — R0789 Other chest pain: Secondary | ICD-10-CM

## 2013-07-19 DIAGNOSIS — S0003XA Contusion of scalp, initial encounter: Secondary | ICD-10-CM | POA: Diagnosis not present

## 2013-07-19 DIAGNOSIS — IMO0002 Reserved for concepts with insufficient information to code with codable children: Secondary | ICD-10-CM | POA: Diagnosis not present

## 2013-07-19 DIAGNOSIS — I272 Pulmonary hypertension, unspecified: Secondary | ICD-10-CM

## 2013-07-19 DIAGNOSIS — I5042 Chronic combined systolic (congestive) and diastolic (congestive) heart failure: Secondary | ICD-10-CM

## 2013-07-19 DIAGNOSIS — Z79899 Other long term (current) drug therapy: Secondary | ICD-10-CM | POA: Insufficient documentation

## 2013-07-19 DIAGNOSIS — I4891 Unspecified atrial fibrillation: Secondary | ICD-10-CM

## 2013-07-19 DIAGNOSIS — Y939 Activity, unspecified: Secondary | ICD-10-CM | POA: Insufficient documentation

## 2013-07-19 DIAGNOSIS — Z87891 Personal history of nicotine dependence: Secondary | ICD-10-CM | POA: Diagnosis not present

## 2013-07-19 DIAGNOSIS — R5383 Other fatigue: Secondary | ICD-10-CM | POA: Diagnosis not present

## 2013-07-19 DIAGNOSIS — E785 Hyperlipidemia, unspecified: Secondary | ICD-10-CM

## 2013-07-19 DIAGNOSIS — Z8551 Personal history of malignant neoplasm of bladder: Secondary | ICD-10-CM | POA: Insufficient documentation

## 2013-07-19 DIAGNOSIS — I340 Nonrheumatic mitral (valve) insufficiency: Secondary | ICD-10-CM

## 2013-07-19 DIAGNOSIS — R05 Cough: Secondary | ICD-10-CM | POA: Diagnosis present

## 2013-07-19 DIAGNOSIS — R5381 Other malaise: Secondary | ICD-10-CM | POA: Diagnosis not present

## 2013-07-19 DIAGNOSIS — I7101 Dissection of ascending aorta: Secondary | ICD-10-CM

## 2013-07-19 DIAGNOSIS — I350 Nonrheumatic aortic (valve) stenosis: Secondary | ICD-10-CM

## 2013-07-19 DIAGNOSIS — N400 Enlarged prostate without lower urinary tract symptoms: Secondary | ICD-10-CM

## 2013-07-19 DIAGNOSIS — Z7982 Long term (current) use of aspirin: Secondary | ICD-10-CM | POA: Insufficient documentation

## 2013-07-19 DIAGNOSIS — R55 Syncope and collapse: Secondary | ICD-10-CM

## 2013-07-19 DIAGNOSIS — Z7901 Long term (current) use of anticoagulants: Secondary | ICD-10-CM | POA: Diagnosis not present

## 2013-07-19 DIAGNOSIS — R071 Chest pain on breathing: Secondary | ICD-10-CM | POA: Diagnosis not present

## 2013-07-19 DIAGNOSIS — Z5181 Encounter for therapeutic drug level monitoring: Secondary | ICD-10-CM

## 2013-07-19 DIAGNOSIS — S298XXA Other specified injuries of thorax, initial encounter: Secondary | ICD-10-CM | POA: Insufficient documentation

## 2013-07-19 DIAGNOSIS — S0990XA Unspecified injury of head, initial encounter: Secondary | ICD-10-CM | POA: Diagnosis not present

## 2013-07-19 DIAGNOSIS — R059 Cough, unspecified: Secondary | ICD-10-CM

## 2013-07-19 DIAGNOSIS — C61 Malignant neoplasm of prostate: Secondary | ICD-10-CM

## 2013-07-19 DIAGNOSIS — T148XXA Other injury of unspecified body region, initial encounter: Secondary | ICD-10-CM

## 2013-07-19 DIAGNOSIS — I482 Chronic atrial fibrillation, unspecified: Secondary | ICD-10-CM

## 2013-07-19 DIAGNOSIS — Z0000 Encounter for general adult medical examination without abnormal findings: Secondary | ICD-10-CM

## 2013-07-19 DIAGNOSIS — Y929 Unspecified place or not applicable: Secondary | ICD-10-CM | POA: Insufficient documentation

## 2013-07-19 DIAGNOSIS — R404 Transient alteration of awareness: Secondary | ICD-10-CM | POA: Diagnosis not present

## 2013-07-19 DIAGNOSIS — S0083XA Contusion of other part of head, initial encounter: Secondary | ICD-10-CM | POA: Insufficient documentation

## 2013-07-19 DIAGNOSIS — Z7189 Other specified counseling: Secondary | ICD-10-CM

## 2013-07-19 DIAGNOSIS — Z8546 Personal history of malignant neoplasm of prostate: Secondary | ICD-10-CM | POA: Insufficient documentation

## 2013-07-19 DIAGNOSIS — S1093XA Contusion of unspecified part of neck, initial encounter: Secondary | ICD-10-CM

## 2013-07-19 DIAGNOSIS — G4700 Insomnia, unspecified: Secondary | ICD-10-CM

## 2013-07-19 DIAGNOSIS — R079 Chest pain, unspecified: Secondary | ICD-10-CM | POA: Diagnosis not present

## 2013-07-19 DIAGNOSIS — T1490XA Injury, unspecified, initial encounter: Secondary | ICD-10-CM | POA: Diagnosis not present

## 2013-07-19 DIAGNOSIS — W19XXXA Unspecified fall, initial encounter: Secondary | ICD-10-CM

## 2013-07-19 LAB — COMPREHENSIVE METABOLIC PANEL
ALBUMIN: 3.5 g/dL (ref 3.5–5.2)
ALK PHOS: 101 U/L (ref 39–117)
ALT: 18 U/L (ref 0–53)
AST: 31 U/L (ref 0–37)
BILIRUBIN TOTAL: 0.9 mg/dL (ref 0.3–1.2)
BUN: 9 mg/dL (ref 6–23)
CHLORIDE: 102 meq/L (ref 96–112)
CO2: 25 meq/L (ref 19–32)
Calcium: 8.7 mg/dL (ref 8.4–10.5)
Creatinine, Ser: 0.77 mg/dL (ref 0.50–1.35)
GFR calc Af Amer: >90 mL/min (ref >90)
GFR, EST NON AFRICAN AMERICAN: 83 mL/min — AB (ref >90)
Glucose, Bld: 120 mg/dL — ABNORMAL HIGH (ref 70–99)
POTASSIUM: 3.8 meq/L (ref 3.7–5.3)
Sodium: 141 mEq/L (ref 137–147)
Total Protein: 7 g/dL (ref 6.0–8.3)

## 2013-07-19 LAB — POCT CBC
GRANULOCYTE PERCENT: 77.3 % (ref 37–80)
HEMATOCRIT: 36.9 % — AB (ref 43.5–53.7)
Hemoglobin: 11.1 g/dL — AB (ref 14.1–18.1)
Lymph, poc: 1.2 (ref 0.6–3.4)
MCH, POC: 26.9 pg — AB (ref 27–31.2)
MCHC: 30.1 g/dL — AB (ref 31.8–35.4)
MCV: 89.3 fL (ref 80–97)
MID (cbc): 0.6 (ref 0–0.9)
MPV: 8.5 fL (ref 0–99.8)
PLATELET COUNT, POC: 265 10*3/uL (ref 142–424)
POC Granulocyte: 6.3 (ref 2–6.9)
POC LYMPH PERCENT: 15.4 %L (ref 10–50)
POC MID %: 7.3 %M (ref 0–12)
RBC: 4.13 M/uL — AB (ref 4.69–6.13)
RDW, POC: 17.8 %
WBC: 8.1 10*3/uL (ref 4.6–10.2)

## 2013-07-19 LAB — URINALYSIS, ROUTINE W REFLEX MICROSCOPIC
BILIRUBIN URINE: NEGATIVE
GLUCOSE, UA: NEGATIVE mg/dL
Hgb urine dipstick: NEGATIVE
Ketones, ur: 15 mg/dL — AB
Leukocytes, UA: NEGATIVE
Nitrite: NEGATIVE
PROTEIN: NEGATIVE mg/dL
Specific Gravity, Urine: 1.022 (ref 1.005–1.030)
UROBILINOGEN UA: 1 mg/dL (ref 0.0–1.0)
pH: 5.5 (ref 5.0–8.0)

## 2013-07-19 LAB — CBC WITH DIFFERENTIAL/PLATELET
BASOS ABS: 0 10*3/uL (ref 0.0–0.1)
BASOS PCT: 0 % (ref 0–1)
Eosinophils Absolute: 0.1 10*3/uL (ref 0.0–0.7)
Eosinophils Relative: 2 % (ref 0–5)
HEMATOCRIT: 32.9 % — AB (ref 39.0–52.0)
Hemoglobin: 10.4 g/dL — ABNORMAL LOW (ref 13.0–17.0)
Lymphocytes Relative: 12 % (ref 12–46)
Lymphs Abs: 0.9 10*3/uL (ref 0.7–4.0)
MCH: 27.9 pg (ref 26.0–34.0)
MCHC: 31.6 g/dL (ref 30.0–36.0)
MCV: 88.2 fL (ref 78.0–100.0)
Monocytes Absolute: 0.8 10*3/uL (ref 0.1–1.0)
Monocytes Relative: 11 % (ref 3–12)
NEUTROS ABS: 5.7 10*3/uL (ref 1.7–7.7)
NEUTROS PCT: 75 % (ref 43–77)
Platelets: 200 10*3/uL (ref 150–400)
RBC: 3.73 MIL/uL — ABNORMAL LOW (ref 4.22–5.81)
RDW: 17.3 % — AB (ref 11.5–15.5)
WBC: 7.6 10*3/uL (ref 4.0–10.5)

## 2013-07-19 LAB — TROPONIN I: Troponin I: <0.3 ng/mL (ref <0.30)

## 2013-07-19 LAB — PRO B NATRIURETIC PEPTIDE: Pro B Natriuretic peptide (BNP): 1583 pg/mL — ABNORMAL HIGH (ref 0–450)

## 2013-07-19 LAB — PROTIME-INR
INR: 2.9 — AB (ref <1.50)
INR: 2.96 — ABNORMAL HIGH (ref 0.00–1.49)
Prothrombin Time: 29.5 seconds — ABNORMAL HIGH (ref 11.6–15.2)
Prothrombin Time: 29.8 seconds — ABNORMAL HIGH (ref 11.6–15.2)

## 2013-07-19 LAB — ETHANOL

## 2013-07-19 LAB — CK: CK TOTAL: 143 U/L (ref 7–232)

## 2013-07-19 MED ORDER — ALLOPURINOL 300 MG PO TABS
300.0000 mg | ORAL_TABLET | Freq: Every day | ORAL | Status: DC
  Administered 2013-07-20 – 2013-07-21 (×2): 300 mg via ORAL
  Filled 2013-07-19 (×2): qty 1

## 2013-07-19 MED ORDER — ACETAMINOPHEN 500 MG PO TABS
1000.0000 mg | ORAL_TABLET | Freq: Three times a day (TID) | ORAL | Status: DC
  Administered 2013-07-20 – 2013-07-21 (×5): 1000 mg via ORAL
  Filled 2013-07-19 (×10): qty 2

## 2013-07-19 MED ORDER — HYDROCORTISONE 1 % EX CREA
TOPICAL_CREAM | Freq: Two times a day (BID) | CUTANEOUS | Status: DC
  Administered 2013-07-20 – 2013-07-21 (×4): via TOPICAL
  Filled 2013-07-19 (×2): qty 28

## 2013-07-19 MED ORDER — ASPIRIN 325 MG PO TABS
325.0000 mg | ORAL_TABLET | Freq: Every day | ORAL | Status: DC
  Administered 2013-07-20 – 2013-07-21 (×2): 325 mg via ORAL
  Filled 2013-07-19 (×2): qty 1

## 2013-07-19 MED ORDER — PANTOPRAZOLE SODIUM 40 MG PO TBEC
40.0000 mg | DELAYED_RELEASE_TABLET | Freq: Every day | ORAL | Status: DC
  Administered 2013-07-20 – 2013-07-21 (×2): 40 mg via ORAL
  Filled 2013-07-19 (×2): qty 1

## 2013-07-19 MED ORDER — SENNA 8.6 MG PO TABS
1.0000 | ORAL_TABLET | ORAL | Status: DC | PRN
  Filled 2013-07-19: qty 1

## 2013-07-19 MED ORDER — METOPROLOL TARTRATE 25 MG PO TABS
25.0000 mg | ORAL_TABLET | Freq: Two times a day (BID) | ORAL | Status: DC
  Administered 2013-07-20 (×2): 25 mg via ORAL
  Filled 2013-07-19 (×3): qty 1

## 2013-07-19 MED ORDER — ACETAMINOPHEN 325 MG PO TABS
650.0000 mg | ORAL_TABLET | Freq: Once | ORAL | Status: AC
  Administered 2013-07-19: 650 mg via ORAL
  Filled 2013-07-19: qty 2

## 2013-07-19 MED ORDER — ADULT MULTIVITAMIN W/MINERALS CH
1.0000 | ORAL_TABLET | Freq: Every day | ORAL | Status: DC
  Filled 2013-07-19: qty 1

## 2013-07-19 MED ORDER — TRAZODONE 25 MG HALF TABLET
25.0000 mg | ORAL_TABLET | Freq: Every day | ORAL | Status: DC
  Administered 2013-07-20 (×2): 25 mg via ORAL
  Filled 2013-07-19 (×3): qty 1

## 2013-07-19 MED ORDER — SODIUM CHLORIDE 0.9 % IJ SOLN: 3.0000 mL | INTRAMUSCULAR | Status: DC | PRN

## 2013-07-19 MED ORDER — KETOCONAZOLE 2 % EX SHAM
1.0000 "application " | MEDICATED_SHAMPOO | CUTANEOUS | Status: DC
  Filled 2013-07-19: qty 120

## 2013-07-19 MED ORDER — SODIUM CHLORIDE 0.9 % IJ SOLN
3.0000 mL | Freq: Two times a day (BID) | INTRAMUSCULAR | Status: DC
  Administered 2013-07-20 (×3): 3 mL via INTRAVENOUS

## 2013-07-19 MED ORDER — FINASTERIDE 5 MG PO TABS
5.0000 mg | ORAL_TABLET | Freq: Every morning | ORAL | Status: DC
  Administered 2013-07-20 – 2013-07-21 (×2): 5 mg via ORAL
  Filled 2013-07-19 (×2): qty 1

## 2013-07-19 MED ORDER — TAMSULOSIN HCL 0.4 MG PO CAPS
0.4000 mg | ORAL_CAPSULE | Freq: Every day | ORAL | Status: DC
  Administered 2013-07-20 (×2): 0.4 mg via ORAL
  Filled 2013-07-19 (×3): qty 1

## 2013-07-19 MED ORDER — INSULIN ASPART 100 UNIT/ML ~~LOC~~ SOLN: 0.0000 [IU] | Freq: Three times a day (TID) | SUBCUTANEOUS | Status: DC

## 2013-07-19 MED ORDER — AMLODIPINE BESYLATE 2.5 MG PO TABS
2.5000 mg | ORAL_TABLET | Freq: Every day | ORAL | Status: DC
  Administered 2013-07-20 – 2013-07-21 (×2): 2.5 mg via ORAL
  Filled 2013-07-19 (×2): qty 1

## 2013-07-19 MED ORDER — SODIUM CHLORIDE 0.9 % IJ SOLN: 3.0000 mL | Freq: Two times a day (BID) | INTRAMUSCULAR | Status: DC

## 2013-07-19 MED ORDER — SODIUM CHLORIDE 0.9 % IV SOLN: 250.0000 mL | INTRAVENOUS | Status: DC | PRN

## 2013-07-19 MED ORDER — ATORVASTATIN CALCIUM 20 MG PO TABS
20.0000 mg | ORAL_TABLET | Freq: Every morning | ORAL | Status: DC
  Administered 2013-07-20 – 2013-07-21 (×2): 20 mg via ORAL
  Filled 2013-07-19 (×2): qty 1

## 2013-07-19 MED ORDER — CALCIUM CARBONATE-VITAMIN D 500-200 MG-UNIT PO TABS
1.0000 | ORAL_TABLET | Freq: Every day | ORAL | Status: DC
  Administered 2013-07-20 – 2013-07-21 (×2): 1 via ORAL
  Filled 2013-07-19 (×3): qty 1

## 2013-07-19 MED ORDER — POTASSIUM CHLORIDE CRYS ER 10 MEQ PO TBCR
10.0000 meq | EXTENDED_RELEASE_TABLET | Freq: Every day | ORAL | Status: DC
  Administered 2013-07-20 – 2013-07-21 (×2): 10 meq via ORAL
  Filled 2013-07-19 (×2): qty 1

## 2013-07-19 MED ORDER — FUROSEMIDE 40 MG PO TABS
40.0000 mg | ORAL_TABLET | Freq: Every morning | ORAL | Status: DC
  Filled 2013-07-19 (×2): qty 1

## 2013-07-19 NOTE — ED Provider Notes (Signed)
CSN: [id number]     Arrival date & time 07/19/13  1630 History   First MD Initiated Contact with Patient 07/19/13 1748     Chief Complaint  Patient presents with  . Fall     (Consider location/radiation/quality/duration/timing/severity/associated sxs/prior Treatment) Patient is a 80 y.o. male presenting with fall. The history is provided by the patient.  Fall This is a new problem. The current episode started 12 to 24 hours ago. Episode frequency: once. The problem has been resolved. Associated symptoms include chest pain. Pertinent negatives include no abdominal pain, no headaches and no shortness of breath. Nothing aggravates the symptoms. Nothing relieves the symptoms. He has tried nothing for the symptoms. The treatment provided no relief.    Past Medical History  Diagnosis Date  . Whooping cough   . Atrial fibrillation      Chronic,   24 hour holter, September, 2011.... atrial fib rate is controlled.... there is some bradycardia but  no marked pauses  . Diabetes mellitus     type 2  . Hypertension   . DDD (degenerative disc disease)     lumbar spine  . GERD (gastroesophageal reflux disease)   . Gout   . Fluid overload   . ACE-inhibitor cough   . Shortness of breath     on exertion  . Aortic stenosis     mild....echo... september... 2010/ mild... echo.[name] KitchenMarland KitchenDec, 2011  . Pulmonary hypertension     70mmHg, echo, 01/2010  . Hyperlipidemia   . Hx of colonoscopy     approx. 10 years  . Warfarin anticoagulation     Atrial fib  . Ejection fraction     EF 60%, echo, 01/2010  . Normal nuclear stress test     06/2005 , also ABI normal 2007  . Hypercholesterolemia   . Fracture of metacarpal bone 11/30/11  . Aortic dissection     Surgical repair February, 2014  . Mitral regurgitation   . Bladder cancer     recurrence with TUR-B March '09  . Prostate cancer     radiation tx.   Past Surgical History  Procedure Laterality Date  . Lumbar laminectomy      '02  . Tur-bt '06, '09     . Bladder cancer biopsy  10/16/2010    negative for malignancy  . Bladder microscopic  04/13/2007    high grade papillary urothelial lesions  . 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 Prostate biopsy  09/25/2011    GLEASON 3+3=6 AND 4+3=7  . Ascending aortic root replacement N/A 04/15/2012    Procedure: ASCENDING AORTIC ROOT REPLACEMENT;  Surgeon: Gaye Pollack, MD;  Location: [facility];  Service: Open Heart Surgery;  Laterality: N/A;  . Tee without cardioversion N/A 04/15/2012    Procedure: TRANSESOPHAGEAL ECHOCARDIOGRAM (TEE);  Surgeon: Gaye Pollack, MD;  Location: [facility];  Service: Open Heart Surgery;  Laterality: N/A;  . Exploration post operative open heart  04/2012  . Cystoscopy/retrograde/ureteroscopy Bilateral 04/04/2013    Procedure: CYSTOSCOPY WITH RETROGRADE PYELOGRAM AND BLADDER BIOPSY;  Surgeon: Molli Hazard, MD;  Location: [facility];  Service: Urology;  Laterality: Bilateral;  . Herniatic disc surgery     Family History  Problem Relation Age of Onset  . Prostate cancer Father 79    passed with prostate ca  . Cancer Father   . Stroke Father   . Heart disease Mother   . Hypertension Mother    History  Substance Use Topics  . Smoking status: Former Smoker -- 1.00 packs/day    Types: Cigarettes    Quit date: 03/04/1975  . Smokeless tobacco: Never Used  . Alcohol Use: 7.0 oz/week    14 drink(s) per week     Comment: liquor daily    Review of Systems  Constitutional: Negative for fever.       Fall   HENT: Negative for drooling and rhinorrhea.   Eyes: Negative for pain.  Respiratory: Negative for cough and shortness of breath.   Cardiovascular: Positive for chest pain. Negative for leg swelling.  Gastrointestinal: Negative for nausea, vomiting, abdominal pain and diarrhea.  Genitourinary: Negative for dysuria and hematuria.  Musculoskeletal: Negative for gait problem and neck pain.  Skin: Negative for color change.   Neurological: Negative for numbness and headaches.  Hematological: Negative for adenopathy.  Psychiatric/Behavioral: Negative for behavioral problems.  All other systems reviewed and are negative.     Allergies  Review of patient's allergies indicates no known allergies.  Home Medications   Prior to Admission medications   Medication Sig Start Date End Date Taking? Authorizing Provider  acetaminophen (TYLENOL) 500 MG tablet Take 1,000 mg by mouth 3 (three) times daily.   Yes Historical Provider, MD  allopurinol (ZYLOPRIM) 300 MG tablet Take 300 mg by mouth daily.   Yes Historical Provider, MD  amLODipine (NORVASC) 2.5 MG tablet Take 1 tablet (2.5 mg total) by mouth daily. 06/27/13  Yes Carlena Bjornstad, MD  aspirin 325 MG tablet Take 325 mg by mouth daily.   Yes Historical Provider, MD  atorvastatin (LIPITOR) 20 MG tablet Take 20 mg by mouth every morning.   Yes Historical Provider, MD  Calcium Carb-Cholecalciferol (CALCIUM 600 + D PO) Take 1 tablet by mouth every morning.   Yes Historical Provider, MD  desonide (DESOWEN) 0.05 % lotion Apply 1 application topically daily as needed (for face).   Yes Historical Provider, MD  Doxylamine Succinate, Sleep, (SLEEP AID PO) Take 1 tablet by mouth as needed (for sleep).    Yes Historical Provider, MD  finasteride (PROSCAR) 5 MG tablet Take 5 mg by mouth every morning.    Yes Historical Provider, MD  furosemide (LASIX) 40 MG tablet Take 1 tablet (40 mg total) by mouth every morning. 06/27/13  Yes Carlena Bjornstad, MD  ketoconazole (NIZORAL) 2 % shampoo Apply 1 application topically 4 (four) times a week.   Yes Historical Provider, MD  metoprolol tartrate (LOPRESSOR) 25 MG tablet Take 1 tablet (25 mg total) by mouth 2 (two) times daily. 05/10/13  Yes Biagio Borg, MD  Multiple Vitamins-Minerals (ONCOVITE PO) Take 1 tablet by mouth daily.    Yes Historical Provider, MD  pantoprazole (PROTONIX) 40 MG tablet Take 40 mg by mouth daily.   Yes Historical  Provider, MD  potassium chloride (K-DUR,KLOR-CON) 10 MEQ tablet Take 10 mEq by mouth daily.   Yes Historical Provider, MD  senna (SENOKOT) 8.6 MG tablet Take 1 tablet by mouth as needed for constipation.    Yes Historical Provider, MD  sitaGLIPtin (JANUVIA) 100 MG tablet Take 100 mg by mouth daily.   Yes Historical Provider, MD  Tamsulosin HCl (FLOMAX) 0.4 MG CAPS Take 0.4 mg by mouth at bedtime.    Yes Historical Provider, MD  traZODone (DESYREL) 25 mg TABS tablet Take 0.5 tablets (25 mg total) by mouth at bedtime. 04/11/13  Yes Neena Rhymes, MD  warfarin (COUMADIN) 4 MG tablet Take 4 mg by mouth every morning.  Yes Historical Provider, MD   BP 165/79  Pulse 86  Temp(Src) 98 F (36.7 C) (Oral)  Resp 24  SpO2 95% Physical Exam  Nursing note and vitals reviewed. Constitutional: He is oriented to person, place, and time. He appears well-developed and well-nourished.  HENT:  Head: Normocephalic and atraumatic.  Right Ear: External ear normal.  Left Ear: External ear normal.  Nose: Nose normal.  Mouth/Throat: Oropharynx is clear and moist. No oropharyngeal exudate.  Mild abrasion to left supraorbital area.  Eyes: Conjunctivae and EOM are normal. Pupils are equal, round, and reactive to light.  Neck: Normal range of motion. Neck supple.  Cardiovascular: Normal rate, regular rhythm, normal heart sounds and intact distal pulses.  Exam reveals no gallop and no friction rub.   No murmur heard. Pulmonary/Chest: Effort normal and breath sounds normal. No respiratory distress. He has no wheezes. He exhibits tenderness (mild to moderate tenderness to palpation of the inferior sternum right anterior and lateral lower ribs.).  Abdominal: Soft. Bowel sounds are normal. He exhibits no distension. There is no tenderness. There is no rebound and no guarding.  Musculoskeletal: Normal range of motion. He exhibits no edema and no tenderness.  Neurological: He is alert and oriented to person, place, and  time. He displays a negative Romberg sign.  alert, oriented x3 speech: normal in context and clarity memory: intact grossly cranial nerves II-XII: intact motor strength: full proximally and distally no involuntary movements or tremors sensation: intact to light touch diffusely  cerebellar: finger-to-nose and heel-to-shin intact gait: normal forwards and backwards   Skin: Skin is warm and dry.  Psychiatric: He has a normal mood and affect. His behavior is normal.    ED Course  Procedures (including critical care time) Labs Review Labs Reviewed - No data to display  Imaging Review Dg Chest 2 View  07/19/2013   CLINICAL DATA:  Chest wall pain  EXAM: CHEST  2 VIEW  COMPARISON:  DG CHEST 2 VIEW dated 05/26/2012; DG CHEST 2 VIEW dated 05/11/2012; CT ANGIO CHEST AORTA W/CM \\T \/OR WO/CM dated 11/24/2012; DG CHEST 1V PORT dated 04/19/2012  FINDINGS: Grossly unchanged enlarged cardiac silhouette and mediastinal contours post median sternotomy. There is persistent aneurysmal dilatation of the thoracic aorta. There is persistent thickening of the right paratracheal stripe secondary to prominent vasculature. Grossly unchanged bibasilar heterogeneous opacities, left greater than right favored to represent atelectasis or scar. No new focal airspace opacities. No pleural effusion or pneumothorax. No evidence of edema. No acute osseus abnormalities, specifically, no definite displaced rib fractures. Surgical clips again overlie the right axilla.  IMPRESSION: 1.  No acute cardiopulmonary disease. 2. Grossly unchanged findings of cardiomegaly and aneurysmal dilatation of the thoracic aorta compatible with known thoracic aortic dissection as demonstrated on prior chest CT. 3. Grossly unchanged bibasilar heterogeneous opacities, left greater than right, likely atelectasis or scar.   Electronically Signed   By: Sandi Mariscal M.D.   On: 07/19/2013 16:52     EKG Interpretation   Date/Time:  Tuesday Jul 19 2013  16:42:42 EDT Ventricular Rate:  84 PR Interval:    QRS Duration: 98 QT Interval:  399 QTC Calculation: 472 R Axis:   104 Text Interpretation:  Age not entered, assumed to be  80 years old for  purpose of ECG interpretation Atrial fibrillation Right axis deviation  Borderline low voltage, extremity leads Nonspecific repol abnormality,  lateral leads Confirmed by Jinger Middlesworth  MD, Aletheia Tangredi ([id number]) on 07/19/2013  4:58:49 PM  MDM   Final diagnoses:  Fall  Abrasion    6:33 PM 80 y.o. male with a history of atrial fibrillation on Coumadin, history of aortic dissection with surgical repair, diabetes who presents with a fall which occurred yesterday evening after several pocket drinks. He is not sure if he tripped and syncopized. He woke up early this morning and was unable to get himself up off the ground. EMS arrived and attempted to bring him to the hospital but he declined. He'll back to sleep. When he awoke later on in the morning he was having some right-sided rib and sternal pain. He decided to come to the hospital for evaluation. He is afebrile and vital signs are unremarkable here. He denies any headache. He was seen at urgent care prior to arrival here. Screening chest x-ray was performed there. Will get further imaging and lab work. Tylenol for pain.  Pt states tdap UTD w/in last 10 years. Declines renewal.   Will admit to hospitalist given unknown cause of fall which could be syncope in a pt w/ multiple comorbities.   Blanchard Kelch, MD 07/20/13 2115

## 2013-07-19 NOTE — ED Notes (Signed)
Patient refused for blood to be drawn.

## 2013-07-19 NOTE — ED Notes (Signed)
Dr. Harrison at the bedside. 

## 2013-07-19 NOTE — ED Notes (Signed)
Per EMS, Patient had at least three glasses of Vodka last night. Patient states he does not remember what happen, but he woke up on the floor last night. He was not sure how he got there, but he was wedged under a chair. Patient states, "I had voided on myself, but I pulled myself out of the chair and called 911." Patient refused to come to the hospital last night with EMS. This morning, patient complains of 4/10 chest pain that gradually began to get worse over the morning. He went to [facility] to be checked out. Urgent care completed EKG and Chest Xray. No significant results. Patient is on Coumadin and show signs of hitting his head. Per EMS, patient was transferred for evaluation of his head. Patient reports no pain at this time.

## 2013-07-19 NOTE — ED Notes (Signed)
Patient helped to the side of the bed and given urinal.

## 2013-07-19 NOTE — H&P (Addendum)
Hospitalist Admission History and Physical  Patient name: Eric Rivers Medical record number: [id number] Date of birth: 1930/11/02 Age: 80 y.o. Gender: male  Primary Care Provider: Cathlean Cower, MD  Chief Complaint: syncope  History of Present Illness:This is a 80 y.o. year old male with multiple medical problems including chronic A. fib on Coumadin, aortic stenosis, pulmonary hypertension, reflux, type 2 diabetes, a sending aortic dissection presented with syncope. Patient states that he had 3 large drinks liquor last night over the course of about 3 hours. Patient states that he woke up in the morning underneath his recliner and being unable to get out. Patient states that he slowly worked his way out of a chair over the course of the morning and then proceed to call EMS. This is around 4:00 this morning. Patient was initially recommended to go to the ER at that time but patient refused. Vision then took a nap for the rest of the morning. Patient woke up later in the day with chest wall pain with pain it was worse with deep breathing coughing or movement. Patient also had a mild abrasion over the left side. Patient went to the [facility] urgent [facility] for further evaluation and was directed to go to the ER. Patient has no recollection the events leading to a fall. Was mildly confused when he woke up the morning, but attributes this alcohol. Pt states that he has had mechanical falls at home in the past, but usually recollects what happens. Believes incident was related to ETOH intake.  On presentation, patient hemodynamically stable. Blood pressures in the 130s to 160s. Satting well on room air. INR 2.9. Hemoglobin 10.4. Pelvic x-rays with no fracture. Head CT with chronic small vessel disease but no acute abnormality. Chest x-ray shows stable cardiomegaly and an aneurysmal dilation of the thoracic aorta compared to prior chest CT is also unchanged bibasilar opacities. No focal pneumonia. Ethanol and CK levels  within normal limits. Pro BNP elevated 1583. UA negative for infection.   Patient Active Problem List   Diagnosis Date Noted  . Syncope 07/19/2013  . Cough 06/27/2013  . Advanced care planning/counseling discussion 04/12/2013  . Encounter for therapeutic drug monitoring 04/12/2013  . Insomnia 04/11/2013  . Chronic combined systolic and diastolic CHF (congestive heart failure) 09/29/2012  . Mitral regurgitation   . Ascending aortic dissection 04/16/2012  . Malignant neoplasm of prostate 10/27/2011  . Warfarin anticoagulation   . Hypertension   . GERD (gastroesophageal reflux disease)   . Aortic stenosis   . Pulmonary hypertension   . Hyperlipidemia   . Ejection fraction   . Normal nuclear stress test   . Routine general medical examination at a health care facility 08/22/2010  . Bladder cancer   . Gout   . Chronic atrial fibrillation   . HYPERTROPHY PROSTATE W/O UR OBST & OTH LUTS 09/07/2007  . DEGENERATIVE JOINT DISEASE, GENERALIZED 09/07/2007  . NEOP, MALIGNANT, BLADDER NEC 12/04/2006  . DIABETES MELLITUS, TYPE II 12/04/2006   Past Medical History: Past Medical History  Diagnosis Date  . Whooping cough   . Atrial fibrillation      Chronic,   24 hour holter, September, 2011.... atrial fib rate is controlled.... there is some bradycardia but  no marked pauses  . Diabetes mellitus     type 2  . Hypertension   . DDD (degenerative disc disease)     lumbar spine  . GERD (gastroesophageal reflux disease)   . Gout   . Fluid overload   .  ACE-inhibitor cough   . Shortness of breath     on exertion  . Aortic stenosis     mild....echo... september... 2010/ mild... echo.[name] KitchenMarland KitchenDec, 2011  . Pulmonary hypertension     81mmHg, echo, 01/2010  . Hyperlipidemia   . Hx of colonoscopy     approx. 10 years  . Warfarin anticoagulation     Atrial fib  . Ejection fraction     EF 60%, echo, 01/2010  . Normal nuclear stress test     06/2005 , also ABI normal 2007  . Hypercholesterolemia    . Fracture of metacarpal bone 11/30/11  . Aortic dissection     Surgical repair February, 2014  . Mitral regurgitation   . Bladder cancer     recurrence with TUR-B March '09  . Prostate cancer     radiation tx.    Past Surgical History: Past Surgical History  Procedure Laterality Date  . Lumbar laminectomy      '02  . Tur-bt '06, '09    . Bladder cancer biopsy  10/16/2010    negative for malignancy  . Bladder microscopic  04/13/2007    high grade papillary urothelial lesions  . 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 Prostate biopsy  09/25/2011    GLEASON 3+3=6 AND 4+3=7  . Ascending aortic root replacement N/A 04/15/2012    Procedure: ASCENDING AORTIC ROOT REPLACEMENT;  Surgeon: Gaye Pollack, MD;  Location: [facility];  Service: Open Heart Surgery;  Laterality: N/A;  . Tee without cardioversion N/A 04/15/2012    Procedure: TRANSESOPHAGEAL ECHOCARDIOGRAM (TEE);  Surgeon: Gaye Pollack, MD;  Location: [facility];  Service: Open Heart Surgery;  Laterality: N/A;  . Exploration post operative open heart  04/2012  . Cystoscopy/retrograde/ureteroscopy Bilateral 04/04/2013    Procedure: CYSTOSCOPY WITH RETROGRADE PYELOGRAM AND BLADDER BIOPSY;  Surgeon: Molli Hazard, MD;  Location: [facility];  Service: Urology;  Laterality: Bilateral;  . Herniatic disc surgery      Social History: History   Social History  . Marital Status: Single    Spouse Name: N/A    Number of Children: 0  . Years of Education: 49   Occupational History  . history and Advice worker     retired   Social History Main Topics  . Smoking status: Former Smoker -- 1.00 packs/day    Types: Cigarettes    Quit date: 03/04/1975  . Smokeless tobacco: Never Used  . Alcohol Use: 7.0 oz/week    14 drink(s) per week     Comment: liquor daily  . Drug Use: No  . Sexual Activity: Not Currently   Other Topics Concern  . None   Social History Narrative   Confirmed [employer]lor. Retired  Education officer, museum. World traveler- Ambulance person. I- ADLS. End of life Care   No CPR, no prolonged intubation, no prolonged artificial hydration or feeding, no heroic or futile measures.       [place] Sept [place]13 [place]      Last cruise - two cruises back to back to the [place] in January '15. May'15: [place] with stops in [place], [place], [place], [place], [place]. 15 days      Fall '15: 50 Days from [place]. [place], [place], [place], [place], [place], [place], [place], [place].       May '15 - 15 days up the [employer] as  far as [place]             Family History: Family History  Problem Relation Age of Onset  . Prostate cancer Father 5    passed with prostate ca  . Cancer Father   . Stroke Father   . Heart disease Mother   . Hypertension Mother     Allergies: No Known Allergies  Current Facility-Administered Medications  Medication Dose Route Frequency Provider Last Rate Last Dose  . 0.9 %  sodium chloride infusion  250 mL Intravenous PRN Shanda Howells, MD      . acetaminophen (TYLENOL) tablet 1,000 mg  1,000 mg Oral TID Shanda Howells, MD      . Derrill Memo ON 07/20/2013] allopurinol (ZYLOPRIM) tablet 300 mg  300 mg Oral Daily Shanda Howells, MD      . Derrill Memo ON 07/20/2013] amLODipine (NORVASC) tablet 2.5 mg  2.5 mg Oral Daily Shanda Howells, MD      . Derrill Memo ON 07/20/2013] aspirin tablet 325 mg  325 mg Oral Daily Shanda Howells, MD      . Derrill Memo ON 07/20/2013] atorvastatin (LIPITOR) tablet 20 mg  20 mg Oral q morning - 10a Shanda Howells, MD      . Derrill Memo ON 07/20/2013] Calcium Carbonate-Vitamin D 600-400 MG-UNIT 1 tablet  1 tablet Oral Daily Shanda Howells, MD      . Derrill Memo ON 07/20/2013] finasteride (PROSCAR) tablet 5 mg  5 mg Oral q morning - 10a Shanda Howells, MD      . Derrill Memo ON 07/20/2013] furosemide (LASIX) tablet 40 mg  40 mg Oral q morning - 10a Shanda Howells, MD      .  hydrocortisone cream 1 %   Topical BID Shanda Howells, MD      . Derrill Memo ON 07/20/2013] insulin aspart (novoLOG) injection 0-9 Units  0-9 Units Subcutaneous TID WC Shanda Howells, MD      . Derrill Memo ON 07/21/2013] ketoconazole (NIZORAL) 2 % shampoo 1 application  1 application Topical Once per day on Sun Tue Thu Sat Shanda Howells, MD      . metoprolol tartrate (LOPRESSOR) tablet 25 mg  25 mg Oral BID Shanda Howells, MD      . Derrill Memo ON 07/20/2013] ONCOVITE TABS 1 tablet  1 tablet Oral Daily Shanda Howells, MD      . Derrill Memo ON 07/20/2013] pantoprazole (PROTONIX) EC tablet 40 mg  40 mg Oral Daily Shanda Howells, MD      . Derrill Memo ON 07/20/2013] potassium chloride SA (K-DUR,KLOR-CON) CR tablet 10 mEq  10 mEq Oral Daily Shanda Howells, MD      . senna [facility]) tablet 8.6 mg  1 tablet Oral PRN Shanda Howells, MD      . sodium chloride 0.9 % injection 3 mL  3 mL Intravenous Q12H Shanda Howells, MD      . sodium chloride 0.9 % injection 3 mL  3 mL Intravenous Q12H Shanda Howells, MD      . sodium chloride 0.9 % injection 3 mL  3 mL Intravenous PRN Shanda Howells, MD      . tamsulosin (FLOMAX) capsule 0.4 mg  0.4 mg Oral QHS Shanda Howells, MD      . traZODone (DESYREL) tablet 25 mg  25 mg Oral QHS Shanda Howells, MD       Current Outpatient Prescriptions  Medication Sig Dispense Refill  . acetaminophen (TYLENOL) 500 MG tablet Take 1,000 mg by mouth 3 (three) times daily.      [name] Kitchen allopurinol (ZYLOPRIM) 300  MG tablet Take 300 mg by mouth daily.      [name] Kitchen amLODipine (NORVASC) 2.5 MG tablet Take 1 tablet (2.5 mg total) by mouth daily.  30 tablet  3  . aspirin 325 MG tablet Take 325 mg by mouth daily.      [name] Kitchen atorvastatin (LIPITOR) 20 MG tablet Take 20 mg by mouth every morning.      . Calcium Carb-Cholecalciferol (CALCIUM 600 + D PO) Take 1 tablet by mouth every morning.      [name] Kitchen desonide (DESOWEN) 0.05 % lotion Apply 1 application topically daily as needed (for face).      . Doxylamine Succinate, Sleep, (SLEEP AID PO) Take 1  tablet by mouth as needed (for sleep).       . finasteride (PROSCAR) 5 MG tablet Take 5 mg by mouth every morning.       . furosemide (LASIX) 40 MG tablet Take 1 tablet (40 mg total) by mouth every morning.  30 tablet  3  . ketoconazole (NIZORAL) 2 % shampoo Apply 1 application topically 4 (four) times a week.      . metoprolol tartrate (LOPRESSOR) 25 MG tablet Take 1 tablet (25 mg total) by mouth 2 (two) times daily.  180 tablet  3  . Multiple Vitamins-Minerals (ONCOVITE PO) Take 1 tablet by mouth daily.       . pantoprazole (PROTONIX) 40 MG tablet Take 40 mg by mouth daily.      . potassium chloride (K-DUR,KLOR-CON) 10 MEQ tablet Take 10 mEq by mouth daily.      [name] Kitchen senna (SENOKOT) 8.6 MG tablet Take 1 tablet by mouth as needed for constipation.       . sitaGLIPtin (JANUVIA) 100 MG tablet Take 100 mg by mouth daily.      . Tamsulosin HCl (FLOMAX) 0.4 MG CAPS Take 0.4 mg by mouth at bedtime.       . traZODone (DESYREL) 25 mg TABS tablet Take 0.5 tablets (25 mg total) by mouth at bedtime.  90 tablet  3  . warfarin (COUMADIN) 4 MG tablet Take 4 mg by mouth every morning.       Review Of Systems: 12 point ROS negative except as noted above in HPI.  Physical Exam: Filed Vitals:   07/19/13 2223  BP: 145/62  Pulse: 90  Temp:   Resp: 20    General: alert and cooperative HEENT: PERRLA and extra ocular movement intact, mild L upper eyelid abrasion  Heart: S1, S2 normal, no murmur, rub or gallop, regular rate and rhythm Lungs: clear to auscultation, no wheezes or rales and unlabored breathing, mild anterior chest wall tenderness Abdomen: abdomen is soft without significant tenderness, masses, organomegaly or guarding Extremities: 1-2+ edema bilaterally, 2+ peripheral pulses  Skin:no rashes, no ecchymoses Neurology: normal without focal findings, mental status, speech normal, alert and oriented x3, PERLA and reflexes normal and symmetric  Labs and Imaging: Lab Results  Component Value  Date/Time   NA 141 07/19/2013  6:33 PM   K 3.8 07/19/2013  6:33 PM   CL 102 07/19/2013  6:33 PM   CO2 25 07/19/2013  6:33 PM   BUN 9 07/19/2013  6:33 PM   CREATININE 0.77 07/19/2013  6:33 PM   CREATININE 0.88 11/19/2012 11:35 AM   GLUCOSE 120* 07/19/2013  6:33 PM   Lab Results  Component Value Date   WBC 7.6 07/19/2013   HGB 10.4* 07/19/2013   HCT 32.9* 07/19/2013   MCV 88.2 07/19/2013   PLT 200  07/19/2013   Urinalysis    Component Value Date/Time   COLORURINE YELLOW 07/19/2013 1844   APPEARANCEUR CLEAR 07/19/2013 1844   LABSPEC 1.022 07/19/2013 1844   PHURINE 5.5 07/19/2013 1844   GLUCOSEU NEGATIVE 07/19/2013 1844   HGBUR NEGATIVE 07/19/2013 1844   BILIRUBINUR NEGATIVE 07/19/2013 1844   KETONESUR 15* 07/19/2013 1844   PROTEINUR NEGATIVE 07/19/2013 1844   UROBILINOGEN 1.0 07/19/2013 1844   NITRITE NEGATIVE 07/19/2013 1844   LEUKOCYTESUR NEGATIVE 07/19/2013 1844       Dg Chest 2 View  07/19/2013   CLINICAL DATA:  Chest wall pain  EXAM: CHEST  2 VIEW  COMPARISON:  DG CHEST 2 VIEW dated 05/26/2012; DG CHEST 2 VIEW dated 05/11/2012; CT ANGIO CHEST AORTA W/CM \\T \/OR WO/CM dated 11/24/2012; DG CHEST 1V PORT dated 04/19/2012  FINDINGS: Grossly unchanged enlarged cardiac silhouette and mediastinal contours post median sternotomy. There is persistent aneurysmal dilatation of the thoracic aorta. There is persistent thickening of the right paratracheal stripe secondary to prominent vasculature. Grossly unchanged bibasilar heterogeneous opacities, left greater than right favored to represent atelectasis or scar. No new focal airspace opacities. No pleural effusion or pneumothorax. No evidence of edema. No acute osseus abnormalities, specifically, no definite displaced rib fractures. Surgical clips again overlie the right axilla.  IMPRESSION: 1.  No acute cardiopulmonary disease. 2. Grossly unchanged findings of cardiomegaly and aneurysmal dilatation of the thoracic aorta compatible with known thoracic aortic dissection  as demonstrated on prior chest CT. 3. Grossly unchanged bibasilar heterogeneous opacities, left greater than right, likely atelectasis or scar.   Electronically Signed   By: Sandi Mariscal M.D.   On: 07/19/2013 16:52   Dg Pelvis 1-2 Views  07/19/2013   CLINICAL DATA:  Tail bone pain, fell yesterday  EXAM: PELVIS - 1-2 VIEW  COMPARISON:  CT pelvis 11/24/2012  FINDINGS: Diffuse osseous demineralization.  Hip and SI joints symmetric and preserved.  Lateral margin of proximal RIGHT femur excluded.  No acute fracture, dislocation, or bone destruction.  Multiple pelvic phleboliths and scattered atherosclerotic calcifications.  Degenerative disc disease changes L4-L5.  IMPRESSION: Osseous demineralization without acute bony findings.   Electronically Signed   By: Lavonia Dana M.D.   On: 07/19/2013 20:11   Ct Head Wo Contrast  07/19/2013   CLINICAL DATA:  Fall with abrasions to the LEFT supraorbital region  EXAM: CT HEAD WITHOUT CONTRAST  TECHNIQUE: Contiguous axial images were obtained from the base of the skull through the vertex without intravenous contrast.  COMPARISON:  None  FINDINGS: Generalized atrophy.  Normal ventricular morphology.  No midline shift or mass effect.  Small vessel chronic ischemic changes of deep cerebral white matter.  No intracranial hemorrhage, mass lesion, or acute infarction.  Visualized paranasal sinuses and mastoid air cells clear.  Bones unremarkable.  Small LEFT supraorbital scalp hematoma.  Atherosclerotic calcifications in vertebral and carotid arteries at skullbase.  IMPRESSION: Atrophy with small vessel chronic ischemic changes of deep cerebral white matter.  No acute intracranial abnormalities.   Electronically Signed   By: Lavonia Dana M.D.   On: 07/19/2013 19:30     Assessment and Plan: RAEDEN SCHIPPERS is a 80 y.o. year old male presenting with syncope  Syncope: Broad differential for symptoms including alcohol-induced syncopal episode, though neurocardiogenic sources  higher up in differential diagnosis especially given her prior cardiac history. Cardiology consult formally pending. Telemetry bed. 2-D echo. MRI of the brain without contrast. Cycle cardiac enzymes. Avoid sedating meds (pt is requesting low dose ambien for  insomnia)  CV: As above. Predominantly pleuritic CP currently. No fractures on imaging today. S/p surgical repair of aortic dissection. Will get rib films. Continue full dose ASA. Cycle CEs. Cards consult pending. 2D ECHO. Continue outpt regimen. COumadin per pharmacy. No active signs of bleeding.   Anemia: Likely ACD. Hgb stable. No active signs of bleeding. Continue to follow.   DM: SSI. A1C.   Insomnia: low dose ambien.    FEN/GI: heart healthy. Low sodium diet. PPI.  Prophylaxis: coumadin per pharmacy  Disposition: pending further evaluation  Code Status: Modified DNR (please see advanced care planning under problem list).        Shanda Howells MD  Pager: [phone]

## 2013-07-19 NOTE — Progress Notes (Signed)
[facility] [address] [place] [address] [phone]  Date:  07/19/2013   Name:  Eric Rivers   DOB:  1930/05/14   MRN:  [id number]  PCP:  Cathlean Cower, MD    Chief Complaint: Fall   History of Present Illness:  Eric Rivers is a 80 y.o. very pleasant male patient who presents with the following:  Says last night he had three drinks with dinner and fell while getting up out of his easy chair.  He lay on the floor and fell asleep as he was unable to get up himself.  He called EMS and they checked him and advised he be transported to the hospital.  He declined.  Is in chronic AF on coumadin.  He struck his left forehead on a table on the way down.  Claims no LOC, neuro or visual symptoms.  On awakening this morning he found he has no neuro or visual symptoms and no headache.  He has chronic balance issues not sufficiently severe to require a cane.  He is complaining of some left inframammary chest pain that is pleuritic and associated with tenderness.  He has no shortness of breath.  No nausea or vomiting.  No coughing blood.  No extremity injury or complaint.  No back or neck pain.  No improvement with over the counter medications or other home remedies. Denies other complaint or health concern today.   Patient Active Problem List   Diagnosis Date Noted  . Cough 06/27/2013  . Advanced care planning/counseling discussion 04/12/2013  . Encounter for therapeutic drug monitoring 04/12/2013  . Insomnia 04/11/2013  . Chronic combined systolic and diastolic CHF (congestive heart failure) 09/29/2012  . Mitral regurgitation   . Ascending aortic dissection 04/16/2012  . Malignant neoplasm of prostate 10/27/2011  . Warfarin anticoagulation   . Hypertension   . GERD (gastroesophageal reflux disease)   . Aortic stenosis   . Pulmonary hypertension   . Hyperlipidemia   . Ejection fraction   . Normal nuclear stress test   . Routine general medical examination  at a health care facility 08/22/2010  . Bladder cancer   . Gout   . Chronic atrial fibrillation   . HYPERTROPHY PROSTATE W/O UR OBST & OTH LUTS 09/07/2007  . DEGENERATIVE JOINT DISEASE, GENERALIZED 09/07/2007  . NEOP, MALIGNANT, BLADDER NEC 12/04/2006  . DIABETES MELLITUS, TYPE II 12/04/2006    Past Medical History  Diagnosis Date  . Whooping cough   . Atrial fibrillation      Chronic,   24 hour holter, September, 2011.... atrial fib rate is controlled.... there is some bradycardia but  no marked pauses  . Diabetes mellitus     type 2  . Hypertension   . DDD (degenerative disc disease)     lumbar spine  . GERD (gastroesophageal reflux disease)   . Gout   . Fluid overload   . ACE-inhibitor cough   . Shortness of breath     on exertion  . Aortic stenosis     mild....echo... september... 2010/ mild... echo.[name] KitchenMarland KitchenDec, 2011  . Pulmonary hypertension     86mmHg, echo, 01/2010  . Hyperlipidemia   . Hx of colonoscopy     approx. 10 years  . Warfarin anticoagulation     Atrial fib  . Ejection fraction     EF 60%, echo, 01/2010  . Normal nuclear stress test     06/2005 , also ABI normal 2007  . Hypercholesterolemia   .  Fracture of metacarpal bone 11/30/11  . Aortic dissection     Surgical repair February, 2014  . Mitral regurgitation   . Bladder cancer     recurrence with TUR-B March '09  . Prostate cancer     radiation tx.    Past Surgical History  Procedure Laterality Date  . Lumbar laminectomy      '02  . Tur-bt '06, '09    . Bladder cancer biopsy  10/16/2010    negative for malignancy  . Bladder microscopic  04/13/2007    high grade papillary urothelial lesions  . 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 Prostate biopsy  09/25/2011    GLEASON 3+3=6 AND 4+3=7  . Ascending aortic root replacement N/A 04/15/2012    Procedure: ASCENDING AORTIC ROOT REPLACEMENT;  Surgeon: Gaye Pollack, MD;  Location: [facility];  Service: Open Heart Surgery;   Laterality: N/A;  . Tee without cardioversion N/A 04/15/2012    Procedure: TRANSESOPHAGEAL ECHOCARDIOGRAM (TEE);  Surgeon: Gaye Pollack, MD;  Location: [facility];  Service: Open Heart Surgery;  Laterality: N/A;  . Exploration post operative open heart  04/2012  . Cystoscopy/retrograde/ureteroscopy Bilateral 04/04/2013    Procedure: CYSTOSCOPY WITH RETROGRADE PYELOGRAM AND BLADDER BIOPSY;  Surgeon: Molli Hazard, MD;  Location: [facility];  Service: Urology;  Laterality: Bilateral;    History  Substance Use Topics  . Smoking status: Former Smoker -- 1.00 packs/day    Types: Cigarettes    Quit date: 03/04/1975  . Smokeless tobacco: Never Used  . Alcohol Use: 7.0 oz/week    14 drink(s) per week     Comment: liquor daily    Family History  Problem Relation Age of Onset  . Prostate cancer Father 67    passed with prostate ca  . Cancer Father   . Stroke Father   . Heart disease Mother   . Hypertension Mother     No Known Allergies  Medication list has been reviewed and updated.  Current Outpatient Prescriptions on File Prior to Visit  Medication Sig Dispense Refill  . allopurinol (ZYLOPRIM) 300 MG tablet Take 300 mg by mouth daily.      [name] Kitchen amLODipine (NORVASC) 2.5 MG tablet Take 1 tablet (2.5 mg total) by mouth daily.  30 tablet  3  . aspirin 325 MG tablet Take 325 mg by mouth daily.      [name] Kitchen atorvastatin (LIPITOR) 20 MG tablet Take 20 mg by mouth every morning.      . Calcium Carbonate-Vitamin D (CALCIUM + D PO) Take 1 tablet by mouth daily.      . Doxylamine Succinate, Sleep, (SLEEP AID PO) Take 1 tablet by mouth as needed.      . finasteride (PROSCAR) 5 MG tablet Take 5 mg by mouth every morning.       . furosemide (LASIX) 40 MG tablet Take 1 tablet (40 mg total) by mouth every morning.  30 tablet  3  . metoprolol tartrate (LOPRESSOR) 25 MG tablet Take 1 tablet (25 mg total) by mouth 2 (two) times daily.  180 tablet  3  . Multiple Vitamins-Minerals (ONCOVITE PO) Take 1 tablet by  mouth daily.       . pantoprazole (PROTONIX) 40 MG tablet Take 40 mg by mouth daily.      . potassium chloride (K-DUR,KLOR-CON) 10 MEQ tablet Take 10 mEq by mouth daily.      [name] Kitchen senna (SENOKOT) 8.6 MG tablet Take 1  tablet by mouth as needed.       . sitaGLIPtin (JANUVIA) 100 MG tablet Take 100 mg by mouth daily.      . Tamsulosin HCl (FLOMAX) 0.4 MG CAPS Take 0.4 mg by mouth at bedtime.       . traZODone (DESYREL) 25 mg TABS tablet Take 0.5 tablets (25 mg total) by mouth at bedtime.  90 tablet  3  . warfarin (COUMADIN) 4 MG tablet Take 4 mg by mouth every morning.       No current facility-administered medications on file prior to visit.    Review of Systems:  As per HPI, otherwise negative.    Physical Examination: Filed Vitals:   07/19/13 1408  BP: 132/71  Pulse: 82  Temp: 98.1 F (36.7 C)  Resp: 18   Filed Vitals:   07/19/13 1408  Height: 5\' 7"  (1.702 m)  Weight: 229 lb (103.874 kg)   Body mass index is 35.86 kg/(m^2). Ideal Body Weight: Weight in (lb) to have BMI = 25: 159.3  GEN: WDWN, NAD, Non-toxic, A & O x 3 HEENT: contusion and abrasion left eyebrow, Normocephalic. Neck supple. No masses, No LAD. Ears and Nose: No external deformity. CV: irregularly irregular rhytm, No M/G/R. No JVD. No thrill. No extra heart sounds. Chest:  Tender left anterior chest wall with no ecchymosis or crepitus or flail PULM: CTA B, no wheezes, crackles, rhonchi. No retractions. No resp. distress. No accessory muscle use. ABD: S, NT, ND, +BS. No rebound. No HSM. EXTR: No c/c/e NEURO Sharp and crisp mentally unsteady gait.  PSYCH: Normally interactive. Conversant. Not depressed or anxious appearing.  Calm demeanor.    Assessment and Plan: Syncope and collapse Closed head injury  Chest wall pain Atrial fibrillation Chronic anticoagulation Too many risks so we have discussed his care and he is referred to the ER via EMS  Signed,  Ellison Carwin, MD   Results for orders placed  in visit on 07/19/13  POCT CBC      Result Value Ref Range   WBC 8.1  4.6 - 10.2 K/uL   Lymph, poc 1.2  0.6 - 3.4   POC LYMPH PERCENT 15.4  10 - 50 %L   MID (cbc) 0.6  0 - 0.9   POC MID % 7.3  0 - 12 %M   POC Granulocyte 6.3  2 - 6.9   Granulocyte percent 77.3  37 - 80 %G   RBC 4.13 (*) 4.69 - 6.13 M/uL   Hemoglobin 11.1 (*) 14.1 - 18.1 g/dL   HCT, POC 36.9 (*) 43.5 - 53.7 %   MCV 89.3  80 - 97 fL   MCH, POC 26.9 (*) 27 - 31.2 pg   MCHC 30.1 (*) 31.8 - 35.4 g/dL   RDW, POC 17.8     Platelet Count, POC 265  142 - 424 K/uL   MPV 8.5  0 - 99.8 fL   [facility] reading (PRIMARY) by  Dr. Ouida Sills. No acute disease.

## 2013-07-19 NOTE — ED Notes (Signed)
Patient stayed in chair to rest.

## 2013-07-19 NOTE — ED Notes (Signed)
Patient given male urinal to utilize. Dr. Aline Brochure remains at the bedside.

## 2013-07-20 ENCOUNTER — Observation Stay (HOSPITAL_COMMUNITY): Payer: Medicare Other

## 2013-07-20 ENCOUNTER — Other Ambulatory Visit: Payer: Self-pay

## 2013-07-20 DIAGNOSIS — IMO0002 Reserved for concepts with insufficient information to code with codable children: Secondary | ICD-10-CM

## 2013-07-20 DIAGNOSIS — I359 Nonrheumatic aortic valve disorder, unspecified: Secondary | ICD-10-CM

## 2013-07-20 DIAGNOSIS — I7101 Dissection of thoracic aorta: Secondary | ICD-10-CM

## 2013-07-20 DIAGNOSIS — I369 Nonrheumatic tricuspid valve disorder, unspecified: Secondary | ICD-10-CM

## 2013-07-20 DIAGNOSIS — S2239XA Fracture of one rib, unspecified side, initial encounter for closed fracture: Secondary | ICD-10-CM | POA: Diagnosis not present

## 2013-07-20 DIAGNOSIS — I71019 Dissection of thoracic aorta, unspecified: Secondary | ICD-10-CM

## 2013-07-20 DIAGNOSIS — Z7189 Other specified counseling: Secondary | ICD-10-CM

## 2013-07-20 DIAGNOSIS — I4891 Unspecified atrial fibrillation: Secondary | ICD-10-CM | POA: Diagnosis present

## 2013-07-20 DIAGNOSIS — S298XXA Other specified injuries of thorax, initial encounter: Secondary | ICD-10-CM | POA: Diagnosis not present

## 2013-07-20 DIAGNOSIS — R55 Syncope and collapse: Secondary | ICD-10-CM | POA: Diagnosis not present

## 2013-07-20 LAB — CBC WITH DIFFERENTIAL/PLATELET
Basophils Absolute: 0 10*3/uL (ref 0.0–0.1)
Basophils Relative: 1 % (ref 0–1)
EOS PCT: 3 % (ref 0–5)
Eosinophils Absolute: 0.2 10*3/uL (ref 0.0–0.7)
HEMATOCRIT: 31.4 % — AB (ref 39.0–52.0)
Hemoglobin: 9.9 g/dL — ABNORMAL LOW (ref 13.0–17.0)
LYMPHS PCT: 14 % (ref 12–46)
Lymphs Abs: 0.9 10*3/uL (ref 0.7–4.0)
MCH: 28 pg (ref 26.0–34.0)
MCHC: 31.5 g/dL (ref 30.0–36.0)
MCV: 88.7 fL (ref 78.0–100.0)
MONO ABS: 0.8 10*3/uL (ref 0.1–1.0)
Monocytes Relative: 13 % — ABNORMAL HIGH (ref 3–12)
Neutro Abs: 4.3 10*3/uL (ref 1.7–7.7)
Neutrophils Relative %: 69 % (ref 43–77)
PLATELETS: 175 10*3/uL (ref 150–400)
RBC: 3.54 MIL/uL — ABNORMAL LOW (ref 4.22–5.81)
RDW: 17.3 % — ABNORMAL HIGH (ref 11.5–15.5)
WBC: 6.2 10*3/uL (ref 4.0–10.5)

## 2013-07-20 LAB — COMPREHENSIVE METABOLIC PANEL
ALBUMIN: 4.2 g/dL (ref 3.5–5.2)
ALBUMIN: 3.1 g/dL — AB (ref 3.5–5.2)
ALT: 19 U/L (ref 0–53)
ALT: 15 U/L (ref 0–53)
AST: 31 U/L (ref 0–37)
AST: 25 U/L (ref 0–37)
Alkaline Phosphatase: 110 U/L (ref 39–117)
Alkaline Phosphatase: 86 U/L (ref 39–117)
BUN: 10 mg/dL (ref 6–23)
BUN: 10 mg/dL (ref 6–23)
CHLORIDE: 101 meq/L (ref 96–112)
CO2: 29 mEq/L (ref 19–32)
CO2: 24 mEq/L (ref 19–32)
CREATININE: 0.73 mg/dL (ref 0.50–1.35)
Calcium: 9.3 mg/dL (ref 8.4–10.5)
Calcium: 8.5 mg/dL (ref 8.4–10.5)
Chloride: 106 mEq/L (ref 96–112)
Creat: 0.76 mg/dL (ref 0.50–1.35)
GFR calc Af Amer: >90 mL/min (ref >90)
GFR calc non Af Amer: 85 mL/min — ABNORMAL LOW (ref >90)
Glucose, Bld: 108 mg/dL — ABNORMAL HIGH (ref 70–99)
Glucose, Bld: 109 mg/dL — ABNORMAL HIGH (ref 70–99)
Potassium: 4.1 mEq/L (ref 3.5–5.3)
Potassium: 3.7 mEq/L (ref 3.7–5.3)
Sodium: 144 mEq/L (ref 135–145)
Sodium: 143 mEq/L (ref 137–147)
TOTAL PROTEIN: 7.4 g/dL (ref 6.0–8.3)
TOTAL PROTEIN: 6.4 g/dL (ref 6.0–8.3)
Total Bilirubin: 0.8 mg/dL (ref 0.2–1.2)
Total Bilirubin: 0.9 mg/dL (ref 0.3–1.2)

## 2013-07-20 LAB — URINE CULTURE
COLONY COUNT: NO GROWTH
Culture: NO GROWTH

## 2013-07-20 LAB — GLUCOSE, CAPILLARY
GLUCOSE-CAPILLARY: 115 mg/dL — AB (ref 70–99)
Glucose-Capillary: 229 mg/dL — ABNORMAL HIGH (ref 70–99)
Glucose-Capillary: 168 mg/dL — ABNORMAL HIGH (ref 70–99)
Glucose-Capillary: 105 mg/dL — ABNORMAL HIGH (ref 70–99)

## 2013-07-20 LAB — PROTIME-INR
INR: 3.35 — ABNORMAL HIGH (ref 0.00–1.49)
Prothrombin Time: 32.7 seconds — ABNORMAL HIGH (ref 11.6–15.2)

## 2013-07-20 LAB — TROPONIN I
Troponin I: <0.3 ng/mL (ref <0.30)
Troponin I: <0.3 ng/mL (ref <0.30)

## 2013-07-20 LAB — AMMONIA: AMMONIA: 18 umol/L (ref 11–60)

## 2013-07-20 LAB — HEMOGLOBIN A1C
Hgb A1c MFr Bld: 6.9 % — ABNORMAL HIGH (ref <5.7)
Mean Plasma Glucose: 151 mg/dL — ABNORMAL HIGH (ref <117)

## 2013-07-20 LAB — CK: Total CK: 109 U/L (ref 7–232)

## 2013-07-20 LAB — TSH: TSH: 1.16 u[IU]/mL (ref 0.350–4.500)

## 2013-07-20 MED ORDER — LORAZEPAM 2 MG/ML IJ SOLN: 1.0000 mg | Freq: Four times a day (QID) | INTRAMUSCULAR | Status: DC | PRN

## 2013-07-20 MED ORDER — FOLIC ACID 1 MG PO TABS
1.0000 mg | ORAL_TABLET | Freq: Every day | ORAL | Status: DC
  Administered 2013-07-20 – 2013-07-21 (×2): 1 mg via ORAL
  Filled 2013-07-20 (×2): qty 1

## 2013-07-20 MED ORDER — WARFARIN - PHARMACIST DOSING INPATIENT: Freq: Every day | Status: DC

## 2013-07-20 MED ORDER — BENZONATATE 100 MG PO CAPS
100.0000 mg | ORAL_CAPSULE | Freq: Three times a day (TID) | ORAL | Status: DC | PRN
  Filled 2013-07-20: qty 1

## 2013-07-20 MED ORDER — HYDROCOD POLST-CHLORPHEN POLST 10-8 MG/5ML PO LQCR: 5.0000 mL | Freq: Two times a day (BID) | ORAL | Status: DC | PRN

## 2013-07-20 MED ORDER — VITAMIN B-1 100 MG PO TABS
100.0000 mg | ORAL_TABLET | Freq: Every day | ORAL | Status: DC
  Administered 2013-07-20 – 2013-07-21 (×2): 100 mg via ORAL
  Filled 2013-07-20 (×2): qty 1

## 2013-07-20 MED ORDER — LORAZEPAM 1 MG PO TABS: 1.0000 mg | ORAL_TABLET | Freq: Four times a day (QID) | ORAL | Status: DC | PRN

## 2013-07-20 MED ORDER — ADULT MULTIVITAMIN W/MINERALS CH
1.0000 | ORAL_TABLET | Freq: Every day | ORAL | Status: DC
  Administered 2013-07-20 – 2013-07-21 (×2): 1 via ORAL
  Filled 2013-07-20 (×2): qty 1

## 2013-07-20 MED ORDER — AMLODIPINE BESYLATE 2.5 MG PO TABS: 2.5000 mg | ORAL_TABLET | Freq: Every day | ORAL | Status: DC

## 2013-07-20 MED ORDER — THIAMINE HCL 100 MG/ML IJ SOLN
100.0000 mg | Freq: Every day | INTRAMUSCULAR | Status: DC
  Filled 2013-07-20: qty 1

## 2013-07-20 MED ORDER — LINAGLIPTIN 5 MG PO TABS
5.0000 mg | ORAL_TABLET | Freq: Every day | ORAL | Status: DC
  Administered 2013-07-20 – 2013-07-21 (×2): 5 mg via ORAL
  Filled 2013-07-20 (×2): qty 1

## 2013-07-20 MED ORDER — TRAMADOL HCL 50 MG PO TABS
50.0000 mg | ORAL_TABLET | Freq: Four times a day (QID) | ORAL | Status: DC | PRN
  Administered 2013-07-20 – 2013-07-21 (×2): 50 mg via ORAL
  Filled 2013-07-20 (×2): qty 1

## 2013-07-20 NOTE — Evaluation (Signed)
Physical Therapy Evaluation Patient Details Name: Eric Rivers MRN: [id number] DOB: March 10, 1930 Today's Date: 07/20/2013   History of Present Illness  80 yo male presenting for syncope  Clinical Impression  Patient demonstrates baseline mobility and function. Spoke with patient at length regarding energy conservation and safety with mobility. No further acute PT needs.    Follow Up Recommendations No PT follow up    Equipment Recommendations  None recommended by PT    Recommendations for Other Services       Precautions / Restrictions Precautions Precautions: Fall      Mobility  Bed Mobility                  Transfers Overall transfer level: Independent                  Ambulation/Gait Ambulation/Gait assistance: Independent Ambulation Distance (Feet): 320 Feet Assistive device: None       General Gait Details: steady with ambulation, desaturated to 89% on room air with 3/4 DOE  Stairs            Wheelchair Mobility    Modified Rankin (Stroke Patients Only)       Balance Overall balance assessment: History of Falls                               Standardized Balance Assessment Standardized Balance Assessment : Dynamic Gait Index   Dynamic Gait Index Level Surface: Normal Change in Gait Speed: Normal Gait with Horizontal Head Turns: Mild Impairment Gait with Vertical Head Turns: Mild Impairment Gait and Pivot Turn: Normal Step Over Obstacle: Moderate Impairment Step Around Obstacles: Mild Impairment Steps:  (did not assess)       Pertinent Vitals/Pain 89% after ambulation rebounded to 93% with rest    Home Living Family/patient expects to be discharged to:: Private residence Living Arrangements: Alone   Type of Home: House Home Access: Ramped entrance     Home Layout: One level Home Equipment: None      Prior Function Level of Independence: Independent               Hand Dominance   Dominant Hand: Right    Extremity/Trunk Assessment   Upper Extremity Assessment: Overall WFL for tasks assessed           Lower Extremity Assessment: Overall WFL for tasks assessed         Communication   Communication: No difficulties  Cognition Arousal/Alertness: Awake/alert Behavior During Therapy: WFL for tasks assessed/performed Overall Cognitive Status: Within Functional Limits for tasks assessed                      General Comments      Exercises        Assessment/Plan    PT Assessment Patent does not need any further PT services  PT Diagnosis     PT Problem List    PT Treatment Interventions     PT Goals (Current goals can be found in the Care Plan section) Acute Rehab PT Goals Patient Stated Goal: to go home PT Goal Formulation: No goals set, d/c therapy    Frequency     Barriers to discharge        Co-evaluation               End of Session Equipment Utilized During Treatment: Gait belt Activity Tolerance: Patient tolerated treatment well Patient  left: in chair;with call bell/phone within reach Nurse Communication: Mobility status         Time: 1017-5102 PT Time Calculation (min): 24 min   Charges:   PT Evaluation $Initial PT Evaluation Tier I: 1 Procedure PT Treatments $Gait Training: 8-22 mins $Self Care/Home Management: 8-22   PT G Codes:          Duncan Dull 07/20/2013, 4:49 PM Alben Deeds, [place] DPT  [phone]

## 2013-07-20 NOTE — Progress Notes (Addendum)
Patient ID: Eric Rivers  male  VQQ:[id number]    DOB: September 06, 1930    DOA: 07/19/2013  PCP: Cathlean Cower, MD     Assessment/Plan: Principal Problem:   Syncope: Possibly due to alcohol-induced syncopal episode (patient himself reports he had one- too many vodkas as he was stressed out, air/ heating system was not working in the house), however neurocardiogenic sources also in differential given his strong cardiac history. UA showed 15 ketones - MRI of the brain negative for any stroke , cardiac enzymes negative so far  - 2-D echo pending  - Has some coughing, pleuritic chest pain, rib x-rays pending  - Patient also reports postural hypotension at home, will also await physical therapy evaluation  - The patient's heart rate in 40s while sleeping, 2.45 second and 1.71 second pauses last night and today, will discontinue metoprolol, await 2-D echocardiogram. May need cardiology consult.  Active Problems:   Hypertension - Currently stable  Cough with pleuritic chest pain - Chest x-ray did not show any pneumonia, awaiting rib x-rays, no rhonchi or wheezing  Atrial fibrillation  - Currently rate controlled, on Coumadin   Alcohol abuse;  - Patient counseled on quitting alcohol, placed on CIWA protocol  Diabetes mellitus - Patient refuses sliding scale insulin, placed him on oral hypoglycemics per his home regimen  DVT Prophylaxis:On Coumadin   Code Status:  Family Communication:Patient alert and oriented x4   Disposition:Hopefully DC home in a.m.   Consultants:  None   Procedures:  None   Antibiotics:  None    Subjective: Patient seen and examined, denies any nausea vomiting or abdominal pain, complaining of pleuritic chest pain with coughing   Objective: Weight change:   Intake/Output Summary (Last 24 hours) at 07/20/13 1214 Last data filed at 07/20/13 0700  Gross per 24 hour  Intake    720 ml  Output    100 ml  Net    620 ml   Blood pressure 123/88,  pulse 65, temperature 97.5 F (36.4 C), temperature source Oral, resp. rate 18, height 5' 7.5" (1.715 m), weight 105.2 kg (231 lb 14.8 oz), SpO2 90.00%.  Physical Exam: General: Alert and awake, oriented x3, not in any acute distress. CVS: S1-S2 clear, no murmur rubs or gallops Chest: clear to auscultation bilaterally, no wheezing, rales or rhonchi Abdomen: soft nontender, nondistended, normal bowel sounds  Extremities: no cyanosis, clubbing or edema noted bilaterally Neuro: Cranial nerves II-XII intact, no focal neurological deficits  Lab Results: Basic Metabolic Panel:  Recent Labs Lab 07/19/13 1833 07/20/13 0545  NA 141 143  K 3.8 3.7  CL 102 106  CO2 25 24  GLUCOSE 120* 109*  BUN 9 10  CREATININE 0.77 0.73  CALCIUM 8.7 8.5   Liver Function Tests:  Recent Labs Lab 07/19/13 1833 07/20/13 0545  AST 31 25  ALT 18 15  ALKPHOS 101 86  BILITOT 0.9 0.9  PROT 7.0 6.4  ALBUMIN 3.5 3.1*   No results found for this basename: LIPASE, AMYLASE,  in the last 168 hours  Recent Labs Lab 07/19/13 2326  AMMONIA 18   CBC:  Recent Labs Lab 07/19/13 1833 07/20/13 0545  WBC 7.6 6.2  NEUTROABS 5.7 4.3  HGB 10.4* 9.9*  HCT 32.9* 31.4*  MCV 88.2 88.7  PLT 200 175   Cardiac Enzymes:  Recent Labs Lab 07/19/13 1833 07/19/13 2326 07/20/13 0545 07/20/13 1055  CKTOTAL 143  --  109  --   TROPONINI <0.30 <0.30 <0.30 <0.30  BNP: No components found with this basename: POCBNP,  CBG:  Recent Labs Lab 07/20/13 0827 07/20/13 1130  GLUCAP 229* 115*     Micro Results: No results found for this or any previous visit (from the past 240 hour(s)).  Studies/Results: Dg Chest 2 View  07/19/2013   CLINICAL DATA:  Chest wall pain  EXAM: CHEST  2 VIEW  COMPARISON:  DG CHEST 2 VIEW dated 05/26/2012; DG CHEST 2 VIEW dated 05/11/2012; CT ANGIO CHEST AORTA W/CM \\T \/OR WO/CM dated 11/24/2012; DG CHEST 1V PORT dated 04/19/2012  FINDINGS: Grossly unchanged enlarged cardiac silhouette  and mediastinal contours post median sternotomy. There is persistent aneurysmal dilatation of the thoracic aorta. There is persistent thickening of the right paratracheal stripe secondary to prominent vasculature. Grossly unchanged bibasilar heterogeneous opacities, left greater than right favored to represent atelectasis or scar. No new focal airspace opacities. No pleural effusion or pneumothorax. No evidence of edema. No acute osseus abnormalities, specifically, no definite displaced rib fractures. Surgical clips again overlie the right axilla.  IMPRESSION: 1.  No acute cardiopulmonary disease. 2. Grossly unchanged findings of cardiomegaly and aneurysmal dilatation of the thoracic aorta compatible with known thoracic aortic dissection as demonstrated on prior chest CT. 3. Grossly unchanged bibasilar heterogeneous opacities, left greater than right, likely atelectasis or scar.   Electronically Signed   By: Sandi Mariscal M.D.   On: 07/19/2013 16:52   Dg Pelvis 1-2 Views  07/19/2013   CLINICAL DATA:  Tail bone pain, fell yesterday  EXAM: PELVIS - 1-2 VIEW  COMPARISON:  CT pelvis 11/24/2012  FINDINGS: Diffuse osseous demineralization.  Hip and SI joints symmetric and preserved.  Lateral margin of proximal RIGHT femur excluded.  No acute fracture, dislocation, or bone destruction.  Multiple pelvic phleboliths and scattered atherosclerotic calcifications.  Degenerative disc disease changes L4-L5.  IMPRESSION: Osseous demineralization without acute bony findings.   Electronically Signed   By: Lavonia Dana M.D.   On: 07/19/2013 20:11   Ct Head Wo Contrast  07/19/2013   CLINICAL DATA:  Fall with abrasions to the LEFT supraorbital region  EXAM: CT HEAD WITHOUT CONTRAST  TECHNIQUE: Contiguous axial images were obtained from the base of the skull through the vertex without intravenous contrast.  COMPARISON:  None  FINDINGS: Generalized atrophy.  Normal ventricular morphology.  No midline shift or mass effect.  Small  vessel chronic ischemic changes of deep cerebral white matter.  No intracranial hemorrhage, mass lesion, or acute infarction.  Visualized paranasal sinuses and mastoid air cells clear.  Bones unremarkable.  Small LEFT supraorbital scalp hematoma.  Atherosclerotic calcifications in vertebral and carotid arteries at skullbase.  IMPRESSION: Atrophy with small vessel chronic ischemic changes of deep cerebral white matter.  No acute intracranial abnormalities.   Electronically Signed   By: Lavonia Dana M.D.   On: 07/19/2013 19:30   Mr Brain Wo Contrast  07/20/2013   CLINICAL DATA:  80 year old male with syncope. Initial encounter.  EXAM: MRI HEAD WITHOUT CONTRAST  TECHNIQUE: Multiplanar, multiecho pulse sequences of the brain and surrounding structures were obtained without intravenous contrast.  COMPARISON:  Head CT without contrast 07/1913.  FINDINGS: Study is intermittently degraded by motion artifact despite repeated imaging attempts.  Cerebral volume is within normal limits for age. No restricted diffusion to suggest acute infarction. No midline shift, mass effect, evidence of mass lesion, ventriculomegaly, extra-axial collection or acute intracranial hemorrhage. Cervicomedullary junction and pituitary are within normal limits. Negative visualized cervical spine. Normal bone marrow signal.  Major intracranial vascular  flow voids are preserved.  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 No other cortical encephalomalacia. Mild for age patchy and scattered cerebral white matter T2 and FLAIR hyperintensity, nonspecific. Deep gray matter nuclei, brainstem and cerebellum are within normal limits for age. Visible internal auditory structures appear normal.  Mastoids are clear. Visualized orbit soft tissues are within normal limits. Paranasal sinuses are clear. Chronic appearing scalp soft tissue defect along the left superior vertex (series 11, image  13), probably soft tissue scarring. Negative scalp soft tissues otherwise.  IMPRESSION: 1.  No acute intracranial abnormality. 2. Mild for age chronic ischemic changes.   Electronically Signed   By: Lars Pinks M.D.   On: 07/20/2013 10:46    Medications: Scheduled Meds: . acetaminophen  1,000 mg Oral TID  . allopurinol  300 mg Oral Daily  . amLODipine  2.5 mg Oral Daily  . aspirin  325 mg Oral Daily  . atorvastatin  20 mg Oral q morning - 10a  . calcium-vitamin D  1 tablet Oral Q breakfast  . finasteride  5 mg Oral q morning - 47M  . folic acid  1 mg Oral Daily  . furosemide  40 mg Oral q morning - 10a  . hydrocortisone cream   Topical BID  . [START ON 07/21/2013] ketoconazole  1 application Topical Once per day on Sun Tue Thu Sat  . linagliptin  5 mg Oral Daily  . metoprolol tartrate  25 mg Oral BID  . multivitamin with minerals  1 tablet Oral Daily  . pantoprazole  40 mg Oral Daily  . potassium chloride  10 mEq Oral Daily  . sodium chloride  3 mL Intravenous Q12H  . sodium chloride  3 mL Intravenous Q12H  . tamsulosin  0.4 mg Oral QHS  . thiamine  100 mg Oral Daily  . traZODone  25 mg Oral QHS  . Warfarin - Pharmacist Dosing Inpatient   Does not apply q1800      LOS: 1 day   Eric Rivers M.D. [facility] 07/20/2013, 12:14 PM Pager: [phone]  If 7PM-7AM, please contact night-coverage [email or web address] Password [email or web address]  **Disclaimer: This note was dictated with voice recognition software. Similar sounding words can inadvertently be transcribed and this note may contain transcription errors which may not have been corrected upon publication of note.**

## 2013-07-20 NOTE — Progress Notes (Signed)
Echo Lab  2D Echocardiogram completed.  [place], RDCS 07/20/2013 4:05 PM

## 2013-07-20 NOTE — Progress Notes (Signed)
UR completed 

## 2013-07-20 NOTE — Progress Notes (Addendum)
ANTICOAGULATION CONSULT NOTE - Initial Consult  Pharmacy Consult for Coumadin Indication: atrial fibrillation  No Known Allergies  Patient Measurements:    Vital Signs: Temp: 97.9 F (36.6 C) (05/20 0008) Temp src: Oral (05/20 0008) BP: 113/56 mmHg (05/20 0008) Pulse Rate: 79 (05/20 0008)  Labs:  Recent Labs  07/19/13 1521 07/19/13 1526 07/19/13 1833 07/19/13 2326  HGB  --  11.1* 10.4*  --   HCT  --  36.9* 32.9*  --   PLT  --   --  200  --   LABPROT 29.5*  --  29.8*  --   INR 2.90*  --  2.96*  --   CREATININE  --   --  0.77  --   CKTOTAL  --   --  143  --   TROPONINI  --   --  <0.30 <0.30    The CrCl is unknown because both a height and weight (above a minimum accepted value) are required for this calculation.   Medical History: Past Medical History  Diagnosis Date  . Whooping cough   . Atrial fibrillation      Chronic,   24 hour holter, September, 2011.... atrial fib rate is controlled.... there is some bradycardia but  no marked pauses  . Diabetes mellitus     type 2  . Hypertension   . DDD (degenerative disc disease)     lumbar spine  . GERD (gastroesophageal reflux disease)   . Gout   . Fluid overload   . ACE-inhibitor cough   . Shortness of breath     on exertion  . Aortic stenosis     mild....echo... september... 2010/ mild... echo.[name] KitchenMarland KitchenDec, 2011  . Pulmonary hypertension     52mmHg, echo, 01/2010  . Hyperlipidemia   . Hx of colonoscopy     approx. 10 years  . Warfarin anticoagulation     Atrial fib  . Ejection fraction     EF 60%, echo, 01/2010  . Normal nuclear stress test     06/2005 , also ABI normal 2007  . Hypercholesterolemia   . Fracture of metacarpal bone 11/30/11  . Aortic dissection     Surgical repair February, 2014  . Mitral regurgitation   . Bladder cancer     recurrence with TUR-B March '09  . Prostate cancer     radiation tx.    Medications:  Prescriptions prior to admission  Medication Sig Dispense Refill  .  acetaminophen (TYLENOL) 500 MG tablet Take 1,000 mg by mouth 3 (three) times daily.      [name] Kitchen allopurinol (ZYLOPRIM) 300 MG tablet Take 300 mg by mouth daily.      [name] Kitchen amLODipine (NORVASC) 2.5 MG tablet Take 1 tablet (2.5 mg total) by mouth daily.  30 tablet  3  . aspirin 325 MG tablet Take 325 mg by mouth daily.      [name] Kitchen atorvastatin (LIPITOR) 20 MG tablet Take 20 mg by mouth every morning.      . Calcium Carb-Cholecalciferol (CALCIUM 600 + D PO) Take 1 tablet by mouth every morning.      [name] Kitchen desonide (DESOWEN) 0.05 % lotion Apply 1 application topically daily as needed (for face).      . Doxylamine Succinate, Sleep, (SLEEP AID PO) Take 1 tablet by mouth as needed (for sleep).       . finasteride (PROSCAR) 5 MG tablet Take 5 mg by mouth every morning.       . furosemide (LASIX) 40 MG tablet  Take 1 tablet (40 mg total) by mouth every morning.  30 tablet  3  . ketoconazole (NIZORAL) 2 % shampoo Apply 1 application topically 4 (four) times a week.      . metoprolol tartrate (LOPRESSOR) 25 MG tablet Take 1 tablet (25 mg total) by mouth 2 (two) times daily.  180 tablet  3  . Multiple Vitamins-Minerals (ONCOVITE PO) Take 1 tablet by mouth daily.       . pantoprazole (PROTONIX) 40 MG tablet Take 40 mg by mouth daily.      . potassium chloride (K-DUR,KLOR-CON) 10 MEQ tablet Take 10 mEq by mouth daily.      [name] Kitchen senna (SENOKOT) 8.6 MG tablet Take 1 tablet by mouth as needed for constipation.       . sitaGLIPtin (JANUVIA) 100 MG tablet Take 100 mg by mouth daily.      . Tamsulosin HCl (FLOMAX) 0.4 MG CAPS Take 0.4 mg by mouth at bedtime.       . traZODone (DESYREL) 25 mg TABS tablet Take 0.5 tablets (25 mg total) by mouth at bedtime.  90 tablet  3  . warfarin (COUMADIN) 4 MG tablet Take 4 mg by mouth every morning.        Assessment: 80 yo male admitted with syncope, h/o Afib, to continue Coumadin Goal of Therapy:  INR 2-3 Monitor platelets by anticoagulation protocol: Yes   Plan:  Daily INR  Bronson Curb Abbott 07/20/2013,12:33 AM  Addendum: INR supra-therapeutic at 3.35. - Likely interaction with recent alcohol binge.   Plan: Hold Coumadin today. Recheck INR in AM.  Sloan Leiter, PharmD, BCPS Clinical Pharmacist [phone] 07/20/2013, 10:59 AM

## 2013-07-20 NOTE — Discharge Summary (Signed)
Physician Discharge Summary  Patient ID: Eric Rivers MRN: [id number] DOB/AGE: 1930/10/09 80 y.o.  Admit date: 07/19/2013 Discharge date: 07/21/2013  Primary Care Physician:  Cathlean Cower, MD  Discharge Diagnoses:    . Syncope . Cough   Right acute 10th rib fracture  . Hypertension . GERD (gastroesophageal reflux disease) . Atrial fibrillation on Coumadin  Alcohol abuse  Consults: None   Recommendations for Outpatient Follow-up:  Patient was strongly recommended to cut down on his alcohol  Allergies:  No Known Allergies   Discharge Medications:   Medication List    STOP taking these medications       metoprolol tartrate 25 MG tablet  Commonly known as:  LOPRESSOR      TAKE these medications       acetaminophen 500 MG tablet  Commonly known as:  TYLENOL  Take 1,000 mg by mouth 3 (three) times daily.     allopurinol 300 MG tablet  Commonly known as:  ZYLOPRIM  Take 300 mg by mouth daily.     amLODipine 2.5 MG tablet  Commonly known as:  NORVASC  Take 1 tablet (2.5 mg total) by mouth daily.     aspirin 325 MG tablet  Take 325 mg by mouth daily.     atorvastatin 20 MG tablet  Commonly known as:  LIPITOR  Take 20 mg by mouth every morning.     benzonatate 100 MG capsule  Commonly known as:  TESSALON  Take 1 capsule (100 mg total) by mouth 3 (three) times daily as needed for cough.     CALCIUM 600 + D PO  Take 1 tablet by mouth every morning.     chlorpheniramine-HYDROcodone 10-8 MG/5ML Lqcr  Commonly known as:  TUSSIONEX  Take 5 mLs by mouth every 12 (twelve) hours as needed for cough.     desonide 0.05 % lotion  Commonly known as:  DESOWEN  Apply 1 application topically daily as needed (for face).     finasteride 5 MG tablet  Commonly known as:  PROSCAR  Take 5 mg by mouth every morning.     furosemide 40 MG tablet  Commonly known as:  LASIX  Take 1 tablet (40 mg total) by mouth every morning.     ketoconazole 2 % shampoo  Commonly  known as:  NIZORAL  Apply 1 application topically 4 (four) times a week.     ONCOVITE PO  Take 1 tablet by mouth daily.     pantoprazole 40 MG tablet  Commonly known as:  PROTONIX  Take 40 mg by mouth daily.     potassium chloride 10 MEQ tablet  Commonly known as:  K-DUR,KLOR-CON  Take 10 mEq by mouth daily.     SENOKOT 8.6 MG tablet  Generic drug:  senna  Take 1 tablet by mouth as needed for constipation.     sitaGLIPtin 100 MG tablet  Commonly known as:  JANUVIA  Take 100 mg by mouth daily.     SLEEP AID PO  Take 1 tablet by mouth as needed (for sleep).     tamsulosin 0.4 MG Caps capsule  Commonly known as:  FLOMAX  Take 0.4 mg by mouth at bedtime.     traMADol 50 MG tablet  Commonly known as:  ULTRAM  Take 1 tablet (50 mg total) by mouth every 6 (six) hours as needed for moderate pain.     traZODone 25 mg Tabs tablet  Commonly known as:  DESYREL  Take 0.5 tablets (  25 mg total) by mouth at bedtime.     warfarin 4 MG tablet  Commonly known as:  COUMADIN  Take 4 mg by mouth every morning.         Brief H and P: For complete details please refer to admission H and P, but in briefThis is a 80 y.o. year old male with multiple medical problems including chronic A. fib on Coumadin, aortic stenosis, pulmonary hypertension, reflux, type 2 diabetes, a sending aortic dissection presented with syncope. Patient reported that he had 3 large drinks liquor last night over the course of about 3 hours. Patient stated that he woke up in the morning underneath his recliner and being unable to get out. Patient states that he slowly worked his way out of a chair over the course of the morning and then proceed to call EMS around 4 AM of the morning of admission Patient was initially recommended to go to the ER at that time but patient refused. Subsequently then he took a nap for the rest of the morning. Patient woke up later in the day with chest wall pain with pain it was worse with deep  breathing coughing or movement. Patient also had a mild abrasion over the left side. Patient went to the [facility] urgent care for further evaluation and was directed to go to the ER. Patient has no recollection the events leading to a fall. Was mildly confused when he woke up the morning, but attributes this alcohol. Pt states that he has had mechanical falls at home in the past, but usually recollects what happens. Believes incident was related to ETOH intake.  On presentation, patient hemodynamically stable. Blood pressures in the 130s to 160s. Satting well on room air. INR 2.9. Hemoglobin 10.4. Pelvic x-rays with no fracture. Head CT with chronic small vessel disease but no acute abnormality. Chest x-ray shows stable cardiomegaly and an aneurysmal dilation of the thoracic aorta compared to prior chest CT is also unchanged bibasilar opacities. No focal pneumonia. Ethanol and CK levels within normal limits. Pro BNP elevated 1583. UA negative for infection.    Hospital Course:  Syncope: Possibly due to alcohol-induced syncopal episode (patient himself reports he had one- too many vodkas as he was stressed out, heat pump system was not working in the house), however neurocardiogenic sources also in differential given his strong cardiac history. UA showed 15 ketones  MRI of the brain negative for any stroke , cardiac enzymes negative so far. Patient was admitted to telemetry and showed bradycardia with heart rate in 40s while sleeping and sinus pauses.  Metoprolol was discontinued. 2-D echo showed EF 55-60%, normal wall motion, moderate tricuspid regurgitation Physical therapy evaluation showed no PT follow up needed Patient has a followup appointment with Dr. Ron Parker on 08/01/2013  Acute right 10th rib fracture: Likely due to the fall, patient was provided pain medication and incentive spirometry recommended.  Hypertension - Currently stable   Atrial fibrillation - Currently rate controlled, on Coumadin.  Please note that metoprolol has been discontinued due to sinus bradycardia and pauses.   Alcohol abuse; - Patient counseled on quitting alcohol, placed on CIWA protocol   Diabetes mellitus  - Patient refused sliding scale insulin, placed him on oral hypoglycemics per his home regimen       Day of Discharge BP 155/72  Pulse 69  Temp(Src) 98.7 F (37.1 C) (Oral)  Resp 18  Ht 5' 7.5" (1.715 m)  Wt 105.2 kg (231 lb 14.8 oz)  BMI  35.77 kg/m2  SpO2 100%  Physical Exam: General: Alert and awake oriented x3 not in any acute distress. HEENT: anicteric sclera, pupils reactive to light and accommodation CVS: S1-S2 clear no murmur rubs or gallops Chest: clear to auscultation bilaterally, no wheezing rales or rhonchi Abdomen: soft nontender, nondistended, normal bowel sounds Extremities: no cyanosis, clubbing or edema noted bilaterally Neuro: Cranial nerves II-XII intact, no focal neurological deficits   The results of significant diagnostics from this hospitalization (including imaging, microbiology, ancillary and laboratory) are listed below for reference.    LAB RESULTS: Basic Metabolic Panel:  Recent Labs Lab 07/20/13 0545 07/21/13 0731  NA 143 144  K 3.7 4.2  CL 106 105  CO2 24 27  GLUCOSE 109* 120*  BUN 10 8  CREATININE 0.73 0.69  CALCIUM 8.5 9.1   Liver Function Tests:  Recent Labs Lab 07/20/13 0545 07/21/13 0731  AST 25 24  ALT 15 17  ALKPHOS 86 95  BILITOT 0.9 1.1  PROT 6.4 7.0  ALBUMIN 3.1* 3.4*   No results found for this basename: LIPASE, AMYLASE,  in the last 168 hours  Recent Labs Lab 07/19/13 2326  AMMONIA 18   CBC:  Recent Labs Lab 07/20/13 0545 07/21/13 0731  WBC 6.2 7.6  NEUTROABS 4.3 5.7  HGB 9.9* 10.1*  HCT 31.4* 32.8*  MCV 88.7 89.6  PLT 175 199   Cardiac Enzymes:  Recent Labs Lab 07/19/13 1833  07/20/13 0545 07/20/13 1055  CKTOTAL 143  --  109  --   TROPONINI <0.30  < > <0.30 <0.30  < > = values in this interval not  displayed. BNP: No components found with this basename: POCBNP,  CBG:  Recent Labs Lab 07/20/13 2207 07/21/13 0810  GLUCAP 105* 133*    Significant Diagnostic Studies:  Dg Chest 2 View  07/19/2013   CLINICAL DATA:  Chest wall pain  EXAM: CHEST  2 VIEW  COMPARISON:  DG CHEST 2 VIEW dated 05/26/2012; DG CHEST 2 VIEW dated 05/11/2012; CT ANGIO CHEST AORTA W/CM \\T \/OR WO/CM dated 11/24/2012; DG CHEST 1V PORT dated 04/19/2012  FINDINGS: Grossly unchanged enlarged cardiac silhouette and mediastinal contours post median sternotomy. There is persistent aneurysmal dilatation of the thoracic aorta. There is persistent thickening of the right paratracheal stripe secondary to prominent vasculature. Grossly unchanged bibasilar heterogeneous opacities, left greater than right favored to represent atelectasis or scar. No new focal airspace opacities. No pleural effusion or pneumothorax. No evidence of edema. No acute osseus abnormalities, specifically, no definite displaced rib fractures. Surgical clips again overlie the right axilla.  IMPRESSION: 1.  No acute cardiopulmonary disease. 2. Grossly unchanged findings of cardiomegaly and aneurysmal dilatation of the thoracic aorta compatible with known thoracic aortic dissection as demonstrated on prior chest CT. 3. Grossly unchanged bibasilar heterogeneous opacities, left greater than right, likely atelectasis or scar.   Electronically Signed   By: Sandi Mariscal M.D.   On: 07/19/2013 16:52   Dg Ribs Unilateral Left  07/20/2013   CLINICAL DATA:  Shortness of breath, fall  EXAM: LEFT RIBS - 2 VIEW  COMPARISON:  None.  FINDINGS: No fracture or other bone lesions are seen involving the ribs. Cardiomegaly and evidence of CABG noted. Curvilinear calcification over the left upper quadrant most likely indicates splenic arterial vascular calcification. Minimal angulation of the left lateral sixth rib is seen on only one view and is felt most likely be projectional.  IMPRESSION:  Negative.   Electronically Signed   By: Roena Malady.D.  On: 07/20/2013 14:46   Dg Ribs Unilateral Right  07/20/2013   CLINICAL DATA:  Shortness of breath and right-sided chest wall pain status post fall 2 days ago.  EXAM: RIGHT RIBS -4 VIEW  COMPARISON:  DG CHEST 2V dated 07/19/2013  FINDINGS: The visualized ribs are adequately mineralized. There is minimal deformity of the anterior aspect of the right tenth rib just proximal to the costochondral junction consistent with an acute fracture. There is no pleural effusion or pneumothorax.  IMPRESSION: Anterior right tenth rib fracture.   Electronically Signed   By: David  Martinique   On: 07/20/2013 13:01   Dg Pelvis 1-2 Views  07/19/2013   CLINICAL DATA:  Tail bone pain, fell yesterday  EXAM: PELVIS - 1-2 VIEW  COMPARISON:  CT pelvis 11/24/2012  FINDINGS: Diffuse osseous demineralization.  Hip and SI joints symmetric and preserved.  Lateral margin of proximal RIGHT femur excluded.  No acute fracture, dislocation, or bone destruction.  Multiple pelvic phleboliths and scattered atherosclerotic calcifications.  Degenerative disc disease changes L4-L5.  IMPRESSION: Osseous demineralization without acute bony findings.   Electronically Signed   By: Lavonia Dana M.D.   On: 07/19/2013 20:11   Ct Head Wo Contrast  07/19/2013   CLINICAL DATA:  Fall with abrasions to the LEFT supraorbital region  EXAM: CT HEAD WITHOUT CONTRAST  TECHNIQUE: Contiguous axial images were obtained from the base of the skull through the vertex without intravenous contrast.  COMPARISON:  None  FINDINGS: Generalized atrophy.  Normal ventricular morphology.  No midline shift or mass effect.  Small vessel chronic ischemic changes of deep cerebral white matter.  No intracranial hemorrhage, mass lesion, or acute infarction.  Visualized paranasal sinuses and mastoid air cells clear.  Bones unremarkable.  Small LEFT supraorbital scalp hematoma.  Atherosclerotic calcifications in vertebral and  carotid arteries at skullbase.  IMPRESSION: Atrophy with small vessel chronic ischemic changes of deep cerebral white matter.  No acute intracranial abnormalities.   Electronically Signed   By: Lavonia Dana M.D.   On: 07/19/2013 19:30   Mr Brain Wo Contrast  07/20/2013   CLINICAL DATA:  80 year old male with syncope. Initial encounter.  EXAM: MRI HEAD WITHOUT CONTRAST  TECHNIQUE: Multiplanar, multiecho pulse sequences of the brain and surrounding structures were obtained without intravenous contrast.  COMPARISON:  Head CT without contrast 07/1913.  FINDINGS: Study is intermittently degraded by motion artifact despite repeated imaging attempts.  Cerebral volume is within normal limits for age. No restricted diffusion to suggest acute infarction. No midline shift, mass effect, evidence of mass lesion, ventriculomegaly, extra-axial collection or acute intracranial hemorrhage. Cervicomedullary junction and pituitary are within normal limits. Negative visualized cervical spine. Normal bone marrow signal.  Major intracranial vascular flow voids are preserved.  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 No other cortical encephalomalacia. Mild for age patchy and scattered cerebral white matter T2 and FLAIR hyperintensity, nonspecific. Deep gray matter nuclei, brainstem and cerebellum are within normal limits for age. Visible internal auditory structures appear normal.  Mastoids are clear. Visualized orbit soft tissues are within normal limits. Paranasal sinuses are clear. Chronic appearing scalp soft tissue defect along the left superior vertex (series 11, image 13), probably soft tissue scarring. Negative scalp soft tissues otherwise.  IMPRESSION: 1.  No acute intracranial abnormality. 2. Mild for age chronic ischemic changes.   Electronically Signed   By: Lars Pinks M.D.   On: 07/20/2013 10:46  2D ECHO:  Study Conclusions  - Left ventricle: The  cavity size was normal. Wall thickness was normal. Systolic function was normal. The estimated ejection fraction was in the range of 55% to 60%. Wall motion was normal; there were no regional wall motion abnormalities. The study is not technically sufficient to allow evaluation of LV diastolic function. Doppler parameters are consistent with elevated mean left atrial filling pressure. - Aortic valve: There was mild stenosis. Valve area (VTI): 1.25 cm^2. Valve area (Vmax): 1.28 cm^2. - Left atrium: The atrium was mildly dilated. - Right ventricle: The cavity size was moderately dilated. Systolic function was moderately reduced. - Right atrium: The atrium was severely dilated. - Tricuspid valve: There was moderate regurgitation directed centrally. - Pulmonary arteries: Systolic pressure was moderately to severely increased. PA peak pressure: 62 mm Hg (S).  Impressions:  - Estimated sPAP has increased since 2014.    Disposition and Follow-up: Discharge Instructions   Diet - low sodium heart healthy    Complete by:  As directed      Increase activity slowly    Complete by:  As directed             DISPOSITION: Home   DIET: Carb modified diet    DISCHARGE FOLLOW-UP Follow-up Information   Follow up with Cathlean Cower, MD On 07/22/2013. (At 10:30 AM)    Specialties:  Internal Medicine, Radiology   Contact information:   [address] [place] [address] [phone]       Follow up with Dola Argyle, MD On 08/01/2013. (At 2:30 PM)    Specialty:  Cardiology   Contact information:   [id number] [address] [phone]       Time spent on Discharge: 40 minutes  Signed:   Mendel Corning M.D. [facility] 07/21/2013, 10:47 AM Pager: [phone]   **Disclaimer: This note was dictated with voice recognition software. Similar sounding words can inadvertently be transcribed and this note may contain transcription errors  which may not have been corrected upon publication of note.**

## 2013-07-21 ENCOUNTER — Ambulatory Visit: Payer: Medicare Other

## 2013-07-21 DIAGNOSIS — I509 Heart failure, unspecified: Secondary | ICD-10-CM

## 2013-07-21 DIAGNOSIS — I359 Nonrheumatic aortic valve disorder, unspecified: Secondary | ICD-10-CM | POA: Diagnosis not present

## 2013-07-21 DIAGNOSIS — I4891 Unspecified atrial fibrillation: Secondary | ICD-10-CM

## 2013-07-21 DIAGNOSIS — R55 Syncope and collapse: Secondary | ICD-10-CM | POA: Diagnosis not present

## 2013-07-21 DIAGNOSIS — IMO0002 Reserved for concepts with insufficient information to code with codable children: Secondary | ICD-10-CM | POA: Diagnosis not present

## 2013-07-21 DIAGNOSIS — I5042 Chronic combined systolic (congestive) and diastolic (congestive) heart failure: Secondary | ICD-10-CM

## 2013-07-21 DIAGNOSIS — Z7189 Other specified counseling: Secondary | ICD-10-CM | POA: Diagnosis not present

## 2013-07-21 LAB — CBC WITH DIFFERENTIAL/PLATELET
BASOS PCT: 0 % (ref 0–1)
Basophils Absolute: 0 10*3/uL (ref 0.0–0.1)
EOS ABS: 0.4 10*3/uL (ref 0.0–0.7)
EOS PCT: 5 % (ref 0–5)
HCT: 32.8 % — ABNORMAL LOW (ref 39.0–52.0)
Hemoglobin: 10.1 g/dL — ABNORMAL LOW (ref 13.0–17.0)
LYMPHS ABS: 0.9 10*3/uL (ref 0.7–4.0)
Lymphocytes Relative: 12 % (ref 12–46)
MCH: 27.6 pg (ref 26.0–34.0)
MCHC: 30.8 g/dL (ref 30.0–36.0)
MCV: 89.6 fL (ref 78.0–100.0)
Monocytes Absolute: 0.6 10*3/uL (ref 0.1–1.0)
Monocytes Relative: 8 % (ref 3–12)
Neutro Abs: 5.7 10*3/uL (ref 1.7–7.7)
Neutrophils Relative %: 75 % (ref 43–77)
PLATELETS: 199 10*3/uL (ref 150–400)
RBC: 3.66 MIL/uL — ABNORMAL LOW (ref 4.22–5.81)
RDW: 17.2 % — ABNORMAL HIGH (ref 11.5–15.5)
WBC: 7.6 10*3/uL (ref 4.0–10.5)

## 2013-07-21 LAB — COMPREHENSIVE METABOLIC PANEL
ALBUMIN: 3.4 g/dL — AB (ref 3.5–5.2)
ALT: 17 U/L (ref 0–53)
AST: 24 U/L (ref 0–37)
Alkaline Phosphatase: 95 U/L (ref 39–117)
BUN: 8 mg/dL (ref 6–23)
CO2: 27 mEq/L (ref 19–32)
Calcium: 9.1 mg/dL (ref 8.4–10.5)
Chloride: 105 mEq/L (ref 96–112)
Creatinine, Ser: 0.69 mg/dL (ref 0.50–1.35)
GFR calc Af Amer: >90 mL/min (ref >90)
GFR calc non Af Amer: 87 mL/min — ABNORMAL LOW (ref >90)
Glucose, Bld: 120 mg/dL — ABNORMAL HIGH (ref 70–99)
POTASSIUM: 4.2 meq/L (ref 3.7–5.3)
Sodium: 144 mEq/L (ref 137–147)
TOTAL PROTEIN: 7 g/dL (ref 6.0–8.3)
Total Bilirubin: 1.1 mg/dL (ref 0.3–1.2)

## 2013-07-21 LAB — PROTIME-INR
INR: 2.86 — AB (ref 0.00–1.49)
Prothrombin Time: 29 seconds — ABNORMAL HIGH (ref 11.6–15.2)

## 2013-07-21 LAB — GLUCOSE, CAPILLARY: Glucose-Capillary: 133 mg/dL — ABNORMAL HIGH (ref 70–99)

## 2013-07-21 MED ORDER — BENZONATATE 100 MG PO CAPS: 100.0000 mg | ORAL_CAPSULE | Freq: Three times a day (TID) | ORAL | Status: DC | PRN

## 2013-07-21 MED ORDER — TRAMADOL HCL 50 MG PO TABS: 50.0000 mg | ORAL_TABLET | Freq: Four times a day (QID) | ORAL | Status: DC | PRN

## 2013-07-21 MED ORDER — WARFARIN SODIUM 4 MG PO TABS
4.0000 mg | ORAL_TABLET | Freq: Every day | ORAL | Status: DC
  Filled 2013-07-21: qty 1

## 2013-07-21 MED ORDER — HYDROCOD POLST-CHLORPHEN POLST 10-8 MG/5ML PO LQCR: 5.0000 mL | Freq: Two times a day (BID) | ORAL | Status: DC | PRN

## 2013-07-21 NOTE — Progress Notes (Signed)
Pt d/c'd via wheelchair with belongings, escorted by unit NT.

## 2013-07-21 NOTE — Progress Notes (Signed)
D/c instructions reviewed with pt, copy of instructions and scripts given to pt. Pt waiting for ride to come, ride will come to main entrance A, [employer]	[place].

## 2013-07-21 NOTE — Discharge Instructions (Signed)
Information on my medicine - Coumadin   (Warfarin)  This medication education was reviewed with me or my healthcare representative as part of my discharge preparation.  The pharmacist that spoke with me during my hospital stay was:  Brain Hilts, [facility]  Why was Coumadin prescribed for you? Coumadin was prescribed for you because you have a blood clot or a medical condition that can cause an increased risk of forming blood clots. Blood clots can cause serious health problems by blocking the flow of blood to the heart, lung, or brain. Coumadin can prevent harmful blood clots from forming. As a reminder your indication for Coumadin is:   Stroke Prevention Because Of Atrial Fibrillation   What test will check on my response to Coumadin? While on Coumadin (warfarin) you will need to have an INR test regularly to ensure that your dose is keeping you in the desired range. The INR (international normalized ratio) number is calculated from the result of the laboratory test called prothrombin time (PT).  If an INR APPOINTMENT HAS NOT ALREADY BEEN MADE FOR YOU please schedule an appointment to have this lab work done by your health care provider within 7 days. Your INR goal is usually a number between:  2 to 3 or your provider may give you a more narrow range like 2-2.5.  Ask your health care provider during an office visit what your goal INR is.  What  do you need to  know  About  COUMADIN? Take Coumadin (warfarin) exactly as prescribed by your healthcare provider about the same time each day.  DO NOT stop taking without talking to the doctor who prescribed the medication.  Stopping without other blood clot prevention medication to take the place of Coumadin may increase your risk of developing a new clot or stroke.  Get refills before you run out.  What do you do if you miss a dose? If you miss a dose, take it as soon as you remember on the same day then continue your regularly scheduled regimen the  next day.  Do not take two doses of Coumadin at the same time.  Important Safety Information A possible side effect of Coumadin (Warfarin) is an increased risk of bleeding. You should call your healthcare provider right away if you experience any of the following:   Bleeding from an injury or your nose that does not stop.   Unusual colored urine (red or dark brown) or unusual colored stools (red or black).   Unusual bruising for unknown reasons.   A serious fall or if you hit your head (even if there is no bleeding).  Some foods or medicines interact with Coumadin (warfarin) and might alter your response to warfarin. To help avoid this:   Eat a balanced diet, maintaining a consistent amount of Vitamin K.   Notify your provider about major diet changes you plan to make.   Avoid alcohol or limit your intake to 1 drink for women and 2 drinks for men per day. (1 drink is 5 oz. wine, 12 oz. beer, or 1.5 oz. liquor.)  Make sure that ANY health care provider who prescribes medication for you knows that you are taking Coumadin (warfarin).  Also make sure the healthcare provider who is monitoring your Coumadin knows when you have started a new medication including herbals and non-prescription products.  Coumadin (Warfarin)  Major Drug Interactions  Increased Warfarin Effect Decreased Warfarin Effect  Alcohol (large quantities) Antibiotics (esp. Septra/Bactrim, Flagyl, Cipro) Amiodarone (Cordarone)  Aspirin (ASA) Cimetidine (Tagamet) Megestrol (Megace) NSAIDs (ibuprofen, naproxen, etc.) Piroxicam (Feldene) Propafenone (Rythmol SR) Propranolol (Inderal) Isoniazid (INH) Posaconazole (Noxafil) Barbiturates (Phenobarbital) Carbamazepine (Tegretol) Chlordiazepoxide (Librium) Cholestyramine (Questran) Griseofulvin Oral Contraceptives Rifampin Sucralfate (Carafate) Vitamin K   Coumadin (Warfarin) Major Herbal Interactions  Increased Warfarin Effect Decreased Warfarin Effect   Garlic Ginseng Ginkgo biloba Coenzyme Q10 Green tea St. Johns wort    Coumadin (Warfarin) FOOD Interactions  Eat a consistent number of servings per week of foods HIGH in Vitamin K (1 serving =  cup)  Collards (cooked, or boiled & drained) Kale (cooked, or boiled & drained) Mustard greens (cooked, or boiled & drained) Parsley *serving size only =  cup Spinach (cooked, or boiled & drained) Swiss chard (cooked, or boiled & drained) Turnip greens (cooked, or boiled & drained)  Eat a consistent number of servings per week of foods MEDIUM-HIGH in Vitamin K (1 serving = 1 cup)  Asparagus (cooked, or boiled & drained) Broccoli (cooked, boiled & drained, or raw & chopped) Brussel sprouts (cooked, or boiled & drained) *serving size only =  cup Lettuce, raw (green leaf, endive, romaine) Spinach, raw Turnip greens, raw & chopped   These websites have more information on Coumadin (warfarin):  FailFactory.se; VeganReport.com.au;

## 2013-07-21 NOTE — Progress Notes (Signed)
ANTICOAGULATION CONSULT NOTE - Follow Up Consult  Pharmacy Consult for Coumadin Indication: atrial fibrillation  No Known Allergies Patient Measurements: Height: 5' 7.5" (171.5 cm) Weight: 231 lb 14.8 oz (105.2 kg) IBW/kg (Calculated) : 67.25 Labs:  Recent Labs  07/19/13 1521  07/19/13 1833 07/19/13 2326 07/20/13 0545 07/20/13 1055 07/21/13 0731  HGB  --   < > 10.4*  --  9.9*  --  10.1*  HCT  --   < > 32.9*  --  31.4*  --  32.8*  PLT  --   --  200  --  175  --  199  LABPROT 29.5*  --  29.8*  --  32.7*  --  29.0*  INR 2.90*  --  2.96*  --  3.35*  --  2.86*  CREATININE  --   --  0.77  --  0.73  --  0.69  CKTOTAL  --   --  143  --  109  --   --   TROPONINI  --   --  <0.30 <0.30 <0.30 <0.30  --   < > = values in this interval not displayed.  Estimated Creatinine Clearance: 84.5 ml/min (by C-G formula based on Cr of 0.69).  Assessment: 42 YOM on chronic Coumadin for atrial fibrillation. INR was SUPRA-therapeutic on admission, likely secondary to alcohol binge per patient report. INR today is back in therapeutic range at 2.86 after holding Coumadin x1 day. H/H is low-stable. Platelets are 199. Will educate patient on importance of avoiding alcohol if possible or limiting intake while on Coumadin.   Goal of Therapy:  INR 2-3 Monitor platelets by anticoagulation protocol: Yes   Plan:  Resume home dose of warfarin 4mg  po daily. Recommend INR check within the week.   Sloan Leiter, PharmD, BCPS Clinical Pharmacist [phone] 07/21/2013,9:44 AM

## 2013-07-22 ENCOUNTER — Ambulatory Visit (INDEPENDENT_AMBULATORY_CARE_PROVIDER_SITE_OTHER): Payer: Medicare Other | Admitting: Internal Medicine

## 2013-07-22 ENCOUNTER — Encounter: Payer: Self-pay | Admitting: Internal Medicine

## 2013-07-22 VITALS — BP 132/82 | HR 64 | Temp 97.5°F | Wt 230.4 lb

## 2013-07-22 DIAGNOSIS — I10 Essential (primary) hypertension: Secondary | ICD-10-CM | POA: Diagnosis not present

## 2013-07-22 DIAGNOSIS — G4700 Insomnia, unspecified: Secondary | ICD-10-CM

## 2013-07-22 DIAGNOSIS — E1142 Type 2 diabetes mellitus with diabetic polyneuropathy: Secondary | ICD-10-CM

## 2013-07-22 DIAGNOSIS — E785 Hyperlipidemia, unspecified: Secondary | ICD-10-CM | POA: Diagnosis not present

## 2013-07-22 DIAGNOSIS — E1149 Type 2 diabetes mellitus with other diabetic neurological complication: Secondary | ICD-10-CM | POA: Diagnosis not present

## 2013-07-22 DIAGNOSIS — E1140 Type 2 diabetes mellitus with diabetic neuropathy, unspecified: Secondary | ICD-10-CM

## 2013-07-22 MED ORDER — TRAZODONE HCL 50 MG PO TABS: 25.0000 mg | ORAL_TABLET | Freq: Every evening | ORAL | Status: DC | PRN

## 2013-07-22 NOTE — Progress Notes (Signed)
Pre visit review using our clinic review tool, if applicable. No additional management support is needed unless otherwise documented below in the visit note. 

## 2013-07-22 NOTE — Assessment & Plan Note (Signed)
stable overall by history and exam, recent data reviewed with pt, and pt to continue medical treatment as before,  to f/u any worsening symptoms or concerns BP Readings from Last 3 Encounters:  07/22/13 132/82  07/21/13 155/72  07/19/13 132/71

## 2013-07-22 NOTE — Assessment & Plan Note (Signed)
stable overall by history and exam, recent data reviewed with pt, and pt to continue medical treatment as before,  to f/u any worsening symptoms or concerns Lab Results  Component Value Date   HGBA1C 6.9* 07/19/2013    

## 2013-07-22 NOTE — Assessment & Plan Note (Signed)
stable overall by history and exam, recent data reviewed with pt, and pt to continue medical treatment as before,  to f/u any worsening symptoms or concerns Lab Results  Component Value Date   LDLCALC 45 09/07/2012

## 2013-07-22 NOTE — Progress Notes (Signed)
Subjective:    Patient ID: Eric Rivers, male    DOB: February 18, 1931, 80 y.o.   MRN: [id number]  HPI  Here to f/u in transfer as new pt from Dr Linda Hedges who retired, incidentally just [facility] after episode of syncope and fall he does not recall, but attributes to increased use of ETOH at the time. Sustained right rib fx, and Mult bruises to left eyebrow and other   Had several drinks the night of the fall. Pt denies chest pain, increased sob or doe, wheezing, orthopnea, PND, increased LE swelling, palpitations, dizziness or syncope since d/c.  Pt denies new neurological symptoms such as new headache, or facial or extremity weakness or numbness   Pt denies polydipsia, polyuria, or low sugar symptoms such as weakness or confusion improved with po intake.  Pt states overall good compliance with meds, trying to follow lower cholesterol, diabetic diet, wt overall stable. Needs increased trazodone for sleep as low dose 25 mg not helpful.  Pt denies fever, wt loss, night sweats, loss of appetite, or other constitutional symptoms Past Medical History  Diagnosis Date  . Whooping cough   . Atrial fibrillation      Chronic,   24 hour holter, September, 2011.... atrial fib rate is controlled.... there is some bradycardia but  no marked pauses  . Diabetes mellitus     type 2  . Hypertension   . DDD (degenerative disc disease)     lumbar spine  . GERD (gastroesophageal reflux disease)   . Gout   . Fluid overload   . ACE-inhibitor cough   . Shortness of breath     on exertion  . Aortic stenosis     mild....echo... september... 2010/ mild... echo.[name] KitchenMarland KitchenDec, 2011  . Pulmonary hypertension     71mmHg, echo, 01/2010  . Hyperlipidemia   . Hx of colonoscopy     approx. 10 years  . Warfarin anticoagulation     Atrial fib  . Ejection fraction     EF 60%, echo, 01/2010  . Normal nuclear stress test     06/2005 , also ABI normal 2007  . Hypercholesterolemia   . Fracture of metacarpal bone  11/30/11  . Aortic dissection     Surgical repair February, 2014  . Mitral regurgitation   . Bladder cancer     recurrence with TUR-B March '09  . Prostate cancer     radiation tx.  . Diabetes mellitus with neuropathy 12/04/2006    Qualifier: Diagnosis of  By: Linda Hedges MD, Heinz Knuckles  medications - DPP4    Past Surgical History  Procedure Laterality Date  . Lumbar laminectomy      '02  . Tur-bt '06, '09    . Bladder cancer biopsy  10/16/2010    negative for malignancy  . Bladder microscopic  04/13/2007    high grade papillary urothelial lesions  . 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 Prostate biopsy  09/25/2011    GLEASON 3+3=6 AND 4+3=7  . Ascending aortic root replacement N/A 04/15/2012    Procedure: ASCENDING AORTIC ROOT REPLACEMENT;  Surgeon: Gaye Pollack, MD;  Location: [facility];  Service: Open Heart Surgery;  Laterality: N/A;  . Tee without cardioversion N/A 04/15/2012    Procedure: TRANSESOPHAGEAL ECHOCARDIOGRAM (TEE);  Surgeon: Gaye Pollack, MD;  Location: [facility];  Service: Open Heart Surgery;  Laterality: N/A;  . Exploration post operative open heart  04/2012  .  Cystoscopy/retrograde/ureteroscopy Bilateral 04/04/2013    Procedure: CYSTOSCOPY WITH RETROGRADE PYELOGRAM AND BLADDER BIOPSY;  Surgeon: Molli Hazard, MD;  Location: [facility];  Service: Urology;  Laterality: Bilateral;  . Herniatic disc surgery      reports that he quit smoking about 38 years ago. His smoking use included Cigarettes. He smoked 1.00 pack per day. He has never used smokeless tobacco. He reports that he drinks about 7 ounces of alcohol per week. He reports that he does not use illicit drugs. family history includes Cancer in his father; Heart disease in his mother; Hypertension in his mother; Prostate cancer (age of onset: 32) in his father; Stroke in his father. No Known Allergies Current Outpatient Prescriptions on File Prior to Visit  Medication Sig Dispense  Refill  . acetaminophen (TYLENOL) 500 MG tablet Take 1,000 mg by mouth 3 (three) times daily.      [name] Kitchen allopurinol (ZYLOPRIM) 300 MG tablet Take 300 mg by mouth daily.      [name] Kitchen amLODipine (NORVASC) 2.5 MG tablet Take 1 tablet (2.5 mg total) by mouth daily.  90 tablet  3  . aspirin 325 MG tablet Take 325 mg by mouth daily.      [name] Kitchen atorvastatin (LIPITOR) 20 MG tablet Take 20 mg by mouth every morning.      . benzonatate (TESSALON) 100 MG capsule Take 1 capsule (100 mg total) by mouth 3 (three) times daily as needed for cough.  30 capsule  0  . Calcium Carb-Cholecalciferol (CALCIUM 600 + D PO) Take 1 tablet by mouth every morning.      . chlorpheniramine-HYDROcodone (TUSSIONEX) 10-8 MG/5ML LQCR Take 5 mLs by mouth every 12 (twelve) hours as needed for cough.  115 mL  0  . desonide (DESOWEN) 0.05 % lotion Apply 1 application topically daily as needed (for face).      . Doxylamine Succinate, Sleep, (SLEEP AID PO) Take 1 tablet by mouth as needed (for sleep).       . finasteride (PROSCAR) 5 MG tablet Take 5 mg by mouth every morning.       . furosemide (LASIX) 40 MG tablet Take 1 tablet (40 mg total) by mouth every morning.  30 tablet  3  . ketoconazole (NIZORAL) 2 % shampoo Apply 1 application topically 4 (four) times a week.      . Multiple Vitamins-Minerals (ONCOVITE PO) Take 1 tablet by mouth daily.       . pantoprazole (PROTONIX) 40 MG tablet Take 40 mg by mouth daily.      . potassium chloride (K-DUR,KLOR-CON) 10 MEQ tablet Take 10 mEq by mouth daily.      [name] Kitchen senna (SENOKOT) 8.6 MG tablet Take 1 tablet by mouth as needed for constipation.       . sitaGLIPtin (JANUVIA) 100 MG tablet Take 100 mg by mouth daily.      . Tamsulosin HCl (FLOMAX) 0.4 MG CAPS Take 0.4 mg by mouth at bedtime.       . traMADol (ULTRAM) 50 MG tablet Take 1 tablet (50 mg total) by mouth every 6 (six) hours as needed for moderate pain.  45 tablet  1  . warfarin (COUMADIN) 4 MG tablet Take 4 mg by mouth every morning.       No  current facility-administered medications on file prior to visit.     Review of Systems  Constitutional: Negative for unusual diaphoresis or other sweats  HENT: Negative for ringing in ear Eyes: Negative for double vision or worsening  visual disturbance.  Respiratory: Negative for choking and stridor.   Gastrointestinal: Negative for vomiting or other signifcant bowel change Genitourinary: Negative for hematuria or decreased urine volume.  Musculoskeletal: Negative for other MSK pain or swelling Skin: Negative for color change and worsening wound.  Neurological: Negative for tremors and numbness other than noted  Psychiatric/Behavioral: Negative for decreased concentration or agitation other than above       Objective:   Physical Exam BP 132/82  Pulse 64  Temp(Src) 97.5 F (36.4 C) (Oral)  Wt 230 lb 6 oz (104.497 kg)  SpO2 93% VS noted, not ill appearing Constitutional: Pt appears well-developed, well-nourished.  HENT: Head: NCAT.  Right Ear: External ear normal.  Left Ear: External ear normal.  Eyes: . Pupils are equal, round, and reactive to light. Conjunctivae and EOM are normal Neck: Normal range of motion. Neck supple.  Cardiovascular: Normal rate and regular rhythm.   Pulmonary/Chest: Effort normal and breath sounds normal.  Abd:  Soft, NT, ND, + BS Neurological: Pt is alert. Not confused , motor grossly intact Skin: Skin is warm. No rash Psychiatric: Pt behavior is normal. No agitation.     Assessment & Plan:

## 2013-07-22 NOTE — Patient Instructions (Addendum)
Please take all new medication as prescribed - the higher doses trazodone (sent to your pharmacy)  Please continue all other medications as before, and refills have been done if requested. Please have the pharmacy call with any other refills you may need.  Please continue your efforts at being more active, low cholesterol diet, and weight control.  No further lab work is needed today  Please return in 3 months, or sooner if needed

## 2013-07-22 NOTE — Assessment & Plan Note (Signed)
Ok for incr trazodone 50 qhs prn

## 2013-07-26 ENCOUNTER — Telehealth: Payer: Self-pay | Admitting: Internal Medicine

## 2013-07-26 NOTE — Telephone Encounter (Signed)
Relevant patient education mailed to patient.  

## 2013-07-31 ENCOUNTER — Encounter: Payer: Self-pay | Admitting: Cardiology

## 2013-08-01 ENCOUNTER — Ambulatory Visit (INDEPENDENT_AMBULATORY_CARE_PROVIDER_SITE_OTHER): Payer: Medicare Other | Admitting: Cardiology

## 2013-08-01 ENCOUNTER — Encounter: Payer: Self-pay | Admitting: Cardiology

## 2013-08-01 VITALS — BP 137/70 | HR 50 | Ht 67.0 in | Wt 223.0 lb

## 2013-08-01 DIAGNOSIS — I4891 Unspecified atrial fibrillation: Secondary | ICD-10-CM | POA: Diagnosis not present

## 2013-08-01 DIAGNOSIS — I509 Heart failure, unspecified: Secondary | ICD-10-CM

## 2013-08-01 DIAGNOSIS — R3915 Urgency of urination: Secondary | ICD-10-CM | POA: Diagnosis not present

## 2013-08-01 DIAGNOSIS — I359 Nonrheumatic aortic valve disorder, unspecified: Secondary | ICD-10-CM

## 2013-08-01 DIAGNOSIS — R55 Syncope and collapse: Secondary | ICD-10-CM | POA: Diagnosis not present

## 2013-08-01 DIAGNOSIS — I350 Nonrheumatic aortic (valve) stenosis: Secondary | ICD-10-CM

## 2013-08-01 DIAGNOSIS — C61 Malignant neoplasm of prostate: Secondary | ICD-10-CM | POA: Diagnosis not present

## 2013-08-01 DIAGNOSIS — I5042 Chronic combined systolic (congestive) and diastolic (congestive) heart failure: Secondary | ICD-10-CM | POA: Diagnosis not present

## 2013-08-01 DIAGNOSIS — Z7901 Long term (current) use of anticoagulants: Secondary | ICD-10-CM

## 2013-08-01 DIAGNOSIS — I482 Chronic atrial fibrillation, unspecified: Secondary | ICD-10-CM

## 2013-08-01 DIAGNOSIS — Z8551 Personal history of malignant neoplasm of bladder: Secondary | ICD-10-CM | POA: Diagnosis not present

## 2013-08-01 NOTE — Assessment & Plan Note (Signed)
Atrial fibrillation rate is controlled. No change in therapy. 

## 2013-08-01 NOTE — Patient Instructions (Signed)
**Note De-identified Eric Rivers Obfuscation** Your physician recommends that you continue on your current medications as directed. Please refer to the Current Medication list given to you today.  Your physician recommends that you schedule a follow-up appointment in: 3 months  

## 2013-08-01 NOTE — Assessment & Plan Note (Signed)
He continues on Coumadin. Fortunately he did not have a major bleed when falling recently while on Coumadin.

## 2013-08-01 NOTE — Assessment & Plan Note (Signed)
His most recent echo in the hospital look good. His volume status is stable.

## 2013-08-01 NOTE — Progress Notes (Signed)
Patient ID: Eric Rivers, male   DOB: 1930/09/03, 80 y.o.   MRN: [id number]    HPI  Patient is seen today as a post hospital visit and to followup his diastolic CHF. Earlier this month he fell in his home hitting his head. He said that he clearly had too much alcohol to drink that night. Eventually he was in the hospital the next day. He had no CNS injuries. He did bruise her rib. He's feeling better but still has some pain on deep inspiration. Fortunately his cardiac status remained stable throughout.  No Known Allergies  Current Outpatient Prescriptions  Medication Sig Dispense Refill  . acetaminophen (TYLENOL) 500 MG tablet Take 1,000 mg by mouth 3 (three) times daily.      [name] Kitchen allopurinol (ZYLOPRIM) 300 MG tablet Take 300 mg by mouth daily.      [name] Kitchen amLODipine (NORVASC) 2.5 MG tablet Take 1 tablet (2.5 mg total) by mouth daily.  90 tablet  3  . aspirin 325 MG tablet Take 325 mg by mouth daily.      [name] Kitchen atorvastatin (LIPITOR) 20 MG tablet Take 20 mg by mouth every morning.      . benzonatate (TESSALON) 100 MG capsule Take 1 capsule (100 mg total) by mouth 3 (three) times daily as needed for cough.  30 capsule  0  . Calcium Carb-Cholecalciferol (CALCIUM 600 + D PO) Take 1 tablet by mouth every morning.      . chlorpheniramine-HYDROcodone (TUSSIONEX) 10-8 MG/5ML LQCR Take 5 mLs by mouth every 12 (twelve) hours as needed for cough.  115 mL  0  . desonide (DESOWEN) 0.05 % lotion Apply 1 application topically daily as needed (for face).      . Doxylamine Succinate, Sleep, (SLEEP AID PO) Take 1 tablet by mouth as needed (for sleep).       . finasteride (PROSCAR) 5 MG tablet Take 5 mg by mouth every morning.       . furosemide (LASIX) 40 MG tablet Take 1 tablet (40 mg total) by mouth every morning.  30 tablet  3  . ketoconazole (NIZORAL) 2 % shampoo Apply 1 application topically 4 (four) times a week.      . Multiple Vitamins-Minerals (ONCOVITE PO) Take 1 tablet by mouth daily.       . pantoprazole  (PROTONIX) 40 MG tablet Take 40 mg by mouth daily.      . potassium chloride (K-DUR,KLOR-CON) 10 MEQ tablet Take 10 mEq by mouth daily.      [name] Kitchen senna (SENOKOT) 8.6 MG tablet Take 1 tablet by mouth as needed for constipation.       . sitaGLIPtin (JANUVIA) 100 MG tablet Take 100 mg by mouth daily.      . Tamsulosin HCl (FLOMAX) 0.4 MG CAPS Take 0.4 mg by mouth at bedtime.       . traMADol (ULTRAM) 50 MG tablet Take 1 tablet (50 mg total) by mouth every 6 (six) hours as needed for moderate pain.  45 tablet  1  . traZODone (DESYREL) 50 MG tablet Take 0.5-1 tablets (25-50 mg total) by mouth at bedtime as needed for sleep.  90 tablet  1  . warfarin (COUMADIN) 4 MG tablet Take 4 mg by mouth every morning.       No current facility-administered medications for this visit.    History   Social History  . Marital Status: Single    Spouse Name: N/A    Number of Children: 0  . Years  of Education: 19   Occupational History  . history and Advice worker     retired   Social History Main Topics  . Smoking status: Former Smoker -- 1.00 packs/day    Types: Cigarettes    Quit date: 03/04/1975  . Smokeless tobacco: Never Used  . Alcohol Use: 7.0 oz/week    14 drink(s) per week     Comment: liquor daily  . Drug Use: No  . Sexual Activity: Not Currently   Other Topics Concern  . Not on file   Social History Narrative   Confirmed batchelor. Retired Education officer, museum. World traveler- Ambulance person. I- ADLS. End of life Care   No CPR, no prolonged intubation, no prolonged artificial hydration or feeding, no heroic or futile measures.       Next trip Sept [place]13 [place]      Last cruise - two cruises back to back to the [place] in January '15. May'15: [place] with stops in [place], [place], [place], [place], [place]. 15 days      Fall '15: 50 Days from [place]. [place], [place], [place], [place], [place], [place], [place].       May '15 - 15 days up the [employer] as far as [place]             Family History  Problem Relation Age of Onset  . Prostate cancer Father 36    passed with prostate ca  . Cancer Father   . Stroke Father   . Heart disease Mother   . Hypertension Mother     Past Medical History  Diagnosis Date  . Whooping cough   . Atrial fibrillation      Chronic,   24 hour holter, September, 2011.... atrial fib rate is controlled.... there is some bradycardia but  no marked pauses  . Diabetes mellitus     type 2  . Hypertension   . DDD (degenerative disc disease)     lumbar spine  . GERD (gastroesophageal reflux disease)   . Gout   . Fluid overload   . ACE-inhibitor cough   . Shortness of breath     on exertion  . Aortic stenosis     mild....echo... september... 2010/ mild... echo.[name] KitchenMarland KitchenDec, 2011  . Pulmonary hypertension     85mmHg, echo, 01/2010  . Hyperlipidemia   . Hx of colonoscopy     approx. 10 years  . Warfarin anticoagulation     Atrial fib  . Ejection fraction     EF 60%, echo, 01/2010  . Normal nuclear stress test     06/2005 , also ABI normal 2007  . Hypercholesterolemia   . Fracture of metacarpal bone 11/30/11  . Aortic dissection     Surgical repair February, 2014  . Mitral regurgitation   . Bladder cancer     recurrence with TUR-B March '09  . Prostate cancer     radiation tx.  . Diabetes mellitus with neuropathy 12/04/2006    Qualifier: Diagnosis of  By: Linda Hedges MD, Heinz Knuckles  medications - DPP4     Past Surgical History  Procedure Laterality Date  . Lumbar laminectomy      '02  . Tur-bt '06, '09    . Bladder cancer biopsy  10/16/2010    negative for malignancy  . Bladder microscopic  04/13/2007    high grade papillary urothelial lesions  . 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 Prostate biopsy  09/25/2011    GLEASON 3+3=6 AND 4+3=7  . Ascending aortic root replacement N/A 04/15/2012     Procedure: ASCENDING AORTIC ROOT REPLACEMENT;  Surgeon: Gaye Pollack, MD;  Location: [facility];  Service: Open Heart Surgery;  Laterality: N/A;  . Tee without cardioversion N/A 04/15/2012    Procedure: TRANSESOPHAGEAL ECHOCARDIOGRAM (TEE);  Surgeon: Gaye Pollack, MD;  Location: [facility];  Service: Open Heart Surgery;  Laterality: N/A;  . Exploration post operative open heart  04/2012  . Cystoscopy/retrograde/ureteroscopy Bilateral 04/04/2013    Procedure: CYSTOSCOPY WITH RETROGRADE PYELOGRAM AND BLADDER BIOPSY;  Surgeon: Molli Hazard, MD;  Location: [facility];  Service: Urology;  Laterality: Bilateral;  . Herniatic disc surgery      Patient Active Problem List   Diagnosis Date Noted  . Warfarin anticoagulation     Priority: High  . Syncope 07/19/2013  . Advanced care planning/counseling discussion 04/12/2013  . Encounter for therapeutic drug monitoring 04/12/2013  . Insomnia 04/11/2013  . Chronic combined systolic and diastolic CHF (congestive heart failure) 09/29/2012  . Mitral regurgitation   . Ascending aortic dissection 04/16/2012  . Malignant neoplasm of prostate 10/27/2011  . Hypertension   . GERD (gastroesophageal reflux disease)   . Aortic stenosis   . Pulmonary hypertension   . Hyperlipidemia   . Ejection fraction   . Normal nuclear stress test   . Routine general medical examination at a health care facility 08/22/2010  . Bladder cancer   . Gout   . Chronic atrial fibrillation   . HYPERTROPHY PROSTATE W/O UR OBST & OTH LUTS 09/07/2007  . DEGENERATIVE JOINT DISEASE, GENERALIZED 09/07/2007  . NEOP, MALIGNANT, BLADDER NEC 12/04/2006  . Diabetes mellitus with neuropathy 12/04/2006    ROS   Patient denies fever, chills, headache, sweats, rash, change in vision, change in hearing, cough, nausea vomiting, urinary symptoms. All other systems are reviewed and are negative.  PHYSICAL EXAM  Patient has actually lost some weight. He is oriented to person time and place. Affect  is normal. Head is atraumatic. Sclera and conjunctiva are normal. There is no jugulovenous distention. Lungs are clear. Respiratory effort is nonlabored. Cardiac exam reveals S1 and S2. The abdomen is soft. There is no significant peripheral edema. He has a small area of ecchymoses in the right antecubital fossa after some of his blood drawn. There no musculoskeletal deformities.  Filed Vitals:   08/01/13 1422  BP: 137/70  Pulse: 50  Height: 5\' 7"  (1.702 m)  Weight: 223 lb (101.152 kg)   This  ASSESSMENT & PLAN

## 2013-08-01 NOTE — Assessment & Plan Note (Signed)
The patient's event was clearly related to drinking too much vodka. He has cut back significantly. No further workup.

## 2013-08-01 NOTE — Assessment & Plan Note (Signed)
He has mild aortic stenosis. This was proven by very recent echo in the hospital. No further workup.

## 2013-08-03 ENCOUNTER — Telehealth: Payer: Self-pay | Admitting: *Deleted

## 2013-08-03 ENCOUNTER — Telehealth: Payer: Self-pay | Admitting: Cardiology

## 2013-08-03 NOTE — Telephone Encounter (Signed)
**Note De-identified Eric Rivers Obfuscation** The pt is advised and he verbalized understanding. 

## 2013-08-03 NOTE — Telephone Encounter (Signed)
I have no idea about this. It certainly is a good idea but I do not know anything about any of the companies.

## 2013-08-03 NOTE — Telephone Encounter (Signed)
New message     Pt want to know if Dr Ron Parker had a recommendation on a medical alert company.  The people who provide a necklace or bracelet for people who live alone and they get emergency help.  Pt said he fell recently and it was a while before anyone found him.

## 2013-08-03 NOTE — Telephone Encounter (Signed)
Not sure what to say about this since I dont know that much about it, but would probably be a good idea for safety to be able to notify someone if he has any trouble, or falls

## 2013-08-03 NOTE — Telephone Encounter (Signed)
Please advise 

## 2013-08-03 NOTE — Telephone Encounter (Signed)
Pt called requesting advice on Medical Alert.  Please advise.

## 2013-08-04 DIAGNOSIS — E1159 Type 2 diabetes mellitus with other circulatory complications: Secondary | ICD-10-CM | POA: Diagnosis not present

## 2013-08-04 DIAGNOSIS — I739 Peripheral vascular disease, unspecified: Secondary | ICD-10-CM | POA: Diagnosis not present

## 2013-08-04 DIAGNOSIS — L608 Other nail disorders: Secondary | ICD-10-CM | POA: Diagnosis not present

## 2013-08-04 NOTE — Telephone Encounter (Signed)
Spoke with pt advised of MDs message 

## 2013-08-18 ENCOUNTER — Ambulatory Visit (INDEPENDENT_AMBULATORY_CARE_PROVIDER_SITE_OTHER): Payer: Medicare Other | Admitting: General Practice

## 2013-08-18 DIAGNOSIS — I272 Pulmonary hypertension, unspecified: Secondary | ICD-10-CM

## 2013-08-18 DIAGNOSIS — D239 Other benign neoplasm of skin, unspecified: Secondary | ICD-10-CM | POA: Diagnosis not present

## 2013-08-18 DIAGNOSIS — Z7901 Long term (current) use of anticoagulants: Secondary | ICD-10-CM | POA: Diagnosis not present

## 2013-08-18 DIAGNOSIS — Z5181 Encounter for therapeutic drug level monitoring: Secondary | ICD-10-CM | POA: Diagnosis not present

## 2013-08-18 DIAGNOSIS — I2789 Other specified pulmonary heart diseases: Secondary | ICD-10-CM

## 2013-08-18 DIAGNOSIS — I4891 Unspecified atrial fibrillation: Secondary | ICD-10-CM

## 2013-08-18 DIAGNOSIS — L538 Other specified erythematous conditions: Secondary | ICD-10-CM | POA: Diagnosis not present

## 2013-08-18 DIAGNOSIS — I482 Chronic atrial fibrillation, unspecified: Secondary | ICD-10-CM

## 2013-08-18 DIAGNOSIS — L570 Actinic keratosis: Secondary | ICD-10-CM | POA: Diagnosis not present

## 2013-08-18 DIAGNOSIS — L819 Disorder of pigmentation, unspecified: Secondary | ICD-10-CM | POA: Diagnosis not present

## 2013-08-18 DIAGNOSIS — Z85820 Personal history of malignant melanoma of skin: Secondary | ICD-10-CM | POA: Diagnosis not present

## 2013-08-18 DIAGNOSIS — Z85828 Personal history of other malignant neoplasm of skin: Secondary | ICD-10-CM | POA: Diagnosis not present

## 2013-08-18 LAB — POCT INR: INR: 1.6

## 2013-08-18 NOTE — Progress Notes (Signed)
Pre visit review using our clinic review tool, if applicable. No additional management support is needed unless otherwise documented below in the visit note. 

## 2013-08-29 ENCOUNTER — Other Ambulatory Visit: Payer: Self-pay

## 2013-08-29 MED ORDER — POTASSIUM CHLORIDE CRYS ER 10 MEQ PO TBCR: 10.0000 meq | EXTENDED_RELEASE_TABLET | Freq: Every day | ORAL | Status: DC

## 2013-09-15 ENCOUNTER — Ambulatory Visit (INDEPENDENT_AMBULATORY_CARE_PROVIDER_SITE_OTHER): Payer: Medicare Other | Admitting: General Practice

## 2013-09-15 DIAGNOSIS — Z7901 Long term (current) use of anticoagulants: Secondary | ICD-10-CM | POA: Diagnosis not present

## 2013-09-15 DIAGNOSIS — I4891 Unspecified atrial fibrillation: Secondary | ICD-10-CM

## 2013-09-15 DIAGNOSIS — I2789 Other specified pulmonary heart diseases: Secondary | ICD-10-CM | POA: Diagnosis not present

## 2013-09-15 DIAGNOSIS — Z5181 Encounter for therapeutic drug level monitoring: Secondary | ICD-10-CM | POA: Diagnosis not present

## 2013-09-15 DIAGNOSIS — I482 Chronic atrial fibrillation, unspecified: Secondary | ICD-10-CM

## 2013-09-15 DIAGNOSIS — I272 Pulmonary hypertension, unspecified: Secondary | ICD-10-CM

## 2013-09-15 LAB — POCT INR: INR: 2.2

## 2013-09-15 NOTE — Progress Notes (Signed)
Pre visit review using our clinic review tool, if applicable. No additional management support is needed unless otherwise documented below in the visit note. 

## 2013-09-16 ENCOUNTER — Other Ambulatory Visit: Payer: Self-pay | Admitting: Internal Medicine

## 2013-09-16 ENCOUNTER — Other Ambulatory Visit: Payer: Self-pay | Admitting: General Practice

## 2013-09-22 ENCOUNTER — Other Ambulatory Visit: Payer: Self-pay

## 2013-09-22 MED ORDER — FUROSEMIDE 40 MG PO TABS: 40.0000 mg | ORAL_TABLET | Freq: Every morning | ORAL | Status: DC

## 2013-09-27 ENCOUNTER — Encounter: Payer: Self-pay | Admitting: Internal Medicine

## 2013-09-27 ENCOUNTER — Encounter: Payer: Self-pay | Admitting: Gastroenterology

## 2013-09-27 ENCOUNTER — Ambulatory Visit (INDEPENDENT_AMBULATORY_CARE_PROVIDER_SITE_OTHER): Payer: Medicare Other | Admitting: Internal Medicine

## 2013-09-27 VITALS — BP 112/70 | HR 62 | Temp 98.2°F | Ht 68.0 in | Wt 224.0 lb

## 2013-09-27 DIAGNOSIS — E1349 Other specified diabetes mellitus with other diabetic neurological complication: Secondary | ICD-10-CM

## 2013-09-27 DIAGNOSIS — I5042 Chronic combined systolic (congestive) and diastolic (congestive) heart failure: Secondary | ICD-10-CM

## 2013-09-27 DIAGNOSIS — I10 Essential (primary) hypertension: Secondary | ICD-10-CM | POA: Diagnosis not present

## 2013-09-27 DIAGNOSIS — E0840 Diabetes mellitus due to underlying condition with diabetic neuropathy, unspecified: Secondary | ICD-10-CM

## 2013-09-27 DIAGNOSIS — I509 Heart failure, unspecified: Secondary | ICD-10-CM

## 2013-09-27 DIAGNOSIS — E1142 Type 2 diabetes mellitus with diabetic polyneuropathy: Secondary | ICD-10-CM

## 2013-09-27 MED ORDER — METOPROLOL TARTRATE 25 MG PO TABS: 25.0000 mg | ORAL_TABLET | Freq: Two times a day (BID) | ORAL | Status: DC

## 2013-09-27 MED ORDER — FUROSEMIDE 40 MG PO TABS: ORAL_TABLET | ORAL | Status: DC

## 2013-09-27 MED ORDER — METOPROLOL SUCCINATE ER 25 MG PO TB24: 12.5000 mg | ORAL_TABLET | Freq: Two times a day (BID) | ORAL | Status: DC

## 2013-09-27 NOTE — Progress Notes (Signed)
Subjective:    Patient ID: Eric Rivers, male    DOB: 04/26/1930, 80 y.o.   MRN: [id number]  HPI  Here with worsening LE edema with flare of redness near both ankles, admits to increased salt in diet. Pt denies chest pain, increased sob or doe, wheezing, orthopnea, PND, palpitations, dizziness or syncope.   Pt denies fever, wt loss, night sweats, loss of appetite, or other constitutional symptoms, though documented wt loss from 230 to 224 today.  Pt denies new neurological symptoms such as new headache, or facial or extremity weakness or numbness   Pt denies polydipsia, polyuria, Past Medical History  Diagnosis Date  . Whooping cough   . Atrial fibrillation      Chronic,   24 hour holter, September, 2011.... atrial fib rate is controlled.... there is some bradycardia but  no marked pauses  . Diabetes mellitus     type 2  . Hypertension   . DDD (degenerative disc disease)     lumbar spine  . GERD (gastroesophageal reflux disease)   . Gout   . Fluid overload   . ACE-inhibitor cough   . Shortness of breath     on exertion  . Aortic stenosis     mild....echo... september... 2010/ mild... echo.[name] KitchenMarland KitchenDec, 2011  . Pulmonary hypertension     18mmHg, echo, 01/2010  . Hyperlipidemia   . Hx of colonoscopy     approx. 10 years  . Warfarin anticoagulation     Atrial fib  . Ejection fraction     EF 60%, echo, 01/2010  . Normal nuclear stress test     06/2005 , also ABI normal 2007  . Hypercholesterolemia   . Fracture of metacarpal bone 11/30/11  . Aortic dissection     Surgical repair February, 2014  . Mitral regurgitation   . Bladder cancer     recurrence with TUR-B March '09  . Prostate cancer     radiation tx.  . Diabetes mellitus with neuropathy 12/04/2006    Qualifier: Diagnosis of  By: Linda Hedges MD, Heinz Knuckles  medications - DPP4    Past Surgical History  Procedure Laterality Date  . Lumbar laminectomy      '02  . Tur-bt '06, '09    . Bladder cancer biopsy  10/16/2010   negative for malignancy  . Bladder microscopic  04/13/2007    high grade papillary urothelial lesions  . 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 Prostate biopsy  09/25/2011    GLEASON 3+3=6 AND 4+3=7  . Ascending aortic root replacement N/A 04/15/2012    Procedure: ASCENDING AORTIC ROOT REPLACEMENT;  Surgeon: Gaye Pollack, MD;  [facility];  Service: Open Heart Surgery;  Laterality: N/A;  . Tee without cardioversion N/A 04/15/2012    Procedure: TRANSESOPHAGEAL ECHOCARDIOGRAM (TEE);  Surgeon: Gaye Pollack, MD;  Location: [facility];  Service: Open Heart Surgery;  Laterality: N/A;  . Exploration post operative open heart  04/2012  . Cystoscopy/retrograde/ureteroscopy Bilateral 04/04/2013    Procedure: CYSTOSCOPY WITH RETROGRADE PYELOGRAM AND BLADDER BIOPSY;  Surgeon: Molli Hazard, MD;  Location: [facility];  Service: Urology;  Laterality: Bilateral;  . Herniatic disc surgery      reports that he quit smoking about 38 years ago. His smoking use included Cigarettes. He smoked 1.00 pack per day. He has never used smokeless tobacco. He reports that he drinks about 7 ounces of alcohol per week. He reports that he does  not use illicit drugs. family history includes Cancer in his father; Heart disease in his mother; Hypertension in his mother; Prostate cancer (age of onset: 64) in his father; Stroke in his father. No Known Allergies Current Outpatient Prescriptions on File Prior to Visit  Medication Sig Dispense Refill  . acetaminophen (TYLENOL) 500 MG tablet Take 1,000 mg by mouth 3 (three) times daily.      [name] Kitchen allopurinol (ZYLOPRIM) 300 MG tablet Take 300 mg by mouth daily.      [name] Kitchen amLODipine (NORVASC) 2.5 MG tablet Take 1 tablet (2.5 mg total) by mouth daily.  90 tablet  3  . aspirin 325 MG tablet Take 325 mg by mouth daily.      [name] Kitchen atorvastatin (LIPITOR) 20 MG tablet Take 20 mg by mouth every morning.      . Calcium Carb-Cholecalciferol (CALCIUM 600 + D PO)  Take 1 tablet by mouth every morning.      . chlorpheniramine-HYDROcodone (TUSSIONEX) 10-8 MG/5ML LQCR Take 5 mLs by mouth every 12 (twelve) hours as needed for cough.  115 mL  0  . desonide (DESOWEN) 0.05 % lotion Apply 1 application topically daily as needed (for face).      . Doxylamine Succinate, Sleep, (SLEEP AID PO) Take 1 tablet by mouth as needed (for sleep).       . finasteride (PROSCAR) 5 MG tablet Take 5 mg by mouth every morning.       [name] Kitchen ketoconazole (NIZORAL) 2 % shampoo Apply 1 application topically 4 (four) times a week.      . Multiple Vitamins-Minerals (ONCOVITE PO) Take 1 tablet by mouth daily.       . pantoprazole (PROTONIX) 40 MG tablet Take 40 mg by mouth daily.      . potassium chloride (K-DUR,KLOR-CON) 10 MEQ tablet Take 1 tablet (10 mEq total) by mouth daily.  30 tablet  4  . senna (SENOKOT) 8.6 MG tablet Take 1 tablet by mouth as needed for constipation.       . sitaGLIPtin (JANUVIA) 100 MG tablet Take 100 mg by mouth daily.      . Tamsulosin HCl (FLOMAX) 0.4 MG CAPS Take 0.4 mg by mouth at bedtime.       . traZODone (DESYREL) 50 MG tablet Take 0.5-1 tablets (25-50 mg total) by mouth at bedtime as needed for sleep.  90 tablet  1  . warfarin (COUMADIN) 4 MG tablet Take as directed by anticoagulation clinic  105 tablet  1   No current facility-administered medications on file prior to visit.   Review of Systems  Constitutional: Negative for unusual diaphoresis or other sweats  HENT: Negative for ringing in ear Eyes: Negative for double vision or worsening visual disturbance.  Respiratory: Negative for choking and stridor.   Gastrointestinal: Negative for vomiting or other signifcant bowel change Genitourinary: Negative for hematuria or decreased urine volume.  Musculoskeletal: Negative for other MSK pain or swelling Skin: Negative for color change and worsening wound.  Neurological: Negative for tremors and numbness other than noted  Psychiatric/Behavioral: Negative  for decreased concentration or agitation other than above       Objective:   Physical Exam BP 112/70  Pulse 62  Temp(Src) 98.2 F (36.8 C) (Oral)  Ht 5\' 8"  (1.727 m)  Wt 224 lb (101.606 kg)  BMI 34.07 kg/m2  SpO2 97% VS noted,  Constitutional: Pt appears well-developed, well-nourished.  HENT: Head: NCAT.  Right Ear: External ear normal.  Left Ear: External ear normal.  Eyes: .  Pupils are equal, round, and reactive to light. Conjunctivae and EOM are normal Neck: Normal range of motion. Neck supple.  Cardiovascular: Normal rate and regular rhythm.   Pulmonary/Chest: Effort normal and breath sounds normal.  Abd:  Soft, NT, ND, + BS Neurological: Pt is alert. Not confused , motor grossly intact Skin: 2+ edema to knees with nontender erythema above ankles bilat approx 4 x 4 cm area, also evidence for more chronic brownish stasis dermatitis more proximal Psychiatric: Pt behavior is normal. No agitation.     Assessment & Plan:

## 2013-09-27 NOTE — Progress Notes (Signed)
Pre visit review using our clinic review tool, if applicable. No additional management support is needed unless otherwise documented below in the visit note. 

## 2013-09-27 NOTE — Patient Instructions (Signed)
Please take all new medication as prescribed  - the increased lasix  Please continue all other medications as before, and refills have been done if requested.  Please have the pharmacy call with any other refills you may need.  Please continue your efforts at being more active, low cholesterol diet, and weight control.  Please keep your appointments with your specialists as you may have planned  No other labs or testing needed today

## 2013-09-28 ENCOUNTER — Telehealth: Payer: Self-pay | Admitting: Internal Medicine

## 2013-09-28 MED ORDER — FUROSEMIDE 40 MG PO TABS: ORAL_TABLET | ORAL | Status: DC

## 2013-09-28 NOTE — Telephone Encounter (Signed)
Patient called and states that [facility] is unable to fill his furosemide (LASIX) 40 MG rx until our office calls them to authorize the prescription since he just had the same prescription filled and is not yet due for a refill. Please advise.

## 2013-09-28 NOTE — Telephone Encounter (Signed)
Refill done.  Called the patient informed sent in.

## 2013-09-28 NOTE — Telephone Encounter (Signed)
Called the patient and states the pharmacy will not fill furosemide sent in yesterday as he already has a prescription just filled last week from his cardiologist.  States MD would need to contact pharmacy to ok other than fax.  Advise if he is to take as instructed by PCP yesterday he will run out.

## 2013-09-28 NOTE — Telephone Encounter (Signed)
This is confusing, as the pharmacy states the rx is the same, and pt is under impression it is not and will run out too soon.  I think the pt is correct, as I believe the rx on the med list was changed from lasix 40 qd, to lasix 40 bid with second dose prn persistent swelling only, and the quantity increased from 90 to 180.  OK to re-send the rx.  If this is the same as the cardiologist just did, then either rx would be ok to honor, and the pt should not run out early  This may need to be clarified with the pharmacy

## 2013-09-28 NOTE — Telephone Encounter (Signed)
Confirmed with pharmacy they would be able to fill before the 90 day supply ended if patient needed since a different set of  Instructions are on the second script sent in.  Patient informed as well.

## 2013-09-30 NOTE — Assessment & Plan Note (Signed)
Mild worsened, for increase lasix 40 bid, low salt diet, daily wts, call for wt loss > 1.5 lb per day, f/u next visit, also has card appt sept 2015

## 2013-09-30 NOTE — Assessment & Plan Note (Signed)
stable overall by history and exam, recent data reviewed with pt, and pt to continue medical treatment as before,  to f/u any worsening symptoms or concerns BP Readings from Last 3 Encounters:  09/27/13 112/70  08/01/13 137/70  07/22/13 132/82

## 2013-09-30 NOTE — Assessment & Plan Note (Signed)
stable overall by history and exam, recent data reviewed with pt, and pt to continue medical treatment as before,  to f/u any worsening symptoms or concerns Lab Results  Component Value Date   HGBA1C 6.9* 07/19/2013

## 2013-10-03 ENCOUNTER — Other Ambulatory Visit: Payer: Self-pay | Admitting: Internal Medicine

## 2013-10-03 ENCOUNTER — Other Ambulatory Visit: Payer: Self-pay

## 2013-10-03 ENCOUNTER — Telehealth: Payer: Self-pay | Admitting: Cardiology

## 2013-10-03 MED ORDER — POTASSIUM CHLORIDE CRYS ER 10 MEQ PO TBCR: 10.0000 meq | EXTENDED_RELEASE_TABLET | Freq: Every day | ORAL | Status: DC

## 2013-10-03 NOTE — Telephone Encounter (Signed)
New Prob    Requesting a 90 day supply of Potassium Chloride instead of a 30 day supply.

## 2013-10-13 ENCOUNTER — Ambulatory Visit (INDEPENDENT_AMBULATORY_CARE_PROVIDER_SITE_OTHER): Payer: Medicare Other | Admitting: Family

## 2013-10-13 DIAGNOSIS — Z5181 Encounter for therapeutic drug level monitoring: Secondary | ICD-10-CM | POA: Diagnosis not present

## 2013-10-13 DIAGNOSIS — Z7901 Long term (current) use of anticoagulants: Secondary | ICD-10-CM

## 2013-10-13 DIAGNOSIS — I482 Chronic atrial fibrillation, unspecified: Secondary | ICD-10-CM

## 2013-10-13 DIAGNOSIS — I272 Pulmonary hypertension, unspecified: Secondary | ICD-10-CM

## 2013-10-13 DIAGNOSIS — I4891 Unspecified atrial fibrillation: Secondary | ICD-10-CM | POA: Diagnosis not present

## 2013-10-13 DIAGNOSIS — I2789 Other specified pulmonary heart diseases: Secondary | ICD-10-CM

## 2013-10-13 LAB — POCT INR: INR: 2.8

## 2013-10-13 NOTE — Patient Instructions (Signed)
Continue to take 4 mg all days. Re-check in 4 week.  Anticoagulation Dose Instructions as of 10/13/2013     Eric Rivers Tue Wed Thu Fri Sat   New Dose 4 mg 4 mg 4 mg 4 mg 4 mg 4 mg 4 mg    Description       Continue to take 4 mg all days. Re-check in 4 week.

## 2013-10-14 ENCOUNTER — Other Ambulatory Visit: Payer: Self-pay | Admitting: *Deleted

## 2013-10-14 DIAGNOSIS — I7101 Dissection of thoracic aorta: Secondary | ICD-10-CM

## 2013-10-14 DIAGNOSIS — I739 Peripheral vascular disease, unspecified: Secondary | ICD-10-CM | POA: Diagnosis not present

## 2013-10-14 DIAGNOSIS — I71019 Dissection of thoracic aorta, unspecified: Secondary | ICD-10-CM

## 2013-10-14 DIAGNOSIS — E1159 Type 2 diabetes mellitus with other circulatory complications: Secondary | ICD-10-CM | POA: Diagnosis not present

## 2013-10-14 DIAGNOSIS — L608 Other nail disorders: Secondary | ICD-10-CM | POA: Diagnosis not present

## 2013-10-19 ENCOUNTER — Other Ambulatory Visit: Payer: Self-pay | Admitting: *Deleted

## 2013-10-19 DIAGNOSIS — I7100 Dissection of unspecified site of aorta: Secondary | ICD-10-CM

## 2013-10-19 DIAGNOSIS — I7101 Dissection of thoracic aorta: Secondary | ICD-10-CM

## 2013-10-19 DIAGNOSIS — I359 Nonrheumatic aortic valve disorder, unspecified: Secondary | ICD-10-CM

## 2013-10-19 DIAGNOSIS — I71019 Dissection of thoracic aorta, unspecified: Secondary | ICD-10-CM

## 2013-10-26 ENCOUNTER — Other Ambulatory Visit (INDEPENDENT_AMBULATORY_CARE_PROVIDER_SITE_OTHER): Payer: Medicare Other

## 2013-10-26 ENCOUNTER — Encounter: Payer: Self-pay | Admitting: Internal Medicine

## 2013-10-26 ENCOUNTER — Ambulatory Visit (INDEPENDENT_AMBULATORY_CARE_PROVIDER_SITE_OTHER): Payer: Medicare Other | Admitting: Internal Medicine

## 2013-10-26 VITALS — BP 110/72 | HR 52 | Temp 97.6°F | Wt 222.2 lb

## 2013-10-26 DIAGNOSIS — E1149 Type 2 diabetes mellitus with other diabetic neurological complication: Secondary | ICD-10-CM | POA: Diagnosis not present

## 2013-10-26 DIAGNOSIS — E1142 Type 2 diabetes mellitus with diabetic polyneuropathy: Secondary | ICD-10-CM

## 2013-10-26 DIAGNOSIS — E785 Hyperlipidemia, unspecified: Secondary | ICD-10-CM | POA: Diagnosis not present

## 2013-10-26 DIAGNOSIS — Z23 Encounter for immunization: Secondary | ICD-10-CM | POA: Diagnosis not present

## 2013-10-26 DIAGNOSIS — I10 Essential (primary) hypertension: Secondary | ICD-10-CM | POA: Diagnosis not present

## 2013-10-26 DIAGNOSIS — E1140 Type 2 diabetes mellitus with diabetic neuropathy, unspecified: Secondary | ICD-10-CM

## 2013-10-26 LAB — URINALYSIS, ROUTINE W REFLEX MICROSCOPIC
Bilirubin Urine: NEGATIVE
Hgb urine dipstick: NEGATIVE
KETONES UR: NEGATIVE
Leukocytes, UA: NEGATIVE
Nitrite: NEGATIVE
PH: 6 (ref 5.0–8.0)
SPECIFIC GRAVITY, URINE: 1.01 (ref 1.000–1.030)
Total Protein, Urine: NEGATIVE
UROBILINOGEN UA: 0.2 (ref 0.0–1.0)
Urine Glucose: NEGATIVE

## 2013-10-26 LAB — CBC WITH DIFFERENTIAL/PLATELET
BASOS ABS: 0 10*3/uL (ref 0.0–0.1)
Basophils Relative: 0.4 % (ref 0.0–3.0)
EOS PCT: 2.5 % (ref 0.0–5.0)
Eosinophils Absolute: 0.2 10*3/uL (ref 0.0–0.7)
HCT: 35.6 % — ABNORMAL LOW (ref 39.0–52.0)
HEMOGLOBIN: 11.4 g/dL — AB (ref 13.0–17.0)
LYMPHS ABS: 1 10*3/uL (ref 0.7–4.0)
LYMPHS PCT: 11.9 % — AB (ref 12.0–46.0)
MCHC: 32 g/dL (ref 30.0–36.0)
MCV: 93.8 fl (ref 78.0–100.0)
MONOS PCT: 8.9 % (ref 3.0–12.0)
Monocytes Absolute: 0.7 10*3/uL (ref 0.1–1.0)
NEUTROS ABS: 6.3 10*3/uL (ref 1.4–7.7)
Neutrophils Relative %: 76.3 % (ref 43.0–77.0)
Platelets: 216 10*3/uL (ref 150.0–400.0)
RBC: 3.8 Mil/uL — ABNORMAL LOW (ref 4.22–5.81)
RDW: 17.1 % — AB (ref 11.5–15.5)
WBC: 8.2 10*3/uL (ref 4.0–10.5)

## 2013-10-26 NOTE — Progress Notes (Signed)
Pre visit review using our clinic review tool, if applicable. No additional management support is needed unless otherwise documented below in the visit note. 

## 2013-10-26 NOTE — Progress Notes (Signed)
Subjective:    Patient ID: Eric Rivers, male    DOB: June 13, 1930, 80 y.o.   MRN: [id number]  HPI  Here to f/u; overall doing ok,  Pt denies chest pain, increased sob or doe, wheezing, orthopnea, PND, increased LE swelling, palpitations, dizziness or syncope.  Pt denies polydipsia, polyuria, or low sugar symptoms such as weakness or confusion improved with po intake.  Pt denies new neurological symptoms such as new headache, or facial or extremity weakness.   Pt states overall good compliance with meds, has been trying to follow lower cholesterol, diabetic diet, with wt overall stable,  but little exercise however.Swelling much improved on increased lasix. Neuropathy nubmness seesms to be increasing but not painful.  Past Medical History  Diagnosis Date  . Whooping cough   . Atrial fibrillation      Chronic,   24 hour holter, September, 2011.... atrial fib rate is controlled.... there is some bradycardia but  no marked pauses  . Diabetes mellitus     type 2  . Hypertension   . DDD (degenerative disc disease)     lumbar spine  . GERD (gastroesophageal reflux disease)   . Gout   . Fluid overload   . ACE-inhibitor cough   . Shortness of breath     on exertion  . Aortic stenosis     mild....echo... september... 2010/ mild... echo.[name] KitchenMarland KitchenDec, 2011  . Pulmonary hypertension     10mmHg, echo, 01/2010  . Hyperlipidemia   . Hx of colonoscopy     approx. 10 years  . Warfarin anticoagulation     Atrial fib  . Ejection fraction     EF 60%, echo, 01/2010  . Normal nuclear stress test     06/2005 , also ABI normal 2007  . Hypercholesterolemia   . Fracture of metacarpal bone 11/30/11  . Aortic dissection     Surgical repair February, 2014  . Mitral regurgitation   . Bladder cancer     recurrence with TUR-B March '09  . Prostate cancer     radiation tx.  . Diabetes mellitus with neuropathy 12/04/2006    Qualifier: Diagnosis of  By: Linda Hedges MD, Heinz Knuckles  medications - DPP4    Past Surgical  History  Procedure Laterality Date  . Lumbar laminectomy      '02  . Tur-bt '06, '09    . Bladder cancer biopsy  10/16/2010    negative for malignancy  . Bladder microscopic  04/13/2007    high grade papillary urothelial lesions  . 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 Prostate biopsy  09/25/2011    GLEASON 3+3=6 AND 4+3=7  . Ascending aortic root replacement N/A 04/15/2012    Procedure: ASCENDING AORTIC ROOT REPLACEMENT;  Surgeon: Gaye Pollack, MD;  Location: [facility];  Service: Open Heart Surgery;  Laterality: N/A;  . Tee without cardioversion N/A 04/15/2012    Procedure: TRANSESOPHAGEAL ECHOCARDIOGRAM (TEE);  Surgeon: Gaye Pollack, MD;  Location: [facility];  Service: Open Heart Surgery;  Laterality: N/A;  . Exploration post operative open heart  04/2012  . Cystoscopy/retrograde/ureteroscopy Bilateral 04/04/2013    Procedure: CYSTOSCOPY WITH RETROGRADE PYELOGRAM AND BLADDER BIOPSY;  Surgeon: Molli Hazard, MD;  Location: [facility];  Service: Urology;  Laterality: Bilateral;  . Herniatic disc surgery      reports that he quit smoking about 38 years ago. His smoking use included Cigarettes. He smoked 1.00 pack per day. He has  never used smokeless tobacco. He reports that he drinks about 7 ounces of alcohol per week. He reports that he does not use illicit drugs. family history includes Cancer in his father; Heart disease in his mother; Hypertension in his mother; Prostate cancer (age of onset: 77) in his father; Stroke in his father. No Known Allergies  Review of Systems  Constitutional: Negative for unusual diaphoresis or other sweats  HENT: Negative for ringing in ear Eyes: Negative for double vision or worsening visual disturbance.  Respiratory: Negative for choking and stridor.   Gastrointestinal: Negative for vomiting or other signifcant bowel change Genitourinary: Negative for hematuria or decreased urine volume.  Musculoskeletal: Negative for  other MSK pain or swelling Skin: Negative for color change and worsening wound.  Neurological: Negative for tremors and numbness other than noted  Psychiatric/Behavioral: Negative for decreased concentration or agitation other than above       Objective:   Physical Exam BP 110/72  Pulse 52  Temp(Src) 97.6 F (36.4 C) (Oral)  Wt 222 lb 4 oz (100.812 kg)  SpO2 95% VS noted,  Constitutional: Pt appears well-developed, well-nourished.  HENT: Head: NCAT.  Right Ear: External ear normal.  Left Ear: External ear normal.  Eyes: . Pupils are equal, round, and reactive to light. Conjunctivae and EOM are normal Neck: Normal range of motion. Neck supple.  Cardiovascular: Normal rate and regular rhythm.   Pulmonary/Chest: Effort normal and breath sounds normal.  Abd:  Soft, NT, ND, + BS Neurological: Pt is alert. Not confused , motor grossly intact Skin: Skin is warm. No rash, no LE edema Psychiatric: Pt behavior is normal. No agitation.     Assessment & Plan:

## 2013-10-26 NOTE — Patient Instructions (Addendum)

## 2013-10-27 ENCOUNTER — Encounter: Payer: Self-pay | Admitting: Internal Medicine

## 2013-10-27 ENCOUNTER — Ambulatory Visit: Payer: Medicare Other

## 2013-10-27 DIAGNOSIS — R7309 Other abnormal glucose: Secondary | ICD-10-CM

## 2013-10-27 LAB — HEPATIC FUNCTION PANEL
ALBUMIN: 4 g/dL (ref 3.5–5.2)
ALT: 16 U/L (ref 0–53)
AST: 26 U/L (ref 0–37)
Alkaline Phosphatase: 97 U/L (ref 39–117)
Bilirubin, Direct: 0.2 mg/dL (ref 0.0–0.3)
Total Bilirubin: 0.8 mg/dL (ref 0.2–1.2)
Total Protein: 7.8 g/dL (ref 6.0–8.3)

## 2013-10-27 LAB — HEMOGLOBIN A1C: Hgb A1c MFr Bld: 6.6 % — ABNORMAL HIGH (ref 4.6–6.5)

## 2013-10-27 LAB — BASIC METABOLIC PANEL
BUN: 20 mg/dL (ref 6–23)
CALCIUM: 9.6 mg/dL (ref 8.4–10.5)
CO2: 30 mEq/L (ref 19–32)
Chloride: 99 mEq/L (ref 96–112)
Creatinine, Ser: 1 mg/dL (ref 0.4–1.5)
GFR: 79.84 mL/min (ref >60.00)
GLUCOSE: 120 mg/dL — AB (ref 70–99)
Potassium: 4.4 mEq/L (ref 3.5–5.1)
SODIUM: 140 meq/L (ref 135–145)

## 2013-10-27 LAB — LIPID PANEL
Cholesterol: 116 mg/dL (ref 0–200)
HDL: 45.8 mg/dL
LDL Cholesterol: 39 mg/dL (ref 0–99)
NonHDL: 70.2
Total CHOL/HDL Ratio: 3
Triglycerides: 154 mg/dL — ABNORMAL HIGH (ref 0.0–149.0)
VLDL: 30.8 mg/dL (ref 0.0–40.0)

## 2013-10-27 LAB — TSH: TSH: 1.69 u[IU]/mL (ref 0.35–4.50)

## 2013-10-31 ENCOUNTER — Telehealth: Payer: Self-pay | Admitting: Internal Medicine

## 2013-10-31 NOTE — Telephone Encounter (Signed)
Patient would like to know if all his shots are up to date.  At the end of October patient is traveling out of the country until December.

## 2013-10-31 NOTE — Assessment & Plan Note (Signed)
stable overall by history and exam, recent data reviewed with pt, and pt to continue medical treatment as before,  to f/u any worsening symptoms or concerns BP Readings from Last 3 Encounters:  10/26/13 110/72  09/27/13 112/70  08/01/13 137/70

## 2013-10-31 NOTE — Assessment & Plan Note (Signed)
stable overall by history and exam, recent data reviewed with pt, and pt to continue medical treatment as before,  to f/u any worsening symptoms or concerns Lab Results  Component Value Date   LDLCALC 39 10/26/2013

## 2013-10-31 NOTE — Assessment & Plan Note (Signed)
With some subjective worsening numbness, o/w stable overall by history and exam, recent data reviewed with pt, and pt to continue medical treatment as before,  to f/u any worsening symptoms or concerns Lab Results  Component Value Date   HGBA1C 6.6* 10/27/2013

## 2013-11-01 DIAGNOSIS — C61 Malignant neoplasm of prostate: Secondary | ICD-10-CM | POA: Diagnosis not present

## 2013-11-01 NOTE — Telephone Encounter (Signed)
Called the patient and confirmed he wanted to make sure he was up to date on vaccines our office requires as up to date.  The countries he is visiting does not require anything and he had already contacted the CDC.  The patient was just seen on 10/26/13 and up to date on all immunizations per PCP.

## 2013-11-01 NOTE — Telephone Encounter (Signed)
Not sure if I routed this correctly

## 2013-11-01 NOTE — Telephone Encounter (Signed)
This is a question of Travel Medicine; note does not mention where he is traveling  Please consider check CDC website regarding immunizations or medication prophlyaxis (such as for malaria) recommended for the specific country he is visiting  He may want to visit the [facility] as well to check on this, as they are able to provide immunizations not normally given here, such as typhoid or other unsual vaccinations

## 2013-11-02 ENCOUNTER — Ambulatory Visit (INDEPENDENT_AMBULATORY_CARE_PROVIDER_SITE_OTHER): Payer: Medicare Other | Admitting: Cardiology

## 2013-11-02 ENCOUNTER — Encounter: Payer: Self-pay | Admitting: Cardiology

## 2013-11-02 VITALS — BP 134/64 | HR 64 | Ht 68.0 in | Wt 224.4 lb

## 2013-11-02 DIAGNOSIS — I7101 Dissection of ascending aorta: Secondary | ICD-10-CM

## 2013-11-02 DIAGNOSIS — I10 Essential (primary) hypertension: Secondary | ICD-10-CM

## 2013-11-02 DIAGNOSIS — I340 Nonrheumatic mitral (valve) insufficiency: Secondary | ICD-10-CM

## 2013-11-02 DIAGNOSIS — R0989 Other specified symptoms and signs involving the circulatory and respiratory systems: Secondary | ICD-10-CM

## 2013-11-02 DIAGNOSIS — I71019 Dissection of thoracic aorta, unspecified: Secondary | ICD-10-CM | POA: Diagnosis not present

## 2013-11-02 DIAGNOSIS — Z7901 Long term (current) use of anticoagulants: Secondary | ICD-10-CM

## 2013-11-02 DIAGNOSIS — I359 Nonrheumatic aortic valve disorder, unspecified: Secondary | ICD-10-CM | POA: Diagnosis not present

## 2013-11-02 DIAGNOSIS — I5032 Chronic diastolic (congestive) heart failure: Secondary | ICD-10-CM | POA: Insufficient documentation

## 2013-11-02 DIAGNOSIS — I4891 Unspecified atrial fibrillation: Secondary | ICD-10-CM

## 2013-11-02 DIAGNOSIS — I509 Heart failure, unspecified: Secondary | ICD-10-CM

## 2013-11-02 DIAGNOSIS — R9430 Abnormal result of cardiovascular function study, unspecified: Secondary | ICD-10-CM

## 2013-11-02 DIAGNOSIS — I059 Rheumatic mitral valve disease, unspecified: Secondary | ICD-10-CM

## 2013-11-02 DIAGNOSIS — R55 Syncope and collapse: Secondary | ICD-10-CM

## 2013-11-02 DIAGNOSIS — I350 Nonrheumatic aortic (valve) stenosis: Secondary | ICD-10-CM

## 2013-11-02 DIAGNOSIS — I482 Chronic atrial fibrillation, unspecified: Secondary | ICD-10-CM

## 2013-11-02 NOTE — Assessment & Plan Note (Signed)
The patient has mild aortic stenosis. His last echo was May, 2015. No change in therapy.

## 2013-11-02 NOTE — Assessment & Plan Note (Signed)
Blood pressure is controlled. No change in therapy. 

## 2013-11-02 NOTE — Patient Instructions (Addendum)
Your physician recommends that you continue on your current medications as directed. Please refer to the Current Medication list given to you today.  Your physician wants you to follow-up in: 5 months. You will receive a reminder letter in the mail two months in advance. If you don't receive a letter, please call our office to schedule the follow-up appointment.

## 2013-11-02 NOTE — Assessment & Plan Note (Signed)
In the past the patient had dissection of the ascending aorta. This was repaired surgically emergently in February, 2014. He is stable.

## 2013-11-02 NOTE — Progress Notes (Signed)
Patient ID: Eric Rivers, male   DOB: 1930/07/02, 80 y.o.   MRN: [id number]    HPI  Patient is seen today for followup diastolic CHF. He's had his meds adjusted by his primary physician recently. He is learning how to adjust his diuretic at home, taking extra if needed. He says that he is careful with his salt intake. I also encouraged him to be careful with his total fluid intake. We had a long discussion about this because his neighbor continuously tells him he should drink more fluid. I explained to him that this was not the case because he retains fluid. At this time is not having any chest pain or shortness of breath.  No Known Allergies  Current Outpatient Prescriptions  Medication Sig Dispense Refill  . acetaminophen (TYLENOL) 500 MG tablet Take 1,000 mg by mouth 3 (three) times daily.      [name] Kitchen allopurinol (ZYLOPRIM) 300 MG tablet Take 300 mg by mouth daily.      [name] Kitchen amLODipine (NORVASC) 2.5 MG tablet Take 1 tablet (2.5 mg total) by mouth daily.  90 tablet  3  . aspirin 325 MG tablet Take 325 mg by mouth daily.      [name] Kitchen atorvastatin (LIPITOR) 20 MG tablet Take 20 mg by mouth every morning.      . Calcium Carb-Cholecalciferol (CALCIUM 600 + D PO) Take 1 tablet by mouth every morning.      [name] Kitchen desonide (DESOWEN) 0.05 % lotion Apply 1 application topically daily as needed (for face).      . finasteride (PROSCAR) 5 MG tablet Take 5 mg by mouth every morning.       . furosemide (LASIX) 40 MG tablet 1 tab by mouth in each am, and 1 tab in the PM as needed for persistent swelling  180 tablet  3  . JANUVIA 100 MG tablet take 1 tablet by mouth once daily  90 tablet  3  . ketoconazole (NIZORAL) 2 % shampoo Apply 1 application topically 4 (four) times a week.      . metoprolol tartrate (LOPRESSOR) 25 MG tablet Take 1 tablet (25 mg total) by mouth 2 (two) times daily.  180 tablet  3  . Multiple Vitamins-Minerals (ONCOVITE PO) Take 1 tablet by mouth daily.       . pantoprazole (PROTONIX) 40 MG tablet  Take 40 mg by mouth daily.      . potassium chloride (K-DUR,KLOR-CON) 10 MEQ tablet Take 1 tablet (10 mEq total) by mouth daily.  90 tablet  2  . senna (SENOKOT) 8.6 MG tablet Take 1 tablet by mouth as needed for constipation.       . Tamsulosin HCl (FLOMAX) 0.4 MG CAPS Take 0.4 mg by mouth at bedtime.       . traZODone (DESYREL) 50 MG tablet Take 0.5-1 tablets (25-50 mg total) by mouth at bedtime as needed for sleep.  90 tablet  1  . warfarin (COUMADIN) 4 MG tablet Take as directed by anticoagulation clinic  105 tablet  1   No current facility-administered medications for this visit.    History   Social History  . Marital Status: Single    Spouse Name: N/A    Number of Children: 0  . Years of Education: 15   Occupational History  . history and Advice worker     retired   Social History Main Topics  . Smoking status: Former Smoker -- 1.00 packs/day    Types: Cigarettes    Quit date:  03/04/1975  . Smokeless tobacco: Never Used  . Alcohol Use: 7.0 oz/week    14 drink(s) per week     Comment: liquor daily  . Drug Use: No  . Sexual Activity: Not Currently   Other Topics Concern  . Not on file   Social History Narrative   Confirmed bat[place]lor. Retired Education officer, museum. World traveler- Ambulance person. I- ADLS. End of life Care   No CPR, no prolonged intubation, no prolonged artificial hydration or feeding, no heroic or futile measures.       Next trip Sept [place]13 [place]      Last cruise - two cruises back to back to the [place] in January '15. May'15: From [place] with stops in [place], [place], [place], [place], [place]. 15 days      Fall '15: 50 Days from [place]. [place], [place], [place], [place], [place], [place].       May '15 - 15 days up the [employer] as far as [place]             Family History  Problem Relation Age of Onset  . Prostate cancer Father 44     passed with prostate ca  . Cancer Father   . Stroke Father   . Heart disease Mother   . Hypertension Mother     Past Medical History  Diagnosis Date  . Whooping cough   . Atrial fibrillation      Chronic,   24 hour holter, September, 2011.... atrial fib rate is controlled.... there is some bradycardia but  no marked pauses  . Diabetes mellitus     type 2  . Hypertension   . DDD (degenerative disc disease)     lumbar spine  . GERD (gastroesophageal reflux disease)   . Gout   . Fluid overload   . ACE-inhibitor cough   . Shortness of breath     on exertion  . Aortic stenosis     mild....echo... september... 2010/ mild... echo.[name] KitchenMarland KitchenDec, 2011  . Pulmonary hypertension     64mmHg, echo, 01/2010  . Hyperlipidemia   . Hx of colonoscopy     approx. 10 years  . Warfarin anticoagulation     Atrial fib  . Ejection fraction     EF 60%, echo, 01/2010  . Normal nuclear stress test     06/2005 , also ABI normal 2007  . Hypercholesterolemia   . Fracture of metacarpal bone 11/30/11  . Aortic dissection     Surgical repair February, 2014  . Mitral regurgitation   . Bladder cancer     recurrence with TUR-B March '09  . Prostate cancer     radiation tx.  . Diabetes mellitus with neuropathy 12/04/2006    Qualifier: Diagnosis of  By: Linda Hedges MD, Heinz Knuckles  medications - DPP4     Past Surgical History  Procedure Laterality Date  . Lumbar laminectomy      '02  . Tur-bt '06, '09    . Bladder cancer biopsy  10/16/2010    negative for malignancy  . Bladder microscopic  04/13/2007    high grade papillary urothelial lesions  . 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 Prostate biopsy  09/25/2011    GLEASON 3+3=6 AND 4+3=7  . Ascending aortic root replacement N/A 04/15/2012    Procedure: ASCENDING AORTIC ROOT REPLACEMENT;  Surgeon: Fernande Boyden  Cyndia Bent, MD;  Location: [facility];  Service: Open Heart Surgery;  Laterality: N/A;  . Tee without cardioversion N/A 04/15/2012      Procedure: TRANSESOPHAGEAL ECHOCARDIOGRAM (TEE);  Surgeon: Gaye Pollack, MD;  Location: [facility];  Service: Open Heart Surgery;  Laterality: N/A;  . Exploration post operative open heart  04/2012  . Cystoscopy/retrograde/ureteroscopy Bilateral 04/04/2013    Procedure: CYSTOSCOPY WITH RETROGRADE PYELOGRAM AND BLADDER BIOPSY;  Surgeon: Molli Hazard, MD;  Location: [facility];  Service: Urology;  Laterality: Bilateral;  . Herniatic disc surgery      Patient Active Problem List   Diagnosis Date Noted  . Warfarin anticoagulation     Priority: High  . Syncope 07/19/2013  . Advanced care planning/counseling discussion 04/12/2013  . Encounter for therapeutic drug monitoring 04/12/2013  . Insomnia 04/11/2013  . Chronic combined systolic and diastolic CHF (congestive heart failure) 09/29/2012  . Mitral regurgitation   . Ascending aortic dissection 04/16/2012  . Malignant neoplasm of prostate 10/27/2011  . Hypertension   . GERD (gastroesophageal reflux disease)   . Aortic stenosis   . Pulmonary hypertension   . Hyperlipidemia   . Ejection fraction   . Normal nuclear stress test   . Routine general medical examination at a health care facility 08/22/2010  . Bladder cancer   . Gout   . Chronic atrial fibrillation   . HYPERTROPHY PROSTATE W/O UR OBST & OTH LUTS 09/07/2007  . DEGENERATIVE JOINT DISEASE, GENERALIZED 09/07/2007  . NEOP, MALIGNANT, BLADDER NEC 12/04/2006  . Diabetes mellitus with neuropathy 12/04/2006    ROS   Patient denies fever, chills, headache, sweats, rash, change in vision, change in hearing, chest pain, cough, nausea or vomiting, urinary symptoms. All other systems are reviewed and are negative.  PHYSICAL EXAM  Patient is oriented to person time and place. Affect is normal. He is overweight but his weight is stable. Head is atraumatic. Sclera and conjunctiva are normal. There is no jugulovenous distention. Lungs are clear. Respiratory effort is nonlabored. Cardiac  exam reveals S1 and S2. There is a soft murmur. The abdomen is soft. There is trace peripheral edema.  Filed Vitals:   11/02/13 1350  BP: 134/64  Pulse: 64  Height: 5\' 8"  (1.727 m)  Weight: 224 lb 6.4 oz (101.787 kg)     ASSESSMENT & PLAN

## 2013-11-02 NOTE — Assessment & Plan Note (Signed)
Patient continues on Coumadin for his atrial fibrillation. He tolerates this well.

## 2013-11-02 NOTE — Assessment & Plan Note (Signed)
There is chronic atrial fibrillation. Rate is controlled. He is anticoagulated. No change in therapy.

## 2013-11-02 NOTE — Assessment & Plan Note (Signed)
The patient's ejection fraction remains in the normal range of 55-60% by echo. He does have some right ventricular dysfunction. There is moderate pulmonary hypertension.

## 2013-11-02 NOTE — Assessment & Plan Note (Signed)
Patient does have mild mitral regurgitation. He does have pulmonary hypertension with a PA pressure 60 mm mercury.

## 2013-11-02 NOTE — Assessment & Plan Note (Signed)
The patient did have syncope in May, 2015. There was no cardiac etiology. It was thought that he had had some excess alcohol causing the problem.

## 2013-11-02 NOTE — Assessment & Plan Note (Signed)
The patient is stable at this time. He does have diastolic CHF. His volume status is stable at this time. He has been adjusting his diuretic at home if he has increased edema. Today had a long discussion with him about the concepts of limiting salt and fluid. He tells me that his neighbors always encouraging him to drink more fluid. I explained to him that he needs to limit his overall fluid. He will continue to adjust his diuretics as needed.

## 2013-11-10 ENCOUNTER — Ambulatory Visit (INDEPENDENT_AMBULATORY_CARE_PROVIDER_SITE_OTHER): Payer: Medicare Other | Admitting: Family

## 2013-11-10 DIAGNOSIS — I272 Pulmonary hypertension, unspecified: Secondary | ICD-10-CM

## 2013-11-10 DIAGNOSIS — I482 Chronic atrial fibrillation, unspecified: Secondary | ICD-10-CM

## 2013-11-10 DIAGNOSIS — Z5181 Encounter for therapeutic drug level monitoring: Secondary | ICD-10-CM | POA: Diagnosis not present

## 2013-11-10 DIAGNOSIS — I2789 Other specified pulmonary heart diseases: Secondary | ICD-10-CM | POA: Diagnosis not present

## 2013-11-10 DIAGNOSIS — H2510 Age-related nuclear cataract, unspecified eye: Secondary | ICD-10-CM | POA: Diagnosis not present

## 2013-11-10 DIAGNOSIS — I4891 Unspecified atrial fibrillation: Secondary | ICD-10-CM

## 2013-11-10 DIAGNOSIS — E119 Type 2 diabetes mellitus without complications: Secondary | ICD-10-CM | POA: Diagnosis not present

## 2013-11-10 LAB — POCT INR: INR: 2.5

## 2013-11-10 LAB — HM DIABETES EYE EXAM

## 2013-11-10 NOTE — Patient Instructions (Signed)
Continue to take 4 mg all days. Re-check in 6 week.  Anticoagulation Dose Instructions as of 11/10/2013     Eric Rivers Tue Wed Thu Fri Sat   New Dose 4 mg 4 mg 4 mg 4 mg 4 mg 4 mg 4 mg    Description       Continue to take 4 mg all days. Re-check in 6 week.

## 2013-11-11 ENCOUNTER — Encounter: Payer: Self-pay | Admitting: Internal Medicine

## 2013-11-21 DIAGNOSIS — I71019 Dissection of thoracic aorta, unspecified: Secondary | ICD-10-CM | POA: Diagnosis not present

## 2013-11-21 DIAGNOSIS — I7101 Dissection of thoracic aorta: Secondary | ICD-10-CM | POA: Diagnosis not present

## 2013-11-21 LAB — CREATININE, SERUM: Creat: 0.96 mg/dL (ref 0.50–1.35)

## 2013-11-21 LAB — BUN: BUN: 20 mg/dL (ref 6–23)

## 2013-11-22 ENCOUNTER — Other Ambulatory Visit: Payer: Self-pay | Admitting: *Deleted

## 2013-11-22 DIAGNOSIS — I7100 Dissection of unspecified site of aorta: Secondary | ICD-10-CM

## 2013-11-23 ENCOUNTER — Encounter: Payer: Self-pay | Admitting: Surgery

## 2013-11-23 ENCOUNTER — Ambulatory Visit
Admission: RE | Admit: 2013-11-23 | Discharge: 2013-11-23 | Disposition: A | Discharge status: Home / Self Care | Payer: Medicare Other | Source: Ambulatory Visit | Attending: Surgery | Admitting: Surgery

## 2013-11-23 ENCOUNTER — Ambulatory Visit (INDEPENDENT_AMBULATORY_CARE_PROVIDER_SITE_OTHER): Payer: Medicare Other | Admitting: Surgery

## 2013-11-23 VITALS — BP 133/75 | HR 60 | Ht 68.0 in | Wt 224.0 lb

## 2013-11-23 DIAGNOSIS — I7100 Dissection of unspecified site of aorta: Secondary | ICD-10-CM

## 2013-11-23 DIAGNOSIS — Z8551 Personal history of malignant neoplasm of bladder: Secondary | ICD-10-CM | POA: Diagnosis not present

## 2013-11-23 DIAGNOSIS — Z8546 Personal history of malignant neoplasm of prostate: Secondary | ICD-10-CM | POA: Diagnosis not present

## 2013-11-23 DIAGNOSIS — I77810 Thoracic aortic ectasia: Secondary | ICD-10-CM | POA: Diagnosis not present

## 2013-11-23 DIAGNOSIS — I71019 Dissection of thoracic aorta, unspecified: Secondary | ICD-10-CM

## 2013-11-23 DIAGNOSIS — I517 Cardiomegaly: Secondary | ICD-10-CM | POA: Diagnosis not present

## 2013-11-23 DIAGNOSIS — I7101 Dissection of thoracic aorta: Secondary | ICD-10-CM

## 2013-11-23 DIAGNOSIS — J984 Other disorders of lung: Secondary | ICD-10-CM | POA: Diagnosis not present

## 2013-11-23 DIAGNOSIS — K8020 Calculus of gallbladder without cholecystitis without obstruction: Secondary | ICD-10-CM | POA: Diagnosis not present

## 2013-11-23 DIAGNOSIS — N281 Cyst of kidney, acquired: Secondary | ICD-10-CM | POA: Diagnosis not present

## 2013-11-23 MED ORDER — IOHEXOL 350 MG/ML SOLN
100.0000 mL | Freq: Once | INTRAVENOUS | Status: AC | PRN
  Administered 2013-11-23: 100 mL via INTRAVENOUS

## 2013-11-23 NOTE — Progress Notes (Signed)
HPI:  The patient returns today for follow up of a type A dissection, s/p repair with a supracoronary tube graft on 04/15/2012. He has continued to do well and is still traveling around the world. He denies any chest or back pain.  Current Outpatient Prescriptions  Medication Sig Dispense Refill  . acetaminophen (TYLENOL) 500 MG tablet Take 1,000 mg by mouth 3 (three) times daily.      [name] Kitchen allopurinol (ZYLOPRIM) 300 MG tablet Take 300 mg by mouth daily.      [name] Kitchen amLODipine (NORVASC) 2.5 MG tablet Take 1 tablet (2.5 mg total) by mouth daily.  90 tablet  3  . aspirin 325 MG tablet Take 325 mg by mouth daily.      [name] Kitchen atorvastatin (LIPITOR) 20 MG tablet Take 20 mg by mouth every morning.      . Calcium Carb-Cholecalciferol (CALCIUM 600 + D PO) Take 1 tablet by mouth every morning.      [name] Kitchen desonide (DESOWEN) 0.05 % lotion Apply 1 application topically daily as needed (for face).      . finasteride (PROSCAR) 5 MG tablet Take 5 mg by mouth every morning.       . furosemide (LASIX) 40 MG tablet 1 tab by mouth in each am, and 1 tab in the PM as needed for persistent swelling  180 tablet  3  . JANUVIA 100 MG tablet take 1 tablet by mouth once daily  90 tablet  3  . ketoconazole (NIZORAL) 2 % shampoo Apply 1 application topically 4 (four) times a week.      . metoprolol tartrate (LOPRESSOR) 25 MG tablet Take 1 tablet (25 mg total) by mouth 2 (two) times daily.  180 tablet  3  . Multiple Vitamins-Minerals (ONCOVITE PO) Take 1 tablet by mouth daily.       . pantoprazole (PROTONIX) 40 MG tablet Take 40 mg by mouth daily.      . potassium chloride (K-DUR,KLOR-CON) 10 MEQ tablet Take 1 tablet (10 mEq total) by mouth daily.  90 tablet  2  . senna (SENOKOT) 8.6 MG tablet Take 1 tablet by mouth as needed for constipation.       . Tamsulosin HCl (FLOMAX) 0.4 MG CAPS Take 0.4 mg by mouth at bedtime.       . traZODone (DESYREL) 50 MG tablet Take 0.5-1 tablets (25-50 mg total) by mouth at bedtime as needed for  sleep.  90 tablet  1  . warfarin (COUMADIN) 4 MG tablet Take as directed by anticoagulation clinic  105 tablet  1   No current facility-administered medications for this visit.     Physical Exam: BP 133/75  Pulse 60  Ht 5\' 8"  (1.727 m)  Wt 224 lb (101.606 kg)  BMI 34.07 kg/m2  SpO2 95% He looks well.  Cardiac exam shows a regular rate and rhythm with normal heart sounds. There is no murmur, rub, or gallop.  Lung exam is clear.  Chest incision is well-healed and the sternum is stable.  There is no peripheral edema.   Diagnostic Tests:  CLINICAL DATA: History of aortic dissection and repair. Surgery  2014. Shortness of breath on exertion. No abdominal complaints.  History of bladder and prostate cancer.  EXAM:  CT ANGIOGRAPHY CHEST  CT ABDOMEN AND PELVIS WITH CONTRAST  TECHNIQUE:  Multidetector CT imaging of the chest was performed using the  standard protocol during bolus administration of intravenous  contrast. Multiplanar CT image reconstructions and MIPs were  obtained to evaluate the vascular anatomy. Multidetector CT imaging  of the abdomen and pelvis was performed using the standard protocol  during bolus administration of intravenous contrast.  CONTRAST: 185mL OMNIPAQUE IOHEXOL 350 MG/ML SOLN  COMPARISON: 11/24/2012 and 04/15/2012  FINDINGS:  CTA CHEST FINDINGS  Examination demonstrates evidence of patient's previous repair of  thoracic aortic dissection involving the ascending thoracic aorta  and ascending/proximal arch. There is stable dilatation of the  proximal arch measuring 5.3 cm in transverse diameter. There is  stable prominence of the descending arch/ proximal descending  thoracic aorta measuring 4.5 cm in transverse diameter. There has  been a decrease in size of the false lumen at the level of the  aortic arch and involving the descending thoracic aorta. The  dissection flap is again noted involving the right brachiocephalic  artery and subclavian  artery extending to the region of the axillary  artery, although the false lumen in this region is much smaller in  size and non opacified. The false lumen of the distal thoracic  aortic dissection becomes opacified approximately 5 cm above the  diaphragm unchanged.  There is stable cardiomegaly. Sternotomy wires are present. There is  moderate calcified plaque over the coronary arteries. There is no  significant hilar, mediastinal or axillary adenopathy. Lungs are  adequately inflated without consolidation or effusion. There is  minimal linear atelectasis/ scarring over the lingula. There is a  sub solid 6-7 mm nodular density adjacent the right major fissure  unchanged.  Review of the MIP images confirms the above findings.  CT ABDOMEN and PELVIS FINDINGS  Patient's aortic dissection extends into the abdominal aorta to the  level of the aortic bifurcation and into the left common iliac  artery unchanged. The false lumen is located anteriorly and is well  opacified giving rise to well opacified celiac axis, superior  mesenteric and inferior mesenteric arteries. One of 2 right renal  arteries also originates from the false lumen and is well opacified.  A second right renal artery as well as the left renal artery  originate from the posterior true lumen as these findings are  unchanged. No evidence of aneurysmal dilatation of the abdominal  aorta. There is mild calcified plaque involving the abdominal aorta  and iliac arteries. No hemodynamically significant stenosis or  occlusions are noted.  There is a somewhat nodular contour to the liver. Mild  cholelithiasis is present. The spleen, pancreas and adrenal glands  are within normal. Kidneys normal in size without hydronephrosis or  nephrolithiasis. Ureters are within normal. There is a 1.2 cm cyst  over the upper pole of the right kidney unchanged. The appendix is  normal.  Pelvic images demonstrate a 4 mm calcification along the  posterior  aspect of the bladder. Metallic fiducials are present over the  region of the prostate gland. Rectum is within normal. There is no  evidence of adenopathy. Multiple phleboliths are present. There are  degenerative changes of the spine and hips.  Review of the MIP images confirms the above findings.  IMPRESSION:  Evidence of patient's ascending thoracic aorta/ proximal arch  dissection repair with interval decrease in size of the false lumen  over the mid to distal aortic arch and descending thoracic aorta.  Dissection flap again seen extending into the right brachiocephalic  artery, subclavian and axillary artery although the false lumen is  smaller and opacified.  Stable dilatation of the proximal aortic arch measuring 5.3 cm in  transverse diameter.  Continued evidence  of extension of patient's dissection flap  throughout the length of the abdominal aorta into the left common  iliac artery without significant change. Celiac axis, mesenteric  arteries and 1 of 2 right renal arteries originate from the  opacified anterior false lumen. No significant stenosis or  occlusions. No significant aneurysmal dilatation of the abdominal  aorta.  Sub solid 6-7 mm nodular density adjacent the right major fissure  are unchanged. Recommend followup CT 1 year. This recommendation  follows the consensus statement: Guidelines for Management of Small  Pulmonary Nodules Detected on CT Scans: A Statement from the  [employer] as published in Radiology 2005; 237:395-400.  Online at: https://www.arnold.com/.  Cholelithiasis. 1.2 cm right renal cyst. Stable nodular contour to  the liver. 4 mm nonspecific calcification along the posterior wall  of the bladder. Cardiomegaly.  Electronically Signed  By: Marin Olp M.D.  On: 11/23/2013 11:05         Impression:  The aortic dissection looks stable with no change in the size of the aortic arch which is  moderately aneurysmal. The false lumen in the descending aorta is thrombosed and significantly smaller with remodeling of the aorta. There is a stable 6-7 mm nodule in the right major fissure. Overall I think things look stable to improved with no progressive aneurysmal change in the aorta. I have stressed the importance of continued good BP control.  Plan:  I will see him back in 1 year with a CTA of the chest and abdomen.

## 2013-12-19 ENCOUNTER — Other Ambulatory Visit: Payer: Self-pay

## 2013-12-19 MED ORDER — PANTOPRAZOLE SODIUM 40 MG PO TBEC: 40.0000 mg | DELAYED_RELEASE_TABLET | Freq: Every day | ORAL | Status: DC

## 2013-12-19 MED ORDER — ATORVASTATIN CALCIUM 20 MG PO TABS: 20.0000 mg | ORAL_TABLET | Freq: Every morning | ORAL | Status: DC

## 2013-12-19 MED ORDER — ALLOPURINOL 300 MG PO TABS: 300.0000 mg | ORAL_TABLET | Freq: Every day | ORAL | Status: DC

## 2013-12-22 ENCOUNTER — Ambulatory Visit: Payer: BC Managed Care – PPO

## 2013-12-22 ENCOUNTER — Ambulatory Visit (INDEPENDENT_AMBULATORY_CARE_PROVIDER_SITE_OTHER): Payer: Medicare Other | Admitting: Family

## 2013-12-22 DIAGNOSIS — I482 Chronic atrial fibrillation, unspecified: Secondary | ICD-10-CM

## 2013-12-22 DIAGNOSIS — I739 Peripheral vascular disease, unspecified: Secondary | ICD-10-CM | POA: Diagnosis not present

## 2013-12-22 DIAGNOSIS — E1151 Type 2 diabetes mellitus with diabetic peripheral angiopathy without gangrene: Secondary | ICD-10-CM | POA: Diagnosis not present

## 2013-12-22 DIAGNOSIS — Z5181 Encounter for therapeutic drug level monitoring: Secondary | ICD-10-CM

## 2013-12-22 DIAGNOSIS — L603 Nail dystrophy: Secondary | ICD-10-CM | POA: Diagnosis not present

## 2013-12-22 DIAGNOSIS — I272 Pulmonary hypertension, unspecified: Secondary | ICD-10-CM

## 2013-12-22 DIAGNOSIS — I270 Primary pulmonary hypertension: Secondary | ICD-10-CM | POA: Diagnosis not present

## 2013-12-22 LAB — POCT INR: INR: 3.1

## 2013-12-22 NOTE — Patient Instructions (Signed)
Take 1/2 tab today only. Then Ccntinue to take 4 mg all days. Re-check in 6 week.  Anticoagulation Dose Instructions as of 12/22/2013     Dorene Grebe Tue Wed Thu Fri Sat   New Dose 4 mg 4 mg 4 mg 4 mg 4 mg 4 mg 4 mg    Description       Take 1/2 tab today only. Then Ccntinue to take 4 mg all days. Re-check in 6 week.

## 2013-12-23 ENCOUNTER — Other Ambulatory Visit: Payer: Self-pay

## 2013-12-23 MED ORDER — FINASTERIDE 5 MG PO TABS: 5.0000 mg | ORAL_TABLET | Freq: Every morning | ORAL | Status: AC

## 2013-12-28 ENCOUNTER — Inpatient Hospital Stay (HOSPITAL_COMMUNITY)
Admission: EM | Admit: 2013-12-28 | Discharge: 2014-01-01 | DRG: 378 | Disposition: A | Discharge status: Home / Self Care | Payer: Medicare Other | Attending: Internal Medicine | Admitting: Internal Medicine

## 2013-12-28 ENCOUNTER — Encounter (HOSPITAL_COMMUNITY): Payer: Self-pay | Admitting: Emergency Medicine

## 2013-12-28 DIAGNOSIS — L309 Dermatitis, unspecified: Secondary | ICD-10-CM | POA: Clinically undetermined

## 2013-12-28 DIAGNOSIS — I5032 Chronic diastolic (congestive) heart failure: Secondary | ICD-10-CM | POA: Diagnosis present

## 2013-12-28 DIAGNOSIS — Z8551 Personal history of malignant neoplasm of bladder: Secondary | ICD-10-CM

## 2013-12-28 DIAGNOSIS — I10 Essential (primary) hypertension: Secondary | ICD-10-CM | POA: Diagnosis present

## 2013-12-28 DIAGNOSIS — E780 Pure hypercholesterolemia: Secondary | ICD-10-CM | POA: Diagnosis present

## 2013-12-28 DIAGNOSIS — I482 Chronic atrial fibrillation, unspecified: Secondary | ICD-10-CM | POA: Diagnosis present

## 2013-12-28 DIAGNOSIS — I272 Other secondary pulmonary hypertension: Secondary | ICD-10-CM | POA: Diagnosis present

## 2013-12-28 DIAGNOSIS — K5731 Diverticulosis of large intestine without perforation or abscess with bleeding: Secondary | ICD-10-CM | POA: Diagnosis not present

## 2013-12-28 DIAGNOSIS — T45515A Adverse effect of anticoagulants, initial encounter: Secondary | ICD-10-CM | POA: Diagnosis present

## 2013-12-28 DIAGNOSIS — K922 Gastrointestinal hemorrhage, unspecified: Secondary | ICD-10-CM

## 2013-12-28 DIAGNOSIS — M5136 Other intervertebral disc degeneration, lumbar region: Secondary | ICD-10-CM | POA: Diagnosis present

## 2013-12-28 DIAGNOSIS — D62 Acute posthemorrhagic anemia: Secondary | ICD-10-CM | POA: Diagnosis not present

## 2013-12-28 DIAGNOSIS — F1020 Alcohol dependence, uncomplicated: Secondary | ICD-10-CM | POA: Diagnosis present

## 2013-12-28 DIAGNOSIS — E1140 Type 2 diabetes mellitus with diabetic neuropathy, unspecified: Secondary | ICD-10-CM | POA: Diagnosis present

## 2013-12-28 DIAGNOSIS — Z923 Personal history of irradiation: Secondary | ICD-10-CM

## 2013-12-28 DIAGNOSIS — D500 Iron deficiency anemia secondary to blood loss (chronic): Secondary | ICD-10-CM | POA: Diagnosis present

## 2013-12-28 DIAGNOSIS — I350 Nonrheumatic aortic (valve) stenosis: Secondary | ICD-10-CM | POA: Diagnosis present

## 2013-12-28 DIAGNOSIS — M109 Gout, unspecified: Secondary | ICD-10-CM | POA: Diagnosis present

## 2013-12-28 DIAGNOSIS — I340 Nonrheumatic mitral (valve) insufficiency: Secondary | ICD-10-CM | POA: Diagnosis present

## 2013-12-28 DIAGNOSIS — K625 Hemorrhage of anus and rectum: Secondary | ICD-10-CM

## 2013-12-28 DIAGNOSIS — Z7901 Long term (current) use of anticoagulants: Secondary | ICD-10-CM | POA: Diagnosis not present

## 2013-12-28 DIAGNOSIS — R791 Abnormal coagulation profile: Secondary | ICD-10-CM | POA: Diagnosis present

## 2013-12-28 DIAGNOSIS — Z87891 Personal history of nicotine dependence: Secondary | ICD-10-CM

## 2013-12-28 DIAGNOSIS — K219 Gastro-esophageal reflux disease without esophagitis: Secondary | ICD-10-CM | POA: Diagnosis present

## 2013-12-28 DIAGNOSIS — C61 Malignant neoplasm of prostate: Secondary | ICD-10-CM | POA: Diagnosis present

## 2013-12-28 DIAGNOSIS — D649 Anemia, unspecified: Secondary | ICD-10-CM | POA: Diagnosis present

## 2013-12-28 DIAGNOSIS — Z7982 Long term (current) use of aspirin: Secondary | ICD-10-CM | POA: Diagnosis not present

## 2013-12-28 DIAGNOSIS — I270 Primary pulmonary hypertension: Secondary | ICD-10-CM | POA: Diagnosis not present

## 2013-12-28 DIAGNOSIS — D6832 Hemorrhagic disorder due to extrinsic circulating anticoagulants: Secondary | ICD-10-CM

## 2013-12-28 HISTORY — DX: Diverticulosis of large intestine without perforation or abscess with bleeding: K57.31

## 2013-12-28 LAB — COMPREHENSIVE METABOLIC PANEL
ALK PHOS: 88 U/L (ref 39–117)
ALT: 15 U/L (ref 0–53)
ANION GAP: 15 (ref 5–15)
AST: 26 U/L (ref 0–37)
Albumin: 4 g/dL (ref 3.5–5.2)
BUN: 21 mg/dL (ref 6–23)
CO2: 27 meq/L (ref 19–32)
Calcium: 9.4 mg/dL (ref 8.4–10.5)
Chloride: 96 mEq/L (ref 96–112)
Creatinine, Ser: 1.03 mg/dL (ref 0.50–1.35)
GFR calc non Af Amer: 66 mL/min — ABNORMAL LOW (ref >90)
GFR, EST AFRICAN AMERICAN: 77 mL/min — AB (ref >90)
Glucose, Bld: 124 mg/dL — ABNORMAL HIGH (ref 70–99)
POTASSIUM: 4.2 meq/L (ref 3.7–5.3)
SODIUM: 138 meq/L (ref 137–147)
TOTAL PROTEIN: 7.5 g/dL (ref 6.0–8.3)
Total Bilirubin: 1.1 mg/dL (ref 0.3–1.2)

## 2013-12-28 LAB — I-STAT CG4 LACTIC ACID, ED: Lactic Acid, Venous: 1.62 mmol/L (ref 0.5–2.2)

## 2013-12-28 LAB — TYPE AND SCREEN
ABO/RH(D): O POS
Antibody Screen: NEGATIVE

## 2013-12-28 LAB — ABO/RH: ABO/RH(D): O POS

## 2013-12-28 LAB — PROTIME-INR
INR: 3.04 — ABNORMAL HIGH (ref 0.00–1.49)
Prothrombin Time: 31.7 seconds — ABNORMAL HIGH (ref 11.6–15.2)

## 2013-12-28 LAB — CBC
HCT: 32.1 % — ABNORMAL LOW (ref 39.0–52.0)
Hemoglobin: 10.5 g/dL — ABNORMAL LOW (ref 13.0–17.0)
MCH: 29.9 pg (ref 26.0–34.0)
MCHC: 32.7 g/dL (ref 30.0–36.0)
MCV: 91.5 fL (ref 78.0–100.0)
Platelets: 207 10*3/uL (ref 150–400)
RBC: 3.51 MIL/uL — ABNORMAL LOW (ref 4.22–5.81)
RDW: 16.3 % — ABNORMAL HIGH (ref 11.5–15.5)
WBC: 8.1 10*3/uL (ref 4.0–10.5)

## 2013-12-28 LAB — POC OCCULT BLOOD, ED: Fecal Occult Bld: POSITIVE — AB

## 2013-12-28 NOTE — ED Notes (Signed)
Patient reports two episodes of rectal bleeding x1 day. Patient is on Coumadin for Hx of A Fib. Patient denies N/V, lightheadedness, dizziness, pain.

## 2013-12-28 NOTE — ED Provider Notes (Signed)
CSN: [id number]     Arrival date & time 12/28/13  2019 History   First MD Initiated Contact with Patient 12/28/13 2052     Chief Complaint  Patient presents with  . Rectal Bleeding     (Consider location/radiation/quality/duration/timing/severity/associated sxs/prior Treatment) Patient is a 80 y.o. male presenting with hematochezia. The history is provided by the patient.  Rectal Bleeding Quality:  Maroon Amount:  Moderate Timing:  Intermittent Progression:  Unchanged Chronicity:  New Context: defecation and spontaneously   Similar prior episodes: no   Relieved by:  Nothing Worsened by:  Nothing tried Associated symptoms: vomiting   Associated symptoms: no abdominal pain, no dizziness and no fever     Past Medical History  Diagnosis Date  . Whooping cough   . Atrial fibrillation      Chronic,   24 hour holter, September, 2011.... atrial fib rate is controlled.... there is some bradycardia but  no marked pauses  . Diabetes mellitus     type 2  . Hypertension   . DDD (degenerative disc disease)     lumbar spine  . GERD (gastroesophageal reflux disease)   . Gout   . Fluid overload   . ACE-inhibitor cough   . Shortness of breath     on exertion  . Aortic stenosis     mild....echo... september... 2010/ mild... echo.[name] KitchenMarland KitchenDec, 2011  . Pulmonary hypertension     60mmHg, echo, 01/2010  . Hyperlipidemia   . Hx of colonoscopy     approx. 10 years  . Warfarin anticoagulation     Atrial fib  . Ejection fraction     EF 60%, echo, 01/2010  . Normal nuclear stress test     06/2005 , also ABI normal 2007  . Hypercholesterolemia   . Fracture of metacarpal bone 11/30/11  . Aortic dissection     Surgical repair February, 2014  . Mitral regurgitation   . Bladder cancer     recurrence with TUR-B March '09  . Prostate cancer     radiation tx.  . Diabetes mellitus with neuropathy 12/04/2006    Qualifier: Diagnosis of  By: Linda Hedges MD, Heinz Knuckles  medications - DPP4    Past Surgical  History  Procedure Laterality Date  . Lumbar laminectomy      '02  . Tur-bt '06, '09    . Bladder cancer biopsy  10/16/2010    negative for malignancy  . Bladder microscopic  04/13/2007    high grade papillary urothelial lesions  . 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 Prostate biopsy  09/25/2011    GLEASON 3+3=6 AND 4+3=7  . Ascending aortic root replacement N/A 04/15/2012    Procedure: ASCENDING AORTIC ROOT REPLACEMENT;  Surgeon: Gaye Pollack, MD;  Location: [facility];  Service: Open Heart Surgery;  Laterality: N/A;  . Tee without cardioversion N/A 04/15/2012    Procedure: TRANSESOPHAGEAL ECHOCARDIOGRAM (TEE);  Surgeon: Gaye Pollack, MD;  Location: [facility];  Service: Open Heart Surgery;  Laterality: N/A;  . Exploration post operative open heart  04/2012  . Cystoscopy/retrograde/ureteroscopy Bilateral 04/04/2013    Procedure: CYSTOSCOPY WITH RETROGRADE PYELOGRAM AND BLADDER BIOPSY;  Surgeon: Molli Hazard, MD;  Location: [facility];  Service: Urology;  Laterality: Bilateral;  . Herniatic disc surgery     Family History  Problem Relation Age of Onset  . Prostate cancer Father 64    passed with prostate ca  . Cancer Father   .  Stroke Father   . Heart disease Mother   . Hypertension Mother    History  Substance Use Topics  . Smoking status: Former Smoker -- 1.00 packs/day    Types: Cigarettes    Quit date: 03/04/1975  . Smokeless tobacco: Never Used  . Alcohol Use: 7.0 oz/week    14 drink(s) per week     Comment: liquor daily    Review of Systems  Constitutional: Negative for fever and chills.  Respiratory: Negative for cough and shortness of breath.   Cardiovascular: Negative for chest pain and leg swelling.  Gastrointestinal: Positive for vomiting, blood in stool and hematochezia. Negative for abdominal pain.  Neurological: Negative for dizziness.  All other systems reviewed and are negative.     Allergies  Review of patient's  allergies indicates no known allergies.  Home Medications   Prior to Admission medications   Medication Sig Start Date End Date Taking? Authorizing Provider  acetaminophen (TYLENOL) 500 MG tablet Take 1,000 mg by mouth 3 (three) times daily.   Yes Historical Provider, MD  allopurinol (ZYLOPRIM) 300 MG tablet Take 1 tablet (300 mg total) by mouth daily. 12/19/13  Yes Biagio Borg, MD  amLODipine (NORVASC) 2.5 MG tablet Take 1 tablet (2.5 mg total) by mouth daily. 07/20/13  Yes Carlena Bjornstad, MD  aspirin 325 MG tablet Take 325 mg by mouth daily.   Yes Historical Provider, MD  atorvastatin (LIPITOR) 20 MG tablet Take 1 tablet (20 mg total) by mouth every morning. 12/19/13  Yes Biagio Borg, MD  Calcium Carb-Cholecalciferol (CALCIUM 600 + D PO) Take 1 tablet by mouth every morning.   Yes Historical Provider, MD  desonide (DESOWEN) 0.05 % lotion Apply 1 application topically daily as needed (for face).   Yes Historical Provider, MD  finasteride (PROSCAR) 5 MG tablet Take 1 tablet (5 mg total) by mouth every morning. 12/23/13  Yes Biagio Borg, MD  furosemide (LASIX) 40 MG tablet Take 40 mg by mouth daily.   Yes Historical Provider, MD  JANUVIA 100 MG tablet take 1 tablet by mouth once daily 10/03/13  Yes Biagio Borg, MD  ketoconazole (NIZORAL) 2 % shampoo Apply 1 application topically 4 (four) times a week.   Yes Historical Provider, MD  metoprolol tartrate (LOPRESSOR) 25 MG tablet Take 1 tablet (25 mg total) by mouth 2 (two) times daily. 09/27/13  Yes Biagio Borg, MD  Multiple Vitamins-Minerals (ONCOVITE PO) Take 1 tablet by mouth daily.    Yes Historical Provider, MD  pantoprazole (PROTONIX) 40 MG tablet Take 1 tablet (40 mg total) by mouth daily. 12/19/13  Yes Biagio Borg, MD  potassium chloride (K-DUR,KLOR-CON) 10 MEQ tablet Take 1 tablet (10 mEq total) by mouth daily. 10/03/13  Yes Carlena Bjornstad, MD  Tamsulosin HCl (FLOMAX) 0.4 MG CAPS Take 0.4 mg by mouth at bedtime.    Yes Historical Provider,  MD  traZODone (DESYREL) 25 mg TABS tablet Take 25 mg by mouth at bedtime.   Yes Historical Provider, MD  warfarin (COUMADIN) 4 MG tablet Take as directed by anticoagulation clinic 09/16/13  Yes Biagio Borg, MD   BP 161/73  Pulse 71  Temp(Src) 98.3 F (36.8 C) (Oral)  Resp 18  SpO2 94% Physical Exam  Nursing note and vitals reviewed. Constitutional: He is oriented to person, place, and time. He appears well-developed and well-nourished. No distress.  HENT:  Head: Normocephalic and atraumatic.  Mouth/Throat: No oropharyngeal exudate.  Eyes: EOM are normal.  Pupils are equal, round, and reactive to light.  Neck: Normal range of motion. Neck supple.  Cardiovascular: Normal rate and regular rhythm.  Exam reveals no friction rub.   No murmur heard. Pulmonary/Chest: Effort normal and breath sounds normal. No respiratory distress. He has no wheezes. He has no rales.  Abdominal: He exhibits no distension. There is no tenderness. There is no rebound.  Genitourinary: Guaiac positive stool.  Musculoskeletal: Normal range of motion. He exhibits no edema.  Neurological: He is alert and oriented to person, place, and time.  Skin: He is not diaphoretic.    ED Course  Procedures (including critical care time) Labs Review Labs Reviewed  POC OCCULT BLOOD, ED - Abnormal; Notable for the following:    Fecal Occult Bld POSITIVE (*)    All other components within normal limits  CBC  COMPREHENSIVE METABOLIC PANEL  PROTIME-INR  I-STAT CG4 LACTIC ACID, ED  POC OCCULT BLOOD, ED  TYPE AND SCREEN    Imaging Review No results found.   EKG Interpretation None      MDM   Final diagnoses:  Rectal bleeding    80M presents with rectal bleeding. No prior episodes. Had 2 episodes today, nonpainful. Is on coumadin for Afib. Denies dizziness, CP, SOB.  On exam, blood mixed with stool. Abdominal exam benign.  Will check labs. Labs ok. Hemoglobin stable. Lactate normal. INR 3. I spoke with Dr.  Fuller Plan with GI, who will see patient in the morning. Admitted to medicine.  Evelina Bucy, MD 12/28/13 [phone]

## 2013-12-29 ENCOUNTER — Encounter (HOSPITAL_COMMUNITY): Payer: Self-pay | Admitting: Internal Medicine

## 2013-12-29 DIAGNOSIS — D699 Hemorrhagic condition, unspecified: Secondary | ICD-10-CM

## 2013-12-29 DIAGNOSIS — T45515A Adverse effect of anticoagulants, initial encounter: Secondary | ICD-10-CM

## 2013-12-29 DIAGNOSIS — I270 Primary pulmonary hypertension: Secondary | ICD-10-CM

## 2013-12-29 DIAGNOSIS — D649 Anemia, unspecified: Secondary | ICD-10-CM | POA: Diagnosis present

## 2013-12-29 DIAGNOSIS — I482 Chronic atrial fibrillation: Secondary | ICD-10-CM

## 2013-12-29 DIAGNOSIS — E1140 Type 2 diabetes mellitus with diabetic neuropathy, unspecified: Secondary | ICD-10-CM

## 2013-12-29 DIAGNOSIS — K625 Hemorrhage of anus and rectum: Secondary | ICD-10-CM

## 2013-12-29 DIAGNOSIS — D6832 Hemorrhagic disorder due to extrinsic circulating anticoagulants: Secondary | ICD-10-CM | POA: Diagnosis present

## 2013-12-29 DIAGNOSIS — D62 Acute posthemorrhagic anemia: Secondary | ICD-10-CM

## 2013-12-29 DIAGNOSIS — K922 Gastrointestinal hemorrhage, unspecified: Secondary | ICD-10-CM

## 2013-12-29 LAB — CBC
HCT: 32.8 % — ABNORMAL LOW (ref 39.0–52.0)
HCT: 30.3 % — ABNORMAL LOW (ref 39.0–52.0)
HCT: 31.6 % — ABNORMAL LOW (ref 39.0–52.0)
HEMATOCRIT: 33.6 % — AB (ref 39.0–52.0)
HEMATOCRIT: 29.8 % — AB (ref 39.0–52.0)
HEMOGLOBIN: 9.6 g/dL — AB (ref 13.0–17.0)
HEMOGLOBIN: 10.2 g/dL — AB (ref 13.0–17.0)
Hemoglobin: 10.4 g/dL — ABNORMAL LOW (ref 13.0–17.0)
Hemoglobin: 10.7 g/dL — ABNORMAL LOW (ref 13.0–17.0)
Hemoglobin: 9.6 g/dL — ABNORMAL LOW (ref 13.0–17.0)
MCH: 29.6 pg (ref 26.0–34.0)
MCH: 29.7 pg (ref 26.0–34.0)
MCH: 29.4 pg (ref 26.0–34.0)
MCH: 29.8 pg (ref 26.0–34.0)
MCH: 29.9 pg (ref 26.0–34.0)
MCHC: 31.7 g/dL (ref 30.0–36.0)
MCHC: 31.7 g/dL (ref 30.0–36.0)
MCHC: 31.8 g/dL (ref 30.0–36.0)
MCHC: 32.2 g/dL (ref 30.0–36.0)
MCHC: 32.3 g/dL (ref 30.0–36.0)
MCV: 93.4 fL (ref 78.0–100.0)
MCV: 93.8 fL (ref 78.0–100.0)
MCV: 92.3 fL (ref 78.0–100.0)
MCV: 92.5 fL (ref 78.0–100.0)
MCV: 92.7 fL (ref 78.0–100.0)
PLATELETS: 201 10*3/uL (ref 150–400)
PLATELETS: 181 10*3/uL (ref 150–400)
PLATELETS: 180 10*3/uL (ref 150–400)
Platelets: 200 10*3/uL (ref 150–400)
Platelets: 175 10*3/uL (ref 150–400)
RBC: 3.51 MIL/uL — ABNORMAL LOW (ref 4.22–5.81)
RBC: 3.23 MIL/uL — AB (ref 4.22–5.81)
RBC: 3.64 MIL/uL — AB (ref 4.22–5.81)
RBC: 3.22 MIL/uL — AB (ref 4.22–5.81)
RBC: 3.41 MIL/uL — ABNORMAL LOW (ref 4.22–5.81)
RDW: 16.6 % — AB (ref 11.5–15.5)
RDW: 16.6 % — ABNORMAL HIGH (ref 11.5–15.5)
RDW: 16.4 % — ABNORMAL HIGH (ref 11.5–15.5)
RDW: 16.4 % — ABNORMAL HIGH (ref 11.5–15.5)
RDW: 16.5 % — ABNORMAL HIGH (ref 11.5–15.5)
WBC: 8.6 10*3/uL (ref 4.0–10.5)
WBC: 7.9 10*3/uL (ref 4.0–10.5)
WBC: 8.4 10*3/uL (ref 4.0–10.5)
WBC: 6.8 10*3/uL (ref 4.0–10.5)
WBC: 7 10*3/uL (ref 4.0–10.5)

## 2013-12-29 LAB — COMPREHENSIVE METABOLIC PANEL
ALK PHOS: 77 U/L (ref 39–117)
ALT: 13 U/L (ref 0–53)
ANION GAP: 11 (ref 5–15)
AST: 23 U/L (ref 0–37)
Albumin: 3.6 g/dL (ref 3.5–5.2)
BUN: 18 mg/dL (ref 6–23)
CHLORIDE: 102 meq/L (ref 96–112)
CO2: 29 meq/L (ref 19–32)
Calcium: 9.1 mg/dL (ref 8.4–10.5)
Creatinine, Ser: 1.03 mg/dL (ref 0.50–1.35)
GFR calc Af Amer: 77 mL/min — ABNORMAL LOW (ref >90)
GFR, EST NON AFRICAN AMERICAN: 66 mL/min — AB (ref >90)
GLUCOSE: 104 mg/dL — AB (ref 70–99)
Potassium: 3.8 mEq/L (ref 3.7–5.3)
SODIUM: 142 meq/L (ref 137–147)
Total Bilirubin: 1.5 mg/dL — ABNORMAL HIGH (ref 0.3–1.2)
Total Protein: 6.7 g/dL (ref 6.0–8.3)

## 2013-12-29 LAB — GLUCOSE, CAPILLARY
GLUCOSE-CAPILLARY: 121 mg/dL — AB (ref 70–99)
GLUCOSE-CAPILLARY: 132 mg/dL — AB (ref 70–99)
Glucose-Capillary: 121 mg/dL — ABNORMAL HIGH (ref 70–99)
Glucose-Capillary: 172 mg/dL — ABNORMAL HIGH (ref 70–99)

## 2013-12-29 LAB — PROTIME-INR
INR: 2.97 — ABNORMAL HIGH (ref 0.00–1.49)
Prothrombin Time: 31.2 seconds — ABNORMAL HIGH (ref 11.6–15.2)

## 2013-12-29 MED ORDER — ONDANSETRON HCL 4 MG PO TABS: 4.0000 mg | ORAL_TABLET | Freq: Four times a day (QID) | ORAL | Status: DC | PRN

## 2013-12-29 MED ORDER — FOLIC ACID 1 MG PO TABS
1.0000 mg | ORAL_TABLET | Freq: Every day | ORAL | Status: DC
  Administered 2013-12-29 – 2014-01-01 (×4): 1 mg via ORAL
  Filled 2013-12-29 (×4): qty 1

## 2013-12-29 MED ORDER — ADULT MULTIVITAMIN W/MINERALS CH
1.0000 | ORAL_TABLET | Freq: Every day | ORAL | Status: DC
  Administered 2013-12-29 – 2014-01-01 (×4): 1 via ORAL
  Filled 2013-12-29 (×4): qty 1

## 2013-12-29 MED ORDER — INSULIN ASPART 100 UNIT/ML ~~LOC~~ SOLN
0.0000 [IU] | Freq: Three times a day (TID) | SUBCUTANEOUS | Status: DC
  Administered 2013-12-29 – 2014-01-01 (×5): 1 [IU] via SUBCUTANEOUS

## 2013-12-29 MED ORDER — ACETAMINOPHEN 650 MG RE SUPP: 650.0000 mg | Freq: Four times a day (QID) | RECTAL | Status: DC | PRN

## 2013-12-29 MED ORDER — LORAZEPAM 2 MG/ML IJ SOLN
1.0000 mg | Freq: Four times a day (QID) | INTRAMUSCULAR | Status: AC | PRN
  Administered 2013-12-29: 02:00:00 via INTRAVENOUS
  Filled 2013-12-29: qty 1

## 2013-12-29 MED ORDER — LORAZEPAM 2 MG/ML IJ SOLN: 0.0000 mg | Freq: Two times a day (BID) | INTRAMUSCULAR | Status: DC

## 2013-12-29 MED ORDER — TAMSULOSIN HCL 0.4 MG PO CAPS
0.4000 mg | ORAL_CAPSULE | Freq: Every day | ORAL | Status: DC
  Administered 2013-12-29 – 2013-12-31 (×4): 0.4 mg via ORAL
  Filled 2013-12-29 (×5): qty 1

## 2013-12-29 MED ORDER — FINASTERIDE 5 MG PO TABS
5.0000 mg | ORAL_TABLET | Freq: Every morning | ORAL | Status: DC
  Administered 2013-12-29 – 2014-01-01 (×4): 5 mg via ORAL
  Filled 2013-12-29 (×4): qty 1

## 2013-12-29 MED ORDER — ACETAMINOPHEN 325 MG PO TABS: 650.0000 mg | ORAL_TABLET | Freq: Four times a day (QID) | ORAL | Status: DC | PRN

## 2013-12-29 MED ORDER — ONDANSETRON HCL 4 MG/2ML IJ SOLN: 4.0000 mg | Freq: Four times a day (QID) | INTRAMUSCULAR | Status: DC | PRN

## 2013-12-29 MED ORDER — TRAZODONE 25 MG HALF TABLET
25.0000 mg | ORAL_TABLET | Freq: Every day | ORAL | Status: DC
  Administered 2013-12-29 – 2013-12-31 (×4): 25 mg via ORAL
  Filled 2013-12-29 (×5): qty 1

## 2013-12-29 MED ORDER — LORAZEPAM 2 MG/ML IJ SOLN: 0.0000 mg | Freq: Four times a day (QID) | INTRAMUSCULAR | Status: AC

## 2013-12-29 MED ORDER — ALLOPURINOL 300 MG PO TABS
300.0000 mg | ORAL_TABLET | Freq: Every day | ORAL | Status: DC
  Administered 2013-12-29 – 2014-01-01 (×4): 300 mg via ORAL
  Filled 2013-12-29 (×4): qty 1

## 2013-12-29 MED ORDER — PANTOPRAZOLE SODIUM 40 MG IV SOLR: 40.0000 mg | Freq: Two times a day (BID) | INTRAVENOUS | Status: DC

## 2013-12-29 MED ORDER — PANTOPRAZOLE SODIUM 40 MG IV SOLR
40.0000 mg | Freq: Two times a day (BID) | INTRAVENOUS | Status: DC
  Administered 2013-12-29 – 2014-01-01 (×7): 40 mg via INTRAVENOUS
  Filled 2013-12-29 (×8): qty 40

## 2013-12-29 MED ORDER — THIAMINE HCL 100 MG/ML IJ SOLN
100.0000 mg | Freq: Every day | INTRAMUSCULAR | Status: DC
  Filled 2013-12-29 (×4): qty 1

## 2013-12-29 MED ORDER — VITAMIN B-1 100 MG PO TABS
100.0000 mg | ORAL_TABLET | Freq: Every day | ORAL | Status: DC
  Administered 2013-12-29 – 2014-01-01 (×4): 100 mg via ORAL
  Filled 2013-12-29 (×4): qty 1

## 2013-12-29 MED ORDER — ATORVASTATIN CALCIUM 20 MG PO TABS
20.0000 mg | ORAL_TABLET | Freq: Every day | ORAL | Status: DC
  Administered 2013-12-29 – 2013-12-31 (×3): 20 mg via ORAL
  Filled 2013-12-29 (×4): qty 1

## 2013-12-29 MED ORDER — METOPROLOL TARTRATE 25 MG PO TABS
25.0000 mg | ORAL_TABLET | Freq: Two times a day (BID) | ORAL | Status: DC
  Administered 2013-12-29 – 2014-01-01 (×8): 25 mg via ORAL
  Filled 2013-12-29 (×9): qty 1

## 2013-12-29 MED ORDER — AMLODIPINE BESYLATE 2.5 MG PO TABS
2.5000 mg | ORAL_TABLET | Freq: Every day | ORAL | Status: DC
  Administered 2013-12-29 – 2014-01-01 (×4): 2.5 mg via ORAL
  Filled 2013-12-29 (×4): qty 1

## 2013-12-29 MED ORDER — SODIUM CHLORIDE 0.9 % IJ SOLN
3.0000 mL | Freq: Two times a day (BID) | INTRAMUSCULAR | Status: DC
  Administered 2013-12-29 – 2014-01-01 (×7): 3 mL via INTRAVENOUS

## 2013-12-29 MED ORDER — SODIUM CHLORIDE 0.9 % IV SOLN
8.0000 mg/h | INTRAVENOUS | Status: DC
  Administered 2013-12-29: 8 mg/h via INTRAVENOUS
  Filled 2013-12-29 (×2): qty 80

## 2013-12-29 MED ORDER — LORAZEPAM 1 MG PO TABS: 1.0000 mg | ORAL_TABLET | Freq: Four times a day (QID) | ORAL | Status: AC | PRN

## 2013-12-29 MED ORDER — PANTOPRAZOLE SODIUM 40 MG IV SOLR
80.0000 mg | Freq: Once | INTRAVENOUS | Status: AC
  Administered 2013-12-29: 80 mg via INTRAVENOUS
  Filled 2013-12-29: qty 80

## 2013-12-29 NOTE — H&P (Signed)
[facility] History and Physical  CAYSEN WHANG RSW:[id number] DOB: May 20, 1930 DOA: 12/28/2013  Referring physician: ER physician. PCP: Cathlean Cower, MD   Chief Complaint: Rectal bleeding.  HPI: GWEN EDLER is a 80 y.o. male with known history of chronic atrial fibrillation on Coumadin, history of ascending aortic dissection status post surgery, chronic alcoholism, CHF, diabetes mellitus, hypertension was brought to the ER after patient had 2 episodes of rectal bleeding last evening. Patient stated that during the first episode patient had a small clot of blood passed with mixed stools. After a couple of hours patient had an episode at this time patient has less blood than before. Patient denies any abdominal pain nausea vomiting fever chills. Patient has never had previous episodes of rectal bleeding. Patient states that over the last 10 days patient has been drinking more alcohol than usual. Denies any associated dizziness weakness or fatigue. In the ER patient was found to be hemodynamically stable and creatinine was around patient's baseline. Patient has been admitted for further management for rectal bleeding. Patient denies taking any NSAIDs the patient does take aspirin.   Review of Systems: As presented in the history of presenting illness, rest negative.  Past Medical History  Diagnosis Date  . Whooping cough   . Atrial fibrillation      Chronic,   24 hour holter, September, 2011.... atrial fib rate is controlled.... there is some bradycardia but  no marked pauses  . Diabetes mellitus     type 2  . Hypertension   . DDD (degenerative disc disease)     lumbar spine  . GERD (gastroesophageal reflux disease)   . Gout   . Fluid overload   . ACE-inhibitor cough   . Shortness of breath     on exertion  . Aortic stenosis     mild....echo... september... 2010/ mild... echo.[name] KitchenMarland KitchenDec, 2011  . Pulmonary hypertension     3mmHg, echo, 01/2010  . Hyperlipidemia   . Hx of  colonoscopy     approx. 10 years  . Warfarin anticoagulation     Atrial fib  . Ejection fraction     EF 60%, echo, 01/2010  . Normal nuclear stress test     06/2005 , also ABI normal 2007  . Hypercholesterolemia   . Fracture of metacarpal bone 11/30/11  . Aortic dissection     Surgical repair February, 2014  . Mitral regurgitation   . Diabetes mellitus with neuropathy 12/04/2006    Qualifier: Diagnosis of  By: Linda Hedges MD, Heinz Knuckles  medications - DPP4   . Bladder cancer     recurrence with TUR-B March '09  . Prostate cancer     radiation tx.   Past Surgical History  Procedure Laterality Date  . Lumbar laminectomy      '02  . Tur-bt '06, '09    . Bladder cancer biopsy  10/16/2010    negative for malignancy  . Bladder microscopic  04/13/2007    high grade papillary urothelial lesions  . 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 Prostate biopsy  09/25/2011    GLEASON 3+3=6 AND 4+3=7  . Ascending aortic root replacement N/A 04/15/2012    Procedure: ASCENDING AORTIC ROOT REPLACEMENT;  Surgeon: Gaye Pollack, MD;  Location: [facility];  Service: Open Heart Surgery;  Laterality: N/A;  . Tee without cardioversion N/A 04/15/2012    Procedure: TRANSESOPHAGEAL ECHOCARDIOGRAM (TEE);  Surgeon: Gaye Pollack, MD;  Location: [facility];  Service: Open Heart Surgery;  Laterality: N/A;  . Exploration post operative open heart  04/2012  . Cystoscopy/retrograde/ureteroscopy Bilateral 04/04/2013    Procedure: CYSTOSCOPY WITH RETROGRADE PYELOGRAM AND BLADDER BIOPSY;  Surgeon: Molli Hazard, MD;  Location: [facility];  Service: Urology;  Laterality: Bilateral;  . Herniatic disc surgery     Social History:  reports that he quit smoking about 38 years ago. His smoking use included Cigarettes. He smoked 1.00 pack per day. He has never used smokeless tobacco. He reports that he drinks about 7 ounces of alcohol per week. He reports that he does not use illicit drugs. Where does  patient live home. Can patient participate in ADLs? Yes.  No Known Allergies  Family History:  Family History  Problem Relation Age of Onset  . Prostate cancer Father 67    passed with prostate ca  . Cancer Father   . Stroke Father   . Heart disease Mother   . Hypertension Mother      Prior to Admission medications   Medication Sig Start Date End Date Taking? Authorizing Provider  acetaminophen (TYLENOL) 500 MG tablet Take 1,000 mg by mouth 3 (three) times daily.   Yes Historical Provider, MD  allopurinol (ZYLOPRIM) 300 MG tablet Take 1 tablet (300 mg total) by mouth daily. 12/19/13  Yes Biagio Borg, MD  amLODipine (NORVASC) 2.5 MG tablet Take 1 tablet (2.5 mg total) by mouth daily. 07/20/13  Yes Carlena Bjornstad, MD  aspirin 325 MG tablet Take 325 mg by mouth daily.   Yes Historical Provider, MD  atorvastatin (LIPITOR) 20 MG tablet Take 1 tablet (20 mg total) by mouth every morning. 12/19/13  Yes Biagio Borg, MD  Calcium Carb-Cholecalciferol (CALCIUM 600 + D PO) Take 1 tablet by mouth every morning.   Yes Historical Provider, MD  desonide (DESOWEN) 0.05 % lotion Apply 1 application topically daily as needed (for face).   Yes Historical Provider, MD  finasteride (PROSCAR) 5 MG tablet Take 1 tablet (5 mg total) by mouth every morning. 12/23/13  Yes Biagio Borg, MD  furosemide (LASIX) 40 MG tablet Take 40 mg by mouth daily.   Yes Historical Provider, MD  JANUVIA 100 MG tablet take 1 tablet by mouth once daily 10/03/13  Yes Biagio Borg, MD  ketoconazole (NIZORAL) 2 % shampoo Apply 1 application topically 4 (four) times a week.   Yes Historical Provider, MD  metoprolol tartrate (LOPRESSOR) 25 MG tablet Take 1 tablet (25 mg total) by mouth 2 (two) times daily. 09/27/13  Yes Biagio Borg, MD  Multiple Vitamins-Minerals (ONCOVITE PO) Take 1 tablet by mouth daily.    Yes Historical Provider, MD  pantoprazole (PROTONIX) 40 MG tablet Take 1 tablet (40 mg total) by mouth daily. 12/19/13  Yes Biagio Borg, MD  potassium chloride (K-DUR,KLOR-CON) 10 MEQ tablet Take 1 tablet (10 mEq total) by mouth daily. 10/03/13  Yes Carlena Bjornstad, MD  Tamsulosin HCl (FLOMAX) 0.4 MG CAPS Take 0.4 mg by mouth at bedtime.    Yes Historical Provider, MD  traZODone (DESYREL) 25 mg TABS tablet Take 25 mg by mouth at bedtime.   Yes Historical Provider, MD  warfarin (COUMADIN) 4 MG tablet Take as directed by anticoagulation clinic 09/16/13  Yes Biagio Borg, MD    Physical Exam: Filed Vitals:   12/28/13 2230 12/28/13 2300 12/28/13 2330 12/29/13 0049  BP: 167/78 174/77 167/83 172/83  Pulse: 70 71 37 74  Temp:  97.3 F (36.3 C)  TempSrc:    Oral  Resp:    20  Height:    5\' 2"  (1.575 m)  Weight:    100.835 kg (222 lb 4.8 oz)  SpO2: 95% 92% 94% 95%     General: Well-developed well-nourished.  Eyes -Anicteric no pallor.  ENT: No discharge from ears eyes nose and mouth.  Neck No mass felt.  Card S1-S2 heard.  Resp No rhonchi or crepitations.  Abdo Soft nontender bowel sounds present.  Skin No rash.  Musc No edema.  Psyc Appears normal.  Neur Alert awake oriented to time place and person. Extremities.  Labs on Admission:  Basic Metabolic Panel:  Recent Labs Lab 12/28/13 2212  NA 138  K 4.2  CL 96  CO2 27  GLUCOSE 124*  BUN 21  CREATININE 1.03  CALCIUM 9.4   Liver Function Tests:  Recent Labs Lab 12/28/13 2212  AST 26  ALT 15  ALKPHOS 88  BILITOT 1.1  PROT 7.5  ALBUMIN 4.0   No results found for this basename: LIPASE, AMYLASE,  in the last 168 hours No results found for this basename: AMMONIA,  in the last 168 hours CBC:  Recent Labs Lab 12/28/13 2212  WBC 8.1  HGB 10.5*  HCT 32.1*  MCV 91.5  PLT 207   Cardiac Enzymes: No results found for this basename: CKTOTAL, CKMB, CKMBINDEX, TROPONINI,  in the last 168 hours  BNP (last 3 results)  Recent Labs  07/19/13 1833  PROBNP 1583.0*   CBG: No results found for this basename: GLUCAP,  in the last 168  hours  Radiological Exams on Admission: No results found.   Assessment/Plan Active Problems:   Chronic atrial fibrillation   Pulmonary hypertension   Rectal bleeding   Chronic anemia   Rectal bleed    #1. Rectal bleeding - most likely lower GI bleed. However given patient's history of alcoholism at this time we will place patient on Protonix. Closely follow CBC. Type and screen. At this time and not reversing patient's INR but if patient becomes hemodynamically stable or continues to bleed we will give patient FFP and vitamin K and reverse Coumadin. Consult GI in a.m. Patient has had lost colonoscopy in 2004 which showed internal hemorrhoids. Will keep patient nothing by mouth except medications. Hold aspirin. #2. Coagulopathy secondary to Coumadin - see #1. #3. History of alcoholism - patient has been placed on Ativan IV protocol with thiamine. #4. Chronic atrial fibrillation - Coumadin on hold secondary to GI bleed. See #1. Continue rate limiting medications. #5. History of CHF and pulmonary hypertension - holding Lasix for now. #6. Chronic anemia with presently having rectal bleeding - follow CBC. #7. Diabetes mellitus type 2 - closely follow CBGs. On Januvia. #8. Hyperlipidemia - on statins. #9. History of ascending aortic dissection status post repair last year - denies any chest pain.    Code Statfull code.  Family Communication: None.  Disposition Plan admit to inpatient.    Mayu Ronk N. [facility] Pager [phone].  If 7PM-7AM, please contact night-coverage [email or web address] Password [email or web address] 12/29/2013, 1:13 AM

## 2013-12-29 NOTE — Consult Note (Signed)
Referring Provider: No ref. provider found Primary Care Physician:  Cathlean Cower, MD Primary Gastroenterologist:  Dr.Stark  Reason for Consultation:  Rectal bleeding  HPI: Eric Rivers is a 80 y.o. male with history of chronic atrial fibrillation, maintained on Coumadin. He underwent a repair of a thoracic aneurysm dissection in 2014. He has history of adult-onset diabetes mellitus, congestive heart failure, chronic EtOH use, he also has history of stage I prostate cancer and underwent radiation therapy in the spring of 2014. He last had colonoscopy in 2000 for per Dr. Fuller Plan for routine screening this was a normal exam with finding only of internal hemorrhoids. Patient had onset of painless rectal bleeding yesterday afternoon about 4 PM. He says he did not have any associated cramping or pain and no urgency just had a small bowel movement which was just primarily red blood. He says this was about the size of an egg. About 4 hours later he had another episode and then came onto the emergency room. He had one episode last evening smaller amount and none since. No complaints of rectal discomfort or any recent hemorrhoidal symptoms. No recent changes in his bowel habits abdominal pain etc. Patient is a bit disappointed because he was supposed to leave today to go on a 50 day cruise through the [place]. This would have been his 80th cruise. Evaluation in the ER last night showed hemoglobin of 10.4 down from 11.4 in August 2015. INR was 3. Hemoglobin has drifted to 9.6 this morning INR is 2.97.   Past Medical History  Diagnosis Date  . Whooping cough   . Atrial fibrillation      Chronic,   24 hour holter, September, 2011.... atrial fib rate is controlled.... there is some bradycardia but  no marked pauses  . Diabetes mellitus     type 2  . Hypertension   . DDD (degenerative disc disease)     lumbar spine  . GERD (gastroesophageal reflux disease)   . Gout   . Fluid overload   .  ACE-inhibitor cough   . Shortness of breath     on exertion  . Aortic stenosis     mild....echo... september... 2010/ mild... echo.[name] KitchenMarland KitchenDec, 2011  . Pulmonary hypertension     73mmHg, echo, 01/2010  . Hyperlipidemia   . Hx of colonoscopy     approx. 10 years  . Warfarin anticoagulation     Atrial fib  . Ejection fraction     EF 60%, echo, 01/2010  . Normal nuclear stress test     06/2005 , also ABI normal 2007  . Hypercholesterolemia   . Fracture of metacarpal bone 11/30/11  . Aortic dissection     Surgical repair February, 2014  . Mitral regurgitation   . Diabetes mellitus with neuropathy 12/04/2006    Qualifier: Diagnosis of  By: Linda Hedges MD, Heinz Knuckles  medications - DPP4   . Bladder cancer     recurrence with TUR-B March '09  . Prostate cancer     radiation tx.    Past Surgical History  Procedure Laterality Date  . Lumbar laminectomy      '02  . Tur-bt '06, '09    . Bladder cancer biopsy  10/16/2010    negative for malignancy  . Bladder microscopic  04/13/2007    high grade papillary urothelial lesions  . 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 Prostate biopsy  09/25/2011    GLEASON 3+3=6  AND 4+3=7  . Ascending aortic root replacement N/A 04/15/2012    Procedure: ASCENDING AORTIC ROOT REPLACEMENT;  Surgeon: Gaye Pollack, MD;  Location: [facility];  Service: Open Heart Surgery;  Laterality: N/A;  . Tee without cardioversion N/A 04/15/2012    Procedure: TRANSESOPHAGEAL ECHOCARDIOGRAM (TEE);  Surgeon: Gaye Pollack, MD;  Location: [facility];  Service: Open Heart Surgery;  Laterality: N/A;  . Exploration post operative open heart  04/2012  . Cystoscopy/retrograde/ureteroscopy Bilateral 04/04/2013    Procedure: CYSTOSCOPY WITH RETROGRADE PYELOGRAM AND BLADDER BIOPSY;  Surgeon: Molli Hazard, MD;  Location: [facility];  Service: Urology;  Laterality: Bilateral;  . Herniatic disc surgery      Prior to Admission medications   Medication Sig Start  Date End Date Taking? Authorizing Provider  acetaminophen (TYLENOL) 500 MG tablet Take 1,000 mg by mouth 3 (three) times daily.   Yes Historical Provider, MD  allopurinol (ZYLOPRIM) 300 MG tablet Take 1 tablet (300 mg total) by mouth daily. 12/19/13  Yes Biagio Borg, MD  amLODipine (NORVASC) 2.5 MG tablet Take 1 tablet (2.5 mg total) by mouth daily. 07/20/13  Yes Carlena Bjornstad, MD  aspirin 325 MG tablet Take 325 mg by mouth daily.   Yes Historical Provider, MD  atorvastatin (LIPITOR) 20 MG tablet Take 1 tablet (20 mg total) by mouth every morning. 12/19/13  Yes Biagio Borg, MD  Calcium Carb-Cholecalciferol (CALCIUM 600 + D PO) Take 1 tablet by mouth every morning.   Yes Historical Provider, MD  desonide (DESOWEN) 0.05 % lotion Apply 1 application topically daily as needed (for face).   Yes Historical Provider, MD  finasteride (PROSCAR) 5 MG tablet Take 1 tablet (5 mg total) by mouth every morning. 12/23/13  Yes Biagio Borg, MD  furosemide (LASIX) 40 MG tablet Take 40 mg by mouth daily.   Yes Historical Provider, MD  JANUVIA 100 MG tablet take 1 tablet by mouth once daily 10/03/13  Yes Biagio Borg, MD  ketoconazole (NIZORAL) 2 % shampoo Apply 1 application topically 4 (four) times a week.   Yes Historical Provider, MD  metoprolol tartrate (LOPRESSOR) 25 MG tablet Take 1 tablet (25 mg total) by mouth 2 (two) times daily. 09/27/13  Yes Biagio Borg, MD  Multiple Vitamins-Minerals (ONCOVITE PO) Take 1 tablet by mouth daily.    Yes Historical Provider, MD  pantoprazole (PROTONIX) 40 MG tablet Take 1 tablet (40 mg total) by mouth daily. 12/19/13  Yes Biagio Borg, MD  potassium chloride (K-DUR,KLOR-CON) 10 MEQ tablet Take 1 tablet (10 mEq total) by mouth daily. 10/03/13  Yes Carlena Bjornstad, MD  Tamsulosin HCl (FLOMAX) 0.4 MG CAPS Take 0.4 mg by mouth at bedtime.    Yes Historical Provider, MD  traZODone (DESYREL) 25 mg TABS tablet Take 25 mg by mouth at bedtime.   Yes Historical Provider, MD  warfarin  (COUMADIN) 4 MG tablet Take as directed by anticoagulation clinic 09/16/13  Yes Biagio Borg, MD    Current Facility-Administered Medications  Medication Dose Route Frequency Provider Last Rate Last Dose  . acetaminophen (TYLENOL) tablet 650 mg  650 mg Oral Q6H PRN Rise Patience, MD       Or  . acetaminophen (TYLENOL) suppository 650 mg  650 mg Rectal Q6H PRN Rise Patience, MD      . allopurinol (ZYLOPRIM) tablet 300 mg  300 mg Oral Daily Rise Patience, MD   300 mg at 12/29/13 1042  . amLODipine (  NORVASC) tablet 2.5 mg  2.5 mg Oral Daily Rise Patience, MD   2.5 mg at 12/29/13 1043  . atorvastatin (LIPITOR) tablet 20 mg  20 mg Oral q1800 Rise Patience, MD      . finasteride (PROSCAR) tablet 5 mg  5 mg Oral q morning - 10a Rise Patience, MD   5 mg at 12/29/13 1043  . folic acid (FOLVITE) tablet 1 mg  1 mg Oral Daily Rise Patience, MD   1 mg at 12/29/13 1044  . insulin aspart (novoLOG) injection 0-9 Units  0-9 Units Subcutaneous TID WC Orson Eva, MD      . LORazepam (ATIVAN) injection 0-4 mg  0-4 mg Intravenous 4 times per day Rise Patience, MD       Followed by  . [START ON 12/31/2013] LORazepam (ATIVAN) injection 0-4 mg  0-4 mg Intravenous Q12H Rise Patience, MD      . LORazepam (ATIVAN) tablet 1 mg  1 mg Oral Q6H PRN Rise Patience, MD       Or  . LORazepam (ATIVAN) injection 1 mg  1 mg Intravenous Q6H PRN Rise Patience, MD      . metoprolol tartrate (LOPRESSOR) tablet 25 mg  25 mg Oral BID Rise Patience, MD   25 mg at 12/29/13 1044  . multivitamin with minerals tablet 1 tablet  1 tablet Oral Daily Rise Patience, MD   1 tablet at 12/29/13 1044  . ondansetron (ZOFRAN) tablet 4 mg  4 mg Oral Q6H PRN Rise Patience, MD       Or  . ondansetron [facility]) injection 4 mg  4 mg Intravenous Q6H PRN Rise Patience, MD      . pantoprazole (PROTONIX) injection 40 mg  40 mg Intravenous Q12H David Tat, MD      . sodium  chloride 0.9 % injection 3 mL  3 mL Intravenous Q12H Rise Patience, MD      . tamsulosin (FLOMAX) capsule 0.4 mg  0.4 mg Oral QHS Rise Patience, MD   0.4 mg at 12/29/13 0150  . thiamine (VITAMIN B-1) tablet 100 mg  100 mg Oral Daily Rise Patience, MD   100 mg at 12/29/13 1044   Or  . thiamine (B-1) injection 100 mg  100 mg Intravenous Daily Rise Patience, MD      . traZODone (DESYREL) tablet 25 mg  25 mg Oral QHS Rise Patience, MD   25 mg at 12/29/13 0150    Allergies as of 12/28/2013  . (No Known Allergies)    Family History  Problem Relation Age of Onset  . Prostate cancer Father 25    passed with prostate ca  . Cancer Father   . Stroke Father   . Heart disease Mother   . Hypertension Mother     History   Social History  . Marital Status: Single    Spouse Name: N/A    Number of Children: 0  . Years of Education: 53   Occupational History  . history and Advice worker     retired   Social History Main Topics  . Smoking status: Former Smoker -- 1.00 packs/day    Types: Cigarettes    Quit date: 03/04/1975  . Smokeless tobacco: Never Used  . Alcohol Use: 7.0 oz/week    14 drink(s) per week     Comment: liquor daily  . Drug Use: No  . Sexual Activity: Not  Currently   Other Topics Concern  . Not on file   Social History Narrative   Confirmed batchelor. Retired Education officer, museum. World traveler- Ambulance person. I- ADLS. End of life Care   No CPR, no prolonged intubation, no prolonged artificial hydration or feeding, no heroic or futile measures.       Next trip Sept [place]13 [place]      Last cruise - two cruises back to back to the [place] in January '15. May'15: [place] with stops in [place], [place], [place], [place], [place]. 15 days      Fall '15: 50 Days from [place]. [place], [place], [place], [place], [place], [place], [place], [place], [place].         May '15 - 15 days up the [employer] as far as [place]             Review of Systems: Pertinent positive and negative review of systems were noted in the above HPI section.  All other review of systems was otherwise negative.[name] Kitchen  Physical Exam: Vital signs in last 24 hours: Temp:  [97.3 F (36.3 C)-98.3 F (36.8 C)] 97.5 F (36.4 C) (10/29 1027) Pulse Rate:  [37-102] 54 (10/29 1027) Resp:  [18-20] 18 (10/29 1027) BP: (133-174)/(52-84) 141/62 mmHg (10/29 1027) SpO2:  [92 %-96 %] 95 % (10/29 1027) Weight:  [222 lb 4.8 oz (100.835 kg)-222 lb 8 oz (100.925 kg)] 222 lb 8 oz (100.925 kg) (10/29 0453)   General:   Alert,  Well-developed, well-nourished,elderly WM, pleasant and cooperative in NAD Head:  Normocephalic and atraumatic. Eyes:  Sclera clear, no icterus.   Conjunctiva pink. Ears:  Normal auditory acuity. Nose:  No deformity, discharge,  or lesions. Mouth:  No deformity or lesions.   Neck:  Supple; no masses or thyromegaly. Lungs:  Clear throughout to auscultation.   No wheezes, crackles, or rhonchi. Heart: ir Regular rate and rhythm; no murmurs, clicks, rubs,  or gallops. Abdomen:  Soft,nontender, BS active,nonpalp mass or hsm.   Rectal:  Deferred  Msk:  Symmetrical without gross deformities. . Pulses:  Normal pulses noted. Extremities:  Without clubbing or edema. Neurologic:  Alert and  oriented x4;  grossly normal neurologically. Skin:  Intact without significant lesions or rashes.. Psych:  Alert and cooperative. Normal mood and affect.  Lab Results:  Recent Labs  12/29/13 0134 12/29/13 0454 12/29/13 0844  WBC 8.6 7.9 8.4  HGB 10.4* 9.6* 10.7*  HCT 32.8* 30.3* 33.6*  PLT 201 181 200   BMET  Recent Labs  12/28/13 2212 12/29/13 0454  NA 138 142  K 4.2 3.8  CL 96 102  CO2 27 29  GLUCOSE 124* 104*  BUN 21 18  CREATININE 1.03 1.03  CALCIUM 9.4 9.1   LFT  Recent Labs  12/29/13 0454  PROT 6.7  ALBUMIN 3.6  AST 23  ALT 13  ALKPHOS 77  BILITOT  1.5*   PT/INR  Recent Labs  12/28/13 2212 12/29/13 0454  LABPROT 31.7* 31.2*  INR 3.04* 2.97*    IMPRESSION:  #104  80 year old male with painless hematochezia and setting of chronic anticoagulation and INR of 3. Thus far bleeding has been mild though hemoglobin has drifted a gram. He has been hemodynamically stable. Will need to rule out occult colon lesion, also consider bleeding secondary to internal hemorrhoids diverticulosis or possible radiation proctitis #2 anemia secondary to above #3 history of chronic atrial fibrillation #4 CHF #  5 history of stage I prostate cancer status post radiation #6 hypertension #7 history of chronic EtOH use   Plan; hold Coumadin, and allow INR to drift Serial hemoglobins and transfuse for hemoglobin 8 or less He will need colonoscopy this admission but would like INR to be less than 2 prior to proceeding We'll give him solid food today as it may be a day or 2 before he can be prepped. We will follow with you   Amy Esterwood  12/29/2013, 11:08 AM   [facility] Attending  I have also seen and assessed the patient and agree with the above note.   Gatha Mayer, MD, [facility] [phone] (pager) 12/29/2013 6:35 PM

## 2013-12-29 NOTE — Progress Notes (Signed)
Three episodes of rectal bleeding(minimal frank)  Vital signs stable continue to monitior

## 2013-12-29 NOTE — Progress Notes (Signed)
PROGRESS NOTE  Eric Rivers [id number]:[id number] DOB: Apr 11, 1930 DOA: 12/28/2013 PCP: Cathlean Cower, MD  Brief history 80 year old male with a history of alcohol dependence, chronic atrial fibrillation (on Coumadin), diabetes mellitus type 2, hypertension, hyperlipidemia presented with 2 episodes of hematochezia at home on the day of admission. Since admission, the patient has had 2 additional bowel movements with bright red blood although smaller in volume. The patient denied any fevers, chills, dizziness, syncope, shortness of breath, abdominal pain, vomiting, hematemesis. At the time of admission, hemoglobin was 10.5, INR was 3.04, lactic acid 1.62. Gastroenterology was consulted.  Assessment/Plan: Hematochezia -2 small episodes of rectal bleeding since admission -Mild drop in hemoglobin since admission -Consult GI -Plan to discontinue protonix drip -hold coumadin -baseline Hgb 10-11 Coagulopathy  -Since the patient is hemodynamically stable and hemoglobin fairly stable, will allow INR to trend down until GI prefers otherwise -hold coumadin Chronic atrial fibrillation -Rate controlled -Continue metoprolol tartrate Diabetes mellitus type 2  -10/27/2013 hemoglobin A1c 6.6  -NovoLog sliding scale  Alcohol dependence  -Patient drinks 2 "cups" of vodka daily -CIWA -last drink 12/27/13 PM Hypertension  -Continue amlodipine and metoprolol tartrate  Hyperlipidemia  -Continue statin   Family Communication:   Pt at beside Disposition Plan:   Home when medically stable       Subjective: Patient denies fevers, chills, headache, chest pain, dyspnea, nausea, vomiting, diarrhea, abdominal pain, dysuria, hematuria Patient has had 2 small episodes of rectal bleeding since admission.    Objective: Filed Vitals:   12/28/13 2330 12/29/13 0049 12/29/13 0453 12/29/13 0801  BP: 167/83 172/83 143/59 133/52  Pulse: 37 74 53 102  Temp:  97.3 F (36.3 C) 98.1 F (36.7 C) 97.7 F  (36.5 C)  TempSrc:  Oral Oral Oral  Resp:  20 20 18   Height:  5\' 2"  (1.575 m)    Weight:  100.835 kg (222 lb 4.8 oz) 100.925 kg (222 lb 8 oz)   SpO2: 94% 95% 93% 93%    Intake/Output Summary (Last 24 hours) at 12/29/13 0838 Last data filed at 12/29/13 0500  Gross per 24 hour  Intake  162.5 ml  Output      0 ml  Net  162.5 ml   Weight change:  Exam:   General:  Pt is alert, follows commands appropriately, not in acute distress  HEENT: No icterus, No thrush,  [place]/AT  Cardiovascular: RRR, S1/S2, no rubs, no gallops  Respiratory: CTA bilaterally, no wheezing, no crackles, no rhonchi  Abdomen: Soft/+BS, non tender, non distended, no guarding  Extremities: 1+LE edema, No lymphangitis, No petechiae, No rashes, no synovitis  Data Reviewed: Basic Metabolic Panel:  Recent Labs Lab 12/28/13 2212 12/29/13 0454  NA 138 142  K 4.2 3.8  CL 96 102  CO2 27 29  GLUCOSE 124* 104*  BUN 21 18  CREATININE 1.03 1.03  CALCIUM 9.4 9.1   Liver Function Tests:  Recent Labs Lab 12/28/13 2212 12/29/13 0454  AST 26 23  ALT 15 13  ALKPHOS 88 77  BILITOT 1.1 1.5*  PROT 7.5 6.7  ALBUMIN 4.0 3.6   No results found for this basename: LIPASE, AMYLASE,  in the last 168 hours No results found for this basename: AMMONIA,  in the last 168 hours CBC:  Recent Labs Lab 12/28/13 2212 12/29/13 0134 12/29/13 0454  WBC 8.1 8.6 7.9  HGB 10.5* 10.4* 9.6*  HCT 32.1* 32.8* 30.3*  MCV 91.5 93.4 93.8  PLT 207 201 181   Cardiac Enzymes: No results found for this basename: CKTOTAL, CKMB, CKMBINDEX, TROPONINI,  in the last 168 hours BNP: No components found with this basename: POCBNP,  CBG:  Recent Labs Lab 12/29/13 0816  GLUCAP 121*    No results found for this or any previous visit (from the past 240 hour(s)).   Scheduled Meds: . allopurinol  300 mg Oral Daily  . amLODipine  2.5 mg Oral Daily  . atorvastatin  20 mg Oral q1800  . finasteride  5 mg Oral q morning - 53Z  . folic  acid  1 mg Oral Daily  . LORazepam  0-4 mg Intravenous 4 times per day   Followed by  . [START ON 12/31/2013] LORazepam  0-4 mg Intravenous Q12H  . metoprolol tartrate  25 mg Oral BID  . multivitamin with minerals  1 tablet Oral Daily  . [START ON 01/01/2014] pantoprazole (PROTONIX) IV  40 mg Intravenous Q12H  . sodium chloride  3 mL Intravenous Q12H  . tamsulosin  0.4 mg Oral QHS  . thiamine  100 mg Oral Daily   Or  . thiamine  100 mg Intravenous Daily  . traZODone  25 mg Oral QHS   Continuous Infusions: . pantoprozole (PROTONIX) infusion 8 mg/hr (12/29/13 0230)     Emmanuela Ghazi, DO  [facility] Pager [phone]  If 7PM-7AM, please contact night-coverage [email or web address] Password [email or web address] 12/29/2013, 8:38 AM   LOS: 1 day

## 2013-12-30 LAB — GLUCOSE, CAPILLARY
GLUCOSE-CAPILLARY: 113 mg/dL — AB (ref 70–99)
GLUCOSE-CAPILLARY: 112 mg/dL — AB (ref 70–99)
Glucose-Capillary: 129 mg/dL — ABNORMAL HIGH (ref 70–99)
Glucose-Capillary: 148 mg/dL — ABNORMAL HIGH (ref 70–99)

## 2013-12-30 LAB — PROTIME-INR
INR: 2.71 — AB (ref 0.00–1.49)
Prothrombin Time: 29 seconds — ABNORMAL HIGH (ref 11.6–15.2)

## 2013-12-30 LAB — HEMOGLOBIN AND HEMATOCRIT, BLOOD
HCT: 31.1 % — ABNORMAL LOW (ref 39.0–52.0)
HEMATOCRIT: 30.3 % — AB (ref 39.0–52.0)
HEMOGLOBIN: 9.9 g/dL — AB (ref 13.0–17.0)
Hemoglobin: 9.6 g/dL — ABNORMAL LOW (ref 13.0–17.0)

## 2013-12-30 MED ORDER — VITAMIN K1 10 MG/ML IJ SOLN: 10.0000 mg | Freq: Once | INTRAVENOUS | Status: DC

## 2013-12-30 MED ORDER — VITAMIN K1 10 MG/ML IJ SOLN
5.0000 mg | Freq: Once | INTRAVENOUS | Status: AC
  Administered 2013-12-30: 5 mg via INTRAVENOUS
  Filled 2013-12-30: qty 0.5

## 2013-12-30 NOTE — Progress Notes (Signed)
Patient ID: Eric Rivers, male   DOB: 02-21-1931, 80 y.o.   MRN: [id number]  [facility] Progress Note  Subjective: Doing well , no complaints-no further bleeding HGB 10.2  Objective:  Vital signs in last 24 hours: Temp:  [97.5 F (36.4 C)-98.3 F (36.8 C)] 98.3 F (36.8 C) (10/30 0605) Pulse Rate:  [43-85] 64 (10/30 0947) Resp:  [18-20] 18 (10/30 0605) BP: (118-150)/(49-63) 150/63 mmHg (10/30 0605) SpO2:  [94 %-95 %] 95 % (10/30 0605) Weight:  [222 lb 3.6 oz (100.8 kg)] 222 lb 3.6 oz (100.8 kg) (10/30 2633) Last BM Date: 12/29/13 General:   Alert,  Well-developed, elderly Wm   in NAD Heart:  Regular rate and rhythm; no murmurs Pulm;clear Abdomen:  Soft, nontender and nondistended. Normal bowel sounds, without guarding, and without rebound.   Extremities:  Without edema. Neurologic:  Alert and  oriented x4;  grossly normal neurologically. Psych:  Alert and cooperative. Normal mood and affect.   Lab Results:  Recent Labs  12/29/13 0844 12/29/13 1304 12/29/13 1701  WBC 8.4 6.8 7.0  HGB 10.7* 9.6* 10.2*  HCT 33.6* 29.8* 31.6*  PLT 200 175 180   PT/INR  Recent Labs  12/29/13 0454 12/30/13 0425  LABPROT 31.2* 29.0*  INR 2.97* 2.71*     Assessment / Plan: #1  80 yo male with acute lower GI Bleed- no active ongoing hemorrhage-?diverticular,radiation proctitis ,hemorrhoidal HGB stable Waiting for INR to normalize- will give 10 vit k today Hopefully can prep tomorrow and do procedure Sunday #2 atrial Fib #3 chronic anticoagulation #4hx prostate cancer #5 s/p repair of thoracic dissection     LOS: 2 days   Amy Esterwood  12/30/2013, 10:00 AM  [facility] Attending  I have also seen and assessed the patient and agree with the above note. Waiting for INR to come closer to 2 I anticipate I will scope him 11/1

## 2013-12-30 NOTE — Clinical Documentation Improvement (Signed)
  Current BMI 40.7. Please address in Notes to reflect severity of illness and risk of mortality.  Possible Conditions . Morbid obesity . Obesity . Other  Thank Dennis Bast  Barrie Dunker RN [facility]

## 2013-12-30 NOTE — Progress Notes (Signed)
PROGRESS NOTE  Eric Rivers [id number] DOB: Jun 24, 1930 DOA: 12/28/2013 PCP: Cathlean Cower, MD  Brief history  80 year old male with a history of alcohol dependence, chronic atrial fibrillation (on Coumadin), diabetes mellitus type 2, hypertension, hyperlipidemia presented with 2 episodes of hematochezia at home on the day of admission. Since admission, the patient has had 2 additional bowel movements with bright red blood although smaller in volume. The patient denied any fevers, chills, dizziness, syncope, shortness of breath, abdominal pain, vomiting, hematemesis. At the time of admission, hemoglobin was 10.5, INR was 3.04, lactic acid 1.62. Gastroenterology was consulted.  Assessment/Plan:  Hematochezia  -2 small episodes of rectal bleeding since admission  -Mild drop in hemoglobin since admission, but has remained stable thereafter -Appreciate GI--> Vitamin K 5 g IV 1 given -Plasma before colonoscopy on 01/01/2014 -Plan to discontinue protonix drip  -hold coumadin  -baseline Hgb 10-11  Coagulopathy  -Restart warfarin when cleared by gastroenterology -hold coumadin  Chronic atrial fibrillation  -Rate controlled  -Continue metoprolol tartrate  Diabetes mellitus type 2  -10/27/2013 hemoglobin A1c 6.6  -NovoLog sliding scale  Alcohol dependence  -Patient drinks 2 "cups" of vodka daily  -CIWA  -last drink 12/27/13 PM  Hypertension  -Continue amlodipine and metoprolol tartrate  Hyperlipidemia  -Continue statin  Family Communication: Pt at beside  Disposition Plan: Home when medically stable       Subjective: Patient denies fevers, chills, headache, chest pain, dyspnea, nausea, vomiting, diarrhea, abdominal pain, dysuria, hematuria   Objective: Filed Vitals:   12/30/13 0605 12/30/13 0611 12/30/13 0947 12/30/13 1355  BP: 150/63   136/66  Pulse: 43  64 62  Temp: 98.3 F (36.8 C)   98.4 F (36.9 C)  TempSrc: Oral   Oral  Resp: 18   18  Height:        Weight:  100.8 kg (222 lb 3.6 oz)    SpO2: 95%   97%    Intake/Output Summary (Last 24 hours) at 12/30/13 1910 Last data filed at 12/30/13 1822  Gross per 24 hour  Intake    840 ml  Output      0 ml  Net    840 ml   Weight change: -0.035 kg (-1.2 oz) Exam:   General:  Pt is alert, follows commands appropriately, not in acute distress  HEENT: No icterus, No thrush,  [place]/AT  Cardiovascular: RRR, S1/S2, no rubs, no gallops  Respiratory: CTA bilaterally, no wheezing, no crackles, no rhonchi  Abdomen: Soft/+BS, non tender, non distended, no guarding  Extremities: 1+LE edema, No lymphangitis, No petechiae, No rashes, no synovitis  Data Reviewed: Basic Metabolic Panel:  Recent Labs Lab 12/28/13 2212 12/29/13 0454  NA 138 142  K 4.2 3.8  CL 96 102  CO2 27 29  GLUCOSE 124* 104*  BUN 21 18  CREATININE 1.03 1.03  CALCIUM 9.4 9.1   Liver Function Tests:  Recent Labs Lab 12/28/13 2212 12/29/13 0454  AST 26 23  ALT 15 13  ALKPHOS 88 77  BILITOT 1.1 1.5*  PROT 7.5 6.7  ALBUMIN 4.0 3.6   No results found for this basename: LIPASE, AMYLASE,  in the last 168 hours No results found for this basename: AMMONIA,  in the last 168 hours CBC:  Recent Labs Lab 12/29/13 0134 12/29/13 0454 12/29/13 0844 12/29/13 1304 12/29/13 1701 12/30/13 0425  WBC 8.6 7.9 8.4 6.8 7.0  --   HGB 10.4* 9.6* 10.7* 9.6* 10.2*  9.9*  HCT 32.8* 30.3* 33.6* 29.8* 31.6* 31.1*  MCV 93.4 93.8 92.3 92.5 92.7  --   PLT 201 181 200 175 180  --    Cardiac Enzymes: No results found for this basename: CKTOTAL, CKMB, CKMBINDEX, TROPONINI,  in the last 168 hours BNP: No components found with this basename: POCBNP,  CBG:  Recent Labs Lab 12/29/13 1658 12/29/13 2153 12/30/13 0749 12/30/13 1143 12/30/13 1805  GLUCAP 132* 172* 113* 112* 129*    No results found for this or any previous visit (from the past 240 hour(s)).   Scheduled Meds: . allopurinol  300 mg Oral Daily  . amLODipine  2.5  mg Oral Daily  . atorvastatin  20 mg Oral q1800  . finasteride  5 mg Oral q morning - 95G  . folic acid  1 mg Oral Daily  . insulin aspart  0-9 Units Subcutaneous TID WC  . LORazepam  0-4 mg Intravenous 4 times per day   Followed by  . [START ON 12/31/2013] LORazepam  0-4 mg Intravenous Q12H  . metoprolol tartrate  25 mg Oral BID  . multivitamin with minerals  1 tablet Oral Daily  . pantoprazole (PROTONIX) IV  40 mg Intravenous Q12H  . sodium chloride  3 mL Intravenous Q12H  . tamsulosin  0.4 mg Oral QHS  . thiamine  100 mg Oral Daily   Or  . thiamine  100 mg Intravenous Daily  . traZODone  25 mg Oral QHS   Continuous Infusions:    Eric Gottsch, DO  [facility] Pager [phone]  If 7PM-7AM, please contact night-coverage [email or web address] Password [email or web address] 12/30/2013, 7:10 PM   LOS: 2 days

## 2013-12-31 LAB — GLUCOSE, CAPILLARY
GLUCOSE-CAPILLARY: 113 mg/dL — AB (ref 70–99)
Glucose-Capillary: 148 mg/dL — ABNORMAL HIGH (ref 70–99)
Glucose-Capillary: 120 mg/dL — ABNORMAL HIGH (ref 70–99)
Glucose-Capillary: 136 mg/dL — ABNORMAL HIGH (ref 70–99)

## 2013-12-31 LAB — BASIC METABOLIC PANEL
Anion gap: 12 (ref 5–15)
BUN: 13 mg/dL (ref 6–23)
CHLORIDE: 104 meq/L (ref 96–112)
CO2: 25 mEq/L (ref 19–32)
Calcium: 9.2 mg/dL (ref 8.4–10.5)
Creatinine, Ser: 0.94 mg/dL (ref 0.50–1.35)
GFR calc Af Amer: 88 mL/min — ABNORMAL LOW (ref >90)
GFR, EST NON AFRICAN AMERICAN: 76 mL/min — AB (ref >90)
GLUCOSE: 116 mg/dL — AB (ref 70–99)
POTASSIUM: 3.9 meq/L (ref 3.7–5.3)
Sodium: 141 mEq/L (ref 137–147)

## 2013-12-31 LAB — HEMOGLOBIN AND HEMATOCRIT, BLOOD
HCT: 33.8 % — ABNORMAL LOW (ref 39.0–52.0)
HEMATOCRIT: 32 % — AB (ref 39.0–52.0)
HEMOGLOBIN: 10.6 g/dL — AB (ref 13.0–17.0)
Hemoglobin: 10 g/dL — ABNORMAL LOW (ref 13.0–17.0)

## 2013-12-31 LAB — PROTIME-INR
INR: 1.67 — ABNORMAL HIGH (ref 0.00–1.49)
Prothrombin Time: 19.9 seconds — ABNORMAL HIGH (ref 11.6–15.2)

## 2013-12-31 LAB — CBC
HCT: 30.6 % — ABNORMAL LOW (ref 39.0–52.0)
Hemoglobin: 9.6 g/dL — ABNORMAL LOW (ref 13.0–17.0)
MCH: 30 pg (ref 26.0–34.0)
MCHC: 31.4 g/dL (ref 30.0–36.0)
MCV: 95.6 fL (ref 78.0–100.0)
Platelets: 178 10*3/uL (ref 150–400)
RBC: 3.2 MIL/uL — AB (ref 4.22–5.81)
RDW: 16.8 % — ABNORMAL HIGH (ref 11.5–15.5)
WBC: 9.7 10*3/uL (ref 4.0–10.5)

## 2013-12-31 MED ORDER — PEG-KCL-NACL-NASULF-NA ASC-C 100 G PO SOLR
0.5000 | Freq: Once | ORAL | Status: AC
  Administered 2013-12-31: 100 g via ORAL
  Filled 2013-12-31: qty 1

## 2013-12-31 MED ORDER — PEG-KCL-NACL-NASULF-NA ASC-C 100 G PO SOLR
0.5000 | Freq: Once | ORAL | Status: AC
  Administered 2013-12-31: 100 g via ORAL

## 2013-12-31 MED ORDER — PEG-KCL-NACL-NASULF-NA ASC-C 100 G PO SOLR: 1.0000 | Freq: Once | ORAL | Status: DC

## 2013-12-31 NOTE — Progress Notes (Signed)
Patient ID: Eric Rivers, male   DOB: 1930-06-05, 80 y.o.   MRN: [id number]  [facility] Progress Note  Subjective: Doing well- no active bleeding hgb 9.6  Objective:  Vital signs in last 24 hours: Temp:  [98.2 F (36.8 C)-98.4 F (36.9 C)] 98.2 F (36.8 C) (10/31 0524) Pulse Rate:  [55-62] 60 (10/31 0524) Resp:  [18] 18 (10/31 0524) BP: (136-165)/(66-82) 158/69 mmHg (10/31 0524) SpO2:  [95 %-97 %] 95 % (10/31 0524) Last BM Date: 12/30/13 General:   Alert,  Well-developed, elderly WM   in NAD Heart:  Regular rate and rhythm; no murmurs Pulm;clear Abdomen:  Soft, nontender and nondistended. Normal bowel sounds, without guarding, and without rebound.   Extremities:  Without edema. Neurologic:  Alert and  oriented x4;  grossly normal neurologically. Psych:  Alert and cooperative. Normal mood and affect.  Lab Results:  Recent Labs  12/29/13 1304 12/29/13 1701 12/30/13 0425 12/30/13 2210 12/31/13 0520  WBC 6.8 7.0  --   --  9.7  HGB 9.6* 10.2* 9.9* 9.6* 9.6*  HCT 29.8* 31.6* 31.1* 30.3* 30.6*  PLT 175 180  --   --  178   PT/INR  Recent Labs  12/30/13 0425 12/31/13 0520  LABPROT 29.0* 19.9*  INR 2.71* 1.67*  l  Assessment / Plan: #1 79 yo WM with acute self limited lower GI bleed-  Will prep today for colon tomorrow am with Dr. Kreg Shropshire #2 Anemia- stable #3 chronic anticoagulation- Coumadin on hold and INR normalizing after vit k yesterday   LOS: 3 days   Amy Esterwood  12/31/2013, 9:47 AM  [facility] Attending  I have also seen and assessed the patient and agree with the above note. The risks and benefits as well as alternatives of endoscopic procedure(s) have been discussed and reviewed. All questions answered. The patient agrees to proceed.  Gatha Mayer, MD, [facility] [phone] (pager) 12/31/2013 2:33 PM

## 2013-12-31 NOTE — Progress Notes (Signed)
PROGRESS NOTE  Eric Rivers WPV:[id number] DOB: May 31, 1930 DOA: 12/28/2013 PCP: Cathlean Cower, MD  Brief history  80 year old male with a history of alcohol dependence, chronic atrial fibrillation (on Coumadin), diabetes mellitus type 2, hypertension, hyperlipidemia presented with 2 episodes of hematochezia at home on the day of admission. Since admission, the patient has had 2 additional bowel movements with bright red blood although smaller in volume. The patient denied any fevers, chills, dizziness, syncope, shortness of breath, abdominal pain, vomiting, hematemesis. At the time of admission, hemoglobin was 10.5, INR was 3.04, lactic acid 1.62. Gastroenterology was consulted. The patient was given vitamin K 5 mg IV 1. His INR improved. Colonoscopy is planned 01/01/2014.   Assessment/Plan:  Hematochezia  -2 small episodes of rectal bleeding since admission  -Mild drop in hemoglobin since admission, but has remained stable thereafter  -Appreciate GI--> Vitamin K 5 g IV 1 given-->INR 1.67 -Plasma before colonoscopy on 01/01/2014  -hold coumadin  -Hemoglobin has remained stable since admission, no further episodes of hematochezia in the last 48 hours -baseline Hgb 10-11  Coagulopathy  -Restart warfarin when cleared by gastroenterology  -hold coumadin  Chronic atrial fibrillation  -Rate controlled  -Continue metoprolol tartrate  Diabetes mellitus type 2  -10/27/2013 hemoglobin A1c 6.6  -NovoLog sliding scale  -CBGs controlled Alcohol dependence  -Patient drinks 2 "cups" of vodka daily  -CIWA  -last drink 12/27/13 PM  Hypertension  -Continue amlodipine and metoprolol tartrate  Hyperlipidemia  -Continue statin  Family Communication: Pt at beside  Disposition Plan: Home when medically stable   Procedures/Studies:  No results found.      Subjective: Patient denies fevers, chills, headache, chest pain, dyspnea, nausea, vomiting, diarrhea, abdominal pain,  dysuria, hematuria   Objective: Filed Vitals:   12/30/13 1355 12/30/13 2207 12/31/13 0524 12/31/13 1400  BP: 136/66 165/82 158/69 136/55  Pulse: 62 55 60 50  Temp: 98.4 F (36.9 C) 98.3 F (36.8 C) 98.2 F (36.8 C) 98.3 F (36.8 C)  TempSrc: Oral Oral Oral Oral  Resp: _0 Height:      Weight:      SpO2: 97% 96% 95% 97%    Intake/Output Summary (Last 24 hours) at 12/31/13 1453 Last data filed at 12/31/13 0720  Gross per 24 hour  Intake    480 ml  Output      0 ml  Net    480 ml   Weight change:  Exam:   General:  Pt is alert, follows commands appropriately, not in acute distress  HEENT: No icterus, No thrush,  [place]/AT  Cardiovascular: RRR, S1/S2, no rubs, no gallops  Respiratory: CTA bilaterally, no wheezing, no crackles, no rhonchi  Abdomen: Soft/+BS, non tender, non distended, no guarding  Extremities: 1+LE edema, No lymphangitis, No petechiae, No rashes, no synovitis  Data Reviewed: Basic Metabolic Panel:  Recent Labs Lab 12/28/13 2212 12/29/13 0454 12/31/13 0520  NA 138 142 141  K 4.2 3.8 3.9  CL 96 102 104  CO2 _1 GLUCOSE 124* 104* 116*  BUN _2 CREATININE 1.03 1.03 0.94  CALCIUM 9.4 9.1 9.2   Liver Function Tests:  Recent Labs Lab 12/28/13 2212 12/29/13 0454  AST 26 23  ALT 15 13  ALKPHOS 88 77  BILITOT 1.1 1.5*  PROT 7.5 6.7  ALBUMIN 4.0 3.6   No results found for this basename: LIPASE, AMYLASE,  in the last 168 hours  No results found for this basename: AMMONIA,  in the last 168 hours CBC:  Recent Labs Lab 12/29/13 0454 12/29/13 0844 12/29/13 1304 12/29/13 1701 12/30/13 0425 12/30/13 2210 12/31/13 0520 12/31/13 1050  WBC 7.9 8.4 6.8 7.0  --   --  9.7  --   HGB 9.6* 10.7* 9.6* 10.2* 9.9* 9.6* 9.6* 10.0*  HCT 30.3* 33.6* 29.8* 31.6* 31.1* 30.3* 30.6* 32.0*  MCV 93.8 92.3 92.5 92.7  --   --  95.6  --   PLT 181 200 175 180  --   --  178  --    Cardiac Enzymes: No results found for this basename:  CKTOTAL, CKMB, CKMBINDEX, TROPONINI,  in the last 168 hours BNP: No components found with this basename: POCBNP,  CBG:  Recent Labs Lab 12/30/13 1143 12/30/13 1805 12/30/13 2210 12/31/13 0754 12/31/13 1139  GLUCAP 112* 129* 148* 113* 148*    No results found for this or any previous visit (from the past 240 hour(s)).   Scheduled Meds: . allopurinol  300 mg Oral Daily  . amLODipine  2.5 mg Oral Daily  . atorvastatin  20 mg Oral q1800  . finasteride  5 mg Oral q morning - 92H  . folic acid  1 mg Oral Daily  . insulin aspart  0-9 Units Subcutaneous TID WC  . LORazepam  0-4 mg Intravenous Q12H  . metoprolol tartrate  25 mg Oral BID  . multivitamin with minerals  1 tablet Oral Daily  . pantoprazole (PROTONIX) IV  40 mg Intravenous Q12H  . peg 3350 powder  0.5 kit Oral Once   And  . peg 3350 powder  0.5 kit Oral Once  . sodium chloride  3 mL Intravenous Q12H  . tamsulosin  0.4 mg Oral QHS  . thiamine  100 mg Oral Daily   Or  . thiamine  100 mg Intravenous Daily  . traZODone  25 mg Oral QHS   Continuous Infusions:    Clark Clowdus, DO  [facility] Pager [phone]  If 7PM-7AM, please contact night-coverage [email or web address] Password [email or web address] 12/31/2013, 2:53 PM   LOS: 3 days

## 2014-01-01 ENCOUNTER — Encounter (HOSPITAL_COMMUNITY): Payer: Self-pay | Admitting: Gastroenterology

## 2014-01-01 ENCOUNTER — Encounter (HOSPITAL_COMMUNITY)
Admission: EM | Disposition: A | Discharge status: Home / Self Care | Payer: Self-pay | Source: Home / Self Care | Attending: Internal Medicine

## 2014-01-01 DIAGNOSIS — L309 Dermatitis, unspecified: Secondary | ICD-10-CM | POA: Clinically undetermined

## 2014-01-01 DIAGNOSIS — K5731 Diverticulosis of large intestine without perforation or abscess with bleeding: Secondary | ICD-10-CM

## 2014-01-01 HISTORY — PX: COLONOSCOPY: SHX5424

## 2014-01-01 HISTORY — DX: Diverticulosis of large intestine without perforation or abscess with bleeding: K57.31

## 2014-01-01 LAB — GLUCOSE, CAPILLARY
GLUCOSE-CAPILLARY: 125 mg/dL — AB (ref 70–99)
Glucose-Capillary: 150 mg/dL — ABNORMAL HIGH (ref 70–99)

## 2014-01-01 LAB — HEMOGLOBIN AND HEMATOCRIT, BLOOD
HEMATOCRIT: 29.4 % — AB (ref 39.0–52.0)
HEMOGLOBIN: 9.3 g/dL — AB (ref 13.0–17.0)

## 2014-01-01 LAB — PROTIME-INR
INR: 1.46 (ref 0.00–1.49)
Prothrombin Time: 17.9 seconds — ABNORMAL HIGH (ref 11.6–15.2)

## 2014-01-01 SURGERY — COLONOSCOPY
Anesthesia: Moderate Sedation

## 2014-01-01 MED ORDER — ENOXAPARIN SODIUM 100 MG/ML ~~LOC~~ SOLN: 100.0000 mg | Freq: Two times a day (BID) | SUBCUTANEOUS | Status: DC

## 2014-01-01 MED ORDER — MIDAZOLAM HCL 5 MG/5ML IJ SOLN
INTRAMUSCULAR | Status: DC | PRN
  Administered 2014-01-01 (×2): 2 mg via INTRAVENOUS

## 2014-01-01 MED ORDER — FENTANYL CITRATE 0.05 MG/ML IJ SOLN
INTRAMUSCULAR | Status: DC | PRN
  Administered 2014-01-01 (×2): 25 ug via INTRAVENOUS

## 2014-01-01 MED ORDER — SODIUM CHLORIDE 0.9 % IV SOLN
INTRAVENOUS | Status: DC
  Administered 2014-01-01: 500 mL via INTRAVENOUS

## 2014-01-01 MED ORDER — NYSTATIN-TRIAMCINOLONE 100000-0.1 UNIT/GM-% EX OINT
TOPICAL_OINTMENT | Freq: Two times a day (BID) | CUTANEOUS | Status: DC
  Administered 2014-01-01: 10:00:00 via TOPICAL
  Filled 2014-01-01: qty 15

## 2014-01-01 MED ORDER — FENTANYL CITRATE 0.05 MG/ML IJ SOLN
INTRAMUSCULAR | Status: AC
  Filled 2014-01-01: qty 2

## 2014-01-01 MED ORDER — DIPHENHYDRAMINE HCL 50 MG/ML IJ SOLN
INTRAMUSCULAR | Status: AC
  Filled 2014-01-01: qty 1

## 2014-01-01 MED ORDER — MIDAZOLAM HCL 10 MG/2ML IJ SOLN
INTRAMUSCULAR | Status: AC
  Filled 2014-01-01: qty 2

## 2014-01-01 NOTE — Plan of Care (Signed)
Problem: Phase I Progression Outcomes Goal: OOB as tolerated unless otherwise ordered Outcome: Progressing  Comments:  Patient sitting in chair eating lunch.

## 2014-01-01 NOTE — Discharge Instructions (Addendum)
YOU HAD AN ENDOSCOPIC PROCEDURE TODAY: Refer to the procedure report and other information in the discharge instructions given to you for any specific questions about what was found during the examination. If this information does not answer your questions, please call Dr. Celesta Aver office at [phone] to clarify.   YOU SHOULD EXPECT: Some feelings of bloating in the abdomen. Passage of more gas than usual. Walking can help get rid of the air that was put into your GI tract during the procedure and reduce the bloating. If you had a lower endoscopy (such as a colonoscopy or flexible sigmoidoscopy) you may notice spotting of blood in your stool or on the toilet paper. Some abdominal soreness may be present for a day or two, also.  DIET: Your first meal following the procedure should be a light meal and then it is ok to progress to your normal diet. A half-sandwich or bowl of soup is an example of a good first meal. Heavy or fried foods are harder to digest and may make you feel nauseous or bloated. Drink plenty of fluids but you should avoid alcoholic beverages for 24 hours.   ACTIVITY: Your care partner should take you home directly after the procedure. You should plan to take it easy, moving slowly for the rest of the day. You can resume normal activity the day after the procedure however YOU SHOULD NOT DRIVE, use power tools, machinery or perform tasks that involve climbing or major physical exertion for 24 hours (because of the sedation medicines used during the test).   SYMPTOMS TO REPORT IMMEDIATELY: A gastroenterologist can be reached at any hour. Please call [phone]  for any of the following symptoms:  Following lower endoscopy (colonoscopy, flexible sigmoidoscopy) Excessive amounts of blood in the stool  Significant tenderness, worsening of abdominal pains  Swelling of the abdomen that is new, acute  Fever of 100 or higher      Diverticulosis Diverticulosis is the condition that  develops when small pouches (diverticula) form in the wall of your colon. Your colon, or large intestine, is where water is absorbed and stool is formed. The pouches form when the inside layer of your colon pushes through weak spots in the outer layers of your colon. CAUSES  No one knows exactly what causes diverticulosis. RISK FACTORS  Being older than 15. Your risk for this condition increases with age. Diverticulosis is rare in people younger than 40 years. By age 50, almost everyone has it.  Eating a low-fiber diet.  Being frequently constipated.  Being overweight.  Not getting enough exercise.  Smoking.  Taking over-the-counter pain medicines, like aspirin and ibuprofen. SYMPTOMS  Most people with diverticulosis do not have symptoms. DIAGNOSIS  Because diverticulosis often has no symptoms, health care providers often discover the condition during an exam for other colon problems. In many cases, a health care provider will diagnose diverticulosis while using a flexible scope to examine the colon (colonoscopy). TREATMENT  If you have never developed an infection related to diverticulosis, you may not need treatment. If you have had an infection before, treatment may include:  Eating more fruits, vegetables, and grains.  Taking a fiber supplement.  Taking a live bacteria supplement (probiotic).  Taking medicine to relax your colon. HOME CARE INSTRUCTIONS   Drink at least 6-8 glasses of water each day to prevent constipation.  Try not to strain when you have a bowel movement.  Keep all follow-up appointments. If you have had an infection before:  Increase the  fiber in your diet as directed by your health care provider or dietitian.  Take a dietary fiber supplement if your health care provider approves.  Only take medicines as directed by your health care provider. SEEK MEDICAL CARE IF:   You have abdominal pain.  You have bloating.  You have cramps.  You have  not gone to the bathroom in 3 days. SEEK IMMEDIATE MEDICAL CARE IF:   Your pain gets worse.  Yourbloating becomes very bad.  You have a fever or chills, and your symptoms suddenly get worse.  You begin vomiting.  You have bowel movements that are bloody or black. MAKE SURE YOU:  Understand these instructions.  Will watch your condition.  Will get help right away if you are not doing well or get worse. Document Released: 11/15/2003 Document Revised: 02/22/2013 Document Reviewed: 01/12/2013 [facility] Patient Information 2015 [place], [place]. This information is not intended to replace advice given to you by your health care provider. Make sure you discuss any questions you have with your health care provider.

## 2014-01-01 NOTE — Progress Notes (Signed)
01/01/14 1450  Reviewed discharged instructions with patient. Patients verbalized understanding of discharge instructions. Copy of discharge instructions and prescription given.

## 2014-01-01 NOTE — Discharge Summary (Signed)
Physician Discharge Summary  Eric Rivers PJA:[id number] DOB: 1930/09/07 DOA: 12/28/2013  PCP: Cathlean Cower, MD  Admit date: 12/28/2013 Discharge date: 01/01/2014  Recommendations for Outpatient Follow-up:  1. Pt will need to follow up with PCP this week 2. Please obtain CBC in one week 3. If cleared by Dr. Jenny Reichmann, restart Coumadin on 01/08/2014 4. Start Lovenox 1 mg/kg every 12 hours starting 01/08/2014, until INR greater than 2.0  Discharge Diagnoses:  Hematochezia /Diverticular Bleed -2 small episodes of rectal bleeding since admission  -Mild drop in hemoglobin since admission, but has remained stable thereafter  -Appreciate GI--> Vitamin K 5 g IV 1 given-->INR 1.67-->1.46 -01/01/2014 colonoscopy--diverticulosis--> Suspect diverticular bleed -hold coumadin  -Hemoglobin has remained stable since admission, no further episodes of hematochezia in the last 48 hours -baseline Hgb 10-11  -on 01/01/2014, the patient was cleared for discharge by the gastroenterology service Coagulopathy  -Restart warfarin when cleared by gastroenterology  -hold coumadin  -gastroenterology recommended follow-up CBC in 1 week and if hemoglobin remains stable, the patient can restart warfarin on 01/08/2014 -The patient was given instructions to start Lovenox 1 mg/kg every 12 hours on 01/08/2014 -He was instructed to follow-up at his Coumadin clinic to monitor his INR and to stop Lovenox when INR is >2.0 -The patient and his POA expressed understanding Chronic atrial fibrillation  -Rate controlled  -Continue metoprolol tartrate  Diabetes mellitus type 2  -10/27/2013 hemoglobin A1c 6.6  -NovoLog sliding scale  -CBGs controlled -patient can restart his home medication after discharge Alcohol dependence  -Patient drinks 2 "cups" of vodka daily  -CIWA  -last drink 12/27/13 PM  Hypertension  -Continue amlodipine and metoprolol tartrate  -blood pressure remained stable Hyperlipidemia    -Continue statin   Discharge Condition: stable  Disposition: home Follow-up Information    Follow up with Silvano Rusk, MD.   Specialty:  Gastroenterology   Why:  Office will contact to arrange CBC (nblood count) in about 1 week   Contact information:   [address] [place] [address] [phone]       Follow up with Cathlean Cower, MD In 1 week.   Specialties:  Internal Medicine, Radiology   Contact information:   [place]  [address] [phone]       Diet:soft Wt Readings from Last 3 Encounters:  12/30/13 100.8 kg (222 lb 3.6 oz)  11/23/13 101.606 kg (224 lb)  11/02/13 101.787 kg (224 lb 6.4 oz)    History of present illness:  80 year old male with a history of alcohol dependence, chronic atrial fibrillation (on Coumadin), diabetes mellitus type 2, hypertension, hyperlipidemia presented with 2 episodes of hematochezia at home on the day of admission. Since admission, the patient has had 2 additional bowel movements with bright red blood although smaller in volume. The patient denied any fevers, chills, dizziness, syncope, shortness of breath, abdominal pain, vomiting, hematemesis. At the time of admission, hemoglobin was 10.5, INR was 3.04, lactic acid 1.62. Gastroenterology was consulted. The patient was given vitamin K 5 mg IV 1. His INR improved. Colonoscopy is planned 01/01/2014.  Colonoscopy revealed moderate diverticulosis. Therefore, it was thought that the patient had a diverticular bleed. Gastroenterology recommended holding warfarin until 01/08/2014.     Consultants: GI--Dr. Carlean Purl  Discharge Exam: Filed Vitals:   01/01/14 1116  BP: 137/70  Pulse: 83  Temp: 98.2 F (36.8 C)  Resp: 20   Filed Vitals:   01/01/14 0845 01/01/14 0848 01/01/14 0920 01/01/14 1116  BP: 94/35  156/66 137/70  Pulse: 56 60 61 83  Temp:   98 F (36.7 C) 98.2 F (36.8 C)  TempSrc:   Oral Oral  Resp: 14 150 20 20  Height:      Weight:       SpO2: 90% 99% 99% 98%   General: A&O x 3, NAD, pleasant, cooperative Cardiovascular: RRR, no rub, no gallop, no S3 Respiratory: CTAB, no wheeze, no rhonchi Abdomen:soft, nontender, nondistended, positive bowel sounds Extremities: 1+ LE edema, No lymphangitis, no petechiae  Discharge Instructions      Discharge Instructions    Diet - low sodium heart healthy    Complete by:  As directed      Discharge instructions    Complete by:  As directed   Do not restart coumadin until 01/08/14. See Dr. Cathlean Cower before restarting coumadin. Start Lovenox 100mg  two times a day on 01/08/14, and stop injections when INR is >2.0     Increase activity slowly    Complete by:  As directed             Medication List    STOP taking these medications        aspirin 325 MG tablet      TAKE these medications        acetaminophen 500 MG tablet  Commonly known as:  TYLENOL  Take 1,000 mg by mouth 3 (three) times daily.     allopurinol 300 MG tablet  Commonly known as:  ZYLOPRIM  Take 1 tablet (300 mg total) by mouth daily.     amLODipine 2.5 MG tablet  Commonly known as:  NORVASC  Take 1 tablet (2.5 mg total) by mouth daily.     atorvastatin 20 MG tablet  Commonly known as:  LIPITOR  Take 1 tablet (20 mg total) by mouth every morning.     CALCIUM 600 + D PO  Take 1 tablet by mouth every morning.     desonide 0.05 % lotion  Commonly known as:  DESOWEN  Apply 1 application topically daily as needed (for face).     enoxaparin 100 MG/ML injection  Commonly known as:  LOVENOX  Inject 1 mL (100 mg total) into the skin every 12 (twelve) hours. Start 01/08/14.  Start taking on:  01/08/2014     finasteride 5 MG tablet  Commonly known as:  PROSCAR  Take 1 tablet (5 mg total) by mouth every morning.     furosemide 40 MG tablet  Commonly known as:  LASIX  Take 40 mg by mouth daily.     JANUVIA 100 MG tablet  Generic drug:  sitaGLIPtin  take 1 tablet by mouth once daily      ketoconazole 2 % shampoo  Commonly known as:  NIZORAL  Apply 1 application topically 4 (four) times a week.     metoprolol tartrate 25 MG tablet  Commonly known as:  LOPRESSOR  Take 1 tablet (25 mg total) by mouth 2 (two) times daily.     ONCOVITE PO  Take 1 tablet by mouth daily.     pantoprazole 40 MG tablet  Commonly known as:  PROTONIX  Take 1 tablet (40 mg total) by mouth daily.     potassium chloride 10 MEQ tablet  Commonly known as:  K-DUR,KLOR-CON  Take 1 tablet (10 mEq total) by mouth daily.     tamsulosin 0.4 MG Caps capsule  Commonly known as:  FLOMAX  Take 0.4 mg by mouth at bedtime.  traZODone 25 mg Tabs tablet  Commonly known as:  DESYREL  Take 25 mg by mouth at bedtime.     warfarin 4 MG tablet  Commonly known as:  COUMADIN  Take as directed by anticoagulation clinic         The results of significant diagnostics from this hospitalization (including imaging, microbiology, ancillary and laboratory) are listed below for reference.    Significant Diagnostic Studies: No results found.   Microbiology: No results found for this or any previous visit (from the past 240 hour(s)).   Labs: Basic Metabolic Panel:  Recent Labs Lab 12/28/13 2212 12/29/13 0454 12/31/13 0520  NA 138 142 141  K 4.2 3.8 3.9  CL 96 102 104  CO2 27 29 25   GLUCOSE 124* 104* 116*  BUN 21 18 13   CREATININE 1.03 1.03 0.94  CALCIUM 9.4 9.1 9.2   Liver Function Tests:  Recent Labs Lab 12/28/13 2212 12/29/13 0454  AST 26 23  ALT 15 13  ALKPHOS 88 77  BILITOT 1.1 1.5*  PROT 7.5 6.7  ALBUMIN 4.0 3.6   No results for input(s): LIPASE, AMYLASE in the last 168 hours. No results for input(s): AMMONIA in the last 168 hours. CBC:  Recent Labs Lab 12/29/13 0454 12/29/13 0844 12/29/13 1304 12/29/13 1701  12/30/13 2210 12/31/13 0520 12/31/13 1050 12/31/13 2201 01/01/14 1028  WBC 7.9 8.4 6.8 7.0  --   --  9.7  --   --   --   HGB 9.6* 10.7* 9.6* 10.2*  < > 9.6*  9.6* 10.0* 10.6* 9.3*  HCT 30.3* 33.6* 29.8* 31.6*  < > 30.3* 30.6* 32.0* 33.8* 29.4*  MCV 93.8 92.3 92.5 92.7  --   --  95.6  --   --   --   PLT 181 200 175 180  --   --  178  --   --   --   < > = values in this interval not displayed. Cardiac Enzymes: No results for input(s): CKTOTAL, CKMB, CKMBINDEX, TROPONINI in the last 168 hours. BNP: Invalid input(s): POCBNP CBG:  Recent Labs Lab 12/31/13 1139 12/31/13 1604 12/31/13 2123 01/01/14 0723 01/01/14 1150  GLUCAP 148* 120* 136* 125* 150*    Time coordinating discharge:  Greater than 30 minutes  Signed:  Armida Vickroy, DO [facility] Pager: [phone] 01/01/2014, 2:12 PM

## 2014-01-01 NOTE — Op Note (Signed)
[facility] [place], [address]   COLONOSCOPY PROCEDURE REPORT  PATIENT: Eric Rivers, Eric Rivers  MR#: [id number] BIRTHDATE: 06/10/1930 , 85  yrs. old GENDER: male ENDOSCOPIST: Gatha Mayer, MD, [facility] PROCEDURE DATE:  01/01/2014 PROCEDURE:   Colonoscopy, diagnostic First Screening Colonoscopy - Avg.  risk and is 50 yrs.  old or older - No.  Prior Negative Screening - Now for repeat screening. N/A  History of Adenoma - Now for follow-up colonoscopy & has been > or = to 3 yrs.  N/A ASA CLASS:   Class III INDICATIONS:hematochezia.   LGIB on warfarin INR 3.9 MEDICATIONS: Fentanyl 50 mcg IV and Versed 4 mg IV  DESCRIPTION OF PROCEDURE:   After the risks benefits and alternatives of the procedure were thoroughly explained, informed consent was obtained.  The digital rectal exam revealed no abnormalities of the rectum and revealed the prostate was not enlarged.   The EC-[id number] ([id number])  endoscope was introduced through the anus and advanced to the cecum, which was identified by both the appendix and ileocecal valve. No adverse events experienced.   The quality of the prep was adequate, using MoviPrep The instrument was then slowly withdrawn as the colon was fully examined.      COLON FINDINGS: There was moderate diverticulosis noted in the sigmoid colon.   The examination was otherwise normal.  Retroflexed views revealed no abnormalities. The time to cecum=2 minutes 0 seconds.  Withdrawal time=11 minutes 0 seconds.  The scope was withdrawn and the procedure completed. COMPLICATIONS: There were no immediate complications.  ENDOSCOPIC IMPRESSION: 1.   Moderate diverticulosis was noted in the sigmoid colon 2.   The examination was otherwise normal - adequate prep - I conclude he bled from the diverticulosis  RECOMMENDATIONS: Off warfarin until 11/8 My office to arrange a CBC for 1 week nystatin-triamcinolon ordered for perianal dermatitis Low fiber  diet for a few weeks Home today ok - no driving, operating machinery x 24 hrs - post colonoscopy instructions added to dc instructions eSigned:  Gatha Mayer, MD, [facility] 01/01/2014 8:43 AM   cc: The Patient

## 2014-01-02 ENCOUNTER — Encounter (HOSPITAL_COMMUNITY): Payer: Self-pay | Admitting: Internal Medicine

## 2014-01-02 ENCOUNTER — Telehealth: Payer: Self-pay | Admitting: *Deleted

## 2014-01-02 NOTE — Telephone Encounter (Signed)
Transition Care Management Follow-up Telephone Call D/C 01/01/14  How have you been since you were released from the hospital? Pt states he is doing well no complaints   Do you understand why you were in the hospital? YES, he understood why he was admitted   Do you understand the discharge instrcutions? YES, he understood d/c summary we went over too  Items Reviewed:  Medications reviewed: YES, reviewed  Allergies reviewed: YES, reviewed  Dietary changes reviewed: low sodium, heart healthy  Referrals reviewed: No referrals needed   Functional Questionnaire:   Activities of Daily Living (ADLs):   He states he are independent in the following: ambulation, bathing and hygiene and feeding He states he doesn't need any assistance    Any transportation issues/concerns?: NO   Any patient concerns? NO   Confirmed importance and date/time of follow-up visits scheduled: YES, made appt with Dr. Jenny Reichmann 01/06/14   Confirmed with patient if condition begins to worsen call PCP or go to the ER.  Patient was given the Call-a-Nurse line [phone]: YES

## 2014-01-06 ENCOUNTER — Other Ambulatory Visit (INDEPENDENT_AMBULATORY_CARE_PROVIDER_SITE_OTHER): Payer: Medicare Other

## 2014-01-06 ENCOUNTER — Encounter: Payer: Self-pay | Admitting: Internal Medicine

## 2014-01-06 ENCOUNTER — Ambulatory Visit (INDEPENDENT_AMBULATORY_CARE_PROVIDER_SITE_OTHER): Payer: Medicare Other | Admitting: Internal Medicine

## 2014-01-06 VITALS — BP 138/80 | HR 40 | Temp 97.5°F | Ht 62.0 in | Wt 227.2 lb

## 2014-01-06 DIAGNOSIS — Z7901 Long term (current) use of anticoagulants: Secondary | ICD-10-CM

## 2014-01-06 DIAGNOSIS — D62 Acute posthemorrhagic anemia: Secondary | ICD-10-CM

## 2014-01-06 DIAGNOSIS — R001 Bradycardia, unspecified: Secondary | ICD-10-CM | POA: Diagnosis not present

## 2014-01-06 LAB — CBC WITH DIFFERENTIAL/PLATELET
Basophils Absolute: 0 K/uL (ref 0.0–0.1)
Basophils Relative: 0.2 % (ref 0.0–3.0)
Eosinophils Absolute: 0.2 K/uL (ref 0.0–0.7)
Eosinophils Relative: 2.6 % (ref 0.0–5.0)
HCT: 32.9 % — ABNORMAL LOW (ref 39.0–52.0)
Hemoglobin: 10.3 g/dL — ABNORMAL LOW (ref 13.0–17.0)
Lymphocytes Relative: 12.3 % (ref 12.0–46.0)
Lymphs Abs: 1.1 K/uL (ref 0.7–4.0)
MCHC: 31.3 g/dL (ref 30.0–36.0)
MCV: 94.9 fl (ref 78.0–100.0)
Monocytes Absolute: 1 K/uL (ref 0.1–1.0)
Monocytes Relative: 11.3 % (ref 3.0–12.0)
Neutro Abs: 6.4 K/uL (ref 1.4–7.7)
Neutrophils Relative %: 73.6 % (ref 43.0–77.0)
Platelets: 218 K/uL (ref 150.0–400.0)
RBC: 3.47 Mil/uL — ABNORMAL LOW (ref 4.22–5.81)
RDW: 18.3 % — ABNORMAL HIGH (ref 11.5–15.5)
WBC: 8.7 K/uL (ref 4.0–10.5)

## 2014-01-06 LAB — BASIC METABOLIC PANEL WITH GFR
BUN: 15 mg/dL (ref 6–23)
CO2: 30 meq/L (ref 19–32)
Calcium: 9.5 mg/dL (ref 8.4–10.5)
Chloride: 102 meq/L (ref 96–112)
Creatinine, Ser: 0.9 mg/dL (ref 0.4–1.5)
GFR: 82.78 mL/min
Glucose, Bld: 105 mg/dL — ABNORMAL HIGH (ref 70–99)
Potassium: 4.6 meq/L (ref 3.5–5.1)
Sodium: 139 meq/L (ref 135–145)

## 2014-01-06 NOTE — Progress Notes (Signed)
Subjective:    Patient ID: Eric Rivers, male    DOB: Jan 11, 1931, 80 y.o.   MRN: [id number]  HPI  Here to f/u recent LGI bleed - 2 small episodes of rectal bleeding since admission  -Mild drop in hemoglobin since admission, but has remained stable thereafter  -Appreciate GI--> Vitamin K 5 g IV 1 given-->INR 1.67-->1.46 -01/01/2014 colonoscopy--diverticulosis--> Suspect diverticular bleed -hold coumadin  Hemoglobin has remained stable since admission, no further episodes of hematochezia in the last 48 hours -baseline Hgb 10-11  -on 01/01/2014, the patient was cleared for discharge by the gastroenterology service Since home has had no further bleeding on ASA only.  Pt denies chest pain, increased sob or doe, wheezing, orthopnea, PND, increased LE swelling, palpitations, dizziness or syncope.  Has had mild allergy symptoms, with right sided ear popping and crackling without fever, pain, hearing loss, vertigo.  Noted Bradycardia on intake VS today, pt remains asympt. Past Medical History  Diagnosis Date  . Whooping cough   . Atrial fibrillation      Chronic,   24 hour holter, September, 2011.... atrial fib rate is controlled.... there is some bradycardia but  no marked pauses  . Diabetes mellitus     type 2  . Hypertension   . DDD (degenerative disc disease)     lumbar spine  . GERD (gastroesophageal reflux disease)   . Gout   . Fluid overload   . ACE-inhibitor cough   . Shortness of breath     on exertion  . Aortic stenosis     mild....echo... september... 2010/ mild... echo.[name] KitchenMarland KitchenDec, 2011  . Pulmonary hypertension     54mmHg, echo, 01/2010  . Hyperlipidemia   . Hx of colonoscopy     approx. 10 years  . Warfarin anticoagulation     Atrial fib  . Ejection fraction     EF 60%, echo, 01/2010  . Normal nuclear stress test     06/2005 , also ABI normal 2007  . Hypercholesterolemia   . Fracture of metacarpal bone 11/30/11  . Aortic dissection     Surgical repair February,  2014  . Mitral regurgitation   . Diabetes mellitus with neuropathy 12/04/2006    Qualifier: Diagnosis of  By: Linda Hedges MD, Heinz Knuckles  medications - DPP4   . Bladder cancer     recurrence with TUR-B March '09  . Prostate cancer     radiation tx.  . Diverticulosis of colon with hemorrhage 01/01/2014   Past Surgical History  Procedure Laterality Date  . Lumbar laminectomy      '02  . Tur-bt '06, '09    . Bladder cancer biopsy  10/16/2010    negative for malignancy  . Bladder microscopic  04/13/2007    high grade papillary urothelial lesions  . 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 Prostate biopsy  09/25/2011    GLEASON 3+3=6 AND 4+3=7  . Ascending aortic root replacement N/A 04/15/2012    Procedure: ASCENDING AORTIC ROOT REPLACEMENT;  Surgeon: Gaye Pollack, MD;  Location: [facility];  Service: Open Heart Surgery;  Laterality: N/A;  . Tee without cardioversion N/A 04/15/2012    Procedure: TRANSESOPHAGEAL ECHOCARDIOGRAM (TEE);  Surgeon: Gaye Pollack, MD;  Location: [facility];  Service: Open Heart Surgery;  Laterality: N/A;  . Exploration post operative open heart  04/2012  . Cystoscopy/retrograde/ureteroscopy Bilateral 04/04/2013    Procedure: CYSTOSCOPY WITH RETROGRADE PYELOGRAM AND BLADDER BIOPSY;  Surgeon: Dennard Schaumann  [name], MD;  Location: [facility];  Service: Urology;  Laterality: Bilateral;  . Herniatic disc surgery    . Colonoscopy N/A 01/01/2014    Procedure: COLONOSCOPY;  Surgeon: Gatha Mayer, MD;  Location: [facility];  Service: Endoscopy;  Laterality: N/A;    reports that he quit smoking about 38 years ago. His smoking use included Cigarettes. He smoked 1.00 pack per day. He has never used smokeless tobacco. He reports that he drinks about 7.0 oz of alcohol per week. He reports that he does not use illicit drugs. family history includes Cancer in his father; Heart disease in his mother; Hypertension in his mother; Prostate cancer (age of onset: 76)  in his father; Stroke in his father. No Known Allergies Current Outpatient Prescriptions on File Prior to Visit  Medication Sig Dispense Refill  . acetaminophen (TYLENOL) 500 MG tablet Take 1,000 mg by mouth 3 (three) times daily.    [name] Kitchen allopurinol (ZYLOPRIM) 300 MG tablet Take 1 tablet (300 mg total) by mouth daily. 90 tablet 1  . amLODipine (NORVASC) 2.5 MG tablet Take 1 tablet (2.5 mg total) by mouth daily. 90 tablet 3  . atorvastatin (LIPITOR) 20 MG tablet Take 1 tablet (20 mg total) by mouth every morning. 90 tablet 1  . Calcium Carb-Cholecalciferol (CALCIUM 600 + D PO) Take 1 tablet by mouth every morning.    [name] Kitchen desonide (DESOWEN) 0.05 % lotion Apply 1 application topically daily as needed (for face).    Derrill Memo ON 01/08/2014] enoxaparin (LOVENOX) 100 MG/ML injection Inject 1 mL (100 mg total) into the skin every 12 (twelve) hours. Start 01/08/14. 14 Syringe 0  . finasteride (PROSCAR) 5 MG tablet Take 1 tablet (5 mg total) by mouth every morning. 90 tablet 3  . furosemide (LASIX) 40 MG tablet Take 40 mg by mouth daily.    [name] Kitchen JANUVIA 100 MG tablet take 1 tablet by mouth once daily 90 tablet 3  . ketoconazole (NIZORAL) 2 % shampoo Apply 1 application topically 4 (four) times a week.    . metoprolol tartrate (LOPRESSOR) 25 MG tablet Take 1 tablet (25 mg total) by mouth 2 (two) times daily. 180 tablet 3  . Multiple Vitamins-Minerals (ONCOVITE PO) Take 1 tablet by mouth daily.     . pantoprazole (PROTONIX) 40 MG tablet Take 1 tablet (40 mg total) by mouth daily. 90 tablet 1  . potassium chloride (K-DUR,KLOR-CON) 10 MEQ tablet Take 1 tablet (10 mEq total) by mouth daily. 90 tablet 2  . Tamsulosin HCl (FLOMAX) 0.4 MG CAPS Take 0.4 mg by mouth at bedtime.     . traZODone (DESYREL) 25 mg TABS tablet Take 25 mg by mouth at bedtime.    [name] Kitchen warfarin (COUMADIN) 4 MG tablet Take as directed by anticoagulation clinic 105 tablet 1   No current facility-administered medications on file prior to visit.     Review of Systems  Constitutional: Negative for unusual diaphoresis or other sweats  HENT: Negative for ringing in ear Eyes: Negative for double vision or worsening visual disturbance.  Respiratory: Negative for choking and stridor.   Gastrointestinal: Negative for vomiting or other signifcant bowel change Genitourinary: Negative for hematuria or decreased urine volume.  Musculoskeletal: Negative for other MSK pain or swelling Skin: Negative for color change and worsening wound.  Neurological: Negative for tremors and numbness other than noted  Psychiatric/Behavioral: Negative for decreased concentration or agitation other than above       Objective:   Physical Exam BP 138/80 mmHg  Pulse 40  Temp(Src) 97.5 F (36.4 C) (Oral)  Ht 5\' 2"  (1.575 m)  Wt 227 lb 4 oz (103.08 kg)  BMI 41.55 kg/m2  SpO2 98% BP 138/80 mmHg  Pulse 40  Temp(Src) 97.5 F (36.4 C) (Oral)  Ht 5\' 2"  (1.575 m)  Wt 227 lb 4 oz (103.08 kg)  BMI 41.55 kg/m2  SpO2 98% VS noted, not ill appearing, somewhat fatigue appearing Constitutional: Pt appears well-developed, well-nourished.  HENT: Head: NCAT.  Right Ear: External ear normal.  Left Ear: External ear normal.  Eyes: . Pupils are equal, round, and reactive to light. Conjunctivae and EOM are normal Neck: Normal range of motion. Neck supple.  Cardiovascular: Normal rate and regular rhythm.   Pulmonary/Chest: Effort normal and breath sounds normal.  Abd:  Soft, NT, ND, + BS Neurological: Pt is alert. Not confused , motor grossly intact Skin: Skin is warm. No rash, right > left trace to 1+ LE edema Psychiatric: Pt behavior is normal. No agitation.     Assessment & Plan:

## 2014-01-06 NOTE — Progress Notes (Signed)
Pre visit review using our clinic review tool, if applicable. No additional management support is needed unless otherwise documented below in the visit note. 

## 2014-01-06 NOTE — Patient Instructions (Addendum)
Your EKG showed significant bradycardia today (slow heart rate)  Please stop your metoprolol  Please return on Monday or Tues next wk for a Nurse Visit appt to re-check the BP and Heart Rate  OK to continue your current blood thinner plan (pending the blood count today)  Please go to the LAB in the Basement (turn left off the elevator) for the tests to be done today  You will be contacted by phone if any changes need to be made immediately.  Otherwise, you will receive a letter about your results with an explanation, but please check with MyChart first.  Please continue all other medications as before, and refills have been done if requested.  Please have the pharmacy call with any other refills you may need.  Please keep your appointments with your specialists as you may have planned

## 2014-01-07 NOTE — Assessment & Plan Note (Addendum)
Ok to re-start nov 8 as planned pending cbc today with lovenox bridge/ASA, stable overall by history and exam,  and pt to continue medical treatment as before,  to f/u any worsening symptoms or concerns

## 2014-01-07 NOTE — Assessment & Plan Note (Signed)
ECG reviewed as per emr - HR 38, asympt, pt adamant he is not going back to ER or hospital, to d/c metoprolol, f/u BP/HR with nurse visit in 3 -4 days

## 2014-01-07 NOTE — Assessment & Plan Note (Addendum)
stable overall by history and exam, recent data reviewed with pt, - hgb 9.3, and pt to continue medical treatment as before,  to f/u any worsening symptoms or concerns, for f/u cbc today

## 2014-01-09 ENCOUNTER — Ambulatory Visit (INDEPENDENT_AMBULATORY_CARE_PROVIDER_SITE_OTHER): Payer: Medicare Other | Admitting: Geriatric Medicine

## 2014-01-09 VITALS — BP 132/74

## 2014-01-09 DIAGNOSIS — I10 Essential (primary) hypertension: Secondary | ICD-10-CM

## 2014-01-10 ENCOUNTER — Encounter: Payer: Self-pay | Admitting: Internal Medicine

## 2014-01-17 ENCOUNTER — Ambulatory Visit (INDEPENDENT_AMBULATORY_CARE_PROVIDER_SITE_OTHER): Payer: Medicare Other

## 2014-01-17 ENCOUNTER — Telehealth: Payer: Self-pay | Admitting: Family

## 2014-01-17 DIAGNOSIS — Z7901 Long term (current) use of anticoagulants: Secondary | ICD-10-CM

## 2014-01-17 DIAGNOSIS — I482 Chronic atrial fibrillation, unspecified: Secondary | ICD-10-CM

## 2014-01-17 DIAGNOSIS — I272 Pulmonary hypertension, unspecified: Secondary | ICD-10-CM

## 2014-01-17 DIAGNOSIS — I270 Primary pulmonary hypertension: Secondary | ICD-10-CM | POA: Diagnosis not present

## 2014-01-17 DIAGNOSIS — Z5181 Encounter for therapeutic drug level monitoring: Secondary | ICD-10-CM

## 2014-01-17 LAB — POCT INR: INR: 1.6

## 2014-01-17 NOTE — Telephone Encounter (Signed)
Which 2 days you want him to do the 1/2 tablet and when should he recheck?

## 2014-01-17 NOTE — Telephone Encounter (Signed)
-----   Message from Scarlette Calico, [place] sent at 01/17/2014  4:01 PM EST ----- Kelli Churn wanted me to forward you the INR on pt.  If you could look at the visit.  It was 1.6 and he takes 4 mg M-F.  Please let me know what you want to change it to and call pt.  Thanks!

## 2014-01-17 NOTE — Telephone Encounter (Signed)
Recommend increasing to 30 mg weekly - 1/2 tablet 2 x per week and 1 tablet 5 days per week.

## 2014-01-17 NOTE — Telephone Encounter (Signed)
Change since 4 mg tablets to 1.5 tablets on Monday and Friday and then 1 tablet daily otherwise. Recheck in 2 weeks.

## 2014-01-18 NOTE — Telephone Encounter (Signed)
Pt verbalized understanding and had no questions.  He has an appt with Dr Jenny Reichmann on 01/31/14.  Nothing further is needed.  FYI sent to Dr Jenny Reichmann and Kelli Churn, RN

## 2014-01-19 DIAGNOSIS — D485 Neoplasm of uncertain behavior of skin: Secondary | ICD-10-CM | POA: Diagnosis not present

## 2014-01-20 DIAGNOSIS — L98499 Non-pressure chronic ulcer of skin of other sites with unspecified severity: Secondary | ICD-10-CM | POA: Diagnosis not present

## 2014-01-30 DIAGNOSIS — D62 Acute posthemorrhagic anemia: Secondary | ICD-10-CM | POA: Diagnosis not present

## 2014-01-30 DIAGNOSIS — R001 Bradycardia, unspecified: Secondary | ICD-10-CM | POA: Diagnosis not present

## 2014-01-30 DIAGNOSIS — Z7901 Long term (current) use of anticoagulants: Secondary | ICD-10-CM | POA: Diagnosis not present

## 2014-01-31 ENCOUNTER — Ambulatory Visit (INDEPENDENT_AMBULATORY_CARE_PROVIDER_SITE_OTHER): Payer: Medicare Other | Admitting: Internal Medicine

## 2014-01-31 ENCOUNTER — Other Ambulatory Visit (INDEPENDENT_AMBULATORY_CARE_PROVIDER_SITE_OTHER): Payer: Medicare Other

## 2014-01-31 ENCOUNTER — Encounter: Payer: Self-pay | Admitting: Internal Medicine

## 2014-01-31 VITALS — BP 118/78 | HR 81 | Temp 97.6°F | Ht 68.0 in | Wt 225.5 lb

## 2014-01-31 DIAGNOSIS — Z7901 Long term (current) use of anticoagulants: Secondary | ICD-10-CM

## 2014-01-31 DIAGNOSIS — R001 Bradycardia, unspecified: Secondary | ICD-10-CM

## 2014-01-31 DIAGNOSIS — I10 Essential (primary) hypertension: Secondary | ICD-10-CM | POA: Diagnosis not present

## 2014-01-31 LAB — PROTIME-INR
INR: 3 ratio — AB (ref 0.8–1.0)
Prothrombin Time: 31.9 s — ABNORMAL HIGH (ref 9.6–13.1)

## 2014-01-31 NOTE — Progress Notes (Signed)
Pre visit review using our clinic review tool, if applicable. No additional management support is needed unless otherwise documented below in the visit note. 

## 2014-01-31 NOTE — Progress Notes (Signed)
Subjective:    Patient ID: Eric Rivers, male    DOB: Jul 26, 1930, 80 y.o.   MRN: [id number]  HPI  Here to f/u, overall doing ok. Did f/u with VS after alst visit, but [facility] staff did not record HR. Still off the metoprolol.  Pt denies chest pain, increased sob or doe, wheezing, orthopnea, PND, increased LE swelling, palpitations, dizziness or syncope.Pt denies new neurological symptoms such as new headache, or facial or extremity weakness or numbness   Pt denies polydipsia, polyuria.   Last INR done nov 17 with coumadin changed to; Change since 4 mg tablets to 1.5 tablets on Monday and Friday and then 1 tablet daily otherwise. Recheck in 2 weeks. No overt bleeding or bruising.  Does have small subq hematomas at lovenox injection site.  Due for f/u coumadin clinic at dec 21 as he was supposed to do this after his cruise, but he cancelloed his cruise, now here today.     Past Medical History  Diagnosis Date  . Whooping cough   . Atrial fibrillation      Chronic,   24 hour holter, September, 2011.... atrial fib rate is controlled.... there is some bradycardia but  no marked pauses  . Diabetes mellitus     type 2  . Hypertension   . DDD (degenerative disc disease)     lumbar spine  . GERD (gastroesophageal reflux disease)   . Gout   . Fluid overload   . ACE-inhibitor cough   . Shortness of breath     on exertion  . Aortic stenosis     mild....echo... september... 2010/ mild... echo.[name] KitchenMarland KitchenDec, 2011  . Pulmonary hypertension     43mmHg, echo, 01/2010  . Hyperlipidemia   . Hx of colonoscopy     approx. 10 years  . Warfarin anticoagulation     Atrial fib  . Ejection fraction     EF 60%, echo, 01/2010  . Normal nuclear stress test     06/2005 , also ABI normal 2007  . Hypercholesterolemia   . Fracture of metacarpal bone 11/30/11  . Aortic dissection     Surgical repair February, 2014  . Mitral regurgitation   . Diabetes mellitus with neuropathy 12/04/2006    Qualifier: Diagnosis  of  By: Linda Hedges MD, Heinz Knuckles  medications - DPP4   . Bladder cancer     recurrence with TUR-B March '09  . Prostate cancer     radiation tx.  . Diverticulosis of colon with hemorrhage 01/01/2014   Past Surgical History  Procedure Laterality Date  . Lumbar laminectomy      '02  . Tur-bt '06, '09    . Bladder cancer biopsy  10/16/2010    negative for malignancy  . Bladder microscopic  04/13/2007    high grade papillary urothelial lesions  . 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 Prostate biopsy  09/25/2011    GLEASON 3+3=6 AND 4+3=7  . Ascending aortic root replacement N/A 04/15/2012    Procedure: ASCENDING AORTIC ROOT REPLACEMENT;  Surgeon: Gaye Pollack, MD;  Location: [facility];  Service: Open Heart Surgery;  Laterality: N/A;  . Tee without cardioversion N/A 04/15/2012    Procedure: TRANSESOPHAGEAL ECHOCARDIOGRAM (TEE);  Surgeon: Gaye Pollack, MD;  Location: [facility];  Service: Open Heart Surgery;  Laterality: N/A;  . Exploration post operative open heart  04/2012  . Cystoscopy/retrograde/ureteroscopy Bilateral 04/04/2013    Procedure: CYSTOSCOPY WITH RETROGRADE PYELOGRAM  AND BLADDER BIOPSY;  Surgeon: Molli Hazard, MD;  Location: [facility];  Service: Urology;  Laterality: Bilateral;  . Herniatic disc surgery    . Colonoscopy N/A 01/01/2014    Procedure: COLONOSCOPY;  Surgeon: Gatha Mayer, MD;  Location: [facility];  Service: Endoscopy;  Laterality: N/A;    reports that he quit smoking about 38 years ago. His smoking use included Cigarettes. He smoked 1.00 pack per day. He has never used smokeless tobacco. He reports that he drinks about 7.0 oz of alcohol per week. He reports that he does not use illicit drugs. family history includes Cancer in his father; Heart disease in his mother; Hypertension in his mother; Prostate cancer (age of onset: 23) in his father; Stroke in his father. Allergies  Allergen Reactions  . Metoprolol Other (See Comments)      Marked bradycardia at 25 mg bid   Current Outpatient Prescriptions on File Prior to Visit  Medication Sig Dispense Refill  . acetaminophen (TYLENOL) 500 MG tablet Take 1,000 mg by mouth 3 (three) times daily.    [name] Kitchen allopurinol (ZYLOPRIM) 300 MG tablet Take 1 tablet (300 mg total) by mouth daily. 90 tablet 1  . amLODipine (NORVASC) 2.5 MG tablet Take 1 tablet (2.5 mg total) by mouth daily. 90 tablet 3  . aspirin 325 MG tablet Take 325 mg by mouth daily.    [name] Kitchen atorvastatin (LIPITOR) 20 MG tablet Take 1 tablet (20 mg total) by mouth every morning. 90 tablet 1  . Calcium Carb-Cholecalciferol (CALCIUM 600 + D PO) Take 1 tablet by mouth every morning.    [name] Kitchen desonide (DESOWEN) 0.05 % lotion Apply 1 application topically daily as needed (for face).    . finasteride (PROSCAR) 5 MG tablet Take 1 tablet (5 mg total) by mouth every morning. 90 tablet 3  . furosemide (LASIX) 40 MG tablet Take 40 mg by mouth daily.    [name] Kitchen JANUVIA 100 MG tablet take 1 tablet by mouth once daily 90 tablet 3  . ketoconazole (NIZORAL) 2 % shampoo Apply 1 application topically 4 (four) times a week.    . Multiple Vitamins-Minerals (ONCOVITE PO) Take 1 tablet by mouth daily.     . pantoprazole (PROTONIX) 40 MG tablet Take 1 tablet (40 mg total) by mouth daily. 90 tablet 1  . potassium chloride (K-DUR,KLOR-CON) 10 MEQ tablet Take 1 tablet (10 mEq total) by mouth daily. 90 tablet 2  . Tamsulosin HCl (FLOMAX) 0.4 MG CAPS Take 0.4 mg by mouth at bedtime.     . traZODone (DESYREL) 25 mg TABS tablet Take 25 mg by mouth at bedtime.    [name] Kitchen warfarin (COUMADIN) 4 MG tablet Take as directed by anticoagulation clinic 105 tablet 1   No current facility-administered medications on file prior to visit.   Review of Systems  Constitutional: Negative for unusual diaphoresis or other sweats  HENT: Negative for ringing in ear Eyes: Negative for double vision or worsening visual disturbance.  Respiratory: Negative for choking and stridor.    Gastrointestinal: Negative for vomiting or other signifcant bowel change Genitourinary: Negative for hematuria or decreased urine volume.  Musculoskeletal: Negative for other MSK pain or swelling Skin: Negative for color change and worsening wound.  Neurological: Negative for tremors and numbness other than noted  Psychiatric/Behavioral: Negative for decreased concentration or agitation other than above       Objective:   Physical Exam BP 118/78 mmHg  Pulse 81  Temp(Src) 97.6 F (36.4 C) (Oral)  Ht 5'  8" (1.727 m)  Wt 225 lb 8 oz (102.286 kg)  BMI 34.30 kg/m2  SpO2 95% VS noted,  Constitutional: Pt appears well-developed, well-nourished.  HENT: Head: NCAT.  Right Ear: External ear normal.  Left Ear: External ear normal.  Eyes: . Pupils are equal, round, and reactive to light. Conjunctivae and EOM are normal Neck: Normal range of motion. Neck supple.  Cardiovascular: Normal rate and regular rhythm.   Pulmonary/Chest: Effort normal and breath sounds normal.  Abd:  Soft, NT, ND, + BS Neurological: Pt is alert. Not confused , motor grossly intact Skin: Skin is warm. No rash Psychiatric: Pt behavior is normal. No agitation.     Assessment & Plan:

## 2014-01-31 NOTE — Patient Instructions (Signed)
Please continue all other medications as before, and refills have been done if requested.  Please have the pharmacy call with any other refills you may need.  Please keep your appointments with your specialists as you may have planned  Please go to the LAB in the Basement (turn left off the elevator) for the tests to be done today  You will be contacted by phone if any changes need to be made immediately.  Otherwise, you will receive a letter about your results with an explanation, but please check with MyChart first.  Please remember to sign up for MyChart if you have not done so, as this will be important to you in the future with finding out test results, communicating by private email, and scheduling acute appointments online when needed.  Please return in 6 months, or sooner if needed  Good Luck on your 8 wk cruise.

## 2014-02-05 NOTE — Assessment & Plan Note (Signed)
Improved off beta blocker,  to f/u any worsening symptoms or concerns, remains asymptomatic

## 2014-02-05 NOTE — Assessment & Plan Note (Signed)
stable overall by history and exam, recent data reviewed with pt, and pt to continue medical treatment as before,  to f/u any worsening symptoms or concerns BP Readings from Last 3 Encounters:  01/31/14 118/78  01/09/14 132/74  01/06/14 138/80

## 2014-02-05 NOTE — Assessment & Plan Note (Signed)
For f/u INR,  to f/u any worsening symptoms or concerns 

## 2014-02-20 ENCOUNTER — Ambulatory Visit (INDEPENDENT_AMBULATORY_CARE_PROVIDER_SITE_OTHER): Payer: Medicare Other | Admitting: Family

## 2014-02-20 DIAGNOSIS — Z5181 Encounter for therapeutic drug level monitoring: Secondary | ICD-10-CM | POA: Diagnosis not present

## 2014-02-20 DIAGNOSIS — I270 Primary pulmonary hypertension: Secondary | ICD-10-CM | POA: Diagnosis not present

## 2014-02-20 DIAGNOSIS — I482 Chronic atrial fibrillation, unspecified: Secondary | ICD-10-CM

## 2014-02-20 DIAGNOSIS — I272 Pulmonary hypertension, unspecified: Secondary | ICD-10-CM

## 2014-02-20 LAB — POCT INR: INR: 2.3

## 2014-02-20 NOTE — Patient Instructions (Signed)
Continue 6 mg of coumadin on Mondays and Fridays. 4mg  all other days. Recheck in 4 weeks. Anticoagulation Dose Instructions as of 02/20/2014      Eric Rivers Tue Wed Thu Fri Sat   New Dose 4 mg 6 mg 4 mg 4 mg 4 mg 6 mg 4 mg    Description        Continue 6 mg of coumadin on Mondays and Fridays. 4mg  all other days. Recheck in 4 weeks.

## 2014-02-21 DIAGNOSIS — C61 Malignant neoplasm of prostate: Secondary | ICD-10-CM | POA: Diagnosis not present

## 2014-02-26 ENCOUNTER — Other Ambulatory Visit: Payer: Self-pay | Admitting: Internal Medicine

## 2014-02-27 DIAGNOSIS — C670 Malignant neoplasm of trigone of bladder: Secondary | ICD-10-CM | POA: Diagnosis not present

## 2014-02-27 DIAGNOSIS — N401 Enlarged prostate with lower urinary tract symptoms: Secondary | ICD-10-CM | POA: Diagnosis not present

## 2014-02-27 DIAGNOSIS — C61 Malignant neoplasm of prostate: Secondary | ICD-10-CM | POA: Diagnosis not present

## 2014-02-27 DIAGNOSIS — R3915 Urgency of urination: Secondary | ICD-10-CM | POA: Diagnosis not present

## 2014-03-16 ENCOUNTER — Ambulatory Visit (INDEPENDENT_AMBULATORY_CARE_PROVIDER_SITE_OTHER): Payer: Medicare Other | Admitting: Family

## 2014-03-16 DIAGNOSIS — I270 Primary pulmonary hypertension: Secondary | ICD-10-CM | POA: Diagnosis not present

## 2014-03-16 DIAGNOSIS — I482 Chronic atrial fibrillation, unspecified: Secondary | ICD-10-CM

## 2014-03-16 DIAGNOSIS — E1151 Type 2 diabetes mellitus with diabetic peripheral angiopathy without gangrene: Secondary | ICD-10-CM | POA: Diagnosis not present

## 2014-03-16 DIAGNOSIS — L603 Nail dystrophy: Secondary | ICD-10-CM | POA: Diagnosis not present

## 2014-03-16 DIAGNOSIS — I272 Pulmonary hypertension, unspecified: Secondary | ICD-10-CM

## 2014-03-16 DIAGNOSIS — I739 Peripheral vascular disease, unspecified: Secondary | ICD-10-CM | POA: Diagnosis not present

## 2014-03-16 DIAGNOSIS — Z5181 Encounter for therapeutic drug level monitoring: Secondary | ICD-10-CM

## 2014-03-16 LAB — POCT INR: INR: 2.6

## 2014-03-16 NOTE — Patient Instructions (Signed)
Continue 6 mg of coumadin on Mondays and Fridays. 4mg  all other days. Recheck in 4 weeks.  Anticoagulation Dose Instructions as of 03/16/2014      Dorene Grebe Tue Wed Thu Fri Sat   New Dose 4 mg 6 mg 4 mg 4 mg 4 mg 6 mg 4 mg    Description        Continue 6 mg of coumadin on Mondays and Fridays. 4mg  all other days. Recheck in 4 weeks.

## 2014-04-10 ENCOUNTER — Other Ambulatory Visit: Payer: Self-pay | Admitting: General Practice

## 2014-04-10 ENCOUNTER — Other Ambulatory Visit: Payer: Self-pay | Admitting: Internal Medicine

## 2014-04-10 MED ORDER — WARFARIN SODIUM 4 MG PO TABS: ORAL_TABLET | ORAL | Status: DC

## 2014-04-13 ENCOUNTER — Ambulatory Visit (INDEPENDENT_AMBULATORY_CARE_PROVIDER_SITE_OTHER): Payer: Medicare Other | Admitting: General Practice

## 2014-04-13 DIAGNOSIS — I482 Chronic atrial fibrillation, unspecified: Secondary | ICD-10-CM

## 2014-04-13 DIAGNOSIS — I270 Primary pulmonary hypertension: Secondary | ICD-10-CM

## 2014-04-13 DIAGNOSIS — I272 Pulmonary hypertension, unspecified: Secondary | ICD-10-CM

## 2014-04-13 DIAGNOSIS — Z5181 Encounter for therapeutic drug level monitoring: Secondary | ICD-10-CM

## 2014-04-13 LAB — POCT INR: INR: 3.5

## 2014-04-13 NOTE — Progress Notes (Signed)
Pre visit review using our clinic review tool, if applicable. No additional management support is needed unless otherwise documented below in the visit note. 

## 2014-04-20 ENCOUNTER — Encounter: Payer: Self-pay | Admitting: Cardiology

## 2014-04-20 ENCOUNTER — Encounter: Payer: Self-pay | Admitting: Internal Medicine

## 2014-04-20 ENCOUNTER — Other Ambulatory Visit (INDEPENDENT_AMBULATORY_CARE_PROVIDER_SITE_OTHER): Payer: Medicare Other

## 2014-04-20 ENCOUNTER — Ambulatory Visit: Payer: BC Managed Care – PPO | Admitting: Cardiology

## 2014-04-20 ENCOUNTER — Ambulatory Visit (INDEPENDENT_AMBULATORY_CARE_PROVIDER_SITE_OTHER): Payer: Medicare Other | Admitting: Cardiology

## 2014-04-20 ENCOUNTER — Ambulatory Visit (INDEPENDENT_AMBULATORY_CARE_PROVIDER_SITE_OTHER): Payer: Medicare Other

## 2014-04-20 ENCOUNTER — Ambulatory Visit (INDEPENDENT_AMBULATORY_CARE_PROVIDER_SITE_OTHER): Payer: Medicare Other | Admitting: Internal Medicine

## 2014-04-20 VITALS — BP 124/60 | HR 88 | Ht 68.0 in | Wt 227.6 lb

## 2014-04-20 VITALS — BP 110/78 | HR 78 | Temp 97.8°F | Wt 227.0 lb

## 2014-04-20 DIAGNOSIS — K922 Gastrointestinal hemorrhage, unspecified: Secondary | ICD-10-CM

## 2014-04-20 DIAGNOSIS — I270 Primary pulmonary hypertension: Secondary | ICD-10-CM | POA: Diagnosis not present

## 2014-04-20 DIAGNOSIS — I5032 Chronic diastolic (congestive) heart failure: Secondary | ICD-10-CM | POA: Diagnosis not present

## 2014-04-20 DIAGNOSIS — I272 Pulmonary hypertension, unspecified: Secondary | ICD-10-CM

## 2014-04-20 DIAGNOSIS — E1140 Type 2 diabetes mellitus with diabetic neuropathy, unspecified: Secondary | ICD-10-CM

## 2014-04-20 DIAGNOSIS — R05 Cough: Secondary | ICD-10-CM

## 2014-04-20 DIAGNOSIS — Z7901 Long term (current) use of anticoagulants: Secondary | ICD-10-CM

## 2014-04-20 DIAGNOSIS — I482 Chronic atrial fibrillation, unspecified: Secondary | ICD-10-CM

## 2014-04-20 DIAGNOSIS — K5731 Diverticulosis of large intestine without perforation or abscess with bleeding: Secondary | ICD-10-CM

## 2014-04-20 DIAGNOSIS — R059 Cough, unspecified: Secondary | ICD-10-CM

## 2014-04-20 DIAGNOSIS — Z5181 Encounter for therapeutic drug level monitoring: Secondary | ICD-10-CM

## 2014-04-20 DIAGNOSIS — I10 Essential (primary) hypertension: Secondary | ICD-10-CM | POA: Diagnosis not present

## 2014-04-20 LAB — CBC WITH DIFFERENTIAL/PLATELET
BASOS ABS: 0 10*3/uL (ref 0.0–0.1)
BASOS PCT: 0.2 % (ref 0.0–3.0)
Eosinophils Absolute: 0.2 10*3/uL (ref 0.0–0.7)
Eosinophils Relative: 2.9 % (ref 0.0–5.0)
HEMATOCRIT: 30.8 % — AB (ref 39.0–52.0)
HEMOGLOBIN: 9.9 g/dL — AB (ref 13.0–17.0)
Lymphocytes Relative: 10 % — ABNORMAL LOW (ref 12.0–46.0)
Lymphs Abs: 0.7 10*3/uL (ref 0.7–4.0)
MCHC: 32.2 g/dL (ref 30.0–36.0)
MCV: 86.8 fl (ref 78.0–100.0)
MONO ABS: 0.5 10*3/uL (ref 0.1–1.0)
Monocytes Relative: 7.5 % (ref 3.0–12.0)
NEUTROS ABS: 5.8 10*3/uL (ref 1.4–7.7)
Neutrophils Relative %: 79.4 % — ABNORMAL HIGH (ref 43.0–77.0)
Platelets: 260 10*3/uL (ref 150.0–400.0)
RBC: 3.54 Mil/uL — AB (ref 4.22–5.81)
RDW: 17.3 % — ABNORMAL HIGH (ref 11.5–15.5)
WBC: 7.3 10*3/uL (ref 4.0–10.5)

## 2014-04-20 LAB — LIPID PANEL
Cholesterol: 81 mg/dL (ref 0–200)
HDL: 34.8 mg/dL — AB (ref >39.00)
LDL CALC: 33 mg/dL (ref 0–99)
NONHDL: 46.2
Total CHOL/HDL Ratio: 2
Triglycerides: 65 mg/dL (ref 0.0–149.0)
VLDL: 13 mg/dL (ref 0.0–40.0)

## 2014-04-20 LAB — BASIC METABOLIC PANEL
BUN: 16 mg/dL (ref 6–23)
CALCIUM: 9.4 mg/dL (ref 8.4–10.5)
CHLORIDE: 101 meq/L (ref 96–112)
CO2: 32 meq/L (ref 19–32)
Creatinine, Ser: 1.02 mg/dL (ref 0.40–1.50)
GFR: 74.36 mL/min (ref >60.00)
Glucose, Bld: 155 mg/dL — ABNORMAL HIGH (ref 70–99)
Potassium: 4.1 mEq/L (ref 3.5–5.1)
SODIUM: 138 meq/L (ref 135–145)

## 2014-04-20 LAB — POCT INR: INR: 2.9

## 2014-04-20 LAB — HEPATIC FUNCTION PANEL
ALT: 11 U/L (ref 0–53)
AST: 19 U/L (ref 0–37)
Albumin: 3.8 g/dL (ref 3.5–5.2)
Alkaline Phosphatase: 80 U/L (ref 39–117)
BILIRUBIN DIRECT: 0.3 mg/dL (ref 0.0–0.3)
Total Bilirubin: 0.9 mg/dL (ref 0.2–1.2)
Total Protein: 7.5 g/dL (ref 6.0–8.3)

## 2014-04-20 LAB — HEMOGLOBIN A1C: Hgb A1c MFr Bld: 6.9 % — ABNORMAL HIGH (ref 4.6–6.5)

## 2014-04-20 MED ORDER — LEVOFLOXACIN 250 MG PO TABS: 250.0000 mg | ORAL_TABLET | Freq: Every day | ORAL | Status: DC

## 2014-04-20 MED ORDER — HYDROCODONE-HOMATROPINE 5-1.5 MG/5ML PO SYRP
5.0000 mL | ORAL_SOLUTION | Freq: Four times a day (QID) | ORAL | Status: DC | PRN
Start: 2014-04-20 — End: 2014-04-20

## 2014-04-20 NOTE — Progress Notes (Signed)
Subjective:    Patient ID: Eric Rivers, male    DOB: 1930-07-24, 81 y.o.   MRN: [id number]  HPI  Here to f/u, did have small BRBPR 1 episode late dec 2015, then started again x 3 small episodes starting Mar 22, 2014 while on a cruise, sheets had BRB, none further after the cruise, and last INR feb 211 - 3.5, and last hgb Jan 06, 2014 - 10.3.  coloinocopy with mod diverticulosis per Dr Carlean Purl, bleeding felt to be related to this.  Seen per coumadin clinic feb 11, INR 3.5, with advice to hold one dose, then re-start usual dosing after.  Pt denies chest pain, increased sob or doe, wheezing, orthopnea, PND, increased LE swelling, palpitations, dizziness or syncope. No weakness, or dizziness on standing, feels tired but attributes to age.  Also - Here with acute onset mild to mod 2-3 days ST, HA, general weakness and malaise, with prod cough greenish sputum, but Pt denies chest pain, increased sob or doe, wheezing, orthopnea, PND, increased LE swelling, palpitations, dizziness or syncope.  Denies urinary symptoms such as dysuria, frequency, urgency, flank pain, hematuria or n/v, fever, chills.  Has nocturia 3-4 times, no change in several years, does not bother him greatly.  No recent falls since stumbled there first day of his recent cruise Has coumadin clinic f/u at 2 wks Past Medical History  Diagnosis Date  . Whooping cough   . Atrial fibrillation      Chronic,   24 hour holter, September, 2011.... atrial fib rate is controlled.... there is some bradycardia but  no marked pauses  . Diabetes mellitus     type 2  . Hypertension   . DDD (degenerative disc disease)     lumbar spine  . GERD (gastroesophageal reflux disease)   . Gout   . Fluid overload   . ACE-inhibitor cough   . Shortness of breath     on exertion  . Aortic stenosis     mild....echo... september... 2010/ mild... echo.[name] KitchenMarland KitchenDec, 2011  . Pulmonary hypertension     64mmHg, echo, 01/2010  . Hyperlipidemia   . Hx of colonoscopy       approx. 10 years  . Warfarin anticoagulation     Atrial fib  . Ejection fraction     EF 60%, echo, 01/2010  . Normal nuclear stress test     06/2005 , also ABI normal 2007  . Hypercholesterolemia   . Fracture of metacarpal bone 11/30/11  . Aortic dissection     Surgical repair February, 2014  . Mitral regurgitation   . Diabetes mellitus with neuropathy 12/04/2006    Qualifier: Diagnosis of  By: Linda Hedges MD, Heinz Knuckles  medications - DPP4   . Bladder cancer     recurrence with TUR-B March '09  . Prostate cancer     radiation tx.  . Diverticulosis of colon with hemorrhage 01/01/2014   Past Surgical History  Procedure Laterality Date  . Lumbar laminectomy      '02  . Tur-bt '06, '09    . Bladder cancer biopsy  10/16/2010    negative for malignancy  . Bladder microscopic  04/13/2007    high grade papillary urothelial lesions  . 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 Prostate biopsy  09/25/2011    GLEASON 3+3=6 AND 4+3=7  . Ascending aortic root replacement N/A 04/15/2012    Procedure: ASCENDING AORTIC ROOT REPLACEMENT;  Surgeon: Gaye Pollack,  MD;  Location: [facility];  Service: Open Heart Surgery;  Laterality: N/A;  . Tee without cardioversion N/A 04/15/2012    Procedure: TRANSESOPHAGEAL ECHOCARDIOGRAM (TEE);  Surgeon: Gaye Pollack, MD;  Location: [facility];  Service: Open Heart Surgery;  Laterality: N/A;  . Exploration post operative open heart  04/2012  . Cystoscopy/retrograde/ureteroscopy Bilateral 04/04/2013    Procedure: CYSTOSCOPY WITH RETROGRADE PYELOGRAM AND BLADDER BIOPSY;  Surgeon: Molli Hazard, MD;  Location: [facility];  Service: Urology;  Laterality: Bilateral;  . Herniatic disc surgery    . Colonoscopy N/A 01/01/2014    Procedure: COLONOSCOPY;  Surgeon: Gatha Mayer, MD;  Location: [facility];  Service: Endoscopy;  Laterality: N/A;    reports that he quit smoking about 39 years ago. His smoking use included Cigarettes. He smoked  1.00 pack per day. He has never used smokeless tobacco. He reports that he drinks about 7.0 oz of alcohol per week. He reports that he does not use illicit drugs. family history includes Cancer in his father; Heart disease in his mother; Hypertension in his mother; Prostate cancer (age of onset: 43) in his father; Stroke in his father. Allergies  Allergen Reactions  . Metoprolol Other (See Comments)    Marked bradycardia at 25 mg bid   Current Outpatient Prescriptions on File Prior to Visit  Medication Sig Dispense Refill  . acetaminophen (TYLENOL) 500 MG tablet Take 1,000 mg by mouth 3 (three) times daily.    [name] Kitchen allopurinol (ZYLOPRIM) 300 MG tablet Take 1 tablet (300 mg total) by mouth daily. 90 tablet 1  . amLODipine (NORVASC) 2.5 MG tablet Take 1 tablet (2.5 mg total) by mouth daily. 90 tablet 3  . aspirin 325 MG tablet Take 325 mg by mouth daily.    [name] Kitchen atorvastatin (LIPITOR) 20 MG tablet Take 1 tablet (20 mg total) by mouth every morning. 90 tablet 1  . Calcium Carb-Cholecalciferol (CALCIUM 600 + D PO) Take 1 tablet by mouth every morning.    [name] Kitchen desonide (DESOWEN) 0.05 % lotion Apply 1 application topically daily as needed (for face).    . finasteride (PROSCAR) 5 MG tablet Take 1 tablet (5 mg total) by mouth every morning. 90 tablet 3  . furosemide (LASIX) 40 MG tablet Take 40 mg by mouth daily.    [name] Kitchen JANUVIA 100 MG tablet take 1 tablet by mouth once daily 90 tablet 3  . ketoconazole (NIZORAL) 2 % shampoo Apply 1 application topically 4 (four) times a week.    . Multiple Vitamins-Minerals (ONCOVITE PO) Take 1 tablet by mouth daily.     . pantoprazole (PROTONIX) 40 MG tablet Take 1 tablet (40 mg total) by mouth daily. 90 tablet 1  . potassium chloride (K-DUR,KLOR-CON) 10 MEQ tablet Take 1 tablet (10 mEq total) by mouth daily. 90 tablet 2  . Tamsulosin HCl (FLOMAX) 0.4 MG CAPS Take 0.4 mg by mouth at bedtime.     . traZODone (DESYREL) 50 MG tablet take 1/2 to 1 tablet by mouth at bedtime if  needed for sleep 90 tablet 1  . warfarin (COUMADIN) 4 MG tablet Take as directed by anticoagulation clinic 105 tablet 3   No current facility-administered medications on file prior to visit.   Review of Systems  Constitutional: Negative for unusual diaphoresis or other sweats  HENT: Negative for ringing in ear Eyes: Negative for double vision or worsening visual disturbance.  Respiratory: Negative for choking and stridor.   Gastrointestinal: Negative for vomiting or other signifcant bowel change Genitourinary:  Negative for hematuria or decreased urine volume.  Musculoskeletal: Negative for other MSK pain or swelling Skin: Negative for color change and worsening wound.  Neurological: Negative for tremors and numbness other than noted  Psychiatric/Behavioral: Negative for decreased concentration or agitation other than above       Objective:   Physical Exam BP 110/78 mmHg  Pulse 78  Temp(Src) 97.8 F (36.6 C) (Oral)  Wt 227 lb (102.967 kg) VS noted, non toxic but ill appearing Constitutional: Pt appears well-developed, well-nourished.  HENT: Head: NCAT.  Right Ear: External ear normal.  Left Ear: External ear normal.  Bilat tm's with mild erythema.  Max sinus areas non tender.  Pharynx with mild erythema, no exudate Eyes: . Pupils are equal, round, and reactive to light. Conjunctivae and EOM are normal Neck: Normal range of motion. Neck supple.  Cardiovascular: Normal rate and regular rhythm.   Pulmonary/Chest: Effort normal and breath sounds decreased without rales or wheezing.  Abd:  Soft, NT, ND, + BS Neurological: Pt is alert. Not confused , motor grossly intact Skin: Skin is warm. No rash, no LE edema Psychiatric: Pt behavior is normal. No agitation.     Assessment & Plan:

## 2014-04-20 NOTE — Patient Instructions (Signed)
Your physician recommends that you continue on your current medications as directed. Please refer to the Current Medication list given to you today.  Your physician recommends that you schedule a follow-up appointment in: 3 months  

## 2014-04-20 NOTE — Assessment & Plan Note (Signed)
stable overall by history and exam, recent data reviewed with pt, and pt to continue medical treatment as before,  to f/u any worsening symptoms or concerns BP Readings from Last 3 Encounters:  04/20/14 110/78  01/31/14 118/78  01/09/14 132/74

## 2014-04-20 NOTE — Assessment & Plan Note (Signed)
C/w bronchitis, cant r/o pna, for levaquin course, cough med prn, declines cxr  to f/u any worsening symptoms or concerns

## 2014-04-20 NOTE — Assessment & Plan Note (Signed)
stable overall by history and exam, recent data reviewed with pt, and pt to continue medical treatment as before,  to f/u any worsening symptoms or concerns Lab Results  Component Value Date   HGBA1C 6.6* 10/27/2013   For f/u labs

## 2014-04-20 NOTE — Progress Notes (Signed)
Pre visit review using our clinic review tool, if applicable. No additional management support is needed unless otherwise documented below in the visit note. 

## 2014-04-20 NOTE — Assessment & Plan Note (Signed)
miild recurrent small volume, on  Coumadin with elev INR recent and has been adjusted, no evidence orthostasis, for f/u cbc, also f/u coumadin clinic as planned, etiology most likely recurrent small diverticular bleeding, if becomes persistent and assoc with worsening symptoms and/or anemia, may need to re-consider coumadin use

## 2014-04-20 NOTE — Assessment & Plan Note (Signed)
The patient's volume status is stable. No change in his medications. I reviewed all of his other cardiac problems in my note of September, 2015. There is no significant change today.

## 2014-04-20 NOTE — Progress Notes (Signed)
Cardiology Office Note   Date:  04/20/2014   ID:  Eric Rivers, Eric Rivers [birth year], MRN [id number]  PCP:  Cathlean Cower, MD  Cardiologist:  Dola Argyle, MD   Chief Complaint  Patient presents with  . Appointment    Follow-up posthospitalization for a GI bleed.      History of Present Illness: Eric Rivers is a 81 y.o. male who presents today to follow-up his history of CHF. His volume status is been stable. He has atrial fib and he is on Coumadin. He had a significant diverticular bleed for which he was hospitalized. Colonoscopy revealed diverticulosis. It is presumed that his bleeding is from his diverticular disease. Today he tells me that he has had at least 3 recurrent episodes of some small amounts of bleeding. I am concerned about recurrent bleeding. I feel that Coumadin is still the best choice for him.    Past Medical History  Diagnosis Date  . Whooping cough   . Atrial fibrillation      Chronic,   24 hour holter, September, 2011.... atrial fib rate is controlled.... there is some bradycardia but  no marked pauses  . Diabetes mellitus     type 2  . Hypertension   . DDD (degenerative disc disease)     lumbar spine  . GERD (gastroesophageal reflux disease)   . Gout   . Fluid overload   . ACE-inhibitor cough   . Shortness of breath     on exertion  . Aortic stenosis     mild....echo... september... 2010/ mild... echo.[name] KitchenMarland KitchenDec, 2011  . Pulmonary hypertension     35mmHg, echo, 01/2010  . Hyperlipidemia   . Hx of colonoscopy     approx. 10 years  . Warfarin anticoagulation     Atrial fib  . Ejection fraction     EF 60%, echo, 01/2010  . Normal nuclear stress test     06/2005 , also ABI normal 2007  . Hypercholesterolemia   . Fracture of metacarpal bone 11/30/11  . Aortic dissection     Surgical repair February, 2014  . Mitral regurgitation   . Diabetes mellitus with neuropathy 12/04/2006    Qualifier: Diagnosis of  By: Linda Hedges MD, Heinz Knuckles  medications -  DPP4   . Bladder cancer     recurrence with TUR-B March '09  . Prostate cancer     radiation tx.  . Diverticulosis of colon with hemorrhage 01/01/2014    Past Surgical History  Procedure Laterality Date  . Lumbar laminectomy      '02  . Tur-bt '06, '09    . Bladder cancer biopsy  10/16/2010    negative for malignancy  . Bladder microscopic  04/13/2007    high grade papillary urothelial lesions  . 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 Prostate biopsy  09/25/2011    GLEASON 3+3=6 AND 4+3=7  . Ascending aortic root replacement N/A 04/15/2012    Procedure: ASCENDING AORTIC ROOT REPLACEMENT;  Surgeon: Gaye Pollack, MD;  Location: [facility];  Service: Open Heart Surgery;  Laterality: N/A;  . Tee without cardioversion N/A 04/15/2012    Procedure: TRANSESOPHAGEAL ECHOCARDIOGRAM (TEE);  Surgeon: Gaye Pollack, MD;  Location: [facility];  Service: Open Heart Surgery;  Laterality: N/A;  . Exploration post operative open heart  04/2012  . Cystoscopy/retrograde/ureteroscopy Bilateral 04/04/2013    Procedure: CYSTOSCOPY WITH RETROGRADE PYELOGRAM AND BLADDER BIOPSY;  Surgeon: Molli Hazard, MD;  Location: [facility];  Service: Urology;  Laterality: Bilateral;  . Herniatic disc surgery    . Colonoscopy N/A 01/01/2014    Procedure: COLONOSCOPY;  Surgeon: Gatha Mayer, MD;  Location: [facility];  Service: Endoscopy;  Laterality: N/A;    Patient Active Problem List   Diagnosis Date Noted  . Warfarin anticoagulation     Priority: High  . Cough 04/20/2014  . Bradycardia 01/06/2014  . Diverticulosis of colon with hemorrhage 01/01/2014  . Perianal dermatitis 01/01/2014  . Chronic anemia 12/29/2013  . Lower GI bleed 12/29/2013  . Warfarin-induced coagulopathy 12/29/2013  . Acute blood loss anemia 12/29/2013  . Chronic diastolic CHF (congestive heart failure) 11/02/2013  . Syncope 07/19/2013  . Advanced care planning/counseling discussion 04/12/2013  . Encounter  for therapeutic drug monitoring 04/12/2013  . Insomnia 04/11/2013  . Mitral regurgitation   . Ascending aortic dissection 04/16/2012  . Malignant neoplasm of prostate 10/27/2011  . Hypertension   . GERD (gastroesophageal reflux disease)   . Aortic stenosis   . Pulmonary hypertension   . Hyperlipidemia   . Ejection fraction   . Normal nuclear stress test   . Routine general medical examination at a health care facility 08/22/2010  . Bladder cancer   . Gout   . Chronic atrial fibrillation   . HYPERTROPHY PROSTATE W/O UR OBST & OTH LUTS 09/07/2007  . DEGENERATIVE JOINT DISEASE, GENERALIZED 09/07/2007  . NEOP, MALIGNANT, BLADDER NEC 12/04/2006  . Diabetes mellitus with neuropathy 12/04/2006      Current Outpatient Prescriptions  Medication Sig Dispense Refill  . acetaminophen (TYLENOL) 500 MG tablet Take 1,000 mg by mouth 3 (three) times daily as needed for mild pain, moderate pain, fever or headache.     . allopurinol (ZYLOPRIM) 300 MG tablet Take 1 tablet (300 mg total) by mouth daily. 90 tablet 1  . amLODipine (NORVASC) 2.5 MG tablet Take 1 tablet (2.5 mg total) by mouth daily. 90 tablet 3  . aspirin 325 MG tablet Take 325 mg by mouth daily.    [name] Kitchen atorvastatin (LIPITOR) 20 MG tablet Take 1 tablet (20 mg total) by mouth every morning. 90 tablet 1  . Calcium Carb-Cholecalciferol (CALCIUM 600 + D PO) Take 1 tablet by mouth every morning.    [name] Kitchen desonide (DESOWEN) 0.05 % lotion Apply 1 application topically daily as needed (for face).    . finasteride (PROSCAR) 5 MG tablet Take 1 tablet (5 mg total) by mouth every morning. 90 tablet 3  . furosemide (LASIX) 40 MG tablet Take 40 mg by mouth daily.    [name] Kitchen JANUVIA 100 MG tablet take 1 tablet by mouth once daily 90 tablet 3  . ketoconazole (NIZORAL) 2 % shampoo Apply 1 application topically 4 (four) times a week.    . Multiple Vitamins-Minerals (ONCOVITE PO) Take 1 tablet by mouth daily.     . pantoprazole (PROTONIX) 40 MG tablet Take 1 tablet  (40 mg total) by mouth daily. 90 tablet 1  . potassium chloride (K-DUR,KLOR-CON) 10 MEQ tablet Take 1 tablet (10 mEq total) by mouth daily. 90 tablet 2  . Tamsulosin HCl (FLOMAX) 0.4 MG CAPS Take 0.4 mg by mouth at bedtime.     . traZODone (DESYREL) 50 MG tablet take 1/2 to 1 tablet by mouth at bedtime if needed for sleep 90 tablet 1  . warfarin (COUMADIN) 4 MG tablet Take as directed by anticoagulation clinic 105 tablet 3   No current facility-administered medications for this visit.    Allergies:  Metoprolol    Social History:  The patient  reports that he quit smoking about 39 years ago. His smoking use included Cigarettes. He smoked 1.00 pack per day. He has never used smokeless tobacco. He reports that he drinks about 7.0 oz of alcohol per week. He reports that he does not use illicit drugs.   Family History:  The patient's family history includes Cancer in his father; Heart disease in his mother; Hypertension in his mother; Prostate cancer (age of onset: 30) in his father; Stroke in his father.    ROS:  Please see the history of present illness.   Patient denies fever, chills, headache, sweats, rash, change in vision, change in hearing, chest pain, cough, nausea or vomiting, urinary symptoms. All other systems are reviewed and are negative.    PHYSICAL EXAM: VS:  BP 124/60 mmHg  Pulse 88  Ht 5\' 8"  (1.727 m)  Wt 227 lb 9.6 oz (103.239 kg)  BMI 34.61 kg/m2 , Patient is oriented to person time and place. Affect is normal. He is overweight. Head is atraumatic. Sclerae and conjunctivae are normal. There is no jugulovenous distention. Lungs are clear. Respiratory effort is nonlabored. Cardiac exam reveals S1 and S2. The abdomen is soft. There is no peripheral edema. There are no musculoskeletal deformities. There are no skin rashes.  EKG:   EKG is not done today. Recent Labs: 07/19/2013: Pro B Natriuretic peptide (BNP) 1583.0* 10/26/2013: TSH 1.69 04/20/2014: ALT 11; BUN 16; Creatinine  1.02; Hemoglobin 9.9*; Platelets 260.0; Potassium 4.1; Sodium 138    Lipid Panel    Component Value Date/Time   CHOL 81 04/20/2014 1116   TRIG 65.0 04/20/2014 1116   HDL 34.80* 04/20/2014 1116   CHOLHDL 2 04/20/2014 1116   VLDL 13.0 04/20/2014 1116   LDLCALC 33 04/20/2014 1116      Wt Readings from Last 3 Encounters:  04/20/14 227 lb 9.6 oz (103.239 kg)  04/20/14 227 lb (102.967 kg)  01/31/14 225 lb 8 oz (102.286 kg)      Current medicines are reviewed  The patient understands his medications.     ASSESSMENT AND PLAN:

## 2014-04-20 NOTE — Patient Instructions (Signed)
Please take all new medication as prescribed - the antibiotic, and cough medicine if needed  Please continue all other medications as before, and refills have been done if requested.  Please have the pharmacy call with any other refills you may need.  Please continue your efforts at being more active, low cholesterol diet, and weight control.  Please keep your appointments with your specialists as you may have planned  Please go to the LAB in the Basement (turn left off the elevator) for the tests to be done today  You will be contacted by phone if any changes need to be made immediately.  Otherwise, you will receive a letter about your results with an explanation, but please check with MyChart first.  Please remember to sign up for MyChart if you have not done so, as this will be important to you in the future with finding out test results, communicating by private email, and scheduling acute appointments online when needed.  Please return in 6 months, or sooner if needed

## 2014-04-20 NOTE — Assessment & Plan Note (Signed)
The patient is had recurring diverticular bleeding. I feel that we must adjust his Coumadin dosing. Previously his range had been 2.0-3.0. I want to move it to a range of 1.9-2.5. I will continue his Coumadin. The patient takes many cruises. Any situation will be dangerous for him. However Coumadin will be safer away from [place] then a new origin.

## 2014-04-25 ENCOUNTER — Encounter: Payer: Self-pay | Admitting: Internal Medicine

## 2014-04-25 ENCOUNTER — Ambulatory Visit (INDEPENDENT_AMBULATORY_CARE_PROVIDER_SITE_OTHER): Payer: Medicare Other | Admitting: Internal Medicine

## 2014-04-25 VITALS — BP 134/70 | HR 90 | Temp 97.8°F | Resp 14 | Ht 68.0 in | Wt 224.8 lb

## 2014-04-25 DIAGNOSIS — S99922S Unspecified injury of left foot, sequela: Secondary | ICD-10-CM | POA: Diagnosis not present

## 2014-04-25 DIAGNOSIS — Z7901 Long term (current) use of anticoagulants: Secondary | ICD-10-CM

## 2014-04-25 NOTE — Progress Notes (Signed)
   Subjective:    Patient ID: Eric Rivers, male    DOB: 20-Mar-1930, 81 y.o.   MRN: [id number]  HPI  Today in the shower he caught his third left toe nail on the drain cover. He felt no pain but there was bleeding in the shower water. The bleeding could not be stopped by neighbors; EMS did apply a pressure type dressing on it which finally stopped the bleeding.  He is on warfarin; his last PT/INR was 2.9 on 2/18. His warfarin dose was decreased to 6 mg on Monday; he was to continue 4 mg other days.  Review of Systems He did have epistaxis last week related to airway drying. This has resolved. He has no other bleeding dyscrasias.  Hemoptysis, hematuria, melena, or rectal bleeding denied. No unexplained weight loss, significant dyspepsia,dysphagia, or abdominal pain.      Objective:   Physical Exam   Pertinent or positive findings include: He has an irregular rhythm which is slow. Abdomen is protuberant. The right pedal pulses are decreased more than the left. He has 1/2+-1 + edema on the right  and 1/2+ on the left. The left pedal pulses are decreased but not to the degree as on the right. He has stasis hyperpigmentation changes of the lower legs & feet Fungal toenail changes are noted in the left great nail. There is avulsion of the lateral aspect of the third left toenail. There is no active bleeding from the wound.  General appearance :adequately nourished; in no distress. Eyes: No conjunctival inflammation or scleral icterus is present. Heart:  No gallop, murmur, click, rub or other extra sounds   Lungs:Chest clear to auscultation; no wheezes, rhonchi,rales ,or rubs present.No increased work of breathing.  Abdomen: bowel sounds normal, soft and non-tender without masses, organomegaly or hernias noted.  No guarding or rebound.  Skin:Warm & dry.  Intact without suspicious lesions or rashes ; no jaundice or tenting Lymphatic: No lymphadenopathy is noted about the head, neck,  axilla         Assessment & Plan:  #1Toenail avulsion  #2 warfarin therapy  Plan: See after visit summary

## 2014-04-25 NOTE — Patient Instructions (Addendum)
Please report warning signs as we discussed. Worrisome would be red streaks up the extremity, increased pain, fever, or pus production.Do not apply antibiotic ointments or hydogen peroxide to the nail wound. Please see your Podiatrist by Friday 04/28/14

## 2014-04-25 NOTE — Progress Notes (Signed)
Pre visit review using our clinic review tool, if applicable. No additional management support is needed unless otherwise documented below in the visit note. 

## 2014-04-27 DIAGNOSIS — M79672 Pain in left foot: Secondary | ICD-10-CM | POA: Diagnosis not present

## 2014-04-27 DIAGNOSIS — M25572 Pain in left ankle and joints of left foot: Secondary | ICD-10-CM | POA: Diagnosis not present

## 2014-05-04 ENCOUNTER — Ambulatory Visit (INDEPENDENT_AMBULATORY_CARE_PROVIDER_SITE_OTHER): Payer: Medicare Other | Admitting: General Practice

## 2014-05-04 DIAGNOSIS — I270 Primary pulmonary hypertension: Secondary | ICD-10-CM | POA: Diagnosis not present

## 2014-05-04 DIAGNOSIS — Z5181 Encounter for therapeutic drug level monitoring: Secondary | ICD-10-CM

## 2014-05-04 DIAGNOSIS — Z7901 Long term (current) use of anticoagulants: Secondary | ICD-10-CM

## 2014-05-04 DIAGNOSIS — I482 Chronic atrial fibrillation, unspecified: Secondary | ICD-10-CM

## 2014-05-04 DIAGNOSIS — I272 Pulmonary hypertension, unspecified: Secondary | ICD-10-CM

## 2014-05-04 LAB — POCT INR: INR: 2.9

## 2014-05-04 NOTE — Progress Notes (Signed)
Pre visit review using our clinic review tool, if applicable. No additional management support is needed unless otherwise documented below in the visit note. 

## 2014-05-11 DIAGNOSIS — L603 Nail dystrophy: Secondary | ICD-10-CM | POA: Diagnosis not present

## 2014-05-11 DIAGNOSIS — L84 Corns and callosities: Secondary | ICD-10-CM | POA: Diagnosis not present

## 2014-05-11 DIAGNOSIS — E1151 Type 2 diabetes mellitus with diabetic peripheral angiopathy without gangrene: Secondary | ICD-10-CM | POA: Diagnosis not present

## 2014-05-11 DIAGNOSIS — I739 Peripheral vascular disease, unspecified: Secondary | ICD-10-CM | POA: Diagnosis not present

## 2014-07-08 ENCOUNTER — Other Ambulatory Visit: Payer: Self-pay | Admitting: Internal Medicine

## 2014-07-11 ENCOUNTER — Other Ambulatory Visit: Payer: Self-pay

## 2014-07-11 MED ORDER — POTASSIUM CHLORIDE CRYS ER 10 MEQ PO TBCR: 10.0000 meq | EXTENDED_RELEASE_TABLET | Freq: Every day | ORAL | Status: DC

## 2014-07-13 ENCOUNTER — Ambulatory Visit (INDEPENDENT_AMBULATORY_CARE_PROVIDER_SITE_OTHER): Payer: Medicare Other | Admitting: General Practice

## 2014-07-13 DIAGNOSIS — I482 Chronic atrial fibrillation, unspecified: Secondary | ICD-10-CM

## 2014-07-13 DIAGNOSIS — Z5181 Encounter for therapeutic drug level monitoring: Secondary | ICD-10-CM

## 2014-07-13 DIAGNOSIS — I270 Primary pulmonary hypertension: Secondary | ICD-10-CM | POA: Diagnosis not present

## 2014-07-13 DIAGNOSIS — I272 Pulmonary hypertension, unspecified: Secondary | ICD-10-CM

## 2014-07-13 LAB — POCT INR: INR: 2.7

## 2014-07-13 IMAGING — CR DG CHEST 2V
2 series · 2 of 2 positions shown · non-contrast
Comparison: 10/15/2010

CLINICAL DATA: Atrial fibrillation, prior smoker, hypertension,
prostate cancer

CHEST - 2 VIEW

[w chest pa]
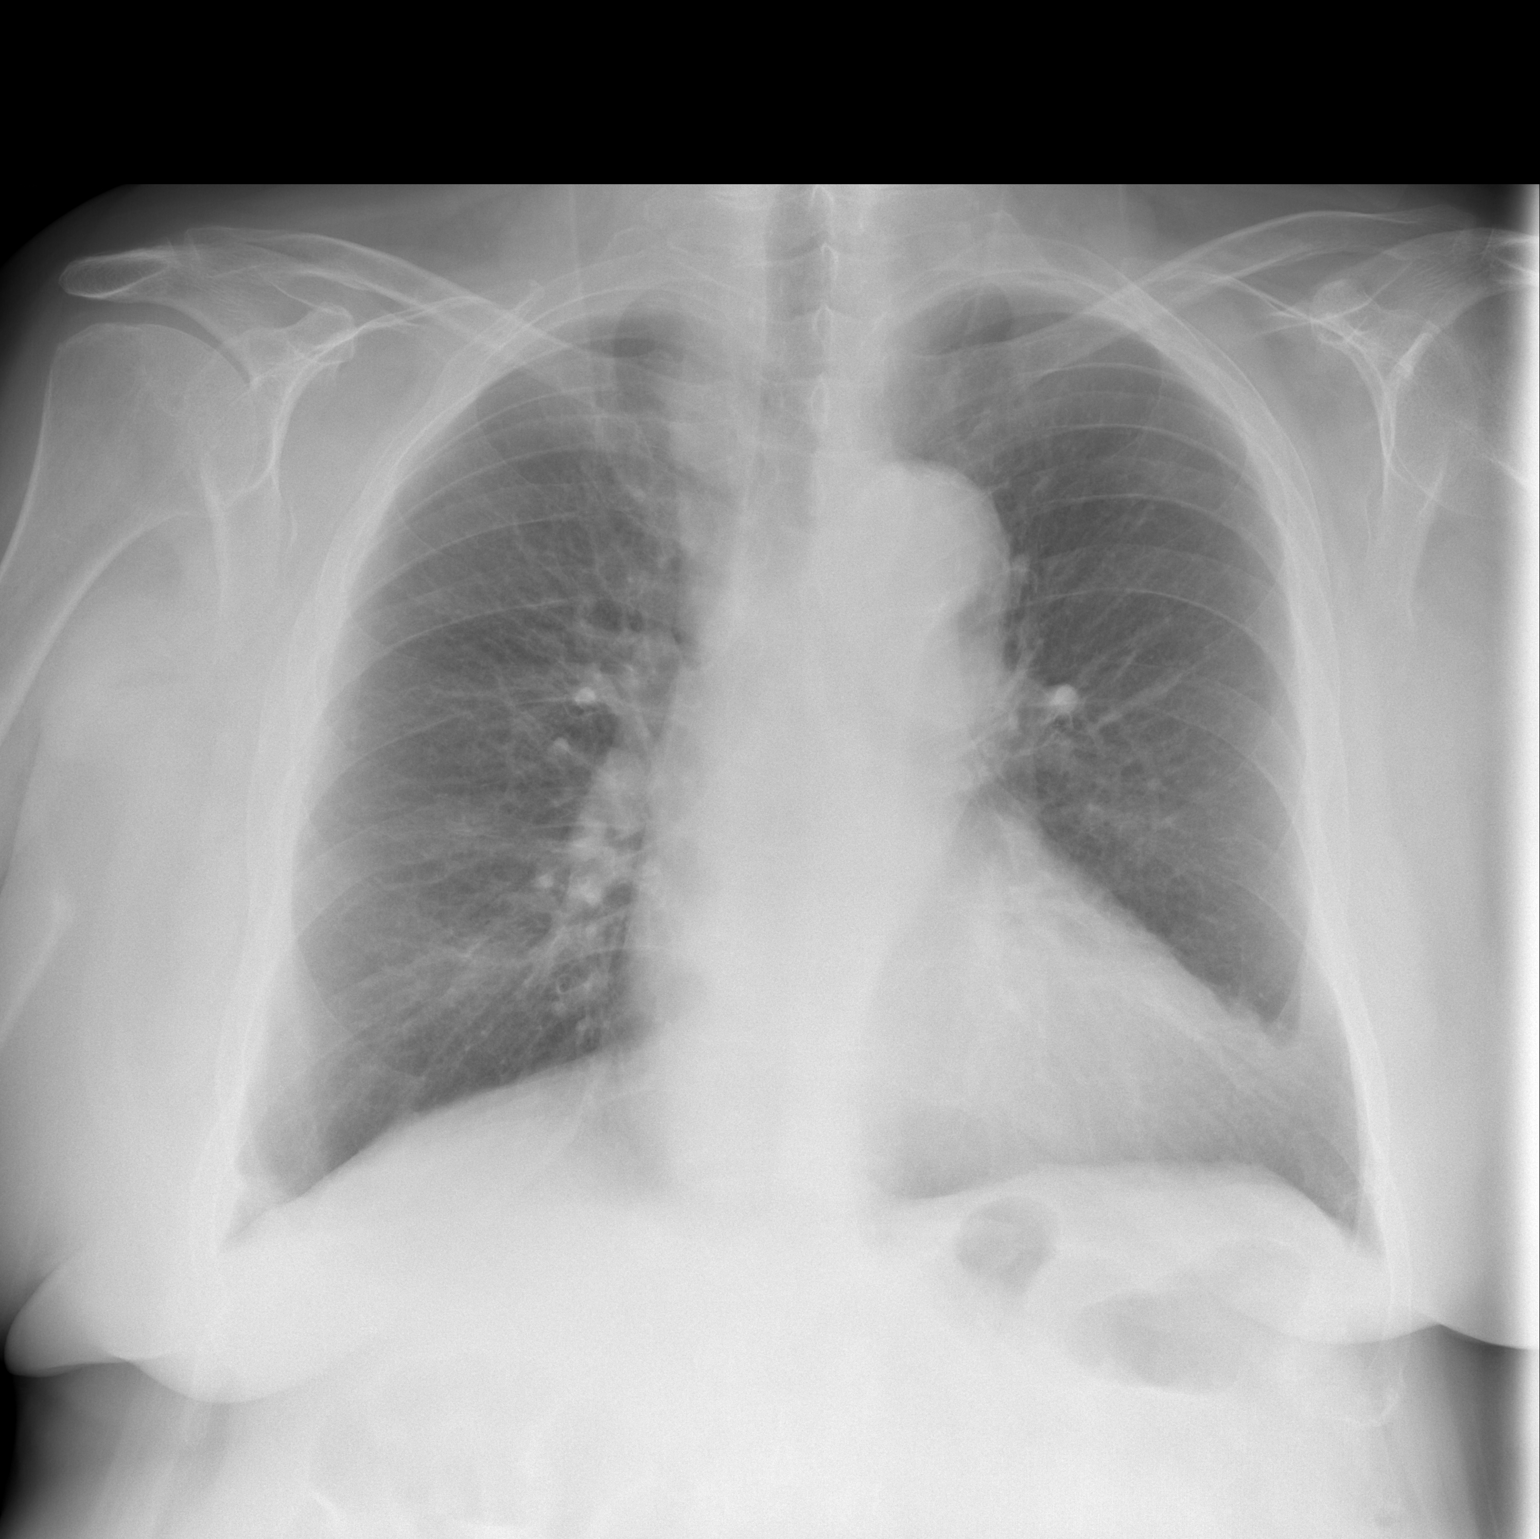

[w chest lat]
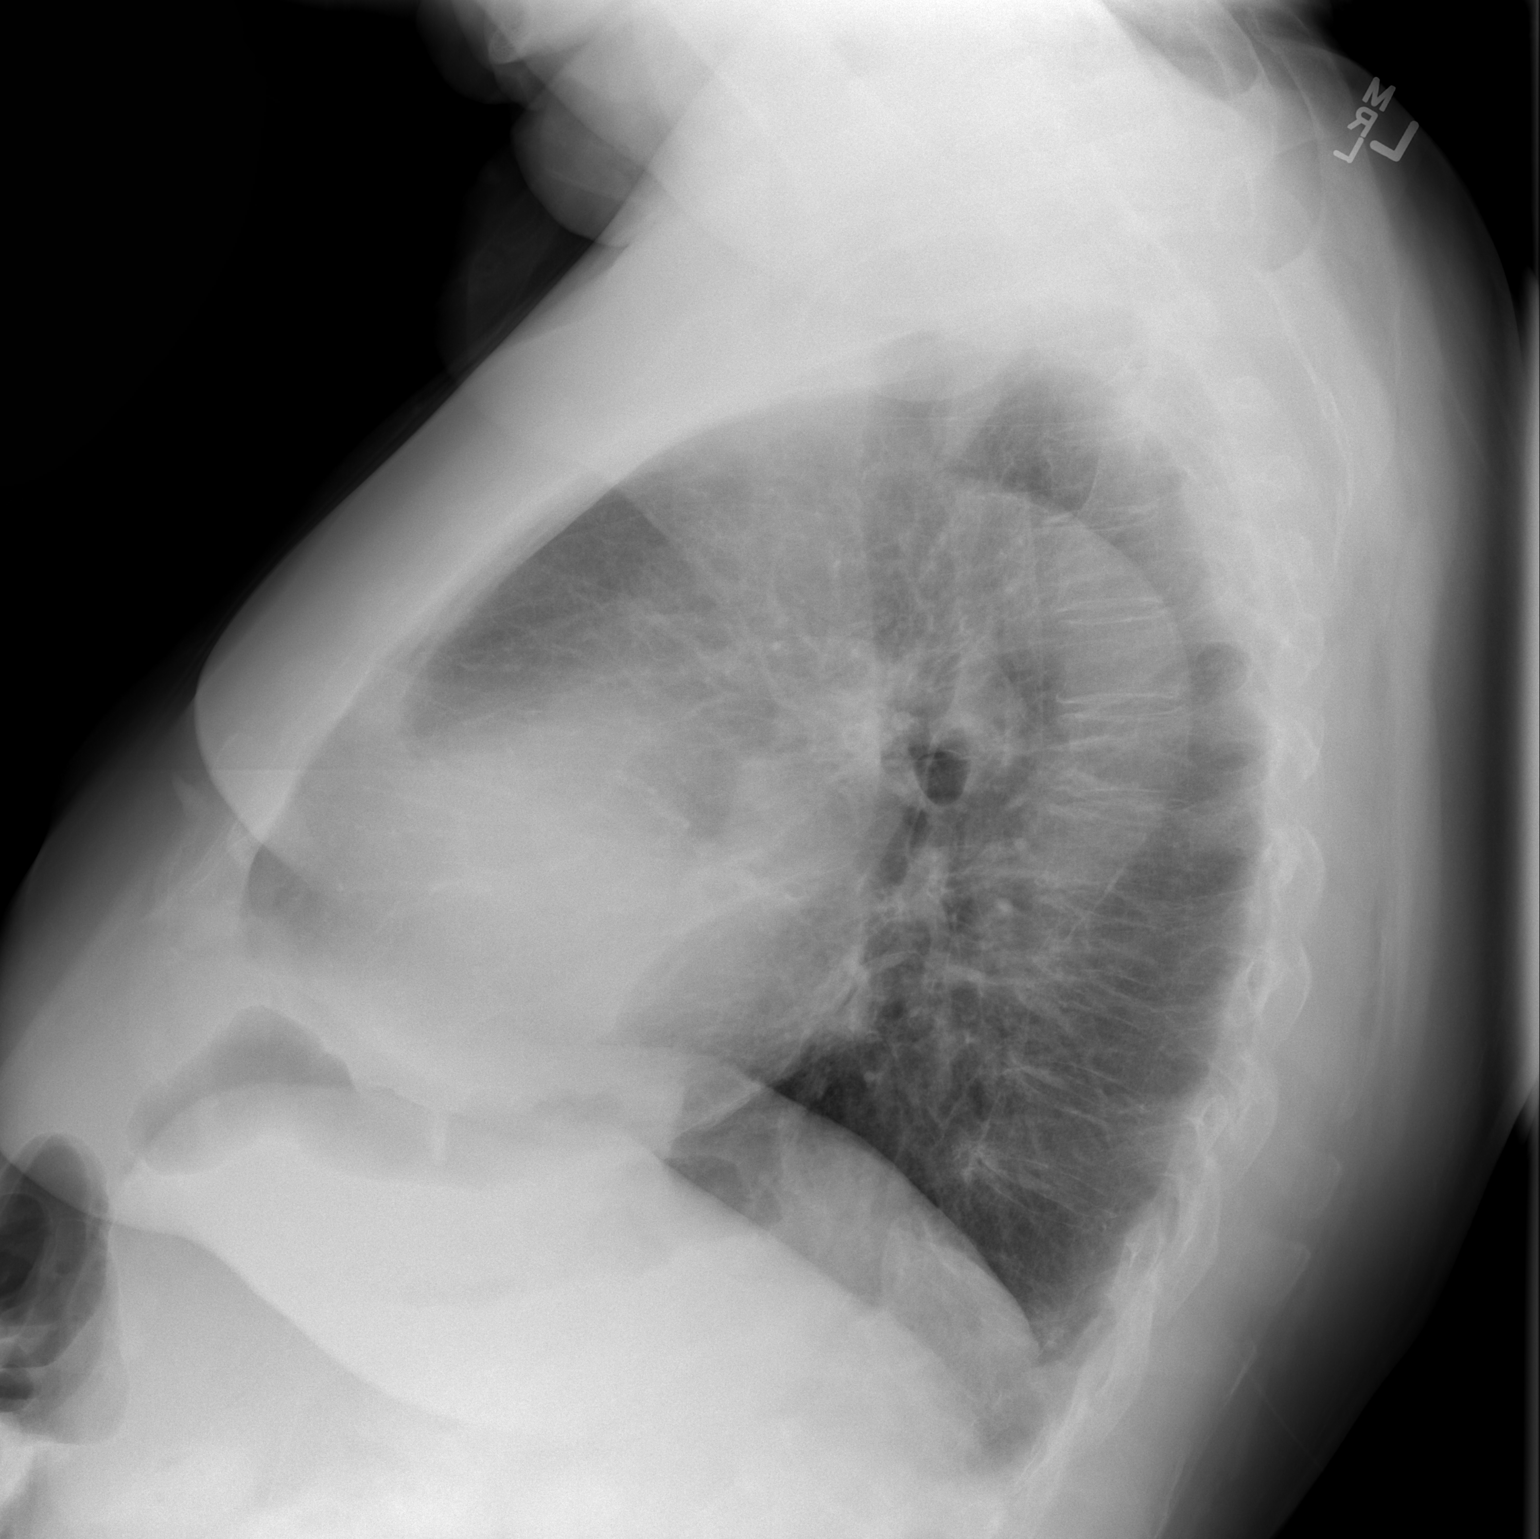

[2 of 2 positions shown; findings below may reference images not displayed]

FINDINGS: Hyperinflation noted compatible with background
COPD/emphysema.  Right base scarring noted at the costophrenic
angle.  Mild cardiac enlargement but normal vascularity.  Negative
for CHF or pneumonia.  No pneumothorax.  Trachea midline.
Atherosclerotic changes of the aorta.  Degenerative changes of the
spine.
IMPRESSION: Stable chest exam.  No acute process

## 2014-07-13 NOTE — Progress Notes (Signed)
Pre visit review using our clinic review tool, if applicable. No additional management support is needed unless otherwise documented below in the visit note. 

## 2014-07-14 ENCOUNTER — Other Ambulatory Visit: Payer: Self-pay

## 2014-07-14 MED ORDER — POTASSIUM CHLORIDE CRYS ER 10 MEQ PO TBCR: 10.0000 meq | EXTENDED_RELEASE_TABLET | Freq: Every day | ORAL | Status: DC

## 2014-07-16 ENCOUNTER — Other Ambulatory Visit: Payer: Self-pay | Admitting: Internal Medicine

## 2014-07-17 ENCOUNTER — Other Ambulatory Visit: Payer: Self-pay

## 2014-07-17 MED ORDER — AMLODIPINE BESYLATE 2.5 MG PO TABS
2.5000 mg | ORAL_TABLET | Freq: Every day | ORAL | Status: DC
Start: 2014-07-17 — End: 2015-07-11

## 2014-07-17 NOTE — Telephone Encounter (Signed)
Carlena Bjornstad, MD at 04/20/2014 3:33 PM  amLODipine (NORVASC) 2.5 MG tablet Take 1 tablet (2.5 mg total) by mouth daily Patient Instructions     Your physician recommends that you continue on your current medications as directed. Please refer to the Current Medication list given to you today.

## 2014-07-20 DIAGNOSIS — L603 Nail dystrophy: Secondary | ICD-10-CM | POA: Diagnosis not present

## 2014-07-20 DIAGNOSIS — I739 Peripheral vascular disease, unspecified: Secondary | ICD-10-CM | POA: Diagnosis not present

## 2014-07-20 DIAGNOSIS — E1151 Type 2 diabetes mellitus with diabetic peripheral angiopathy without gangrene: Secondary | ICD-10-CM | POA: Diagnosis not present

## 2014-07-21 ENCOUNTER — Encounter: Payer: Self-pay | Admitting: Cardiology

## 2014-07-21 ENCOUNTER — Ambulatory Visit (INDEPENDENT_AMBULATORY_CARE_PROVIDER_SITE_OTHER): Payer: Medicare Other | Admitting: Cardiology

## 2014-07-21 VITALS — BP 136/72 | HR 71 | Ht 68.0 in | Wt 224.0 lb

## 2014-07-21 DIAGNOSIS — I482 Chronic atrial fibrillation, unspecified: Secondary | ICD-10-CM

## 2014-07-21 DIAGNOSIS — I5032 Chronic diastolic (congestive) heart failure: Secondary | ICD-10-CM

## 2014-07-21 NOTE — Progress Notes (Signed)
Cardiology Office Note   Date:  07/21/2014   ID:  Eric Rivers, Eric Rivers [birth year], MRN [id number]  PCP:  Cathlean Cower, MD  Cardiologist:  Dola Argyle, MD   Chief Complaint  Patient presents with  . Appointment    Follow-up atrial fibrillation      History of Present Illness: Eric Rivers is a 81 y.o. male who presents today to follow up CHF. He's been relatively stable. He has had some exertional shortness of breath. Some days he does not take his Lasix in the morning before he is ready to go out and about. He then returns and doesn't take it later in the day. Therefore he is not actually receiving his medication daily. We discussed this and he will corrected.    Past Medical History  Diagnosis Date  . Whooping cough   . Atrial fibrillation      Chronic,   24 hour holter, September, 2011.... atrial fib rate is controlled.... there is some bradycardia but  no marked pauses  . Diabetes mellitus     type 2  . Hypertension   . DDD (degenerative disc disease)     lumbar spine  . GERD (gastroesophageal reflux disease)   . Gout   . Fluid overload   . ACE-inhibitor cough   . Shortness of breath     on exertion  . Aortic stenosis     mild....echo... september... 2010/ mild... echo.[name] KitchenMarland KitchenDec, 2011  . Pulmonary hypertension     43mmHg, echo, 01/2010  . Hyperlipidemia   . Hx of colonoscopy     approx. 10 years  . Warfarin anticoagulation     Atrial fib  . Ejection fraction     EF 60%, echo, 01/2010  . Normal nuclear stress test     06/2005 , also ABI normal 2007  . Hypercholesterolemia   . Fracture of metacarpal bone 11/30/11  . Aortic dissection     Surgical repair February, 2014  . Mitral regurgitation   . Diabetes mellitus with neuropathy 12/04/2006    Qualifier: Diagnosis of  By: Linda Hedges MD, Heinz Knuckles  medications - DPP4   . Bladder cancer     recurrence with TUR-B March '09  . Prostate cancer     radiation tx.  . Diverticulosis of colon with hemorrhage 01/01/2014     Past Surgical History  Procedure Laterality Date  . Lumbar laminectomy      '02  . Tur-bt '06, '09    . Bladder cancer biopsy  10/16/2010    negative for malignancy  . Bladder microscopic  04/13/2007    high grade papillary urothelial lesions  . 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 Prostate biopsy  09/25/2011    GLEASON 3+3=6 AND 4+3=7  . Ascending aortic root replacement N/A 04/15/2012    Procedure: ASCENDING AORTIC ROOT REPLACEMENT;  Surgeon: Gaye Pollack, MD;  Location: [facility];  Service: Open Heart Surgery;  Laterality: N/A;  . Tee without cardioversion N/A 04/15/2012    Procedure: TRANSESOPHAGEAL ECHOCARDIOGRAM (TEE);  Surgeon: Gaye Pollack, MD;  Location: [facility];  Service: Open Heart Surgery;  Laterality: N/A;  . Exploration post operative open heart  04/2012  . Cystoscopy/retrograde/ureteroscopy Bilateral 04/04/2013    Procedure: CYSTOSCOPY WITH RETROGRADE PYELOGRAM AND BLADDER BIOPSY;  Surgeon: Molli Hazard, MD;  Location: [facility];  Service: Urology;  Laterality: Bilateral;  . Herniatic disc surgery    . Colonoscopy N/A 01/01/2014  Procedure: COLONOSCOPY;  Surgeon: Gatha Mayer, MD;  Location: [facility];  Service: Endoscopy;  Laterality: N/A;    Patient Active Problem List   Diagnosis Date Noted  . Warfarin anticoagulation     Priority: High  . Cough 04/20/2014  . Bradycardia 01/06/2014  . Diverticulosis of colon with hemorrhage 01/01/2014  . Perianal dermatitis 01/01/2014  . Chronic anemia 12/29/2013  . Lower GI bleed 12/29/2013  . Warfarin-induced coagulopathy 12/29/2013  . Acute blood loss anemia 12/29/2013  . Chronic diastolic CHF (congestive heart failure) 11/02/2013  . Syncope 07/19/2013  . Advanced care planning/counseling discussion 04/12/2013  . Encounter for therapeutic drug monitoring 04/12/2013  . Insomnia 04/11/2013  . Mitral regurgitation   . Ascending aortic dissection 04/16/2012  . Malignant  neoplasm of prostate 10/27/2011  . Hypertension   . GERD (gastroesophageal reflux disease)   . Aortic stenosis   . Pulmonary hypertension   . Hyperlipidemia   . Ejection fraction   . Normal nuclear stress test   . Routine general medical examination at a health care facility 08/22/2010  . Bladder cancer   . Gout   . Chronic atrial fibrillation   . HYPERTROPHY PROSTATE W/O UR OBST & OTH LUTS 09/07/2007  . DEGENERATIVE JOINT DISEASE, GENERALIZED 09/07/2007  . NEOP, MALIGNANT, BLADDER NEC 12/04/2006  . Diabetes mellitus with neuropathy 12/04/2006      Current Outpatient Prescriptions  Medication Sig Dispense Refill  . acetaminophen (TYLENOL) 500 MG tablet Take 1,000 mg by mouth 3 (three) times daily as needed for mild pain, moderate pain, fever or headache.     . allopurinol (ZYLOPRIM) 300 MG tablet take 1 tablet by mouth once daily 90 tablet 1  . amLODipine (NORVASC) 2.5 MG tablet Take 1 tablet (2.5 mg total) by mouth daily. 90 tablet 3  . aspirin 325 MG tablet Take 325 mg by mouth daily.    [name] Kitchen atorvastatin (LIPITOR) 20 MG tablet take 1 tablet by mouth every morning 90 tablet 3  . Calcium Carb-Cholecalciferol (CALCIUM 600 + D PO) Take 1 tablet by mouth every morning.    [name] Kitchen desonide (DESOWEN) 0.05 % lotion Apply 1 application topically daily as needed (for face).    . finasteride (PROSCAR) 5 MG tablet Take 1 tablet (5 mg total) by mouth every morning. 90 tablet 3  . furosemide (LASIX) 40 MG tablet Take 40 mg by mouth daily.    [name] Kitchen JANUVIA 100 MG tablet take 1 tablet by mouth once daily 90 tablet 3  . ketoconazole (NIZORAL) 2 % shampoo Apply 1 application topically 4 (four) times a week.    . Multiple Vitamins-Minerals (ONCOVITE PO) Take 1 tablet by mouth daily.     . pantoprazole (PROTONIX) 40 MG tablet take 1 tablet by mouth once daily 90 tablet 3  . potassium chloride (K-DUR,KLOR-CON) 10 MEQ tablet Take 1 tablet (10 mEq total) by mouth daily. 90 tablet 1  . Tamsulosin HCl (FLOMAX) 0.4  MG CAPS Take 0.4 mg by mouth at bedtime.     . traZODone (DESYREL) 50 MG tablet take 1/2 to 1 tablet by mouth at bedtime if needed for sleep 90 tablet 1  . warfarin (COUMADIN) 4 MG tablet Take as directed by anticoagulation clinic 105 tablet 3   No current facility-administered medications for this visit.    Allergies:   Metoprolol    Social History:  The patient  reports that he quit smoking about 39 years ago. His smoking use included Cigarettes. He smoked 1.00 pack per day.  He has never used smokeless tobacco. He reports that he drinks about 7.0 oz of alcohol per week. He reports that he does not use illicit drugs.   Family History:  The patient's family history includes Cancer in his father; Heart disease in his mother; Hypertension in his mother; Prostate cancer (age of onset: 23) in his father; Stroke in his father.    ROS:  Please see the history of present illness.     Patient denies fever, chills, headache, sweats, rash, change in vision, change in hearing, chest pain, cough, nausea or vomiting, urinary symptoms. All of the systems are reviewed and are negative.   PHYSICAL EXAM: VS:  BP 136/72 mmHg  Pulse 71  Ht 5\' 8"  (1.727 m)  Wt 224 lb (101.606 kg)  BMI 34.07 kg/m2  SpO2 95% , patient is oriented to person time and place. Affect is normal. Head is atraumatic. Sclera and conjunctiva are normal. He is overweight. There is no jugular venous distention. Lungs are clear. Respiratory effort is nonlabored. Cardiac exam reveals S1 and S2. Abdomen is soft. There is no significant peripheral edema today. There are no musculoskeletal deformities. There are no skin rashes.  EKG:   EKG is not done today.   Recent Labs: 10/26/2013: TSH 1.69 04/20/2014: ALT 11; BUN 16; Creatinine 1.02; Hemoglobin 9.9*; Platelets 260.0; Potassium 4.1; Sodium 138    Lipid Panel    Component Value Date/Time   CHOL 81 04/20/2014 1116   TRIG 65.0 04/20/2014 1116   HDL 34.80* 04/20/2014 1116   CHOLHDL 2  04/20/2014 1116   VLDL 13.0 04/20/2014 1116   LDLCALC 33 04/20/2014 1116      Wt Readings from Last 3 Encounters:  07/21/14 224 lb (101.606 kg)  04/25/14 224 lb 12 oz (101.946 kg)  04/20/14 227 lb 9.6 oz (103.239 kg)      Current medicines are reviewed  The patient understands his medications.     ASSESSMENT AND PLAN:

## 2014-07-21 NOTE — Assessment & Plan Note (Signed)
He has had some shortness of breath. There may be a volume component to this. We've corrected the fact that he fails to take his Lasix uncertain days that he goes out in the morning. He will now take it when he comes home. I think this be enough to stabilize his volume status completely.

## 2014-07-21 NOTE — Assessment & Plan Note (Signed)
Atrial fib rate is controlled. No change in therapy. He is anticoagulated.

## 2014-07-21 NOTE — Patient Instructions (Signed)
Medication Instructions:  Same  Labwork: None  Testing/Procedures: None  Follow-Up: Your physician recommends that you schedule a follow-up appointment in: 4 months

## 2014-08-10 ENCOUNTER — Ambulatory Visit (INDEPENDENT_AMBULATORY_CARE_PROVIDER_SITE_OTHER): Payer: Medicare Other | Admitting: General Practice

## 2014-08-10 DIAGNOSIS — Z5181 Encounter for therapeutic drug level monitoring: Secondary | ICD-10-CM

## 2014-08-10 DIAGNOSIS — I270 Primary pulmonary hypertension: Secondary | ICD-10-CM | POA: Diagnosis not present

## 2014-08-10 DIAGNOSIS — I482 Chronic atrial fibrillation, unspecified: Secondary | ICD-10-CM

## 2014-08-10 DIAGNOSIS — I272 Pulmonary hypertension, unspecified: Secondary | ICD-10-CM

## 2014-08-10 DIAGNOSIS — Z7901 Long term (current) use of anticoagulants: Secondary | ICD-10-CM | POA: Diagnosis not present

## 2014-08-10 LAB — POCT INR: INR: 1.7

## 2014-08-10 NOTE — Progress Notes (Signed)
Pre visit review using our clinic review tool, if applicable. No additional management support is needed unless otherwise documented below in the visit note. 

## 2014-08-22 DIAGNOSIS — D1800 Hemangioma unspecified site: Secondary | ICD-10-CM | POA: Diagnosis not present

## 2014-08-22 DIAGNOSIS — D225 Melanocytic nevi of trunk: Secondary | ICD-10-CM | POA: Diagnosis not present

## 2014-08-22 DIAGNOSIS — Z85820 Personal history of malignant melanoma of skin: Secondary | ICD-10-CM | POA: Diagnosis not present

## 2014-08-22 DIAGNOSIS — L304 Erythema intertrigo: Secondary | ICD-10-CM | POA: Diagnosis not present

## 2014-08-22 DIAGNOSIS — L814 Other melanin hyperpigmentation: Secondary | ICD-10-CM | POA: Diagnosis not present

## 2014-08-22 DIAGNOSIS — D2272 Melanocytic nevi of left lower limb, including hip: Secondary | ICD-10-CM | POA: Diagnosis not present

## 2014-08-22 DIAGNOSIS — C44719 Basal cell carcinoma of skin of left lower limb, including hip: Secondary | ICD-10-CM | POA: Diagnosis not present

## 2014-08-22 DIAGNOSIS — Z86018 Personal history of other benign neoplasm: Secondary | ICD-10-CM | POA: Diagnosis not present

## 2014-08-22 DIAGNOSIS — L821 Other seborrheic keratosis: Secondary | ICD-10-CM | POA: Diagnosis not present

## 2014-08-22 DIAGNOSIS — Z85828 Personal history of other malignant neoplasm of skin: Secondary | ICD-10-CM | POA: Diagnosis not present

## 2014-08-22 DIAGNOSIS — D485 Neoplasm of uncertain behavior of skin: Secondary | ICD-10-CM | POA: Diagnosis not present

## 2014-08-23 ENCOUNTER — Ambulatory Visit (INDEPENDENT_AMBULATORY_CARE_PROVIDER_SITE_OTHER): Payer: Medicare Other | Admitting: Physician Assistant

## 2014-08-23 VITALS — BP 132/80 | HR 89 | Temp 98.0°F | Resp 17 | Ht 67.0 in | Wt 220.0 lb

## 2014-08-23 DIAGNOSIS — T148XXA Other injury of unspecified body region, initial encounter: Secondary | ICD-10-CM

## 2014-08-23 DIAGNOSIS — T148 Other injury of unspecified body region: Secondary | ICD-10-CM | POA: Diagnosis not present

## 2014-08-23 NOTE — Progress Notes (Signed)
   Subjective:    Patient ID: Eric Rivers, male    DOB: [birth year], 81 y.o.   MRN: [id number]  HPI Patient presents for wound check status post suspicious mole removal 1 day ago on left leg at dermatologist office. Patient is on coumadin and has saturated through his pressure bandage. Denies dizziness or feeling lightheaded or weak. Wondering if he should discontinue his coumadin at this time. Last INR was within range and is checked regularly at Coumadin clinic.    Review of Systems  Constitutional: Negative for fever, chills, diaphoresis and fatigue.  Skin: Positive for wound. Negative for color change.  Neurological: Negative for dizziness, weakness and light-headedness.       Objective:   Physical Exam  Constitutional: He is oriented to person, place, and time. He appears well-developed and well-nourished. No distress.  Blood pressure 132/80, pulse 89, temperature 98 F (36.7 C), temperature source Oral, resp. rate 17, height 5\' 7"  (1.702 m), weight 220 lb (99.791 kg), SpO2 94 %.   HENT:  Head: Normocephalic and atraumatic.  Right Ear: External ear normal.  Left Ear: External ear normal.  Eyes: Conjunctivae are normal. Right eye exhibits no discharge. Left eye exhibits no discharge. No scleral icterus.  Pulmonary/Chest: Effort normal.  Neurological: He is alert and oriented to person, place, and time.  Skin: He is not diaphoretic.  Dressing intact and is saturated with blood. Both moist and dry blood present. Upon removal of bandage, clot is present over the wound, however, wound still draining blood.  Psychiatric: He has a normal mood and affect. His behavior is normal. Judgment and thought content normal.   Procedure Consent obtained. Around wound cleaned with water. Clot left in place. Pressure dressing applied. Care instructions discussed.     Assessment & Plan:  1. Open wound with complication Should leave dressing on for next 24-48 hours unless becomes saturated  then another pressure dressing should be applied. Should contact PCP/coumadin contact to see if ok to lower dose for next 24 hours.    Alveta Heimlich PA-C  [facility] 08/23/2014 8:51 AM

## 2014-08-30 ENCOUNTER — Other Ambulatory Visit: Payer: Self-pay | Admitting: Internal Medicine

## 2014-08-30 NOTE — Telephone Encounter (Signed)
Rx faxed to pharmacy  

## 2014-08-30 NOTE — Telephone Encounter (Signed)
Done erx 

## 2014-09-06 DIAGNOSIS — C44719 Basal cell carcinoma of skin of left lower limb, including hip: Secondary | ICD-10-CM | POA: Diagnosis not present

## 2014-09-07 ENCOUNTER — Ambulatory Visit (INDEPENDENT_AMBULATORY_CARE_PROVIDER_SITE_OTHER): Payer: Medicare Other | Admitting: General Practice

## 2014-09-07 DIAGNOSIS — Z5181 Encounter for therapeutic drug level monitoring: Secondary | ICD-10-CM | POA: Diagnosis not present

## 2014-09-07 DIAGNOSIS — I482 Chronic atrial fibrillation, unspecified: Secondary | ICD-10-CM

## 2014-09-07 DIAGNOSIS — Z7901 Long term (current) use of anticoagulants: Secondary | ICD-10-CM | POA: Diagnosis not present

## 2014-09-07 DIAGNOSIS — I272 Pulmonary hypertension, unspecified: Secondary | ICD-10-CM

## 2014-09-07 DIAGNOSIS — I270 Primary pulmonary hypertension: Secondary | ICD-10-CM | POA: Diagnosis not present

## 2014-09-07 LAB — POCT INR: INR: 1.7

## 2014-09-07 NOTE — Progress Notes (Signed)
Pre visit review using our clinic review tool, if applicable. No additional management support is needed unless otherwise documented below in the visit note. 

## 2014-09-28 DIAGNOSIS — L603 Nail dystrophy: Secondary | ICD-10-CM | POA: Diagnosis not present

## 2014-09-28 DIAGNOSIS — E1151 Type 2 diabetes mellitus with diabetic peripheral angiopathy without gangrene: Secondary | ICD-10-CM | POA: Diagnosis not present

## 2014-09-28 DIAGNOSIS — I739 Peripheral vascular disease, unspecified: Secondary | ICD-10-CM | POA: Diagnosis not present

## 2014-10-03 ENCOUNTER — Other Ambulatory Visit: Payer: Self-pay | Admitting: Internal Medicine

## 2014-10-05 ENCOUNTER — Ambulatory Visit (INDEPENDENT_AMBULATORY_CARE_PROVIDER_SITE_OTHER): Payer: Medicare Other | Admitting: General Practice

## 2014-10-05 DIAGNOSIS — Z7901 Long term (current) use of anticoagulants: Secondary | ICD-10-CM | POA: Diagnosis not present

## 2014-10-05 DIAGNOSIS — I270 Primary pulmonary hypertension: Secondary | ICD-10-CM

## 2014-10-05 DIAGNOSIS — Z5181 Encounter for therapeutic drug level monitoring: Secondary | ICD-10-CM | POA: Diagnosis not present

## 2014-10-05 DIAGNOSIS — I482 Chronic atrial fibrillation, unspecified: Secondary | ICD-10-CM

## 2014-10-05 DIAGNOSIS — I272 Pulmonary hypertension, unspecified: Secondary | ICD-10-CM

## 2014-10-05 LAB — POCT INR: INR: 1.8

## 2014-10-05 NOTE — Progress Notes (Signed)
Pre visit review using our clinic review tool, if applicable. No additional management support is needed unless otherwise documented below in the visit note. 

## 2014-10-19 ENCOUNTER — Encounter: Payer: Self-pay | Admitting: Internal Medicine

## 2014-10-19 ENCOUNTER — Other Ambulatory Visit: Payer: Self-pay | Admitting: *Deleted

## 2014-10-19 ENCOUNTER — Ambulatory Visit (INDEPENDENT_AMBULATORY_CARE_PROVIDER_SITE_OTHER): Payer: Medicare Other | Admitting: Internal Medicine

## 2014-10-19 ENCOUNTER — Other Ambulatory Visit (INDEPENDENT_AMBULATORY_CARE_PROVIDER_SITE_OTHER): Payer: Medicare Other

## 2014-10-19 VITALS — BP 116/70 | HR 70 | Temp 97.7°F | Ht 67.0 in | Wt 221.0 lb

## 2014-10-19 DIAGNOSIS — E785 Hyperlipidemia, unspecified: Secondary | ICD-10-CM

## 2014-10-19 DIAGNOSIS — E1140 Type 2 diabetes mellitus with diabetic neuropathy, unspecified: Secondary | ICD-10-CM

## 2014-10-19 DIAGNOSIS — I7101 Dissection of ascending aorta: Secondary | ICD-10-CM

## 2014-10-19 DIAGNOSIS — I10 Essential (primary) hypertension: Secondary | ICD-10-CM

## 2014-10-19 LAB — BASIC METABOLIC PANEL
BUN: 14 mg/dL (ref 6–23)
CHLORIDE: 100 meq/L (ref 96–112)
CO2: 33 mEq/L — ABNORMAL HIGH (ref 19–32)
Calcium: 10 mg/dL (ref 8.4–10.5)
Creatinine, Ser: 0.84 mg/dL (ref 0.40–1.50)
GFR: 92.92 mL/min (ref >60.00)
GLUCOSE: 157 mg/dL — AB (ref 70–99)
POTASSIUM: 4.1 meq/L (ref 3.5–5.1)
SODIUM: 138 meq/L (ref 135–145)

## 2014-10-19 LAB — LIPID PANEL
Cholesterol: 106 mg/dL (ref 0–200)
HDL: 41.6 mg/dL (ref >39.00)
LDL Cholesterol: 46 mg/dL (ref 0–99)
NONHDL: 64.42
Total CHOL/HDL Ratio: 3
Triglycerides: 94 mg/dL (ref 0.0–149.0)
VLDL: 18.8 mg/dL (ref 0.0–40.0)

## 2014-10-19 LAB — HEPATIC FUNCTION PANEL
ALBUMIN: 4.4 g/dL (ref 3.5–5.2)
ALK PHOS: 74 U/L (ref 39–117)
ALT: 13 U/L (ref 0–53)
AST: 19 U/L (ref 0–37)
BILIRUBIN TOTAL: 0.7 mg/dL (ref 0.2–1.2)
Bilirubin, Direct: 0.2 mg/dL (ref 0.0–0.3)
Total Protein: 7.9 g/dL (ref 6.0–8.3)

## 2014-10-19 LAB — HEMOGLOBIN A1C: Hgb A1c MFr Bld: 6.4 % (ref 4.6–6.5)

## 2014-10-19 NOTE — Assessment & Plan Note (Signed)
stable overall by history and exam, recent data reviewed with pt, and pt to continue medical treatment as before,  to f/u any worsening symptoms or concerns BP Readings from Last 3 Encounters:  10/19/14 116/70  08/23/14 132/80  07/21/14 136/72

## 2014-10-19 NOTE — Patient Instructions (Signed)

## 2014-10-19 NOTE — Assessment & Plan Note (Signed)
.  stable overall by history and exam, recent data reviewed with pt, and pt to continue medical treatment as before,  to f/u any worsening symptoms or concerns Lab Results  Component Value Date   LDLCALC 46 10/19/2014

## 2014-10-19 NOTE — Assessment & Plan Note (Signed)
stable overall by history and exam, recent data reviewed with pt, and pt to continue medical treatment as before,  to f/u any worsening symptoms or concerns Lab Results  Component Value Date   HGBA1C 6.4 10/19/2014

## 2014-10-19 NOTE — Progress Notes (Signed)
Subjective:    Patient ID: Eric Rivers, male    DOB: [birth year], 81 y.o.   MRN: [id number]  HPI  Here for yearly f/u;  Overall doing ok;  Pt denies Chest pain, worsening SOB, DOE, wheezing, orthopnea, PND, worsening LE edema, palpitations, dizziness or syncope.  Pt denies neurological change such as new headache, facial or extremity weakness.  Pt denies polydipsia, polyuria, or low sugar symptoms. Pt states overall good compliance with treatment and medications, good tolerability, and has been trying to follow appropriate diet.  Pt denies worsening depressive symptoms, suicidal ideation or panic. No fever, night sweats, wt loss, loss of appetite, or other constitutional symptoms.  Pt states good ability with ADL's, has low fall risk, home safety reviewed and adequate, no other significant changes in hearing or vision, and only occasionally active with exercise. No current complaints Past Medical History  Diagnosis Date  . Whooping cough   . Atrial fibrillation      Chronic,   24 hour holter, September, 2011.... atrial fib rate is controlled.... there is some bradycardia but  no marked pauses  . Diabetes mellitus     type 2  . Hypertension   . DDD (degenerative disc disease)     lumbar spine  . GERD (gastroesophageal reflux disease)   . Gout   . Fluid overload   . ACE-inhibitor cough   . Shortness of breath     on exertion  . Aortic stenosis     mild....echo... september... 2010/ mild... echo.[name] KitchenMarland KitchenDec, 2011  . Pulmonary hypertension     66mmHg, echo, 01/2010  . Hyperlipidemia   . Hx of colonoscopy     approx. 10 years  . Warfarin anticoagulation     Atrial fib  . Ejection fraction     EF 60%, echo, 01/2010  . Normal nuclear stress test     06/2005 , also ABI normal 2007  . Hypercholesterolemia   . Fracture of metacarpal bone 11/30/11  . Aortic dissection     Surgical repair February, 2014  . Mitral regurgitation   . Diabetes mellitus with neuropathy 12/04/2006    Qualifier:  Diagnosis of  By: Linda Hedges MD, Heinz Knuckles  medications - DPP4   . Bladder cancer     recurrence with TUR-B March '09  . Prostate cancer     radiation tx.  . Diverticulosis of colon with hemorrhage 01/01/2014   Past Surgical History  Procedure Laterality Date  . Lumbar laminectomy      '02  . Tur-bt '06, '09    . Bladder cancer biopsy  10/16/2010    negative for malignancy  . Bladder microscopic  04/13/2007    high grade papillary urothelial lesions  . 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 Prostate biopsy  09/25/2011    GLEASON 3+3=6 AND 4+3=7  . Ascending aortic root replacement N/A 04/15/2012    Procedure: ASCENDING AORTIC ROOT REPLACEMENT;  Surgeon: Gaye Pollack, MD;  Location: [facility];  Service: Open Heart Surgery;  Laterality: N/A;  . Tee without cardioversion N/A 04/15/2012    Procedure: TRANSESOPHAGEAL ECHOCARDIOGRAM (TEE);  Surgeon: Gaye Pollack, MD;  Location: [facility];  Service: Open Heart Surgery;  Laterality: N/A;  . Exploration post operative open heart  04/2012  . Cystoscopy/retrograde/ureteroscopy Bilateral 04/04/2013    Procedure: CYSTOSCOPY WITH RETROGRADE PYELOGRAM AND BLADDER BIOPSY;  Surgeon: Molli Hazard, MD;  Location: [facility];  Service: Urology;  Laterality: Bilateral;  .  Herniatic disc surgery    . Colonoscopy N/A 01/01/2014    Procedure: COLONOSCOPY;  Surgeon: Gatha Mayer, MD;  Location: [facility];  Service: Endoscopy;  Laterality: N/A;    reports that he quit smoking about 39 years ago. His smoking use included Cigarettes. He smoked 1.00 pack per day. He has never used smokeless tobacco. He reports that he drinks about 7.0 oz of alcohol per week. He reports that he does not use illicit drugs. family history includes Cancer in his father; Heart disease in his mother; Hypertension in his mother; Prostate cancer (age of onset: 57) in his father; Stroke in his father. Allergies  Allergen Reactions  . Metoprolol Other (See  Comments)    Marked bradycardia at 25 mg bid   Current Outpatient Prescriptions on File Prior to Visit  Medication Sig Dispense Refill  . acetaminophen (TYLENOL) 500 MG tablet Take 1,000 mg by mouth 3 (three) times daily as needed for mild pain, moderate pain, fever or headache.     . allopurinol (ZYLOPRIM) 300 MG tablet take 1 tablet by mouth once daily 90 tablet 1  . amLODipine (NORVASC) 2.5 MG tablet Take 1 tablet (2.5 mg total) by mouth daily. 90 tablet 3  . aspirin 325 MG tablet Take 325 mg by mouth daily.    [name] Kitchen atorvastatin (LIPITOR) 20 MG tablet take 1 tablet by mouth every morning 90 tablet 3  . Calcium Carb-Cholecalciferol (CALCIUM 600 + D PO) Take 1 tablet by mouth every morning.    [name] Kitchen desonide (DESOWEN) 0.05 % lotion Apply 1 application topically daily as needed (for face).    . finasteride (PROSCAR) 5 MG tablet Take 1 tablet (5 mg total) by mouth every morning. 90 tablet 3  . furosemide (LASIX) 40 MG tablet Take 40 mg by mouth daily.    [name] Kitchen JANUVIA 100 MG tablet take 1 tablet by mouth once daily 90 tablet 0  . ketoconazole (NIZORAL) 2 % shampoo Apply 1 application topically 4 (four) times a week.    . Multiple Vitamins-Minerals (ONCOVITE PO) Take 1 tablet by mouth daily.     . pantoprazole (PROTONIX) 40 MG tablet take 1 tablet by mouth once daily 90 tablet 3  . potassium chloride (K-DUR,KLOR-CON) 10 MEQ tablet Take 1 tablet (10 mEq total) by mouth daily. 90 tablet 1  . Tamsulosin HCl (FLOMAX) 0.4 MG CAPS Take 0.4 mg by mouth at bedtime.     . traZODone (DESYREL) 50 MG tablet take 1/2 to 1 tablet by mouth at bedtime if needed for sleep 90 tablet 1  . warfarin (COUMADIN) 4 MG tablet Take as directed by anticoagulation clinic 105 tablet 3   No current facility-administered medications on file prior to visit.   Review of Systems Constitutional: Negative for increased diaphoresis, other activity, appetite or siginficant weight change other than noted HENT: Negative for worsening hearing  loss, ear pain, facial swelling, mouth sores and neck stiffness.   Eyes: Negative for other worsening pain, redness or visual disturbance.  Respiratory: Negative for shortness of breath and wheezing  Cardiovascular: Negative for chest pain and palpitations.  Gastrointestinal: Negative for diarrhea, blood in stool, abdominal distention or other pain Genitourinary: Negative for hematuria, flank pain or change in urine volume.  Musculoskeletal: Negative for myalgias or other joint complaints.  Skin: Negative for color change and wound or drainage.  Neurological: Negative for syncope and numbness. other than noted Hematological: Negative for adenopathy. or other swelling Psychiatric/Behavioral: Negative for hallucinations, SI, self-injury, decreased concentration or  other worsening agitation.      Objective:   Physical Exam BP 116/70 mmHg  Pulse 70  Temp(Src) 97.7 F (36.5 C) (Oral)  Ht 5\' 7"  (1.702 m)  Wt 221 lb (100.245 kg)  BMI 34.61 kg/m2  SpO2 97% VS noted,  Constitutional: Pt is oriented to person, place, and time. Appears well-developed and well-nourished, in no significant distress Head: Normocephalic and atraumatic.  Right Ear: External ear normal.  Left Ear: External ear normal.  Nose: Nose normal.  Mouth/Throat: Oropharynx is clear and moist.  Eyes: Conjunctivae and EOM are normal. Pupils are equal, round, and reactive to light.  Neck: Normal range of motion. Neck supple. No JVD present. No tracheal deviation present or significant neck LA or mass Cardiovascular: Normal rate, regular rhythm, normal heart sounds and intact distal pulses.   Pulmonary/Chest: Effort normal and breath sounds without rales or wheezing  Abdominal: Soft. Bowel sounds are normal. NT. No HSM  Musculoskeletal: Normal range of motion. Exhibits no edema.  Lymphadenopathy:  Has no cervical adenopathy.  Neurological: Pt is alert and oriented to person, place, and time. Pt has normal reflexes. No cranial  nerve deficit. Motor grossly intact Skin: Skin is warm and dry. No rash noted.  Psychiatric:  Has normal mood and affect. Behavior is normal.     Assessment & Plan:

## 2014-10-19 NOTE — Progress Notes (Signed)
Pre visit review using our clinic review tool, if applicable. No additional management support is needed unless otherwise documented below in the visit note. 

## 2014-10-26 ENCOUNTER — Telehealth: Payer: Self-pay | Admitting: Internal Medicine

## 2014-10-26 ENCOUNTER — Ambulatory Visit (INDEPENDENT_AMBULATORY_CARE_PROVIDER_SITE_OTHER): Payer: Medicare Other | Admitting: General Practice

## 2014-10-26 DIAGNOSIS — I270 Primary pulmonary hypertension: Secondary | ICD-10-CM | POA: Diagnosis not present

## 2014-10-26 DIAGNOSIS — I482 Chronic atrial fibrillation, unspecified: Secondary | ICD-10-CM

## 2014-10-26 DIAGNOSIS — Z5181 Encounter for therapeutic drug level monitoring: Secondary | ICD-10-CM | POA: Diagnosis not present

## 2014-10-26 DIAGNOSIS — Z7901 Long term (current) use of anticoagulants: Secondary | ICD-10-CM

## 2014-10-26 DIAGNOSIS — I272 Pulmonary hypertension, unspecified: Secondary | ICD-10-CM

## 2014-10-26 LAB — POCT INR: INR: 2.2

## 2014-10-26 NOTE — Telephone Encounter (Signed)
Patient notified  He has an appt for today to get INR check with prescribing MD

## 2014-10-26 NOTE — Telephone Encounter (Signed)
Patient  with a history of diverticular bleed and he is coumadin.  Patient reports that he had a BM yesterday pm about 5 with a moderate amount of bleeding.  He reports one more episode an hour later of bright red blood with no stool. Denies clots reports about 1/4 cup.    He has not had any additional bleeding since.  No rectal pain, nausea, vomiting, diarrhea or constipation.  "I do not feel bad or weak".  He is reassured most likely from a hemorrhoid source since he has not had any additional bleeding.  He will monitor for any additional bleeding.  If he is passing blood independent of stool with lower abdominal cramping and clots should proceed to the ER.  He will call back for any additional minor rectal bleeding for an appt.

## 2014-10-26 NOTE — Telephone Encounter (Signed)
Agree but if has not had a recent INR (past week) I suggest he get it tested to be sure in range

## 2014-10-26 NOTE — Progress Notes (Signed)
Pre visit review using our clinic review tool, if applicable. No additional management support is needed unless otherwise documented below in the visit note. 

## 2014-11-02 ENCOUNTER — Ambulatory Visit: Payer: Medicare Other

## 2014-11-09 DIAGNOSIS — Z23 Encounter for immunization: Secondary | ICD-10-CM | POA: Diagnosis not present

## 2014-11-22 ENCOUNTER — Encounter: Payer: Self-pay | Admitting: Cardiology

## 2014-11-22 ENCOUNTER — Ambulatory Visit (INDEPENDENT_AMBULATORY_CARE_PROVIDER_SITE_OTHER): Payer: Medicare Other | Admitting: Cardiology

## 2014-11-22 VITALS — BP 130/68 | HR 73 | Ht 67.0 in | Wt 223.0 lb

## 2014-11-22 DIAGNOSIS — I5032 Chronic diastolic (congestive) heart failure: Secondary | ICD-10-CM

## 2014-11-22 DIAGNOSIS — I7101 Dissection of ascending aorta: Secondary | ICD-10-CM

## 2014-11-22 DIAGNOSIS — Z7901 Long term (current) use of anticoagulants: Secondary | ICD-10-CM

## 2014-11-22 DIAGNOSIS — I350 Nonrheumatic aortic (valve) stenosis: Secondary | ICD-10-CM

## 2014-11-22 DIAGNOSIS — I482 Chronic atrial fibrillation, unspecified: Secondary | ICD-10-CM

## 2014-11-22 NOTE — Assessment & Plan Note (Signed)
Patient has mild aortic stenosis. No further workup.

## 2014-11-22 NOTE — Progress Notes (Signed)
Cardiology Office Note   Date:  11/22/2014   ID:  Cleve, Paolillo [birth year], MRN [id number]  PCP:  Cathlean Cower, MD  Cardiologist:  Dola Argyle, MD   Chief Complaint  Patient presents with  . Appointment    Follow-up CHF      History of Present Illness: Eric Rivers is a 81 y.o. male who presents today to follow up CHF. He has chronic stable exertional shortness of breath. He has no PND or orthopnea. He is going about his usual activities. He is not having any chest pain.    Past Medical History  Diagnosis Date  . Whooping cough   . Atrial fibrillation      Chronic,   24 hour holter, September, 2011.... atrial fib rate is controlled.... there is some bradycardia but  no marked pauses  . Diabetes mellitus     type 2  . Hypertension   . DDD (degenerative disc disease)     lumbar spine  . GERD (gastroesophageal reflux disease)   . Gout   . Fluid overload   . ACE-inhibitor cough   . Shortness of breath     on exertion  . Aortic stenosis     mild....echo... september... 2010/ mild... echo.[name] KitchenMarland KitchenDec, 2011  . Pulmonary hypertension     14mmHg, echo, 01/2010  . Hyperlipidemia   . Hx of colonoscopy     approx. 10 years  . Warfarin anticoagulation     Atrial fib  . Ejection fraction     EF 60%, echo, 01/2010  . Normal nuclear stress test     06/2005 , also ABI normal 2007  . Hypercholesterolemia   . Fracture of metacarpal bone 11/30/11  . Aortic dissection     Surgical repair February, 2014  . Mitral regurgitation   . Diabetes mellitus with neuropathy 12/04/2006    Qualifier: Diagnosis of  By: Linda Hedges MD, Heinz Knuckles  medications - DPP4   . Bladder cancer     recurrence with TUR-B March '09  . Prostate cancer     radiation tx.  . Diverticulosis of colon with hemorrhage 01/01/2014    Past Surgical History  Procedure Laterality Date  . Lumbar laminectomy      '02  . Tur-bt '06, '09    . Bladder cancer biopsy  10/16/2010    negative for malignancy  .  Bladder microscopic  04/13/2007    high grade papillary urothelial lesions  . 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 Prostate biopsy  09/25/2011    GLEASON 3+3=6 AND 4+3=7  . Ascending aortic root replacement N/A 04/15/2012    Procedure: ASCENDING AORTIC ROOT REPLACEMENT;  Surgeon: Gaye Pollack, MD;  Location: [facility];  Service: Open Heart Surgery;  Laterality: N/A;  . Tee without cardioversion N/A 04/15/2012    Procedure: TRANSESOPHAGEAL ECHOCARDIOGRAM (TEE);  Surgeon: Gaye Pollack, MD;  Location: [facility];  Service: Open Heart Surgery;  Laterality: N/A;  . Exploration post operative open heart  04/2012  . Cystoscopy/retrograde/ureteroscopy Bilateral 04/04/2013    Procedure: CYSTOSCOPY WITH RETROGRADE PYELOGRAM AND BLADDER BIOPSY;  Surgeon: Molli Hazard, MD;  Location: [facility];  Service: Urology;  Laterality: Bilateral;  . Herniatic disc surgery    . Colonoscopy N/A 01/01/2014    Procedure: COLONOSCOPY;  Surgeon: Gatha Mayer, MD;  Location: [facility];  Service: Endoscopy;  Laterality: N/A;    Patient Active Problem List   Diagnosis Date Noted  .  Warfarin anticoagulation     Priority: High  . Cough 04/20/2014  . Bradycardia 01/06/2014  . Diverticulosis of colon with hemorrhage 01/01/2014  . Perianal dermatitis 01/01/2014  . Chronic anemia 12/29/2013  . Lower GI bleed 12/29/2013  . Warfarin-induced coagulopathy 12/29/2013  . Acute blood loss anemia 12/29/2013  . Chronic diastolic CHF (congestive heart failure) 11/02/2013  . Syncope 07/19/2013  . Advanced care planning/counseling discussion 04/12/2013  . Encounter for therapeutic drug monitoring 04/12/2013  . Insomnia 04/11/2013  . Mitral regurgitation   . Ascending aortic dissection 04/16/2012  . Malignant neoplasm of prostate 10/27/2011  . Hypertension   . GERD (gastroesophageal reflux disease)   . Aortic stenosis   . Pulmonary hypertension   . Hyperlipidemia   . Ejection fraction    . Normal nuclear stress test   . Routine general medical examination at a health care facility 08/22/2010  . Bladder cancer   . Gout   . Chronic atrial fibrillation   . HYPERTROPHY PROSTATE W/O UR OBST & OTH LUTS 09/07/2007  . DEGENERATIVE JOINT DISEASE, GENERALIZED 09/07/2007  . NEOP, MALIGNANT, BLADDER NEC 12/04/2006  . Diabetes mellitus with neuropathy 12/04/2006      Current Outpatient Prescriptions  Medication Sig Dispense Refill  . acetaminophen (TYLENOL) 500 MG tablet Take 1,000 mg by mouth 3 (three) times daily as needed for mild pain, moderate pain, fever or headache.     . allopurinol (ZYLOPRIM) 300 MG tablet take 1 tablet by mouth once daily 90 tablet 1  . amLODipine (NORVASC) 2.5 MG tablet Take 1 tablet (2.5 mg total) by mouth daily. 90 tablet 3  . aspirin 325 MG tablet Take 325 mg by mouth daily.    [name] Kitchen atorvastatin (LIPITOR) 20 MG tablet take 1 tablet by mouth every morning 90 tablet 3  . Calcium Carb-Cholecalciferol (CALCIUM 600 + D PO) Take 1 tablet by mouth at bedtime and may repeat dose one time if needed.     . desonide (DESOWEN) 0.05 % lotion Apply 1 application topically daily as needed (for face).    . finasteride (PROSCAR) 5 MG tablet Take 1 tablet (5 mg total) by mouth every morning. 90 tablet 3  . furosemide (LASIX) 40 MG tablet Take 40 mg by mouth daily.    [name] Kitchen JANUVIA 100 MG tablet take 1 tablet by mouth once daily 90 tablet 0  . ketoconazole (NIZORAL) 2 % shampoo Apply 1 application topically 4 (four) times a week.    . Multiple Vitamins-Minerals (ONCOVITE PO) Take 1 tablet by mouth daily.     . pantoprazole (PROTONIX) 40 MG tablet take 1 tablet by mouth once daily 90 tablet 3  . potassium chloride (K-DUR,KLOR-CON) 10 MEQ tablet Take 1 tablet (10 mEq total) by mouth daily. 90 tablet 1  . Tamsulosin HCl (FLOMAX) 0.4 MG CAPS Take 0.4 mg by mouth at bedtime.     . traZODone (DESYREL) 50 MG tablet take 1/2 to 1 tablet by mouth at bedtime if needed for sleep 90  tablet 1  . warfarin (COUMADIN) 4 MG tablet Take as directed by anticoagulation clinic 105 tablet 3   No current facility-administered medications for this visit.    Allergies:   Metoprolol    Social History:  The patient  reports that he quit smoking about 39 years ago. His smoking use included Cigarettes. He smoked 1.00 pack per day. He has never used smokeless tobacco. He reports that he drinks about 7.0 oz of alcohol per week. He reports that he does  not use illicit drugs.   Family History:  The patient's family history includes Cancer in his father; Heart attack in his mother; Heart disease in his mother; Hypertension in his mother; Prostate cancer (age of onset: 70) in his father; Stroke in his father.    ROS:  Please see the history of present illness.   Patient denies fever, chills, headache, sweats, rash, change in vision, change in hearing, chest pain, cough, nausea or vomiting,.  He does have frequent urination at nighttime. This is a chronic problem.     PHYSICAL EXAM: VS:  BP 130/68 mmHg  Pulse 73  Ht 5\' 7"  (1.702 m)  Wt 223 lb (101.152 kg)  BMI 34.92 kg/m2 , Patient is oriented to person time and place. Affect is normal. He is overweight. Head is atraumatic. Sclera and conjunctiva are normal. There is no jugulovenous distention. Lungs are clear. Respiratory effort is not labored. Cardiac exam reveals S1 and S2. The rhythm is irregularly irregular. The rate is controlled. The abdomen is obese but soft. There is trace peripheral edema. There are no musculoskeletal deformities. There are no skin rashes.   EKG:   EKG is done today. There is atrial fibrillation with a controlled rate. There is decreased anterior R-wave progression. No change from the past.   Recent Labs: 04/20/2014: Hemoglobin 9.9*; Platelets 260.0 10/19/2014: ALT 13; BUN 14; Creatinine, Ser 0.84; Potassium 4.1; Sodium 138    Lipid Panel    Component Value Date/Time   CHOL 106 10/19/2014 1107   TRIG 94.0  10/19/2014 1107   HDL 41.60 10/19/2014 1107   CHOLHDL 3 10/19/2014 1107   VLDL 18.8 10/19/2014 1107   LDLCALC 46 10/19/2014 1107      Wt Readings from Last 3 Encounters:  11/22/14 223 lb (101.152 kg)  10/19/14 221 lb (100.245 kg)  08/23/14 220 lb (99.791 kg)      Current medicines are reviewed  The patient understands his medications.     ASSESSMENT AND PLAN:

## 2014-11-22 NOTE — Assessment & Plan Note (Signed)
The patient had emergent ascending aorta replacement with a graft in 2014. He had supra-coronary attachment of this graft. He's done very well.

## 2014-11-22 NOTE — Assessment & Plan Note (Signed)
The patient continues on his Coumadin for atrial fibrillation. No change in therapy.

## 2014-11-22 NOTE — Assessment & Plan Note (Signed)
His volume status is stable. No change in therapy. 

## 2014-11-22 NOTE — Patient Instructions (Signed)
Medication Instructions:  Same-no changes  Labwork: None  Testing/Procedures: None  Follow-Up: Your physician wants you to follow-up in: 4 months with Dr Meda Coffee. You will receive a reminder letter in the mail two months in advance. If you don't receive a letter, please call our office to schedule the follow-up appointment.

## 2014-11-22 NOTE — Assessment & Plan Note (Signed)
Atrial fibrillation is chronic. Rate is controlled. He is anticoagulated. No change in therapy. 

## 2014-11-23 ENCOUNTER — Ambulatory Visit (INDEPENDENT_AMBULATORY_CARE_PROVIDER_SITE_OTHER): Payer: Medicare Other | Admitting: General Practice

## 2014-11-23 DIAGNOSIS — I270 Primary pulmonary hypertension: Secondary | ICD-10-CM

## 2014-11-23 DIAGNOSIS — Z7901 Long term (current) use of anticoagulants: Secondary | ICD-10-CM | POA: Diagnosis not present

## 2014-11-23 DIAGNOSIS — Z5181 Encounter for therapeutic drug level monitoring: Secondary | ICD-10-CM

## 2014-11-23 DIAGNOSIS — I482 Chronic atrial fibrillation, unspecified: Secondary | ICD-10-CM

## 2014-11-23 DIAGNOSIS — I272 Pulmonary hypertension, unspecified: Secondary | ICD-10-CM

## 2014-11-23 LAB — POCT INR: INR: 2.3

## 2014-11-23 NOTE — Progress Notes (Signed)
Pre visit review using our clinic review tool, if applicable. No additional management support is needed unless otherwise documented below in the visit note. 

## 2014-11-29 ENCOUNTER — Encounter: Payer: Self-pay | Admitting: Surgery

## 2014-11-29 ENCOUNTER — Ambulatory Visit
Admission: RE | Admit: 2014-11-29 | Discharge: 2014-11-29 | Disposition: A | Discharge status: Home / Self Care | Payer: Medicare Other | Source: Ambulatory Visit | Attending: Surgery | Admitting: Surgery

## 2014-11-29 ENCOUNTER — Ambulatory Visit (INDEPENDENT_AMBULATORY_CARE_PROVIDER_SITE_OTHER): Payer: Medicare Other | Admitting: Surgery

## 2014-11-29 VITALS — BP 179/80 | HR 75 | Resp 20 | Ht 67.0 in | Wt 224.0 lb

## 2014-11-29 DIAGNOSIS — I7101 Dissection of ascending aorta: Secondary | ICD-10-CM

## 2014-11-29 DIAGNOSIS — I71019 Dissection of thoracic aorta, unspecified: Secondary | ICD-10-CM

## 2014-11-29 DIAGNOSIS — I719 Aortic aneurysm of unspecified site, without rupture: Secondary | ICD-10-CM | POA: Diagnosis not present

## 2014-11-29 MED ORDER — IOPAMIDOL (ISOVUE-370) INJECTION 76%
100.0000 mL | Freq: Once | INTRAVENOUS | Status: AC | PRN
  Administered 2014-11-29: 100 mL via INTRAVENOUS

## 2014-11-29 NOTE — Progress Notes (Signed)
HPI:  The patient returns today for follow up of a type A dissection, s/p repair with a supracoronary tube graft on 04/15/2012. He has continued to do well overall but has had some rectal bleeding due to diverticulosis since I saw him last year. He denies any chest or back pain.  Current Outpatient Prescriptions  Medication Sig Dispense Refill  . acetaminophen (TYLENOL) 500 MG tablet Take 1,000 mg by mouth 3 (three) times daily as needed for mild pain, moderate pain, fever or headache.     . allopurinol (ZYLOPRIM) 300 MG tablet take 1 tablet by mouth once daily 90 tablet 1  . amLODipine (NORVASC) 2.5 MG tablet Take 1 tablet (2.5 mg total) by mouth daily. 90 tablet 3  . aspirin 325 MG tablet Take 325 mg by mouth daily.    [name] Kitchen atorvastatin (LIPITOR) 20 MG tablet take 1 tablet by mouth every morning 90 tablet 3  . Calcium Carb-Cholecalciferol (CALCIUM 600 + D PO) Take 1 tablet by mouth at bedtime and may repeat dose one time if needed.     . desonide (DESOWEN) 0.05 % lotion Apply 1 application topically daily as needed (for face).    . finasteride (PROSCAR) 5 MG tablet Take 1 tablet (5 mg total) by mouth every morning. 90 tablet 3  . furosemide (LASIX) 40 MG tablet Take 40 mg by mouth daily.    [name] Kitchen JANUVIA 100 MG tablet take 1 tablet by mouth once daily 90 tablet 0  . ketoconazole (NIZORAL) 2 % shampoo Apply 1 application topically 4 (four) times a week.    . Multiple Vitamins-Minerals (ONCOVITE PO) Take 1 tablet by mouth daily.     . pantoprazole (PROTONIX) 40 MG tablet take 1 tablet by mouth once daily 90 tablet 3  . potassium chloride (K-DUR,KLOR-CON) 10 MEQ tablet Take 1 tablet (10 mEq total) by mouth daily. 90 tablet 1  . Tamsulosin HCl (FLOMAX) 0.4 MG CAPS Take 0.4 mg by mouth at bedtime.     . traZODone (DESYREL) 50 MG tablet take 1/2 to 1 tablet by mouth at bedtime if needed for sleep 90 tablet 1  . warfarin (COUMADIN) 4 MG tablet Take as directed by anticoagulation clinic 105 tablet  3   No current facility-administered medications for this visit.     Physical Exam: BP 179/80 mmHg  Pulse 75  Resp 20  Ht 5\' 7"  (1.702 m)  Wt 224 lb (101.606 kg)  BMI 35.08 kg/m2  SpO2 96% He looks well Cardiac exam shows a regular rate and rhythm with normal heart sounds and no murmurs. Lungs are clear  Diagnostic Tests:  CLINICAL DATA: Aortic Aneurysm/Dissection post surgical repair 2/14. No pain or shortness of breath.  EXAM: CT ANGIOGRAPHY CHEST, ABDOMEN AND PELVIS  TECHNIQUE: Multidetector CT imaging through the chest, abdomen and pelvis was performed using the standard protocol during bolus administration of intravenous contrast. Multiplanar reconstructed images including MIPs were obtained and reviewed to evaluate the vascular anatomy.  CONTRAST: 100 mL Isovue 370 IV  COMPARISON: 11/23/2013  FINDINGS: CTA CHEST  Noncontrast scan shows stable changes of median sternotomy and tube graft repair of the ascending aorta. Heavy coronary calcifications. No hyperdense crescent or mediastinal hematoma. Displaced intimal calcifications in the distal descending thoracic are stable.  CTA via right arm contrast injection. The SVC is patent. There is marked right atrial dilatation. The RV is nondilated. Central pulmonary arteries are dilated, suggesting pulmonary hypertension. Satisfactory opacification of pulmonary arteries noted, and there is  no evidence of pulmonary emboli. Patent pulmonary veins bilaterally. Marked left atrial enlargement. Suspect some thrombus in the left atrial appendage. Ascending aortic graft stable. Stable 5.3 cm diameter distal arch and native proximal arch. Distal arch 4.9 cm diameter. Stable thrombosed false lumen of dissection in the distal arch and proximal descending thoracic segment. Some enhancement of the false lumen in the distal descending thoracic segment as before. Classic 3 vessel brachiocephalic arterial origin anatomy  without proximal stenosis. Thrombosed false lumen of dissection versus eccentric plaque in the brachiocephalic artery, stable, without flow limiting stenosis.  No pleural or pericardial effusion. Subcentimeter prevascular, AP window, and right paratracheal lymph nodes. 7 mm nodule adjacent to the right minor fissure image 33/5, stable, possibly intrapulmonary lymph node. Linear scarring or subsegmental atelectasis posteriorly in both lung bases. 6 mm subpleural nodule, lateral basal segment left lower lobe, present since 04/15/2012, presumably benign.  Review of the MIP images confirms the above findings.  CTA ABDOMEN AND PELVIS  Arterial findings:  Aorta: Aortic dissection flap extends through the abdominal aorta as before, with patent false lumen. Aorta measures up to 3.1 cm diameter at the level of renal arteries, tapering to 2.6 cm above the bifurcation.  Celiac axis: Patent, arising from false lumen.  Superior mesenteric: Patent, supply by false lumen. Replaced right hepatic arterial supply, an anatomic variant.  Left renal: Patent, supplied by true lumen.  Right renal: Dissection flap extends through the length of the single right renal artery which is dilated up to 11 mm. Major branch vessels appear patent.  Inferior mesenteric: Patent, supplied by false lumen.  Left iliac: Aortic dissection flap extends to the mid common iliac artery without stenosis. There is scattered calcified plaque in the common iliac artery without stenosis or aneurysm. Internal and external iliac arteries patent.  Right iliac: Aortic dissection flap does not go beyond the aortic bifurcation on the right. There is some eccentric plaque in the common iliac, internal and external iliac arteries. No aneurysm, dissection, or stenosis.  Venous findings: Venous phase imaging not obtained.  Review of the MIP images confirms the above findings.  Nonvascular  findings: Unremarkable arterial phase evaluation of liver, spleen, adrenal glands, pancreas. Multiple subcentimeter partially calcified stones in the dependent aspect of the nondilated gallbladder. Symmetric renal parenchymal enhancement without hydronephrosis. Probable cyst from the upper pole right kidney, incompletely characterized. Stomach, small bowel, and colon are nondilated. Urinary bladder is physiologically distended. There is a 7 mm partially calcified calculus in the dependent aspect of the urinary bladder. Moderate prostatic enlargement with some central coarse calcifications and 3 fiducial markers noted. Bilateral pelvic phleboliths. No adenopathy localized. No free air. No ascites. Small umbilical hernia containing only mesenteric fat. Grade 1 anterolisthesis L4-5 probably secondary to advanced facet disease at this level.  IMPRESSION: 1. Stable appearance of ascending aortic repair, and persistent descending and abdominal aortic dissection flap as detailed above. 2. 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 3. Marked biatrial enlargement. Thrombus in the left atrial appendage is more conspicuous than on prior exam. 4. Extension of dissection flap through the main right renal artery and into the proximal left common iliac artery, stable compared to prior exam. 5. Cholelithiasis 6. Bladder calculus.   Electronically Signed  By: Lucrezia Europe M.D.  On: 11/29/2014 10:48  Impression:  The aortic dissection looks stable with no change in the size of the aortic arch which is moderately aneurysmal at 5.3 cm. The false lumen in the descending thoracic aorta is  thrombosed and significantly smaller with remodeling of the aorta. There is a stable 6-7 mm nodule in the right major fissure. Overall I think things look stable  with no progressive aneurysmal change in the aorta. I have stressed the importance of continued good BP control.  Plan:  I will see him back  in 1 year with a CTA of the chest and abdomen.    Gaye Pollack, MD [facility] [phone]

## 2014-11-30 ENCOUNTER — Encounter: Payer: Self-pay | Admitting: Internal Medicine

## 2014-11-30 DIAGNOSIS — H2513 Age-related nuclear cataract, bilateral: Secondary | ICD-10-CM | POA: Diagnosis not present

## 2014-11-30 DIAGNOSIS — E119 Type 2 diabetes mellitus without complications: Secondary | ICD-10-CM | POA: Diagnosis not present

## 2014-11-30 LAB — HM DIABETES EYE EXAM

## 2014-12-14 DIAGNOSIS — L603 Nail dystrophy: Secondary | ICD-10-CM | POA: Diagnosis not present

## 2014-12-14 DIAGNOSIS — E1151 Type 2 diabetes mellitus with diabetic peripheral angiopathy without gangrene: Secondary | ICD-10-CM | POA: Diagnosis not present

## 2014-12-14 DIAGNOSIS — I739 Peripheral vascular disease, unspecified: Secondary | ICD-10-CM | POA: Diagnosis not present

## 2014-12-20 ENCOUNTER — Ambulatory Visit (INDEPENDENT_AMBULATORY_CARE_PROVIDER_SITE_OTHER): Payer: Medicare Other | Admitting: General Practice

## 2014-12-20 DIAGNOSIS — Z7901 Long term (current) use of anticoagulants: Secondary | ICD-10-CM | POA: Diagnosis not present

## 2014-12-20 DIAGNOSIS — Z5181 Encounter for therapeutic drug level monitoring: Secondary | ICD-10-CM | POA: Diagnosis not present

## 2014-12-20 DIAGNOSIS — I272 Other secondary pulmonary hypertension: Secondary | ICD-10-CM | POA: Diagnosis not present

## 2014-12-20 DIAGNOSIS — I482 Chronic atrial fibrillation, unspecified: Secondary | ICD-10-CM

## 2014-12-20 LAB — POCT INR: INR: 2.7

## 2014-12-20 NOTE — Progress Notes (Signed)
Pre visit review using our clinic review tool, if applicable. No additional management support is needed unless otherwise documented below in the visit note. 

## 2014-12-20 NOTE — Progress Notes (Signed)
I have reviewed and agree with the plan. 

## 2014-12-30 ENCOUNTER — Other Ambulatory Visit: Payer: Self-pay | Admitting: Internal Medicine

## 2015-01-13 ENCOUNTER — Other Ambulatory Visit: Payer: Self-pay | Admitting: Internal Medicine

## 2015-01-18 ENCOUNTER — Ambulatory Visit (INDEPENDENT_AMBULATORY_CARE_PROVIDER_SITE_OTHER): Payer: Medicare Other | Admitting: General Practice

## 2015-01-18 DIAGNOSIS — I482 Chronic atrial fibrillation, unspecified: Secondary | ICD-10-CM

## 2015-01-18 DIAGNOSIS — I272 Other secondary pulmonary hypertension: Secondary | ICD-10-CM | POA: Diagnosis not present

## 2015-01-18 DIAGNOSIS — Z5181 Encounter for therapeutic drug level monitoring: Secondary | ICD-10-CM

## 2015-01-18 DIAGNOSIS — Z7901 Long term (current) use of anticoagulants: Secondary | ICD-10-CM

## 2015-01-18 LAB — POCT INR: INR: 2.4

## 2015-01-18 NOTE — Progress Notes (Signed)
Pre visit review using our clinic review tool, if applicable. No additional management support is needed unless otherwise documented below in the visit note. 

## 2015-01-18 NOTE — Progress Notes (Signed)
Ok with this plan 

## 2015-02-13 DIAGNOSIS — C61 Malignant neoplasm of prostate: Secondary | ICD-10-CM | POA: Diagnosis not present

## 2015-03-01 ENCOUNTER — Ambulatory Visit (INDEPENDENT_AMBULATORY_CARE_PROVIDER_SITE_OTHER): Payer: Medicare Other | Admitting: General Practice

## 2015-03-01 DIAGNOSIS — I272 Other secondary pulmonary hypertension: Secondary | ICD-10-CM

## 2015-03-01 DIAGNOSIS — E1151 Type 2 diabetes mellitus with diabetic peripheral angiopathy without gangrene: Secondary | ICD-10-CM | POA: Diagnosis not present

## 2015-03-01 DIAGNOSIS — I482 Chronic atrial fibrillation, unspecified: Secondary | ICD-10-CM

## 2015-03-01 DIAGNOSIS — L603 Nail dystrophy: Secondary | ICD-10-CM | POA: Diagnosis not present

## 2015-03-01 DIAGNOSIS — Z7901 Long term (current) use of anticoagulants: Secondary | ICD-10-CM | POA: Diagnosis not present

## 2015-03-01 DIAGNOSIS — I739 Peripheral vascular disease, unspecified: Secondary | ICD-10-CM | POA: Diagnosis not present

## 2015-03-01 LAB — POCT INR: INR: 2.4

## 2015-03-01 NOTE — Progress Notes (Signed)
Pre visit review using our clinic review tool, if applicable. No additional management support is needed unless otherwise documented below in the visit note. 

## 2015-03-02 DIAGNOSIS — C61 Malignant neoplasm of prostate: Secondary | ICD-10-CM | POA: Diagnosis not present

## 2015-03-02 DIAGNOSIS — N401 Enlarged prostate with lower urinary tract symptoms: Secondary | ICD-10-CM | POA: Diagnosis not present

## 2015-03-02 DIAGNOSIS — C670 Malignant neoplasm of trigone of bladder: Secondary | ICD-10-CM | POA: Diagnosis not present

## 2015-03-02 DIAGNOSIS — R350 Frequency of micturition: Secondary | ICD-10-CM | POA: Diagnosis not present

## 2015-03-03 ENCOUNTER — Other Ambulatory Visit: Payer: Self-pay | Admitting: Internal Medicine

## 2015-03-06 NOTE — Telephone Encounter (Signed)
Trazodone done erx 

## 2015-04-06 ENCOUNTER — Encounter: Payer: Self-pay | Admitting: Cardiology

## 2015-04-06 ENCOUNTER — Ambulatory Visit (INDEPENDENT_AMBULATORY_CARE_PROVIDER_SITE_OTHER): Payer: Medicare Other | Admitting: Cardiology

## 2015-04-06 VITALS — BP 138/80 | HR 76 | Ht 67.0 in | Wt 236.0 lb

## 2015-04-06 DIAGNOSIS — I5032 Chronic diastolic (congestive) heart failure: Secondary | ICD-10-CM | POA: Diagnosis not present

## 2015-04-06 DIAGNOSIS — I481 Persistent atrial fibrillation: Secondary | ICD-10-CM

## 2015-04-06 DIAGNOSIS — I482 Chronic atrial fibrillation, unspecified: Secondary | ICD-10-CM

## 2015-04-06 DIAGNOSIS — I4819 Other persistent atrial fibrillation: Secondary | ICD-10-CM

## 2015-04-06 DIAGNOSIS — I272 Other secondary pulmonary hypertension: Secondary | ICD-10-CM

## 2015-04-06 DIAGNOSIS — I350 Nonrheumatic aortic (valve) stenosis: Secondary | ICD-10-CM | POA: Diagnosis not present

## 2015-04-06 NOTE — Patient Instructions (Signed)
Your physician recommends that you continue on your current medications as directed. Please refer to the Current Medication list given to you today.     Your physician wants you to follow-up in: 6 MONTHS WITH DR NELSON You will receive a reminder letter in the mail two months in advance. If you don't receive a letter, please call our office to schedule the follow-up appointment.  

## 2015-04-06 NOTE — Progress Notes (Signed)
Patient ID: Eric Rivers, male   DOB: 1931-02-06, 82 y.o.   MRN: [id number]      Cardiology Office Note  Date:  04/06/2015   ID:  Eric, Rivers [birth year], MRN [id number]  PCP:  Cathlean Cower, MD  Cardiologist:  Dr Ron Parker --> Meda Coffee, Jamse Belfast, MD   No chief complaint on file.  History of Present Illness: Eric Rivers is a 82 y.o. male who presents for 5 months follow up for CHF. The patient is a very pleasant gentleman with chronic atrial fibrillation status post emergent surgery for ascending aortic dissection 2014 with reimplantation of his coronary arteries. He has chronic stable exertional shortness of breath. He has no PND or orthopnea. He is going about his usual activities. He is not having any chest pain. The patient enjoyed life he is traveling around the world currently planning 3 week long crus to the current BNP. He denies any bleeding in the last year no melena's other than year ago with episodes of diverticulosis. He's compliant with his meds. His only concern is occasional dizziness. No syncope.  Past Medical History  Diagnosis Date  . Whooping cough   . Atrial fibrillation      Chronic,   24 hour holter, September, 2011.... atrial fib rate is controlled.... there is some bradycardia but  no marked pauses  . Diabetes mellitus     type 2  . Hypertension   . DDD (degenerative disc disease)     lumbar spine  . GERD (gastroesophageal reflux disease)   . Gout   . Fluid overload   . ACE-inhibitor cough   . Shortness of breath     on exertion  . Aortic stenosis     mild....echo... september... 2010/ mild... echo.[name] KitchenMarland KitchenDec, 2011  . Pulmonary hypertension     16mmHg, echo, 01/2010  . Hyperlipidemia   . Hx of colonoscopy     approx. 10 years  . Warfarin anticoagulation     Atrial fib  . Ejection fraction     EF 60%, echo, 01/2010  . Normal nuclear stress test     06/2005 , also ABI normal 2007  . Hypercholesterolemia   . Fracture of metacarpal bone 11/30/11    . Aortic dissection     Surgical repair February, 2014  . Mitral regurgitation   . Diabetes mellitus with neuropathy 12/04/2006    Qualifier: Diagnosis of  By: Linda Hedges MD, Heinz Knuckles  medications - DPP4   . Bladder cancer     recurrence with TUR-B March '09  . Prostate cancer     radiation tx.  . Diverticulosis of colon with hemorrhage 01/01/2014    Past Surgical History  Procedure Laterality Date  . Lumbar laminectomy      '02  . Tur-bt '06, '09    . Bladder cancer biopsy  10/16/2010    negative for malignancy  . Bladder microscopic  04/13/2007    high grade papillary urothelial lesions  . 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 Prostate biopsy  09/25/2011    GLEASON 3+3=6 AND 4+3=7  . Ascending aortic root replacement N/A 04/15/2012    Procedure: ASCENDING AORTIC ROOT REPLACEMENT;  Surgeon: Gaye Pollack, MD;  Location: [facility];  Service: Open Heart Surgery;  Laterality: N/A;  . Tee without cardioversion N/A 04/15/2012    Procedure: TRANSESOPHAGEAL ECHOCARDIOGRAM (TEE);  Surgeon: Gaye Pollack, MD;  Location: [facility];  Service: Open Heart Surgery;  Laterality: N/A;  . Exploration post operative open heart  04/2012  . Cystoscopy/retrograde/ureteroscopy Bilateral 04/04/2013    Procedure: CYSTOSCOPY WITH RETROGRADE PYELOGRAM AND BLADDER BIOPSY;  Surgeon: Molli Hazard, MD;  Location: [facility];  Service: Urology;  Laterality: Bilateral;  . Herniatic disc surgery    . Colonoscopy N/A 01/01/2014    Procedure: COLONOSCOPY;  Surgeon: Gatha Mayer, MD;  [facility]: [facility];  Service: Endoscopy;  Laterality: N/A;   Current Outpatient Prescriptions  Medication Sig Dispense Refill  . acetaminophen (TYLENOL) 500 MG tablet Take 1,000 mg by mouth 3 (three) times daily as needed for mild pain, moderate pain, fever or headache.     . allopurinol (ZYLOPRIM) 300 MG tablet take 1 tablet by mouth once daily 90 tablet 1  . amLODipine (NORVASC) 2.5 MG tablet Take  1 tablet (2.5 mg total) by mouth daily. 90 tablet 3  . aspirin 325 MG tablet Take 325 mg by mouth daily.    [name] Kitchen atorvastatin (LIPITOR) 20 MG tablet take 1 tablet by mouth every morning 90 tablet 3  . Calcium Carb-Cholecalciferol (CALCIUM 600 + D PO) Take 1 tablet by mouth at bedtime and may repeat dose one time if needed.     . desonide (DESOWEN) 0.05 % lotion Apply 1 application topically daily as needed (for face).    . finasteride (PROSCAR) 5 MG tablet Take 1 tablet (5 mg total) by mouth every morning. 90 tablet 3  . furosemide (LASIX) 40 MG tablet Take 40 mg by mouth daily.    [name] Kitchen JANUVIA 100 MG tablet take 1 tablet by mouth once daily 90 tablet 2  . ketoconazole (NIZORAL) 2 % shampoo Apply 1 application topically 4 (four) times a week.    . Multiple Vitamins-Minerals (ONCOVITE PO) Take 1 tablet by mouth daily.     . pantoprazole (PROTONIX) 40 MG tablet take 1 tablet by mouth once daily 90 tablet 3  . potassium chloride (K-DUR,KLOR-CON) 10 MEQ tablet Take 1 tablet (10 mEq total) by mouth daily. 90 tablet 1  . Tamsulosin HCl (FLOMAX) 0.4 MG CAPS Take 0.4 mg by mouth at bedtime.     . traZODone (DESYREL) 50 MG tablet take 1/2  TO ONE tablet by mouth at bedtime if needed for sleep 90 tablet 1  . warfarin (COUMADIN) 4 MG tablet Take as directed by anticoagulation clinic 105 tablet 3   No current facility-administered medications for this visit.    Allergies:   Metoprolol    Social History:  The patient  reports that he quit smoking about 40 years ago. His smoking use included Cigarettes. He smoked 1.00 pack per day. He has never used smokeless tobacco. He reports that he drinks about 7.0 oz of alcohol per week. He reports that he does not use illicit drugs.   Family History:  The patient's family history includes Cancer in his father; Heart attack in his mother; Heart disease in his mother; Hypertension in his mother; Prostate cancer (age of onset: 25) in his father; Stroke in his father.     ROS:  Please see the history of present illness.   Otherwise, review of systems are positive for none.   All other systems are reviewed and negative.    PHYSICAL EXAM: VS:  There were no vitals taken for this visit. , BMI There is no weight on file to calculate BMI. GEN: Well nourished, well developed, in no acute distress HEENT: normal Neck: no JVD, carotid bruits, or masses Cardiac: iRRR; 2/6 murmurs,  rubs, or gallops,no edema  Respiratory:  clear to auscultation bilaterally, normal work of breathing GI: soft, nontender, nondistended, + BS MS: no deformity or atrophy Skin: warm and dry, no rash Neuro:  Strength and sensation are intact Psych: euthymic mood, full affect   EKG:  EKG is ordered today. The ekg ordered today demonstrates A-fib, 76/minute, LAFB. Anterior MI, age undetermined   Recent Labs: 04/20/2014: Hemoglobin 9.9*; Platelets 260.0 10/19/2014: ALT 13; BUN 14; Creatinine, Ser 0.84; Potassium 4.1; Sodium 138    Lipid Panel    Component Value Date/Time   CHOL 106 10/19/2014 1107   TRIG 94.0 10/19/2014 1107   HDL 41.60 10/19/2014 1107   CHOLHDL 3 10/19/2014 1107   VLDL 18.8 10/19/2014 1107   LDLCALC 46 10/19/2014 1107   Wt Readings from Last 3 Encounters:  11/29/14 224 lb (101.606 kg)  11/22/14 223 lb (101.152 kg)  10/19/14 221 lb (100.245 kg)    TTE: 07/2013  Study Conclusions  - Left ventricle: The cavity size was normal. Wall thickness was normal. Systolic function was normal. The estimated ejection fraction was in the range of 55% to 60%. Wall motion was normal; there were no regional wall motion abnormalities. The study is not technically sufficient to allow evaluation of LV diastolic function. Doppler parameters are consistent with elevated mean left atrial filling pressure. - Aortic valve: There was mild stenosis. Valve area (VTI): 1.25 cm^2. Valve area (Vmax): 1.28 cm^2. - Left atrium: The atrium was mildly dilated. - Right  ventricle: The cavity size was moderately dilated. Systolic function was moderately reduced. - Right atrium: The atrium was severely dilated. - Tricuspid valve: There was moderate regurgitation directed centrally. - Pulmonary arteries: Systolic pressure was moderately to severely increased. PA peak pressure: 62 mm Hg (S).  Impressions: - Estimated sPAP has increased since 2014.    ASSESSMENT AND PLAN:  1. Aortic stenosis - mild in 07/2013, S2 present, we will recheck if new symptoms.   2. Ascending aortic dissection - s/p emergent ascending aorta replacement with a graft in 2014. He had supra-coronary attachment of this graft.  3. Chronic a-fib - rate controlled, on chronic anticoagulation with warfarin  4. Chronic diastolic CHF - he is euvolemic.   5. Severe pulmonary hypertension - stable, continue amlodipine.  Follow up in 6 months.  Signed, Dorothy Spark, MD  04/06/2015 1:29 PM    [facility] [address], [place], [place]  [address] Phone: [phone]; Fax: [phone]

## 2015-04-10 ENCOUNTER — Other Ambulatory Visit: Payer: Medicare Other

## 2015-04-10 ENCOUNTER — Encounter: Payer: Self-pay | Admitting: Internal Medicine

## 2015-04-10 ENCOUNTER — Ambulatory Visit (INDEPENDENT_AMBULATORY_CARE_PROVIDER_SITE_OTHER): Payer: Medicare Other | Admitting: Internal Medicine

## 2015-04-10 ENCOUNTER — Other Ambulatory Visit (INDEPENDENT_AMBULATORY_CARE_PROVIDER_SITE_OTHER): Payer: Medicare Other

## 2015-04-10 VITALS — BP 132/78 | HR 80 | Temp 97.8°F | Resp 20 | Ht 67.0 in | Wt 236.0 lb

## 2015-04-10 DIAGNOSIS — I5032 Chronic diastolic (congestive) heart failure: Secondary | ICD-10-CM

## 2015-04-10 DIAGNOSIS — E1140 Type 2 diabetes mellitus with diabetic neuropathy, unspecified: Secondary | ICD-10-CM | POA: Diagnosis not present

## 2015-04-10 DIAGNOSIS — E785 Hyperlipidemia, unspecified: Secondary | ICD-10-CM

## 2015-04-10 DIAGNOSIS — I10 Essential (primary) hypertension: Secondary | ICD-10-CM

## 2015-04-10 LAB — BASIC METABOLIC PANEL
BUN: 18 mg/dL (ref 6–23)
CHLORIDE: 102 meq/L (ref 96–112)
CO2: 34 meq/L — AB (ref 19–32)
Calcium: 10 mg/dL (ref 8.4–10.5)
Creatinine, Ser: 0.82 mg/dL (ref 0.40–1.50)
GFR: 95.43 mL/min (ref >60.00)
GLUCOSE: 125 mg/dL — AB (ref 70–99)
POTASSIUM: 4.3 meq/L (ref 3.5–5.1)
SODIUM: 140 meq/L (ref 135–145)

## 2015-04-10 LAB — URINALYSIS, ROUTINE W REFLEX MICROSCOPIC
BILIRUBIN URINE: NEGATIVE
HGB URINE DIPSTICK: NEGATIVE
Ketones, ur: NEGATIVE
Leukocytes, UA: NEGATIVE
NITRITE: NEGATIVE
RBC / HPF: NONE SEEN (ref 0–?)
Specific Gravity, Urine: 1.015 (ref 1.000–1.030)
TOTAL PROTEIN, URINE-UPE24: NEGATIVE
URINE GLUCOSE: NEGATIVE
Urobilinogen, UA: 0.2 (ref 0.0–1.0)
pH: 6.5 (ref 5.0–8.0)

## 2015-04-10 LAB — LIPID PANEL
Cholesterol: 102 mg/dL (ref 0–200)
HDL: 45.6 mg/dL (ref >39.00)
LDL Cholesterol: 45 mg/dL (ref 0–99)
NonHDL: 56.71
TRIGLYCERIDES: 57 mg/dL (ref 0.0–149.0)
Total CHOL/HDL Ratio: 2
VLDL: 11.4 mg/dL (ref 0.0–40.0)

## 2015-04-10 LAB — CBC WITH DIFFERENTIAL/PLATELET
BASOS ABS: 0 10*3/uL (ref 0.0–0.1)
Basophils Relative: 0.3 % (ref 0.0–3.0)
EOS ABS: 0.2 10*3/uL (ref 0.0–0.7)
EOS PCT: 2.7 % (ref 0.0–5.0)
HEMATOCRIT: 35.1 % — AB (ref 39.0–52.0)
HEMOGLOBIN: 11.1 g/dL — AB (ref 13.0–17.0)
LYMPHS ABS: 1 10*3/uL (ref 0.7–4.0)
Lymphocytes Relative: 11.9 % — ABNORMAL LOW (ref 12.0–46.0)
MCHC: 31.5 g/dL (ref 30.0–36.0)
MCV: 90.1 fl (ref 78.0–100.0)
MONO ABS: 0.8 10*3/uL (ref 0.1–1.0)
Monocytes Relative: 9.8 % (ref 3.0–12.0)
Neutro Abs: 6.5 10*3/uL (ref 1.4–7.7)
Neutrophils Relative %: 75.3 % (ref 43.0–77.0)
PLATELETS: 232 10*3/uL (ref 150.0–400.0)
RBC: 3.89 Mil/uL — ABNORMAL LOW (ref 4.22–5.81)
RDW: 16.1 % — AB (ref 11.5–15.5)
WBC: 8.6 10*3/uL (ref 4.0–10.5)

## 2015-04-10 LAB — MICROALBUMIN / CREATININE URINE RATIO
CREATININE, U: 133.4 mg/dL
MICROALB UR: 4.1 mg/dL — AB (ref 0.0–1.9)
MICROALB/CREAT RATIO: 3.1 mg/g (ref 0.0–30.0)

## 2015-04-10 LAB — TSH: TSH: 1.48 u[IU]/mL (ref 0.35–4.50)

## 2015-04-10 LAB — HEPATIC FUNCTION PANEL
ALBUMIN: 4.3 g/dL (ref 3.5–5.2)
ALK PHOS: 66 U/L (ref 39–117)
ALT: 11 U/L (ref 0–53)
AST: 18 U/L (ref 0–37)
Bilirubin, Direct: 0.2 mg/dL (ref 0.0–0.3)
TOTAL PROTEIN: 7.8 g/dL (ref 6.0–8.3)
Total Bilirubin: 0.8 mg/dL (ref 0.2–1.2)

## 2015-04-10 LAB — HEMOGLOBIN A1C: Hgb A1c MFr Bld: 6.6 % — ABNORMAL HIGH (ref 4.6–6.5)

## 2015-04-10 NOTE — Patient Instructions (Signed)

## 2015-04-10 NOTE — Progress Notes (Signed)
Pre visit review using our clinic review tool, if applicable. No additional management support is needed unless otherwise documented below in the visit note. 

## 2015-04-10 NOTE — Progress Notes (Signed)
Subjective:    Patient ID: Eric Rivers, male    DOB: 02-05-1931, 82 y.o.   MRN: [id number]  HPI   Here for yearly f/u;  Overall doing ok;  Pt denies Chest pain, worsening SOB, DOE, wheezing, orthopnea, PND, worsening LE edema, palpitations, dizziness or syncope.  Pt denies neurological change such as new headache, facial or extremity weakness.  Pt denies polydipsia, polyuria, or low sugar symptoms. Pt states overall good compliance with treatment and medications, good tolerability, and has been trying to follow appropriate diet.  Pt denies worsening depressive symptoms, suicidal ideation or panic. No fever, night sweats, wt loss, loss of appetite, or other constitutional symptoms.  Pt states good ability with ADL's, has low fall risk, home safety reviewed and adequate, no other significant changes in hearing or vision, and only occasionally active with exercise.  Has gained several lbs with ice cream more lately  Only uses cane on cruise ships for now.  But states balance is more of an issue recently and slows him down.  Not dizzy, no falls. Wt Readings from Last 3 Encounters:  04/10/15 236 lb (107.049 kg)  04/06/15 236 lb (107.049 kg)  11/29/14 224 lb (101.606 kg)   Past Medical History  Diagnosis Date  . Whooping cough   . Atrial fibrillation (HCC)      Chronic,   24 hour holter, September, 2011.... atrial fib rate is controlled.... there is some bradycardia but  no marked pauses  . Diabetes mellitus     type 2  . Hypertension   . DDD (degenerative disc disease)     lumbar spine  . GERD (gastroesophageal reflux disease)   . Gout   . Fluid overload   . ACE-inhibitor cough   . Shortness of breath     on exertion  . Aortic stenosis     mild....echo... september... 2010/ mild... echo.[name] KitchenMarland KitchenDec, 2011  . Pulmonary hypertension ([place])     6mmHg, echo, 01/2010  . Hyperlipidemia   . Hx of colonoscopy     approx. 10 years  . Warfarin anticoagulation     Atrial fib  . Ejection fraction      EF 60%, echo, 01/2010  . Normal nuclear stress test     06/2005 , also ABI normal 2007  . Hypercholesterolemia   . Fracture of metacarpal bone 11/30/11  . Aortic dissection [facility])     Surgical repair February, 2014  . Mitral regurgitation   . Diabetes mellitus with neuropathy ([place]) 12/04/2006    Qualifier: Diagnosis of  By: Linda Hedges MD, Heinz Knuckles  medications - DPP4   . Bladder cancer [facility])     recurrence with TUR-B March '09  . Prostate cancer ([place])     radiation tx.  . Diverticulosis of colon with hemorrhage 01/01/2014   Past Surgical History  Procedure Laterality Date  . Lumbar laminectomy      '02  . Tur-bt '06, '09    . Bladder cancer biopsy  10/16/2010    negative for malignancy  . Bladder microscopic  04/13/2007    high grade papillary urothelial lesions  . 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 Prostate biopsy  09/25/2011    GLEASON 3+3=6 AND 4+3=7  . Ascending aortic root replacement N/A 04/15/2012    Procedure: ASCENDING AORTIC ROOT REPLACEMENT;  Surgeon: Gaye Pollack, MD;  Location: [facility];  Service: Open Heart Surgery;  Laterality: N/A;  . Tee without cardioversion N/A  04/15/2012    Procedure: TRANSESOPHAGEAL ECHOCARDIOGRAM (TEE);  Surgeon: Gaye Pollack, MD;  Location: [facility];  Service: Open Heart Surgery;  Laterality: N/A;  . Exploration post operative open heart  04/2012  . Cystoscopy/retrograde/ureteroscopy Bilateral 04/04/2013    Procedure: CYSTOSCOPY WITH RETROGRADE PYELOGRAM AND BLADDER BIOPSY;  Surgeon: Molli Hazard, MD;  [facility];  Service: Urology;  Laterality: Bilateral;  . Herniatic disc surgery    . Colonoscopy N/A 01/01/2014    Procedure: COLONOSCOPY;  Surgeon: Gatha Mayer, MD;  [facility];  Service: Endoscopy;  Laterality: N/A;    reports that he quit smoking about 40 years ago. His smoking use included Cigarettes. He smoked 1.00 pack per day. He has never used smokeless tobacco. He reports  that he drinks about 7.0 oz of alcohol per week. He reports that he does not use illicit drugs. family history includes Cancer in his father; Heart attack in his mother; Heart disease in his mother; Hypertension in his mother; Prostate cancer (age of onset: 34) in his father; Stroke in his father. Allergies  Allergen Reactions  . Metoprolol Other (See Comments)    Marked bradycardia at 25 mg bid    Review of Systems Constitutional: Negative for increased diaphoresis, other activity, appetite or siginficant weight change other than noted HENT: Negative for worsening hearing loss, ear pain, facial swelling, mouth sores and neck stiffness.   Eyes: Negative for other worsening pain, redness or visual disturbance.  Respiratory: Negative for shortness of breath and wheezing  Cardiovascular: Negative for chest pain and palpitations.  Gastrointestinal: Negative for diarrhea, blood in stool, abdominal distention or other pain Genitourinary: Negative for hematuria, flank pain or change in urine volume.  Musculoskeletal: Negative for myalgias or other joint complaints.  Skin: Negative for color change and wound or drainage.  Neurological: Negative for syncope and numbness. other than noted Hematological: Negative for adenopathy. or other swelling Psychiatric/Behavioral: Negative for hallucinations, SI, self-injury, decreased concentration or other worsening agitation.      Objective:   Physical Exam BP 132/78 mmHg  Pulse 80  Temp(Src) 97.8 F (36.6 C) (Oral)  Resp 20  Ht 5\' 7"  (1.702 m)  Wt 236 lb (107.049 kg)  BMI 36.95 kg/m2  SpO2 93% VS noted, obese Constitutional: Pt is oriented to person, place, and time. Appears well-developed and well-nourished, in no significant distress Head: Normocephalic and atraumatic.  Right Ear: External ear normal.  Left Ear: External ear normal.  Nose: Nose normal.  Mouth/Throat: Oropharynx is clear and moist.  Eyes: Conjunctivae and EOM are normal.  Pupils are equal, round, and reactive to light.  Neck: Normal range of motion. Neck supple. No JVD present. No tracheal deviation present or significant neck LA or mass Cardiovascular: Normal rate, regular rhythm, normal heart sounds and intact distal pulses.   Pulmonary/Chest: Effort normal and breath sounds without rales or wheezing  Abdominal: Soft. Bowel sounds are normal. NT. No HSM  Musculoskeletal: Normal range of motion. Exhibits no edema.  Lymphadenopathy:  Has no cervical adenopathy.  Neurological: Pt is alert and oriented to person, place, and time. Pt has normal reflexes. No cranial nerve deficit. Motor grossly intact Skin: Skin is warm and dry. No rash noted.  Psychiatric:  Has somewhat irritable mood and affect. Behavior is normal.      Assessment & Plan:

## 2015-04-11 ENCOUNTER — Ambulatory Visit (INDEPENDENT_AMBULATORY_CARE_PROVIDER_SITE_OTHER): Payer: Medicare Other | Admitting: General Practice

## 2015-04-11 ENCOUNTER — Encounter: Payer: Self-pay | Admitting: Internal Medicine

## 2015-04-11 DIAGNOSIS — Z5181 Encounter for therapeutic drug level monitoring: Secondary | ICD-10-CM | POA: Diagnosis not present

## 2015-04-11 DIAGNOSIS — I482 Chronic atrial fibrillation, unspecified: Secondary | ICD-10-CM

## 2015-04-11 DIAGNOSIS — I272 Other secondary pulmonary hypertension: Secondary | ICD-10-CM | POA: Diagnosis not present

## 2015-04-11 DIAGNOSIS — Z7901 Long term (current) use of anticoagulants: Secondary | ICD-10-CM

## 2015-04-11 LAB — POCT INR: INR: 2.5

## 2015-04-11 NOTE — Progress Notes (Signed)
I agree with this plan.

## 2015-04-11 NOTE — Progress Notes (Signed)
Pre visit review using our clinic review tool, if applicable. No additional management support is needed unless otherwise documented below in the visit note. 

## 2015-04-15 NOTE — Assessment & Plan Note (Signed)
stable overall by history and exam, recent data reviewed with pt, and pt to continue medical treatment as before,  to f/u any worsening symptoms or concerns Lab Results  Component Value Date   WBC 8.6 04/10/2015   HGB 11.1* 04/10/2015   HCT 35.1* 04/10/2015   PLT 232.0 04/10/2015   GLUCOSE 125* 04/10/2015   CHOL 102 04/10/2015   TRIG 57.0 04/10/2015   HDL 45.60 04/10/2015   LDLCALC 45 04/10/2015   ALT 11 04/10/2015   AST 18 04/10/2015   NA 140 04/10/2015   K 4.3 04/10/2015   CL 102 04/10/2015   CREATININE 0.82 04/10/2015   BUN 18 04/10/2015   CO2 34* 04/10/2015   TSH 1.48 04/10/2015   PSA 2.19 12/06/2007   INR 2.5 04/11/2015   HGBA1C 6.6* 04/10/2015   MICROALBUR 4.1* 04/10/2015

## 2015-04-15 NOTE — Assessment & Plan Note (Signed)
stable overall by history and exam, recent data reviewed with pt, and pt to continue medical treatment as before,  to f/u any worsening symptoms or concerns Lab Results  Component Value Date   HGBA1C 6.6* 04/10/2015

## 2015-04-15 NOTE — Assessment & Plan Note (Signed)
stable overall by history and exam, recent data reviewed with pt, and pt to continue medical treatment as before,  to f/u any worsening symptoms or concerns Lab Results  Component Value Date   CHOL 102 04/10/2015   HDL 45.60 04/10/2015   LDLCALC 45 04/10/2015   TRIG 57.0 04/10/2015   CHOLHDL 2 04/10/2015

## 2015-04-15 NOTE — Assessment & Plan Note (Signed)
stable overall by history and exam, recent data reviewed with pt, and pt to continue medical treatment as before,  to f/u any worsening symptoms or concerns BP Readings from Last 3 Encounters:  04/10/15 132/78  04/06/15 138/80  11/29/14 179/80

## 2015-05-08 DIAGNOSIS — C61 Malignant neoplasm of prostate: Secondary | ICD-10-CM | POA: Diagnosis not present

## 2015-05-08 DIAGNOSIS — Z0000 Encounter for general adult medical examination without abnormal findings: Secondary | ICD-10-CM | POA: Diagnosis not present

## 2015-05-08 DIAGNOSIS — C670 Malignant neoplasm of trigone of bladder: Secondary | ICD-10-CM | POA: Diagnosis not present

## 2015-05-08 DIAGNOSIS — R310 Gross hematuria: Secondary | ICD-10-CM | POA: Diagnosis not present

## 2015-05-14 ENCOUNTER — Other Ambulatory Visit: Payer: Self-pay | Admitting: General Practice

## 2015-05-14 MED ORDER — WARFARIN SODIUM 4 MG PO TABS: ORAL_TABLET | ORAL | Status: DC

## 2015-05-17 DIAGNOSIS — I739 Peripheral vascular disease, unspecified: Secondary | ICD-10-CM | POA: Diagnosis not present

## 2015-05-17 DIAGNOSIS — E1151 Type 2 diabetes mellitus with diabetic peripheral angiopathy without gangrene: Secondary | ICD-10-CM | POA: Diagnosis not present

## 2015-05-17 DIAGNOSIS — L603 Nail dystrophy: Secondary | ICD-10-CM | POA: Diagnosis not present

## 2015-05-23 ENCOUNTER — Ambulatory Visit (INDEPENDENT_AMBULATORY_CARE_PROVIDER_SITE_OTHER): Payer: Medicare Other | Admitting: General Practice

## 2015-05-23 DIAGNOSIS — I482 Chronic atrial fibrillation, unspecified: Secondary | ICD-10-CM

## 2015-05-23 DIAGNOSIS — I272 Other secondary pulmonary hypertension: Secondary | ICD-10-CM | POA: Diagnosis not present

## 2015-05-23 DIAGNOSIS — Z5181 Encounter for therapeutic drug level monitoring: Secondary | ICD-10-CM | POA: Diagnosis not present

## 2015-05-23 LAB — POCT INR: INR: 2.3

## 2015-05-23 NOTE — Progress Notes (Signed)
Pre visit review using our clinic review tool, if applicable. No additional management support is needed unless otherwise documented below in the visit note. 

## 2015-05-23 NOTE — Progress Notes (Signed)
I agree with this plan.

## 2015-05-30 DIAGNOSIS — R310 Gross hematuria: Secondary | ICD-10-CM | POA: Diagnosis not present

## 2015-05-30 DIAGNOSIS — Z0000 Encounter for general adult medical examination without abnormal findings: Secondary | ICD-10-CM | POA: Diagnosis not present

## 2015-05-30 DIAGNOSIS — R39198 Other difficulties with micturition: Secondary | ICD-10-CM | POA: Diagnosis not present

## 2015-05-30 DIAGNOSIS — R338 Other retention of urine: Secondary | ICD-10-CM | POA: Diagnosis not present

## 2015-05-30 DIAGNOSIS — C61 Malignant neoplasm of prostate: Secondary | ICD-10-CM | POA: Diagnosis not present

## 2015-06-01 ENCOUNTER — Encounter (HOSPITAL_COMMUNITY): Payer: Self-pay | Admitting: Emergency Medicine

## 2015-06-01 ENCOUNTER — Telehealth: Payer: Self-pay | Admitting: Cardiology

## 2015-06-01 ENCOUNTER — Inpatient Hospital Stay (HOSPITAL_COMMUNITY)
Admission: EM | Admit: 2015-06-01 | Discharge: 2015-06-03 | DRG: 699 | Disposition: A | Discharge status: Home / Self Care | Payer: Medicare Other | Attending: Internal Medicine | Admitting: Internal Medicine

## 2015-06-01 ENCOUNTER — Observation Stay (HOSPITAL_COMMUNITY): Payer: Medicare Other

## 2015-06-01 DIAGNOSIS — Z923 Personal history of irradiation: Secondary | ICD-10-CM

## 2015-06-01 DIAGNOSIS — D649 Anemia, unspecified: Secondary | ICD-10-CM | POA: Diagnosis present

## 2015-06-01 DIAGNOSIS — D62 Acute posthemorrhagic anemia: Secondary | ICD-10-CM | POA: Diagnosis not present

## 2015-06-01 DIAGNOSIS — Z8546 Personal history of malignant neoplasm of prostate: Secondary | ICD-10-CM

## 2015-06-01 DIAGNOSIS — Z8249 Family history of ischemic heart disease and other diseases of the circulatory system: Secondary | ICD-10-CM

## 2015-06-01 DIAGNOSIS — N179 Acute kidney failure, unspecified: Secondary | ICD-10-CM | POA: Diagnosis present

## 2015-06-01 DIAGNOSIS — Z8042 Family history of malignant neoplasm of prostate: Secondary | ICD-10-CM

## 2015-06-01 DIAGNOSIS — N3041 Irradiation cystitis with hematuria: Principal | ICD-10-CM | POA: Diagnosis present

## 2015-06-01 DIAGNOSIS — E1140 Type 2 diabetes mellitus with diabetic neuropathy, unspecified: Secondary | ICD-10-CM | POA: Diagnosis not present

## 2015-06-01 DIAGNOSIS — K219 Gastro-esophageal reflux disease without esophagitis: Secondary | ICD-10-CM | POA: Diagnosis present

## 2015-06-01 DIAGNOSIS — R350 Frequency of micturition: Secondary | ICD-10-CM | POA: Diagnosis present

## 2015-06-01 DIAGNOSIS — I482 Chronic atrial fibrillation, unspecified: Secondary | ICD-10-CM | POA: Diagnosis present

## 2015-06-01 DIAGNOSIS — R0602 Shortness of breath: Secondary | ICD-10-CM

## 2015-06-01 DIAGNOSIS — Y842 Radiological procedure and radiotherapy as the cause of abnormal reaction of the patient, or of later complication, without mention of misadventure at the time of the procedure: Secondary | ICD-10-CM | POA: Diagnosis present

## 2015-06-01 DIAGNOSIS — R319 Hematuria, unspecified: Secondary | ICD-10-CM | POA: Diagnosis not present

## 2015-06-01 DIAGNOSIS — N3091 Cystitis, unspecified with hematuria: Secondary | ICD-10-CM | POA: Diagnosis not present

## 2015-06-01 DIAGNOSIS — Z951 Presence of aortocoronary bypass graft: Secondary | ICD-10-CM

## 2015-06-01 DIAGNOSIS — R791 Abnormal coagulation profile: Secondary | ICD-10-CM | POA: Diagnosis present

## 2015-06-01 DIAGNOSIS — N3090 Cystitis, unspecified without hematuria: Secondary | ICD-10-CM

## 2015-06-01 DIAGNOSIS — I5032 Chronic diastolic (congestive) heart failure: Secondary | ICD-10-CM | POA: Diagnosis present

## 2015-06-01 DIAGNOSIS — N3001 Acute cystitis with hematuria: Secondary | ICD-10-CM | POA: Diagnosis not present

## 2015-06-01 DIAGNOSIS — N400 Enlarged prostate without lower urinary tract symptoms: Secondary | ICD-10-CM | POA: Diagnosis present

## 2015-06-01 DIAGNOSIS — Z87891 Personal history of nicotine dependence: Secondary | ICD-10-CM

## 2015-06-01 DIAGNOSIS — I110 Hypertensive heart disease with heart failure: Secondary | ICD-10-CM | POA: Diagnosis not present

## 2015-06-01 DIAGNOSIS — Z9079 Acquired absence of other genital organ(s): Secondary | ICD-10-CM

## 2015-06-01 DIAGNOSIS — E785 Hyperlipidemia, unspecified: Secondary | ICD-10-CM | POA: Diagnosis present

## 2015-06-01 DIAGNOSIS — Z8551 Personal history of malignant neoplasm of bladder: Secondary | ICD-10-CM

## 2015-06-01 LAB — CBC WITH DIFFERENTIAL/PLATELET
Basophils Absolute: 0 10*3/uL (ref 0.0–0.1)
Basophils Relative: 0 %
Eosinophils Absolute: 0.1 10*3/uL (ref 0.0–0.7)
Eosinophils Relative: 1 %
HCT: 25.8 % — ABNORMAL LOW (ref 39.0–52.0)
Hemoglobin: 8.2 g/dL — ABNORMAL LOW (ref 13.0–17.0)
Lymphocytes Relative: 10 %
Lymphs Abs: 1 10*3/uL (ref 0.7–4.0)
MCH: 26.4 pg (ref 26.0–34.0)
MCHC: 31.8 g/dL (ref 30.0–36.0)
MCV: 83 fL (ref 78.0–100.0)
Monocytes Absolute: 1.2 10*3/uL — ABNORMAL HIGH (ref 0.1–1.0)
Monocytes Relative: 12 %
Neutro Abs: 8.1 10*3/uL — ABNORMAL HIGH (ref 1.7–7.7)
Neutrophils Relative %: 77 %
Platelets: 283 10*3/uL (ref 150–400)
RBC: 3.11 MIL/uL — ABNORMAL LOW (ref 4.22–5.81)
RDW: 16.7 % — ABNORMAL HIGH (ref 11.5–15.5)
WBC: 10.4 10*3/uL (ref 4.0–10.5)

## 2015-06-01 LAB — URINALYSIS, ROUTINE W REFLEX MICROSCOPIC
Glucose, UA: NEGATIVE mg/dL
Ketones, ur: 40 mg/dL — AB
Nitrite: POSITIVE — AB
Protein, ur: 30 mg/dL — AB
Specific Gravity, Urine: 1.02 (ref 1.005–1.030)
pH: 6 (ref 5.0–8.0)

## 2015-06-01 LAB — COMPREHENSIVE METABOLIC PANEL
ALT: 15 U/L — ABNORMAL LOW (ref 17–63)
AST: 35 U/L (ref 15–41)
Albumin: 3.6 g/dL (ref 3.5–5.0)
Alkaline Phosphatase: 51 U/L (ref 38–126)
Anion gap: 11 (ref 5–15)
BUN: 20 mg/dL (ref 6–20)
CO2: 23 mmol/L (ref 22–32)
Calcium: 8.8 mg/dL — ABNORMAL LOW (ref 8.9–10.3)
Chloride: 104 mmol/L (ref 101–111)
Creatinine, Ser: 1.26 mg/dL — ABNORMAL HIGH (ref 0.61–1.24)
GFR calc Af Amer: 60 mL/min — ABNORMAL LOW (ref >60)
GFR calc non Af Amer: 51 mL/min — ABNORMAL LOW (ref >60)
Glucose, Bld: 122 mg/dL — ABNORMAL HIGH (ref 65–99)
Potassium: 3.8 mmol/L (ref 3.5–5.1)
Sodium: 138 mmol/L (ref 135–145)
Total Bilirubin: 0.8 mg/dL (ref 0.3–1.2)
Total Protein: 6.8 g/dL (ref 6.5–8.1)

## 2015-06-01 LAB — URINE MICROSCOPIC-ADD ON

## 2015-06-01 LAB — PROTIME-INR
INR: 3.44 — ABNORMAL HIGH (ref 0.00–1.49)
Prothrombin Time: 33.9 seconds — ABNORMAL HIGH (ref 11.6–15.2)

## 2015-06-01 MED ORDER — TAMSULOSIN HCL 0.4 MG PO CAPS
0.4000 mg | ORAL_CAPSULE | Freq: Every day | ORAL | Status: DC
  Administered 2015-06-02 (×2): 0.4 mg via ORAL
  Filled 2015-06-01 (×2): qty 1

## 2015-06-01 MED ORDER — DEXTROSE 5 % IV SOLN
1.0000 g | INTRAVENOUS | Status: DC
  Administered 2015-06-02 (×2): 1 g via INTRAVENOUS
  Filled 2015-06-01 (×2): qty 10

## 2015-06-01 MED ORDER — TRAZODONE HCL 50 MG PO TABS
50.0000 mg | ORAL_TABLET | Freq: Every evening | ORAL | Status: DC | PRN
  Administered 2015-06-02: 50 mg via ORAL
  Filled 2015-06-01: qty 1

## 2015-06-01 MED ORDER — ONDANSETRON HCL 4 MG/2ML IJ SOLN: 4.0000 mg | Freq: Four times a day (QID) | INTRAMUSCULAR | Status: DC | PRN

## 2015-06-01 MED ORDER — VITAMIN K1 10 MG/ML IJ SOLN
10.0000 mg | Freq: Once | INTRAVENOUS | Status: AC
  Administered 2015-06-01: 10 mg via INTRAVENOUS
  Filled 2015-06-01: qty 1

## 2015-06-01 MED ORDER — ALBUTEROL SULFATE (2.5 MG/3ML) 0.083% IN NEBU: 2.5000 mg | INHALATION_SOLUTION | RESPIRATORY_TRACT | Status: DC | PRN

## 2015-06-01 MED ORDER — INSULIN ASPART 100 UNIT/ML ~~LOC~~ SOLN
0.0000 [IU] | Freq: Three times a day (TID) | SUBCUTANEOUS | Status: DC
  Administered 2015-06-02: 1 [IU] via SUBCUTANEOUS

## 2015-06-01 MED ORDER — SODIUM CHLORIDE 0.9 % IV SOLN
Freq: Once | INTRAVENOUS | Status: AC
  Administered 2015-06-02: via INTRAVENOUS

## 2015-06-01 MED ORDER — MORPHINE SULFATE (PF) 2 MG/ML IV SOLN: 1.0000 mg | INTRAVENOUS | Status: DC | PRN

## 2015-06-01 MED ORDER — ACETAMINOPHEN 650 MG RE SUPP: 650.0000 mg | Freq: Four times a day (QID) | RECTAL | Status: DC | PRN

## 2015-06-01 MED ORDER — ATORVASTATIN CALCIUM 20 MG PO TABS
20.0000 mg | ORAL_TABLET | Freq: Every day | ORAL | Status: DC
  Administered 2015-06-02: 20 mg via ORAL
  Filled 2015-06-01 (×2): qty 1

## 2015-06-01 MED ORDER — PANTOPRAZOLE SODIUM 40 MG PO TBEC
40.0000 mg | DELAYED_RELEASE_TABLET | Freq: Every day | ORAL | Status: DC
  Administered 2015-06-02 – 2015-06-03 (×2): 40 mg via ORAL
  Filled 2015-06-01 (×2): qty 1

## 2015-06-01 MED ORDER — ACETAMINOPHEN 325 MG PO TABS: 650.0000 mg | ORAL_TABLET | Freq: Four times a day (QID) | ORAL | Status: DC | PRN

## 2015-06-01 MED ORDER — SENNA 8.6 MG PO TABS: 1.0000 | ORAL_TABLET | Freq: Every evening | ORAL | Status: DC | PRN

## 2015-06-01 MED ORDER — INSULIN ASPART 100 UNIT/ML ~~LOC~~ SOLN: 0.0000 [IU] | Freq: Every day | SUBCUTANEOUS | Status: DC

## 2015-06-01 MED ORDER — SODIUM CHLORIDE 0.9% FLUSH
3.0000 mL | Freq: Two times a day (BID) | INTRAVENOUS | Status: DC
  Administered 2015-06-02: 3 mL via INTRAVENOUS

## 2015-06-01 MED ORDER — ALLOPURINOL 300 MG PO TABS
300.0000 mg | ORAL_TABLET | Freq: Every day | ORAL | Status: DC
  Administered 2015-06-02 – 2015-06-03 (×2): 300 mg via ORAL
  Filled 2015-06-01 (×3): qty 1

## 2015-06-01 MED ORDER — AMLODIPINE BESYLATE 2.5 MG PO TABS
2.5000 mg | ORAL_TABLET | Freq: Every day | ORAL | Status: DC
  Administered 2015-06-02 – 2015-06-03 (×2): 2.5 mg via ORAL
  Filled 2015-06-01 (×2): qty 1

## 2015-06-01 MED ORDER — FINASTERIDE 5 MG PO TABS
5.0000 mg | ORAL_TABLET | Freq: Every morning | ORAL | Status: DC
  Administered 2015-06-02 – 2015-06-03 (×2): 5 mg via ORAL
  Filled 2015-06-01 (×2): qty 1

## 2015-06-01 MED ORDER — ONDANSETRON HCL 4 MG PO TABS: 4.0000 mg | ORAL_TABLET | Freq: Four times a day (QID) | ORAL | Status: DC | PRN

## 2015-06-01 MED ORDER — HYDROCODONE-ACETAMINOPHEN 5-325 MG PO TABS: 1.0000 | ORAL_TABLET | Freq: Four times a day (QID) | ORAL | Status: DC | PRN

## 2015-06-01 NOTE — ED Provider Notes (Signed)
CSN: [id number]     Arrival date & time 06/01/15  1638 History   First MD Initiated Contact with Patient 06/01/15 1956     Chief Complaint  Patient presents with  . Hematuria    HPI   82 year old male presents today with hematuria. Patient reports a significant past medical history of bladder cancer status post resection in 2012, chronic A. fib on Coumadin. Patient reports that his bleeding in March she was on a cruise ship and had an episode of acute hematuria. [facility], had catheter placed, clear urine thereafter. Catheter was removed, patient followed up with Dr. Tresa Moore once he returned stateside. Patient had normal urinalysis at that time, several days later started to develop acute onset of hematuria again. Patient had a catheter placed which subsequently had numerous blood clots that required irrigation at several different visits. Patient was most recently seen 2 days ago with catheter irrigation. Patient was placed on Bactrim at that time. Patient presents today with continued obstruction with leaking blood and urine around the catheter. Patient denies any other signs of bleeding, any abdominal pain, fever, chills, weakness, dizziness, chest pain or shortness of breath. Patient reports his next follow-up evaluation was scheduled for April for CT evaluation, and may office visit.   Past Medical History  Diagnosis Date  . Whooping cough   . Atrial fibrillation (HCC)      Chronic,   24 hour holter, September, 2011.... atrial fib rate is controlled.... there is some bradycardia but  no marked pauses  . Diabetes mellitus     type 2  . Hypertension   . DDD (degenerative disc disease)     lumbar spine  . GERD (gastroesophageal reflux disease)   . Gout   . Fluid overload   . ACE-inhibitor cough   . Shortness of breath     on exertion  . Aortic stenosis     mild....echo... september... 2010/ mild... echo.[name] KitchenMarland KitchenDec, 2011  . Pulmonary hypertension ([place])      60mmHg, echo, 01/2010  . Hyperlipidemia   . Hx of colonoscopy     approx. 10 years  . Warfarin anticoagulation     Atrial fib  . Ejection fraction     EF 60%, echo, 01/2010  . Normal nuclear stress test     06/2005 , also ABI normal 2007  . Hypercholesterolemia   . Fracture of metacarpal bone 11/30/11  . Aortic dissection [facility])     Surgical repair February, 2014  . Mitral regurgitation   . Diabetes mellitus with neuropathy ([place]) 12/04/2006    Qualifier: Diagnosis of  By: Linda Conway Fedora MD, Heinz Knuckles  medications - DPP4   . Bladder cancer [facility])     recurrence with TUR-B March '09  . Prostate cancer ([place])     radiation tx.  . Diverticulosis of colon with hemorrhage 01/01/2014   Past Surgical History  Procedure Laterality Date  . Lumbar laminectomy      '02  . Tur-bt '06, '09    . Bladder cancer biopsy  10/16/2010    negative for malignancy  . Bladder microscopic  04/13/2007    high grade papillary urothelial lesions  . 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 Prostate biopsy  09/25/2011    GLEASON 3+3=6 AND 4+3=7  . Ascending aortic root replacement N/A 04/15/2012    Procedure: ASCENDING AORTIC ROOT REPLACEMENT;  Surgeon: Gaye Pollack, MD;  Location: [place];  Service: Open Heart Surgery;  Laterality: N/A;  . Tee without cardioversion N/A 04/15/2012    Procedure: TRANSESOPHAGEAL ECHOCARDIOGRAM (TEE);  Surgeon: Gaye Pollack, MD;  Location: [place];  Service: Open Heart Surgery;  Laterality: N/A;  . Exploration post operative open heart  04/2012  . Cystoscopy/retrograde/ureteroscopy Bilateral 04/04/2013    Procedure: CYSTOSCOPY WITH RETROGRADE PYELOGRAM AND BLADDER BIOPSY;  Surgeon: Molli Hazard, MD;  Location: [facility];  Service: Urology;  Laterality: Bilateral;  . Herniatic disc surgery    . Colonoscopy N/A 01/01/2014    Procedure: COLONOSCOPY;  Surgeon: Gatha Mayer, MD;  Location: [facility];  Service: Endoscopy;  Laterality: N/A;   Family  History  Problem Relation Age of Onset  . Prostate cancer Father 34    passed with prostate ca  . Cancer Father   . Stroke Father   . Heart disease Mother   . Hypertension Mother   . Heart attack Mother    Social History  Substance Use Topics  . Smoking status: Former Smoker -- 1.00 packs/day    Types: Cigarettes    Quit date: 03/04/1975  . Smokeless tobacco: Never Used  . Alcohol Use: 7.0 oz/week    14 drink(s) per week     Comment: liquor daily    Review of Systems  All other systems reviewed and are negative.   Allergies  Metoprolol  Home Medications   Prior to Admission medications   Medication Sig Start Date End Date Taking? Authorizing Provider  acetaminophen (TYLENOL) 500 MG tablet Take 1,000 mg by mouth 3 (three) times daily as needed for mild pain, moderate pain, fever or headache.    Yes Historical Provider, MD  allopurinol (ZYLOPRIM) 300 MG tablet take 1 tablet by mouth once daily Patient taking differently: Take 300 mg by mouth once daily 01/15/15  Yes Biagio Borg, MD  amLODipine (NORVASC) 2.5 MG tablet Take 1 tablet (2.5 mg total) by mouth daily. 07/17/14  Yes Carlena Bjornstad, MD  atorvastatin (LIPITOR) 20 MG tablet take 1 tablet by mouth every morning Patient taking differently: Take 20 mg by mouth every morning 07/17/14  Yes Biagio Borg, MD  Calcium Carb-Cholecalciferol (CALCIUM 600 + D PO) Take 1 tablet by mouth at bedtime and may repeat dose one time if needed.    Yes Historical Provider, MD  desonide (DESOWEN) 0.05 % lotion Apply 1 application topically daily as needed (for face).   Yes Historical Provider, MD  finasteride (PROSCAR) 5 MG tablet Take 1 tablet (5 mg total) by mouth every morning. 12/23/13  Yes Biagio Borg, MD  furosemide (LASIX) 40 MG tablet Take 40 mg by mouth daily as needed for fluid or edema.    Yes Historical Provider, MD  JANUVIA 100 MG tablet take 1 tablet by mouth once daily Patient taking differently: Take 100 mg by mouth once daily  01/01/15  Yes Biagio Borg, MD  ketoconazole (NIZORAL) 2 % shampoo Apply 1 application topically 4 (four) times a week.   Yes Historical Provider, MD  Multiple Vitamins-Minerals (ONCOVITE PO) Take 1 tablet by mouth daily.    Yes Historical Provider, MD  pantoprazole (PROTONIX) 40 MG tablet take 1 tablet by mouth once daily Patient taking differently: Take 40 mg by mouth once daily 07/17/14  Yes Biagio Borg, MD  potassium chloride (K-DUR,KLOR-CON) 10 MEQ tablet Take 1 tablet (10 mEq total) by mouth daily. 07/14/14  Yes Carlena Bjornstad, MD  senna (SENOKOT) 8.6 MG tablet Take 1-2  tablets by mouth at bedtime as needed for constipation.    Yes Historical Provider, MD  sulfamethoxazole-trimethoprim (BACTRIM DS,SEPTRA DS) 800-160 MG tablet Take 1 tablet by mouth 2 (two) times daily. 05/30/15  Yes Historical Provider, MD  Tamsulosin HCl (FLOMAX) 0.4 MG CAPS Take 0.4 mg by mouth at bedtime.    Yes Historical Provider, MD  traZODone (DESYREL) 50 MG tablet take 1/2  TO ONE tablet by mouth at bedtime if needed for sleep Patient taking differently: TAKE 25 TO 50 MG BY MOUTH AT BEDTIME AS NEEDED FOR SLEEP 03/06/15  Yes Biagio Borg, MD  warfarin (COUMADIN) 4 MG tablet Take as directed by anticoagulation clinic Patient taking differently: Take 4-8 mg by mouth See admin instructions. Take 4 mg by mouth daily except on Thursday take 8 mg 05/14/15  Yes Biagio Borg, MD   BP 127/60 mmHg  Pulse 65  Temp(Src) 97.7 F (36.5 C) (Oral)  Resp 18  Ht 5\' 8"  (1.727 m)  Wt 103.2 kg  BMI 34.60 kg/m2  SpO2 97%   Physical Exam  Constitutional: He is oriented to person, place, and time. He appears well-developed and well-nourished.  HENT:  Head: Normocephalic and atraumatic.  Eyes: Conjunctivae are normal. Pupils are equal, round, and reactive to light. Right eye exhibits no discharge. Left eye exhibits no discharge. No scleral icterus.  Neck: Normal range of motion. No JVD present. No tracheal deviation present.   Pulmonary/Chest: Effort normal. No stridor.  Abdominal: Soft. He exhibits no distension and no mass. There is no tenderness. There is no rebound and no guarding.  Genitourinary:  Foley catheter in place draining blood and urine  Neurological: He is alert and oriented to person, place, and time. Coordination normal.  Psychiatric: He has a normal mood and affect. His behavior is normal. Judgment and thought content normal.  Nursing note and vitals reviewed.   ED Course  Procedures (including critical care time) Labs Review Labs Reviewed  PROTIME-INR - Abnormal; Notable for the following:    Prothrombin Time 33.9 (*)    INR 3.44 (*)    All other components within normal limits  CBC WITH DIFFERENTIAL/PLATELET - Abnormal; Notable for the following:    RBC 3.11 (*)    Hemoglobin 8.2 (*)    HCT 25.8 (*)    RDW 16.7 (*)    Neutro Abs 8.1 (*)    Monocytes Absolute 1.2 (*)    All other components within normal limits  COMPREHENSIVE METABOLIC PANEL - Abnormal; Notable for the following:    Glucose, Bld 122 (*)    Creatinine, Ser 1.26 (*)    Calcium 8.8 (*)    ALT 15 (*)    GFR calc non Af Amer 51 (*)    GFR calc Af Amer 60 (*)    All other components within normal limits  URINALYSIS, ROUTINE W REFLEX MICROSCOPIC (NOT AT [facility]) - Abnormal; Notable for the following:    Color, Urine RED (*)    APPearance TURBID (*)    Hgb urine dipstick LARGE (*)    Bilirubin Urine LARGE (*)    Ketones, ur 40 (*)    Protein, ur 30 (*)    Nitrite POSITIVE (*)    Leukocytes, UA LARGE (*)    All other components within normal limits  URINE MICROSCOPIC-ADD ON - Abnormal; Notable for the following:    Squamous Epithelial / LPF 0-5 (*)    Bacteria, UA MANY (*)    All other components within normal  limits  URINE CULTURE  BASIC METABOLIC PANEL  HEMOGLOBIN AND HEMATOCRIT, BLOOD  HEMOGLOBIN AND HEMATOCRIT, BLOOD  HEMOGLOBIN AND HEMATOCRIT, BLOOD  HEMOGLOBIN AND HEMATOCRIT, BLOOD  PROTIME-INR  APTT   BRAIN NATRIURETIC PEPTIDE  TYPE AND SCREEN  PREPARE RBC (CROSSMATCH)    Imaging Review Dg Chest Port 1 View  06/02/2015  CLINICAL DATA:  Acute onset of intermittent hematuria after Foley catheter placement. Shortness of breath with exertion. Initial encounter. EXAM: PORTABLE CHEST 1 VIEW COMPARISON:  CTA of the chest performed 11/29/2014, and chest radiograph from 07/20/2013 FINDINGS: The lungs are well-aerated. Mild left basilar opacity likely reflects atelectasis. No pleural effusion or pneumothorax is seen. There is no evidence of focal opacification, pleural effusion or pneumothorax. The cardiomediastinal silhouette is enlarged. The patient is status post median sternotomy. No acute osseous abnormalities are seen. Clips are noted overlying the right axilla. IMPRESSION: Mild left basilar airspace opacity likely reflects atelectasis. Cardiomegaly noted. Electronically Signed   By: Garald Balding M.D.   On: 06/02/2015 00:16   I have personally reviewed and evaluated these images and lab results as part of my medical decision-making.   EKG Interpretation None      MDM   Final diagnoses:  Hematuria  Cystitis  Anemia, unspecified anemia type  SOB (shortness of breath)    Labs: Urinalysis, urine culture, PT/INR, CBC, CMP -  large leuk trase, nitrite positive, large hemoglobin, hemoglobin 8.2, creatinine 1.26  Imaging:   Consults: Urology  Therapeutics:  Discharge Meds:   Assessment/Plan: 82 year old male presents today with hematuria. Patient has been having intermittent hematuria since beginning of the month, he has a elevated INR at 3.44. Uncertain etiology of hematuria as patient's urinary analysis appears to be infectious, although this is difficult to analyze. Due to the significant elevation in INR patient was given vitamin K, admitted to the hospital service for inpatient management with urology follow-up tomorrow. I spoke with Dr. Pilar Jarvis of urology reports they will follow up  patient tomorrow for further management. Patient remained stable while here in the ED, no vital sign abnormalities or significant signs of symptomatic anemia.        Okey Regal, PA-C 06/02/15 [place], DO 06/02/15 [id number]

## 2015-06-01 NOTE — Telephone Encounter (Signed)
Mr.  Covault is calling because he was told to suspend his blood thinner , because he is having bleeding in his urine . He takes lasik and his feet are swollen as well today .  Please call   Thanks

## 2015-06-01 NOTE — H&P (Signed)
[facility] History and Physical  Eric Rivers [id number] DOB: 12/15/1930 DOA: 06/01/2015  Referring physician: ED PCP: Cathlean Cower, MD   Chief Complaint:  bloodily urine  HPI:  Eric Rivers is a 82 year old male with past medical history significant for HTN, HLD, bladder cancer s/p resection 2012, prostate cancer s/p radiation, chronic atrial fibrillation on Coumadin, diastolic CHF last EF 0000000 in 07/2013 ; who presents with bloody urine for the last 2 days. Patient notes symptoms originally started at the beginning of this month while he was on a cruise. Seen by the position on the [place] ship at that time given antibiotics,  and a Foley catheter was placed and irrigated. He notes that the Foley catheter stayed in for approximately 2 days and was removed. He completed the five-day course of antibiotics and states his hemoglobin was somewhere around 10 at that time. After the initial incident he was seen by his urologist Dr. Tresa Moore who checked a urinalysis and saw no signs of infection at that time. He denies any subsequent problems until 2 days ago. Notes acute onset of red urine,  then subsequently started passing clots. He called his urologist's office and was seen that day and a Foley catheter was placed and flushed. Also started on Bactrim. He reports having to return yesterday for similar symptoms after the Foley stopped draining. He was again flushed and sent home. He presents today as symptoms were not improving with complaints of decreased urine output and urine being significantly bloody. He notes that Dr. Tresa Moore had scheduled him for a CT scan on April 14. Endorses A little urinary frequency, progressively worsening shortness of breath since the beginning of this month,  and lower extremity edema. He denies any fever, chills, chest pain, diarrhea, vision changes, or syncope.   Upon admission to the emergency department patient is evaluated and flushed. Initial hemoglobin 8.2, WBC  10.4, BUN 20, creatinine 1.26, INR 3.44. Urinalysis positive for many bacteria, nitrate positive, large leukocyte esterase, 6-30 WBCs. Patient was given 10 mg of vitamin K and urology was consulted in the ED recommended admission to [facility] and that they would see the patient in the a.m.  Review of Systems  Constitutional: Positive for malaise/fatigue. Negative for fever and chills.       Decreased po intake  HENT: Negative for hearing loss and tinnitus.   Eyes: Negative for photophobia and pain.  Respiratory: Negative for sputum production and shortness of breath.   Cardiovascular: Positive for leg swelling. Negative for chest pain.  Gastrointestinal: Negative for nausea, vomiting and constipation.  Genitourinary: Positive for frequency and hematuria.  Musculoskeletal: Negative for neck pain.  Skin: Negative for itching and rash.  Neurological: Negative for sensory change and speech change.  Endo/Heme/Allergies: Negative for environmental allergies. Does not bruise/bleed easily.  Psychiatric/Behavioral: Negative for hallucinations and substance abuse.       Past Medical History  Diagnosis Date  . Whooping cough   . Atrial fibrillation (HCC)      Chronic,   24 hour holter, September, 2011.... atrial fib rate is controlled.... there is some bradycardia but  no marked pauses  . Diabetes mellitus     type 2  . Hypertension   . DDD (degenerative disc disease)     lumbar spine  . GERD (gastroesophageal reflux disease)   . Gout   . Fluid overload   . ACE-inhibitor cough   . Shortness of breath     on exertion  . Aortic stenosis  mild....echo... september... 2010/ mild... echo.[name] KitchenMarland KitchenDec, 2011  . Pulmonary hypertension ([place])     24mmHg, echo, 01/2010  . Hyperlipidemia   . Hx of colonoscopy     approx. 10 years  . Warfarin anticoagulation     Atrial fib  . Ejection fraction     EF 60%, echo, 01/2010  . Normal nuclear stress test     06/2005 , also ABI normal 2007  .  Hypercholesterolemia   . Fracture of metacarpal bone 11/30/11  . Aortic dissection [facility])     Surgical repair February, 2014  . Mitral regurgitation   . Diabetes mellitus with neuropathy ([place]) 12/04/2006    Qualifier: Diagnosis of  By: Linda Hedges MD, Heinz Knuckles  medications - DPP4   . Bladder cancer [facility])     recurrence with TUR-B March '09  . Prostate cancer ([place])     radiation tx.  . Diverticulosis of colon with hemorrhage 01/01/2014     Past Surgical History  Procedure Laterality Date  . Lumbar laminectomy      '02  . Tur-bt '06, '09    . Bladder cancer biopsy  10/16/2010    negative for malignancy  . Bladder microscopic  04/13/2007    high grade papillary urothelial lesions  . 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 Prostate biopsy  09/25/2011    GLEASON 3+3=6 AND 4+3=7  . Ascending aortic root replacement N/A 04/15/2012    Procedure: ASCENDING AORTIC ROOT REPLACEMENT;  Surgeon: Gaye Pollack, MD;  Location: [place] OR;  Service: Open Heart Surgery;  Laterality: N/A;  . Tee without cardioversion N/A 04/15/2012    Procedure: TRANSESOPHAGEAL ECHOCARDIOGRAM (TEE);  Surgeon: Gaye Pollack, MD;  Location: [place];  Service: Open Heart Surgery;  Laterality: N/A;  . Exploration post operative open heart  04/2012  . Cystoscopy/retrograde/ureteroscopy Bilateral 04/04/2013    Procedure: CYSTOSCOPY WITH RETROGRADE PYELOGRAM AND BLADDER BIOPSY;  Surgeon: Molli Hazard, MD;  Location: [facility];  Service: Urology;  Laterality: Bilateral;  . Herniatic disc surgery    . Colonoscopy N/A 01/01/2014    Procedure: COLONOSCOPY;  Surgeon: Gatha Mayer, MD;  Location: [facility];  Service: Endoscopy;  Laterality: N/A;      Social History:  reports that he quit smoking about 40 years ago. His smoking use included Cigarettes. He smoked 1.00 pack per day. He has never used smokeless tobacco. He reports that he drinks about 7.0 oz of alcohol per week. He reports that he  does not use illicit drugs. Where does patient live--home    Can patient participate in ADLs? Yes  Allergies  Allergen Reactions  . Metoprolol Other (See Comments)    Marked bradycardia at 25 mg bid    Family History  Problem Relation Age of Onset  . Prostate cancer Father 38    passed with prostate ca  . Cancer Father   . Stroke Father   . Heart disease Mother   . Hypertension Mother   . Heart attack Mother        Prior to Admission medications   Medication Sig Start Date End Date Taking? Authorizing Provider  acetaminophen (TYLENOL) 500 MG tablet Take 1,000 mg by mouth 3 (three) times daily as needed for mild pain, moderate pain, fever or headache.    Yes Historical Provider, MD  allopurinol (ZYLOPRIM) 300 MG tablet take 1 tablet by mouth once daily Patient taking differently: Take 300 mg by mouth once daily 01/15/15  Yes Biagio Borg, MD  amLODipine (NORVASC) 2.5 MG tablet Take 1 tablet (2.5 mg total) by mouth daily. 07/17/14  Yes Carlena Bjornstad, MD  atorvastatin (LIPITOR) 20 MG tablet take 1 tablet by mouth every morning Patient taking differently: Take 20 mg by mouth every morning 07/17/14  Yes Biagio Borg, MD  Calcium Carb-Cholecalciferol (CALCIUM 600 + D PO) Take 1 tablet by mouth at bedtime and may repeat dose one time if needed.    Yes Historical Provider, MD  desonide (DESOWEN) 0.05 % lotion Apply 1 application topically daily as needed (for face).   Yes Historical Provider, MD  finasteride (PROSCAR) 5 MG tablet Take 1 tablet (5 mg total) by mouth every morning. 12/23/13  Yes Biagio Borg, MD  furosemide (LASIX) 40 MG tablet Take 40 mg by mouth daily as needed for fluid or edema.    Yes Historical Provider, MD  JANUVIA 100 MG tablet take 1 tablet by mouth once daily Patient taking differently: Take 100 mg by mouth once daily 01/01/15  Yes Biagio Borg, MD  ketoconazole (NIZORAL) 2 % shampoo Apply 1 application topically 4 (four) times a week.   Yes Historical Provider, MD   Multiple Vitamins-Minerals (ONCOVITE PO) Take 1 tablet by mouth daily.    Yes Historical Provider, MD  pantoprazole (PROTONIX) 40 MG tablet take 1 tablet by mouth once daily Patient taking differently: Take 40 mg by mouth once daily 07/17/14  Yes Biagio Borg, MD  potassium chloride (K-DUR,KLOR-CON) 10 MEQ tablet Take 1 tablet (10 mEq total) by mouth daily. 07/14/14  Yes Carlena Bjornstad, MD  senna (SENOKOT) 8.6 MG tablet Take 1-2 tablets by mouth at bedtime as needed for constipation.    Yes Historical Provider, MD  sulfamethoxazole-trimethoprim (BACTRIM DS,SEPTRA DS) 800-160 MG tablet Take 1 tablet by mouth 2 (two) times daily. 05/30/15  Yes Historical Provider, MD  Tamsulosin HCl (FLOMAX) 0.4 MG CAPS Take 0.4 mg by mouth at bedtime.    Yes Historical Provider, MD  traZODone (DESYREL) 50 MG tablet take 1/2  TO ONE tablet by mouth at bedtime if needed for sleep Patient taking differently: TAKE 25 TO 50 MG BY MOUTH AT BEDTIME AS NEEDED FOR SLEEP 03/06/15  Yes Biagio Borg, MD  warfarin (COUMADIN) 4 MG tablet Take as directed by anticoagulation clinic Patient taking differently: Take 4-8 mg by mouth See admin instructions. Take 4 mg by mouth daily except on Thursday take 8 mg 05/14/15  Yes Biagio Borg, MD     Physical Exam: Filed Vitals:   06/01/15 1648 06/01/15 2013  BP: 139/65 134/68  Pulse: 83 74  Temp: 97.8 F (36.6 C) 98 F (36.7 C)  TempSrc: Oral Oral  Resp: 20 20  SpO2: 94% 96%     Constitutional: Vital signs reviewed. Patient is a well-developed and well-nourished in no acute distress and cooperative with exam. Alert and oriented x3.  Head: Normocephalic and atraumatic  Ear: TM normal bilaterally  Mouth: no erythema or exudates, MMM  Eyes: PERRL, EOMI, conjunctivae normal, No scleral icterus.  Neck: Supple, Trachea midline normal ROM, No JVD, mass, thyromegaly, or carotid bruit present.  Cardiovascular: RRR, S1 normal, S2 normal, no MRG, pulses symmetric and intact bilaterally   Pulmonary/Chest: CTAB, no wheezes, rales, or rhonchi  Abdominal: Soft. Non-tender, non-distended, bowel sounds are normal, no masses, organomegaly, or guarding present.  GU: no CVA tenderness. Dark red urine with clots in Foley bag.  Musculoskeletal: No joint deformities, erythema, or  stiffness, ROM full and no nontender Ext: +1 pitting edema of the lower extremities. No cyanosis, pulses palpable bilaterally (DP and PT)  Hematology: no cervical, inginal, or axillary adenopathy.  Neurological: A&O x3, Strenght is normal and symmetric bilaterally, cranial nerve II-XII are grossly intact, no focal motor deficit, sensory intact to light touch bilaterally.  Skin: Warm, dry and intact. No rash, cyanosis, or clubbing.  Psychiatric: Normal mood and affect. speech and behavior is normal. Judgment and thought content normal. Cognition and memory are normal.      Data Review   Micro Results No results found for this or any previous visit (from the past 240 hour(s)).  Radiology Reports No results found.   CBC  Recent Labs Lab 06/01/15 2115  WBC 10.4  HGB 8.2*  HCT 25.8*  PLT 283  MCV 83.0  MCH 26.4  MCHC 31.8  RDW 16.7*  LYMPHSABS 1.0  MONOABS 1.2*  EOSABS 0.1  BASOSABS 0.0    Chemistries   Recent Labs Lab 06/01/15 2115  NA 138  K 3.8  CL 104  CO2 23  GLUCOSE 122*  BUN 20  CREATININE 1.26*  CALCIUM 8.8*  AST 35  ALT 15*  ALKPHOS 51  BILITOT 0.8   ------------------------------------------------------------------------------------------------------------------ CrCl cannot be calculated (Unknown ideal weight.). ------------------------------------------------------------------------------------------------------------------ No results for input(s): HGBA1C in the last 72 hours. ------------------------------------------------------------------------------------------------------------------ No results for input(s): CHOL, HDL, LDLCALC, TRIG, CHOLHDL, LDLDIRECT in the  last 72 hours. ------------------------------------------------------------------------------------------------------------------ No results for input(s): TSH, T4TOTAL, T3FREE, THYROIDAB in the last 72 hours.  Invalid input(s): FREET3 ------------------------------------------------------------------------------------------------------------------ No results for input(s): VITAMINB12, FOLATE, FERRITIN, TIBC, IRON, RETICCTPCT in the last 72 hours.  Coagulation profile  Recent Labs Lab 06/01/15 2115  INR 3.44*    No results for input(s): DDIMER in the last 72 hours.  Cardiac Enzymes No results for input(s): CKMB, TROPONINI, MYOGLOBIN in the last 168 hours.  Invalid input(s): CK ------------------------------------------------------------------------------------------------------------------ Invalid input(s): POCBNP   CBG: No results for input(s): GLUCAP in the last 168 hours.     EKG: Independently reviewed.   Assessment/Plan Hemorrhagic cystitis: Acute. Patient reports 2 day history of bloody urine for which catheter was placed. Initially treated with Bactrim,  but on acute presentation UA positive. Physical exam reveals dark red blood with clots. Urology Dr. Pilar Jarvis consultation to see patient in a.m. - Admit to a telemetry bed - Bladder irrigation as needed for clots - Empric rocephin  - Follow-up INR in a.m. - Check with urology to see if CT scan is to be done during current hospitalization - Urology to evaluate in a.m.  Acute blood loss anemia:  Patient report mild shortness of breath and generalized weakness. Hemoglobin previously noted to be around 10 the earlier part of this month now 2 g low at 8.2 on admission. On physical exam patient seen with dark red urine with clots in the Foley. - Check H&H every 4 hours - T&S  - Transfuse the patient PRBCs if hemoglobin drops below 8 secondary to cardiac history  Chronic atrial fibrillation: Appears currently rate  controlled heart rates less than 110. INR supratherapeutic chadsvasc score of 5 - Continue to monitor  Subtherapeutic INR: Acute. Per patient his goal INR was noted to be from 2-2.5 - Held warfarin  Acute kidney injury: Baseline kidney function 0.82 on 04/10/2015, but acutely elevated at 1.26 on admission. Suspect secondary to acute urinary tract infection and hematuria. -Follow-up BMP   History diastolic CHF last EF noted to be 55-60% in 2015. On physical  exam patient with 1+ pitting edema of the lower extremities. - Follow-up BNP and CXR  Essential hypertension - Continue amlodipine  Diabetes mellitus type 2 - Held Januvia - Hypoglycemic protocols  - CBGs with meals with a sensitive sliding scale of insulin  Hyperlipidemia - Continue atorvastatin  BPH - Continue finasteride and Flomax  GERD - Protonix  SCDs for DVT prophylaxis   Code Status:   full Family Communication: bedside Disposition Plan: admit   Total time spent 55 minutes.Greater than 50% of this time was spent in counseling, explanation of diagnosis, planning of further management, and coordination of care  [facility] Pager [phone]  If 7PM-7AM, please contact night-coverage [email or web address] Password [facility] 06/01/2015, 10:35 PM

## 2015-06-01 NOTE — ED Notes (Addendum)
Per pt, states hematuria on and off for awhile-had foley place by urologist this week-thinks foley is leaking

## 2015-06-01 NOTE — Telephone Encounter (Signed)
Attempted to call the pt back and left him a VM.  Did note in EPIC that the pt checked into [facility] ED at 1647. Will route this message to Dr Meda Coffee as an Juluis Rainier.

## 2015-06-02 ENCOUNTER — Observation Stay (HOSPITAL_COMMUNITY): Payer: Medicare Other

## 2015-06-02 DIAGNOSIS — E785 Hyperlipidemia, unspecified: Secondary | ICD-10-CM | POA: Diagnosis present

## 2015-06-02 DIAGNOSIS — N3091 Cystitis, unspecified with hematuria: Secondary | ICD-10-CM

## 2015-06-02 DIAGNOSIS — E1140 Type 2 diabetes mellitus with diabetic neuropathy, unspecified: Secondary | ICD-10-CM | POA: Diagnosis present

## 2015-06-02 DIAGNOSIS — Z8546 Personal history of malignant neoplasm of prostate: Secondary | ICD-10-CM | POA: Diagnosis not present

## 2015-06-02 DIAGNOSIS — R791 Abnormal coagulation profile: Secondary | ICD-10-CM

## 2015-06-02 DIAGNOSIS — Y842 Radiological procedure and radiotherapy as the cause of abnormal reaction of the patient, or of later complication, without mention of misadventure at the time of the procedure: Secondary | ICD-10-CM | POA: Diagnosis present

## 2015-06-02 DIAGNOSIS — D62 Acute posthemorrhagic anemia: Secondary | ICD-10-CM | POA: Diagnosis present

## 2015-06-02 DIAGNOSIS — N3040 Irradiation cystitis without hematuria: Secondary | ICD-10-CM | POA: Diagnosis not present

## 2015-06-02 DIAGNOSIS — I110 Hypertensive heart disease with heart failure: Secondary | ICD-10-CM | POA: Diagnosis present

## 2015-06-02 DIAGNOSIS — I5032 Chronic diastolic (congestive) heart failure: Secondary | ICD-10-CM | POA: Diagnosis not present

## 2015-06-02 DIAGNOSIS — K219 Gastro-esophageal reflux disease without esophagitis: Secondary | ICD-10-CM | POA: Diagnosis present

## 2015-06-02 DIAGNOSIS — Z923 Personal history of irradiation: Secondary | ICD-10-CM | POA: Diagnosis not present

## 2015-06-02 DIAGNOSIS — Z87891 Personal history of nicotine dependence: Secondary | ICD-10-CM | POA: Diagnosis not present

## 2015-06-02 DIAGNOSIS — Z8249 Family history of ischemic heart disease and other diseases of the circulatory system: Secondary | ICD-10-CM | POA: Diagnosis not present

## 2015-06-02 DIAGNOSIS — Z8042 Family history of malignant neoplasm of prostate: Secondary | ICD-10-CM | POA: Diagnosis not present

## 2015-06-02 DIAGNOSIS — N400 Enlarged prostate without lower urinary tract symptoms: Secondary | ICD-10-CM | POA: Diagnosis present

## 2015-06-02 DIAGNOSIS — I482 Chronic atrial fibrillation: Secondary | ICD-10-CM

## 2015-06-02 DIAGNOSIS — N3041 Irradiation cystitis with hematuria: Secondary | ICD-10-CM | POA: Diagnosis present

## 2015-06-02 DIAGNOSIS — R350 Frequency of micturition: Secondary | ICD-10-CM | POA: Diagnosis present

## 2015-06-02 DIAGNOSIS — C61 Malignant neoplasm of prostate: Secondary | ICD-10-CM | POA: Diagnosis not present

## 2015-06-02 DIAGNOSIS — Z951 Presence of aortocoronary bypass graft: Secondary | ICD-10-CM | POA: Diagnosis not present

## 2015-06-02 DIAGNOSIS — Z9079 Acquired absence of other genital organ(s): Secondary | ICD-10-CM | POA: Diagnosis not present

## 2015-06-02 DIAGNOSIS — R310 Gross hematuria: Secondary | ICD-10-CM | POA: Diagnosis not present

## 2015-06-02 DIAGNOSIS — Z8551 Personal history of malignant neoplasm of bladder: Secondary | ICD-10-CM | POA: Diagnosis not present

## 2015-06-02 DIAGNOSIS — N3289 Other specified disorders of bladder: Secondary | ICD-10-CM | POA: Diagnosis not present

## 2015-06-02 DIAGNOSIS — N401 Enlarged prostate with lower urinary tract symptoms: Secondary | ICD-10-CM | POA: Diagnosis not present

## 2015-06-02 DIAGNOSIS — N179 Acute kidney failure, unspecified: Secondary | ICD-10-CM

## 2015-06-02 DIAGNOSIS — R319 Hematuria, unspecified: Secondary | ICD-10-CM | POA: Diagnosis not present

## 2015-06-02 DIAGNOSIS — R0602 Shortness of breath: Secondary | ICD-10-CM | POA: Diagnosis not present

## 2015-06-02 LAB — BASIC METABOLIC PANEL
Anion gap: 8 (ref 5–15)
BUN: 17 mg/dL (ref 6–20)
CHLORIDE: 104 mmol/L (ref 101–111)
CO2: 25 mmol/L (ref 22–32)
CREATININE: 1.17 mg/dL (ref 0.61–1.24)
Calcium: 8.6 mg/dL — ABNORMAL LOW (ref 8.9–10.3)
GFR, EST NON AFRICAN AMERICAN: 56 mL/min — AB (ref >60)
Glucose, Bld: 109 mg/dL — ABNORMAL HIGH (ref 65–99)
Potassium: 3.8 mmol/L (ref 3.5–5.1)
SODIUM: 137 mmol/L (ref 135–145)

## 2015-06-02 LAB — APTT: APTT: 36 s (ref 24–37)

## 2015-06-02 LAB — PROTIME-INR
INR: 2.06 — ABNORMAL HIGH (ref 0.00–1.49)
Prothrombin Time: 23 seconds — ABNORMAL HIGH (ref 11.6–15.2)

## 2015-06-02 LAB — CBC
HCT: 27.1 % — ABNORMAL LOW (ref 39.0–52.0)
HEMATOCRIT: 26.7 % — AB (ref 39.0–52.0)
HEMOGLOBIN: 8.4 g/dL — AB (ref 13.0–17.0)
Hemoglobin: 8.6 g/dL — ABNORMAL LOW (ref 13.0–17.0)
MCH: 25.9 pg — AB (ref 26.0–34.0)
MCH: 26.1 pg (ref 26.0–34.0)
MCHC: 31.5 g/dL (ref 30.0–36.0)
MCHC: 31.7 g/dL (ref 30.0–36.0)
MCV: 82.4 fL (ref 78.0–100.0)
MCV: 82.4 fL (ref 78.0–100.0)
PLATELETS: 280 10*3/uL (ref 150–400)
Platelets: 248 10*3/uL (ref 150–400)
RBC: 3.24 MIL/uL — ABNORMAL LOW (ref 4.22–5.81)
RBC: 3.29 MIL/uL — ABNORMAL LOW (ref 4.22–5.81)
RDW: 16.2 % — AB (ref 11.5–15.5)
RDW: 16.3 % — ABNORMAL HIGH (ref 11.5–15.5)
WBC: 9.1 10*3/uL (ref 4.0–10.5)
WBC: 9.9 10*3/uL (ref 4.0–10.5)

## 2015-06-02 LAB — GLUCOSE, CAPILLARY
GLUCOSE-CAPILLARY: 145 mg/dL — AB (ref 65–99)
Glucose-Capillary: 112 mg/dL — ABNORMAL HIGH (ref 65–99)
Glucose-Capillary: 99 mg/dL (ref 65–99)
Glucose-Capillary: 122 mg/dL — ABNORMAL HIGH (ref 65–99)
Glucose-Capillary: 119 mg/dL — ABNORMAL HIGH (ref 65–99)

## 2015-06-02 LAB — PREPARE RBC (CROSSMATCH)

## 2015-06-02 LAB — HEMOGLOBIN AND HEMATOCRIT, BLOOD
HEMATOCRIT: 26.5 % — AB (ref 39.0–52.0)
HEMOGLOBIN: 8.5 g/dL — AB (ref 13.0–17.0)

## 2015-06-02 LAB — BRAIN NATRIURETIC PEPTIDE: B Natriuretic Peptide: 431.3 pg/mL — ABNORMAL HIGH (ref 0.0–100.0)

## 2015-06-02 MED ORDER — SODIUM CHLORIDE 0.9 % IV BOLUS (SEPSIS): 1000.0000 mL | Freq: Once | INTRAVENOUS | Status: DC

## 2015-06-02 MED ORDER — SODIUM CHLORIDE 0.9 % IV SOLN
INTRAVENOUS | Status: AC
  Administered 2015-06-02 (×2): via INTRAVENOUS

## 2015-06-02 NOTE — Progress Notes (Signed)
Pharmacy Antibiotic Note  Eric Rivers is a 82 y.o. male admitted on 06/01/2015 with UTI.  Pharmacy has been consulted for Ceftriaxone dosing.  Plan: Ceftriaxone 1gm iv q24hr  Height: 5\' 8"  (172.7 cm) Weight: 227 lb 8.2 oz (103.2 kg) IBW/kg (Calculated) : 68.4  Temp (24hrs), Avg:97.9 F (36.6 C), Min:97.7 F (36.5 C), Max:98.2 F (36.8 C)   Recent Labs Lab 06/01/15 2115  WBC 10.4  CREATININE 1.26*    Estimated Creatinine Clearance: 52.6 mL/min (by C-G formula based on Cr of 1.26).    Allergies  Allergen Reactions  . Metoprolol Other (See Comments)    Marked bradycardia at 25 mg bid    Antimicrobials this admission: Ceftriaxone 1gm iv q24hr   Microbiology results: pending  Thank you for allowing pharmacy to be a part of this patient's care.  Eric Rivers 06/02/2015 6:13 AM

## 2015-06-02 NOTE — Consult Note (Signed)
8:55 AM   Eric Rivers 03/20/1930 [id number]  Referring provider: Dr. Sandi Mariscal  Chief Complaint  Patient presents with  . Hematuria    HPI: 1 - Moderate Risk Prostate Cancer - Gleason 4+3 = 7 2013 diagnosed by [name] ,completed in April 2014; he also had new adjuvant/adjuvant testosterone blockade.   Recent Post-Treatment PSA Course:  01/2014 <0.01  02/2015 <0.01   2 - High Grade Superficial Bladder Cancer - Last resection 2006. No prior BCG.  Recent Surveillance:  01/2013 Cysto NED  01/2014 Cysto NED  02/2015 Cysto NED    3 - Enlarged Prostate With Urinary Urgency, Frequency - TRUS 2013 55m (on finasteride) on tamsulosin + finasteride for mix of obstructive and irritative symptoms. No retention. This is worsened by Lasix. Likely prior TURP by cysto.    4 - Gross Hematuria - Pt with new gross hematuria while on cruise ship 04/2015 DX's with cystitis, treated, and now resolved and back to baseline. CT 11/2014 w/o overt upper tract masses. He is on chronic coumadin. UA today w/o sig infectious parameters.    PMH sig for aortic dissection/CAD/CABG/Coumadin. No baseline limitiation, he is avid traveller going on many cruises a year. His PCP is Cathlean Cower MD   Today, The patient was readmitted to the hospital with clot retention. His catheter was exchanged in the emergency department as it became obstructed. His hemoglobin was mostly a 0.2 when he had previously been 9.5. He also is noted to have a supratherapeutic INR of 3.44. He was given 1 unit of packed red blood cells. His hemoglobin is now 8.4 and INR is 2.06.   PMH: Past Medical History  Diagnosis Date  . Whooping cough   . Atrial fibrillation (HCC)      Chronic,   24 hour holter, September, 2011.... atrial fib rate is controlled.... there is some bradycardia but  no marked pauses  . Diabetes mellitus     type 2  . Hypertension   . DDD (degenerative disc disease)     lumbar spine  . GERD  (gastroesophageal reflux disease)   . Gout   . Fluid overload   . ACE-inhibitor cough   . Shortness of breath     on exertion  . Aortic stenosis     mild....echo... september... 2010/ mild... echo.[name] KitchenMarland KitchenDec, 2011  . Pulmonary hypertension ([place])     5mmHg, echo, 01/2010  . Hyperlipidemia   . Hx of colonoscopy     approx. 10 years  . Warfarin anticoagulation     Atrial fib  . Ejection fraction     EF 60%, echo, 01/2010  . Normal nuclear stress test     06/2005 , also ABI normal 2007  . Hypercholesterolemia   . Fracture of metacarpal bone 11/30/11  . Aortic dissection [facility])     Surgical repair February, 2014  . Mitral regurgitation   . Diabetes mellitus with neuropathy ([place]) 12/04/2006    Qualifier: Diagnosis of  By: Linda Hedges MD, Heinz Knuckles  medications - DPP4   . Bladder cancer [facility])     recurrence with TUR-B March '09  . Prostate cancer ([place])     radiation tx.  . Diverticulosis of colon with hemorrhage 01/01/2014    Surgical History: Past Surgical History  Procedure Laterality Date  . Lumbar laminectomy      '02  . Tur-bt '06, '09    . Bladder cancer biopsy  10/16/2010    negative for malignancy  . Bladder microscopic  04/13/2007  high grade papillary urothelial lesions  . 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 Prostate biopsy  09/25/2011    GLEASON 3+3=6 AND 4+3=7  . Ascending aortic root replacement N/A 04/15/2012    Procedure: ASCENDING AORTIC ROOT REPLACEMENT;  Surgeon: Gaye Pollack, MD;  Location: [place] OR;  Service: Open Heart Surgery;  Laterality: N/A;  . Tee without cardioversion N/A 04/15/2012    Procedure: TRANSESOPHAGEAL ECHOCARDIOGRAM (TEE);  Surgeon: Gaye Pollack, MD;  Location: [place];  Service: Open Heart Surgery;  Laterality: N/A;  . Exploration post operative open heart  04/2012  . Cystoscopy/retrograde/ureteroscopy Bilateral 04/04/2013    Procedure: CYSTOSCOPY WITH RETROGRADE PYELOGRAM AND BLADDER BIOPSY;  Surgeon: Molli Hazard, MD;  Location: [facility] OR[facility];  Service: Urology;  Laterality: Bilateral;  . Herniatic disc surgery    . Colonoscopy N/A 01/01/2014    Procedure: COLONOSCOPY;  Surgeon: Gatha Mayer, MD;  Location: [facility] END[facility];  Service: Endoscopy;  Laterality: N/A;    Home Medications:    Medication List    ASK your doctor about these medications        acetaminophen 500 MG tablet  Commonly known as:  TYLENOL  Take 1,000 mg by mouth 3 (three) times daily as needed for mild pain, moderate pain, fever or headache.     allopurinol 300 MG tablet  Commonly known as:  ZYLOPRIM  take 1 tablet by mouth once daily     amLODipine 2.5 MG tablet  Commonly known as:  NORVASC  Take 1 tablet (2.5 mg total) by mouth daily.     atorvastatin 20 MG tablet  Commonly known as:  LIPITOR  take 1 tablet by mouth every morning     CALCIUM 600 + D PO  Take 1 tablet by mouth at bedtime and may repeat dose one time if needed.     desonide 0.05 % lotion  Commonly known as:  DESOWEN  Apply 1 application topically daily as needed (for face).     finasteride 5 MG tablet  Commonly known as:  PROSCAR  Take 1 tablet (5 mg total) by mouth every morning.     furosemide 40 MG tablet  Commonly known as:  LASIX  Take 40 mg by mouth daily as needed for fluid or edema.     JANUVIA 100 MG tablet  Generic drug:  sitaGLIPtin  take 1 tablet by mouth once daily     ketoconazole 2 % shampoo  Commonly known as:  NIZORAL  Apply 1 application topically 4 (four) times a week.     ONCOVITE PO  Take 1 tablet by mouth daily.     pantoprazole 40 MG tablet  Commonly known as:  PROTONIX  take 1 tablet by mouth once daily     potassium chloride 10 MEQ tablet  Commonly known as:  K-DUR,KLOR-CON  Take 1 tablet (10 mEq total) by mouth daily.     senna 8.6 MG tablet  Commonly known as:  SENOKOT  Take 1-2 tablets by mouth at bedtime as needed for constipation.     sulfamethoxazole-trimethoprim 800-160 MG tablet   Commonly known as:  BACTRIM DS,SEPTRA DS  Take 1 tablet by mouth 2 (two) times daily.     tamsulosin 0.4 MG Caps capsule  Commonly known as:  FLOMAX  Take 0.4 mg by mouth at bedtime.     traZODone 50 MG tablet  Commonly known as:  DESYREL  take 1/2  TO ONE  tablet by mouth at bedtime if needed for sleep     warfarin 4 MG tablet  Commonly known as:  COUMADIN  Take as directed by anticoagulation clinic        Allergies:  Allergies  Allergen Reactions  . Metoprolol Other (See Comments)    Marked bradycardia at 25 mg bid    Family History: Family History  Problem Relation Age of Onset  . Prostate cancer Father 21    passed with prostate ca  . Cancer Father   . Stroke Father   . Heart disease Mother   . Hypertension Mother   . Heart attack Mother     Social History:  reports that he quit smoking about 40 years ago. His smoking use included Cigarettes. He smoked 1.00 pack per day. He has never used smokeless tobacco. He reports that he drinks about 7.0 oz of alcohol per week. He reports that he does not use illicit drugs.  ROS: 12 point ROS negative except per HPI                                        Physical Exam: BP 132/61 mmHg  Pulse 76  Temp(Src) 98.2 F (36.8 C) (Oral)  Resp 18  Ht 5\' 8"  (1.727 m)  Wt 227 lb 8.2 oz (103.2 kg)  BMI 34.60 kg/m2  SpO2 92%  Constitutional:  Alert and oriented, No acute distress. HEENT: [place] AT, moist mucus membranes.  Trachea midline, no masses. Cardiovascular: No clubbing, cyanosis, or edema. Respiratory: Normal respiratory effort, no increased work of breathing. GI: Abdomen is soft, nontender, nondistended, no abdominal masses GU: No CVA tenderness. Normal phallus. His testicles descended bilaterally. A 16 French catheter in place draining dark tea-colored urine. Skin: No rashes, bruises or suspicious lesions. Lymph: No cervical or inguinal adenopathy. Neurologic: Grossly intact, no focal deficits,  moving all 4 extremities. Psychiatric: Normal mood and affect.  Laboratory Data: Lab Results  Component Value Date   WBC 9.1 06/02/2015   HGB 8.4* 06/02/2015   HCT 26.7* 06/02/2015   MCV 82.4 06/02/2015   PLT 248 06/02/2015    Lab Results  Component Value Date   CREATININE 1.17 06/02/2015    Lab Results  Component Value Date   PSA 2.19 12/06/2007    No results found for: TESTOSTERONE  Lab Results  Component Value Date   HGBA1C 6.6* 04/10/2015    Urinalysis    Component Value Date/Time   COLORURINE RED* 06/01/2015 2123   APPEARANCEUR TURBID* 06/01/2015 2123   LABSPEC 1.020 06/01/2015 2123   PHURINE 6.0 06/01/2015 2123   GLUCOSEU NEGATIVE 06/01/2015 2123   GLUCOSEU NEGATIVE 04/10/2015 1230   HGBUR LARGE* 06/01/2015 2123   BILIRUBINUR LARGE* 06/01/2015 2123   KETONESUR 40* 06/01/2015 2123   PROTEINUR 30* 06/01/2015 2123   UROBILINOGEN 0.2 04/10/2015 1230   NITRITE POSITIVE* 06/01/2015 2123   LEUKOCYTESUR LARGE* 06/01/2015 2123   Procedure: The patient's 56 French Foley catheter was exchanged for a 28 [place] Foley catheter under usual sterile conditions. His 20 French catheter was then irrigated without return of clots. It is draining clear light pink to yellow urine at this time.   Assessment & Plan:    1 - Gross Hematuria   -This is likely multi factorial in etiology. The most likely culprit is a combination of supratherapeutic INR in combination with radiation cystitis. I am less concerned about a  bladder malignancy as she had a negative cystoscopy 3 months ago. He is currently scheduled for a CT urogram as an outpatient. His hematuria seems to be resolving based on how his urine output looks on exam. If he remains stable throughout the day, he can be discharged home with his Foley catheter in place and follow-up with our nurse practitioners as currently scheduled on Tuesday.  2 - Moderate Risk Prostate Cancer - excellent biochemical control, continue yearly  surveillance.    3 - High Grade Superficial Bladder Cancer - NED by cysto today. Continue yearly surveillance.     4 - Enlarged Prostate With Urinary Urgency, Frequency - stable on tamsulosin + finasteride, continue.    5 - RTC as previously scheduled in about 9 mos for cysto with PSA prior.   Nickie Retort, MD

## 2015-06-02 NOTE — Progress Notes (Signed)
[facility] PROGRESS NOTE    Progress Note   Eric Rivers [id number] DOB: [birth year] DOA: 06/01/2015 PCP: Cathlean Cower, MD   Brief Narrative:   Eric Rivers is an 82 y.o. male past medical history significant for hypertension bladder cancer status post resection in 2012 prostate cancer status post radiation with chronic atrial fibrillation on Coumadin, diastolic heart failure with an EF of 55% to present with bloody urine  Assessment/Plan:   Hemorrhagic cystitis: Discontinue telemetry is atrial fibrillation is chronic. Continue married information is needed, I agree with IV Rocephin. Hbg is at baseline usually runs around 9.5. This likely due to supratherapeutic INR. He was on Bactrim has a significant interaction with Coumadin. Awaiting urology recommendations. Continues to have hematuria. We'll check CT scan of the abdomen and pelvis with contrast as he was supposed to have a CT this month  Acute blood loss anemia: Has dark red urine DC hemoglobin checks every 4 hours, check CBCs every 12. Transfuse if hemoglobin at 7 artery continues to be symptomatic.  Chronic atrial fibrillation: INR today is therapeutic. He is rate controlled DC telemetry.  Acute kidney injury: With a baseline creatinine of less than 1 pack in 04/10/2015. Acutely elevated on admission. Improving continue IV hydration.  Chronic diastolic heart failure: With an EF of 55% thousand and 15.  Essential hypertension: Continue current regimen.  Diabetes mellitus type 2: Januvia was held continue CBGs checked. Continue CBGs and sliding scale     DVT Prophylaxis - coumadin Family Communication: none Disposition Plan: Home 2 days Code Status:     Code Status Orders        Start     Ordered   06/01/15 2332  Full code   Continuous     06/01/15 2336    Code Status History    Date Active Date Inactive Code Status Order ID Comments User Context   12/29/2013  1:12 AM 01/01/2014  6:01  PM Full Code [id number]  Rise Patience, MD Inpatient   07/19/2013 11:22 PM 07/21/2013  2:55 PM DNR [id number]  Shanda Howells, MD ED   07/19/2013 11:17 PM 07/19/2013 11:22 PM Full Code [id number]  Shanda Howells, MD ED   04/19/2012  2:17 PM 04/21/2012  9:33 PM Full Code [id number]  Gaye Pollack, MD Inpatient   04/15/2012 10:33 AM 04/15/2012  8:36 PM Full Code [id number]  Gaye Pollack, MD Inpatient        IV Access:    Peripheral IV   Procedures and diagnostic studies:   Dg Chest Port 1 View  Jun 10, 2015  CLINICAL DATA:  Acute onset of intermittent hematuria after Foley catheter placement. Shortness of breath with exertion. Initial encounter. EXAM: PORTABLE CHEST 1 VIEW COMPARISON:  CTA of the chest performed 11/29/2014, and chest radiograph from 07/20/2013 FINDINGS: The lungs are well-aerated. Mild left basilar opacity likely reflects atelectasis. No pleural effusion or pneumothorax is seen. There is no evidence of focal opacification, pleural effusion or pneumothorax. The cardiomediastinal silhouette is enlarged. The patient is status post median sternotomy. No acute osseous abnormalities are seen. Clips are noted overlying the right axilla. IMPRESSION: Mild left basilar airspace opacity likely reflects atelectasis. Cardiomegaly noted. Electronically Signed   By: Garald Balding M.D.   On: 06/10/2015 00:16     Medical Consultants:    None.  Anti-Infectives:   Anti-infectives    Start     Dose/Rate Route Frequency Ordered Stop   06/01/15 2345  cefTRIAXone (ROCEPHIN) 1 g in dextrose 5 %  50 mL IVPB     1 g 100 mL/hr over 30 Minutes Intravenous Every 24 hours 06/01/15 2337        Subjective:    Eric Rivers No complains.  Objective:    Filed Vitals:   06/02/15 0028 06/02/15 0150 06/02/15 0220 06/02/15 0435  BP: 121/53 127/60 132/56 132/61  Pulse: 64 65 67 76  Temp: 98.1 F (36.7 C) 97.7 F (36.5 C) 97.8 F (36.6 C) 98.2 F (36.8 C)  TempSrc: Oral Oral Oral Oral  Resp:  20 18 20 18   Height: 5\' 8"  (1.727 m)     Weight: 103.2 kg (227 lb 8.2 oz)     SpO2: 96% 97% 95% 92%    Intake/Output Summary (Last 24 hours) at 06/02/15 0731 Last data filed at 06/02/15 0446  Gross per 24 hour  Intake 311.67 ml  Output    250 ml  Net  61.67 ml   Filed Weights   06/02/15 0028  Weight: 103.2 kg (227 lb 8.2 oz)    Exam: Gen:  NAD Cardiovascular:  RRR. Chest and lungs:   CTAB Abdomen:  Abdomen soft, NT/ND, + BS Extremities:  No edema   Data Reviewed:    Labs: Basic Metabolic Panel:  Recent Labs Lab 06/01/15 2115 06/02/15 0629  NA 138 137  K 3.8 3.8  CL 104 104  CO2 23 25  GLUCOSE 122* 109*  BUN 20 17  CREATININE 1.26* 1.17  CALCIUM 8.8* 8.6*   GFR Estimated Creatinine Clearance: 56.7 mL/min (by C-G formula based on Cr of 1.17). Liver Function Tests:  Recent Labs Lab 06/01/15 2115  AST 35  ALT 15*  ALKPHOS 51  BILITOT 0.8  PROT 6.8  ALBUMIN 3.6   No results for input(s): LIPASE, AMYLASE in the last 168 hours. No results for input(s): AMMONIA in the last 168 hours. Coagulation profile  Recent Labs Lab 06/01/15 2115 06/02/15 0629  INR 3.44* 2.06*    CBC:  Recent Labs Lab 06/01/15 2115 06/02/15 0629  WBC 10.4  --   NEUTROABS 8.1*  --   HGB 8.2* 8.5*  HCT 25.8* 26.5*  MCV 83.0  --   PLT 283  --    Cardiac Enzymes: No results for input(s): CKTOTAL, CKMB, CKMBINDEX, TROPONINI in the last 168 hours. BNP (last 3 results) No results for input(s): PROBNP in the last 8760 hours. CBG: No results for input(s): GLUCAP in the last 168 hours. D-Dimer: No results for input(s): DDIMER in the last 72 hours. Hgb A1c: No results for input(s): HGBA1C in the last 72 hours. Lipid Profile: No results for input(s): CHOL, HDL, LDLCALC, TRIG, CHOLHDL, LDLDIRECT in the last 72 hours. Thyroid function studies: No results for input(s): TSH, T4TOTAL, T3FREE, THYROIDAB in the last 72 hours.  Invalid input(s): FREET3 Anemia work up: No  results for input(s): VITAMINB12, FOLATE, FERRITIN, TIBC, IRON, RETICCTPCT in the last 72 hours. Sepsis Labs:  Recent Labs Lab 06/01/15 2115  WBC 10.4   Microbiology No results found for this or any previous visit (from the past 240 hour(s)).   Medications:   . allopurinol  300 mg Oral Daily  . amLODipine  2.5 mg Oral Daily  . atorvastatin  20 mg Oral q1800  . cefTRIAXone (ROCEPHIN)  IV  1 g Intravenous Q24H  . finasteride  5 mg Oral q morning - 10a  . insulin aspart  0-5 Units Subcutaneous QHS  . insulin aspart  0-9 Units Subcutaneous TID WC  . pantoprazole  40 mg Oral Daily  . sodium chloride flush  3 mL Intravenous Q12H  . tamsulosin  0.4 mg Oral QHS   Continuous Infusions:   Time spent: 25 min   LOS: 1 day   Charlynne Cousins  [facility] Pager [phone]  *Please refer to [email or web address], password [email or web address] to get updated schedule on who will round on this patient, as hospitalists switch teams weekly. If 7PM-7AM, please contact night-coverage at [email or web address], password [email or web address] for any overnight needs.  06/02/2015, 7:31 AM

## 2015-06-03 LAB — CBC
HCT: 27.7 % — ABNORMAL LOW (ref 39.0–52.0)
Hemoglobin: 8.8 g/dL — ABNORMAL LOW (ref 13.0–17.0)
MCH: 26.9 pg (ref 26.0–34.0)
MCHC: 31.8 g/dL (ref 30.0–36.0)
MCV: 84.7 fL (ref 78.0–100.0)
PLATELETS: 267 10*3/uL (ref 150–400)
RBC: 3.27 MIL/uL — ABNORMAL LOW (ref 4.22–5.81)
RDW: 16.5 % — AB (ref 11.5–15.5)
WBC: 8.2 10*3/uL (ref 4.0–10.5)

## 2015-06-03 LAB — URINE CULTURE
Culture: NO GROWTH
Special Requests: NORMAL

## 2015-06-03 LAB — GLUCOSE, CAPILLARY
GLUCOSE-CAPILLARY: 102 mg/dL — AB (ref 65–99)
GLUCOSE-CAPILLARY: 175 mg/dL — AB (ref 65–99)

## 2015-06-03 NOTE — Discharge Summary (Signed)
Physician Discharge Summary  Eric Rivers [id number] DOB: 03/08/1930 DOA: 06/01/2015  PCP: Cathlean Cower, MD  Admit date: 06/01/2015 Discharge date: 06/03/2015  Time spent: 35 minutes  Recommendations for Outpatient Follow-up:  Follow-up with urology this week as an outpatient.  Discharge Diagnoses:  Principal Problem:   Hemorrhagic cystitis Active Problems:   Diabetes mellitus with neuropathy (HCC)   Chronic atrial fibrillation (HCC)   Hyperlipidemia   Chronic diastolic CHF (congestive heart failure) (HCC)   Elevated INR   Anemia   AKI (acute kidney injury) [facility])   Discharge Condition: stable  Diet recommendation: heart healthy  Filed Weights   06/02/15 0028 06/03/15 0521  Weight: 103.2 kg (227 lb 8.2 oz) 104 kg (229 lb 4.5 oz)    History of present illness:  82 year old male with past medical history significant for HTN, HLD, bladder cancer s/p resection 2012, prostate cancer s/p radiation, chronic atrial fibrillation on Coumadin, diastolic CHF last EF 0000000 in 07/2013 ; who presents with bloody urine for the last 2 days. Upon admission to the emergency department patient is evaluated and flushed. Initial hemoglobin 8.2, WBC 10.4, BUN 20, creatinine 1.26, INR 3.44. Urinalysis positive for many bacteria, nitrate positive, large leukocyte esterase, 6-30 WBCs. Patient was given 10 mg of vitamin K   Hospital Course:  Hemorrhagic cystitis: Urology was consulted and he was given oral vitamin K and his INR slowly trending down. He was on Bactrim before coming to the ED and Coumadin and his INR was close to goal. Neurology saw as an inpatient exchange the Foley and his urine seemed to be clearing. Will go home off antibiotics and continue to follow-up with urology as an outpatient on 06/05/2015.  Normocytic anemia: Improvement of May stable mature is clearing restart Coumadin as an outpatient.  Chronic atrial fibrillation: No changes were made to his medication.  Acute  renal failure: Represent creatinine of less than 1 elevated on admission likely due to prerenal. Resolved with IV hydration.  Chronic diastolic heart failure: Billy stable changes made worse medication.  Essential hypertension: Continue current regimen to   Procedures:  CXR  Consultations:  urology  Discharge Exam: Filed Vitals:   06/02/15 2051 06/03/15 0521  BP: 127/58 143/68  Pulse: 67 65  Temp: 98.1 F (36.7 C) 98 F (36.7 C)  Resp: 16 20    General: A&O x3 Cardiovascular: RRR Respiratory: good air movement CTA B/L  Discharge Instructions   Discharge Instructions    Diet - low sodium heart healthy    Complete by:  As directed      Increase activity slowly    Complete by:  As directed           Current Discharge Medication List    CONTINUE these medications which have NOT CHANGED   Details  acetaminophen (TYLENOL) 500 MG tablet Take 1,000 mg by mouth 3 (three) times daily as needed for mild pain, moderate pain, fever or headache.     allopurinol (ZYLOPRIM) 300 MG tablet take 1 tablet by mouth once daily Qty: 90 tablet, Refills: 1    amLODipine (NORVASC) 2.5 MG tablet Take 1 tablet (2.5 mg total) by mouth daily. Qty: 90 tablet, Refills: 3    atorvastatin (LIPITOR) 20 MG tablet take 1 tablet by mouth every morning Qty: 90 tablet, Refills: 3    Calcium Carb-Cholecalciferol (CALCIUM 600 + D PO) Take 1 tablet by mouth at bedtime and may repeat dose one time if needed.     desonide (DESOWEN) 0.05 %  lotion Apply 1 application topically daily as needed (for face).    finasteride (PROSCAR) 5 MG tablet Take 1 tablet (5 mg total) by mouth every morning. Qty: 90 tablet, Refills: 3    furosemide (LASIX) 40 MG tablet Take 40 mg by mouth daily as needed for fluid or edema.     JANUVIA 100 MG tablet take 1 tablet by mouth once daily Qty: 90 tablet, Refills: 2    ketoconazole (NIZORAL) 2 % shampoo Apply 1 application topically 4 (four) times a week.     Multiple Vitamins-Minerals (ONCOVITE PO) Take 1 tablet by mouth daily.     pantoprazole (PROTONIX) 40 MG tablet take 1 tablet by mouth once daily Qty: 90 tablet, Refills: 3    potassium chloride (K-DUR,KLOR-CON) 10 MEQ tablet Take 1 tablet (10 mEq total) by mouth daily. Qty: 90 tablet, Refills: 1    senna (SENOKOT) 8.6 MG tablet Take 1-2 tablets by mouth at bedtime as needed for constipation.     sulfamethoxazole-trimethoprim (BACTRIM DS,SEPTRA DS) 800-160 MG tablet Take 1 tablet by mouth 2 (two) times daily. Refills: 0    Tamsulosin HCl (FLOMAX) 0.4 MG CAPS Take 0.4 mg by mouth at bedtime.     traZODone (DESYREL) 50 MG tablet take 1/2  TO ONE tablet by mouth at bedtime if needed for sleep Qty: 90 tablet, Refills: 1    warfarin (COUMADIN) 4 MG tablet Take as directed by anticoagulation clinic Qty: 120 tablet, Refills: 1       Allergies  Allergen Reactions  . Metoprolol Other (See Comments)    Marked bradycardia at 25 mg bid      The results of significant diagnostics from this hospitalization (including imaging, microbiology, ancillary and laboratory) are listed below for reference.    Significant Diagnostic Studies: Dg Chest Port 1 View  06/02/2015  CLINICAL DATA:  Acute onset of intermittent hematuria after Foley catheter placement. Shortness of breath with exertion. Initial encounter. EXAM: PORTABLE CHEST 1 VIEW COMPARISON:  CTA of the chest performed 11/29/2014, and chest radiograph from 07/20/2013 FINDINGS: The lungs are well-aerated. Mild left basilar opacity likely reflects atelectasis. No pleural effusion or pneumothorax is seen. There is no evidence of focal opacification, pleural effusion or pneumothorax. The cardiomediastinal silhouette is enlarged. The patient is status post median sternotomy. No acute osseous abnormalities are seen. Clips are noted overlying the right axilla. IMPRESSION: Mild left basilar airspace opacity likely reflects atelectasis. Cardiomegaly  noted. Electronically Signed   By: Garald Balding M.D.   On: 06/02/2015 00:16    Microbiology: No results found for this or any previous visit (from the past 240 hour(s)).   Labs: Basic Metabolic Panel:  Recent Labs Lab 06/01/15 2115 06/02/15 0629  NA 138 137  K 3.8 3.8  CL 104 104  CO2 23 25  GLUCOSE 122* 109*  BUN 20 17  CREATININE 1.26* 1.17  CALCIUM 8.8* 8.6*   Liver Function Tests:  Recent Labs Lab 06/01/15 2115  AST 35  ALT 15*  ALKPHOS 51  BILITOT 0.8  PROT 6.8  ALBUMIN 3.6   No results for input(s): LIPASE, AMYLASE in the last 168 hours. No results for input(s): AMMONIA in the last 168 hours. CBC:  Recent Labs Lab 06/01/15 2115 06/02/15 0629 06/02/15 0753 06/02/15 1935 06/03/15 0709  WBC 10.4  --  9.1 9.9 8.2  NEUTROABS 8.1*  --   --   --   --   HGB 8.2* 8.5* 8.4* 8.6* 8.8*  HCT 25.8* 26.5* 26.7*  27.1* 27.7*  MCV 83.0  --  82.4 82.4 84.7  PLT 283  --  248 280 267   Cardiac Enzymes: No results for input(s): CKTOTAL, CKMB, CKMBINDEX, TROPONINI in the last 168 hours. BNP: BNP (last 3 results)  Recent Labs  06/02/15 0629  BNP 431.3*    ProBNP (last 3 results) No results for input(s): PROBNP in the last 8760 hours.  CBG:  Recent Labs Lab 06/02/15 1130 06/02/15 1805 06/02/15 2155 06/02/15 2247 06/03/15 0753  GLUCAP 145* 99 122* 119* 102*    Signed:  Charlynne Cousins MD.  [facility] 06/03/2015, 8:21 AM

## 2015-06-03 NOTE — Progress Notes (Signed)
Patient is alert and oriented x 4, ambulatory. Patient is to discharge today with foley catheter. Discharge instruction was reviewed, questions concerns denied. Additional instruction provided regarding foley catheter care.

## 2015-06-04 LAB — TYPE AND SCREEN
ABO/RH(D): O POS
Antibody Screen: NEGATIVE
UNIT DIVISION: 0

## 2015-06-06 ENCOUNTER — Telehealth: Payer: Self-pay | Admitting: General Practice

## 2015-06-06 DIAGNOSIS — R310 Gross hematuria: Secondary | ICD-10-CM | POA: Diagnosis not present

## 2015-06-06 DIAGNOSIS — R338 Other retention of urine: Secondary | ICD-10-CM | POA: Diagnosis not present

## 2015-06-06 NOTE — Telephone Encounter (Signed)
Patient called stating that he has been in the hospital over the weekend.  Patient had a UTI with blood in the urine. Patient has been off coumadin since 3/31.  Hospital discharge note says to resume coumadin.  Patient is also on Bactrim.  Pt given instructions to take 4 mg of coumadin daily and have INR checked on Wedneday, 4/12.  Patient verbalized instructions.

## 2015-06-11 DIAGNOSIS — R310 Gross hematuria: Secondary | ICD-10-CM | POA: Diagnosis not present

## 2015-06-12 ENCOUNTER — Encounter (HOSPITAL_COMMUNITY): Payer: Self-pay | Admitting: Emergency Medicine

## 2015-06-12 ENCOUNTER — Observation Stay (HOSPITAL_COMMUNITY)
Admission: EM | Admit: 2015-06-12 | Discharge: 2015-06-14 | Disposition: A | Discharge status: Home / Self Care | Payer: Medicare Other | Attending: Urology | Admitting: Urology

## 2015-06-12 DIAGNOSIS — E1140 Type 2 diabetes mellitus with diabetic neuropathy, unspecified: Secondary | ICD-10-CM | POA: Insufficient documentation

## 2015-06-12 DIAGNOSIS — I272 Other secondary pulmonary hypertension: Secondary | ICD-10-CM | POA: Diagnosis not present

## 2015-06-12 DIAGNOSIS — Z7982 Long term (current) use of aspirin: Secondary | ICD-10-CM | POA: Insufficient documentation

## 2015-06-12 DIAGNOSIS — E785 Hyperlipidemia, unspecified: Secondary | ICD-10-CM | POA: Diagnosis not present

## 2015-06-12 DIAGNOSIS — Z7984 Long term (current) use of oral hypoglycemic drugs: Secondary | ICD-10-CM | POA: Insufficient documentation

## 2015-06-12 DIAGNOSIS — R338 Other retention of urine: Secondary | ICD-10-CM | POA: Diagnosis not present

## 2015-06-12 DIAGNOSIS — I2510 Atherosclerotic heart disease of native coronary artery without angina pectoris: Secondary | ICD-10-CM | POA: Diagnosis not present

## 2015-06-12 DIAGNOSIS — N3041 Irradiation cystitis with hematuria: Secondary | ICD-10-CM | POA: Insufficient documentation

## 2015-06-12 DIAGNOSIS — R310 Gross hematuria: Secondary | ICD-10-CM | POA: Diagnosis not present

## 2015-06-12 DIAGNOSIS — Z8551 Personal history of malignant neoplasm of bladder: Secondary | ICD-10-CM | POA: Insufficient documentation

## 2015-06-12 DIAGNOSIS — Z7901 Long term (current) use of anticoagulants: Secondary | ICD-10-CM | POA: Diagnosis not present

## 2015-06-12 DIAGNOSIS — N401 Enlarged prostate with lower urinary tract symptoms: Principal | ICD-10-CM | POA: Insufficient documentation

## 2015-06-12 DIAGNOSIS — R319 Hematuria, unspecified: Secondary | ICD-10-CM | POA: Diagnosis not present

## 2015-06-12 DIAGNOSIS — I110 Hypertensive heart disease with heart failure: Secondary | ICD-10-CM | POA: Diagnosis not present

## 2015-06-12 DIAGNOSIS — Z87891 Personal history of nicotine dependence: Secondary | ICD-10-CM | POA: Diagnosis not present

## 2015-06-12 DIAGNOSIS — Z951 Presence of aortocoronary bypass graft: Secondary | ICD-10-CM | POA: Insufficient documentation

## 2015-06-12 DIAGNOSIS — N3289 Other specified disorders of bladder: Secondary | ICD-10-CM | POA: Insufficient documentation

## 2015-06-12 DIAGNOSIS — E7800 Pure hypercholesterolemia, unspecified: Secondary | ICD-10-CM | POA: Insufficient documentation

## 2015-06-12 DIAGNOSIS — Z8546 Personal history of malignant neoplasm of prostate: Secondary | ICD-10-CM | POA: Diagnosis not present

## 2015-06-12 DIAGNOSIS — Z79899 Other long term (current) drug therapy: Secondary | ICD-10-CM | POA: Insufficient documentation

## 2015-06-12 DIAGNOSIS — M109 Gout, unspecified: Secondary | ICD-10-CM | POA: Insufficient documentation

## 2015-06-12 DIAGNOSIS — I509 Heart failure, unspecified: Secondary | ICD-10-CM | POA: Insufficient documentation

## 2015-06-12 DIAGNOSIS — R339 Retention of urine, unspecified: Secondary | ICD-10-CM | POA: Diagnosis not present

## 2015-06-12 DIAGNOSIS — Y842 Radiological procedure and radiotherapy as the cause of abnormal reaction of the patient, or of later complication, without mention of misadventure at the time of the procedure: Secondary | ICD-10-CM | POA: Diagnosis not present

## 2015-06-12 DIAGNOSIS — N210 Calculus in bladder: Secondary | ICD-10-CM | POA: Insufficient documentation

## 2015-06-12 DIAGNOSIS — R3915 Urgency of urination: Secondary | ICD-10-CM | POA: Insufficient documentation

## 2015-06-12 DIAGNOSIS — R0902 Hypoxemia: Secondary | ICD-10-CM | POA: Diagnosis not present

## 2015-06-12 DIAGNOSIS — I4891 Unspecified atrial fibrillation: Secondary | ICD-10-CM | POA: Insufficient documentation

## 2015-06-12 DIAGNOSIS — R350 Frequency of micturition: Secondary | ICD-10-CM | POA: Insufficient documentation

## 2015-06-12 DIAGNOSIS — K219 Gastro-esophageal reflux disease without esophagitis: Secondary | ICD-10-CM | POA: Insufficient documentation

## 2015-06-12 DIAGNOSIS — I080 Rheumatic disorders of both mitral and aortic valves: Secondary | ICD-10-CM | POA: Diagnosis not present

## 2015-06-12 LAB — CBC WITH DIFFERENTIAL/PLATELET
Basophils Absolute: 0 10*3/uL (ref 0.0–0.1)
Basophils Relative: 0 %
Eosinophils Absolute: 0 10*3/uL (ref 0.0–0.7)
Eosinophils Relative: 0 %
HEMATOCRIT: 29.3 % — AB (ref 39.0–52.0)
HEMOGLOBIN: 9.3 g/dL — AB (ref 13.0–17.0)
LYMPHS ABS: 0.5 10*3/uL — AB (ref 0.7–4.0)
LYMPHS PCT: 3 %
MCH: 25.4 pg — AB (ref 26.0–34.0)
MCHC: 31.7 g/dL (ref 30.0–36.0)
MCV: 80.1 fL (ref 78.0–100.0)
MONOS PCT: 8 %
Monocytes Absolute: 1.4 10*3/uL — ABNORMAL HIGH (ref 0.1–1.0)
NEUTROS ABS: 14.9 10*3/uL — AB (ref 1.7–7.7)
NEUTROS PCT: 89 %
PLATELETS: 286 10*3/uL (ref 150–400)
RBC: 3.66 MIL/uL — ABNORMAL LOW (ref 4.22–5.81)
RDW: 16.5 % — ABNORMAL HIGH (ref 11.5–15.5)
WBC: 16.8 10*3/uL — ABNORMAL HIGH (ref 4.0–10.5)

## 2015-06-12 LAB — BASIC METABOLIC PANEL
Anion gap: 14 (ref 5–15)
BUN: 24 mg/dL — AB (ref 6–20)
CHLORIDE: 100 mmol/L — AB (ref 101–111)
CO2: 19 mmol/L — ABNORMAL LOW (ref 22–32)
Calcium: 9.2 mg/dL (ref 8.9–10.3)
Creatinine, Ser: 1.45 mg/dL — ABNORMAL HIGH (ref 0.61–1.24)
GFR, EST AFRICAN AMERICAN: 50 mL/min — AB (ref >60)
GFR, EST NON AFRICAN AMERICAN: 43 mL/min — AB (ref >60)
Glucose, Bld: 145 mg/dL — ABNORMAL HIGH (ref 65–99)
POTASSIUM: 4.5 mmol/L (ref 3.5–5.1)
SODIUM: 133 mmol/L — AB (ref 135–145)

## 2015-06-12 LAB — PROTIME-INR
INR: 1.86 — AB (ref 0.00–1.49)
PROTHROMBIN TIME: 21.4 s — AB (ref 11.6–15.2)

## 2015-06-12 MED ORDER — MORPHINE SULFATE (PF) 4 MG/ML IV SOLN
4.0000 mg | Freq: Once | INTRAVENOUS | Status: AC
  Administered 2015-06-12: 4 mg via INTRAVENOUS
  Filled 2015-06-12: qty 1

## 2015-06-12 MED ORDER — LIDOCAINE HCL 2 % EX GEL
1.0000 "application " | Freq: Once | CUTANEOUS | Status: AC
  Administered 2015-06-12: 1 via URETHRAL
  Filled 2015-06-12: qty 11

## 2015-06-12 NOTE — ED Notes (Signed)
Pt started having hematuria at the beginning of march and has been having UTI symptoms on and off since. Went to urologist today and had the bladder irrigated and clots flushed out. Catheter did not stay in. Pt had lasix and water and has not been able to urinate since 3pm. Alert and oriented.

## 2015-06-12 NOTE — ED Notes (Signed)
Bed: WA02 Expected date:  Expected time:  Means of arrival:  Comments: 82 yo M  Urinary problems

## 2015-06-12 NOTE — ED Provider Notes (Addendum)
CSN: [id number]     Arrival date & time 06/12/15  2051 History   First MD Initiated Contact with Patient 06/12/15 2105     Chief Complaint  Patient presents with  . Urinary Retention     (Consider location/radiation/quality/duration/timing/severity/associated sxs/prior Treatment) HPI...[name] KitchenMarland KitchenPatient complains of urinary retention. He was in the urologist's office this afternoon with similar symptoms. A catheter was inserted and the nurse practitioner irrigated the catheter. He was sent home without a catheter. This evening he was unable to urinate again. He has a history of bladder cancer in the past with resections 2 in [place]. He is on Coumadin for atrial fibrillation.  Past Medical History  Diagnosis Date  . Whooping cough   . Atrial fibrillation (HCC)      Chronic,   24 hour holter, September, 2011.... atrial fib rate is controlled.... there is some bradycardia but  no marked pauses  . Diabetes mellitus     type 2  . Hypertension   . DDD (degenerative disc disease)     lumbar spine  . GERD (gastroesophageal reflux disease)   . Gout   . Fluid overload   . ACE-inhibitor cough   . Shortness of breath     on exertion  . Aortic stenosis     mild....echo... september... 2010/ mild... echo.[name] KitchenMarland KitchenDec, 2011  . Pulmonary hypertension ([place])     68mmHg, echo, 01/2010  . Hyperlipidemia   . Hx of colonoscopy     approx. 10 years  . Warfarin anticoagulation     Atrial fib  . Ejection fraction     EF 60%, echo, 01/2010  . Normal nuclear stress test     06/2005 , also ABI normal 2007  . Hypercholesterolemia   . Fracture of metacarpal bone 11/30/11  . Aortic dissection [facility])     Surgical repair February, 2014  . Mitral regurgitation   . Diabetes mellitus with neuropathy ([place]) 12/04/2006    Qualifier: Diagnosis of  By: Linda Hedges MD, Heinz Knuckles  medications - DPP4   . Bladder cancer [facility])     recurrence with TUR-B March '09  . Prostate cancer ([place])     radiation tx.  . Diverticulosis of  colon with hemorrhage 01/01/2014   Past Surgical History  Procedure Laterality Date  . Lumbar laminectomy      '02  . Tur-bt '06, '09    . Bladder cancer biopsy  10/16/2010    negative for malignancy  . Bladder microscopic  04/13/2007    high grade papillary urothelial lesions  . 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 Prostate biopsy  09/25/2011    GLEASON 3+3=6 AND 4+3=7  . Ascending aortic root replacement N/A 04/15/2012    Procedure: ASCENDING AORTIC ROOT REPLACEMENT;  Surgeon: Gaye Pollack, MD;  Location: [place] OR;  Service: Open Heart Surgery;  Laterality: N/A;  . Tee without cardioversion N/A 04/15/2012    Procedure: TRANSESOPHAGEAL ECHOCARDIOGRAM (TEE);  Surgeon: Gaye Pollack, MD;  Location: [place];  Service: Open Heart Surgery;  Laterality: N/A;  . Exploration post operative open heart  04/2012  . Cystoscopy/retrograde/ureteroscopy Bilateral 04/04/2013    Procedure: CYSTOSCOPY WITH RETROGRADE PYELOGRAM AND BLADDER BIOPSY;  Surgeon: Molli Hazard, MD;  [facility];  Service: Urology;  Laterality: Bilateral;  . Herniatic disc surgery    . Colonoscopy N/A 01/01/2014    Procedure: COLONOSCOPY;  Surgeon: Gatha Mayer, MD;  [facility];  Service: Endoscopy;  Laterality:  N/A;   Family History  Problem Relation Age of Onset  . Prostate cancer Father 61    passed with prostate ca  . Cancer Father   . Stroke Father   . Heart disease Mother   . Hypertension Mother   . Heart attack Mother    Social History  Substance Use Topics  . Smoking status: Former Smoker -- 1.00 packs/day    Types: Cigarettes    Quit date: 03/04/1975  . Smokeless tobacco: Never Used  . Alcohol Use: 7.0 oz/week    14 drink(s) per week     Comment: liquor daily    Review of Systems  All other systems reviewed and are negative.     Allergies  Metoprolol  Home Medications   Prior to Admission medications   Medication Sig Start Date End Date  Taking? Authorizing Provider  acetaminophen (TYLENOL) 500 MG tablet Take 1,000 mg by mouth 3 (three) times daily as needed for mild pain, moderate pain, fever or headache.    Yes Historical Provider, MD  allopurinol (ZYLOPRIM) 300 MG tablet take 1 tablet by mouth once daily Patient taking differently: Take 300 mg by mouth once daily 01/15/15  Yes Biagio Borg, MD  amLODipine (NORVASC) 2.5 MG tablet Take 1 tablet (2.5 mg total) by mouth daily. 07/17/14  Yes Carlena Bjornstad, MD  atorvastatin (LIPITOR) 20 MG tablet take 1 tablet by mouth every morning Patient taking differently: Take 20 mg by mouth every morning 07/17/14  Yes Biagio Borg, MD  Calcium Carb-Cholecalciferol (CALCIUM 600 + D PO) Take 1 tablet by mouth at bedtime.    Yes Historical Provider, MD  desonide (DESOWEN) 0.05 % lotion Apply 1 application topically daily as needed (rosacea).    Yes Historical Provider, MD  finasteride (PROSCAR) 5 MG tablet Take 1 tablet (5 mg total) by mouth every morning. 12/23/13  Yes Biagio Borg, MD  furosemide (LASIX) 40 MG tablet Take 40 mg by mouth daily as needed for fluid or edema.    Yes Historical Provider, MD  JANUVIA 100 MG tablet take 1 tablet by mouth once daily Patient taking differently: Take 100 mg by mouth once daily 01/01/15  Yes Biagio Borg, MD  ketoconazole (NIZORAL) 2 % shampoo Apply 1 application topically 4 (four) times a week.   Yes Historical Provider, MD  Multiple Vitamins-Minerals (ONCOVITE PO) Take 1 tablet by mouth daily.    Yes Historical Provider, MD  pantoprazole (PROTONIX) 40 MG tablet take 1 tablet by mouth once daily Patient taking differently: Take 40 mg by mouth once daily 07/17/14  Yes Biagio Borg, MD  potassium chloride (K-DUR,KLOR-CON) 10 MEQ tablet Take 1 tablet (10 mEq total) by mouth daily. 07/14/14  Yes Carlena Bjornstad, MD  senna (SENOKOT) 8.6 MG tablet Take 1 tablet by mouth at bedtime.    Yes Historical Provider, MD  Tamsulosin HCl (FLOMAX) 0.4 MG CAPS Take 0.4 mg by  mouth at bedtime.    Yes Historical Provider, MD  traZODone (DESYREL) 50 MG tablet take 1/2  TO ONE tablet by mouth at bedtime if needed for sleep Patient taking differently: TAKE 25 TO 50 MG BY MOUTH AT BEDTIME AS NEEDED FOR SLEEP 03/06/15  Yes Biagio Borg, MD  aspirin EC 325 MG tablet Take 325 mg by mouth daily.    Historical Provider, MD  warfarin (COUMADIN) 4 MG tablet Take as directed by anticoagulation clinic Patient taking differently: Take 4-8 mg by mouth See admin instructions. Take  4 mg by mouth daily except on Thursday take 8 mg 05/14/15   Biagio Borg, MD   BP 150/85 mmHg  Pulse 95  Temp(Src) 97.9 F (36.6 C) (Oral)  Resp 18  SpO2 96% Physical Exam  Constitutional: He is oriented to person, place, and time. He appears well-developed and well-nourished.  HENT:  Head: Normocephalic and atraumatic.  Eyes: Conjunctivae and EOM are normal. Pupils are equal, round, and reactive to light.  Neck: Normal range of motion. Neck supple.  Cardiovascular: Normal rate and regular rhythm.   Pulmonary/Chest: Effort normal and breath sounds normal.  Abdominal: Soft. Bowel sounds are normal.  Tender suprapubic area  Genitourinary:  No flank tenderness  Musculoskeletal: Normal range of motion.  Neurological: He is alert and oriented to person, place, and time.  Skin: Skin is warm and dry.  Psychiatric: He has a normal mood and affect. His behavior is normal.  Nursing note and vitals reviewed.   ED Course  Procedures (including critical care time) Labs Review Labs Reviewed  CBC WITH DIFFERENTIAL/PLATELET - Abnormal; Notable for the following:    WBC 16.8 (*)    RBC 3.66 (*)    Hemoglobin 9.3 (*)    HCT 29.3 (*)    MCH 25.4 (*)    RDW 16.5 (*)    Neutro Abs 14.9 (*)    Lymphs Abs 0.5 (*)    Monocytes Absolute 1.4 (*)    All other components within normal limits  BASIC METABOLIC PANEL - Abnormal; Notable for the following:    Sodium 133 (*)    Chloride 100 (*)    CO2 19 (*)     Glucose, Bld 145 (*)    BUN 24 (*)    Creatinine, Ser 1.45 (*)    GFR calc non Af Amer 43 (*)    GFR calc Af Amer 50 (*)    All other components within normal limits  PROTIME-INR - Abnormal; Notable for the following:    Prothrombin Time 21.4 (*)    INR 1.86 (*)    All other components within normal limits  URINALYSIS, ROUTINE W REFLEX MICROSCOPIC (NOT AT [facility])    Imaging Review No results found. I have personally reviewed and evaluated these images and lab results as part of my medical decision-making.   EKG Interpretation None      MDM   Final diagnoses:  Urinary retention    Foley catheter inserted, but nurse was unable to inflate the balloon secondary to pain. Will consult urology.  Disc c Dr Tresa Moore.   He will evaluate patient.    Nat Christen, MD 06/12/15 [id number]  Nat Christen, MD 06/13/15 [phone]

## 2015-06-13 ENCOUNTER — Emergency Department (HOSPITAL_COMMUNITY): Payer: Medicare Other | Admitting: Anesthesiology

## 2015-06-13 ENCOUNTER — Encounter (HOSPITAL_COMMUNITY)
Admission: EM | Disposition: A | Discharge status: Home / Self Care | Payer: Self-pay | Source: Home / Self Care | Attending: Emergency Medicine

## 2015-06-13 ENCOUNTER — Encounter (HOSPITAL_COMMUNITY): Payer: Self-pay | Admitting: *Deleted

## 2015-06-13 ENCOUNTER — Ambulatory Visit: Payer: Medicare Other

## 2015-06-13 DIAGNOSIS — N400 Enlarged prostate without lower urinary tract symptoms: Secondary | ICD-10-CM | POA: Diagnosis not present

## 2015-06-13 DIAGNOSIS — R350 Frequency of micturition: Secondary | ICD-10-CM | POA: Diagnosis not present

## 2015-06-13 DIAGNOSIS — I10 Essential (primary) hypertension: Secondary | ICD-10-CM | POA: Diagnosis not present

## 2015-06-13 DIAGNOSIS — R319 Hematuria, unspecified: Secondary | ICD-10-CM | POA: Diagnosis present

## 2015-06-13 DIAGNOSIS — N401 Enlarged prostate with lower urinary tract symptoms: Secondary | ICD-10-CM | POA: Diagnosis not present

## 2015-06-13 DIAGNOSIS — I509 Heart failure, unspecified: Secondary | ICD-10-CM | POA: Diagnosis not present

## 2015-06-13 DIAGNOSIS — N3041 Irradiation cystitis with hematuria: Secondary | ICD-10-CM | POA: Diagnosis not present

## 2015-06-13 DIAGNOSIS — R310 Gross hematuria: Secondary | ICD-10-CM | POA: Diagnosis not present

## 2015-06-13 DIAGNOSIS — N210 Calculus in bladder: Secondary | ICD-10-CM | POA: Diagnosis not present

## 2015-06-13 DIAGNOSIS — N3289 Other specified disorders of bladder: Secondary | ICD-10-CM | POA: Diagnosis not present

## 2015-06-13 DIAGNOSIS — R338 Other retention of urine: Secondary | ICD-10-CM | POA: Diagnosis not present

## 2015-06-13 HISTORY — PX: TRANSURETHRAL RESECTION OF BLADDER TUMOR: SHX2575

## 2015-06-13 LAB — GLUCOSE, CAPILLARY
GLUCOSE-CAPILLARY: 121 mg/dL — AB (ref 65–99)
GLUCOSE-CAPILLARY: 113 mg/dL — AB (ref 65–99)
GLUCOSE-CAPILLARY: 86 mg/dL (ref 65–99)
GLUCOSE-CAPILLARY: 106 mg/dL — AB (ref 65–99)
Glucose-Capillary: 110 mg/dL — ABNORMAL HIGH (ref 65–99)

## 2015-06-13 LAB — HEMOGLOBIN AND HEMATOCRIT, BLOOD
HCT: 25.8 % — ABNORMAL LOW (ref 39.0–52.0)
HEMOGLOBIN: 8.3 g/dL — AB (ref 13.0–17.0)

## 2015-06-13 SURGERY — TURBT (TRANSURETHRAL RESECTION OF BLADDER TUMOR)
Anesthesia: General | Site: Bladder

## 2015-06-13 MED ORDER — ALLOPURINOL 300 MG PO TABS
300.0000 mg | ORAL_TABLET | Freq: Every day | ORAL | Status: DC
  Administered 2015-06-13 – 2015-06-14 (×2): 300 mg via ORAL
  Filled 2015-06-13 (×2): qty 1

## 2015-06-13 MED ORDER — ONDANSETRON HCL 4 MG/2ML IJ SOLN: 4.0000 mg | INTRAMUSCULAR | Status: DC | PRN

## 2015-06-13 MED ORDER — MORPHINE SULFATE (PF) 2 MG/ML IV SOLN
2.0000 mg | INTRAVENOUS | Status: DC | PRN
  Administered 2015-06-14: 2 mg via INTRAVENOUS
  Filled 2015-06-13: qty 1

## 2015-06-13 MED ORDER — TRAZODONE HCL 50 MG PO TABS
25.0000 mg | ORAL_TABLET | Freq: Every evening | ORAL | Status: DC | PRN
  Administered 2015-06-13 (×2): 25 mg via ORAL
  Filled 2015-06-13 (×2): qty 1

## 2015-06-13 MED ORDER — ATORVASTATIN CALCIUM 20 MG PO TABS
20.0000 mg | ORAL_TABLET | Freq: Every day | ORAL | Status: DC
  Administered 2015-06-13: 20 mg via ORAL
  Filled 2015-06-13 (×3): qty 1

## 2015-06-13 MED ORDER — PROPOFOL 10 MG/ML IV BOLUS
INTRAVENOUS | Status: AC
  Filled 2015-06-13: qty 20

## 2015-06-13 MED ORDER — LACTATED RINGERS IV SOLN
INTRAVENOUS | Status: DC | PRN
  Administered 2015-06-13: 02:00:00 via INTRAVENOUS

## 2015-06-13 MED ORDER — SENNOSIDES-DOCUSATE SODIUM 8.6-50 MG PO TABS
1.0000 | ORAL_TABLET | Freq: Two times a day (BID) | ORAL | Status: DC
  Administered 2015-06-13 – 2015-06-14 (×4): 1 via ORAL
  Filled 2015-06-13 (×4): qty 1

## 2015-06-13 MED ORDER — FINASTERIDE 5 MG PO TABS
5.0000 mg | ORAL_TABLET | Freq: Every morning | ORAL | Status: DC
  Administered 2015-06-13 – 2015-06-14 (×2): 5 mg via ORAL
  Filled 2015-06-13 (×2): qty 1

## 2015-06-13 MED ORDER — INSULIN ASPART 100 UNIT/ML ~~LOC~~ SOLN
0.0000 [IU] | Freq: Three times a day (TID) | SUBCUTANEOUS | Status: DC
  Administered 2015-06-14: 3 [IU] via SUBCUTANEOUS

## 2015-06-13 MED ORDER — FENTANYL CITRATE (PF) 100 MCG/2ML IJ SOLN
INTRAMUSCULAR | Status: DC | PRN
  Administered 2015-06-13 (×2): 25 ug via INTRAVENOUS

## 2015-06-13 MED ORDER — ATORVASTATIN CALCIUM 20 MG PO TABS
20.0000 mg | ORAL_TABLET | Freq: Every morning | ORAL | Status: DC
  Filled 2015-06-13: qty 1

## 2015-06-13 MED ORDER — AMLODIPINE BESYLATE 2.5 MG PO TABS
2.5000 mg | ORAL_TABLET | Freq: Every day | ORAL | Status: DC
  Administered 2015-06-13 – 2015-06-14 (×2): 2.5 mg via ORAL
  Filled 2015-06-13 (×2): qty 1

## 2015-06-13 MED ORDER — LACTATED RINGERS IV SOLN: INTRAVENOUS | Status: DC

## 2015-06-13 MED ORDER — DEXTROSE 5 % IV SOLN
1.0000 g | Freq: Once | INTRAVENOUS | Status: AC
  Administered 2015-06-13: 1 g via INTRAVENOUS
  Filled 2015-06-13: qty 10

## 2015-06-13 MED ORDER — INSULIN ASPART 100 UNIT/ML ~~LOC~~ SOLN
3.0000 [IU] | Freq: Three times a day (TID) | SUBCUTANEOUS | Status: DC
  Administered 2015-06-13 – 2015-06-14 (×3): 3 [IU] via SUBCUTANEOUS

## 2015-06-13 MED ORDER — SODIUM CHLORIDE 0.9 % IV SOLN
INTRAVENOUS | Status: DC
  Administered 2015-06-13 (×2): via INTRAVENOUS

## 2015-06-13 MED ORDER — ONDANSETRON HCL 4 MG/2ML IJ SOLN
INTRAMUSCULAR | Status: DC | PRN
  Administered 2015-06-13: 4 mg via INTRAVENOUS

## 2015-06-13 MED ORDER — SODIUM CHLORIDE 0.9 % IR SOLN
Status: DC | PRN
  Administered 2015-06-13: 9000 mL

## 2015-06-13 MED ORDER — HYDROMORPHONE HCL 1 MG/ML IJ SOLN: 0.2500 mg | INTRAMUSCULAR | Status: DC | PRN

## 2015-06-13 MED ORDER — TRAMADOL HCL 50 MG PO TABS
50.0000 mg | ORAL_TABLET | Freq: Four times a day (QID) | ORAL | Status: DC | PRN
  Administered 2015-06-14: 100 mg via ORAL
  Filled 2015-06-13 (×2): qty 2

## 2015-06-13 MED ORDER — FENTANYL CITRATE (PF) 100 MCG/2ML IJ SOLN
INTRAMUSCULAR | Status: AC
  Filled 2015-06-13: qty 2

## 2015-06-13 MED ORDER — PANTOPRAZOLE SODIUM 40 MG PO TBEC
40.0000 mg | DELAYED_RELEASE_TABLET | Freq: Every day | ORAL | Status: DC
  Administered 2015-06-13 – 2015-06-14 (×2): 40 mg via ORAL
  Filled 2015-06-13 (×2): qty 1

## 2015-06-13 SURGICAL SUPPLY — 14 items
BAG URINE DRAINAGE (UROLOGICAL SUPPLIES) IMPLANT
BAG URO CATCHER STRL LF (MISCELLANEOUS) ×3 IMPLANT
ELECT REM PT RETURN 9FT ADLT (ELECTROSURGICAL) ×3
ELECTRODE REM PT RTRN 9FT ADLT (ELECTROSURGICAL) ×1 IMPLANT
EVACUATOR MICROVAS BLADDER (UROLOGICAL SUPPLIES) IMPLANT
GLOVE BIOGEL M STRL SZ7.5 (GLOVE) ×3 IMPLANT
GOWN STRL REUS W/TWL LRG LVL3 (GOWN DISPOSABLE) ×6 IMPLANT
KIT ASPIRATION TUBING (SET/KITS/TRAYS/PACK) IMPLANT
LOOP CUT BIPOLAR 24F LRG (ELECTROSURGICAL) IMPLANT
MANIFOLD NEPTUNE II (INSTRUMENTS) ×3 IMPLANT
PACK CYSTO (CUSTOM PROCEDURE TRAY) ×3 IMPLANT
SYRINGE IRR TOOMEY STRL 70CC (SYRINGE) IMPLANT
TUBING CONNECTING 10 (TUBING) ×2 IMPLANT
TUBING CONNECTING 10' (TUBING) ×1

## 2015-06-13 NOTE — Anesthesia Preprocedure Evaluation (Addendum)
Anesthesia Evaluation  Patient identified by MRN, date of birth, ID band Patient awake    Reviewed: Allergy & Precautions, H&P , NPO status , Patient's Chart, lab work & pertinent test results, reviewed documented beta blocker date and time   Airway Mallampati: II  TM Distance: >3 FB Neck ROM: full    Dental  (+) Caps, Dental Advisory Given One cap lower front:   Pulmonary shortness of breath and with exertion, former smoker,  Pulmonary hypertension   Pulmonary exam normal breath sounds clear to auscultation       Cardiovascular hypertension, Pt. on home beta blockers +CHF  + dysrhythmias Atrial Fibrillation + Valvular Problems/Murmurs AS and MR  Rhythm:Irregular Rate:Normal + Systolic murmurs Combined systolic and diastolic heart failure. ECG AF Echo 07/2013 Study Conclusions - Left ventricle: The cavity size was normal. Wall thickness was normal. Systolic function was normal. The estimated ejectionfraction was in the range of 55% to 60%. Wall motion was normal;there were no regional wall motion abnormalities. The study isnot technically sufficient to allow evaluation of LV diastolicfunction. Doppler parameters are consistent with elevated mean left atrial filling pressure.- Aortic valve: There was mild stenosis.    Neuro/Psych negative neurological ROS  negative psych ROS   GI/Hepatic negative GI ROS, Neg liver ROS, GERD  Medicated and Controlled,  Endo/Other  diabetes, Well Controlled, Type 2, Oral Hypoglycemic Agents  Renal/GU   negative genitourinary   Musculoskeletal   Abdominal   Peds  Hematology negative hematology ROS (+)   Anesthesia Other Findings   Reproductive/Obstetrics negative OB ROS                            Anesthesia Physical  Anesthesia Plan  ASA: III  Anesthesia Plan: General   Post-op Pain Management:    Induction: Intravenous  Airway Management  Planned: LMA  Additional Equipment:   Intra-op Plan:   Post-operative Plan: Extubation in OR  Informed Consent: I have reviewed the patients History and Physical, chart, labs and discussed the procedure including the risks, benefits and alternatives for the proposed anesthesia with the patient or authorized representative who has indicated his/her understanding and acceptance.   Dental Advisory Given  Plan Discussed with: CRNA, Surgeon and Anesthesiologist  Anesthesia Plan Comments:         Anesthesia Quick Evaluation

## 2015-06-13 NOTE — ED Notes (Signed)
Completed CHG bath.

## 2015-06-13 NOTE — Anesthesia Procedure Notes (Signed)
Procedure Name: LMA Insertion Date/Time: 06/13/2015 1:45 AM Performed by: Anne Fu Pre-anesthesia Checklist: Patient identified, Emergency Drugs available, Suction available, Patient being monitored and Timeout performed Patient Re-evaluated:Patient Re-evaluated prior to inductionOxygen Delivery Method: Circle system utilized Preoxygenation: Pre-oxygenation with 100% oxygen Intubation Type: IV induction Ventilation: Mask ventilation without difficulty LMA: LMA inserted LMA Size: 4.0 Number of attempts: 1 Placement Confirmation: positive ETCO2 and breath sounds checked- equal and bilateral Tube secured with: Tape

## 2015-06-13 NOTE — Brief Op Note (Signed)
06/12/2015 - 06/13/2015  2:16 AM  PATIENT:  Eric Rivers  82 y.o. male  PRE-OPERATIVE DIAGNOSIS:  hematuria, clot retention  POST-OPERATIVE DIAGNOSIS:  hematuria, clot retention, prostatic Hypertrophy, Bladder Stone  PROCEDURE:  Procedure(s): TRANSURETHRAL RESECTION OF PROSTATE (TURP) CLOT EVACUATION AND FULGERATION, BLADDER STONE REMOVAL  (N/A)  SURGEON:  Surgeon(s) and Role:    * Alexis Frock, MD - Primary  PHYSICIAN ASSISTANT:   ASSISTANTS: none   ANESTHESIA:   general  EBL:     BLOOD ADMINISTERED:none  DRAINS: 15F 3 way foley to NS irrigation   LOCAL MEDICATIONS USED:  NONE  SPECIMEN:  Source of Specimen:  1 - bladder stone; 2- prostate chips  DISPOSITION OF SPECIMEN:  PATHOLOGY  COUNTS:  YES  TOURNIQUET:  * No tourniquets in log *  DICTATION: .Other Dictation: Dictation Number [id number]  PLAN OF CARE: Admit for overnight observation  PATIENT DISPOSITION:  PACU - hemodynamically stable.   Delay start of Pharmacological VTE agent (>24hrs) due to surgical blood loss or risk of bleeding: yes

## 2015-06-13 NOTE — Transfer of Care (Signed)
Immediate Anesthesia Transfer of Care Note  Patient: Eric Rivers  Procedure(s) Performed: Procedure(s): TRANSURETHRAL RESECTION OF PROSTATE (TURP) CLOT EVACUATION AND FULGERATION, BLADDER STONE REMOVAL  (N/A)  Patient Location: PACU  Anesthesia Type:General  Level of Consciousness:  sedated, patient cooperative and responds to stimulation  Airway & Oxygen Therapy:Patient Spontanous Breathing and Patient connected to face mask oxgen  Post-op Assessment:  Report given to PACU RN and Post -op Vital signs reviewed and stable  Post vital signs:  Reviewed and stable  Last Vitals:  Filed Vitals:   06/13/15 0026 06/13/15 0100  BP: 151/83 160/92  Pulse: 79 79  Temp:    Resp: 16     Complications: No apparent anesthesia complications

## 2015-06-13 NOTE — Anesthesia Postprocedure Evaluation (Signed)
Anesthesia Post Note  Patient: Eric Rivers  Procedure(s) Performed: Procedure(s) (LRB): TRANSURETHRAL RESECTION OF PROSTATE (TURP) CLOT EVACUATION AND FULGERATION, BLADDER STONE REMOVAL  (N/A)  Patient location during evaluation: PACU Anesthesia Type: General Level of consciousness: sedated Pain management: pain level controlled Vital Signs Assessment: post-procedure vital signs reviewed and stable Respiratory status: spontaneous breathing and respiratory function stable Cardiovascular status: stable Anesthetic complications: no    Last Vitals:  Filed Vitals:   06/13/15 0332 06/13/15 0435  BP: 135/77 119/53  Pulse: 82 60  Temp: 36.4 C 36.7 C  Resp: 20 18    Last Pain:  Filed Vitals:   06/13/15 0453  PainSc: 0-No pain                 [name] DANIEL

## 2015-06-13 NOTE — H&P (Signed)
Eric Rivers is an 82 y.o. male.    Chief Complaint: Recurrent Gross Hematuria / Clot Retention  HPI:   1 - Moderate Risk Prostate Cancer - Gleason 4+3 = 7 2013 diagnosed by Eric Rivers ,completed in April 2014; he also had new adjuvant/adjuvant testosterone blockade.  Pre-treatment PSA 3.17.  Recent Post-Treatment PSA Course: 01/2014 <0.01 02/2015 <0.01  2 - High Grade Superficial Bladder Cancer - Last resection 2006. No prior BCG. Recent Surveillance: 01/2013 Cysto NED 01/2014 Cysto NED 02/2015 Cysto NED    3 - Enlarged Prostate With Urinary Urgency, Frequency - TRUS 2013 51m(on finasteride) on tamsulosin + finasteride for mix of obstructive and irritative symptoms. No retention. This is worsened by Lasix.  Likely prior TURP by cysto.   4 - Gross Hematuria / Clot Retention - Pt with gross hematuria while on/off since 04/2015. Cysto 04/2015 with radiation changes to prostate / bladder neck but no obvious masses. In / out Urol office for 1 month, most recent UCX negative.   PMH sig for aortic dissection/CAD/CABG/AFIB (no CVA) Coumadin. No baseline limitiation, he is avid traveller going on many cruises a year. His PCP is Eric Rivers  Today " Eric Rivers" is seen for above. He has recurrent clot retention. INR 1.8, last coumadin dose today. ER unable to clear bladder. No interval fevers.   Past Medical History  Diagnosis Date  . Whooping cough   . Atrial fibrillation (HCC)      Chronic,   24 hour holter, September, 2011.... atrial fib rate is controlled.... there is some bradycardia but  no marked pauses  . Diabetes mellitus     type 2  . Hypertension   . DDD (degenerative disc disease)     lumbar spine  . GERD (gastroesophageal reflux disease)   . Gout   . Fluid overload   . ACE-inhibitor cough   . Shortness of breath     on exertion  . Aortic stenosis     mild....echo... september... 2010/ mild... echo..[name] KitchenMarland [place], 2011  . Pulmonary hypertension [place]     476[place], echo, 01/2010   . Hyperlipidemia   . Hx of colonoscopy     approx. 10 years  . Warfarin anticoagulation     Atrial fib  . Ejection fraction     EF 60%, echo, 01/2010  . Normal nuclear stress test     06/2005 , also ABI normal 2007  . Hypercholesterolemia   . Fracture of metacarpal bone 11/30/11  . [facility]    Surgical repair February, 2014  . Mitral regurgitation   . [place]    Qualifier: Diagnosis of  By: Eric Rivers - DPP4   . [facility]    recurrence with TUR-B March '09  . [place]    radiation tx.  . Diverticulosis of colon with hemorrhage 01/01/2014    Past Surgical History  Procedure Laterality Date  . Lumbar laminectomy      '02  . Tur-bt '06, '09    . Bladder cancer biopsy  10/16/2010    negative for malignancy  . Bladder microscopic  04/13/2007    high grade papillary urothelial lesions  . 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 Prostate biopsy  09/25/2011    GLEASON 3+3=6 AND 4+3=7  . Ascending aortic root replacement N/A 04/15/2012    Procedure: ASCENDING AORTIC ROOT REPLACEMENT;  Surgeon: Eric Rivers;  Location: [place];  Service: Open Heart Surgery;  Laterality: N/A;  . Tee without cardioversion N/A 04/15/2012    Procedure: TRANSESOPHAGEAL ECHOCARDIOGRAM (TEE);  Surgeon: Eric Rivers;  Location: [place];  Service: Open Heart Surgery;  Laterality: N/A;  . Exploration post operative open heart  04/2012  . Cystoscopy/retrograde/ureteroscopy Bilateral 04/04/2013    Procedure: CYSTOSCOPY WITH RETROGRADE PYELOGRAM AND BLADDER BIOPSY;  Surgeon: Eric Rivers;  [facility];  Service: Urology;  Laterality: Bilateral;  . Herniatic disc surgery    . Colonoscopy N/A 01/01/2014    Procedure: COLONOSCOPY;  Surgeon: Eric Mayer, Rivers;  [facility];  Service: Endoscopy;  Laterality: N/A;    Family History  Problem  Relation Age of Onset  . Prostate cancer Father 48    passed with prostate ca  . Cancer Father   . Stroke Father   . Heart disease Mother   . Hypertension Mother   . Heart attack Mother    Social History:  reports that he quit smoking about 40 years ago. His smoking use included Cigarettes. He smoked 1.00 pack per day. He has never used smokeless tobacco. He reports that he drinks about 7.0 oz of alcohol per week. He reports that he does not use illicit drugs.  Allergies:  Allergies  Allergen Reactions  . Metoprolol Other (See Comments)    Marked bradycardia at 25 mg bid     (Not in a hospital admission)  Results for orders placed or performed during the hospital encounter of 06/12/15 (from the past 48 hour(s))  CBC with Differential     Status: Abnormal   Collection Time: 06/12/15  9:44 PM  Result Value Ref Range   WBC 16.8 (H) 4.0 - 10.5 K/uL   RBC 3.66 (L) 4.22 - 5.81 MIL/uL   Hemoglobin 9.3 (L) 13.0 - 17.0 g/dL   HCT 29.3 (L) 39.0 - 52.0 %   MCV 80.1 78.0 - 100.0 fL   MCH 25.4 (L) 26.0 - 34.0 pg   MCHC 31.7 30.0 - 36.0 g/dL   RDW 16.5 (H) 11.5 - 15.5 %   Platelets 286 150 - 400 K/uL   Neutrophils Relative % 89 %   Neutro Abs 14.9 (H) 1.7 - 7.7 K/uL   Lymphocytes Relative 3 %   Lymphs Abs 0.5 (L) 0.7 - 4.0 K/uL   Monocytes Relative 8 %   Monocytes Absolute 1.4 (H) 0.1 - 1.0 K/uL   Eosinophils Relative 0 %   Eosinophils Absolute 0.0 0.0 - 0.7 K/uL   Basophils Relative 0 %   Basophils Absolute 0.0 0.0 - 0.1 K/uL  Basic metabolic panel     Status: Abnormal   Collection Time: 06/12/15  9:44 PM  Result Value Ref Range   Sodium 133 (L) 135 - 145 mmol/L   Potassium 4.5 3.5 - 5.1 mmol/L   Chloride 100 (L) 101 - 111 mmol/L   CO2 19 (L) 22 - 32 mmol/L   Glucose, Bld 145 (H) 65 - 99 mg/dL   BUN 24 (H) 6 - 20 mg/dL   Creatinine, Ser 1.45 (H) 0.61 - 1.24 mg/dL   Calcium 9.2 8.9 - 10.3 mg/dL   GFR calc non Af Amer 43 (L) >60 mL/min   GFR calc Af Amer 50 (L) >60 mL/min     Comment: (NOTE) The eGFR has been calculated using the CKD EPI equation. This calculation has not been validated in all clinical situations. eGFR's persistently <  60 mL/min signify possible Chronic Kidney Disease.    Anion gap 14 5 - 15  Protime-INR     Status: Abnormal   Collection Time: 06/12/15 10:13 PM  Result Value Ref Range   Prothrombin Time 21.4 (H) 11.6 - 15.2 seconds   INR 1.86 (H) 0.00 - 1.49   No results found.  Review of Systems  Constitutional: Negative.  Negative for fever.  HENT: Negative.   Eyes: Negative.   Respiratory: Negative.   Cardiovascular: Negative.   Genitourinary: Positive for frequency and hematuria.  Musculoskeletal: Negative.   Skin: Negative.   Neurological: Negative.   Endo/Heme/Allergies: Negative.   Psychiatric/Behavioral: Negative.     Blood pressure 151/83, pulse 79, temperature 97.9 F (36.6 C), temperature source Oral, resp. rate 16, SpO2 97 %. Physical Exam  Constitutional: He appears well-developed.  Elderly but very spry mentally  HENT:  Head: Normocephalic.  Eyes: Pupils are equal, round, and reactive to light.  Neck: Normal range of motion.  Cardiovascular: Normal rate.   Respiratory: Effort normal.  GI: Soft.  obese  Genitourinary:  Catheter in place, appears likely NOT in urinary bladder with gross hematuria, does not irrigate.   Musculoskeletal: Normal range of motion.  Neurological: He is alert.  Skin: Skin is warm.  Psychiatric: He has a normal mood and affect. His behavior is normal. Judgment and thought content normal.     Assessment/Plan  1 - Moderate Risk Prostate Cancer - excellent biochemical control, continue yearly surveillance.  2 - High Grade Superficial Bladder Cancer - no recurrence by most recent office cysto, will re-eval at OR cysto today.    3 - Enlarged Prostate With Urinary Urgency, Frequency - stable on tamsulosin + finasteride, continue.   4 - Gross Hematuria - likely radiation-induced  prostate friability / radiation cystitis changes exacerbated by coumadin use. He has failed outpatient manamgnet. Rec hold coumadin + OR for cysto / clot evacuation / fulgeration fo bleeders, possible retrogrades today for further diagnosis and treatment. I do not feel Vit K or FFP waranted as INR not severly elevated, but will consider pending OR finidngs. Hgb 9's, no transfusion at this point.    Likely admit post-op for bladder irrigation. Risks, benefits, alternatives discussed.   Last meal 10 hours ago, some water only about 4 hours ago.      Alexis Frock, Rivers 06/13/2015, 12:38 AM

## 2015-06-13 NOTE — Op Note (Signed)
NAMEMarland Kitchen  ANTONIOS, TINTLE NO.:  [id number]  MEDICAL RECORD NO.:  [id number]  LOCATION:  [facility]                         FACILITY:  [facility]  PHYSICIAN:  Alexis Frock, MD     DATE OF BIRTH:  1930/07/08  DATE OF PROCEDURE: 06/13/2015                              OPERATIVE REPORT  PREOPERATIVE DIAGNOSES: 1. History of prostate cancer. 2. Recurrent gross hematuria with clot retention. 3. History of bladder cancer.  POSTOPERATIVE DIAGNOSES: 1. History of prostate cancer. 2. Recurrent gross hematuria with clot retention. 3. History of bladder cancer. 4. Radiation cystitis changes. 5. Small bladder stone.  PROCEDURE: 1. Cystoscopy with fulguration of bleeders and clot evacuation. 2. Transurethral resection of the prostate, regrowth. 3. Cystolitholapaxy, stone less than 2 cm.  ESTIMATED BLOOD LOSS:  Approximately 50 mL of old clot.  Approximately 100 mL of new blood.  SPECIMEN: 1. Bladder stone for composition analysis. 2. Prostate chips for permanent pathology.  FINDINGS: 1. Small volume formed clot in urinary bladder. 2. Friable mucosal bleeding of the bladder neck and prostate,     consistent with radiation cystitis changes. 3. Small bladder stone and left greater than right prostatic urethral     obstruction. 4. Complete resolution of all obstructing prostate tissue and ablation     of friable tissue, following above procedure. 5. Successful removal of small bladder stone and direct catheter 22-     [place] 3-way hematuria catheter to normal saline irrigation 1 drop     per second, efflux light pink, 20 mL sterile water in the balloon.  INDICATION:  Mr. Tessmann is a pleasant and quite vigorous 82 year old gentleman with a history of prostate cancer and bladder cancer.  He has had a recent several-month episodes of recurrent gross hematuria.  This first started in February while he was on a cruise, which he tends to take several times a year.  He has been  managed on and off with catheter drainage during this time.  He underwent office cystoscopy, which revealed likely radiation cystitis changes to his prostatic fossa.  No obvious recurrence of bladder tumor.  He unfortunately re-developed clot retention, presented to the ER early this evening with above symptoms. Options were discussed including continued catheter irrigation versus operative intervention with cysto fulguration of bleeders and he wished to proceed with the latter.  Notably the patient's INR is 1.8.  It was felt to be acceptable for this.  Informed consent was obtained and placed in medical record.  PROCEDURE IN DETAIL:  The patient being Elizardo Hsiung, was verified. Procedure being cysto with fulguration was confirmed.  Procedure was carried out.  Time-out was performed.  Intravenous antibiotics administered.  General LMA anesthesia was introduced.  The patient was placed into a low lithotomy position and sterile field was created by prepping the patient's penis, perineum, proximal thighs using iodine x3. Next, cystourethroscopy was performed using a 26-French resectoscope sheath with visual obturator.  Inspection of the anterior and posterior urethra revealed friable prostatic hypertrophy, left greater than right. This was felt to be mostly consistent with radiation cystitis changes. Inspection of the bladder neck revealed a small, somewhat impacted stone, approximately 1 cm in diameter in the intertrigonal ridge.  Photo documentation performed.  There were also some mild radiation cystitis changes at the bladder neck.  There was not large volume clot within the urinary bladder, approximately 50 mL of formed clot, this was irrigated for discard.  The resectoscope large loop was then used to dislodge the bladder stone at the intertrigonal ridge which was then irrigated and set aside for compositional analysis.  Estimated stone size 1 cm.  Next, fulguration current was  applied to the area of the bladder neck of the friable radiation cystitis change tissue, taking great care to avoid injury to the ureteral orifices which were prospectively identified. Similarly fulguration current was applied to the entire prostatic fossa, on the right side.  On the left side, there was some intraluminal obstructive tissue and this was resected, estimated only about 10 g of tissue or so, and the base of this was fulgurated.  Following these maneuvers, excellent hemostasis with complete resolution of bladder stone and all obstructing tissue as well as all friable radiation cystitis change tissue within the bladder neck and prostatic urethra. We had achieved the goals of procedure today.  The resectoscope was then exchanged for a 22-French 3-way hematuria catheter, 20 mL sterile water in the balloon.  This was connected to normal saline irrigation and the procedure terminated.  The patient tolerated the procedure well.  There were no immediate periprocedural complications.  The patient was taken to the postanesthesia care in stable condition with plan for observation admission and holding his Coumadin.          ______________________________ Alexis Frock, MD     TM/MEDQ  D:  06/13/2015  T:  06/13/2015  Job:  [id number]

## 2015-06-14 DIAGNOSIS — N401 Enlarged prostate with lower urinary tract symptoms: Secondary | ICD-10-CM | POA: Diagnosis not present

## 2015-06-14 LAB — GLUCOSE, CAPILLARY
GLUCOSE-CAPILLARY: 100 mg/dL — AB (ref 65–99)
GLUCOSE-CAPILLARY: 153 mg/dL — AB (ref 65–99)

## 2015-06-14 LAB — HEMOGLOBIN AND HEMATOCRIT, BLOOD
HEMATOCRIT: 27.2 % — AB (ref 39.0–52.0)
Hemoglobin: 8.4 g/dL — ABNORMAL LOW (ref 13.0–17.0)

## 2015-06-14 MED ORDER — TRAMADOL HCL 50 MG PO TABS: 50.0000 mg | ORAL_TABLET | Freq: Four times a day (QID) | ORAL | Status: DC | PRN

## 2015-06-14 MED ORDER — CEPHALEXIN 500 MG PO CAPS: 500.0000 mg | ORAL_CAPSULE | Freq: Two times a day (BID) | ORAL | Status: DC

## 2015-06-14 NOTE — Discharge Instructions (Signed)
1 - You may have urinary urgency (bladder spasms) and bloody urine on / off with catheter in place. This is normal. ° °2 - Call MD or go to ER for fever >102, severe pain / nausea / vomiting not relieved by medications, or acute change in medical status ° °

## 2015-06-14 NOTE — Care Management Obs Status (Signed)
[facility] NOTIFICATION   Patient Details  Name: Eric Rivers MRN: [id number] Date of Birth: 1931/01/23   Medicare Observation Status Notification Given:  Yes    Dellie Catholic, RN 06/14/2015, 3:08 PM

## 2015-06-14 NOTE — Discharge Summary (Signed)
Physician Discharge Summary  Patient ID: Eric Rivers MRN: [id number] DOB/AGE: 06/28/1930 82 y.o.  Admit date: 06/12/2015 Discharge date: 06/14/2015  Admission Diagnoses: Hematuria, Urinary Retenion  Discharge Diagnoses:  Active Problems:   Hematuria   Discharged Condition: good  Hospital Course:   1 - Hematuria with Clot Retention - long h/o intermittent hematuria from radiation cystitis / prostatitis in setting of coumadin use. Develop frank retention and underwent urgent admission and operative cystoscopy, clot evacution, removal of small bladder stone, and TURP without acute complications. He was given trial of void 4/13 but did not void after 8 hours therefore catheter replaced and felt to be adequate for discharge with catheter. He will hold coumadin at discharge as he has rate-controlled AFib w/o h/o CVA / emboli.  Consults: None  Significant Diagnostic Studies: labs: Hgb 8.4 / stable, Surgical pathology - benign prostate  Treatments: surgery:  cystoscopy, clot evacution, removal of small bladder stone, and TURP 06/13/2015  Discharge Exam: Blood pressure 116/53, pulse 78, temperature 97.9 F (36.6 C), temperature source Oral, resp. rate 18, height 5\' 8"  (1.727 m), weight 100 kg (220 lb 7.4 oz), SpO2 98 %. General appearance: alert, cooperative and appears stated age Eyes: negative Nose: Nares normal. Septum midline. Mucosa normal. No drainage or sinus tenderness. Throat: lips, mucosa, and tongue normal; teeth and gums normal Neck: supple, symmetrical, trachea midline Back: symmetric, no curvature. ROM normal. No CVA tenderness. Resp: non-labored on room air Cardio: Nl rate GI: soft, non-tender; bowel sounds normal; no masses,  no organomegaly Male genitalia: normal, foley c/d/i wtih clear yellow urine Extremities: extremities normal, atraumatic, no cyanosis or edema Lymph nodes: Cervical, supraclavicular, and axillary nodes normal. Neurologic: Grossly  normal  Disposition: 01-Home or Self Care     Medication List    STOP taking these medications        warfarin 4 MG tablet  Commonly known as:  COUMADIN      TAKE these medications        acetaminophen 500 MG tablet  Commonly known as:  TYLENOL  Take 1,000 mg by mouth 3 (three) times daily as needed for mild pain, moderate pain, fever or headache.     allopurinol 300 MG tablet  Commonly known as:  ZYLOPRIM  take 1 tablet by mouth once daily     amLODipine 2.5 MG tablet  Commonly known as:  NORVASC  Take 1 tablet (2.5 mg total) by mouth daily.     aspirin EC 325 MG tablet  Take 325 mg by mouth daily.     atorvastatin 20 MG tablet  Commonly known as:  LIPITOR  take 1 tablet by mouth every morning     CALCIUM 600 + D PO  Take 1 tablet by mouth at bedtime.     cephALEXin 500 MG capsule  Commonly known as:  KEFLEX  Take 1 capsule (500 mg total) by mouth 2 (two) times daily. X 5 days to prevent post-op infection with catheter in place     desonide 0.05 % lotion  Commonly known as:  DESOWEN  Apply 1 application topically daily as needed (rosacea).     finasteride 5 MG tablet  Commonly known as:  PROSCAR  Take 1 tablet (5 mg total) by mouth every morning.     furosemide 40 MG tablet  Commonly known as:  LASIX  Take 40 mg by mouth daily as needed for fluid or edema.     JANUVIA 100 MG tablet  Generic drug:  sitaGLIPtin  take 1 tablet by mouth once daily     ketoconazole 2 % shampoo  Commonly known as:  NIZORAL  Apply 1 application topically 4 (four) times a week.     ONCOVITE PO  Take 1 tablet by mouth daily.     pantoprazole 40 MG tablet  Commonly known as:  PROTONIX  take 1 tablet by mouth once daily     potassium chloride 10 MEQ tablet  Commonly known as:  K-DUR,KLOR-CON  Take 1 tablet (10 mEq total) by mouth daily.     senna 8.6 MG tablet  Commonly known as:  SENOKOT  Take 1 tablet by mouth at bedtime.     tamsulosin 0.4 MG Caps capsule   Commonly known as:  FLOMAX  Take 0.4 mg by mouth at bedtime.     traMADol 50 MG tablet  Commonly known as:  ULTRAM  Take 1 tablet (50 mg total) by mouth every 6 (six) hours as needed for moderate pain. Post-operatively     traZODone 50 MG tablet  Commonly known as:  DESYREL  take 1/2  TO ONE tablet by mouth at bedtime if needed for sleep         SignedTresa Moore, Zakayla Martinec 06/14/2015, 4:12 PM

## 2015-06-18 ENCOUNTER — Telehealth: Payer: Self-pay | Admitting: Cardiology

## 2015-06-18 DIAGNOSIS — N210 Calculus in bladder: Secondary | ICD-10-CM | POA: Diagnosis not present

## 2015-06-18 NOTE — Telephone Encounter (Signed)
New Message  Pt states that he was taken off of his coumadin By Dr. Lorn Junes with [facility]. This is in connection with the bleeding bladder that has been corrected. Pt states that he was advised to continue not taking this medication until 07/03/2015. Please call back to discuss.

## 2015-06-18 NOTE — Telephone Encounter (Signed)
Pt calling to give Dr Meda Coffee an update on his bladder issues and being off of coumadin since 06/13/15:  Pt states that he went to the ER last week for noted blood in his urine/catheter.  Pt states that he was admitted to the hospital by Dr Tresa Moore with [facility], for a surgical procedure to stop the blood in his bladder/urine.  Pt states that Dr Tresa Moore went into the pts bladder and found where the bleeding source was coming from, and cauterized this.  Pt states that he stayed in the hospital for a few days, with anticipation to be discharged home without a cath in place.  Pt states that the bleeding did stop from the procedure, but on the day he was to be discharged home, they removed his catheter, but held him for a bit, with anticipation that he would pass some urine.  Pt states that there was no urine return, so they placed another catheter in, and sent him home with coumadin discontinued.  Pt states that Dr Tresa Moore d/c'ed his coumadin last Wednesday 4/12, and does not anticipate the pt to start this back any time soon.  Pt states that Dr Tresa Moore took him off of coumadin, for this exacerbated his bleeding in his urine.  Pt states that Dr Tresa Moore "is opposed to the pt being back on coumadin, but will possibly consider having him restart this back, at his 2nd appt with him on 07/03/15." Pt states that he does have an appt with Dr Zettie Pho NP today at 2pm.  Pt states they will re-evaluate him and reassess his cath and urine.  Pt states that Dr Tresa Moore does stand firm with him remaining off of coumadin, until possibly 5/2 appt.  Pt states that he just wanted to inform Dr Meda Coffee of this, for he was on coumadin for prevention of stroke, associated with his cardiac issues.  Informed the pt that Dr Meda Coffee is currently out of the office today, but I will route this message to her for further review and recommendation if any.  Advised the pt to call us back with an update on his appt today with [facility].   Pt verbalized understanding and agrees with this plan.

## 2015-06-19 ENCOUNTER — Encounter (HOSPITAL_COMMUNITY): Payer: Self-pay | Admitting: Emergency Medicine

## 2015-06-19 ENCOUNTER — Emergency Department (HOSPITAL_COMMUNITY)
Admission: EM | Admit: 2015-06-19 | Discharge: 2015-06-19 | Disposition: A | Discharge status: Home / Self Care | Payer: Medicare Other | Attending: Emergency Medicine | Admitting: Emergency Medicine

## 2015-06-19 DIAGNOSIS — I10 Essential (primary) hypertension: Secondary | ICD-10-CM | POA: Insufficient documentation

## 2015-06-19 DIAGNOSIS — Z8551 Personal history of malignant neoplasm of bladder: Secondary | ICD-10-CM | POA: Insufficient documentation

## 2015-06-19 DIAGNOSIS — E7800 Pure hypercholesterolemia, unspecified: Secondary | ICD-10-CM | POA: Insufficient documentation

## 2015-06-19 DIAGNOSIS — Z7982 Long term (current) use of aspirin: Secondary | ICD-10-CM | POA: Diagnosis not present

## 2015-06-19 DIAGNOSIS — E1140 Type 2 diabetes mellitus with diabetic neuropathy, unspecified: Secondary | ICD-10-CM | POA: Diagnosis not present

## 2015-06-19 DIAGNOSIS — Z87891 Personal history of nicotine dependence: Secondary | ICD-10-CM | POA: Diagnosis not present

## 2015-06-19 DIAGNOSIS — M109 Gout, unspecified: Secondary | ICD-10-CM | POA: Diagnosis not present

## 2015-06-19 DIAGNOSIS — K219 Gastro-esophageal reflux disease without esophagitis: Secondary | ICD-10-CM | POA: Insufficient documentation

## 2015-06-19 DIAGNOSIS — R300 Dysuria: Secondary | ICD-10-CM | POA: Diagnosis not present

## 2015-06-19 DIAGNOSIS — Z8546 Personal history of malignant neoplasm of prostate: Secondary | ICD-10-CM | POA: Diagnosis not present

## 2015-06-19 DIAGNOSIS — I4891 Unspecified atrial fibrillation: Secondary | ICD-10-CM | POA: Insufficient documentation

## 2015-06-19 DIAGNOSIS — R109 Unspecified abdominal pain: Secondary | ICD-10-CM | POA: Insufficient documentation

## 2015-06-19 DIAGNOSIS — Z8781 Personal history of (healed) traumatic fracture: Secondary | ICD-10-CM | POA: Diagnosis not present

## 2015-06-19 DIAGNOSIS — Z7984 Long term (current) use of oral hypoglycemic drugs: Secondary | ICD-10-CM | POA: Insufficient documentation

## 2015-06-19 DIAGNOSIS — E785 Hyperlipidemia, unspecified: Secondary | ICD-10-CM | POA: Diagnosis not present

## 2015-06-19 DIAGNOSIS — R39198 Other difficulties with micturition: Secondary | ICD-10-CM

## 2015-06-19 NOTE — ED Notes (Signed)
Pt reported he had about 5 episodes of urination since arrival in the ER

## 2015-06-19 NOTE — ED Notes (Signed)
Bladder scan results: 156ML

## 2015-06-19 NOTE — ED Notes (Addendum)
Pt BIB EMS from home for complaints of urinary retention; pt had catheter placed last week for a procedure to cauterize bleeding coming from pt's bladder; pt saw PCP today to get catheter removed and has not been able to urinate since; pt c/o groin pain when having urge to urinate; pt has not taken his daily diuretic today; pt has hx of bladder cancer.

## 2015-06-19 NOTE — ED Notes (Signed)
Bed: WA03 Expected date:  Expected time:  Means of arrival:  Comments: Urinary retention

## 2015-06-19 NOTE — ED Provider Notes (Signed)
CSN: [id number]     Arrival date & time 06/19/15  0021 History   First MD Initiated Contact with Patient 06/19/15 0216     Chief Complaint  Patient presents with  . Urinary Retention     (Consider location/radiation/quality/duration/timing/severity/associated sxs/prior Treatment) HPI Comments: 82 year old male with a history of atrial fibrillation, on chronic Coumadin, diabetes mellitus, hypertension, aortic stenosis, aortic dissection status post surgical repair, as well as bladder and prostate cancer presents to the emergency department for difficulty voiding. Patient with history of cystoscopy with fulguration and clot evacuation as well as bladder stone removal on 06/13/2015 by Dr. Tresa Moore. He was unable to pass a bladder challenge when in the hospital and was discharged with a Foley catheter. He reports going to his urologist's office yesterday to have his catheter removed. He states that he has not been able to void since his catheter was taken out. He reports worsening suprapubic pressure initially with urinary urgency. He states he tried to void and only "a couple of drops would come out".  Since arrival in the emergency department, however, patient states that his symptoms have resolved. He has voided 8-10 times in the past hour. She denies hematuria. Patient states that he is off of his Coumadin until 07/03/2015, per Dr. Zettie Pho request. This has been cleared by the patient's cardiologist, Dr. Meda Coffee.  The history is provided by the patient. No language interpreter was used.    Past Medical History  Diagnosis Date  . Whooping cough   . Atrial fibrillation (HCC)      Chronic,   24 hour holter, September, 2011.... atrial fib rate is controlled.... there is some bradycardia but  no marked pauses  . Diabetes mellitus     type 2  . Hypertension   . DDD (degenerative disc disease)     lumbar spine  . GERD (gastroesophageal reflux disease)   . Gout   . Fluid overload   . ACE-inhibitor  cough   . Shortness of breath     on exertion  . Aortic stenosis     mild....echo... september... 2010/ mild... echo.[name] KitchenMarland KitchenDec, 2011  . Pulmonary hypertension ([place])     23mmHg, echo, 01/2010  . Hyperlipidemia   . Hx of colonoscopy     approx. 10 years  . Warfarin anticoagulation     Atrial fib  . Ejection fraction     EF 60%, echo, 01/2010  . Normal nuclear stress test     06/2005 , also ABI normal 2007  . Hypercholesterolemia   . Fracture of metacarpal bone 11/30/11  . Aortic dissection [facility])     Surgical repair February, 2014  . Mitral regurgitation   . Diabetes mellitus with neuropathy ([place]) 12/04/2006    Qualifier: Diagnosis of  By: Linda Hedges MD, Heinz Knuckles  medications - DPP4   . Bladder cancer [facility])     recurrence with TUR-B March '09  . Prostate cancer ([place])     radiation tx.  . Diverticulosis of colon with hemorrhage 01/01/2014   Past Surgical History  Procedure Laterality Date  . Lumbar laminectomy      '02  . Tur-bt '06, '09    . Bladder cancer biopsy  10/16/2010    negative for malignancy  . Bladder microscopic  04/13/2007    high grade papillary urothelial lesions  . 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 Prostate biopsy  09/25/2011    GLEASON 3+3=6 AND 4+3=7  . Ascending aortic  root replacement N/A 04/15/2012    Procedure: ASCENDING AORTIC ROOT REPLACEMENT;  Surgeon: Gaye Pollack, MD;  Location: [place] OR;  Service: Open Heart Surgery;  Laterality: N/A;  . Tee without cardioversion N/A 04/15/2012    Procedure: TRANSESOPHAGEAL ECHOCARDIOGRAM (TEE);  Surgeon: Gaye Pollack, MD;  Location: [place];  Service: Open Heart Surgery;  Laterality: N/A;  . Exploration post operative open heart  04/2012  . Cystoscopy/retrograde/ureteroscopy Bilateral 04/04/2013    Procedure: CYSTOSCOPY WITH RETROGRADE PYELOGRAM AND BLADDER BIOPSY;  Surgeon: Molli Hazard, MD;  Location: [facility];  Service: Urology;  Laterality: Bilateral;  . Herniatic disc  surgery    . Colonoscopy N/A 01/01/2014    Procedure: COLONOSCOPY;  Surgeon: Gatha Mayer, MD;  Location: [facility];  Service: Endoscopy;  Laterality: N/A;  . Transurethral resection of bladder tumor N/A 06/13/2015    Procedure: TRANSURETHRAL RESECTION OF PROSTATE (TURP) CLOT EVACUATION AND FULGERATION, BLADDER STONE REMOVAL ;  Surgeon: Alexis Frock, MD;  Location: [facility];  Service: Urology;  Laterality: N/A;   Family History  Problem Relation Age of Onset  . Prostate cancer Father 4    passed with prostate ca  . Cancer Father   . Stroke Father   . Heart disease Mother   . Hypertension Mother   . Heart attack Mother    Social History  Substance Use Topics  . Smoking status: Former Smoker -- 1.00 packs/day    Types: Cigarettes    Quit date: 03/04/1975  . Smokeless tobacco: Never Used  . Alcohol Use: 7.0 oz/week    14 drink(s) per week     Comment: liquor daily    Review of Systems  Constitutional: Negative for fever.  Gastrointestinal: Positive for abdominal pain. Negative for nausea and vomiting.  Genitourinary: Positive for difficulty urinating.  All other systems reviewed and are negative.   Allergies  Metoprolol  Home Medications   Prior to Admission medications   Medication Sig Start Date End Date Taking? Authorizing Provider  acetaminophen (TYLENOL) 500 MG tablet Take 1,000 mg by mouth 3 (three) times daily as needed for mild pain, moderate pain, fever or headache.    Yes Historical Provider, MD  allopurinol (ZYLOPRIM) 300 MG tablet take 1 tablet by mouth once daily Patient taking differently: Take 300 mg by mouth once daily 01/15/15  Yes Biagio Borg, MD  amLODipine (NORVASC) 2.5 MG tablet Take 1 tablet (2.5 mg total) by mouth daily. 07/17/14  Yes Carlena Bjornstad, MD  aspirin EC 325 MG tablet Take 325 mg by mouth daily.   Yes Historical Provider, MD  atorvastatin (LIPITOR) 20 MG tablet take 1 tablet by mouth every morning Patient taking differently: Take 20 mg  by mouth every morning 07/17/14  Yes Biagio Borg, MD  Calcium Carb-Cholecalciferol (CALCIUM 600 + D PO) Take 1 tablet by mouth at bedtime.    Yes Historical Provider, MD  cephALEXin (KEFLEX) 500 MG capsule Take 1 capsule (500 mg total) by mouth 2 (two) times daily. X 5 days to prevent post-op infection with catheter in place 06/14/15  Yes Alexis Frock, MD  desonide (DESOWEN) 0.05 % lotion Apply 1 application topically daily as needed (rosacea).    Yes Historical Provider, MD  finasteride (PROSCAR) 5 MG tablet Take 1 tablet (5 mg total) by mouth every morning. 12/23/13  Yes Biagio Borg, MD  furosemide (LASIX) 40 MG tablet Take 40 mg by mouth daily as needed for fluid or edema.    Yes Historical  Provider, MD  JANUVIA 100 MG tablet take 1 tablet by mouth once daily Patient taking differently: Take 100 mg by mouth once daily 01/01/15  Yes Biagio Borg, MD  ketoconazole (NIZORAL) 2 % shampoo Apply 1 application topically 4 (four) times a week.   Yes Historical Provider, MD  Multiple Vitamins-Minerals (ONCOVITE PO) Take 1 tablet by mouth daily.    Yes Historical Provider, MD  pantoprazole (PROTONIX) 40 MG tablet take 1 tablet by mouth once daily Patient taking differently: Take 40 mg by mouth once daily 07/17/14  Yes Biagio Borg, MD  potassium chloride (K-DUR,KLOR-CON) 10 MEQ tablet Take 1 tablet (10 mEq total) by mouth daily. 07/14/14  Yes Carlena Bjornstad, MD  senna (SENOKOT) 8.6 MG tablet Take 1 tablet by mouth at bedtime.    Yes Historical Provider, MD  Tamsulosin HCl (FLOMAX) 0.4 MG CAPS Take 0.4 mg by mouth at bedtime.    Yes Historical Provider, MD  traMADol (ULTRAM) 50 MG tablet Take 1 tablet (50 mg total) by mouth every 6 (six) hours as needed for moderate pain. Post-operatively 06/14/15  Yes Alexis Frock, MD  traZODone (DESYREL) 50 MG tablet take 1/2  TO ONE tablet by mouth at bedtime if needed for sleep Patient taking differently: TAKE 25 TO 50 MG BY MOUTH AT BEDTIME AS NEEDED FOR SLEEP 03/06/15   Yes Biagio Borg, MD   BP 138/84 mmHg  Pulse 77  Temp(Src) 98.2 F (36.8 C) (Oral)  Resp 20  SpO2 94%   Physical Exam  Constitutional: He is oriented to person, place, and time. He appears well-developed and well-nourished. No distress.  HENT:  Head: Normocephalic and atraumatic.  Eyes: Conjunctivae and EOM are normal. No scleral icterus.  Neck: Normal range of motion.  Cardiovascular: Normal rate, regular rhythm and intact distal pulses.   Pulmonary/Chest: Effort normal. No respiratory distress.  Respirations even and unlabored  Abdominal: Soft. He exhibits no distension. There is no tenderness. There is no rebound.  Soft, nontender abdomen. No masses or peritoneal signs.  Musculoskeletal: Normal range of motion.  Neurological: He is alert and oriented to person, place, and time. He exhibits normal muscle tone. Coordination normal.  Patient moving all extremities.  Skin: Skin is warm and dry. No rash noted. He is not diaphoretic. No erythema. No pallor.  Psychiatric: He has a normal mood and affect. His behavior is normal.  Nursing note and vitals reviewed.   ED Course  Procedures (including critical care time) Labs Review Labs Reviewed - No data to display  Imaging Review No results found. I have personally reviewed and evaluated these images and lab results as part of my medical decision-making.   EKG Interpretation None      MDM   Final diagnoses:  Difficulty urinating    Patient presenting for inability to void after removal of his Foley catheter. Symptoms have spontaneously resolved since arrival in the emergency department. Patient has a soft and nontender abdomen. He has voided 8-10 times without hematuria. Bladder scan shows residual void of 156 mL. No indication for further emergent workup at this time. Patient stable for discharge with instruction to see his urologist as needed. Return precautions given at discharge. Patient agreeable to plan with no  unaddressed concerns; discharged in satisfactory condition.   Filed Vitals:   06/19/15 0030 06/19/15 0137  BP: 149/89 138/84  Pulse: 72 77  Temp: 98.2 F (36.8 C)   TempSrc: Oral   Resp: 20   SpO2: 95% 94%  Antonietta Breach, PA-C [id number]  Delora Fuel, MD [id number]

## 2015-06-19 NOTE — Discharge Instructions (Signed)
Acute Urinary Retention, Male °Acute urinary retention is the temporary inability to urinate. °This is a common problem in older men. As men age their prostates become larger and block the flow of urine from the bladder. This is usually a problem that has come on gradually.  °HOME CARE INSTRUCTIONS °If you are sent home with a Foley catheter and a drainage system, you will need to discuss the best course of action with your health care provider. While the catheter is in, maintain a good intake of fluids. Keep the drainage bag emptied and lower than your catheter. This is so that contaminated urine will not flow back into your bladder, which could lead to a urinary tract infection. °There are two main types of drainage bags. One is a large bag that usually is used at night. It has a good capacity that will allow you to sleep through the night without having to empty it. The second type is called a leg bag. It has a smaller capacity, so it needs to be emptied more frequently. However, the main advantage is that it can be attached by a leg strap and can go underneath your clothing, allowing you the freedom to move about or leave your home. °Only take over-the-counter or prescription medicines for pain, discomfort, or fever as directed by your health care provider.  °SEEK MEDICAL CARE IF: °· You develop a low-grade fever. °· You experience spasms or leakage of urine with the spasms. °SEEK IMMEDIATE MEDICAL CARE IF:  °· You develop chills or fever. °· Your catheter stops draining urine. °· Your catheter falls out. °· You start to develop increased bleeding that does not respond to rest and increased fluid intake. °MAKE SURE YOU: °· Understand these instructions. °· Will watch your condition. °· Will get help right away if you are not doing well or get worse. °  °This information is not intended to replace advice given to you by your health care provider. Make sure you discuss any questions you have with your health care  provider. °  °Document Released: 05/26/2000 Document Revised: 07/04/2014 Document Reviewed: 07/29/2012 °Elsevier Interactive Patient Education ©2016 [employer] ° °

## 2015-06-25 ENCOUNTER — Telehealth: Payer: Self-pay | Admitting: Cardiology

## 2015-06-25 NOTE — Telephone Encounter (Addendum)
Called patient back about his message. Patient has recently had bleeding in his urine which resulted in patient having a cystoscopy with fulguration and clot evacuation, and removal of bladder stone, by his urologist Dr. Tresa Moore. Patient was told to hold his coumadin on 06/13/15 and directed to stay off until 07/03/15, when patient sees urologist. Patient had a foley cath. After foley cath was removed patient stated he was not urinating. Foley cath was put back in for a second time, and removed for about a week ago. Patient stated since he has been having SOB with activity, urinary frequency, urinary retention, and some swelling in feet. Patient is alert and oriented, no confusion, and no signs and symptoms of a stroke. Patient denies any blood in urine, dizziness, weakness, or chest pain. Patient does not check his daily weight, and has not been keeping to a low salt diet. Patient called his urology office and they referred him to his cardiologist. Patient's BP 152/88 and HR 88. Encouraged patient to take his PRN lasix for now. Will consult DOD/FLEX for advisement.   Dr. Meda Coffee advised patient to come in and see a PA for evaluation, and he would need lab work. Patient verbalized understanding and will come in tomorrow to see Richardson Dopp PA at 11:00 am. Patient will give our office a call if he cannot make this appointment, due to needing a ride.

## 2015-06-25 NOTE — Telephone Encounter (Signed)
Pt c/o Shortness Of Breath: STAT if SOB developed within the last 24 hours or pt is noticeably SOB on the phone  1. Are you currently SOB (can you hear that pt is SOB on the phone)? NO 2. How long have you been experiencing SOB? 10 DAYS OR MORE  3. Are you SOB when sitting or when up moving around? up moving around  4. Are you currently experiencing any other symptoms? NO

## 2015-06-26 ENCOUNTER — Encounter: Payer: Self-pay | Admitting: Physician Assistant

## 2015-06-26 ENCOUNTER — Ambulatory Visit (INDEPENDENT_AMBULATORY_CARE_PROVIDER_SITE_OTHER): Payer: Medicare Other | Admitting: Physician Assistant

## 2015-06-26 VITALS — BP 120/60 | HR 80 | Ht 68.0 in | Wt 224.4 lb

## 2015-06-26 DIAGNOSIS — I10 Essential (primary) hypertension: Secondary | ICD-10-CM

## 2015-06-26 DIAGNOSIS — I5032 Chronic diastolic (congestive) heart failure: Secondary | ICD-10-CM

## 2015-06-26 DIAGNOSIS — I482 Chronic atrial fibrillation, unspecified: Secondary | ICD-10-CM

## 2015-06-26 DIAGNOSIS — R0602 Shortness of breath: Secondary | ICD-10-CM

## 2015-06-26 DIAGNOSIS — I4891 Unspecified atrial fibrillation: Secondary | ICD-10-CM | POA: Diagnosis not present

## 2015-06-26 DIAGNOSIS — I350 Nonrheumatic aortic (valve) stenosis: Secondary | ICD-10-CM

## 2015-06-26 DIAGNOSIS — I5033 Acute on chronic diastolic (congestive) heart failure: Secondary | ICD-10-CM

## 2015-06-26 DIAGNOSIS — I509 Heart failure, unspecified: Secondary | ICD-10-CM | POA: Diagnosis not present

## 2015-06-26 DIAGNOSIS — I7101 Dissection of ascending aorta: Secondary | ICD-10-CM

## 2015-06-26 LAB — CBC WITH DIFFERENTIAL/PLATELET
Basophils Absolute: 0 {cells}/uL (ref 0–200)
Basophils Relative: 0 %
Eosinophils Absolute: 188 {cells}/uL (ref 15–500)
Eosinophils Relative: 2 %
HCT: 30.5 % — ABNORMAL LOW (ref 38.5–50.0)
Hemoglobin: 9 g/dL — ABNORMAL LOW (ref 13.2–17.1)
Lymphocytes Relative: 10 %
Lymphs Abs: 940 {cells}/uL (ref 850–3900)
MCH: 24 pg — ABNORMAL LOW (ref 27.0–33.0)
MCHC: 29.5 g/dL — ABNORMAL LOW (ref 32.0–36.0)
MCV: 81.3 fL (ref 80.0–100.0)
MPV: 8.5 fL (ref 7.5–12.5)
Monocytes Absolute: 940 {cells}/uL (ref 200–950)
Monocytes Relative: 10 %
Neutro Abs: 7332 {cells}/uL (ref 1500–7800)
Neutrophils Relative %: 78 %
Platelets: 336 K/uL (ref 140–400)
RBC: 3.75 MIL/uL — ABNORMAL LOW (ref 4.20–5.80)
RDW: 17.1 % — ABNORMAL HIGH (ref 11.0–15.0)
WBC: 9.4 K/uL (ref 3.8–10.8)

## 2015-06-26 LAB — BASIC METABOLIC PANEL WITH GFR
BUN: 18 mg/dL (ref 7–25)
CO2: 27 mmol/L (ref 20–31)
Calcium: 9.2 mg/dL (ref 8.6–10.3)
Chloride: 104 mmol/L (ref 98–110)
Creat: 1.08 mg/dL (ref 0.70–1.11)
Glucose, Bld: 97 mg/dL (ref 65–99)
Potassium: 4.1 mmol/L (ref 3.5–5.3)
Sodium: 141 mmol/L (ref 135–146)

## 2015-06-26 LAB — BRAIN NATRIURETIC PEPTIDE: Brain Natriuretic Peptide: 498.2 pg/mL — ABNORMAL HIGH

## 2015-06-26 NOTE — Progress Notes (Signed)
Cardiology Office Note:    Date:  06/26/2015   ID:  Roch, Druin 01-17-1931, MRN [id number]  PCP:  Cathlean Cower, MD  Cardiologist:  Dr. Ena Dawley   Electrophysiologist:  N/a Urologist:  Dr. Tresa Moore  Referring MD: Biagio Borg, MD   Chief Complaint  Patient presents with  . Shortness of Breath    History of Present Illness:     Eric Rivers is a 82 y.o. male with a hx of chronic AFib, aortic dissection s/p emergent repair in 2014 with reimplantation of coronary arteries, DM2, HTN, HL, diastolic HF, mild AS, mild MR, pulmonary HTN, chronic anticoagulation with Warfarin.  Last seen by Dr. Meda Coffee 04/06/15.   Patient has recently had some hematuria and has undergone bladder procedure by his urologist. Coumadin has been placed on hold. He called in yesterday with shortness of breath and edema. He was around for further evaluation.   He was admitted in late March with gross hematuria. He apparently required blood transfusion 1. Hemoglobin did drop into the 8 range. He was readmitted in mid April and underwent cystoscopy with clot evacuation and removal of a small bladder stone as well as TURP. He had issues with urinary retention. He had to have his Foley catheter replaced on one occasion. His catheter is now out. Since his problems with hematuria started, he started to notice worsening dyspnea on exertion. He's chronically NYHA 2-2b. He's currently NYHA 3. He sleeps in a recliner secondary to prior rib fracture. He denies PND. He has noted some increasing LE edema. He only takes Lasix as needed for worsening edema. He did take Lasix yesterday and noticed that his breathing was improved last night. He denies chest pain or syncope. Denies nausea or diaphoresis arm or jaw pain.    Past Medical History  Diagnosis Date  . Whooping cough   . Atrial fibrillation (HCC)      Chronic,   24 hour holter, September, 2011.... atrial fib rate is controlled.... there is some bradycardia but   no marked pauses  . Diabetes mellitus     type 2  . Hypertension   . DDD (degenerative disc disease)     lumbar spine  . GERD (gastroesophageal reflux disease)   . Gout   . Fluid overload   . ACE-inhibitor cough   . Shortness of breath     on exertion  . Aortic stenosis     mild....echo... september... 2010/ mild... echo.[name] KitchenMarland KitchenDec, 2011  . Pulmonary hypertension ([place])     80mmHg, echo, 01/2010  . Hyperlipidemia   . Hx of colonoscopy     approx. 10 years  . Warfarin anticoagulation     Atrial fib  . Ejection fraction     EF 60%, echo, 01/2010  . Normal nuclear stress test     06/2005 , also ABI normal 2007  . Hypercholesterolemia   . Fracture of metacarpal bone 11/30/11  . Aortic dissection [facility])     Surgical repair February, 2014  . Mitral regurgitation   . Diabetes mellitus with neuropathy ([place]) 12/04/2006    Qualifier: Diagnosis of  By: Linda Hedges MD, Heinz Knuckles  medications - DPP4   . Bladder cancer [facility])     recurrence with TUR-B March '09  . Prostate cancer ([place])     radiation tx.  . Diverticulosis of colon with hemorrhage 01/01/2014    Past Surgical History  Procedure Laterality Date  . Lumbar laminectomy      '02  .  Tur-bt '06, '09    . Bladder cancer biopsy  10/16/2010    negative for malignancy  . Bladder microscopic  04/13/2007    high grade papillary urothelial lesions  . 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 Prostate biopsy  09/25/2011    GLEASON 3+3=6 AND 4+3=7  . Ascending aortic root replacement N/A 04/15/2012    Procedure: ASCENDING AORTIC ROOT REPLACEMENT;  Surgeon: Gaye Pollack, MD;  Location: [place] OR;  Service: Open Heart Surgery;  Laterality: N/A;  . Tee without cardioversion N/A 04/15/2012    Procedure: TRANSESOPHAGEAL ECHOCARDIOGRAM (TEE);  Surgeon: Gaye Pollack, MD;  Location: [place];  Service: Open Heart Surgery;  Laterality: N/A;  . Exploration post operative open heart  04/2012  .  Cystoscopy/retrograde/ureteroscopy Bilateral 04/04/2013    Procedure: CYSTOSCOPY WITH RETROGRADE PYELOGRAM AND BLADDER BIOPSY;  Surgeon: Molli Hazard, MD;  Location: [facility];  Service: Urology;  Laterality: Bilateral;  . Herniatic disc surgery    . Colonoscopy N/A 01/01/2014    Procedure: COLONOSCOPY;  Surgeon: Gatha Mayer, MD;  Location: [facility];  Service: Endoscopy;  Laterality: N/A;  . Transurethral resection of bladder tumor N/A 06/13/2015    Procedure: TRANSURETHRAL RESECTION OF PROSTATE (TURP) CLOT EVACUATION AND FULGERATION, BLADDER STONE REMOVAL ;  Surgeon: Alexis Frock, MD;  Location: [facility];  Service: Urology;  Laterality: N/A;    Current Medications: Outpatient Prescriptions Prior to Visit  Medication Sig Dispense Refill  . acetaminophen (TYLENOL) 500 MG tablet Take 1,000 mg by mouth 3 (three) times daily as needed for mild pain, moderate pain, fever or headache.     . allopurinol (ZYLOPRIM) 300 MG tablet take 1 tablet by mouth once daily (Patient taking differently: Take 300 mg by mouth once daily) 90 tablet 1  . amLODipine (NORVASC) 2.5 MG tablet Take 1 tablet (2.5 mg total) by mouth daily. 90 tablet 3  . aspirin EC 325 MG tablet Take 325 mg by mouth daily.    [name] Kitchen atorvastatin (LIPITOR) 20 MG tablet take 1 tablet by mouth every morning (Patient taking differently: Take 20 mg by mouth every morning) 90 tablet 3  . Calcium Carb-Cholecalciferol (CALCIUM 600 + D PO) Take 1 tablet by mouth at bedtime.     [name] Kitchen desonide (DESOWEN) 0.05 % lotion Apply 1 application topically daily as needed (rosacea).     . finasteride (PROSCAR) 5 MG tablet Take 1 tablet (5 mg total) by mouth every morning. 90 tablet 3  . furosemide (LASIX) 40 MG tablet Take 40 mg by mouth daily as needed for fluid or edema.     [name] Kitchen JANUVIA 100 MG tablet take 1 tablet by mouth once daily (Patient taking differently: Take 100 mg by mouth once daily) 90 tablet 2  . ketoconazole (NIZORAL) 2 % shampoo Apply 1  application topically 4 (four) times a week.    . Multiple Vitamins-Minerals (ONCOVITE PO) Take 1 tablet by mouth daily.     . pantoprazole (PROTONIX) 40 MG tablet take 1 tablet by mouth once daily (Patient taking differently: Take 40 mg by mouth once daily) 90 tablet 3  . potassium chloride (K-DUR,KLOR-CON) 10 MEQ tablet Take 1 tablet (10 mEq total) by mouth daily. 90 tablet 1  . senna (SENOKOT) 8.6 MG tablet Take 1 tablet by mouth at bedtime.     . Tamsulosin HCl (FLOMAX) 0.4 MG CAPS Take 0.4 mg by mouth at bedtime.     . traZODone (DESYREL) 50  MG tablet take 1/2  TO ONE tablet by mouth at bedtime if needed for sleep (Patient taking differently: TAKE 25 TO 50 MG BY MOUTH AT BEDTIME AS NEEDED FOR SLEEP) 90 tablet 1  . cephALEXin (KEFLEX) 500 MG capsule Take 1 capsule (500 mg total) by mouth 2 (two) times daily. X 5 days to prevent post-op infection with catheter in place (Patient not taking: Reported on 06/26/2015) 10 capsule 0  . traMADol (ULTRAM) 50 MG tablet Take 1 tablet (50 mg total) by mouth every 6 (six) hours as needed for moderate pain. Post-operatively (Patient not taking: Reported on 06/26/2015) 20 tablet 0   No facility-administered medications prior to visit.     Allergies:   Metoprolol   Social History   Social History  . Marital Status: Single    Spouse Name: N/A  . Number of Children: 0  . Years of Education: 37   Occupational History  . history and Advice worker     retired   Social History Main Topics  . Smoking status: Former Smoker -- 1.00 packs/day    Types: Cigarettes    Quit date: 03/04/1975  . Smokeless tobacco: Never Used  . Alcohol Use: 7.0 oz/week    14 drink(s) per week     Comment: liquor daily  . Drug Use: No  . Sexual Activity: Not Currently   Other Topics Concern  . None   Social History Narrative   Confirmed batchelor. Retired Education officer, museum. World traveler- Ambulance person. I- ADLS. End of life Care   No CPR, no prolonged intubation, no  prolonged artificial hydration or feeding, no heroic or futile measures.       [place] Sept [place]13 [place]      Last cruise - two cruises back to back to the [place] in January '15. May'15: [place] with stops in [place], [place], [place], [place], [place]. 15 days      Fall '15: 50 Days from [place]. [place], [place], [place], [place], [place], [place], [place], [place], [place].       May '15 - 15 days up the [employer] as far as [place]              Family History:  The patient's family history includes Cancer in his father; Heart attack in his mother; Heart disease in his mother; Hypertension in his mother; Prostate cancer (age of onset: 19) in his father; Stroke in his father.   ROS:   Please see the history of present illness.    Review of Systems  Constitution: Positive for malaise/fatigue.  Cardiovascular: Positive for dyspnea on exertion.  Respiratory: Positive for shortness of breath.   Hematologic/Lymphatic: Bruises/bleeds easily.  Neurological: Positive for loss of balance.   All other systems reviewed and are negative.   Physical Exam:    VS:  BP 120/60 mmHg  Pulse 80  Ht 5\' 8"  (1.727 m)  Wt 224 lb 6.4 oz (101.787 kg)  BMI 34.13 kg/m2  SpO2 91%   GEN: Well nourished, well developed, in no acute distress HEENT: normal Neck: JVP 7-8 cm, no masses Cardiac: Normal S1/S2, irreg irreg rhythm; [id number] systolic murmur RUSB, 1+ bilat LE edema;     Respiratory:  clear to auscultation bilaterally; no wheezing, rhonchi or rales GI: soft, nontender, nondistended MS: no deformity or atrophy Skin: warm and dry Neuro: No focal deficits  Psych: Alert and oriented x 3, normal affect  Wt Readings from Last 3 Encounters:  06/26/15 224 lb 6.4 oz (101.787 kg)  06/13/15 220 lb 7.4 oz (100 kg)  06/03/15 229 lb 4.5 oz (104 kg)      Studies/Labs Reviewed:     EKG:  EKG is  ordered today.   The ekg ordered today demonstrates Atrial fibrillation, HR 71, poor R-wave progression, no change from prior tracing  Recent Labs: 04/10/2015: TSH 1.48 06/01/2015: ALT 15* 06/02/2015: B Natriuretic Peptide 431.3* 06/12/2015: BUN 24*; Creatinine, Ser 1.45*; Platelets 286; Potassium 4.5; Sodium 133* 06/14/2015: Hemoglobin 8.4*   Recent Lipid Panel    Component Value Date/Time   CHOL 102 04/10/2015 1230   TRIG 57.0 04/10/2015 1230   HDL 45.60 04/10/2015 1230   CHOLHDL 2 04/10/2015 1230   VLDL 11.4 04/10/2015 1230   LDLCALC 45 04/10/2015 1230    Additional studies/ records that were reviewed today include:   Chest CTA 9/16 IMPRESSION: 1. Stable appearance of ascending aortic repair, and persistent descending and abdominal aortic dissection flap as detailed above. 2. 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 3. Marked biatrial enlargement. Thrombus in the left atrial appendage is more conspicuous than on prior exam. 4. Extension of dissection flap through the main right renal artery and into the proximal left common iliac artery, stable compared to prior exam. 5. Cholelithiasis 6. Bladder calculus.  Echo 07/20/13 EF 55-60%, normal wall motion, Doppler parameters consistent with elevated mean LA filling pressure, mild aortic stenosis (mean 17 mmHg, peak 30 mmHg), mild LAE, moderately reduced RVSF, severe RAE, moderate TR, PASP 62 mmHg     ASSESSMENT:     1. Shortness of breath   2. Acute on chronic diastolic CHF (congestive heart failure) ([place])   3. Chronic atrial fibrillation ([place])   4. Essential hypertension   5. Aortic stenosis   6. Ascending aortic dissection (HCC)     PLAN:     In order of problems listed above:  1. Shortness of breath - His worsening dyspnea seems to be related to volume excess. He has evidence of volume overload on exam. I suspect that this is related to anemia from gross hematuria as well as volume resuscitation with PRBCs and IV fluids.   -   BMET, CBC, BNP today  -  Increase Lasix to 40 mg QD x 1 week, then resume Lasix 40 mg QD prn  -  BMET in 1 week  -  Repeat BMET.  -  FU in 2 weeks.  2.  Acute on Chronic diastolic CHF - As noted, his symptoms seem to be related to volume excess. Adjust Lasix as noted. Obtain follow-up echocardiogram as noted.  3. Chronic AFib - Rate controlled. CHADS2-VASc=4 (age, HTN, vascular dz).  Resume Coumadin when cleared by urology.     4. HTN - Controlled.   5. Aortic stenosis - Mild by Echo in 2015.  Repeat Echo as noted.   6. Aortic dissection - s/p repair in 2014.  Followed by [facility] (Dr. Cyndia Bent).      Medication Adjustments/Labs and Tests Ordered: Current medicines are reviewed at length with the patient today.  Concerns regarding medicines are outlined above.  Medication changes, Labs and Tests ordered today are outlined in the Patient Instructions noted below. Patient Instructions  Medication Instructions:  1. INCREASE LASIX TO 40 MG DAILY FOR 1 WEEK; THEN RESUME AS THE WAY YOU WERE TAKING LASIX BEFORE. 2. INCREASE POTASSIUM TO 10 MEQ DAILY FOR 1 WEEK THEN RESUME AS THE WAY  YOU WERE TAKING THE POTASSIUM BEFORE. Labwork: 1. TODAY BMET, CBC /DIFF, BNP 2. BMET TO BE DONE IN 1 WEEK Testing/Procedures: Your physician has requested that you have an echocardiogram. Echocardiography is a painless test that uses sound waves to create images of your heart. It provides your doctor with information about the size and shape of your heart and how well your heart's chambers and valves are working. This procedure takes approximately one hour. There are no restrictions for this procedure. Follow-Up: DR. Meda Coffee IN 2 WEEKS OR Milissa Fesperman, PAC SAME DAY DR. Meda Coffee IS IN THE OFFICE Any Other Special Instructions Will Be Listed Below (If Applicable). If you need a refill on your cardiac medications before your next appointment, please call your pharmacy.   Signed, Richardson Dopp, PA-C  06/26/2015 12:07 PM      [facility] [address], [place], [place]  [address] Phone: [phone]; Fax: [phone]

## 2015-06-26 NOTE — Patient Instructions (Addendum)
Medication Instructions:  1. INCREASE LASIX TO 40 MG DAILY FOR 1 WEEK; THEN RESUME AS THE WAY YOU WERE TAKING LASIX BEFORE. 2. INCREASE POTASSIUM TO 10 MEQ DAILY FOR 1 WEEK THEN RESUME AS THE WAY YOU WERE TAKING THE POTASSIUM BEFORE. Labwork: 1. TODAY BMET, CBC /DIFF, BNP 2. BMET TO BE DONE IN 1 WEEK Testing/Procedures: Your physician has requested that you have an echocardiogram. Echocardiography is a painless test that uses sound waves to create images of your heart. It provides your doctor with information about the size and shape of your heart and how well your heart's chambers and valves are working. This procedure takes approximately one hour. There are no restrictions for this procedure. Follow-Up: DR. Meda Coffee IN 2 WEEKS OR SCOTT WEAVER, PAC SAME DAY DR. Meda Coffee IS IN THE OFFICE Any Other Special Instructions Will Be Listed Below (If Applicable). If you need a refill on your cardiac medications before your next appointment, please call your pharmacy.

## 2015-06-27 ENCOUNTER — Other Ambulatory Visit: Payer: Self-pay | Admitting: *Deleted

## 2015-06-27 ENCOUNTER — Telehealth: Payer: Self-pay | Admitting: *Deleted

## 2015-06-27 MED ORDER — FUROSEMIDE 40 MG PO TABS: 40.0000 mg | ORAL_TABLET | Freq: Every day | ORAL | Status: DC | PRN

## 2015-06-27 NOTE — Telephone Encounter (Signed)
Pharmacist left msg on triage stating they have fax over refill request for furosemide over a week. Pt is currently out of meds requesting refill today! Sent electronically...Eric Rivers

## 2015-06-27 NOTE — Telephone Encounter (Signed)
Ptcb and has been notified of lab results by phone with verbal understanding. Pt also vrified echo 5/2 and PA f/u appt. 5/8.

## 2015-06-27 NOTE — Telephone Encounter (Signed)
Lmtcb to go over lab results 

## 2015-07-03 ENCOUNTER — Other Ambulatory Visit: Payer: Self-pay

## 2015-07-03 ENCOUNTER — Other Ambulatory Visit (INDEPENDENT_AMBULATORY_CARE_PROVIDER_SITE_OTHER): Payer: Medicare Other

## 2015-07-03 ENCOUNTER — Ambulatory Visit (HOSPITAL_COMMUNITY): Payer: Medicare Other | Attending: Internal Medicine

## 2015-07-03 DIAGNOSIS — Z0000 Encounter for general adult medical examination without abnormal findings: Secondary | ICD-10-CM | POA: Diagnosis not present

## 2015-07-03 DIAGNOSIS — E119 Type 2 diabetes mellitus without complications: Secondary | ICD-10-CM | POA: Diagnosis not present

## 2015-07-03 DIAGNOSIS — I110 Hypertensive heart disease with heart failure: Secondary | ICD-10-CM | POA: Insufficient documentation

## 2015-07-03 DIAGNOSIS — Z87891 Personal history of nicotine dependence: Secondary | ICD-10-CM | POA: Diagnosis not present

## 2015-07-03 DIAGNOSIS — I350 Nonrheumatic aortic (valve) stenosis: Secondary | ICD-10-CM | POA: Insufficient documentation

## 2015-07-03 DIAGNOSIS — E785 Hyperlipidemia, unspecified: Secondary | ICD-10-CM | POA: Diagnosis not present

## 2015-07-03 DIAGNOSIS — I340 Nonrheumatic mitral (valve) insufficiency: Secondary | ICD-10-CM | POA: Insufficient documentation

## 2015-07-03 DIAGNOSIS — R001 Bradycardia, unspecified: Secondary | ICD-10-CM | POA: Insufficient documentation

## 2015-07-03 DIAGNOSIS — I5033 Acute on chronic diastolic (congestive) heart failure: Secondary | ICD-10-CM

## 2015-07-03 DIAGNOSIS — I509 Heart failure, unspecified: Secondary | ICD-10-CM | POA: Diagnosis not present

## 2015-07-03 DIAGNOSIS — I071 Rheumatic tricuspid insufficiency: Secondary | ICD-10-CM | POA: Diagnosis not present

## 2015-07-03 DIAGNOSIS — D649 Anemia, unspecified: Secondary | ICD-10-CM | POA: Insufficient documentation

## 2015-07-03 DIAGNOSIS — R0602 Shortness of breath: Secondary | ICD-10-CM | POA: Diagnosis not present

## 2015-07-03 DIAGNOSIS — I272 Other secondary pulmonary hypertension: Secondary | ICD-10-CM | POA: Diagnosis not present

## 2015-07-03 DIAGNOSIS — I5031 Acute diastolic (congestive) heart failure: Secondary | ICD-10-CM | POA: Diagnosis present

## 2015-07-03 DIAGNOSIS — R310 Gross hematuria: Secondary | ICD-10-CM | POA: Diagnosis not present

## 2015-07-03 LAB — BASIC METABOLIC PANEL
BUN: 15 mg/dL (ref 7–25)
CHLORIDE: 104 mmol/L (ref 98–110)
CO2: 27 mmol/L (ref 20–31)
CREATININE: 0.85 mg/dL (ref 0.70–1.11)
Calcium: 9.3 mg/dL (ref 8.6–10.3)
Glucose, Bld: 125 mg/dL — ABNORMAL HIGH (ref 65–99)
POTASSIUM: 4.3 mmol/L (ref 3.5–5.3)
Sodium: 140 mmol/L (ref 135–146)

## 2015-07-04 ENCOUNTER — Telehealth: Payer: Self-pay | Admitting: Cardiology

## 2015-07-04 ENCOUNTER — Telehealth: Payer: Self-pay | Admitting: General Practice

## 2015-07-04 ENCOUNTER — Telehealth: Payer: Self-pay | Admitting: Internal Medicine

## 2015-07-04 ENCOUNTER — Ambulatory Visit: Payer: Medicare Other

## 2015-07-04 ENCOUNTER — Telehealth: Payer: Self-pay | Admitting: *Deleted

## 2015-07-04 ENCOUNTER — Encounter: Payer: Self-pay | Admitting: Physician Assistant

## 2015-07-04 NOTE — Telephone Encounter (Signed)
Will forward this message to Dr Meda Coffee and Richardson Dopp PA-C for further review.

## 2015-07-04 NOTE — Telephone Encounter (Signed)
Pt called and ask if you Jenny Reichmann would call him at [phone]

## 2015-07-04 NOTE — Telephone Encounter (Signed)
Mr. Kulzer is calling to let Dr. Meda Coffee and Nicki Reaper weaver know that he has resume taking his coumadin . Just an FYI.. Thanks

## 2015-07-04 NOTE — Telephone Encounter (Signed)
Ok  Make sure he follows up with PCP to have it checked within the first week he starts back on it. Richardson Dopp, PA-C   07/04/2015 3:33 PM

## 2015-07-04 NOTE — Telephone Encounter (Signed)
Pt has been notified of echo results and findings as well as lab results. Pt verbalized understanding to results given today. Pt asked for me to please send echo results to Dr. Gilford Raid.

## 2015-07-04 NOTE — Telephone Encounter (Signed)
Spoke with patient.

## 2015-07-04 NOTE — Telephone Encounter (Addendum)
Notified the pt that per Richardson Dopp PA-C, he should follow-up with his PCP to have his PT/INR checked within the first week he resumed it back.  Pt has an appt with Scott on 5/8, and then has an appt with his PCP on 07/11/15, to have his levels checked.  Pt verbalized understanding and agrees with this plan.  Pt gracious for all the assistance provided and prompt follow-up.

## 2015-07-04 NOTE — Telephone Encounter (Signed)
Instructed patient to take coumadin, 8 mg today and tomorrow and then resume 4 mg daily and 8 mg on thursdays.  Check INR next Wednesday 5/10.  Patient verbalized understanding.

## 2015-07-08 NOTE — Progress Notes (Signed)
Cardiology Office Note:    Date:  07/09/2015   ID:  Eric Rivers, DOB 1930-07-19, MRN [id number]  PCP:  Cathlean Cower, MD  Cardiologist: Dr. Ena Dawley  Electrophysiologist: N/a Urologist: Dr. Tresa Moore  Referring MD: Biagio Borg, MD   Chief Complaint  Patient presents with  . Congestive Heart Failure    Follow up    History of Present Illness:     Eric Rivers is a 82 y.o. male with a hx of chronic AFib, aortic dissection s/p emergent repair in 2014 with reimplantation of coronary arteries, DM2, HTN, HL, diastolic HF, mild AS, mild MR, pulmonary HTN, chronic anticoagulation with Warfarin.   I saw him 2 weeks ago for volume overload.  He had been admitted in 3/17 with gross hematuria and required transfusion with PRBCs.  He was readmitted in mid April and underwent cystoscopy with clot evacuation and removal of a small bladder stone as well as TURP. He had issues with urinary retention and required Foley catheter placement.  I increase his Lasix to 40 mg QD x 1 week, then resume PRN.  FU echo demonstrated normal EF and now mod AS.  He returns for FU.  He is feeling somewhat better.  Breathing is easier. He is not quite back to baseline.  He is NYHA 2b.  He denies chest pain.  Denies orthopnea, PND.  Feels like his edema is stable.  Denies syncope.     Past Medical History  Diagnosis Date  . Whooping cough   . Atrial fibrillation (HCC)      Chronic,   24 hour holter, September, 2011.... atrial fib rate is controlled.... there is some bradycardia but  no marked pauses  . Diabetes mellitus     type 2  . Hypertension   . DDD (degenerative disc disease)     lumbar spine  . GERD (gastroesophageal reflux disease)   . Gout   . Fluid overload   . ACE-inhibitor cough   . Shortness of breath     on exertion  . Aortic stenosis     mild....echo... september... 2010/ mild... echo.[name] KitchenMarland KitchenDec, 2011  . Pulmonary hypertension ([place])     74mmHg, echo, 01/2010  . Hyperlipidemia   .  Hx of colonoscopy     approx. 10 years  . Warfarin anticoagulation     Atrial fib  . Ejection fraction     EF 60%, echo, 01/2010  . Normal nuclear stress test     06/2005 , also ABI normal 2007  . Hypercholesterolemia   . Fracture of metacarpal bone 11/30/11  . Aortic dissection [facility])     Surgical repair February, 2014  . Mitral regurgitation   . Diabetes mellitus with neuropathy ([place]) 12/04/2006    Qualifier: Diagnosis of  By: Linda Hedges MD, Heinz Knuckles  medications - DPP4   . Bladder cancer [facility])     recurrence with TUR-B March '09  . Prostate cancer ([place])     radiation tx.  . Diverticulosis of colon with hemorrhage 01/01/2014  . History of echocardiogram     Echo 5/17 - mild LVH, EF 55-60%, mod AS (mean 16 mmHg, peak 29 mmHg), MAC, midl MR, massive BAE, mod dilated RV, low normal RVSF, mod TR, PASP 64 mmHg    Past Surgical History  Procedure Laterality Date  . Lumbar laminectomy      '02  . Tur-bt '06, '09    . Bladder cancer biopsy  10/16/2010    negative for malignancy  .  Bladder microscopic  04/13/2007    high grade papillary urothelial lesions  . 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 Prostate biopsy  09/25/2011    GLEASON 3+3=6 AND 4+3=7  . Ascending aortic root replacement N/A 04/15/2012    Procedure: ASCENDING AORTIC ROOT REPLACEMENT;  Surgeon: Gaye Pollack, MD;  Location: [facility];  Service: Open Heart Surgery;  Laterality: N/A;  . Tee without cardioversion N/A 04/15/2012    Procedure: TRANSESOPHAGEAL ECHOCARDIOGRAM (TEE);  Surgeon: Gaye Pollack, MD;  Location: [facility];  Service: Open Heart Surgery;  Laterality: N/A;  . Exploration post operative open heart  04/2012  . Cystoscopy/retrograde/ureteroscopy Bilateral 04/04/2013    Procedure: CYSTOSCOPY WITH RETROGRADE PYELOGRAM AND BLADDER BIOPSY;  Surgeon: Molli Hazard, MD;  Location: [facility];  Service: Urology;  Laterality: Bilateral;  . Herniatic disc surgery    . Colonoscopy N/A  01/01/2014    Procedure: COLONOSCOPY;  Surgeon: Gatha Mayer, MD;  Location: [facility];  Service: Endoscopy;  Laterality: N/A;  . Transurethral resection of bladder tumor N/A 06/13/2015    Procedure: TRANSURETHRAL RESECTION OF PROSTATE (TURP) CLOT EVACUATION AND FULGERATION, BLADDER STONE REMOVAL ;  Surgeon: Alexis Frock, MD;  Location: [facility];  Service: Urology;  Laterality: N/A;    Current Medications: Outpatient Prescriptions Prior to Visit  Medication Sig Dispense Refill  . acetaminophen (TYLENOL) 500 MG tablet Take 1,000 mg by mouth 3 (three) times daily as needed for mild pain, moderate pain, fever or headache.     [name] Kitchen amLODipine (NORVASC) 2.5 MG tablet Take 1 tablet (2.5 mg total) by mouth daily. 90 tablet 3  . aspirin EC 325 MG tablet Take 325 mg by mouth daily.    . Calcium Carb-Cholecalciferol (CALCIUM 600 + D PO) Take 1 tablet by mouth at bedtime.     [name] Kitchen desonide (DESOWEN) 0.05 % lotion Apply 1 application topically daily as needed (rosacea).     . finasteride (PROSCAR) 5 MG tablet Take 1 tablet (5 mg total) by mouth every morning. 90 tablet 3  . ketoconazole (NIZORAL) 2 % shampoo Apply 1 application topically 4 (four) times a week.    . Multiple Vitamins-Minerals (ONCOVITE PO) Take 1 tablet by mouth daily.     . potassium chloride (K-DUR,KLOR-CON) 10 MEQ tablet Take 1 tablet (10 mEq total) by mouth daily. 90 tablet 1  . senna (SENOKOT) 8.6 MG tablet Take 1 tablet by mouth at bedtime.     . Tamsulosin HCl (FLOMAX) 0.4 MG CAPS Take 0.4 mg by mouth at bedtime.     . furosemide (LASIX) 40 MG tablet Take 1 tablet (40 mg total) by mouth daily as needed for fluid or edema. 30 tablet 2  . allopurinol (ZYLOPRIM) 300 MG tablet take 1 tablet by mouth once daily (Patient taking differently: Take 300 mg by mouth once daily) 90 tablet 1  . atorvastatin (LIPITOR) 20 MG tablet take 1 tablet by mouth every morning (Patient taking differently: Take 20 mg by mouth every morning) 90 tablet 3  .  JANUVIA 100 MG tablet take 1 tablet by mouth once daily (Patient taking differently: Take 100 mg by mouth once daily) 90 tablet 2  . pantoprazole (PROTONIX) 40 MG tablet take 1 tablet by mouth once daily (Patient taking differently: Take 40 mg by mouth once daily) 90 tablet 3  . traZODone (DESYREL) 50 MG tablet take 1/2  TO ONE tablet by mouth at bedtime if needed for sleep (Patient  taking differently: TAKE 25 TO 50 MG BY MOUTH AT BEDTIME AS NEEDED FOR SLEEP) 90 tablet 1   No facility-administered medications prior to visit.      Allergies:   Metoprolol   Social History   Social History  . Marital Status: Single    Spouse Name: N/A  . Number of Children: 0  . Years of Education: 59   Occupational History  . history and Advice worker     retired   Social History Main Topics  . Smoking status: Former Smoker -- 1.00 packs/day    Types: Cigarettes    Quit date: 03/04/1975  . Smokeless tobacco: Never Used  . Alcohol Use: 7.0 oz/week    14 drink(s) per week     Comment: liquor daily  . Drug Use: No  . Sexual Activity: Not Currently   Other Topics Concern  . None   Social History Narrative   Confirmed batchelor. Retired Education officer, museum. World traveler- Ambulance person. I- ADLS. End of life Care   No CPR, no prolonged intubation, no prolonged artificial hydration or feeding, no heroic or futile measures.       Next trip Sept [place]13 [place]      Last cruise - two cruises back to back to the [place] in January '15. May'15: [place] with stops in [place], [place], [place], [place], [place]. 15 days      Fall '15: 50 Days from [place]. [place], [place], [place], [place], [place], [place], [place], [place], [place].       May '15 - 15 days up the [employer] as far as [place]              Family History:  The patient's family history includes Cancer in his father; Heart attack in his mother; Heart  disease in his mother; Hypertension in his mother; Prostate cancer (age of onset: 57) in his father; Stroke in his father.   ROS:   Please see the history of present illness.    Review of Systems  Cardiovascular: Positive for dyspnea on exertion.  Hematologic/Lymphatic: Bruises/bleeds easily.  Genitourinary: Positive for incomplete emptying.  Neurological: Positive for loss of balance.   All other systems reviewed and are negative.   Physical Exam:    VS:  BP 140/56 mmHg  Pulse 72  Ht 5\' 8"  (1.727 m)  Wt 225 lb (102.059 kg)  BMI 34.22 kg/m2  SpO2 94%   GEN: Well nourished, well developed, in no acute distress HEENT: normal Neck: JVP 5-6 cm, no masses Cardiac: Normal S1/S2, irreg irreg rhythm; no murmurs, rubs, or gallops, tr-1+ bilat LE edema   Respiratory:  clear to auscultation bilaterally; no wheezing, rhonchi or rales GI: soft, nontender, nondistended MS: no deformity or atrophy Skin: warm and dry Neuro: No focal deficits  Psych: Alert and oriented x 3, normal affect  Wt Readings from Last 3 Encounters:  07/09/15 225 lb (102.059 kg)  06/26/15 224 lb 6.4 oz (101.787 kg)  06/13/15 220 lb 7.4 oz (100 kg)      Studies/Labs Reviewed:     EKG:  EKG is  ordered today.  The ekg ordered today demonstrates AFib, HR 72, no changes  Recent Labs: 04/10/2015: TSH 1.48 06/01/2015: ALT 15* 06/02/2015: B Natriuretic Peptide 431.3* 06/26/2015: Hemoglobin 9.0*; Platelets 336 07/03/2015: BUN 15; Creat 0.85; Potassium 4.3; Sodium 140   Recent Lipid Panel    Component Value Date/Time  CHOL 102 04/10/2015 1230   TRIG 57.0 04/10/2015 1230   HDL 45.60 04/10/2015 1230   CHOLHDL 2 04/10/2015 1230   VLDL 11.4 04/10/2015 1230   LDLCALC 45 04/10/2015 1230    Additional studies/ records that were reviewed today include:   Echo 5/17 mild LVH, EF 55-60%, mod AS (mean 16 mmHg, peak 29 mmHg), MAC, mild MR, massive BAE, mod dilated RV, low normal RVSF, mod TR, PASP 64 mmHg  Chest CTA  9/16 IMPRESSION: 1. Stable appearance of ascending aortic repair, and persistent descending and abdominal aortic dissection flap as detailed above. 2. 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 3. Marked biatrial enlargement. Thrombus in the left atrial appendage is more conspicuous than on prior exam. 4. Extension of dissection flap through the main right renal artery and into the proximal left common iliac artery, stable compared to prior exam. 5. Cholelithiasis 6. Bladder calculus.  Echo 07/20/13 EF 55-60%, normal wall motion, Doppler parameters consistent with elevated mean LA filling pressure, mild aortic stenosis (mean 17 mmHg, peak 30 mmHg), mild LAE, moderately reduced RVSF, severe RAE, moderate TR, PASP 62 mmHg    ASSESSMENT:     1. Chronic diastolic heart failure ([place])   2. Chronic atrial fibrillation (HCC)   3. Essential hypertension   4. Aortic stenosis   5. Ascending aortic dissection (HCC)     PLAN:     In order of problems listed above:  1.Chronic diastolic CHF - Volume is improved since last seen.  Weights are down 6 lbs at home.  I suspect his dyspnea is multifactorial and related to deconditioning, age, CHF, anemia.  I have recommended that he remain on Lasix 40 mg 3 days a week.  FU BMET 2 weeks.  FU with Dr. Meda Coffee in 6-8 weeks.   2. Chronic AFib - Rate controlled. CHADS2-VASc=4 (age, HTN, vascular dz). He has been cleared by Urology and is now back on Coumadin.  His PCP manages his INR.     3. HTN - Controlled.   4. Aortic stenosis - Now moderate.  But, gradients largely unchanged since 2015.    5. Aortic dissection - s/p repair in 2014. Followed by [facility] (Dr. Cyndia Bent).    Medication Adjustments/Labs and Tests Ordered: Current medicines are reviewed at length with the patient today.  Concerns regarding medicines are outlined above.  Medication changes, Labs and Tests ordered today are outlined in the Patient Instructions noted below. Patient  Instructions  Medication Instructions:  1. START TAKING LASIX 3 TIMES A WEEK Labwork: 2 WEEKS BMET Testing/Procedures: NONE Follow-Up: DR. Meda Coffee 09/19/15 @ 11:45  Any Other Special Instructions Will Be Listed Below (If Applicable). If you need a refill on your cardiac medications before your next appointment, please call your pharmacy.    Signed, Richardson Dopp, PA-C  07/09/2015 1:34 PM    [facility] [address], [place], [place]  [address] Phone: [phone]; Fax: [phone]

## 2015-07-09 ENCOUNTER — Encounter: Payer: Self-pay | Admitting: Physician Assistant

## 2015-07-09 ENCOUNTER — Ambulatory Visit (INDEPENDENT_AMBULATORY_CARE_PROVIDER_SITE_OTHER): Payer: Medicare Other | Admitting: Physician Assistant

## 2015-07-09 VITALS — BP 140/56 | HR 72 | Ht 68.0 in | Wt 225.0 lb

## 2015-07-09 DIAGNOSIS — I10 Essential (primary) hypertension: Secondary | ICD-10-CM

## 2015-07-09 DIAGNOSIS — I482 Chronic atrial fibrillation, unspecified: Secondary | ICD-10-CM

## 2015-07-09 DIAGNOSIS — I350 Nonrheumatic aortic (valve) stenosis: Secondary | ICD-10-CM

## 2015-07-09 DIAGNOSIS — I7101 Dissection of ascending aorta: Secondary | ICD-10-CM

## 2015-07-09 DIAGNOSIS — I5032 Chronic diastolic (congestive) heart failure: Secondary | ICD-10-CM

## 2015-07-09 MED ORDER — FUROSEMIDE 40 MG PO TABS: 40.0000 mg | ORAL_TABLET | ORAL | Status: DC

## 2015-07-09 NOTE — Patient Instructions (Addendum)
Medication Instructions:  1. START TAKING LASIX 3 TIMES A WEEK Labwork: 2 WEEKS BMET Testing/Procedures: NONE Follow-Up: DR. Meda Coffee 09/19/15 @ 11:45  Any Other Special Instructions Will Be Listed Below (If Applicable). If you need a refill on your cardiac medications before your next appointment, please call your pharmacy.

## 2015-07-10 ENCOUNTER — Other Ambulatory Visit: Payer: Self-pay

## 2015-07-10 MED ORDER — ALLOPURINOL 300 MG PO TABS: 300.0000 mg | ORAL_TABLET | Freq: Every day | ORAL | Status: DC

## 2015-07-10 MED ORDER — ATORVASTATIN CALCIUM 20 MG PO TABS: 20.0000 mg | ORAL_TABLET | Freq: Every day | ORAL | Status: DC

## 2015-07-10 MED ORDER — PANTOPRAZOLE SODIUM 40 MG PO TBEC: 40.0000 mg | DELAYED_RELEASE_TABLET | Freq: Every day | ORAL | Status: DC

## 2015-07-11 ENCOUNTER — Other Ambulatory Visit: Payer: Self-pay | Admitting: *Deleted

## 2015-07-11 ENCOUNTER — Ambulatory Visit (INDEPENDENT_AMBULATORY_CARE_PROVIDER_SITE_OTHER): Payer: Medicare Other | Admitting: General Practice

## 2015-07-11 DIAGNOSIS — I482 Chronic atrial fibrillation, unspecified: Secondary | ICD-10-CM

## 2015-07-11 DIAGNOSIS — I272 Other secondary pulmonary hypertension: Secondary | ICD-10-CM | POA: Diagnosis not present

## 2015-07-11 DIAGNOSIS — Z7901 Long term (current) use of anticoagulants: Secondary | ICD-10-CM | POA: Diagnosis not present

## 2015-07-11 DIAGNOSIS — Z5181 Encounter for therapeutic drug level monitoring: Secondary | ICD-10-CM

## 2015-07-11 LAB — POCT INR: INR: 1.6

## 2015-07-11 MED ORDER — POTASSIUM CHLORIDE CRYS ER 10 MEQ PO TBCR: 10.0000 meq | EXTENDED_RELEASE_TABLET | Freq: Every day | ORAL | Status: DC

## 2015-07-11 MED ORDER — AMLODIPINE BESYLATE 2.5 MG PO TABS: 2.5000 mg | ORAL_TABLET | Freq: Every day | ORAL | Status: DC

## 2015-07-11 NOTE — Progress Notes (Signed)
Pre visit review using our clinic review tool, if applicable. No additional management support is needed unless otherwise documented below in the visit note. 

## 2015-07-11 NOTE — Progress Notes (Signed)
I agree with this plan.

## 2015-07-12 ENCOUNTER — Telehealth: Payer: Self-pay | Admitting: *Deleted

## 2015-07-12 MED ORDER — PANTOPRAZOLE SODIUM 40 MG PO TBEC: 40.0000 mg | DELAYED_RELEASE_TABLET | Freq: Every day | ORAL | Status: DC

## 2015-07-12 MED ORDER — ALLOPURINOL 300 MG PO TABS: 300.0000 mg | ORAL_TABLET | Freq: Every day | ORAL | Status: DC

## 2015-07-12 NOTE — Telephone Encounter (Signed)
Left msg on triage stating refills that was sent 5/9, he is wanting change to 90 day. Sent electronically for 90 day...Johny Chess

## 2015-07-16 NOTE — Telephone Encounter (Signed)
Pt called about the below. Gave pt rf date for 90 and 1. Pt is contacting the pharmacy.

## 2015-07-23 ENCOUNTER — Other Ambulatory Visit (INDEPENDENT_AMBULATORY_CARE_PROVIDER_SITE_OTHER): Payer: Medicare Other

## 2015-07-23 ENCOUNTER — Telehealth: Payer: Self-pay | Admitting: *Deleted

## 2015-07-23 DIAGNOSIS — I5032 Chronic diastolic (congestive) heart failure: Secondary | ICD-10-CM | POA: Diagnosis not present

## 2015-07-23 LAB — BASIC METABOLIC PANEL
BUN: 14 mg/dL (ref 7–25)
CALCIUM: 8.9 mg/dL (ref 8.6–10.3)
CO2: 27 mmol/L (ref 20–31)
CREATININE: 0.9 mg/dL (ref 0.70–1.11)
Chloride: 104 mmol/L (ref 98–110)
GLUCOSE: 151 mg/dL — AB (ref 65–99)
Potassium: 4.2 mmol/L (ref 3.5–5.3)
SODIUM: 139 mmol/L (ref 135–146)

## 2015-07-23 NOTE — Telephone Encounter (Signed)
Pt notified of lab results by phone with verbal understanding. Pt stated he would like to let Bassett know that he has been taking the lasix 40 mg QOD instead of as outlined in ov 5/8 of 3 x weekly, pt said QOD is easier for him. He stated he still is having DOE, (ie walking). Pt states weight was about 220-222, he could not remember exactly, he did not weight today though. Does c/o some edema in feet, but not bad. Denies any chest pain. I asked pt to please weigh on a daily basis and if weight is up 2-3 lb' x 1 day to call the office however, I will d/w Nicki Reaper about our conversation tonight for further recommendations and cb tomorrow. Pt said ok and thank you.

## 2015-07-27 ENCOUNTER — Telehealth: Payer: Self-pay | Admitting: *Deleted

## 2015-07-27 DIAGNOSIS — I5032 Chronic diastolic (congestive) heart failure: Secondary | ICD-10-CM

## 2015-07-27 NOTE — Telephone Encounter (Signed)
Pt agreeable to bmet in 2 weeks, bmet scheduled for 6/13.

## 2015-08-01 ENCOUNTER — Ambulatory Visit (INDEPENDENT_AMBULATORY_CARE_PROVIDER_SITE_OTHER): Payer: Medicare Other | Admitting: General Practice

## 2015-08-01 DIAGNOSIS — I482 Chronic atrial fibrillation, unspecified: Secondary | ICD-10-CM

## 2015-08-01 DIAGNOSIS — Z5181 Encounter for therapeutic drug level monitoring: Secondary | ICD-10-CM

## 2015-08-01 DIAGNOSIS — I272 Other secondary pulmonary hypertension: Secondary | ICD-10-CM | POA: Diagnosis not present

## 2015-08-01 DIAGNOSIS — Z7901 Long term (current) use of anticoagulants: Secondary | ICD-10-CM

## 2015-08-01 LAB — POCT INR: INR: 2.7

## 2015-08-01 NOTE — Progress Notes (Signed)
I agree with this plan.

## 2015-08-01 NOTE — Progress Notes (Signed)
Pre visit review using our clinic review tool, if applicable. No additional management support is needed unless otherwise documented below in the visit note. 

## 2015-08-02 DIAGNOSIS — E1151 Type 2 diabetes mellitus with diabetic peripheral angiopathy without gangrene: Secondary | ICD-10-CM | POA: Diagnosis not present

## 2015-08-02 DIAGNOSIS — I739 Peripheral vascular disease, unspecified: Secondary | ICD-10-CM | POA: Diagnosis not present

## 2015-08-02 DIAGNOSIS — L603 Nail dystrophy: Secondary | ICD-10-CM | POA: Diagnosis not present

## 2015-08-14 ENCOUNTER — Other Ambulatory Visit (INDEPENDENT_AMBULATORY_CARE_PROVIDER_SITE_OTHER): Payer: Medicare Other | Admitting: *Deleted

## 2015-08-14 DIAGNOSIS — I5032 Chronic diastolic (congestive) heart failure: Secondary | ICD-10-CM

## 2015-08-14 DIAGNOSIS — I509 Heart failure, unspecified: Secondary | ICD-10-CM | POA: Diagnosis not present

## 2015-08-14 LAB — BASIC METABOLIC PANEL
BUN: 15 mg/dL (ref 7–25)
CALCIUM: 8.9 mg/dL (ref 8.6–10.3)
CHLORIDE: 104 mmol/L (ref 98–110)
CO2: 27 mmol/L (ref 20–31)
CREATININE: 0.95 mg/dL (ref 0.70–1.11)
Glucose, Bld: 153 mg/dL — ABNORMAL HIGH (ref 65–99)
Potassium: 4 mmol/L (ref 3.5–5.3)
Sodium: 139 mmol/L (ref 135–146)

## 2015-08-14 NOTE — Addendum Note (Signed)
Addended by: Eulis Foster on: 08/14/2015 01:12 PM   Modules accepted: Orders

## 2015-08-21 DIAGNOSIS — N3041 Irradiation cystitis with hematuria: Secondary | ICD-10-CM | POA: Diagnosis not present

## 2015-08-21 DIAGNOSIS — C61 Malignant neoplasm of prostate: Secondary | ICD-10-CM | POA: Diagnosis not present

## 2015-08-23 DIAGNOSIS — L570 Actinic keratosis: Secondary | ICD-10-CM | POA: Diagnosis not present

## 2015-08-23 DIAGNOSIS — L814 Other melanin hyperpigmentation: Secondary | ICD-10-CM | POA: Diagnosis not present

## 2015-08-23 DIAGNOSIS — I872 Venous insufficiency (chronic) (peripheral): Secondary | ICD-10-CM | POA: Diagnosis not present

## 2015-08-23 DIAGNOSIS — D2272 Melanocytic nevi of left lower limb, including hip: Secondary | ICD-10-CM | POA: Diagnosis not present

## 2015-08-23 DIAGNOSIS — Z85820 Personal history of malignant melanoma of skin: Secondary | ICD-10-CM | POA: Diagnosis not present

## 2015-08-23 DIAGNOSIS — L219 Seborrheic dermatitis, unspecified: Secondary | ICD-10-CM | POA: Diagnosis not present

## 2015-08-23 DIAGNOSIS — D1800 Hemangioma unspecified site: Secondary | ICD-10-CM | POA: Diagnosis not present

## 2015-08-23 DIAGNOSIS — L821 Other seborrheic keratosis: Secondary | ICD-10-CM | POA: Diagnosis not present

## 2015-08-23 DIAGNOSIS — Z86018 Personal history of other benign neoplasm: Secondary | ICD-10-CM | POA: Diagnosis not present

## 2015-08-23 DIAGNOSIS — Z85828 Personal history of other malignant neoplasm of skin: Secondary | ICD-10-CM | POA: Diagnosis not present

## 2015-08-23 DIAGNOSIS — D225 Melanocytic nevi of trunk: Secondary | ICD-10-CM | POA: Diagnosis not present

## 2015-08-29 ENCOUNTER — Ambulatory Visit (INDEPENDENT_AMBULATORY_CARE_PROVIDER_SITE_OTHER): Payer: Medicare Other | Admitting: General Practice

## 2015-08-29 ENCOUNTER — Other Ambulatory Visit: Payer: Self-pay

## 2015-08-29 DIAGNOSIS — Z5181 Encounter for therapeutic drug level monitoring: Secondary | ICD-10-CM | POA: Diagnosis not present

## 2015-08-29 DIAGNOSIS — Z7901 Long term (current) use of anticoagulants: Secondary | ICD-10-CM

## 2015-08-29 DIAGNOSIS — I272 Other secondary pulmonary hypertension: Secondary | ICD-10-CM

## 2015-08-29 DIAGNOSIS — I482 Chronic atrial fibrillation, unspecified: Secondary | ICD-10-CM

## 2015-08-29 LAB — POCT INR: INR: 2.7

## 2015-08-29 MED ORDER — TRAZODONE HCL 50 MG PO TABS: 50.0000 mg | ORAL_TABLET | ORAL | Status: DC

## 2015-08-29 NOTE — Progress Notes (Signed)
Pre visit review using our clinic review tool, if applicable. No additional management support is needed unless otherwise documented below in the visit note. 

## 2015-08-29 NOTE — Progress Notes (Signed)
I have reviewed and agree with the plan. 

## 2015-09-01 ENCOUNTER — Emergency Department (HOSPITAL_COMMUNITY)
Admission: EM | Admit: 2015-09-01 | Discharge: 2015-09-01 | Disposition: A | Discharge status: Home / Self Care | Payer: Medicare Other | Attending: Emergency Medicine | Admitting: Emergency Medicine

## 2015-09-01 ENCOUNTER — Emergency Department (EMERGENCY_DEPARTMENT_HOSPITAL)
Admit: 2015-09-01 | Discharge: 2015-09-01 | Disposition: A | Discharge status: Home / Self Care | Payer: Medicare Other | Attending: Emergency Medicine | Admitting: Emergency Medicine

## 2015-09-01 ENCOUNTER — Encounter (HOSPITAL_COMMUNITY): Payer: Self-pay

## 2015-09-01 ENCOUNTER — Emergency Department (HOSPITAL_COMMUNITY): Payer: Medicare Other

## 2015-09-01 DIAGNOSIS — I10 Essential (primary) hypertension: Secondary | ICD-10-CM | POA: Diagnosis not present

## 2015-09-01 DIAGNOSIS — M79604 Pain in right leg: Secondary | ICD-10-CM | POA: Insufficient documentation

## 2015-09-01 DIAGNOSIS — E119 Type 2 diabetes mellitus without complications: Secondary | ICD-10-CM | POA: Insufficient documentation

## 2015-09-01 DIAGNOSIS — Z7901 Long term (current) use of anticoagulants: Secondary | ICD-10-CM | POA: Insufficient documentation

## 2015-09-01 DIAGNOSIS — M79661 Pain in right lower leg: Secondary | ICD-10-CM | POA: Diagnosis not present

## 2015-09-01 DIAGNOSIS — Z8546 Personal history of malignant neoplasm of prostate: Secondary | ICD-10-CM | POA: Insufficient documentation

## 2015-09-01 DIAGNOSIS — R030 Elevated blood-pressure reading, without diagnosis of hypertension: Secondary | ICD-10-CM | POA: Diagnosis not present

## 2015-09-01 DIAGNOSIS — Z7982 Long term (current) use of aspirin: Secondary | ICD-10-CM | POA: Insufficient documentation

## 2015-09-01 DIAGNOSIS — Z79899 Other long term (current) drug therapy: Secondary | ICD-10-CM | POA: Insufficient documentation

## 2015-09-01 DIAGNOSIS — Z87891 Personal history of nicotine dependence: Secondary | ICD-10-CM | POA: Insufficient documentation

## 2015-09-01 DIAGNOSIS — Z8551 Personal history of malignant neoplasm of bladder: Secondary | ICD-10-CM | POA: Insufficient documentation

## 2015-09-01 DIAGNOSIS — E785 Hyperlipidemia, unspecified: Secondary | ICD-10-CM | POA: Diagnosis not present

## 2015-09-01 DIAGNOSIS — I4891 Unspecified atrial fibrillation: Secondary | ICD-10-CM | POA: Diagnosis not present

## 2015-09-01 DIAGNOSIS — M25561 Pain in right knee: Secondary | ICD-10-CM | POA: Diagnosis not present

## 2015-09-01 LAB — PROTIME-INR
INR: 3.02 — ABNORMAL HIGH (ref 0.00–1.49)
PROTHROMBIN TIME: 29.8 s — AB (ref 11.6–15.2)

## 2015-09-01 NOTE — ED Provider Notes (Signed)
CSN: [id number]     Arrival date & time 09/01/15  [id number] History   First MD Initiated Contact with Patient 09/01/15 [phone]     Chief Complaint  Patient presents with  . Knee Pain     (Consider location/radiation/quality/duration/timing/severity/associated sxs/prior Treatment) Patient is a 82 y.o. male presenting with knee pain. The history is provided by the patient.  Knee Pain Location:  Knee Time since incident:  2 weeks Injury: no   Knee location:  R knee Pain details:    Quality:  Aching   Radiates to:  Does not radiate   Severity:  Moderate   Onset quality:  Gradual   Duration:  2 weeks   Timing:  Constant   Progression:  Worsening Chronicity:  New Dislocation: no   Relieved by:  Nothing Worsened by:  Bearing weight, exercise, flexion and extension Ineffective treatments:  None tried Associated symptoms: no fever    82 yo M With a chief complaint right knee pain. This been bothering him for a week or so. Patient states the swelling is usually improved with Lasix. He takes this every other day. Has noticed though that the right side is much more swollen than the left. Has no history of blood clots. Is on Coumadin for A. fib and was therapeutic just this past week. Denies any injury denies recent surgery.  Past Medical History  Diagnosis Date  . Whooping cough   . Atrial fibrillation (HCC)      Chronic,   24 hour holter, September, 2011.... atrial fib rate is controlled.... there is some bradycardia but  no marked pauses  . Diabetes mellitus     type 2  . Hypertension   . DDD (degenerative disc disease)     lumbar spine  . GERD (gastroesophageal reflux disease)   . Gout   . Fluid overload   . ACE-inhibitor cough   . Shortness of breath     on exertion  . Aortic stenosis     mild....echo... september... 2010/ mild... echo.[name] KitchenMarland KitchenDec, 2011  . Pulmonary hypertension ([place])     53mmHg, echo, 01/2010  . Hyperlipidemia   . Hx of colonoscopy     approx. 10 years  . Warfarin  anticoagulation     Atrial fib  . Ejection fraction     EF 60%, echo, 01/2010  . Normal nuclear stress test     06/2005 , also ABI normal 2007  . Hypercholesterolemia   . Fracture of metacarpal bone 11/30/11  . Aortic dissection [facility])     Surgical repair February, 2014  . Mitral regurgitation   . Diabetes mellitus with neuropathy ([place]) 12/04/2006    Qualifier: Diagnosis of  By: Linda Hedges MD, Heinz Knuckles  medications - DPP4   . Bladder cancer [facility])     recurrence with TUR-B March '09  . Prostate cancer ([place])     radiation tx.  . Diverticulosis of colon with hemorrhage 01/01/2014  . History of echocardiogram     Echo 5/17 - mild LVH, EF 55-60%, mod AS (mean 16 mmHg, peak 29 mmHg), MAC, midl MR, massive BAE, mod dilated RV, low normal RVSF, mod TR, PASP 64 mmHg   Past Surgical History  Procedure Laterality Date  . Lumbar laminectomy      '02  . Tur-bt '06, '09    . Bladder cancer biopsy  10/16/2010    negative for malignancy  . Bladder microscopic  04/13/2007    high grade papillary urothelial lesions  . 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 Prostate biopsy  09/25/2011    GLEASON 3+3=6 AND 4+3=7  . Ascending aortic root replacement N/A 04/15/2012    Procedure: ASCENDING AORTIC ROOT REPLACEMENT;  Surgeon: Gaye Pollack, MD;  Location: [place] OR;  Service: Open Heart Surgery;  Laterality: N/A;  . Tee without cardioversion N/A 04/15/2012    Procedure: TRANSESOPHAGEAL ECHOCARDIOGRAM (TEE);  Surgeon: Gaye Pollack, MD;  Location: [place];  Service: Open Heart Surgery;  Laterality: N/A;  . Exploration post operative open heart  04/2012  . Cystoscopy/retrograde/ureteroscopy Bilateral 04/04/2013    Procedure: CYSTOSCOPY WITH RETROGRADE PYELOGRAM AND BLADDER BIOPSY;  Surgeon: Molli Hazard, MD;  Location: [facility];  Service: Urology;  Laterality: Bilateral;  . Herniatic disc surgery    . Colonoscopy N/A 01/01/2014    Procedure: COLONOSCOPY;  Surgeon: Gatha Mayer, MD;  Location: [facility];  Service: Endoscopy;  Laterality: N/A;  . Transurethral resection of bladder tumor N/A 06/13/2015    Procedure: TRANSURETHRAL RESECTION OF PROSTATE (TURP) CLOT EVACUATION AND FULGERATION, BLADDER STONE REMOVAL ;  Surgeon: Alexis Frock, MD;  Location: [facility];  Service: Urology;  Laterality: N/A;   Family History  Problem Relation Age of Onset  . Prostate cancer Father 58    passed with prostate ca  . Cancer Father   . Stroke Father   . Heart disease Mother   . Hypertension Mother   . Heart attack Mother    Social History  Substance Use Topics  . Smoking status: Former Smoker -- 1.00 packs/day    Types: Cigarettes    Quit date: 03/04/1975  . Smokeless tobacco: Never Used  . Alcohol Use: 7.0 oz/week    14 drink(s) per week     Comment: liquor daily    Review of Systems  Constitutional: Negative for fever and chills.  HENT: Negative for congestion and facial swelling.   Eyes: Negative for discharge and visual disturbance.  Respiratory: Negative for shortness of breath.   Cardiovascular: Negative for chest pain and palpitations.  Gastrointestinal: Negative for vomiting, abdominal pain and diarrhea.  Musculoskeletal: Positive for myalgias and arthralgias.  Skin: Negative for color change and rash.  Neurological: Negative for tremors, syncope and headaches.  Psychiatric/Behavioral: Negative for confusion and dysphoric mood.      Allergies  Metoprolol  Home Medications   Prior to Admission medications   Medication Sig Start Date End Date Taking? Authorizing Provider  acetaminophen (TYLENOL) 500 MG tablet Take 1,000 mg by mouth 3 (three) times daily as needed for mild pain, moderate pain, fever or headache.    Yes Historical Provider, MD  allopurinol (ZYLOPRIM) 300 MG tablet Take 1 tablet (300 mg total) by mouth daily. 07/12/15  Yes Biagio Borg, MD  amLODipine (NORVASC) 2.5 MG tablet Take 1 tablet (2.5 mg total) by mouth daily. 07/11/15   Yes Dorothy Spark, MD  aspirin EC 325 MG tablet Take 325 mg by mouth daily.   Yes Historical Provider, MD  atorvastatin (LIPITOR) 20 MG tablet Take 1 tablet (20 mg total) by mouth daily. 07/10/15  Yes Biagio Borg, MD  Calcium Carb-Cholecalciferol (CALCIUM 600 + D PO) Take 1 tablet by mouth at bedtime.    Yes Historical Provider, MD  desonide (DESOWEN) 0.05 % lotion Apply 1 application topically daily as needed (rosacea).    Yes Historical Provider, MD  finasteride (PROSCAR) 5 MG tablet Take 1 tablet (5 mg total) by mouth every morning. 12/23/13  Yes Biagio Borg, MD  furosemide (LASIX) 40 MG tablet Take 1 tablet (40 mg total) by mouth 3 (three) times a week. Patient taking differently: Take 40 mg by mouth every other day.  07/09/15  Yes Scott Joylene Draft, PA-C  ketoconazole (NIZORAL) 2 % shampoo Apply 1 application topically 4 (four) times a week.   Yes Historical Provider, MD  mirabegron ER (MYRBETRIQ) 50 MG TB24 tablet Take 50 mg by mouth daily.   Yes Historical Provider, MD  Multiple Vitamins-Minerals (ONCOVITE PO) Take 1 tablet by mouth daily.    Yes Historical Provider, MD  pantoprazole (PROTONIX) 40 MG tablet Take 1 tablet (40 mg total) by mouth daily. 07/12/15  Yes Biagio Borg, MD  potassium chloride (K-DUR,KLOR-CON) 10 MEQ tablet Take 1 tablet (10 mEq total) by mouth daily. 07/11/15  Yes Dorothy Spark, MD  senna (SENOKOT) 8.6 MG tablet Take 1 tablet by mouth at bedtime.    Yes Historical Provider, MD  sitaGLIPtin (JANUVIA) 100 MG tablet Take 100 mg by mouth daily.   Yes Historical Provider, MD  Tamsulosin HCl (FLOMAX) 0.4 MG CAPS Take 0.4 mg by mouth at bedtime.    Yes Historical Provider, MD  traZODone (DESYREL) 50 MG tablet Take 1 tablet (50 mg total) by mouth as directed. 08/29/15  Yes Biagio Borg, MD   BP 165/88 mmHg  Pulse 76  Temp(Src) 97.5 F (36.4 C) (Oral)  Resp 20  SpO2 94% Physical Exam  Constitutional: He is oriented to person, place, and time. He appears well-developed  and well-nourished.  HENT:  Head: Normocephalic and atraumatic.  Eyes: EOM are normal. Pupils are equal, round, and reactive to light.  Neck: Normal range of motion. Neck supple. No JVD present.  Cardiovascular: Normal rate and regular rhythm.  Exam reveals no gallop and no friction rub.   No murmur heard. Pulmonary/Chest: No respiratory distress. He has no wheezes.  Abdominal: He exhibits no distension. There is no rebound and no guarding.  Musculoskeletal: Normal range of motion. He exhibits edema and tenderness.  Tenderness and swelling to the right knee in particular. No noted erythema or warmth. Pulse motor and sensation is intact distally.  Neurological: He is alert and oriented to person, place, and time.  Skin: No rash noted. No pallor.  Psychiatric: He has a normal mood and affect. His behavior is normal.  Nursing note and vitals reviewed.   ED Course  Procedures (including critical care time) Labs Review Labs Reviewed  PROTIME-INR - Abnormal; Notable for the following:    Prothrombin Time 29.8 (*)    INR 3.02 (*)    All other components within normal limits    Imaging Review Dg Knee Complete 4 Views Right  09/01/2015  CLINICAL DATA:  Right knee pain and swelling, no known injury, initial encounter EXAM: RIGHT KNEE - COMPLETE 4+ VIEW COMPARISON:  None. FINDINGS: Joint space narrowing is noted medially consistent with mild osteoarthritic change. No joint effusion is noted. Generalized soft tissue swelling is seen consistent with the patient's given clinical history. Diffuse vascular calcifications are noted. IMPRESSION: Degenerative change without acute bony abnormality. Mild soft tissue edema is noted. Electronically Signed   By: Inez Catalina M.D.   On: 09/01/2015 07:48   I have personally reviewed and evaluated these images and lab results as part of my medical decision-making.   EKG Interpretation None      MDM   Final diagnoses:  Right leg pain    82 yo M With  right lower cavity swelling. Concern for possible DVT that feel this is unlikely with a history of him being on Coumadin. Will obtain a plain film and a vascular lab ultrasound.  Imaging negative, d/c home.   9:55 AM:  I have discussed the diagnosis/risks/treatment options with the patient and believe the pt to be eligible for discharge home to follow-up with PCP. We also discussed returning to the ED immediately if new or worsening sx occur. We discussed the sx which are most concerning (e.g., sudden worsening pain, fever, inability to tolerate by mouth) that necessitate immediate return. Medications administered to the patient during their visit and any new prescriptions provided to the patient are listed below.  Medications given during this visit Medications - No data to display  New Prescriptions   No medications on file    The patient appears reasonably screen and/or stabilized for discharge and I doubt any other medical condition or other [facility] requiring further screening, evaluation, or treatment in the ED at this time prior to discharge.    Deno Etienne, DO 09/01/15 [phone]

## 2015-09-01 NOTE — ED Notes (Addendum)
Per EMS- from home c/o chronic right knee pain. No hx traumatic injury or surgery. Recent exacerbation of pain symptoms with swelling and reduced mobility .

## 2015-09-01 NOTE — Progress Notes (Signed)
VASCULAR LAB PRELIMINARY  PRELIMINARY  PRELIMINARY  PRELIMINARY  Right lower extremity venous duplex completed.    Preliminary report:  Right:  No evidence of DVT, superficial thrombosis, or Baker's cyst.  Ashlee Player, RVT 09/01/2015, 9:12 AM

## 2015-09-01 NOTE — ED Notes (Signed)
Patient transported to X-ray 

## 2015-09-01 NOTE — ED Notes (Signed)
Bed: WA04 Expected date:  Expected time:  Means of arrival:  Comments: EMS 

## 2015-09-01 NOTE — Discharge Instructions (Signed)

## 2015-09-03 ENCOUNTER — Telehealth: Payer: Self-pay | Admitting: Cardiology

## 2015-09-03 NOTE — Telephone Encounter (Signed)
Patient called to report he went to the ED over the weekend for R knee swelling and pain. He was found negative for DVT and sent home. He took Lasix 40 mg two days in a row (instead of qod as instructed) and the swelling and pain has mostly resolved. He has an appointment with Dr. Jenny Reichmann this Friday. Since his symptoms are much improved, instructed him to keep OV with Dr. Meda Coffee 7/19. Encouraged him to keep OV with PCP and if PCP believes he needs to be seen sooner by Cardiology that can be arranged. Patient was grateful for call.

## 2015-09-03 NOTE — Telephone Encounter (Signed)
New message   Pt is calling for RN to tell him if he needs a sooner appt than Dr.Nelsons 09/19/15 appt  Pt was seen in [facility] ER and he wants to know should he keep this same appt or be seen sooner by Dr.Nelson or PA?

## 2015-09-05 IMAGING — CT CT ANGIO ABDOMEN
1 of 7 series · 10 of 46 positions shown, 16 images · IV contrast (80CC OMNI 350)
Comparison: 04/15/2012 and earlier studies

CLINICAL DATA: Aortic dissection.

EXAM:
CT ANGIOGRAPHY CHEST, ABDOMEN AND PELVIS
TECHNIQUE: Multidetector CT imaging through the chest, abdomen and pelvis was
performed using the standard protocol during bolus administration of
intravenous contrast. Multiplanar reconstructed images including
MIPs were obtained and reviewed to evaluate the vascular anatomy.
CONTRAST:  80mL OMNIPAQUE IOHEXOL 350 MG/ML SOLN

[Series 4: angio · axial · 0.92mm/px · z∈[-595,-55]mm · 10 of 260 slices shown, 16 images]
[im 22/260  soft-tissue]
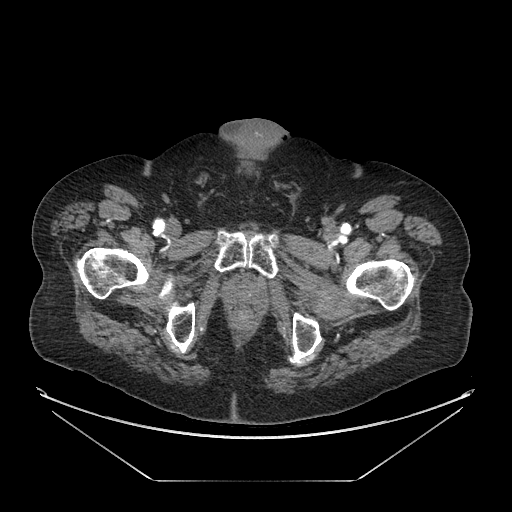
[im 22/260  bone]
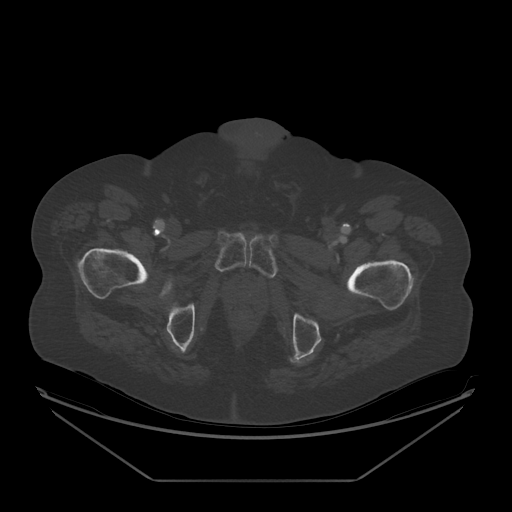
[im 44/260  soft-tissue]
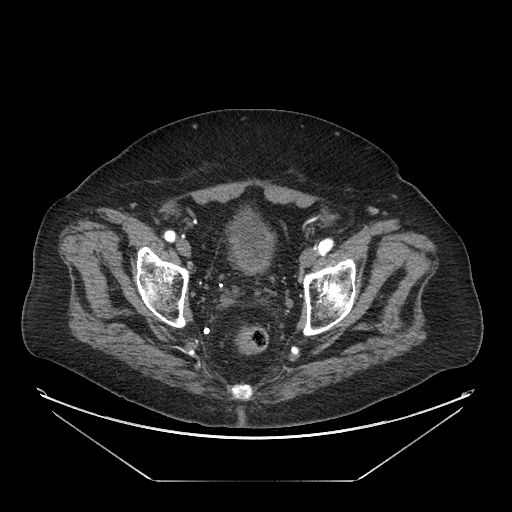
[im 65/260  soft-tissue]
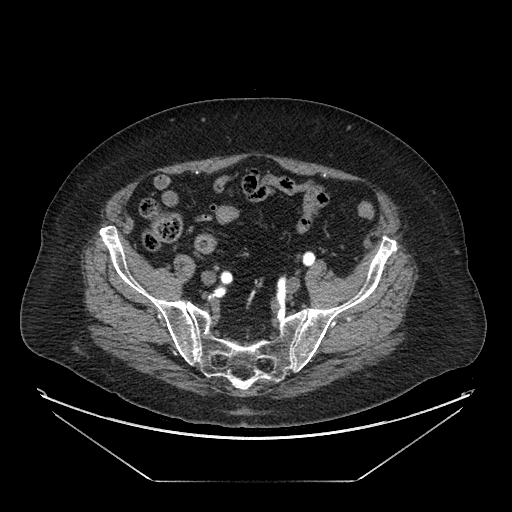
[im 87/260  soft-tissue]
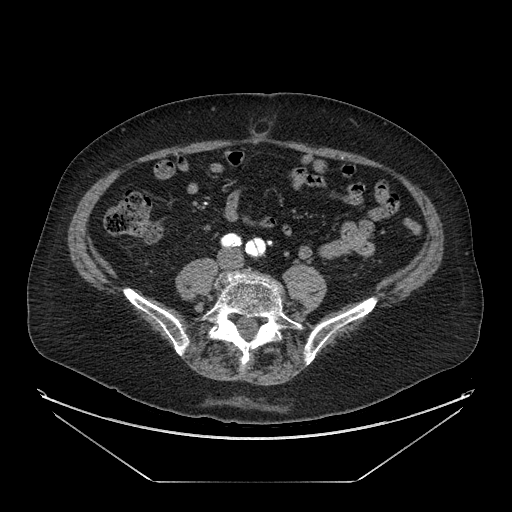
[im 108/260  soft-tissue]
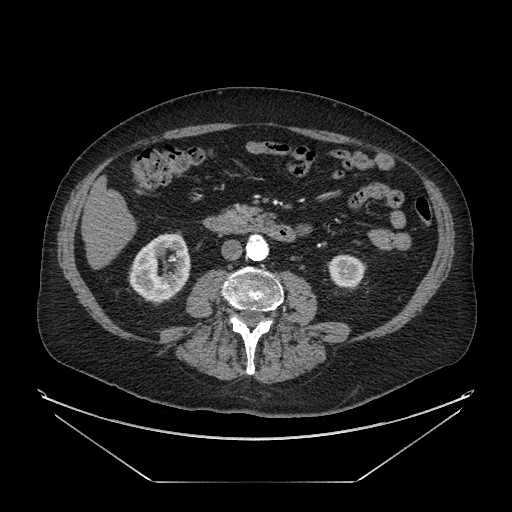
[im 152/260  soft-tissue]
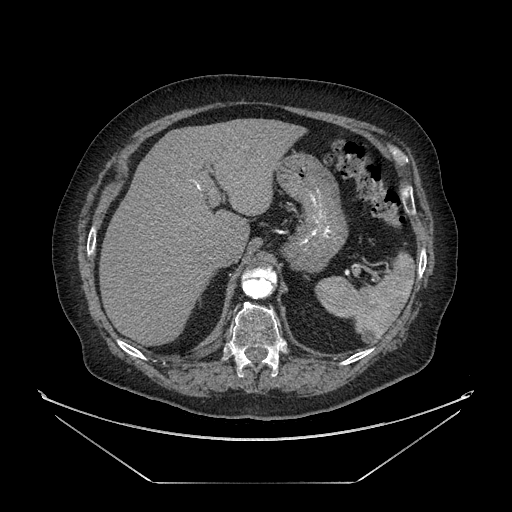
[im 173/260  soft-tissue]
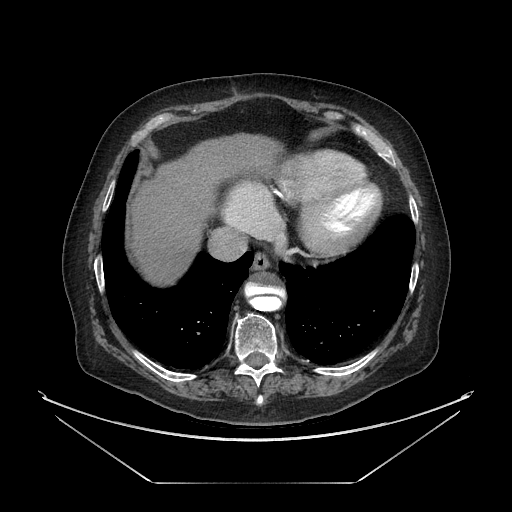
[im 173/260  lung]
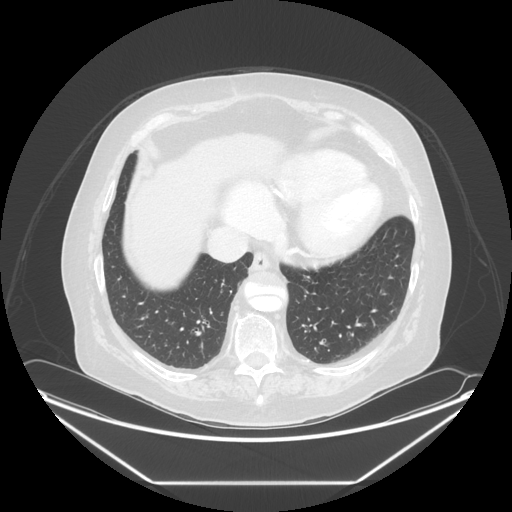
[im 195/260  soft-tissue]
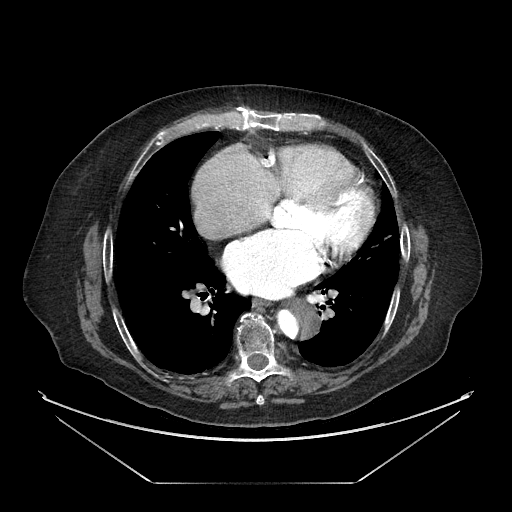
[im 195/260  lung]
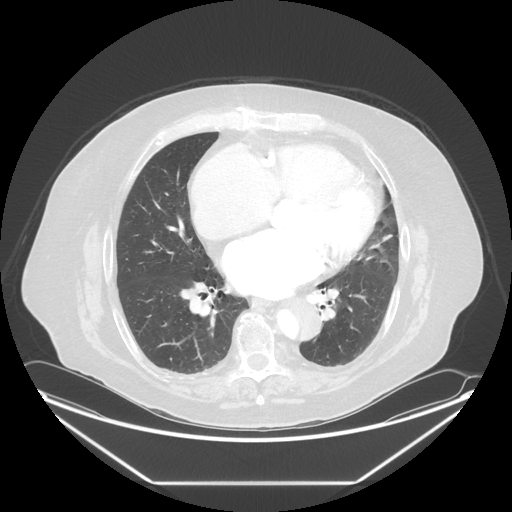
[im 216/260  soft-tissue]
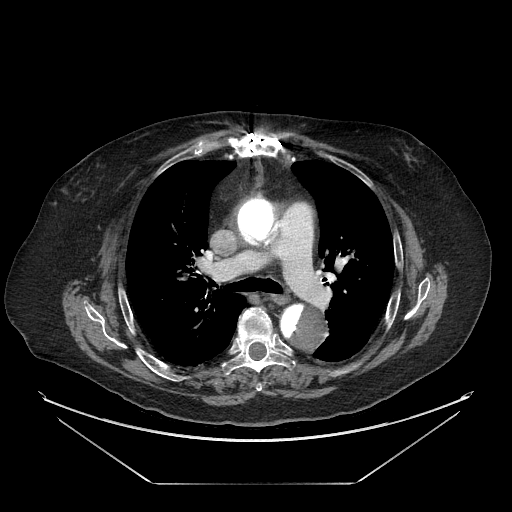
[im 216/260  lung]
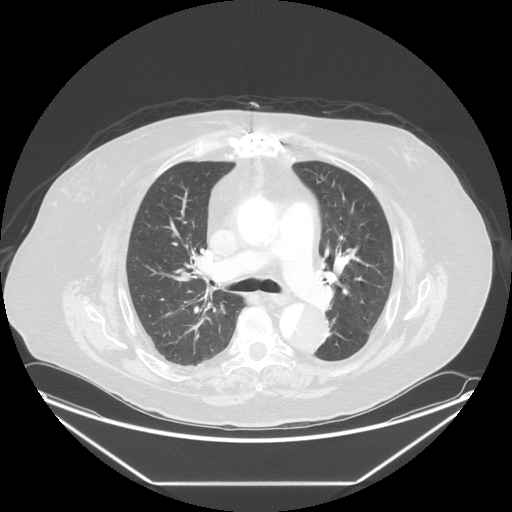
[im 216/260  bone]
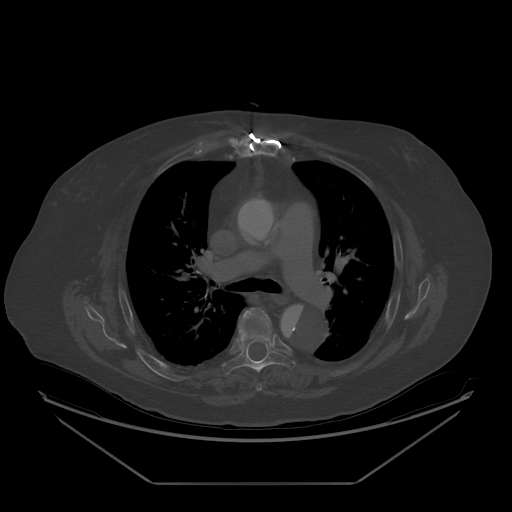
[im 238/260  soft-tissue]
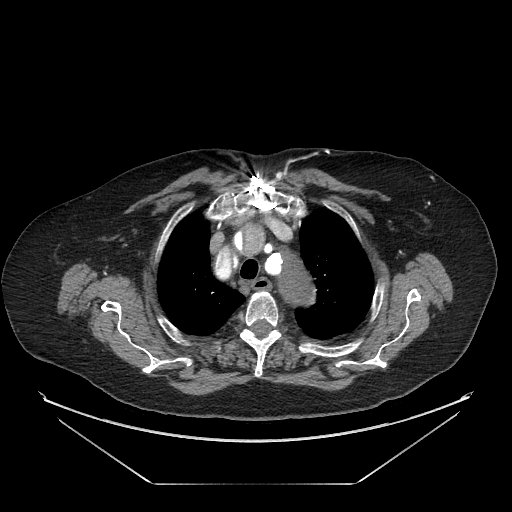
[im 238/260  lung]
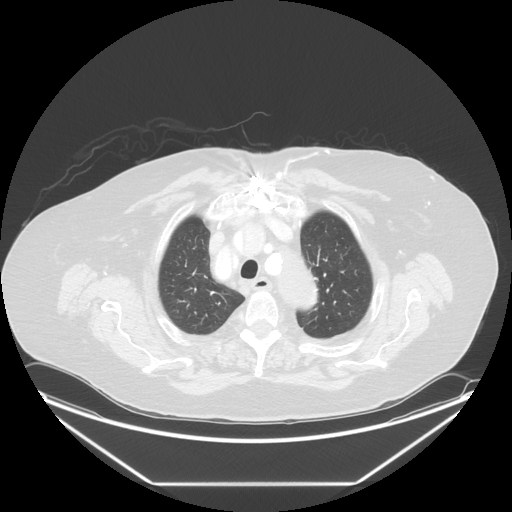

[10 of 46 positions shown; findings below may reference images not displayed]

FINDINGS: CTA CHEST FINDINGS

Changes of interval median sternotomy and ascending aortic arch
repair. Non contrast scan shows extensive coronary calcifications.
Displaced intimal calcifications in the descending thoracic aorta
are again evident. No hyperdense crescent or mediastinal hematoma.

CTA demonstrates patent proximal and distal anastomoses of the
ascending aorta repair. The dissection flap is seen extending from
the proximal arch through the length of the descending thoracic
aorta. Dissection extends into the right brachiocephalic,
subclavian, and axillary arteries with patency of the false lumen.
No definite extension into the common carotid or left subclavian
arteries, all supplied by the true lumen. There is slow flow evident
in the false lumen distally. There is aneurysmal dilatation of the
aortic arch, measuring 5.3 cm proximally, 4.4 cm distal
arch/proximal descending, tapering to 3.5 cm in the distal
descending segment just above the diaphragm.

No pleural or pericardial effusion. No hilar or mediastinal
adenopathy. There is biatrial enlargement. Incomplete opacification
of the pulmonary arterial tree ; the exam was not optimized for
detection of pulmonary emboli. Mild spondylitic changes noted in the
thoracic spine. Linear scarring or subsegmental atelectasis in the
lower lingula. Lungs otherwise clear.

Review of the MIP images confirms the above findings.

CTA ABDOMEN AND PELVIS FINDINGS

Arterial findings:

Aorta: The dissection flap extends through the length of the
abdominal aorta, which is ectatic, 2.8 cm maximum diameter in the
infrarenal segment. True and false lumen remain patent.

Celiac axis: Patent, with extension that dissection flap through the
distal celiac axis without evidence of occlusive lesion, classic
distal trifurcation anatomy.

Superior mesenteric: Patent, supplied by the false lumen. This
dissection flap extends into the 1st 2 cm of the vessel. There is
replaced right hepatic arterial supply, an anatomic variant.

Left renal: Single, with dissection flap extending approximately to
1.5 cm into the vessel , patent distally.

Right renal: Single, with dissection flap extending to the
bifurcation of the main right renal artery, both true and false
lumens patent, no high-grade stenosis.

Inferior mesenteric: Patent. The dissection flap extends just beyond
the ostium. No high-grade stenosis.

Left iliac: Dissection flap terminates in the mid common iliac.
There is scattered eccentric nonocclusive plaque in the external and
internal iliac arteries without aneurysm or stenosis.

Right iliac: The dissection flap does not extend into the right
iliac arterial system. There is some scattered eccentric
nonocclusive calcified plaque. No aneurysm or stenosis.

Venous findings: Dedicated venous phase imaging was not obtained.

Review of the MIP images confirms the above findings.

Nonvascular findings: Multiple subcentimeter partial calcified
stones layering in the dependent aspect of the nondilated
gallbladder. Unremarkable arterial phase evaluation of liver,
adrenal glands, pancreas, left kidney. Stable exophytic 14 mm
probable cyst from the upper pole right kidney. No hydronephrosis.
Stable low-attenuation 14 mm lesion in the posterior aspect of the
spleen, otherwise unremarkable. Stomach, small bowel, and colon are
nondilated. Normal appendix. Urinary bladder incompletely distended.
Moderate prostatic enlargement with placement of metallic fiducial
type markers since previous pelvic scan of 03/15/2004. Bilateral
pelvic phleboliths. No ascites. No free air. No adenopathy.
Degenerative disc disease L4-5 with bilateral facet disease at this
level allowing grade 1 anterolisthesis.
IMPRESSION: CTA CHEST IMPRESSION

1. Interval surgical replacement of the ascending aorta without
apparent complication.
2. Persistent dissection flap through the aortic arch and descending
segment, involving right innominate, subclavian, and axillary
arteries.
3. Stable fusiform 5.3 cm aneurysm of the aortic arch.

CTA ABDOMEN AND PELVIS IMPRESSION

1. Extension of dissection flap through the length of the abdominal
aorta, involving left common iliac artery, celiac axis, SMA,
bilateral renal arteries, and it for mesenteric artery without
definite hemodynamically significant lesion.
2. Ectatic abdominal aorta at risk for aneurysm development.
Recommend follow up by US in 5 years. This recommendation follows
ACR consensus guidelines: White Paper of the ACR Incidental Findings
Committee II on Vascular Findings. [HOSPITAL] 2027;
[DATE].
3. Cholelithiasis

:
Review of the MIP images confirms the above findings.

## 2015-09-07 ENCOUNTER — Ambulatory Visit (INDEPENDENT_AMBULATORY_CARE_PROVIDER_SITE_OTHER): Payer: Medicare Other | Admitting: Internal Medicine

## 2015-09-07 ENCOUNTER — Encounter: Payer: Self-pay | Admitting: Internal Medicine

## 2015-09-07 ENCOUNTER — Ambulatory Visit (INDEPENDENT_AMBULATORY_CARE_PROVIDER_SITE_OTHER)
Admission: RE | Admit: 2015-09-07 | Discharge: 2015-09-07 | Disposition: A | Discharge status: Home / Self Care | Payer: Medicare Other | Source: Ambulatory Visit | Attending: Internal Medicine | Admitting: Internal Medicine

## 2015-09-07 ENCOUNTER — Other Ambulatory Visit (INDEPENDENT_AMBULATORY_CARE_PROVIDER_SITE_OTHER): Payer: Medicare Other

## 2015-09-07 VITALS — BP 140/82 | HR 75 | Temp 98.6°F | Resp 20 | Wt 225.0 lb

## 2015-09-07 DIAGNOSIS — R0600 Dyspnea, unspecified: Secondary | ICD-10-CM | POA: Insufficient documentation

## 2015-09-07 DIAGNOSIS — M25561 Pain in right knee: Secondary | ICD-10-CM

## 2015-09-07 DIAGNOSIS — J90 Pleural effusion, not elsewhere classified: Secondary | ICD-10-CM | POA: Diagnosis not present

## 2015-09-07 DIAGNOSIS — I5032 Chronic diastolic (congestive) heart failure: Secondary | ICD-10-CM | POA: Diagnosis not present

## 2015-09-07 DIAGNOSIS — J449 Chronic obstructive pulmonary disease, unspecified: Secondary | ICD-10-CM | POA: Diagnosis not present

## 2015-09-07 DIAGNOSIS — R609 Edema, unspecified: Secondary | ICD-10-CM

## 2015-09-07 LAB — CBC WITH DIFFERENTIAL/PLATELET
BASOS ABS: 0 10*3/uL (ref 0.0–0.1)
Basophils Relative: 0.4 % (ref 0.0–3.0)
EOS ABS: 0.2 10*3/uL (ref 0.0–0.7)
Eosinophils Relative: 1.7 % (ref 0.0–5.0)
HCT: 27.5 % — ABNORMAL LOW (ref 39.0–52.0)
Lymphocytes Relative: 8.6 % — ABNORMAL LOW (ref 12.0–46.0)
Lymphs Abs: 0.8 10*3/uL (ref 0.7–4.0)
MCHC: 30.4 g/dL (ref 30.0–36.0)
MCV: 70.8 fl — ABNORMAL LOW (ref 78.0–100.0)
MONO ABS: 0.8 10*3/uL (ref 0.1–1.0)
Monocytes Relative: 8.1 % (ref 3.0–12.0)
Neutro Abs: 7.7 10*3/uL (ref 1.4–7.7)
Neutrophils Relative %: 81.2 % — ABNORMAL HIGH (ref 43.0–77.0)
Platelets: 264 10*3/uL (ref 150.0–400.0)
RBC: 3.88 Mil/uL — AB (ref 4.22–5.81)
RDW: 21.8 % — AB (ref 11.5–15.5)
WBC: 9.5 10*3/uL (ref 4.0–10.5)

## 2015-09-07 LAB — BASIC METABOLIC PANEL
BUN: 20 mg/dL (ref 6–23)
CALCIUM: 9.9 mg/dL (ref 8.4–10.5)
CO2: 29 mEq/L (ref 19–32)
Chloride: 101 mEq/L (ref 96–112)
Creatinine, Ser: 0.95 mg/dL (ref 0.40–1.50)
GFR: 80.44 mL/min (ref >60.00)
GLUCOSE: 141 mg/dL — AB (ref 70–99)
POTASSIUM: 4.4 meq/L (ref 3.5–5.1)
SODIUM: 138 meq/L (ref 135–145)

## 2015-09-07 LAB — HEPATIC FUNCTION PANEL
ALBUMIN: 4.1 g/dL (ref 3.5–5.2)
ALK PHOS: 79 U/L (ref 39–117)
ALT: 12 U/L (ref 0–53)
AST: 20 U/L (ref 0–37)
BILIRUBIN DIRECT: 0.3 mg/dL (ref 0.0–0.3)
BILIRUBIN TOTAL: 0.9 mg/dL (ref 0.2–1.2)
Total Protein: 7.4 g/dL (ref 6.0–8.3)

## 2015-09-07 LAB — BRAIN NATRIURETIC PEPTIDE: PRO B NATRI PEPTIDE: 566 pg/mL — AB (ref 0.0–100.0)

## 2015-09-07 MED ORDER — FUROSEMIDE 40 MG PO TABS: 40.0000 mg | ORAL_TABLET | Freq: Every day | ORAL | Status: DC

## 2015-09-07 NOTE — Assessment & Plan Note (Signed)
No giveaways or falls, but with effusion and pain recently, for right knee film, also refer Dr Smith/sport medicine, tylenol prn,  to f/u any worsening symptoms or concerns

## 2015-09-07 NOTE — Progress Notes (Signed)
Pre visit review using our clinic review tool, if applicable. No additional management support is needed unless otherwise documented below in the visit note. 

## 2015-09-07 NOTE — Assessment & Plan Note (Addendum)
Likely multifactorial including diast CHF, afib, mod AS, obesity, anemia vs other; for increased lasix to 40 qd, cxr, and labs as ordered, recect ECG reviewed, pt without CP, has card f/u July 19,  to f/u any worsening symptoms or concerns  Note:  Total time for pt hx, exam, review of record with pt in the room, determination of diagnoses and plan for further eval and tx is > 40 min, with over 50% spent in coordination and counseling of patient

## 2015-09-07 NOTE — Patient Instructions (Addendum)
OK to increase the lasix to 40 mg every day until you are seen per cardiology  Please continue all other medications as before, and refills have been done if requested.  Please have the pharmacy call with any other refills you may need.  Please continue your efforts at being more active, low cholesterol diet, and weight control.  Please keep your appointments with your specialists as you may have planned  You will be contacted regarding the referral for: Dr Tamala Julian for the right knee  Please go to the XRAY Department in the Basement (go straight as you get off the elevator) for the x-ray testing  - the cxr, and the right knee xrays  Please go to the LAB in the Basement (turn left off the elevator) for the tests to be done today  You will be contacted by phone if any changes need to be made immediately.  Otherwise, you will receive a letter about your results with an explanation, but please check with MyChart first.  Please remember to sign up for MyChart if you have not done so, as this will be important to you in the future with finding out test results, communicating by private email, and scheduling acute appointments online when needed.  OK to cancel your aug appt with me, Please return in 4 months, or sooner if needed

## 2015-09-07 NOTE — Progress Notes (Signed)
Subjective:    Patient ID: Eric Rivers, male    DOB: 08-19-1930, 82 y.o.   MRN: [id number]  HPI  Here with fluid retention to the legs it seems; symptoms started with an episode of bleeding on a cruise, stopped , but after back on land noticed increased urinary freq, saw urology with "ok" exam, but later symptoms persisted, returned to urology with further bleeding, had what sounds like cystoscopy with cauterization of bladder lesion (no bladder cancer).  Since then - Denies urinary symptoms such as dysuria, frequency, urgency, flank pain, hematuria or n/v, fever, chills.    Since then has had unusual worseining sob/doe gradaually worsening in x 2 mo, has known hx of chf, and usually has some minor leg swelling, but now with marked increased swelling, wt and leg discomfort.  Has seen ED recently, now here today, and has plans for card appt sched approx x 2 wks, Dr Meda Coffee.  Usual wts per pt has been about 215 -220. Wt Readings from Last 3 Encounters:  09/07/15 225 lb (102.059 kg)  07/09/15 225 lb (102.059 kg)  06/26/15 224 lb 6.4 oz (101.787 kg)  Good compliance with lasix 3 times per wk, actually takes sometimes 4 times when just takes it qod. No forced po fluids  Pt denies chest pain, wheezing, orthopnea, palpitations, dizziness or syncope.  Pt denies fever, wt loss, night sweats, loss of appetite, or other constitutional symptoms  Past Medical History  Diagnosis Date  . Whooping cough   . Atrial fibrillation (HCC)      Chronic,   24 hour holter, September, 2011.... atrial fib rate is controlled.... there is some bradycardia but  no marked pauses  . Diabetes mellitus     type 2  . Hypertension   . DDD (degenerative disc disease)     lumbar spine  . GERD (gastroesophageal reflux disease)   . Gout   . Fluid overload   . ACE-inhibitor cough   . Shortness of breath     on exertion  . Aortic stenosis     mild....echo... september... 2010/ mild... echo.[name] KitchenMarland KitchenDec, 2011  . Pulmonary  hypertension ([place])     64mmHg, echo, 01/2010  . Hyperlipidemia   . Hx of colonoscopy     approx. 10 years  . Warfarin anticoagulation     Atrial fib  . Ejection fraction     EF 60%, echo, 01/2010  . Normal nuclear stress test     06/2005 , also ABI normal 2007  . Hypercholesterolemia   . Fracture of metacarpal bone 11/30/11  . Aortic dissection [facility])     Surgical repair February, 2014  . Mitral regurgitation   . Diabetes mellitus with neuropathy ([place]) 12/04/2006    Qualifier: Diagnosis of  By: Linda Hedges MD, Heinz Knuckles  medications - DPP4   . Bladder cancer [facility])     recurrence with TUR-B March '09  . Prostate cancer ([place])     radiation tx.  . Diverticulosis of colon with hemorrhage 01/01/2014  . History of echocardiogram     Echo 5/17 - mild LVH, EF 55-60%, mod AS (mean 16 mmHg, peak 29 mmHg), MAC, midl MR, massive BAE, mod dilated RV, low normal RVSF, mod TR, PASP 64 mmHg   Past Surgical History  Procedure Laterality Date  . Lumbar laminectomy      '02  . Tur-bt '06, '09    . Bladder cancer biopsy  10/16/2010    negative for malignancy  . Bladder microscopic  04/13/2007    high grade papillary urothelial lesions  . 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 Prostate biopsy  09/25/2011    GLEASON 3+3=6 AND 4+3=7  . Ascending aortic root replacement N/A 04/15/2012    Procedure: ASCENDING AORTIC ROOT REPLACEMENT;  Surgeon: Gaye Pollack, MD;  Location: [facility];  Service: Open Heart Surgery;  Laterality: N/A;  . Tee without cardioversion N/A 04/15/2012    Procedure: TRANSESOPHAGEAL ECHOCARDIOGRAM (TEE);  Surgeon: Gaye Pollack, MD;  Location: [place] [facility];  Service: Open Heart Surgery;  Laterality: N/A;  . Exploration post operative open heart  04/2012  . Cystoscopy/retrograde/ureteroscopy Bilateral 04/04/2013    Procedure: CYSTOSCOPY WITH RETROGRADE PYELOGRAM AND BLADDER BIOPSY;  Surgeon: Molli Hazard, MD;  [facility];  Service: Urology;   Laterality: Bilateral;  . Herniatic disc surgery    . Colonoscopy N/A 01/01/2014    Procedure: COLONOSCOPY;  Surgeon: Gatha Mayer, MD;  [facility];  Service: Endoscopy;  Laterality: N/A;  . Transurethral resection of bladder tumor N/A 06/13/2015    Procedure: TRANSURETHRAL RESECTION OF PROSTATE (TURP) CLOT EVACUATION AND FULGERATION, BLADDER STONE REMOVAL ;  Surgeon: Alexis Frock, MD;  [facility];  Service: Urology;  Laterality: N/A;    reports that he quit smoking about 40 years ago. His smoking use included Cigarettes. He smoked 1.00 pack per day. He has never used smokeless tobacco. He reports that he drinks about 7.0 oz of alcohol per week. He reports that he does not use illicit drugs. family history includes Cancer in his father; Heart attack in his mother; Heart disease in his mother; Hypertension in his mother; Prostate cancer (age of onset: 53) in his father; Stroke in his father. Allergies  Allergen Reactions  . Metoprolol Other (See Comments)    Marked bradycardia at 25 mg bid   / Current Outpatient Prescriptions on File Prior to Visit  Medication Sig Dispense Refill  . acetaminophen (TYLENOL) 500 MG tablet Take 1,000 mg by mouth 3 (three) times daily as needed for mild pain, moderate pain, fever or headache.     . allopurinol (ZYLOPRIM) 300 MG tablet Take 1 tablet (300 mg total) by mouth daily. 90 tablet 1  . amLODipine (NORVASC) 2.5 MG tablet Take 1 tablet (2.5 mg total) by mouth daily. 90 tablet 3  . aspirin EC 325 MG tablet Take 325 mg by mouth daily.    [name] Kitchen atorvastatin (LIPITOR) 20 MG tablet Take 1 tablet (20 mg total) by mouth daily. 30 tablet 3  . Calcium Carb-Cholecalciferol (CALCIUM 600 + D PO) Take 1 tablet by mouth at bedtime.     [name] Kitchen desonide (DESOWEN) 0.05 % lotion Apply 1 application topically daily as needed (rosacea).     . finasteride (PROSCAR) 5 MG tablet Take 1 tablet (5 mg total) by mouth every morning. 90 tablet 3  . furosemide (LASIX) 40 MG  tablet Take 1 tablet (40 mg total) by mouth 3 (three) times a week. (Patient taking differently: Take 40 mg by mouth every other day. )    . ketoconazole (NIZORAL) 2 % shampoo Apply 1 application topically 4 (four) times a week.    . mirabegron ER (MYRBETRIQ) 50 MG TB24 tablet Take 50 mg by mouth daily.    . Multiple Vitamins-Minerals (ONCOVITE PO) Take 1 tablet by mouth daily.     . pantoprazole (PROTONIX) 40 MG tablet Take 1 tablet (40 mg total) by mouth daily. 90 tablet 1  .  potassium chloride (K-DUR,KLOR-CON) 10 MEQ tablet Take 1 tablet (10 mEq total) by mouth daily. 90 tablet 3  . senna (SENOKOT) 8.6 MG tablet Take 1 tablet by mouth at bedtime.     . sitaGLIPtin (JANUVIA) 100 MG tablet Take 100 mg by mouth daily.    . Tamsulosin HCl (FLOMAX) 0.4 MG CAPS Take 0.4 mg by mouth at bedtime.     . traZODone (DESYREL) 50 MG tablet Take 1 tablet (50 mg total) by mouth as directed. 30 tablet 3   No current facility-administered medications on file prior to visit.   Review of Systems  Constitutional: Negative for unusual diaphoresis or night sweats HENT: Negative for ear swelling or discharge Eyes: Negative for worsening visual haziness  Respiratory: Negative for choking and stridor.   Gastrointestinal: Negative for distension or worsening eructation Genitourinary: Negative for retention or change in urine volume.  Musculoskeletal: Negative for other MSK pain or swelling Skin: Negative for color change and worsening wound Neurological: Negative for tremors and numbness other than noted  Psychiatric/Behavioral: Negative for decreased concentration or agitation other than above       Objective:   Physical Exam BP 140/82 mmHg  Pulse 75  Temp(Src) 98.6 F (37 C) (Oral)  Resp 20  Wt 225 lb (102.059 kg)  SpO2 97%  Ambulatory O2 sat: 94% at 100 ft VS noted,  Constitutional: Pt appears in no apparent distress HENT: Head: NCAT.  Right Ear: External ear normal.  Left Ear: External ear  normal.  Eyes: . Pupils are equal, round, and reactive to light. Conjunctivae and EOM are normal Neck: Normal range of motion. Neck supple.  Cardiovascular: Normal rate and irregular rhythm.  , gr 2?6 systolic murmur Pulmonary/Chest: Effort normal and breath sounds decreased with few bibas rales, no wheezing. or rhonci Abd:  Soft, NT, ND, + BS Neurological: Pt is alert. Not confused , motor grossly intact Skin: Skin is warm. No rash, 1-2+ edema to knees, right > left, also right knee small effusion Psychiatric: Pt behavior is normal. No agitation.   Lab Results  Component Value Date   WBC 9.4 06/26/2015   HGB 9.0* 06/26/2015   HCT 30.5* 06/26/2015   PLT 336 06/26/2015   GLUCOSE 153* 08/14/2015   CHOL 102 04/10/2015   TRIG 57.0 04/10/2015   HDL 45.60 04/10/2015   LDLCALC 45 04/10/2015   ALT 15* 06/01/2015   AST 35 06/01/2015   NA 139 08/14/2015   K 4.0 08/14/2015   CL 104 08/14/2015   CREATININE 0.95 08/14/2015   BUN 15 08/14/2015   CO2 27 08/14/2015   TSH 1.48 04/10/2015   PSA 2.19 12/06/2007   INR 3.02* 09/01/2015   HGBA1C 6.6* 04/10/2015   MICROALBUR 4.1* 04/10/2015     Last BNP: 498 - June 26, 2015; I dont see other results to compare on chart  Most recent echo summary: Transthoracic Echocardiography  Patient: Quamere, Lemas MR #: [id number] Study Date: 07/03/2015 Gender: M Age: 73 Height: 172.7 cm Weight: 101.8 kg BSA: 2.25 m^2 Pt. Status: Room:  Faylene Million, [place] Richardson Dopp T SONOGRAPHER Wyatt Mage, RDCS ATTENDING Lyman Bishop MD PERFORMING [facility], Outpatient  cc:  ------------------------------------------------------------------- LV EF: 55% - 60%  ------------------------------------------------------------------- Indications: CHF (I50.33).  ------------------------------------------------------------------- History: PMH: Syncope. Atrial  fibrillation. Congestive heart failure. Risk factors: Pulmonary hypertension. Anemia. Bradycardia. Former tobacco use. Hypertension. Diabetes mellitus. Dyslipidemia. Ascending aortic dissection.  ------------------------------------------------------------------- Study Conclusions  - Left ventricle: The cavity size was normal. Wall thickness was  increased in a pattern of mild LVH. Systolic function was normal.  The estimated ejection fraction was in the range of 55% to 60%.  The study is not technically sufficient to allow evaluation of LV  diastolic function. - Aortic valve: Mildly calcified leaflets. Moderate stenosis. Mean  gradient (S): 16 mm Hg. Peak gradient (S): 29 mm Hg. Valve area  (VTI): 1.09 cm^2. Valve area (Vmax): 1.2 cm^2. - Mitral valve: Calcified annulus. Mildly thickened leaflets .  There was mild regurgitation. - Left atrium: Massively dilated at 71 ml/m2. - Right ventricle: The cavity size was moderately dilated. Systolic  function is low normal. - Right atrium: Massively dilated at 50 cm2. - Tricuspid valve: There was moderate regurgitation. - Pulmonary arteries: PA peak pressure: 64 mm Hg (S). - Inferior vena cava: The vessel was dilated. The respirophasic  diameter changes were blunted (< 50%), consistent with elevated  central venous pressure.  Impressions:  - Compared to a prior echo in 2015, there is now moderate aortic  stenosis with AVA around 1.1 cm2.    Assessment & Plan:

## 2015-09-07 NOTE — Assessment & Plan Note (Signed)
Possibly multifactorial elements as well - including sympathetic swelling related to the right knee effusion, CHF vs other; for tx as above

## 2015-09-08 ENCOUNTER — Encounter: Payer: Self-pay | Admitting: Internal Medicine

## 2015-09-09 ENCOUNTER — Encounter: Payer: Self-pay | Admitting: Internal Medicine

## 2015-09-19 ENCOUNTER — Encounter: Payer: Self-pay | Admitting: Cardiology

## 2015-09-19 ENCOUNTER — Encounter (INDEPENDENT_AMBULATORY_CARE_PROVIDER_SITE_OTHER): Payer: Self-pay

## 2015-09-19 ENCOUNTER — Ambulatory Visit (INDEPENDENT_AMBULATORY_CARE_PROVIDER_SITE_OTHER): Payer: Medicare Other | Admitting: Cardiology

## 2015-09-19 VITALS — BP 114/58 | HR 84 | Wt 226.0 lb

## 2015-09-19 DIAGNOSIS — I5033 Acute on chronic diastolic (congestive) heart failure: Secondary | ICD-10-CM | POA: Insufficient documentation

## 2015-09-19 DIAGNOSIS — I481 Persistent atrial fibrillation: Secondary | ICD-10-CM

## 2015-09-19 DIAGNOSIS — I10 Essential (primary) hypertension: Secondary | ICD-10-CM

## 2015-09-19 DIAGNOSIS — I7101 Dissection of ascending aorta: Secondary | ICD-10-CM

## 2015-09-19 DIAGNOSIS — I4819 Other persistent atrial fibrillation: Secondary | ICD-10-CM

## 2015-09-19 DIAGNOSIS — I350 Nonrheumatic aortic (valve) stenosis: Secondary | ICD-10-CM | POA: Diagnosis not present

## 2015-09-19 DIAGNOSIS — I272 Other secondary pulmonary hypertension: Secondary | ICD-10-CM | POA: Diagnosis not present

## 2015-09-19 MED ORDER — IRON 325 (65 FE) MG PO TABS: 325.0000 mg | ORAL_TABLET | Freq: Every day | ORAL | Status: DC

## 2015-09-19 NOTE — Progress Notes (Signed)
Patient ID: Eric Rivers, male   DOB: 1930-12-14, 82 y.o.   MRN: [id number]      Cardiology Office Note  Date:  09/19/2015   ID:  Eric Rivers, Eric Rivers 10/12/1930, MRN [id number]  PCP:  Eric Cower, MD  Cardiologist:  Dr Eric Rivers --> Eric Dawley, MD   No chief complaint on file.  History of Present Illness: Eric Rivers is a 82 y.o. male who presents for 5 months follow up for CHF. The patient is a very pleasant gentleman with chronic atrial fibrillation status post emergent surgery for ascending aortic dissection 2014 with reimplantation of his coronary arteries. He has chronic stable exertional shortness of breath. He has no PND or orthopnea. He is going about his usual activities. He is not having any chest pain. The patient enjoyed life he is traveling around the world, he went to [place] cruise in March where he developed hematuria that was followed Eric Rivers cystoscopy and embolization of bleeding vessel in April 2017. His hematuria has since then resolved.  He has been doing okay until recently when he developed worsening lower extremity edema to the point that they are painful and also problems predating even at rest. He denies palpitation chest pain or syncope. He has been compliant to his Rivers and doesn't have any side effects. He denies any blood in her stool or black tarry stools and states that he had colonoscopy done just 2 years ago.   Past Medical History  Diagnosis Date  . Whooping cough   . Atrial fibrillation (HCC)      Chronic,   24 hour holter, September, 2011.... atrial fib rate is controlled.... there is some bradycardia but  no marked pauses  . Diabetes mellitus     type 2  . Hypertension   . DDD (degenerative disc disease)     lumbar spine  . GERD (gastroesophageal reflux disease)   . Gout   . Fluid overload   . ACE-inhibitor cough   . Shortness of breath     on exertion  . Aortic stenosis     mild....echo... september... 2010/ mild... echo.[name] KitchenMarland KitchenDec,  2011  . Pulmonary hypertension (Eric Rivers)     24mmHg, echo, 01/2010  . Hyperlipidemia   . Hx of colonoscopy     approx. 10 years  . Warfarin anticoagulation     Atrial fib  . Ejection fraction     EF 60%, echo, 01/2010  . Normal nuclear stress test     06/2005 , also ABI normal 2007  . Hypercholesterolemia   . Fracture of metacarpal bone 11/30/11  . Aortic dissection [facility])     Surgical repair February, 2014  . Mitral regurgitation   . Diabetes mellitus with neuropathy (Eric Rivers) 12/04/2006    Qualifier: Diagnosis of  Eric Rivers: Eric Rivers - Eric Rivers   . Bladder cancer Eric Rivers)     recurrence with TUR-B March '09  . Prostate cancer (Eric Rivers)     radiation tx.  . Diverticulosis of colon with hemorrhage 01/01/2014  . History of echocardiogram     Echo 5/17 - mild LVH, EF 55-60%, mod AS (mean 16 mmHg, peak 29 mmHg), MAC, midl MR, massive BAE, mod dilated RV, low normal RVSF, mod TR, PASP 64 mmHg    Past Surgical History  Procedure Laterality Date  . Lumbar laminectomy      '02  . Tur-bt '06, '09    . Bladder cancer biopsy  10/16/2010    negative for malignancy  .  Bladder microscopic  04/13/2007    high grade papillary urothelial lesions  . 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 Prostate biopsy  09/25/2011    GLEASON 3+3=6 AND 4+3=7  . Ascending aortic root replacement N/A 04/15/2012    Procedure: ASCENDING AORTIC ROOT REPLACEMENT;  Surgeon: Eric Pollack, MD;  Location: [facility];  Service: Open Heart Surgery;  Laterality: N/A;  . Tee without cardioversion N/A 04/15/2012    Procedure: TRANSESOPHAGEAL ECHOCARDIOGRAM (TEE);  Surgeon: Eric Pollack, MD;  Location: [facility];  Service: Open Heart Surgery;  Laterality: N/A;  . Exploration post operative open heart  04/2012  . Cystoscopy/retrograde/ureteroscopy Bilateral 04/04/2013    Procedure: CYSTOSCOPY WITH RETROGRADE PYELOGRAM AND BLADDER BIOPSY;  Surgeon: Eric Hazard, MD;  [facility];   Service: Urology;  Laterality: Bilateral;  . Herniatic disc surgery    . Colonoscopy N/A 01/01/2014    Procedure: COLONOSCOPY;  Surgeon: Eric Mayer, MD;  [facility];  Service: Endoscopy;  Laterality: N/A;  . Transurethral resection of bladder tumor N/A 06/13/2015    Procedure: TRANSURETHRAL RESECTION OF PROSTATE (TURP) CLOT EVACUATION AND FULGERATION, BLADDER STONE REMOVAL ;  Surgeon: Eric Frock, MD;  [facility];  Service: Urology;  Laterality: N/A;   Current Outpatient Prescriptions  Medication Sig Dispense Refill  . acetaminophen (TYLENOL) 500 MG tablet Take 1,000 mg Eric Rivers mouth 3 (three) times daily as needed for mild pain, moderate pain, fever or headache.     . allopurinol (ZYLOPRIM) 300 MG tablet Take 1 tablet (300 mg total) Eric Rivers mouth daily. 90 tablet 1  . amLODipine (NORVASC) 2.5 MG tablet Take 1 tablet (2.5 mg total) Eric Rivers mouth daily. 90 tablet 3  . aspirin EC 325 MG tablet Take 325 mg Eric Rivers mouth daily.    [name] Kitchen atorvastatin (LIPITOR) 20 MG tablet Take 1 tablet (20 mg total) Eric Rivers mouth daily. 30 tablet 3  . Calcium Carb-Cholecalciferol (CALCIUM 600 + D PO) Take 1 tablet Eric Rivers mouth at bedtime.     [name] Kitchen desonide (DESOWEN) 0.05 % lotion Apply 1 application topically daily as needed (rosacea).     . finasteride (PROSCAR) 5 MG tablet Take 1 tablet (5 mg total) Eric Rivers mouth every morning. 90 tablet 3  . furosemide (LASIX) 40 MG tablet Take 1 tablet (40 mg total) Eric Rivers mouth daily. 30 tablet   . ketoconazole (NIZORAL) 2 % shampoo Apply 1 application topically 4 (four) times a week.    . mirabegron ER (MYRBETRIQ) 50 MG TB24 tablet Take 50 mg Eric Rivers mouth daily.    . Multiple Vitamins-Minerals (ONCOVITE PO) Take 1 tablet Eric Rivers mouth daily.     . pantoprazole (PROTONIX) 40 MG tablet Take 1 tablet (40 mg total) Eric Rivers mouth daily. 90 tablet 1  . potassium chloride (K-DUR,KLOR-CON) 10 MEQ tablet Take 1 tablet (10 mEq total) Eric Rivers mouth daily. 90 tablet 3  . senna (SENOKOT) 8.6 MG tablet Take 1 tablet Eric Rivers mouth at  bedtime.     . sitaGLIPtin (JANUVIA) 100 MG tablet Take 100 mg Eric Rivers mouth daily.    . Tamsulosin HCl (FLOMAX) 0.4 MG CAPS Take 0.4 mg Eric Rivers mouth at bedtime.     . traZODone (DESYREL) 50 MG tablet Take 1 tablet (50 mg total) Eric Rivers mouth as directed. 30 tablet 3   No current facility-administered Rivers for this visit.    Allergies:   Metoprolol    Social History:  The patient  reports that he quit smoking about  40 years ago. His smoking use included Cigarettes. He smoked 1.00 pack per day. He has never used smokeless tobacco. He reports that he drinks about 7.0 oz of alcohol per week. He reports that he does not use illicit drugs.   Family History:  The patient's family history includes Cancer in his father; Heart attack in his mother; Heart disease in his mother; Hypertension in his mother; Prostate cancer (age of onset: 40) in his father; Stroke in his father.    ROS:  Please see the history of present illness.   Otherwise, review of systems are positive for none.   All other systems are reviewed and negative.    PHYSICAL EXAM: VS:  BP 114/58 mmHg  Pulse 84  Wt 226 lb (102.513 kg)  SpO2 93% , BMI Body mass index is 34.37 kg/(m^2). GEN: Well nourished, well developed, labored breathing while talking HEENT: normal Neck: no JVD, carotid bruits, or masses Cardiac: iRRR; 2/6 murmurs, rubs, or gallops, 3+ pitting lower extremity edema up to his knees edema  Respiratory:  crackles at both bases and labored breathing GI: soft, nontender, nondistended, + BS MS: no deformity or atrophy Skin: warm and dry, no rash Neuro:  Strength and sensation are intact Psych: euthymic mood, full affect  EKG:  EKG is not ordered today. The ekg at the last visit showed A-fib, 76/minute, LAFB. Anterior MI, age undetermined  Recent Labs: 04/10/2015: TSH 1.48 06/26/2015: Brain Natriuretic Peptide 498.2* 09/07/2015: ALT 12; BUN 20; Creatinine, Ser 0.95; Hemoglobin 8.4 Repeated and verified X2.*; Platelets 264.0;  Potassium 4.4; Pro B Natriuretic peptide (BNP) 566.0*; Sodium 138   Lipid Panel    Component Value Date/Time   CHOL 102 04/10/2015 1230   TRIG 57.0 04/10/2015 1230   HDL 45.60 04/10/2015 1230   CHOLHDL 2 04/10/2015 1230   VLDL 11.4 04/10/2015 1230   LDLCALC 45 04/10/2015 1230   Wt Readings from Last 3 Encounters:  09/19/15 226 lb (102.513 kg)  09/07/15 225 lb (102.059 kg)  07/09/15 225 lb (102.059 kg)    TTE: 07/2015 - Left ventricle: The cavity size was normal. Wall thickness was  increased in a pattern of mild LVH. Systolic function was normal.  The estimated ejection fraction was in the range of 55% to 60%.  The study is not technically sufficient to allow evaluation of LV  diastolic function. - Aortic valve: Mildly calcified leaflets. Moderate stenosis. Mean  gradient (S): 16 mm Hg. Peak gradient (S): 29 mm Hg. Valve area  (VTI): 1.09 cm^2. Valve area (Vmax): 1.2 cm^2. - Mitral valve: Calcified annulus. Mildly thickened leaflets .  There was mild regurgitation. - Left atrium: Massively dilated at 71 ml/m2. - Right ventricle: The cavity size was moderately dilated. Systolic  function is low normal. - Right atrium: Massively dilated at 50 cm2. - Tricuspid valve: There was moderate regurgitation. - Pulmonary arteries: PA peak pressure: 64 mm Hg (S). - Inferior vena cava: The vessel was dilated. The respirophasic  diameter changes were blunted (< 50%), consistent with elevated  central venous pressure.  Impressions: Based to a prior echo in 2015, there is now moderate aortic  stenosis with AVA around 1.1 cm2.   ASSESSMENT AND PLAN:  1. Acute on chronic diastolic CHF, patient is significantly fluid overloaded on physical exam despite no significant weight gain, I will increase Lasix, he is currently taking 40 mg daily, I will increase to 80 mg in the morning and 40 mg in the afternoon. He needs to be followed  within 5-7 days, he is instructed to go to the ER if  his symptoms get worse as he is right now short of breath at rest and has very labored breathing. - We will schedule a follow-up appointment with a PA next Wednesday, July 26 also check CMP, BNP, CBC at that time.  2. Aortic stenosis - most recent echo in May 2017 showed progression of aortic stenosis currently in moderate range. Mean gradient 16 mmHg however, later area 1.1 cm.   3. Severe microcytic anemia - even after resolution of his hematuria, we will check his CBC next Wednesday meanwhile I'll start him on low-dose iron supplements 325 mg daily of iron sulfate.  4. Ascending aortic dissection - s/p emergent ascending aorta replacement with a graft in 2014. He had supra-coronary attachment of this graft. Stable on most recent echocardiogram.  5. Chronic a-fib - rate controlled, on chronic anticoagulation with warfarin, with anemia however no obvious bleeding at this point we will start on iron supplements and follow-up.  6. Severe pulmonary hypertension - stable, continue amlodipine.  Follow up in 1 week.  Signed, Eric Dawley, MD  09/19/2015 12:05 PM    [address], [place], [place]  [address] Phone: [phone]; Fax: [phone]

## 2015-09-19 NOTE — Patient Instructions (Addendum)
Medication Instructions:  Your physician has recommended you make the following change in your medication:  1) INCREASE Lasix to 80mg  in the am and 40mg  in the pm for 5 days 2) START Iron Fe 325mg  an Rx has been sent to your pharmacy    Labwork: Your physician recommends that you return for lab work on the same day as your appointment (Bmet, [place], Lft, Bnp)   Testing/Procedures: None ordered  Follow-Up: Your physician recommends that you schedule a follow-up appointment on 7/24 or 09/25/15 with an APP  Any Other Special Instructions Will Be Listed Below (If Applicable).     If you need a refill on your cardiac medications before your next appointment, please call your pharmacy.

## 2015-09-20 ENCOUNTER — Other Ambulatory Visit: Payer: Self-pay

## 2015-09-20 ENCOUNTER — Ambulatory Visit (INDEPENDENT_AMBULATORY_CARE_PROVIDER_SITE_OTHER): Payer: Medicare Other | Admitting: Family Medicine

## 2015-09-20 VITALS — BP 148/84 | HR 78 | Wt 225.0 lb

## 2015-09-20 DIAGNOSIS — R609 Edema, unspecified: Secondary | ICD-10-CM

## 2015-09-20 DIAGNOSIS — M216X1 Other acquired deformities of right foot: Secondary | ICD-10-CM | POA: Diagnosis not present

## 2015-09-20 DIAGNOSIS — M216X2 Other acquired deformities of left foot: Secondary | ICD-10-CM

## 2015-09-20 DIAGNOSIS — M1711 Unilateral primary osteoarthritis, right knee: Secondary | ICD-10-CM | POA: Insufficient documentation

## 2015-09-20 DIAGNOSIS — M25561 Pain in right knee: Secondary | ICD-10-CM | POA: Diagnosis not present

## 2015-09-20 NOTE — Progress Notes (Signed)
[name] [address], [place] [address] Phone: [phone] Subjective:    I'm seeing this patient by the request  of:  Cathlean Cower, MD  CC: Right knee pain  [id number] Eric Rivers is a 82 y.o. male coming in with complaint of right knee pain. Patient is had this pain for quite some time.seen by primary care provider 2 weeks ago. Did have x-rays. Independently visualized by me. Patient does have mild to moderate tricompartmental osteoarthritic changes as well as CPPD. Patient states the pain seems to be mild to moderate. Patient does not do a lot of walking but when he does he has severe amount of pain. Seems to be worse when he is barefooted. Patient states he has noticed more swelling in his legs. We recently saw his cardiologist and is increasing his Lasix dose. Has not taken it today. Patient states that he does not know if it swelling or from the pain in the knee itself leg keeps him from walking. States that at the end of the day as well as at night it seems to be worse. Rates the severity of pain a 6 out of 10.     Past Medical History  Diagnosis Date  . Whooping cough   . Atrial fibrillation (HCC)      Chronic,   24 hour holter, September, 2011.... atrial fib rate is controlled.... there is some bradycardia but  no marked pauses  . Diabetes mellitus     type 2  . Hypertension   . DDD (degenerative disc disease)     lumbar spine  . GERD (gastroesophageal reflux disease)   . Gout   . Fluid overload   . ACE-inhibitor cough   . Shortness of breath     on exertion  . Aortic stenosis     mild....echo... september... 2010/ mild... echo.[name] KitchenMarland KitchenDec, 2011  . Pulmonary hypertension ([place])     46mmHg, echo, 01/2010  . Hyperlipidemia   . Hx of colonoscopy     approx. 10 years  . Warfarin anticoagulation     Atrial fib  . Ejection fraction     EF 60%, echo, 01/2010  . Normal nuclear stress test     06/2005 , also ABI normal 2007  .  Hypercholesterolemia   . Fracture of metacarpal bone 11/30/11  . Aortic dissection [facility])     Surgical repair February, 2014  . Mitral regurgitation   . Diabetes mellitus with neuropathy ([place]) 12/04/2006    Qualifier: Diagnosis of  By: Linda Hedges MD, Heinz Knuckles  medications - DPP4   . Bladder cancer [facility])     recurrence with TUR-B March '09  . Prostate cancer ([place])     radiation tx.  . Diverticulosis of colon with hemorrhage 01/01/2014  . History of echocardiogram     Echo 5/17 - mild LVH, EF 55-60%, mod AS (mean 16 mmHg, peak 29 mmHg), MAC, midl MR, massive BAE, mod dilated RV, low normal RVSF, mod TR, PASP 64 mmHg   Past Surgical History  Procedure Laterality Date  . Lumbar laminectomy      '02  . Tur-bt '06, '09    . Bladder cancer biopsy  10/16/2010    negative for malignancy  . Bladder microscopic  04/13/2007    high grade papillary urothelial lesions  . 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 Prostate biopsy  09/25/2011    GLEASON 3+3=6 AND 4+3=7  .  Ascending aortic root replacement N/A 04/15/2012    Procedure: ASCENDING AORTIC ROOT REPLACEMENT;  Surgeon: Gaye Pollack, MD;  Location: [facility];  Service: Open Heart Surgery;  Laterality: N/A;  . Tee without cardioversion N/A 04/15/2012    Procedure: TRANSESOPHAGEAL ECHOCARDIOGRAM (TEE);  Surgeon: Gaye Pollack, MD;  Location: [facility];  Service: Open Heart Surgery;  Laterality: N/A;  . Exploration post operative open heart  04/2012  . Cystoscopy/retrograde/ureteroscopy Bilateral 04/04/2013    Procedure: CYSTOSCOPY WITH RETROGRADE PYELOGRAM AND BLADDER BIOPSY;  Surgeon: Molli Hazard, MD;  [facility];  Service: Urology;  Laterality: Bilateral;  . Herniatic disc surgery    . Colonoscopy N/A 01/01/2014    Procedure: COLONOSCOPY;  Surgeon: Gatha Mayer, MD;  [facility];  Service: Endoscopy;  Laterality: N/A;  . Transurethral resection of bladder tumor N/A 06/13/2015    Procedure:  TRANSURETHRAL RESECTION OF PROSTATE (TURP) CLOT EVACUATION AND FULGERATION, BLADDER STONE REMOVAL ;  Surgeon: Alexis Frock, MD;  [facility];  Service: Urology;  Laterality: N/A;   Social History   Social History  . Marital Status: Single    Spouse Name: N/A  . Number of Children: 0  . Years of Education: 59   Occupational History  . history and Advice worker     retired   Social History Main Topics  . Smoking status: Former Smoker -- 1.00 packs/day    Types: Cigarettes    Quit date: 03/04/1975  . Smokeless tobacco: Never Used  . Alcohol Use: 7.0 oz/week    14 drink(s) per week     Comment: liquor daily  . Drug Use: No  . Sexual Activity: Not Currently   Other Topics Concern  . Not on file   Social History Narrative   Confirmed batchelor. Retired Education officer, museum. World traveler- Ambulance person. I- ADLS. End of life Care   No CPR, no prolonged intubation, no prolonged artificial hydration or feeding, no heroic or futile measures.       Next trip Sept [place]13 [place]      Last cruise - two cruises back to back to the [place] in January '15. May'15: [place] with stops in [place], [place], [place], [place], [place]. 15 days      Fall '15: 50 Days from [place]. [place], [place], [place], [place], [place], [place], [place], [place].       May '15 - 15 days up the [employer] as far as [place]            Allergies  Allergen Reactions  . Metoprolol Other (See Comments)    Marked bradycardia at 25 mg bid   Family History  Problem Relation Age of Onset  . Prostate cancer Father 45    passed with prostate ca  . Cancer Father   . Stroke Father   . Heart disease Mother   . Hypertension Mother   . Heart attack Mother     Past medical history, social, surgical and family history all reviewed in electronic medical record.  No pertanent information unless stated regarding to the  chief complaint.   Review of Systems: No headache, visual changes, nausea, vomiting, diarrhea, constipation, dizziness, abdominal pain, skin rash, fevers, chills, night sweats, weight loss, swollen lymph nodes,, chest pain, mood changes.   Objective Blood pressure 148/84, pulse 78, weight 225 lb (102.059 kg).  General: No apparent distress alert and oriented x3 mood  and affect normal, dressed appropriately. obese HEENT: Pupils equal, extraocular movements intact  Respiratory: Patient's speak in full sentences and does appear short of breath  Cardiovascular: 3+lower extremity edema,  tender, no erythema significant hemosiderin deposits. Skin: Warm dry intact with no signs of infection or rash on extremities or on axial skeleton.  Abdomen: Soft nontender  Neuro: Cranial nerves II through XII are intact, neurovascularly intact in all extremities with 2+ DTRs and 2+ pulses.  Lymph: No lymphadenopathy of posterior or anterior cervical chain or axillae bilaterally.  Gait antalgic gait MSK:  Non tender with full range of motion and good stability and symmetric strength and tone of shoulders, elbows, wrist, hip, and ankles bilaterally.  Knee:right Difficult to assess secondary to patient's body habitus severe 3+ pitting edema of the lower extremities to mid thighs significant amount of hemosiderin deposits of the lower legs bilaterally Mild tenderness to palpation over the medial joint line Lacks last 10 of flexion. mildinstability with valgus force Patellar and quadriceps tendons unremarkable. 4 out of 5 hamstring and quadriceps strength but symmetric to the contralateral side .severe over pronation of the hindfoot bilaterally    MSK US performed of: right This study was ordered, performed, and interpreted by Charlann Boxer D.O.  Knee: All structures visualized. Degenerative arthritic changes of the knee medially and laterally moderate to severe in nature Patellar Tendon unremarkable on long and  transverse views without effusion. No abnormality of prepatellar bursa. LCL and MCL unremarkable on long and transverse views. No abnormality of origin of medial or lateral head of the gastrocnemius.  IMPRESSION:  Arthritic changes of the knee with overlying soft tissue swelling  Impression and Recommendations:     This case required medical decision making of moderate complexity.      Note: This dictation was prepared with Dragon dictation along with smaller phrase technology. Any transcriptional errors that result from this process are unintentional.

## 2015-09-20 NOTE — Assessment & Plan Note (Signed)
Likely genitive pain. We discussed compression, home exercises, we discussed the Lasix and how this will be beneficial. Patient and will come back and see me again in 4 weeks. Patient continued follow-up with his cardiologist will be making the changes of the medication.

## 2015-09-20 NOTE — Assessment & Plan Note (Signed)
I do believe the patient does have degenerative arthritis of the knee. We discussed icing regimen, home exercises, which activities doing which ones to potentially avoid. We discussed topical anti-inflammatories but will watch for any type of bruising. Discussed with patient that I do feel that the peripheral edema is also contributing and patient's increasing his Lasix likely will be beneficial. Patient also has severe over pronation of the feet. Encouraged over-the-counter orthotics. Patient declined injection today. Patient come back again in 4 weeks for further evaluation and treatment.

## 2015-09-20 NOTE — Patient Instructions (Addendum)
Good to see you.  I do think the lasix will help Ice 20 minutes 2 times daily. Usually after activity and before bed. pennsaid pinkie amount topically 2 times daily as needed. Stop if it hurts your stomach or easy bruising.  Vitamin D 2000 IU daily  Tart cherry extract at night any dose.  Avoid twisting motions if you can.  Good shoes with rigid bottom.  Jalene Mullet, Merrell or New balance greater then 700 Spenco orthotics "total support" online would be great  Lets see how you do before exercises.  See em again in 4 weeks if not better and we can try injections.

## 2015-09-26 ENCOUNTER — Ambulatory Visit: Payer: Medicare Other | Admitting: Nurse Practitioner

## 2015-09-26 ENCOUNTER — Ambulatory Visit (INDEPENDENT_AMBULATORY_CARE_PROVIDER_SITE_OTHER): Payer: Medicare Other | Admitting: Nurse Practitioner

## 2015-09-26 ENCOUNTER — Encounter (INDEPENDENT_AMBULATORY_CARE_PROVIDER_SITE_OTHER): Payer: Self-pay

## 2015-09-26 ENCOUNTER — Ambulatory Visit (INDEPENDENT_AMBULATORY_CARE_PROVIDER_SITE_OTHER): Payer: Medicare Other | Admitting: General Practice

## 2015-09-26 ENCOUNTER — Telehealth: Payer: Self-pay | Admitting: Hematology

## 2015-09-26 ENCOUNTER — Other Ambulatory Visit (INDEPENDENT_AMBULATORY_CARE_PROVIDER_SITE_OTHER): Payer: Medicare Other

## 2015-09-26 ENCOUNTER — Encounter: Payer: Self-pay | Admitting: Nurse Practitioner

## 2015-09-26 VITALS — BP 148/70 | HR 68 | Ht 68.0 in | Wt 213.4 lb

## 2015-09-26 DIAGNOSIS — I272 Other secondary pulmonary hypertension: Secondary | ICD-10-CM | POA: Diagnosis not present

## 2015-09-26 DIAGNOSIS — I5033 Acute on chronic diastolic (congestive) heart failure: Secondary | ICD-10-CM | POA: Diagnosis not present

## 2015-09-26 DIAGNOSIS — I482 Chronic atrial fibrillation, unspecified: Secondary | ICD-10-CM

## 2015-09-26 DIAGNOSIS — D509 Iron deficiency anemia, unspecified: Secondary | ICD-10-CM

## 2015-09-26 DIAGNOSIS — Z7901 Long term (current) use of anticoagulants: Secondary | ICD-10-CM

## 2015-09-26 DIAGNOSIS — I350 Nonrheumatic aortic (valve) stenosis: Secondary | ICD-10-CM

## 2015-09-26 DIAGNOSIS — Z5181 Encounter for therapeutic drug level monitoring: Secondary | ICD-10-CM | POA: Diagnosis not present

## 2015-09-26 LAB — CBC WITH DIFFERENTIAL/PLATELET
Basophils Absolute: 74 cells/uL (ref 0–200)
Basophils Relative: 1 %
Eosinophils Absolute: 148 cells/uL (ref 15–500)
Eosinophils Relative: 2 %
HCT: 30.9 % — ABNORMAL LOW (ref 38.5–50.0)
Hemoglobin: 8.7 g/dL — ABNORMAL LOW (ref 13.2–17.1)
Lymphocytes Relative: 13 %
Lymphs Abs: 962 cells/uL (ref 850–3900)
MCH: 21.2 pg — ABNORMAL LOW (ref 27.0–33.0)
MCHC: 28.2 g/dL — ABNORMAL LOW (ref 32.0–36.0)
MCV: 75.4 fL — ABNORMAL LOW (ref 80.0–100.0)
MPV: 8.7 fL (ref 7.5–12.5)
Monocytes Absolute: 666 cells/uL (ref 200–950)
Monocytes Relative: 9 %
Neutro Abs: 5550 cells/uL (ref 1500–7800)
Neutrophils Relative %: 75 %
Platelets: 289 10*3/uL (ref 140–400)
RBC: 4.1 MIL/uL — ABNORMAL LOW (ref 4.20–5.80)
RDW: 20.4 % — ABNORMAL HIGH (ref 11.0–15.0)
WBC: 7.4 10*3/uL (ref 3.8–10.8)

## 2015-09-26 LAB — BRAIN NATRIURETIC PEPTIDE: Brain Natriuretic Peptide: 334 pg/mL — ABNORMAL HIGH (ref <100)

## 2015-09-26 LAB — BASIC METABOLIC PANEL
BUN: 21 mg/dL (ref 7–25)
CO2: 28 mmol/L (ref 20–31)
Calcium: 9.6 mg/dL (ref 8.6–10.3)
Chloride: 97 mmol/L — ABNORMAL LOW (ref 98–110)
Creat: 1.01 mg/dL (ref 0.70–1.11)
Glucose, Bld: 135 mg/dL — ABNORMAL HIGH (ref 65–99)
Potassium: 4 mmol/L (ref 3.5–5.3)
Sodium: 140 mmol/L (ref 135–146)

## 2015-09-26 LAB — POCT INR: INR: 2

## 2015-09-26 LAB — HEPATIC FUNCTION PANEL
ALT: 17 U/L (ref 9–46)
AST: 22 U/L (ref 10–35)
Albumin: 4.1 g/dL (ref 3.6–5.1)
Alkaline Phosphatase: 89 U/L (ref 40–115)
Bilirubin, Direct: 0.4 mg/dL — ABNORMAL HIGH (ref <=0.2)
Indirect Bilirubin: 0.6 mg/dL (ref 0.2–1.2)
Total Bilirubin: 1 mg/dL (ref 0.2–1.2)
Total Protein: 7.6 g/dL (ref 6.1–8.1)

## 2015-09-26 NOTE — Progress Notes (Signed)
CARDIOLOGY OFFICE NOTE  Date:  09/26/2015    Eric Rivers Date of Birth: 24-Jan-1931 Medical Record [id number]  PCP:  Cathlean Cower, MD  Cardiologist:  Meda Coffee  Chief Complaint  Patient presents with  . Congestive Heart Failure  . Cardiac Valve Problem  . Atrial Fibrillation    One week check - seen for Dr. Meda Coffee    History of Present Illness: Eric Rivers is a 82 y.o. male who presents today for a one week check. Seen for Dr. Meda Coffee.   He has a history of known diastolic HF, chronic AF, status post emergent surgery for ascending aortic dissection back in 2014 with reimplantation of his coronary arteries. He has chronic stable exertional shortness of breath. Other issues include aortic stenosis, chronic anemia, bladder cancer, DM, and GERD. Other issues as noted below.   Seen a week ago - felt to have some volume overload. Lasix was increased. His last HGB was 8.7. He has been started on iron.   Comes in today. Here alone. He is to doing well. Weight is way down. Stable DOE. Less swelling. No chest pain. Not dizzy or lightheaded. No real clear reason of his anemia - looks very chronic - says that he has had referral to hematology "talked about". He would like to go. No active bleeding noted. Remains on his coumadin. For labs today that have already been ordered by Dr. Meda Coffee.   Past Medical History:  Diagnosis Date  . ACE-inhibitor cough   . Aortic dissection [facility])    Surgical repair February, 2014  . Aortic stenosis    mild....echo... september... 2010/ mild... echo.[name] KitchenMarland KitchenDec, 2011  . Atrial fibrillation (HCC)     Chronic,   24 hour holter, September, 2011.... atrial fib rate is controlled.... there is some bradycardia but  no marked pauses  . Bladder cancer [facility])    recurrence with TUR-B March '09  . DDD (degenerative disc disease)    lumbar spine  . Diabetes mellitus    type 2  . Diabetes mellitus with neuropathy ([place]) 12/04/2006   Qualifier: Diagnosis of  By:  Linda Hedges MD, Heinz Knuckles  medications - DPP4   . Diverticulosis of colon with hemorrhage 01/01/2014  . Ejection fraction    EF 60%, echo, 01/2010  . Fluid overload   . Fracture of metacarpal bone 11/30/11  . GERD (gastroesophageal reflux disease)   . Gout   . History of echocardiogram    Echo 5/17 - mild LVH, EF 55-60%, mod AS (mean 16 mmHg, peak 29 mmHg), MAC, midl MR, massive BAE, mod dilated RV, low normal RVSF, mod TR, PASP 64 mmHg  . Hx of colonoscopy    approx. 10 years  . Hypercholesterolemia   . Hyperlipidemia   . Hypertension   . Mitral regurgitation   . Normal nuclear stress test    06/2005 , also ABI normal 2007  . Prostate cancer ([place])    radiation tx.  . Pulmonary hypertension ([place])    27mmHg, echo, 01/2010  . Shortness of breath    on exertion  . Warfarin anticoagulation    Atrial fib  . Whooping cough     Past Surgical History:  Procedure Laterality Date  . ASCENDING AORTIC ROOT REPLACEMENT N/A 04/15/2012   Procedure: ASCENDING AORTIC ROOT REPLACEMENT;  Surgeon: Gaye Pollack, MD;  Location: [facility];  Service: Open Heart Surgery;  Laterality: N/A;  . bladder cancer biopsy  10/16/2010   negative for malignancy  . bladder  microscopic  04/13/2007   high grade papillary urothelial lesions  . COLONOSCOPY N/A 01/01/2014   Procedure: COLONOSCOPY;  Surgeon: Gatha Mayer, MD;  [facility];  Service: Endoscopy;  Laterality: N/A;  . CYSTOSCOPY/RETROGRADE/URETEROSCOPY Bilateral 04/04/2013   Procedure: CYSTOSCOPY WITH RETROGRADE PYELOGRAM AND BLADDER BIOPSY;  Surgeon: Molli Hazard, MD;  [facility];  Service: Urology;  Laterality: Bilateral;  . 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 EXPLORATION POST OPERATIVE OPEN HEART  04/2012  . Herniatic disc surgery    . LUMBAR LAMINECTOMY     '02  . PROSTATE BIOPSY  09/25/2011   GLEASON 3+3=6 AND 4+3=7  . TEE WITHOUT CARDIOVERSION N/A 04/15/2012   Procedure: TRANSESOPHAGEAL  ECHOCARDIOGRAM (TEE);  Surgeon: Gaye Pollack, MD;  Location: [facility];  Service: Open Heart Surgery;  Laterality: N/A;  . TRANSURETHRAL RESECTION OF BLADDER TUMOR N/A 06/13/2015   Procedure: TRANSURETHRAL RESECTION OF PROSTATE (TURP) CLOT EVACUATION AND FULGERATION, BLADDER STONE REMOVAL ;  Surgeon: Alexis Frock, MD;  [facility];  Service: Urology;  Laterality: N/A;  . TUR-BT '06, '09       Medications: Current Outpatient Prescriptions  Medication Sig Dispense Refill  . acetaminophen (TYLENOL) 500 MG tablet Take 1,000 mg by mouth 3 (three) times daily as needed for mild pain, moderate pain, fever or headache.     . allopurinol (ZYLOPRIM) 300 MG tablet Take 1 tablet (300 mg total) by mouth daily. 90 tablet 1  . amLODipine (NORVASC) 2.5 MG tablet Take 1 tablet (2.5 mg total) by mouth daily. 90 tablet 3  . aspirin EC 325 MG tablet Take 325 mg by mouth daily.    [name] Kitchen atorvastatin (LIPITOR) 20 MG tablet Take 1 tablet (20 mg total) by mouth daily. 30 tablet 3  . Calcium Carb-Cholecalciferol (CALCIUM 600 + D PO) Take 1 tablet by mouth at bedtime.     [name] Kitchen desonide (DESOWEN) 0.05 % lotion Apply 1 application topically daily as needed (rosacea).     . Ferrous Sulfate (IRON) 325 (65 Fe) MG TABS Take 325 mg by mouth daily. 30 each 5  . finasteride (PROSCAR) 5 MG tablet Take 1 tablet (5 mg total) by mouth every morning. 90 tablet 3  . furosemide (LASIX) 40 MG tablet Take 1 tablet (40 mg total) by mouth daily. 30 tablet   . ketoconazole (NIZORAL) 2 % shampoo Apply 1 application topically 4 (four) times a week.    . mirabegron ER (MYRBETRIQ) 50 MG TB24 tablet Take 50 mg by mouth daily.    . Multiple Vitamins-Minerals (ONCOVITE PO) Take 1 tablet by mouth daily.     . pantoprazole (PROTONIX) 40 MG tablet Take 1 tablet (40 mg total) by mouth daily. 90 tablet 1  . potassium chloride (K-DUR,KLOR-CON) 10 MEQ tablet Take 1 tablet (10 mEq total) by mouth daily. 90 tablet 3  . senna (SENOKOT) 8.6 MG tablet Take 1  tablet by mouth at bedtime.     . sitaGLIPtin (JANUVIA) 100 MG tablet Take 100 mg by mouth daily.    . Tamsulosin HCl (FLOMAX) 0.4 MG CAPS Take 0.4 mg by mouth at bedtime.     . traZODone (DESYREL) 50 MG tablet Take 1 tablet (50 mg total) by mouth as directed. 30 tablet 3  . warfarin (COUMADIN) 4 MG tablet Take 4 mg by mouth daily. Except thrusday (6mg  )  0   No current facility-administered medications for this visit.     Allergies: Allergies  Allergen Reactions  . Metoprolol Other (See Comments)    Marked bradycardia at 25 mg bid    Social History: The patient  reports that he quit smoking about 40 years ago. His smoking use included Cigarettes. He smoked 1.00 pack per day. He has never used smokeless tobacco. He reports that he drinks about 7.0 oz of alcohol per week . He reports that he does not use drugs.   Family History: The patient's family history includes Cancer in his father; Heart attack in his mother; Heart disease in his mother; Hypertension in his mother; Prostate cancer (age of onset: 86) in his father; Stroke in his father.   Review of Systems: Please see the history of present illness.   Otherwise, the review of systems is positive for none.   All other systems are reviewed and negative.   Physical Exam: VS:  BP (!) 148/70   Pulse 68   Ht 5\' 8"  (1.727 m)   Wt 213 lb 6.4 oz (96.8 kg)   SpO2 94% Comment: at rest  BMI 32.45 kg/m  .  BMI Body mass index is 32.45 kg/m.  Wt Readings from Last 3 Encounters:  09/26/15 213 lb 6.4 oz (96.8 kg)  09/20/15 225 lb (102.1 kg)  09/19/15 226 lb (102.5 kg)    General: Pleasant. Chronically ill appearing but alert and in no acute distress.  He has lost 12 pounds since his last visit.  HEENT: Normal.  Neck: Supple, no JVD, carotid bruits, or masses noted.  Cardiac: Irregular irregular rhythm. Rate is ok. Soft outflow murmur noted.  Trace edema. Brawny stasis changes in the legs.  Respiratory:  Lungs are clear to  auscultation bilaterally with normal work of breathing.  GI: Soft and nontender.  MS: No deformity or atrophy. Gait and ROM intact.  Skin: Warm and dry. Color is rather sallow.  Neuro:  Strength and sensation are intact and no gross focal deficits noted.  Psych: Alert, appropriate and with normal affect.   LABORATORY DATA:  EKG:  EKG is not ordered today.   Lab Results  Component Value Date   WBC 9.5 09/07/2015   HGB 8.4 Repeated and verified X2. (L) 09/07/2015   HCT 27.5 (L) 09/07/2015   PLT 264.0 09/07/2015   GLUCOSE 141 (H) 09/07/2015   CHOL 102 04/10/2015   TRIG 57.0 04/10/2015   HDL 45.60 04/10/2015   LDLCALC 45 04/10/2015   ALT 12 09/07/2015   AST 20 09/07/2015   NA 138 09/07/2015   K 4.4 09/07/2015   CL 101 09/07/2015   CREATININE 0.95 09/07/2015   BUN 20 09/07/2015   CO2 29 09/07/2015   TSH 1.48 04/10/2015   PSA 2.19 12/06/2007   INR 3.02 (H) 09/01/2015   HGBA1C 6.6 (H) 04/10/2015   MICROALBUR 4.1 (H) 04/10/2015    BNP (last 3 results)  Recent Labs  06/02/15 0629 06/26/15 1217  BNP 431.3* 498.2*    ProBNP (last 3 results)  Recent Labs  09/07/15 1107  PROBNP 566.0*     Other Studies Reviewed Today:  Echo Study Conclusions from 07/2015  - Left ventricle: The cavity size was normal. Wall thickness was   increased in a pattern of mild LVH. Systolic function was normal.   The estimated ejection fraction was in the range of 55% to 60%.   The study is not technically sufficient to allow evaluation of LV   diastolic function. - Aortic valve: Mildly calcified leaflets. Moderate stenosis. Mean   gradient (S): 16  mm Hg. Peak gradient (S): 29 mm Hg. Valve area   (VTI): 1.09 cm^2. Valve area (Vmax): 1.2 cm^2. - Mitral valve: Calcified annulus. Mildly thickened leaflets .   There was mild regurgitation. - Left atrium: Massively dilated at 71 ml/m2. - Right ventricle: The cavity size was moderately dilated. Systolic   function is low normal. - Right  atrium: Massively dilated at 50 cm2. - Tricuspid valve: There was moderate regurgitation. - Pulmonary arteries: PA peak pressure: 64 mm Hg (S). - Inferior vena cava: The vessel was dilated. The respirophasic   diameter changes were blunted (< 50%), consistent with elevated   central venous pressure.  Impressions:  - Compared to a prior echo in 2015, there is now moderate aortic   stenosis with AVA around 1.1 cm2.   ASSESSMENT AND PLAN:  1. Acute on chronic diastolic CHF - he is better clinically. Cutting Lasix back to 40 mg a day. Labs as previously ordered. Continue with sodium restriction as best he can (cooks himself and uses processed foods)  2. Aortic stenosis - most recent echo in May 2017 showed progression of aortic stenosis currently in moderate range. Mean gradient 16 mmHg however, later area 1.1 cm.   3. Severe microcytic anemia - has been started on iron. He would like to see Hematology - referral placed  4. Ascending aortic dissection - s/p emergent ascending aorta replacement with a graft in 2014. He had supra-coronary attachment of this graft. Stable on most recent echocardiogram.  5. Chronic a-fib - rate controlled, on chronic anticoagulation with warfarin, with anemia however no obvious bleeding at this point - lab today. Referral to hematology  6. Severe pulmonary hypertension - stable, continue amlodipine.  Current medicines are reviewed with the patient today.  The patient does not have concerns regarding medicines other than what has been noted above.  The following changes have been made:  See above.  Labs/ tests ordered today include:    Orders Placed This Encounter  Procedures  . Ambulatory referral to Hematology     Disposition:   FU with Dr. Meda Coffee and team in about 6 weeks.  Patient is agreeable to this plan and will call if any problems develop in the interim.   Signed: Burtis Junes, RN, ANP-C 09/26/2015 10:24 AM  [facility] [address] [place], [place]  [address] Phone: [phone] Fax: [phone]

## 2015-09-26 NOTE — Telephone Encounter (Signed)
Appointment for the patient was given to [facility] from Dr. Everrett Coombe office with Irene Limbo on 8/9 at 2pm. They will contact the patient about the appointment.

## 2015-09-26 NOTE — Addendum Note (Signed)
Addended by: Burtis Junes on: 09/26/2015 01:07 PM   Modules accepted: Orders

## 2015-09-26 NOTE — Patient Instructions (Addendum)
We will be checking the following labs today - CMET, CBC, BNP (ordered by Dr. Meda Coffee)   Medication Instructions:    Continue with your current medicines.BUT  I am cutting the Lasix back to just one pill a day     Testing/Procedures To Be Arranged:  N/A  Follow-Up:   See Dr. Ermalene Postin (Michelle/Dayna) in about 6 weeks  Referral to Hematology    Other Special Instructions:   Try to limit your sodium use as best you can.     If you need a refill on your cardiac medications before your next appointment, please call your pharmacy.   Call the [facility] office at [phone] if you have any questions, problems or concerns.

## 2015-09-27 NOTE — Progress Notes (Signed)
I agree with this plan.

## 2015-10-01 ENCOUNTER — Other Ambulatory Visit: Payer: Self-pay | Admitting: Internal Medicine

## 2015-10-10 ENCOUNTER — Ambulatory Visit: Payer: Medicare Other | Admitting: Internal Medicine

## 2015-10-10 ENCOUNTER — Ambulatory Visit (HOSPITAL_BASED_OUTPATIENT_CLINIC_OR_DEPARTMENT_OTHER): Payer: Medicare Other | Admitting: Hematology

## 2015-10-10 ENCOUNTER — Telehealth: Payer: Self-pay | Admitting: Hematology

## 2015-10-10 ENCOUNTER — Encounter: Payer: Self-pay | Admitting: Hematology

## 2015-10-10 ENCOUNTER — Ambulatory Visit (HOSPITAL_BASED_OUTPATIENT_CLINIC_OR_DEPARTMENT_OTHER): Payer: Medicare Other

## 2015-10-10 VITALS — BP 134/49 | HR 72 | Temp 97.9°F | Resp 18 | Ht 68.0 in | Wt 215.6 lb

## 2015-10-10 DIAGNOSIS — D508 Other iron deficiency anemias: Secondary | ICD-10-CM

## 2015-10-10 DIAGNOSIS — D890 Polyclonal hypergammaglobulinemia: Secondary | ICD-10-CM | POA: Diagnosis not present

## 2015-10-10 DIAGNOSIS — D649 Anemia, unspecified: Secondary | ICD-10-CM | POA: Diagnosis not present

## 2015-10-10 DIAGNOSIS — D509 Iron deficiency anemia, unspecified: Secondary | ICD-10-CM | POA: Diagnosis not present

## 2015-10-10 DIAGNOSIS — E538 Deficiency of other specified B group vitamins: Secondary | ICD-10-CM | POA: Diagnosis not present

## 2015-10-10 DIAGNOSIS — I10 Essential (primary) hypertension: Secondary | ICD-10-CM

## 2015-10-10 DIAGNOSIS — D500 Iron deficiency anemia secondary to blood loss (chronic): Secondary | ICD-10-CM

## 2015-10-10 DIAGNOSIS — I4891 Unspecified atrial fibrillation: Secondary | ICD-10-CM | POA: Diagnosis not present

## 2015-10-10 LAB — CBC & DIFF AND RETIC
BASO%: 0.9 % (ref 0.0–2.0)
Basophils Absolute: 0.1 10*3/uL (ref 0.0–0.1)
EOS%: 2 % (ref 0.0–7.0)
Eosinophils Absolute: 0.2 10*3/uL (ref 0.0–0.5)
HCT: 31.7 % — ABNORMAL LOW (ref 38.4–49.9)
HGB: 9.3 g/dL — ABNORMAL LOW (ref 13.0–17.1)
IMMATURE RETIC FRACT: 15.9 % — AB (ref 3.00–10.60)
LYMPH#: 0.8 10*3/uL — AB (ref 0.9–3.3)
LYMPH%: 8.6 % — ABNORMAL LOW (ref 14.0–49.0)
MCH: 21.8 pg — ABNORMAL LOW (ref 27.2–33.4)
MCHC: 29.2 g/dL — AB (ref 32.0–36.0)
MCV: 74.6 fL — ABNORMAL LOW (ref 79.3–98.0)
MONO#: 0.8 10*3/uL (ref 0.1–0.9)
MONO%: 9.3 % (ref 0.0–14.0)
NEUT#: 7.1 10*3/uL — ABNORMAL HIGH (ref 1.5–6.5)
NEUT%: 79.2 % — ABNORMAL HIGH (ref 39.0–75.0)
Platelets: 262 10*3/uL (ref 140–400)
RBC: 4.24 10*6/uL (ref 4.20–5.82)
RDW: 25.5 % — AB (ref 11.0–14.6)
RETIC CT ABS: 79.29 10*3/uL (ref 34.80–93.90)
Retic %: 1.87 % — ABNORMAL HIGH (ref 0.80–1.80)
WBC: 9 10*3/uL (ref 4.0–10.3)

## 2015-10-10 LAB — COMPREHENSIVE METABOLIC PANEL
ALT: 13 U/L (ref 0–55)
AST: 21 U/L (ref 5–34)
Albumin: 3.8 g/dL (ref 3.5–5.0)
Alkaline Phosphatase: 113 U/L (ref 40–150)
Anion Gap: 8 mEq/L (ref 3–11)
BILIRUBIN TOTAL: 0.94 mg/dL (ref 0.20–1.20)
BUN: 15.1 mg/dL (ref 7.0–26.0)
CHLORIDE: 104 meq/L (ref 98–109)
CO2: 28 meq/L (ref 22–29)
Calcium: 9.8 mg/dL (ref 8.4–10.4)
Creatinine: 0.9 mg/dL (ref 0.7–1.3)
EGFR: 80 mL/min/{1.73_m2} — AB (ref >90)
GLUCOSE: 106 mg/dL (ref 70–140)
Potassium: 4.2 mEq/L (ref 3.5–5.1)
Sodium: 139 mEq/L (ref 136–145)
TOTAL PROTEIN: 7.7 g/dL (ref 6.4–8.3)

## 2015-10-10 NOTE — Telephone Encounter (Signed)
Gave pt cal & avs °

## 2015-10-10 NOTE — Progress Notes (Signed)
[name] Kitchen    HEMATOLOGY/ONCOLOGY CONSULTATION NOTE  Date of Service: 10/10/2015  Patient Care Team: Biagio Borg, MD as PCP - General (Internal Medicine) Carlena Bjornstad, MD (Cardiology) Earnie Larsson, MD (Neurosurgery) Rutherford Guys, MD (Ophthalmology) Devra Dopp, MD as Referring Physician (Dermatology) Alexis Frock, MD as Consulting Physician (Urology)  CHIEF COMPLAINTS/PURPOSE OF CONSULTATION:   Microcytic Anemia  HISTORY OF PRESENTING ILLNESS:   Eric Rivers is a wonderful 82 y.o. male who has been referred to [place] by Dr.John, Hunt Oris, MD for evaluation and management of microcytic Anemia.  Patient is a wonderful retired Radio producer, Art gallery manager with multiple medical comorbidities hypertension, dyslipidemia, atrial fibrillation on Coumadin, diabetes, aortic stenosis, bladder cancer and history of prostate cancer (s/p RT in 2014, followed by Dr Tresa Moore - Radiation Oncology).  Patient has had a history of chronic anemia with labs showed a hemoglobin between 8.4-9 over the last 1 year with microcytic indices and elevated RDW. Patient notes fatigue. Denies any overt bleeding at this time. Previously had hematuria in March 2017 that was worked up by his urologist. Helene Kelp to be on Coumadin for atrial fibrillation. Has been on chronic PPI therapy could affect his iron absorption.   MEDICAL HISTORY:  Past Medical History:  Diagnosis Date  . ACE-inhibitor cough   . Aortic dissection [facility])    Surgical repair February, 2014  . Aortic stenosis    mild....echo... september... 2010/ mild... echo.[name] KitchenMarland KitchenDec, 2011  . Atrial fibrillation (HCC)     Chronic,   24 hour holter, September, 2011.... atrial fib rate is controlled.... there is some bradycardia but  no marked pauses  . Bladder cancer [facility])    recurrence with TUR-B March '09  . DDD (degenerative disc disease)    lumbar spine  . Diabetes mellitus    type 2  . Diabetes mellitus with neuropathy ([place]) 12/04/2006   Qualifier:  Diagnosis of  By: Linda Hedges MD, Heinz Knuckles  medications - DPP4   . Diverticulosis of colon with hemorrhage 01/01/2014  . Ejection fraction    EF 60%, echo, 01/2010  . Fluid overload   . Fracture of metacarpal bone 11/30/11  . GERD (gastroesophageal reflux disease)   . Gout   . History of echocardiogram    Echo 5/17 - mild LVH, EF 55-60%, mod AS (mean 16 mmHg, peak 29 mmHg), MAC, midl MR, massive BAE, mod dilated RV, low normal RVSF, mod TR, PASP 64 mmHg  . Hx of colonoscopy    approx. 10 years  . Hypercholesterolemia   . Hyperlipidemia   . Hypertension   . Mitral regurgitation   . Normal nuclear stress test    06/2005 , also ABI normal 2007  . Prostate cancer ([place])    radiation tx.  . Pulmonary hypertension ([place])    6mHg, echo, 01/2010  . Shortness of breath    on exertion  . Warfarin anticoagulation    Atrial fib  . Whooping cough     SURGICAL HISTORY: Past Surgical History:  Procedure Laterality Date  . ASCENDING AORTIC ROOT REPLACEMENT N/A 04/15/2012   Procedure: ASCENDING AORTIC ROOT REPLACEMENT;  Surgeon: BGaye Pollack MD;  [facility]  Service: Open Heart Surgery;  Laterality: N/A;  . bladder cancer biopsy  10/16/2010   negative for malignancy  . bladder microscopic  04/13/2007   high grade papillary urothelial lesions  . COLONOSCOPY N/A 01/01/2014   Procedure: COLONOSCOPY;  Surgeon: CGatha Mayer MD;  Location: [facility];  Service: Endoscopy;  Laterality: N/A;  .  CYSTOSCOPY/RETROGRADE/URETEROSCOPY Bilateral 04/04/2013   Procedure: CYSTOSCOPY WITH RETROGRADE PYELOGRAM AND BLADDER BIOPSY;  Surgeon: Molli Hazard, MD;  Location: [facility];  Service: Urology;  Laterality: Bilateral;  . 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 EXPLORATION POST OPERATIVE OPEN HEART  04/2012  . Herniatic disc surgery    . LUMBAR LAMINECTOMY     '02  . PROSTATE BIOPSY  09/25/2011   GLEASON 3+3=6 AND 4+3=7  . TEE WITHOUT CARDIOVERSION N/A  04/15/2012   Procedure: TRANSESOPHAGEAL ECHOCARDIOGRAM (TEE);  Surgeon: Gaye Pollack, MD;  Location: [facility];  Service: Open Heart Surgery;  Laterality: N/A;  . TRANSURETHRAL RESECTION OF BLADDER TUMOR N/A 06/13/2015   Procedure: TRANSURETHRAL RESECTION OF PROSTATE (TURP) CLOT EVACUATION AND FULGERATION, BLADDER STONE REMOVAL ;  Surgeon: Alexis Frock, MD;  Location[facility];  Service: Urology;  Laterality: N/A;  . TUR-BT '06, '09      SOCIAL HISTORY: Social History   Social History  . Marital status: Single    Spouse name: N/A  . Number of children: 0  . Years of education: 26   Occupational History  . history and Advice worker Retired    retired   Social History Main Topics  . Smoking status: Former Smoker    Packs/day: 1.00    Types: Cigarettes    Quit date: 03/04/1975  . Smokeless tobacco: Never Used  . Alcohol use 7.0 oz/week    14 drink(s) per week     Comment: liquor daily  . Drug use: No  . Sexual activity: Not Currently   Other Topics Concern  . Not on file   Social History Narrative   Confirmed batchelor. Retired Education officer, museum. World traveler- Ambulance person. I- ADLS. End of life Care   No CPR, no prolonged intubation, no prolonged artificial hydration or feeding, no heroic or futile measures.       [place] Sept [place]13 [place]      Last cruise - two cruises back to back to the [place] in January '15. May'15: [place] with stops in [place], [place], [place], [place], [place]. 15 days      Fall '15: 50 Days from [place]. [place], [place], [place], [place], [place], [place], [place], [place], [place].       May '15 - 15 days up the [employer] as far as [place]             FAMILY HISTORY: Family History  Problem Relation Age of Onset  . Prostate cancer Father 21    passed with prostate ca  . Cancer Father   . Stroke Father   . Heart disease Mother   . Hypertension Mother    . Heart attack Mother     ALLERGIES:  is allergic to metoprolol.  MEDICATIONS:  Current Outpatient Prescriptions  Medication Sig Dispense Refill  . acetaminophen (TYLENOL) 500 MG tablet Take 1,000 mg by mouth 3 (three) times daily as needed for mild pain, moderate pain, fever or headache.     . allopurinol (ZYLOPRIM) 300 MG tablet Take 1 tablet (300 mg total) by mouth daily. 90 tablet 1  . amLODipine (NORVASC) 2.5 MG tablet Take 1 tablet (2.5 mg total) by mouth daily. 90 tablet 3  . aspirin EC 325 MG tablet Take 325 mg by mouth daily.    [name] Kitchen atorvastatin (LIPITOR) 20 MG tablet Take 1 tablet (20 mg total) by  mouth daily. 30 tablet 3  . Calcium Carb-Cholecalciferol (CALCIUM 600 + D PO) Take 1 tablet by mouth at bedtime.     [name] Kitchen desonide (DESOWEN) 0.05 % lotion Apply 1 application topically daily as needed (rosacea).     . Ferrous Sulfate (IRON) 325 (65 Fe) MG TABS Take 325 mg by mouth daily. 30 each 5  . finasteride (PROSCAR) 5 MG tablet Take 1 tablet (5 mg total) by mouth every morning. 90 tablet 3  . furosemide (LASIX) 40 MG tablet Take 1 tablet (40 mg total) by mouth daily. 30 tablet   . JANUVIA 100 MG tablet take 1 tablet by mouth once daily 90 tablet 2  . ketoconazole (NIZORAL) 2 % shampoo Apply 1 application topically 4 (four) times a week.    . mirabegron ER (MYRBETRIQ) 50 MG TB24 tablet Take 50 mg by mouth daily.    . Multiple Vitamins-Minerals (ONCOVITE PO) Take 1 tablet by mouth daily.     . pantoprazole (PROTONIX) 40 MG tablet Take 1 tablet (40 mg total) by mouth daily. 90 tablet 1  . potassium chloride (K-DUR,KLOR-CON) 10 MEQ tablet Take 1 tablet (10 mEq total) by mouth daily. 90 tablet 3  . senna (SENOKOT) 8.6 MG tablet Take 1 tablet by mouth at bedtime.     . sitaGLIPtin (JANUVIA) 100 MG tablet Take 100 mg by mouth daily.    . Tamsulosin HCl (FLOMAX) 0.4 MG CAPS Take 0.4 mg by mouth at bedtime.     . traZODone (DESYREL) 50 MG tablet Take 1 tablet (50 mg total) by mouth as  directed. 30 tablet 3  . warfarin (COUMADIN) 4 MG tablet Take 4 mg by mouth daily. Except thrusday (4m )  0   No current facility-administered medications for this visit.     REVIEW OF SYSTEMS:    10 Point review of Systems was done is negative except as noted above.  PHYSICAL EXAMINATION: ECOG PERFORMANCE STATUS: 2 - Symptomatic, <50% confined to bed  . Vitals:   10/10/15 1402  BP: (!) 134/49  Pulse: 72  Resp: 18  Temp: 97.9 F (36.6 C)   Filed Weights   10/10/15 1402  Weight: 215 lb 9.6 oz (97.8 kg)   .Body mass index is 32.78 kg/m.  GENERAL:alert, in no acute distress and comfortable SKIN: no acute rashes EYES: normal, conjunctiva are pink and non-injected, sclera clear OROPHARYNX:mmm NECK: supple, mild JVD LYMPH:  no palpable lymphadenopathy in the cervical, axillary or inguinal LUNGS: minimal basal rales no rhonchi HEART: rirreg, b/l 1+ pedal edema ABDOMEN: abdomen soft, non-tender, normoactive bowel sounds  Musculoskeletal: no cyanosis of digits and no clubbing  PSYCH: alert & oriented x 3 with fluent speech NEURO: no focal motor/sensory deficits  LABORATORY DATA:  I have reviewed the data as listed    Component     Latest Ref Rng & Units 10/10/2015  Sodium     136 - 145 mEq/L 139  Potassium     3.5 - 5.1 mEq/L 4.2  Chloride     98 - 109 mEq/L 104  CO2     22 - 29 mEq/L 28  Glucose     70 - 140 mg/dl 106  BUN     7.0 - 26.0 mg/dL 15.1  Creatinine     0.7 - 1.3 mg/dL 0.9  Total Bilirubin     0.20 - 1.20 mg/dL 0.94  Alkaline Phosphatase     40 - 150 U/L 113  AST     5 -  34 U/L 21  ALT     0 - 55 U/L 13  Total Protein     6.4 - 8.3 g/dL 7.7  Albumin     3.5 - 5.0 g/dL 3.8  Calcium     8.4 - 10.4 mg/dL 9.8  Anion gap     3 - 11 mEq/L 8  EGFR     >90 ml/min/1.73 m2 80 (L)  Iron     42 - 163 ug/dL 151  TIBC     202 - 409 ug/dL 451 (H)  UIBC     117 - 376 ug/dL 300  %SAT     20 - 55 % 33  Ferritin     22 - 316 ng/ml 30  Vitamin  B12     211 - 946 pg/mL 681        RADIOGRAPHIC STUDIES: I have personally reviewed the radiological images as listed and agreed with the findings in the report. [place] Extrem Low Right Ltd  Result Date: 09/24/2015 MSK US performed of: right This study was ordered, performed, and interpreted by Charlann Boxer D.O.  Knee: All structures visualized. Degenerative arthritic changes of the knee medially and laterally moderate to severe in nature Patellar Tendon unremarkable on long and transverse views without effusion. No abnormality of prepatellar bursa. LCL and MCL unremarkable on long and transverse views. No abnormality of origin of medial or lateral head of the gastrocnemius.    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 2) Iron deficiency likely related to chronic blood loss Patient on coumadin for afib -- several sources of possible bleeding -hematurea, GI. Has been on PPI which might affect iron absorption too.  His hgb has improved some from 8.4 to 9.3. Ferritin is 30 with 33% iron saturation. Plan -patient denies any active/overt bleeding at this time. -We discussed options of increasing oral iron replacement to ferrous sulfate 1 tablet by mouth twice a day with close monitoring. Alternatively we could treat her more aggressively with IV iron replacement. -Patient notes that he would like to try to increase oral iron first with close follow-up and will consider IV iron if his anemia does not respond adequately. This is fairly reasonable  3) low risk IgG lambda MGUS with polyclonal IgA. Patient has M spike of only 0.2 g/dL. Plan -Repeat SPEP in 6 months monitor on labs at this time. -no indication for BM Bx or other extensive w/u at this time in the context of the patients overall health.  Labs today and in 2  months F/u with Dr Irene Limbo in 2 months  All of the patients questions were answered with apparent satisfaction. The patient knows to call the clinic with any problems, questions or concerns.  I spent 45 minutes counseling the patient face to face. The total time spent in the appointment was 60 minutes and more than 50% was on counseling and direct patient cares.    Sullivan Lone MD [place] AAHIVMS [facility] Hematology/Oncology Physician [facility]  (Office):       [phone] (Work cell):  [phone] (Fax):           [phone]  10/10/2015 2:17 PM

## 2015-10-11 LAB — MULTIPLE MYELOMA PANEL, SERUM
ALBUMIN SERPL ELPH-MCNC: 3.8 g/dL (ref 2.9–4.4)
ALPHA 1: 0.2 g/dL (ref 0.0–0.4)
Albumin/Glob SerPl: 1.1 (ref 0.7–1.7)
Alpha2 Glob SerPl Elph-Mcnc: 0.8 g/dL (ref 0.4–1.0)
B-GLOBULIN SERPL ELPH-MCNC: 1.3 g/dL (ref 0.7–1.3)
Gamma Glob SerPl Elph-Mcnc: 1.2 g/dL (ref 0.4–1.8)
Globulin, Total: 3.5 g/dL (ref 2.2–3.9)
IGA/IMMUNOGLOBULIN A, SERUM: 444 mg/dL — AB (ref 61–437)
IgG, Qn, Serum: 1128 mg/dL (ref 700–1600)
IgM, Qn, Serum: 75 mg/dL (ref 15–143)
M PROTEIN SERPL ELPH-MCNC: 0.2 g/dL — AB
TOTAL PROTEIN: 7.3 g/dL (ref 6.0–8.5)

## 2015-10-11 LAB — KAPPA/LAMBDA LIGHT CHAINS
Ig Kappa Free Light Chain: 77.2 mg/L — ABNORMAL HIGH (ref 3.3–19.4)
Ig Lambda Free Light Chain: 30.5 mg/L — ABNORMAL HIGH (ref 5.7–26.3)
KAPPA/LAMBDA FLC RATIO: 2.53 — AB (ref 0.26–1.65)

## 2015-10-11 LAB — FERRITIN: FERRITIN: 30 ng/mL (ref 22–316)

## 2015-10-11 LAB — IRON AND TIBC
%SAT: 33 % (ref 20–55)
Iron: 151 ug/dL (ref 42–163)
TIBC: 451 ug/dL — ABNORMAL HIGH (ref 202–409)
UIBC: 300 ug/dL (ref 117–376)

## 2015-10-11 LAB — VITAMIN B12: VITAMIN B 12: 681 pg/mL (ref 211–946)

## 2015-10-15 ENCOUNTER — Telehealth: Payer: Self-pay | Admitting: *Deleted

## 2015-10-15 MED ORDER — IRON 325 (65 FE) MG PO TABS: 325.0000 mg | ORAL_TABLET | Freq: Two times a day (BID) | ORAL | 3 refills | Status: DC

## 2015-10-15 NOTE — Telephone Encounter (Signed)
Patient called and stated that Dr Irene Limbo checked his iron levels and instructed him to double his dose of ferrous sulfate based on the results. He stated that he was not sure if he needed to call Dr Irene Limbo or Dr Meda Coffee. He stated that Dr Meda Coffee recently started him on the medication last month. He would also like the rx changed to a ninety day supply. He can be reached at [phone]. Thanks, MI

## 2015-10-15 NOTE — Telephone Encounter (Signed)
Will send this message to Dr Meda Coffee to review and advise on, then will refill this med accordingly based on her recommendations.

## 2015-10-15 NOTE — Telephone Encounter (Signed)
I agree with that, we can send the prescription and if he needs to.

## 2015-10-15 NOTE — Telephone Encounter (Signed)
Notified the pt that per Dr Meda Coffee, she agrees with Dr Irene Limbo that he should increase his ferrous sulfate to 325 mg po bid.  Per the pt, Dr Irene Limbo only recommended this, and this new prescription would need to come from Dr Meda Coffee, being she is the original ordering MD. Confirmed with Dr Meda Coffee that the pt should be taking ferrous sulfate 325 mg po bid.  Per Dr Meda Coffee this pt should increase this to 325 mg po bid.  Confirmed the pharmacy of choice with the pt.  Pt request for a 90 day supply of this med to be sent to his pharmacy. Pt verbalized understanding, agrees with this plan, and gracious for all the assistance provided.

## 2015-10-19 ENCOUNTER — Ambulatory Visit (INDEPENDENT_AMBULATORY_CARE_PROVIDER_SITE_OTHER): Payer: Medicare Other | Admitting: Family Medicine

## 2015-10-19 ENCOUNTER — Encounter: Payer: Self-pay | Admitting: Family Medicine

## 2015-10-19 ENCOUNTER — Ambulatory Visit (INDEPENDENT_AMBULATORY_CARE_PROVIDER_SITE_OTHER): Payer: Medicare Other

## 2015-10-19 VITALS — BP 138/76 | HR 92 | Temp 98.0°F | Resp 16 | Ht 68.0 in | Wt 218.0 lb

## 2015-10-19 DIAGNOSIS — S92511A Displaced fracture of proximal phalanx of right lesser toe(s), initial encounter for closed fracture: Secondary | ICD-10-CM | POA: Diagnosis not present

## 2015-10-19 DIAGNOSIS — S99921A Unspecified injury of right foot, initial encounter: Secondary | ICD-10-CM

## 2015-10-19 DIAGNOSIS — S92912B Unspecified fracture of left toe(s), initial encounter for open fracture: Secondary | ICD-10-CM | POA: Diagnosis not present

## 2015-10-19 NOTE — Patient Instructions (Signed)
Orthopedics will contact you to schedule appointment!  Nice meeting you !  Carroll Sage. Kenton Kingfisher, MSN, FNP-C [facility]    IF you received an x-ray today, you will receive an invoice from [facility]. Please contact [facility] at [phone] with questions or concerns regarding your invoice.   IF you received labwork today, you will receive an invoice from [employer]. Please contact [facility] at [phone] with questions or concerns regarding your invoice.   Our billing staff will not be able to assist you with questions regarding bills from these companies.  You will be contacted with the lab results as soon as they are available. The fastest way to get your results is to activate your My Chart account. Instructions are located on the last page of this paperwork. If you have not heard from [place] regarding the results in 2 weeks, please contact this office.

## 2015-10-19 NOTE — Progress Notes (Signed)
Patient ID: Eric Rivers, male    DOB: 12-24-1930, 82 y.o.   MRN: [id number]  PCP: Cathlean Cower, MD  Chief Complaint  Patient presents with  . Toe Pain    Right foot, 5th toe    Subjective:   HPI Presents for evaluation of 5th toe injury times one day.  Patient reports suffering from chronic urinary frequency and was walking barefooted to the bathroom and banged his 5th right toe on the door.  He denies pain but reports a discomforting sensation. The patient is able to ambulate without discomfort. He reports noticing bruising around the lower base of the  5th digit several hours after injury.  . Social History   Social History  . Marital status: Single    Spouse name: N/A  . Number of children: 0  . Years of education: 24   Occupational History  . history and Advice worker Retired    retired   Social History Main Topics  . Smoking status: Former Smoker    Packs/day: 1.00    Types: Cigarettes    Quit date: 03/04/1975  . Smokeless tobacco: Never Used  . Alcohol use 7.0 oz/week    14 drink(s) per week     Comment: liquor daily  . Drug use: No  . Sexual activity: Not Currently   Other Topics Concern  . Not on file   Social History Narrative   Confirmed batchelor. Retired Education officer, museum. World traveler- Ambulance person. I- ADLS. End of life Care   No CPR, no prolonged intubation, no prolonged artificial hydration or feeding, no heroic or futile measures.       Next trip Sept [place]13 [place]      Last cruise - two cruises back to back to the [place] in January '15. May'15: [place] to [place] with stops in [place], [place], [place], [place], [place]. 15 days      Fall '15: 50 Days from [place]. [place], [place], [place], [place], [place], [place], [place], [place], [place].       May '15 - 15 days up the [employer] as far as [place]              Family History  Problem Relation Age of  Onset  . Prostate cancer Father 76    passed with prostate ca  . Cancer Father   . Stroke Father   . Heart disease Mother   . Hypertension Mother   . Heart attack Mother     Review of Systems  Musculoskeletal:       See HPI    Patient Active Problem List   Diagnosis Date Noted  . Degenerative arthritis of right knee 09/20/2015  . Pronation deformity of both feet 09/20/2015  . Acute on chronic diastolic heart failure ([place]) 09/19/2015  . Dyspnea 09/07/2015  . Peripheral edema 09/07/2015  . Right knee pain 09/07/2015  . Hematuria 06/13/2015  . AKI (acute kidney injury) ([place]) 06/02/2015  . Elevated INR 06/01/2015  . Anemia 06/01/2015  . Hemorrhagic cystitis 06/01/2015  . Cough 04/20/2014  . Bradycardia 01/06/2014  . Diverticulosis of colon with hemorrhage 01/01/2014  . Perianal dermatitis 01/01/2014  . Chronic anemia 12/29/2013  . Lower GI bleed 12/29/2013  . Warfarin-induced coagulopathy ([place]) 12/29/2013  . Acute blood loss anemia 12/29/2013  . Chronic diastolic CHF (congestive heart failure) ([place]) 11/02/2013  . Syncope 07/19/2013  . Advanced care planning/counseling discussion 04/12/2013  .  Encounter for therapeutic drug monitoring 04/12/2013  . Insomnia 04/11/2013  . Mitral regurgitation   . Ascending aortic dissection ([place]) 04/16/2012  . Malignant neoplasm of prostate ([place]) 10/27/2011  . Warfarin anticoagulation   . Hypertension   . GERD (gastroesophageal reflux disease)   . Aortic stenosis   . Pulmonary hypertension ([place])   . Hyperlipidemia   . Ejection fraction   . Normal nuclear stress test   . Routine general medical examination at a health care facility 08/22/2010  . Bladder cancer ([place])   . Gout   . Chronic atrial fibrillation ([place])   . HYPERTROPHY PROSTATE W/O UR OBST & OTH LUTS 09/07/2007  . DEGENERATIVE JOINT DISEASE, GENERALIZED 09/07/2007  . NEOP, MALIGNANT, BLADDER NEC 12/04/2006  . Diabetes mellitus with neuropathy ([place]) 12/04/2006     Prior  to Admission medications   Medication Sig Start Date End Date Taking? Authorizing Provider  acetaminophen (TYLENOL) 500 MG tablet Take 1,000 mg by mouth 3 (three) times daily as needed for mild pain, moderate pain, fever or headache.    Yes Historical Provider, MD  allopurinol (ZYLOPRIM) 300 MG tablet Take 1 tablet (300 mg total) by mouth daily. 07/12/15  Yes Biagio Borg, MD  amLODipine (NORVASC) 2.5 MG tablet Take 1 tablet (2.5 mg total) by mouth daily. 07/11/15  Yes Dorothy Spark, MD  aspirin EC 325 MG tablet Take 325 mg by mouth daily.   Yes Historical Provider, MD  atorvastatin (LIPITOR) 20 MG tablet Take 1 tablet (20 mg total) by mouth daily. 07/10/15  Yes Biagio Borg, MD  Calcium Carb-Cholecalciferol (CALCIUM 600 + D PO) Take 1 tablet by mouth at bedtime.    Yes Historical Provider, MD  desonide (DESOWEN) 0.05 % lotion Apply 1 application topically daily as needed (rosacea).    Yes Historical Provider, MD  Ferrous Sulfate (IRON) 325 (65 Fe) MG TABS Take 325 mg by mouth 2 (two) times daily. 10/15/15  Yes Dorothy Spark, MD  finasteride (PROSCAR) 5 MG tablet Take 1 tablet (5 mg total) by mouth every morning. 12/23/13  Yes Biagio Borg, MD  furosemide (LASIX) 40 MG tablet Take 1 tablet (40 mg total) by mouth daily. 09/07/15  Yes Biagio Borg, MD  JANUVIA 100 MG tablet take 1 tablet by mouth once daily 10/01/15  Yes Biagio Borg, MD  ketoconazole (NIZORAL) 2 % shampoo Apply 1 application topically 4 (four) times a week.   Yes Historical Provider, MD  mirabegron ER (MYRBETRIQ) 50 MG TB24 tablet Take 50 mg by mouth daily.   Yes Historical Provider, MD  Multiple Vitamins-Minerals (ONCOVITE PO) Take 1 tablet by mouth daily.    Yes Historical Provider, MD  pantoprazole (PROTONIX) 40 MG tablet Take 1 tablet (40 mg total) by mouth daily. 07/12/15  Yes Biagio Borg, MD  potassium chloride (K-DUR,KLOR-CON) 10 MEQ tablet Take 1 tablet (10 mEq total) by mouth daily. 07/11/15  Yes Dorothy Spark, MD  senna  (SENOKOT) 8.6 MG tablet Take 1 tablet by mouth at bedtime.    Yes Historical Provider, MD  Tamsulosin HCl (FLOMAX) 0.4 MG CAPS Take 0.4 mg by mouth at bedtime.    Yes Historical Provider, MD  traZODone (DESYREL) 50 MG tablet Take 1 tablet (50 mg total) by mouth as directed. 08/29/15  Yes Biagio Borg, MD  warfarin (COUMADIN) 4 MG tablet Take 4 mg by mouth daily. Except thrusday (6mg  ) 09/24/15  Yes Historical Provider, MD     Allergies  Allergen Reactions  . Metoprolol Other (See Comments)    Marked bradycardia at 25 mg bid       Objective:  Physical Exam  Constitutional: He is oriented to person, place, and time. He appears well-developed and well-nourished.  HENT:  Head: Normocephalic and atraumatic.  Right Ear: External ear normal.  Left Ear: External ear normal.  Nose: Nose normal.  Mouth/Throat: Oropharynx is clear and moist.  Eyes: Pupils are equal, round, and reactive to light.  Neck: Normal range of motion. Neck supple.  Cardiovascular: Normal rate, regular rhythm, normal heart sounds and intact distal pulses.   Bilateral lower nonpitting lower extremity swelling  Pulmonary/Chest: Effort normal and breath sounds normal.  Musculoskeletal: He exhibits edema and tenderness.  Bruising at base of right 5th toe. Toes warm, pulse palpable.   Neurological: He is alert and oriented to person, place, and time.  Skin: Skin is warm and dry.     .[place] Extrem Low Right Ltd  Result Date: 09/24/2015 MSK US performed of: right This study was ordered, performed, and interpreted by Charlann Boxer D.O.  Knee: All structures visualized. Degenerative arthritic changes of the knee medially and laterally moderate to severe in nature Patellar Tendon unremarkable on long and transverse views without effusion. No abnormality of prepatellar bursa. LCL and MCL unremarkable on long and transverse views. No abnormality of origin of medial or lateral head of the gastrocnemius.    Arthritic changes of the  knee with overlying soft tissue swelling   Dg Toe 5th Right  Result Date: 10/19/2015 CLINICAL DATA:  Injury.  Initial evaluation. EXAM: RIGHT FIFTH TOE COMPARISON:  No recent prior. FINDINGS: Fracture of the distal aspect of the right fifth proximal phalanx. Fractures posteriorly displaced. Diffuse osteopenia degenerative change. IMPRESSION: Displaced fracture of the distal aspect of the proximal phalanx of the right fifth digit . Electronically Signed   By: Marcello Moores  Register   On: 10/19/2015 12:17   Assessment & Plan:  1. Injury of right toe, initial encounter 2. Toe fracture, left, open, initial encounter - DG Toe 5th Right; Displaced fracture of the distal aspect of the proximal phalanx of the right fifth digit  - AMB referral to orthopedics - Buddy taped 4th and 5th digit and placed right foot in post-op shoe  Carroll Sage. Kenton Kingfisher, MSN, FNP-C [facility]

## 2015-10-22 NOTE — Progress Notes (Signed)
[facility] [address], [place] [address] Phone: [phone] Subjective:    I'm seeing this patient by the request  of:  Eric Cower, MD  CC: Right knee pain Follow-up  [id number]  Eric Rivers is a 82 y.o. male coming in with complaint of right knee pain. . Did have x-rays. Independently visualized by me. Patient does have mild to moderate tricompartmental osteoarthritic changes as well as CPPD.  Patient was seen previously and was to do conservative therapy. Patient states that he is doing well. Not having any significant pain. Patient states some mild discomfort from time to time but no locking or giving out on him.   patient also recently did have a toe fracture. Was seen in urgent care. Patient did have x-rays at that time that were independently visualized by me. Distal aspect of the right fifth proximal phalanx does have a posterior dislocation fracture. Patient is seen a foot specialist and wants to continue to see them.  Past Medical History:  Diagnosis Date  . ACE-inhibitor cough   . Aortic dissection [facility])    Surgical repair February, 2014  . Aortic stenosis    mild....echo... september... 2010/ mild... echo.[name] KitchenMarland KitchenDec, 2011  . Atrial fibrillation (HCC)     Chronic,   24 hour holter, September, 2011.... atrial fib rate is controlled.... there is some bradycardia but  no marked pauses  . Bladder cancer [facility])    recurrence with TUR-B March '09  . DDD (degenerative disc disease)    lumbar spine  . Diabetes mellitus    type 2  . Diabetes mellitus with neuropathy ([place]) 12/04/2006   Qualifier: Diagnosis of  By: Linda Hedges MD, Heinz Knuckles  medications - DPP4   . Diverticulosis of colon with hemorrhage 01/01/2014  . Ejection fraction    EF 60%, echo, 01/2010  . Fluid overload   . Fracture of metacarpal bone 11/30/11  . GERD (gastroesophageal reflux disease)   . Gout   . History of echocardiogram    Echo 5/17 - mild LVH, EF 55-60%, mod AS  (mean 16 mmHg, peak 29 mmHg), MAC, midl MR, massive BAE, mod dilated RV, low normal RVSF, mod TR, PASP 64 mmHg  . Hx of colonoscopy    approx. 10 years  . Hypercholesterolemia   . Hyperlipidemia   . Hypertension   . Mitral regurgitation   . Normal nuclear stress test    06/2005 , also ABI normal 2007  . Prostate cancer ([place])    radiation tx.  . Pulmonary hypertension ([place])    16mmHg, echo, 01/2010  . Shortness of breath    on exertion  . Warfarin anticoagulation    Atrial fib  . Whooping cough    Past Surgical History:  Procedure Laterality Date  . ASCENDING AORTIC ROOT REPLACEMENT N/A 04/15/2012   Procedure: ASCENDING AORTIC ROOT REPLACEMENT;  Surgeon: Gaye Pollack, MD;  Location: [place];  Service: Open Heart Surgery;  Laterality: N/A;  . bladder cancer biopsy  10/16/2010   negative for malignancy  . bladder microscopic  04/13/2007   high grade papillary urothelial lesions  . COLONOSCOPY N/A 01/01/2014   Procedure: COLONOSCOPY;  Surgeon: Gatha Mayer, MD;  [facility];  Service: Endoscopy;  Laterality: N/A;  . CYSTOSCOPY/RETROGRADE/URETEROSCOPY Bilateral 04/04/2013   Procedure: CYSTOSCOPY WITH RETROGRADE PYELOGRAM AND BLADDER BIOPSY;  Surgeon: Molli Hazard, MD;  [facility];  Service: Urology;  Laterality: Bilateral;  . 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 EXPLORATION POST OPERATIVE OPEN HEART  04/2012  . Herniatic disc surgery    . LUMBAR LAMINECTOMY     '02  . PROSTATE BIOPSY  09/25/2011   GLEASON 3+3=6 AND 4+3=7  . TEE WITHOUT CARDIOVERSION N/A 04/15/2012   Procedure: TRANSESOPHAGEAL ECHOCARDIOGRAM (TEE);  Surgeon: Gaye Pollack, MD;  Location: [place] [facility];  Service: Open Heart Surgery;  Laterality: N/A;  . TRANSURETHRAL RESECTION OF BLADDER TUMOR N/A 06/13/2015   Procedure: TRANSURETHRAL RESECTION OF PROSTATE (TURP) CLOT EVACUATION AND FULGERATION, BLADDER STONE REMOVAL ;  Surgeon: Alexis Frock, MD;  [facility];   Service: Urology;  Laterality: N/A;  . TUR-BT '06, '09     Social History   Social History  . Marital status: Single    Spouse name: N/A  . Number of children: 0  . Years of education: 56   Occupational History  . history and Advice worker Retired    retired   Social History Main Topics  . Smoking status: Former Smoker    Packs/day: 1.00    Types: Cigarettes    Quit date: 03/04/1975  . Smokeless tobacco: Never Used  . Alcohol use 7.0 oz/week    14 drink(s) per week     Comment: liquor daily  . Drug use: No  . Sexual activity: Not Currently   Other Topics Concern  . None   Social History Narrative   Confirmed batchelor. Retired Education officer, museum. World traveler- Ambulance person. I- ADLS. End of life Care   No CPR, no prolonged intubation, no prolonged artificial hydration or feeding, no heroic or futile measures.       [place]13 [place]      Last cruise - two cruises back to back to the [place] in January '15. [place]'15: [place] with stops in [place], [place] 15 days      Fall '15: 50 Days from [place]. [place], [place], [place], [place], [place], [place], [place], [place], [place].       May '15 - 15 days up the [employer] as far as [place]            Allergies  Allergen Reactions  . Metoprolol Other (See Comments)    Marked bradycardia at 25 mg bid   Family History  Problem Relation Age of Onset  . Prostate cancer Father 17    passed with prostate ca  . Cancer Father   . Stroke Father   . Heart disease Mother   . Hypertension Mother   . Heart attack Mother     Past medical history, social, surgical and family history all reviewed in electronic medical record.  No pertanent information unless stated regarding to the chief complaint.   Review of Systems: No headache, visual changes, nausea, vomiting, diarrhea, constipation, dizziness,  abdominal pain, skin rash, fevers, chills, night sweats, weight loss, swollen lymph nodes,, chest pain, mood changes.   Objective  Blood pressure 118/72, pulse 72, weight 219 lb (99.3 kg), SpO2 98 %.  General: No apparent distress alert and oriented x3 mood and affect normal, dressed appropriately. obese HEENT: Pupils equal, extraocular movements intact  Respiratory: Patient's speak in full sentences and does appear short of breath  Cardiovascular: 3+lower extremity edema,  tender, no erythema significant hemosiderin deposits. Skin: Warm dry intact with no signs of infection or rash on extremities or on axial skeleton.  Abdomen:  Soft nontender  Neuro: Cranial nerves II through XII are intact, neurovascularly intact in all extremities with 2+ DTRs and 2+ pulses.  Lymph: No lymphadenopathy of posterior or anterior cervical chain or axillae bilaterally.  Gait antalgic gait using a walker. MSK:  Non tender with full range of motion and good stability and symmetric strength and tone of shoulders, elbows, wrist, hip, and ankles bilaterally.  Knee:right Difficult to assess secondary to patient's body habitus severe 3+ pitting edema of the lower extremities to mid thighs significant amount of hemosiderin deposits of the lower legs bilaterally Mild tenderness to palpation over the medial joint line Lacks last 10 of flexion. mildinstability with valgus force Patellar and quadriceps tendons unremarkable. 4 out of 5 hamstring and quadriceps strength but symmetric to the contralateral side Right foot is in a postop boot     Impression and Recommendations:     This case required medical decision making of moderate complexity.      Note: This dictation was prepared with Dragon dictation along with smaller phrase technology. Any transcriptional errors that result from this process are unintentional.

## 2015-10-23 ENCOUNTER — Ambulatory Visit (INDEPENDENT_AMBULATORY_CARE_PROVIDER_SITE_OTHER): Payer: Medicare Other | Admitting: Family Medicine

## 2015-10-23 ENCOUNTER — Ambulatory Visit (INDEPENDENT_AMBULATORY_CARE_PROVIDER_SITE_OTHER): Payer: Medicare Other | Admitting: General Practice

## 2015-10-23 ENCOUNTER — Encounter: Payer: Self-pay | Admitting: Family Medicine

## 2015-10-23 ENCOUNTER — Ambulatory Visit: Payer: Medicare Other

## 2015-10-23 DIAGNOSIS — M1711 Unilateral primary osteoarthritis, right knee: Secondary | ICD-10-CM

## 2015-10-23 DIAGNOSIS — I272 Pulmonary hypertension, unspecified: Secondary | ICD-10-CM

## 2015-10-23 DIAGNOSIS — Z7901 Long term (current) use of anticoagulants: Secondary | ICD-10-CM

## 2015-10-23 DIAGNOSIS — Z5181 Encounter for therapeutic drug level monitoring: Secondary | ICD-10-CM

## 2015-10-23 DIAGNOSIS — I482 Chronic atrial fibrillation, unspecified: Secondary | ICD-10-CM

## 2015-10-23 LAB — POCT INR: INR: 2

## 2015-10-23 NOTE — Progress Notes (Signed)
I have reviewed and agree with the plan. 

## 2015-10-23 NOTE — Assessment & Plan Note (Signed)
Discussed with patient at great length. Patient feels he is doing well. We discussed with him of any worsening symptoms we will consider injections, we discussed different types of injections as well as topical anti-inflammatories. We discussed icing regimen. Patient will call if he has any worsening pain otherwise see me as needed.

## 2015-10-23 NOTE — Patient Instructions (Signed)
Good to see you  Follow up with the other physician on the foot.  For the knee we will continue to watch it.   We would start with a steroid injection if you need it and just call.  If that fails we can discuss other injections and custom bracing For the foot you will need that boot for multiple weeks. pennsaid pinkie amount topically 2 times daily as needed.  Ice is better then heat when hurting See me again when you need me.

## 2015-10-24 ENCOUNTER — Ambulatory Visit: Payer: Medicare Other

## 2015-10-24 DIAGNOSIS — M7989 Other specified soft tissue disorders: Secondary | ICD-10-CM | POA: Diagnosis not present

## 2015-10-24 DIAGNOSIS — S92511A Displaced fracture of proximal phalanx of right lesser toe(s), initial encounter for closed fracture: Secondary | ICD-10-CM | POA: Diagnosis not present

## 2015-10-24 DIAGNOSIS — M79674 Pain in right toe(s): Secondary | ICD-10-CM | POA: Diagnosis not present

## 2015-10-25 DIAGNOSIS — I739 Peripheral vascular disease, unspecified: Secondary | ICD-10-CM | POA: Diagnosis not present

## 2015-10-25 DIAGNOSIS — L603 Nail dystrophy: Secondary | ICD-10-CM | POA: Diagnosis not present

## 2015-10-29 ENCOUNTER — Other Ambulatory Visit: Payer: Self-pay | Admitting: Internal Medicine

## 2015-10-30 ENCOUNTER — Other Ambulatory Visit: Payer: Self-pay | Admitting: Surgery

## 2015-10-30 DIAGNOSIS — I7101 Dissection of ascending aorta: Secondary | ICD-10-CM

## 2015-11-06 ENCOUNTER — Telehealth: Payer: Self-pay | Admitting: Cardiology

## 2015-11-06 NOTE — Telephone Encounter (Signed)
Returned phone call to Dr. Alysia Penna office to clarify dental work. States pt is just having 1 tooth extracted. Per protocol, do not hold Coumadin for a single dental extraction. Pt's stroke risk is higher than bleed risk - CHADS2 score of 4 (HTN, age > 53, HF, and DM). Will route clearance to dental office with preference for pt to remain on Coumadin for single extraction.

## 2015-11-06 NOTE — Telephone Encounter (Signed)
New message        What dental office are you calling from? Dr Orvil Feil 1. What is your office phone and fax number?  [phone]  What type of procedure is the patient having performed?  Tooth extracted (not in sinus cavity) What date is procedure scheduled? Pending clearance What is your question (ex. Antibiotics prior to procedure, holding medication-we need to know how long dentist wants pt to hold med)? Hold coumadin

## 2015-11-12 ENCOUNTER — Other Ambulatory Visit: Payer: Self-pay | Admitting: Internal Medicine

## 2015-11-12 ENCOUNTER — Encounter: Payer: Self-pay | Admitting: Physician Assistant

## 2015-11-12 ENCOUNTER — Ambulatory Visit (INDEPENDENT_AMBULATORY_CARE_PROVIDER_SITE_OTHER): Payer: Medicare Other | Admitting: Physician Assistant

## 2015-11-12 VITALS — BP 150/68 | HR 52 | Ht 68.0 in | Wt 224.6 lb

## 2015-11-12 DIAGNOSIS — I482 Chronic atrial fibrillation, unspecified: Secondary | ICD-10-CM

## 2015-11-12 DIAGNOSIS — I10 Essential (primary) hypertension: Secondary | ICD-10-CM

## 2015-11-12 DIAGNOSIS — I350 Nonrheumatic aortic (valve) stenosis: Secondary | ICD-10-CM | POA: Diagnosis not present

## 2015-11-12 DIAGNOSIS — S99821D Other specified injuries of right foot, subsequent encounter: Secondary | ICD-10-CM | POA: Diagnosis not present

## 2015-11-12 DIAGNOSIS — S92511D Displaced fracture of proximal phalanx of right lesser toe(s), subsequent encounter for fracture with routine healing: Secondary | ICD-10-CM | POA: Diagnosis not present

## 2015-11-12 DIAGNOSIS — I5033 Acute on chronic diastolic (congestive) heart failure: Secondary | ICD-10-CM | POA: Diagnosis not present

## 2015-11-12 DIAGNOSIS — M79674 Pain in right toe(s): Secondary | ICD-10-CM | POA: Diagnosis not present

## 2015-11-12 MED ORDER — FUROSEMIDE 40 MG PO TABS: 40.0000 mg | ORAL_TABLET | Freq: Every day | ORAL | 1 refills | Status: DC

## 2015-11-12 MED ORDER — AMLODIPINE BESYLATE 5 MG PO TABS: 2.5000 mg | ORAL_TABLET | Freq: Every day | ORAL | 1 refills | Status: DC

## 2015-11-12 MED ORDER — AMLODIPINE BESYLATE 5 MG PO TABS: 5.0000 mg | ORAL_TABLET | Freq: Every day | ORAL | 1 refills | Status: DC

## 2015-11-12 NOTE — Progress Notes (Signed)
Cardiology Office Note    Date:  11/12/2015   ID:  Eric, Rivers [birth year], MRN [id number]  PCP:  Cathlean Cower, MD  Cardiologist: Dr.Nelson  No chief complaint on file.   History of Present Illness:  Eric Rivers is a 82 y.o. male  with a history of known diastolic HF, chronic AF, status post emergent surgery for ascending aortic dissection back in 2014 with reimplantation of his coronary arteries. He has chronic stable exertional shortness of breath. Other issues include aortic stenosis, chronic anemia, bladder cancer, DM, and GERD. Other issues as noted below.   When seen by Dr. Meda Coffee at the end of July with fluid overload and Lasix was increased. Hemoglobin was 8.7 and was started on iron. Seems to be chronic. Remained on Coumadin. Saw Truitt Merle back 09/26/15 and was doing well. She decreased his Lasix back to 40 mg daily. Referral made to hematology.  Patient has since decreased his Lasix to 20 mg daily because he doesn't like running to the bathroom all the time. He also enjoys salt. His weight is up 5 pounds since she was last seen here. He does have dyspnea on exertion with short distances and increased edema. He didn't take his Lasix today because of this appointment and he has an appointment with an orthopedist this afternoon because he broke his toe and is in a boot. He has a dental extraction scheduled for tomorrow morning so doesn't want to take his Lasix before that.     Past Medical History:  Diagnosis Date  . ACE-inhibitor cough   . Aortic dissection [facility])    Surgical repair February, 2014  . Aortic stenosis    mild....echo... september... 2010/ mild... echo.[name] KitchenMarland KitchenDec, 2011  . Atrial fibrillation (HCC)     Chronic,   24 hour holter, September, 2011.... atrial fib rate is controlled.... there is some bradycardia but  no marked pauses  . Bladder cancer [facility])    recurrence with TUR-B March '09  . DDD (degenerative disc disease)    lumbar spine  .  Diabetes mellitus    type 2  . Diabetes mellitus with neuropathy ([place]) 12/04/2006   Qualifier: Diagnosis of  By: Linda Hedges MD, Heinz Knuckles  medications - DPP4   . Diverticulosis of colon with hemorrhage 01/01/2014  . Ejection fraction    EF 60%, echo, 01/2010  . Fluid overload   . Fracture of metacarpal bone 11/30/11  . GERD (gastroesophageal reflux disease)   . Gout   . History of echocardiogram    Echo 5/17 - mild LVH, EF 55-60%, mod AS (mean 16 mmHg, peak 29 mmHg), MAC, midl MR, massive BAE, mod dilated RV, low normal RVSF, mod TR, PASP 64 mmHg  . Hx of colonoscopy    approx. 10 years  . Hypercholesterolemia   . Hyperlipidemia   . Hypertension   . Mitral regurgitation   . Normal nuclear stress test    06/2005 , also ABI normal 2007  . Prostate cancer ([place])    radiation tx.  . Pulmonary hypertension ([place])    28mmHg, echo, 01/2010  . Shortness of breath    on exertion  . Warfarin anticoagulation    Atrial fib  . Whooping cough     Past Surgical History:  Procedure Laterality Date  . ASCENDING AORTIC ROOT REPLACEMENT N/A 04/15/2012   Procedure: ASCENDING AORTIC ROOT REPLACEMENT;  Surgeon: Gaye Pollack, MD;  Location: [facility];  Service: Open Heart Surgery;  Laterality: N/A;  .  bladder cancer biopsy  10/16/2010   negative for malignancy  . bladder microscopic  04/13/2007   high grade papillary urothelial lesions  . COLONOSCOPY N/A 01/01/2014   Procedure: COLONOSCOPY;  Surgeon: Gatha Mayer, MD;  [facility];  Service: Endoscopy;  Laterality: N/A;  . CYSTOSCOPY/RETROGRADE/URETEROSCOPY Bilateral 04/04/2013   Procedure: CYSTOSCOPY WITH RETROGRADE PYELOGRAM AND BLADDER BIOPSY;  Surgeon: Molli Hazard, MD;  [facility];  Service: Urology;  Laterality: Bilateral;  . 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 EXPLORATION POST OPERATIVE OPEN HEART  04/2012  . Herniatic disc surgery    . LUMBAR LAMINECTOMY     '02  . PROSTATE BIOPSY   09/25/2011   GLEASON 3+3=6 AND 4+3=7  . TEE WITHOUT CARDIOVERSION N/A 04/15/2012   Procedure: TRANSESOPHAGEAL ECHOCARDIOGRAM (TEE);  Surgeon: Gaye Pollack, MD;  Location: [facility];  Service: Open Heart Surgery;  Laterality: N/A;  . TRANSURETHRAL RESECTION OF BLADDER TUMOR N/A 06/13/2015   Procedure: TRANSURETHRAL RESECTION OF PROSTATE (TURP) CLOT EVACUATION AND FULGERATION, BLADDER STONE REMOVAL ;  Surgeon: Alexis Frock, MD;  [facility];  Service: Urology;  Laterality: N/A;  . TUR-BT '06, '09      Current Medications: Outpatient Medications Prior to Visit  Medication Sig Dispense Refill  . acetaminophen (TYLENOL) 500 MG tablet Take 1,000 mg by mouth 3 (three) times daily as needed for mild pain, moderate pain, fever or headache.     . allopurinol (ZYLOPRIM) 300 MG tablet Take 1 tablet (300 mg total) by mouth daily. 90 tablet 1  . amLODipine (NORVASC) 2.5 MG tablet Take 1 tablet (2.5 mg total) by mouth daily. 90 tablet 3  . aspirin EC 325 MG tablet Take 325 mg by mouth daily.    [name] Kitchen atorvastatin (LIPITOR) 20 MG tablet Take 1 tablet (20 mg total) by mouth daily. 30 tablet 3  . Calcium Carb-Cholecalciferol (CALCIUM 600 + D PO) Take 1 tablet by mouth at bedtime.     [name] Kitchen desonide (DESOWEN) 0.05 % lotion Apply 1 application topically daily as needed (rosacea).     . Ferrous Sulfate (IRON) 325 (65 Fe) MG TABS Take 325 mg by mouth 2 (two) times daily. 180 each 3  . finasteride (PROSCAR) 5 MG tablet Take 1 tablet (5 mg total) by mouth every morning. 90 tablet 3  . furosemide (LASIX) 40 MG tablet take 1 tablet by mouth once daily if needed for FLUID OR EDEMA 90 tablet 0  . JANUVIA 100 MG tablet take 1 tablet by mouth once daily 90 tablet 2  . ketoconazole (NIZORAL) 2 % shampoo Apply 1 application topically 4 (four) times a week.    . mirabegron ER (MYRBETRIQ) 50 MG TB24 tablet Take 50 mg by mouth daily.    . Multiple Vitamins-Minerals (ONCOVITE PO) Take 1 tablet by mouth daily.     . pantoprazole  (PROTONIX) 40 MG tablet Take 1 tablet (40 mg total) by mouth daily. 90 tablet 1  . potassium chloride (K-DUR,KLOR-CON) 10 MEQ tablet Take 1 tablet (10 mEq total) by mouth daily. 90 tablet 3  . senna (SENOKOT) 8.6 MG tablet Take 1 tablet by mouth at bedtime.     . Tamsulosin HCl (FLOMAX) 0.4 MG CAPS Take 0.4 mg by mouth at bedtime.     . traZODone (DESYREL) 50 MG tablet Take 1 tablet (50 mg total) by mouth as directed. 30 tablet 3  . warfarin (COUMADIN) 4 MG tablet Take 4 mg by mouth  daily. Except thrusday (6mg  )  0  . furosemide (LASIX) 40 MG tablet Take 1 tablet (40 mg total) by mouth daily. (Patient not taking: Reported on 11/12/2015) 30 tablet    No facility-administered medications prior to visit.      Allergies:   Metoprolol   Social History   Social History  . Marital status: Single    Spouse name: N/A  . Number of children: 0  . Years of education: 34   Occupational History  . history and Advice worker Retired    retired   Social History Main Topics  . Smoking status: Former Smoker    Packs/day: 1.00    Types: Cigarettes    Quit date: 03/04/1975  . Smokeless tobacco: Never Used  . Alcohol use 7.0 oz/week    14 drink(s) per week     Comment: liquor daily  . Drug use: No  . Sexual activity: Not Currently   Other Topics Concern  . None   Social History Narrative   Confirmed batchelor. Retired Education officer, museum. World traveler- Ambulance person. I- ADLS. End of life Care   No CPR, no prolonged intubation, no prolonged artificial hydration or feeding, no heroic or futile measures.       Next trip Sept [place]13 [place]      Last cruise - two cruises back to back to the [place] in January '15. May'15: [place] to [place] with stops in [place], [place], [place], [place], [place]. 15 days      Fall '15: 50 Days from [employer]. [place], [place], [place], [place], [place], [place], [place], [place], [place].        May '15 - 15 days up the [employer] as far as [place]              Family History:  The patient's family history includes Cancer in his father; Heart attack in his mother; Heart disease in his mother; Hypertension in his mother; Prostate cancer (age of onset: 92) in his father; Stroke in his father.   ROS:   Please see the history of present illness.    Review of Systems  Constitution: Negative.  HENT: Negative.   Cardiovascular: Positive for dyspnea on exertion, irregular heartbeat and leg swelling.  Respiratory: Positive for shortness of breath.   Endocrine: Negative.   Hematologic/Lymphatic: Negative.   Musculoskeletal: Negative.        Broken toe on his right foot in a boot  Gastrointestinal: Negative.   Genitourinary: Negative.   Neurological: Positive for loss of balance.   All other systems reviewed and are negative.   PHYSICAL EXAM:   VS:  BP (!) 150/68   Pulse (!) 52   Ht 5\' 8"  (1.727 m)   Wt 224 lb 9.6 oz (101.9 kg)   SpO2 97%   BMI 34.15 kg/m   Physical Exam  GEN: Obese, in no acute distress  Neck: no JVD, carotid bruits, or masses Cardiac: Irregular irregular with 2/6 harsh systolic murmur at the left sternal border, no rubs, or gallops  Respiratory:  Decreased breath sounds but clear to auscultation bilaterally, normal work of breathing GI: soft, nontender, nondistended, + BS Ext: 2+ lower extremity edema bilaterally with some erythema, right foot in boot, decreased distal pulses  MS: no deformity or atrophy  Skin: warm and dry, no rash Psych: euthymic mood, full affect  Wt Readings from Last 3 Encounters:  11/12/15 224 lb 9.6 oz (  101.9 kg)  10/23/15 219 lb (99.3 kg)  10/19/15 218 lb (98.9 kg)      Studies/Labs Reviewed:   EKG:  EKG is not ordered today.   Recent Labs: 04/10/2015: TSH 1.48 09/07/2015: Pro B Natriuretic peptide (BNP) 566.0 09/26/2015: Brain Natriuretic Peptide 334.0 10/10/2015: ALT 13; BUN 15.1; Creatinine 0.9; HGB 9.3; Platelets 262;  Potassium 4.2; Sodium 139   Lipid Panel    Component Value Date/Time   CHOL 102 04/10/2015 1230   TRIG 57.0 04/10/2015 1230   HDL 45.60 04/10/2015 1230   CHOLHDL 2 04/10/2015 1230   VLDL 11.4 04/10/2015 1230   LDLCALC 45 04/10/2015 1230    Additional studies/ records that were reviewed today include:   2-D echo 07/03/15 Study Conclusions   - Left ventricle: The cavity size was normal. Wall thickness was   increased in a pattern of mild LVH. Systolic function was normal.   The estimated ejection fraction was in the range of 55% to 60%.   The study is not technically sufficient to allow evaluation of LV   diastolic function. - Aortic valve: Mildly calcified leaflets. Moderate stenosis. Mean   gradient (S): 16 mm Hg. Peak gradient (S): 29 mm Hg. Valve area   (VTI): 1.09 cm^2. Valve area (Vmax): 1.2 cm^2. - Mitral valve: Calcified annulus. Mildly thickened leaflets .   There was mild regurgitation. - Left atrium: Massively dilated at 71 ml/m2. - Right ventricle: The cavity size was moderately dilated. Systolic   function is low normal. - Right atrium: Massively dilated at 50 cm2. - Tricuspid valve: There was moderate regurgitation. - Pulmonary arteries: PA peak pressure: 64 mm Hg (S). - Inferior vena cava: The vessel was dilated. The respirophasic   diameter changes were blunted (< 50%), consistent with elevated   central venous pressure.   Impressions:   - Compared to a prior echo in 2015, there is now moderate aortic   stenosis with AVA around 1.1 cm2.  Chest CTA 9/16 IMPRESSION: 1. Stable appearance of ascending aortic repair, and persistent descending and abdominal aortic dissection flap as detailed above. 2. 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 3. Marked biatrial enlargement. Thrombus in the left atrial appendage is more conspicuous than on prior exam. 4. Extension of dissection flap through the main right renal artery and into the proximal left common  iliac artery, stable compared to prior exam. 5. Cholelithiasis 6. Bladder calculus.          ASSESSMENT:    1. Acute on chronic diastolic (congestive) heart failure ([place])   2. Chronic atrial fibrillation (HCC)   3. Aortic stenosis   4. Essential hypertension      PLAN:  In order of problems listed above:  Acute on chronic diastolic heart failure: Patient has 5 pound weight gain and increased edema and dyspnea on exertion. He has cut back on his Lasix to half a tablet daily and doesn't take it if he has any appointments or goes out during the day. Instructed the patient to take Lasix 40 mg once daily and decrease sodium in his diet. Follow-up with Dr. Meda Coffee in 4 weeks  Moderate aortic stenosis on echo in 07/2015 continue to monitor   Chronic atrial fibrillation: Rate controlled not on any rate lowering medications. On Coumadin.  Essential hypertension blood pressure is elevated today increase Norvasc to 5 mg once daily       Medication Adjustments/Labs and Tests Ordered: Current medicines are reviewed at length with the patient today.  Concerns regarding medicines are outlined above.  Medication changes, Labs and Tests ordered today are listed in the Patient Instructions below. Patient Instructions  Medication Instructions:     If you need a refill on your cardiac medications before your next appointment, please call your pharmacy.  Labwork:   Testing/Procedures:   Follow-Up:   Any Other Special Instructions Will Be Listed Below (If Applicable).                                                                                                                                                      Sumner Boast, PA-C  11/12/2015 11:04 AM    [facility] [address], [place], [place]  [address] Phone: [phone]; Fax: [phone]

## 2015-11-12 NOTE — Patient Instructions (Addendum)
Medication Instructions:   START TAKING AMLODIPINE 5 MG ONCE A DAY   START TAKING LASIX 40 MG  ONCE A DAY   If you need a refill on your cardiac medications before your next appointment, please call your pharmacy.  Labwork: NONE ORDER TODAY    Testing/Procedures: NONE ORDER TODAY    Follow-Up: WITH DR NELSON IN 4 TO 6 WEEKS    Any Other Special Instructions Will Be Listed Below (If Applicable).   Low-Sodium Eating Plan Sodium raises blood pressure and causes water to be held in the body. Getting less sodium from food will help lower your blood pressure, reduce any swelling, and protect your heart, liver, and kidneys. We get sodium by adding salt (sodium chloride) to food. Most of our sodium comes from canned, boxed, and frozen foods. Restaurant foods, fast foods, and pizza are also very high in sodium. Even if you take medicine to lower your blood pressure or to reduce fluid in your body, getting less sodium from your food is important.  WHAT IS MY PLAN? Most people should limit their sodium intake to 2,000 mg a  day.   WHAT DO I NEED TO KNOW ABOUT THIS EATING PLAN? For the low-sodium eating plan, you will follow these general guidelines:  Choose foods with a % Daily Value for sodium of less than 5% (as listed on the food label).   Use salt-free seasonings or herbs instead of table salt or sea salt.   Check with your health care provider or pharmacist before using salt substitutes.   Eat fresh foods.  Eat more vegetables and fruits.  Limit canned vegetables. If you do use them, rinse them well to decrease the sodium.   Limit cheese to 1 oz (28 g) per day.   Eat lower-sodium products, often labeled as "lower sodium" or "no salt added."  Avoid foods that contain monosodium glutamate (MSG). MSG is sometimes added to [place] food and some canned foods.  Check food labels (Nutrition Facts labels) on foods to learn how much sodium is in one serving.  Eat more  home-cooked food and less restaurant, buffet, and fast food.  When eating at a restaurant, ask that your food be prepared with less salt, or no salt if possible.   HOW DO I READ FOOD LABELS FOR SODIUM INFORMATION? The Nutrition Facts label lists the amount of sodium in one serving of the food. If you eat more than one serving, you must multiply the listed amount of sodium by the number of servings. Food labels may also identify foods as:  Sodium free--Less than 5 mg in a serving.  Very low sodium--35 mg or less in a serving.  Low sodium--140 mg or less in a serving.  Light in sodium--50% less sodium in a serving. For example, if a food that usually has 300 mg of sodium is changed to become light in sodium, it will have 150 mg of sodium.  Reduced sodium--25% less sodium in a serving. For example, if a food that usually has 400 mg of sodium is changed to reduced sodium, it will have 300 mg of sodium.  WHAT FOODS CAN I EAT? Grains Low-sodium cereals, including oats, puffed wheat and rice, and shredded wheat cereals. Low-sodium crackers. Unsalted rice and pasta. Lower-sodium bread.  Vegetables Frozen or fresh vegetables. Low-sodium or reduced-sodium canned vegetables. Low-sodium or reduced-sodium tomato sauce and paste. Low-sodium or reduced-sodium tomato and vegetable juices.  Fruits Fresh, frozen, and canned fruit. Fruit juice.  Meat and Other  Protein Products Low-sodium canned tuna and salmon. Fresh or frozen meat, poultry, seafood, and fish. Lamb. Unsalted nuts. Dried beans, peas, and lentils without added salt. Unsalted canned beans. Homemade soups without salt. Eggs.  Dairy Milk. Soy milk. Ricotta cheese. Low-sodium or reduced-sodium cheeses. Yogurt.  Condiments Fresh and dried herbs and spices. Salt-free seasonings. Onion and garlic powders. Low-sodium varieties of mustard and ketchup. Fresh or refrigerated horseradish. Lemon juice.  Fats and Oils Reduced-sodium salad  dressings. Unsalted butter.  Other Unsalted popcorn and pretzels.  The items listed above may not be a complete list of recommended foods or beverages. Contact your dietitian for more options. WHAT FOODS ARE NOT RECOMMENDED? Grains Instant hot cereals. Bread stuffing, pancake, and biscuit mixes. Croutons. Seasoned rice or pasta mixes. Noodle soup cups. Boxed or frozen macaroni and cheese. Self-rising flour. Regular salted crackers. Vegetables Regular canned vegetables. Regular canned tomato sauce and paste. Regular tomato and vegetable juices. Frozen vegetables in sauces. Salted [place] fries. Olives. Angie Fava. Relishes. Sauerkraut. Salsa. Meat and Other Protein Products Salted, canned, smoked, spiced, or pickled meats, seafood, or fish. Bacon, ham, sausage, hot dogs, corned beef, chipped beef, and packaged luncheon meats. Salt pork. Jerky. Pickled herring. Anchovies, regular canned tuna, and sardines. Salted nuts. Dairy Processed cheese and cheese spreads. Cheese curds. Blue cheese and cottage cheese. Buttermilk.  Condiments Onion and garlic salt, seasoned salt, table salt, and sea salt. Canned and packaged gravies. Worcestershire sauce. Tartar sauce. Barbecue sauce. Teriyaki sauce. Soy sauce, including reduced sodium. Steak sauce. Fish sauce. Oyster sauce. Cocktail sauce. Horseradish that you find on the shelf. Regular ketchup and mustard. Meat flavorings and tenderizers. Bouillon cubes. Hot sauce. Tabasco sauce. Marinades. Taco seasonings. Relishes. Fats and Oils Regular salad dressings. Salted butter. Margarine. Ghee. Bacon fat.  Other Potato and tortilla chips. Corn chips and puffs. Salted popcorn and pretzels. Canned or dried soups. Pizza. Frozen entrees and pot pies.  The items listed above may not be a complete list of foods and beverages to avoid. Contact your dietitian for more information.   This information is not intended to replace advice given to you by your health care  provider. Make sure you discuss any questions you have with your health care provider.   Document Released: 08/09/2001 Document Revised: 03/10/2014 Document Reviewed: 12/22/2012 Elsevier Interactive Patient Education [employer].

## 2015-11-19 ENCOUNTER — Ambulatory Visit (INDEPENDENT_AMBULATORY_CARE_PROVIDER_SITE_OTHER): Payer: Medicare Other | Admitting: General Practice

## 2015-11-19 DIAGNOSIS — I482 Chronic atrial fibrillation, unspecified: Secondary | ICD-10-CM

## 2015-11-19 DIAGNOSIS — Z5181 Encounter for therapeutic drug level monitoring: Secondary | ICD-10-CM

## 2015-11-19 DIAGNOSIS — I272 Other secondary pulmonary hypertension: Secondary | ICD-10-CM

## 2015-11-19 DIAGNOSIS — Z7901 Long term (current) use of anticoagulants: Secondary | ICD-10-CM | POA: Diagnosis not present

## 2015-11-19 LAB — POCT INR: INR: 2.2

## 2015-11-21 ENCOUNTER — Ambulatory Visit
Admission: RE | Admit: 2015-11-21 | Discharge: 2015-11-21 | Disposition: A | Discharge status: Home / Self Care | Payer: Medicare Other | Source: Ambulatory Visit | Attending: Surgery | Admitting: Surgery

## 2015-11-21 ENCOUNTER — Encounter: Payer: Self-pay | Admitting: Surgery

## 2015-11-21 ENCOUNTER — Ambulatory Visit (INDEPENDENT_AMBULATORY_CARE_PROVIDER_SITE_OTHER): Payer: Medicare Other | Admitting: Surgery

## 2015-11-21 VITALS — BP 150/80 | HR 82 | Resp 18 | Ht 68.0 in | Wt 212.0 lb

## 2015-11-21 DIAGNOSIS — I7101 Dissection of ascending aorta: Secondary | ICD-10-CM

## 2015-11-21 DIAGNOSIS — I71019 Dissection of thoracic aorta, unspecified: Secondary | ICD-10-CM

## 2015-11-21 DIAGNOSIS — Z09 Encounter for follow-up examination after completed treatment for conditions other than malignant neoplasm: Secondary | ICD-10-CM

## 2015-11-21 DIAGNOSIS — I712 Thoracic aortic aneurysm, without rupture: Secondary | ICD-10-CM | POA: Diagnosis not present

## 2015-11-21 MED ORDER — IOPAMIDOL (ISOVUE-370) INJECTION 76%
75.0000 mL | Freq: Once | INTRAVENOUS | Status: AC | PRN
  Administered 2015-11-21: 75 mL via INTRAVENOUS

## 2015-11-27 ENCOUNTER — Encounter: Payer: Self-pay | Admitting: Surgery

## 2015-11-27 NOTE — Progress Notes (Signed)
HPI:  The patient returns today for follow up of a type A dissection, s/p repair with a supracoronary tube graft on 04/15/2012. He denies any chest or back pain. He says he has had a rough year and has developed urinary incontinence which has prevented him from traveling anymore which he has always loved to do.  Current Outpatient Prescriptions  Medication Sig Dispense Refill  . acetaminophen (TYLENOL) 500 MG tablet Take 1,000 mg by mouth 3 (three) times daily as needed for mild pain, moderate pain, fever or headache.     . allopurinol (ZYLOPRIM) 300 MG tablet Take 1 tablet (300 mg total) by mouth daily. 90 tablet 1  . amLODipine (NORVASC) 5 MG tablet Take 1 tablet (5 mg total) by mouth daily. 90 tablet 1  . aspirin EC 325 MG tablet Take 325 mg by mouth daily.    [name] Kitchen atorvastatin (LIPITOR) 20 MG tablet take 1 tablet by mouth once daily 90 tablet 0  . Calcium Carb-Cholecalciferol (CALCIUM 600 + D PO) Take 1 tablet by mouth at bedtime.     [name] Kitchen desonide (DESOWEN) 0.05 % lotion Apply 1 application topically daily as needed (rosacea).     . Ferrous Sulfate (IRON) 325 (65 Fe) MG TABS Take 325 mg by mouth 2 (two) times daily. 180 each 3  . finasteride (PROSCAR) 5 MG tablet Take 1 tablet (5 mg total) by mouth every morning. 90 tablet 3  . furosemide (LASIX) 40 MG tablet Take 1 tablet (40 mg total) by mouth daily. 90 tablet 1  . JANUVIA 100 MG tablet take 1 tablet by mouth once daily 90 tablet 2  . ketoconazole (NIZORAL) 2 % shampoo Apply 1 application topically 4 (four) times a week.    . mirabegron ER (MYRBETRIQ) 50 MG TB24 tablet Take 50 mg by mouth daily.    . Multiple Vitamins-Minerals (ONCOVITE PO) Take 1 tablet by mouth daily.     . pantoprazole (PROTONIX) 40 MG tablet Take 1 tablet (40 mg total) by mouth daily. 90 tablet 1  . potassium chloride (K-DUR,KLOR-CON) 10 MEQ tablet Take 1 tablet (10 mEq total) by mouth daily. 90 tablet 3  . senna (SENOKOT) 8.6 MG tablet Take 1 tablet by mouth at  bedtime.     . Tamsulosin HCl (FLOMAX) 0.4 MG CAPS Take 0.4 mg by mouth at bedtime.     . traZODone (DESYREL) 50 MG tablet Take 1 tablet (50 mg total) by mouth as directed. 30 tablet 3  . warfarin (COUMADIN) 4 MG tablet Take 4 mg by mouth daily. Except thrusday (6mg  )  0   No current facility-administered medications for this visit.      Physical Exam: BP (!) 150/80   Pulse 82   Resp 18   Ht 5\' 8"  (1.727 m)   Wt 212 lb (96.2 kg)   SpO2 96% Comment: ON RA  BMI 32.23 kg/m  He looks well Cardiac exam shows a regular rate and rhythm with normal heart sounds and no murmurs. Lungs are clear   Diagnostic Tests:  CLINICAL DATA:  Ascending thoracic aortic dissection.  EXAM: CT ANGIOGRAPHY CHEST WITH CONTRAST  TECHNIQUE: Multidetector CT imaging of the chest was performed using the standard protocol during bolus administration of intravenous contrast. Multiplanar CT image reconstructions and MIPs were obtained to evaluate the vascular anatomy.  CONTRAST:  75 mL of Isovue 370 intravenously.  COMPARISON:  CT scan of November 29, 2014.  FINDINGS: Cardiovascular: Status post surgical repair of ascending  thoracic aortic dissection. These findings are stable. Aneurysmal dilatation of the proximal portion of the aortic arch is noted with max measured diameter 5.1 cm, which is not significantly changed compared to prior exam. Great vessels are widely patent without significant stenosis. Right innominate artery is mildly dilated. Atherosclerosis of thoracic aorta is noted. Distal portion of aortic arch measures 4.9 cm in diameter. Distal portion of descending thoracic aorta measures 3.2 cm which is stable and consistent with mild dilatation. Stable thrombosed false lumen is noted in the distal aortic arch and proximal descending thoracic aorta.  Coronary artery calcifications are noted.  Mediastinum/Nodes: No mediastinal mass or adenopathy is noted.  Lungs/Pleura: No  pneumothorax or pleural effusion is noted. Subsegmental atelectasis is noted inferiorly in the lingular segment of left upper lobe. Stable 7 mm nodule is noted in right lower lobe best seen on image number 55 of series 5.  Upper Abdomen: There is noted a dissection beginning in the distal portion of the descending thoracic aorta which extends into the visualized portion of the proximal abdominal aorta. This is stable compared to prior exam. The celiac and superior mesenteric arteries are visualized and appear to be widely patent.  Musculoskeletal: No significant osseous abnormality is noted.  Review of the MIP images confirms the above findings.  IMPRESSION: Coronary artery calcifications are noted suggesting coronary artery disease.  Status post surgical repair of ascending thoracic aortic dissection. These findings are stable.  5.1 cm aneurysm involving the proximal portion of the thoracic aortic arch is again noted and stable. Recommend semi-annual imaging followup by CTA or MRA and referral to cardiothoracic surgery if not already obtained. This recommendation follows 2010 ACCF/AHA/AATS/ACR/ASA/SCA/SCAI/SIR/STS/SVM Guidelines for the Diagnosis and Management of Patients With Thoracic Aortic Disease. Circulation. 2010; 121: e266-e36.  Stable thrombosed false lumen is noted in the distal aortic arch and proximal descending thoracic aorta compared to prior exam.   Electronically Signed   By: Marijo Conception, M.D.   On: 11/21/2015 13:47   Impression:  The aortic dissection looks stable with no change in the size of the aortic arch which is moderately aneurysmal at 5.1 cm. The false lumen in the descending thoracic aorta is thrombosed and significantly smaller with remodeling of the aorta. I have again stressed the importance of good blood pressure control.  Plan:  I will see him back in one year with a CTA of the chest. He will continue to follow up with Dr.  Jenny Reichmann for his medical care.   Gaye Pollack, MD [facility] [phone]

## 2015-12-04 DIAGNOSIS — Z23 Encounter for immunization: Secondary | ICD-10-CM | POA: Diagnosis not present

## 2015-12-07 ENCOUNTER — Other Ambulatory Visit: Payer: Self-pay | Admitting: *Deleted

## 2015-12-07 DIAGNOSIS — D509 Iron deficiency anemia, unspecified: Secondary | ICD-10-CM

## 2015-12-10 ENCOUNTER — Other Ambulatory Visit (HOSPITAL_BASED_OUTPATIENT_CLINIC_OR_DEPARTMENT_OTHER): Payer: Medicare Other

## 2015-12-10 ENCOUNTER — Encounter: Payer: Self-pay | Admitting: Hematology

## 2015-12-10 ENCOUNTER — Ambulatory Visit (HOSPITAL_BASED_OUTPATIENT_CLINIC_OR_DEPARTMENT_OTHER): Payer: Medicare Other | Admitting: Hematology

## 2015-12-10 ENCOUNTER — Telehealth: Payer: Self-pay | Admitting: Hematology

## 2015-12-10 VITALS — BP 163/81 | HR 76 | Temp 98.1°F | Resp 18 | Wt 219.9 lb

## 2015-12-10 DIAGNOSIS — D509 Iron deficiency anemia, unspecified: Secondary | ICD-10-CM | POA: Diagnosis not present

## 2015-12-10 DIAGNOSIS — D508 Other iron deficiency anemias: Secondary | ICD-10-CM

## 2015-12-10 DIAGNOSIS — D890 Polyclonal hypergammaglobulinemia: Secondary | ICD-10-CM

## 2015-12-10 DIAGNOSIS — E538 Deficiency of other specified B group vitamins: Secondary | ICD-10-CM

## 2015-12-10 LAB — COMPREHENSIVE METABOLIC PANEL
ALT: 13 U/L (ref 0–55)
ANION GAP: 9 meq/L (ref 3–11)
AST: 20 U/L (ref 5–34)
Albumin: 3.8 g/dL (ref 3.5–5.0)
Alkaline Phosphatase: 97 U/L (ref 40–150)
BUN: 12 mg/dL (ref 7.0–26.0)
CHLORIDE: 105 meq/L (ref 98–109)
CO2: 27 meq/L (ref 22–29)
Calcium: 9.5 mg/dL (ref 8.4–10.4)
Creatinine: 0.9 mg/dL (ref 0.7–1.3)
EGFR: 79 mL/min/{1.73_m2} — AB (ref >90)
GLUCOSE: 139 mg/dL (ref 70–140)
Potassium: 4.3 mEq/L (ref 3.5–5.1)
SODIUM: 141 meq/L (ref 136–145)
Total Bilirubin: 0.81 mg/dL (ref 0.20–1.20)
Total Protein: 7.3 g/dL (ref 6.4–8.3)

## 2015-12-10 LAB — CBC WITH DIFFERENTIAL/PLATELET
BASO%: 0.2 % (ref 0.0–2.0)
Basophils Absolute: 0 10*3/uL (ref 0.0–0.1)
EOS%: 1.8 % (ref 0.0–7.0)
Eosinophils Absolute: 0.2 10*3/uL (ref 0.0–0.5)
HCT: 35.2 % — ABNORMAL LOW (ref 38.4–49.9)
HGB: 11.1 g/dL — ABNORMAL LOW (ref 13.0–17.1)
LYMPH%: 16.5 % (ref 14.0–49.0)
MCH: 27.6 pg (ref 27.2–33.4)
MCHC: 31.5 g/dL — AB (ref 32.0–36.0)
MCV: 87.6 fL (ref 79.3–98.0)
MONO#: 0.6 10*3/uL (ref 0.1–0.9)
MONO%: 7 % (ref 0.0–14.0)
NEUT#: 6 10*3/uL (ref 1.5–6.5)
NEUT%: 74.5 % (ref 39.0–75.0)
PLATELETS: 166 10*3/uL (ref 140–400)
RBC: 4.02 10*6/uL — AB (ref 4.20–5.82)
RDW: 21 % — ABNORMAL HIGH (ref 11.0–14.6)
WBC: 8.1 10*3/uL (ref 4.0–10.3)
lymph#: 1.3 10*3/uL (ref 0.9–3.3)

## 2015-12-10 NOTE — Telephone Encounter (Signed)
Gave patient avs report and appointments for February. Per 10/9 los patient to return in 4 months

## 2015-12-10 NOTE — Telephone Encounter (Signed)
Gave patient avs report and appointments for January  °

## 2015-12-11 LAB — IRON AND TIBC
%SAT: 27 % (ref 20–55)
IRON: 99 ug/dL (ref 42–163)
TIBC: 361 ug/dL (ref 202–409)
UIBC: 263 ug/dL (ref 117–376)

## 2015-12-11 LAB — FERRITIN: FERRITIN: 40 ng/mL (ref 22–316)

## 2015-12-17 ENCOUNTER — Ambulatory Visit (INDEPENDENT_AMBULATORY_CARE_PROVIDER_SITE_OTHER): Payer: Medicare Other | Admitting: General Practice

## 2015-12-17 DIAGNOSIS — Z5181 Encounter for therapeutic drug level monitoring: Secondary | ICD-10-CM

## 2015-12-17 DIAGNOSIS — I482 Chronic atrial fibrillation, unspecified: Secondary | ICD-10-CM

## 2015-12-17 DIAGNOSIS — I272 Pulmonary hypertension, unspecified: Secondary | ICD-10-CM

## 2015-12-17 DIAGNOSIS — Z7901 Long term (current) use of anticoagulants: Secondary | ICD-10-CM

## 2015-12-17 LAB — POCT INR: INR: 2

## 2015-12-17 NOTE — Progress Notes (Signed)
I agree with this plan.

## 2015-12-31 ENCOUNTER — Other Ambulatory Visit: Payer: Self-pay | Admitting: Internal Medicine

## 2016-01-04 ENCOUNTER — Ambulatory Visit (INDEPENDENT_AMBULATORY_CARE_PROVIDER_SITE_OTHER): Payer: Medicare Other | Admitting: Urgent Care

## 2016-01-04 VITALS — BP 132/80 | HR 68 | Temp 98.2°F | Resp 18 | Ht 68.0 in | Wt 219.2 lb

## 2016-01-04 DIAGNOSIS — M25561 Pain in right knee: Secondary | ICD-10-CM

## 2016-01-04 DIAGNOSIS — I509 Heart failure, unspecified: Secondary | ICD-10-CM | POA: Diagnosis not present

## 2016-01-04 DIAGNOSIS — W19XXXA Unspecified fall, initial encounter: Secondary | ICD-10-CM

## 2016-01-04 DIAGNOSIS — I482 Chronic atrial fibrillation, unspecified: Secondary | ICD-10-CM

## 2016-01-04 NOTE — Patient Instructions (Addendum)
Please take 500mg  Tylenol every 6 hours for pain and soreness of your right leg. Please look for swelling, redness, worsening pain of your right lower leg or calf. Call and let me know so that we can step up our work up.     IF you received an x-ray today, you will receive an invoice from [facility]. Please contact [facility] at [phone] with questions or concerns regarding your invoice.   IF you received labwork today, you will receive an invoice from [employer]. Please contact [facility] at [phone] with questions or concerns regarding your invoice.   Our billing staff will not be able to assist you with questions regarding bills from these companies.  You will be contacted with the lab results as soon as they are available. The fastest way to get your results is to activate your My Chart account. Instructions are located on the last page of this paperwork. If you have not heard from [place] regarding the results in 2 weeks, please contact this office.

## 2016-01-04 NOTE — Progress Notes (Signed)
MRN: [id number] DOB: [birth year]  Subjective:   Eric Rivers is a 82 y.o. male presenting for chief complaint of Fall [place] at home, lower right leg pain)  Reports 1 day history of a fall at home. Patient was sitting in a chair and reached down to pick up a slug, slipped off the chair and landed on his knees and rolled back onto his buttock. He has since progressively developed mild soreness of his right lower leg. Denies loss of consciousness, head trauma, confusion, back pain, buttock pain, incontinence, saddle paraesthesia, lower leg swelling, redness, numbness or tingling, laceration. He is on Coumadin, INRs are in therapeutic range. Has a history of atrial fibrillation, congestive heart failure. Denies chest pain, shob, dizziness. Has f/u with cardiology on 01/10/2016 with Dr. Meda Coffee, [facility].  Hansell has a current medication list which includes the following prescription(s): acetaminophen, allopurinol, amlodipine, aspirin ec, atorvastatin, calcium carb-cholecalciferol, desonide, iron, finasteride, furosemide, januvia, ketoconazole, mirabegron er, multiple vitamins-minerals, pantoprazole, potassium chloride, senna, tamsulosin, trazodone, and warfarin. Also is allergic to metoprolol.  Kollyn  has a past medical history of ACE-inhibitor cough; Aortic dissection ([place]); Aortic stenosis; Atrial fibrillation ([place]); Bladder cancer ([place]); DDD (degenerative disc disease); Diabetes mellitus; Diabetes mellitus with neuropathy ([place]) (12/04/2006); Diverticulosis of colon with hemorrhage (01/01/2014); Ejection fraction; Fluid overload; Fracture of metacarpal bone (11/30/11); GERD (gastroesophageal reflux disease); Gout; History of echocardiogram; colonoscopy; Hypercholesterolemia; Hyperlipidemia; Hypertension; Mitral regurgitation; Normal nuclear stress test; Prostate cancer ([place]); Pulmonary hypertension; Shortness of breath; Warfarin anticoagulation; and Whooping cough. Also  has a past surgical  history that includes Lumbar laminectomy; TUR-BT '06, '09; bladder cancer biopsy (10/16/2010); bladder microscopic (04/13/2007); Cystostomy w/ bladder biopsy (03/22/2004); ostate biopsy (09/25/2011); Ascending aortic root replacement (N/A, 04/15/2012); TEE without cardioversion (N/A, 04/15/2012); Exploration post operative open heart (04/2012); Cystoscopy/retrograde/ureteroscopy (Bilateral, 04/04/2013); Herniatic disc surgery; Colonoscopy (N/A, 01/01/2014); and Transurethral resection of bladder tumor (N/A, 06/13/2015).  Objective:   Vitals: BP 132/80   Pulse 68   Temp 98.2 F (36.8 C) (Oral)   Resp 18   Ht 5\' 8"  (1.727 m)   Wt 219 lb 3.2 oz (99.4 kg)   SpO2 95%   BMI 33.33 kg/m   Physical Exam  Constitutional: He is oriented to person, place, and time. He appears well-developed and well-nourished.  Cardiovascular: Normal rate, regular rhythm and intact distal pulses.  Exam reveals no gallop and no friction rub.   No murmur heard. Pulmonary/Chest: No respiratory distress. He has no wheezes. He has no rales.  Musculoskeletal:       Right knee: He exhibits normal range of motion, no swelling, no effusion, no ecchymosis, no deformity, no laceration, no erythema and normal patellar mobility. No tenderness found.       Right lower leg: He exhibits tenderness (mildly positive Homann sign). He exhibits no bony tenderness, no swelling (or erythema), no edema (Right calf measure 39cm compared to 40cm for left calf at 13cm below the patella), no deformity and no laceration.       Right foot: There is normal range of motion, no tenderness, no bony tenderness, no swelling, normal capillary refill, no crepitus, no deformity and no laceration.  Neurological: He is alert and oriented to person, place, and time.  Skin: Skin is warm and dry. No erythema.  Lipodermatosclerosis and superficial varicosities present in both lower legs.   Assessment and Plan :   This case was precepted with Dr. Brigitte Pulse.   1. Arthralgia  of right lower leg 2. Fall, initial encounter -  Physical exam findings and HPI reassuring. Will manage conservatively with scheduled Tylenol. Counseled patient on symptoms warranting recheck including lower leg swelling, redness and warmth none of which are present at the moment.  3. Chronic atrial fibrillation ([place]) 4. Congestive heart failure, unspecified congestive heart failure chronicity, unspecified congestive heart failure type ([place]) - Continue f/u with cardiology. Continue Coumadin.  Jaynee Eagles, PA-C [facility] [phone] 01/04/2016 11:35 AM

## 2016-01-07 ENCOUNTER — Other Ambulatory Visit: Payer: Self-pay | Admitting: General Practice

## 2016-01-07 ENCOUNTER — Other Ambulatory Visit: Payer: Self-pay | Admitting: Internal Medicine

## 2016-01-07 MED ORDER — WARFARIN SODIUM 4 MG PO TABS: 4.0000 mg | ORAL_TABLET | Freq: Every day | ORAL | 1 refills | Status: DC

## 2016-01-14 ENCOUNTER — Ambulatory Visit (INDEPENDENT_AMBULATORY_CARE_PROVIDER_SITE_OTHER): Payer: Medicare Other

## 2016-01-14 DIAGNOSIS — I272 Pulmonary hypertension, unspecified: Secondary | ICD-10-CM

## 2016-01-14 DIAGNOSIS — I482 Chronic atrial fibrillation, unspecified: Secondary | ICD-10-CM

## 2016-01-14 DIAGNOSIS — Z5181 Encounter for therapeutic drug level monitoring: Secondary | ICD-10-CM

## 2016-01-14 DIAGNOSIS — Z7901 Long term (current) use of anticoagulants: Secondary | ICD-10-CM

## 2016-01-14 LAB — POCT INR: INR: 2.1

## 2016-01-14 NOTE — Progress Notes (Signed)
Pre visit review using our clinic review tool, if applicable. No additional management support is needed unless otherwise documented below in the visit note. 

## 2016-01-14 NOTE — Patient Instructions (Signed)
Pre visit review using our clinic review tool, if applicable. No additional management support is needed unless otherwise documented below in the visit note. 

## 2016-01-14 NOTE — Progress Notes (Signed)
Subjective:   Eric Rivers is a 82 y.o. male who presents for Medicare Annual/Subsequent preventive examination.  The Patient was informed that the wellness visit is to identify future health risk and educate and initiate measures that can reduce risk for increased disease through the lifespan.   Describes health as fair, good or great? "good"  Review of Systems:  No ROS.  Medicare Wellness Visit.  Cardiac Risk Factors include: advanced age (>15men, >85 women);diabetes mellitus;dyslipidemia;family history of premature cardiovascular disease;hypertension;sedentary lifestyle;male gender;obesity (BMI >30kg/m2)   Sleep patterns: Sleeps in recliner (since breaking rib years ago, now more comfortable and close to bathroom). Up 4 times to void. Sleeps 6 hours total, naps during the day.   Home Safety/Smoke Alarms:  Smoke detectors in place.  Living environment; residence and Firearm Safety: Lives alone in 1 story home. Feels safe at home. No firearms.  Seat Belt Safety/Bike Helmet: wears seatbelt   Counseling:   Eye Exam-Last exam 10/2014 followed yearly by [place]. Will make appt.  Dental-Last exam 11/2015, every 4 by periodontist and general dentist Jarrett Soho)   Male:   CCS-Colonoscopy 01/01/14. Diverticulosis.    PSA- 02/14/2015, <0.01. Followed by Urology. H/O bladder and prostate cancer.        Objective:    Vitals: BP 138/80 (BP Location: Right Arm, Patient Position: Sitting, Cuff Size: Normal)   Pulse 68   Temp 97.5 F (36.4 C) (Oral)   Ht 5\' 8"  (1.727 m)   Wt 225 lb (102.1 kg)   SpO2 96%   BMI 34.21 kg/m   Body mass index is 34.21 kg/m.  Tobacco History  Smoking Status  . Former Smoker  . Packs/day: 1.00  . Types: Cigarettes  . Quit date: 03/04/1975  Smokeless Tobacco  . Never Used     Counseling given: Not Answered   Past Medical History:  Diagnosis Date  . ACE-inhibitor cough   . Aortic dissection [facility])    Surgical repair February, 2014  . Aortic  stenosis    mild....echo... september... 2010/ mild... echo.[name] KitchenMarland KitchenDec, 2011  . Atrial fibrillation (HCC)     Chronic,   24 hour holter, September, 2011.... atrial fib rate is controlled.... there is some bradycardia but  no marked pauses  . Bladder cancer [facility])    recurrence with TUR-B March '09  . DDD (degenerative disc disease)    lumbar spine  . Diabetes mellitus    type 2  . Diabetes mellitus with neuropathy ([place]) 12/04/2006   Qualifier: Diagnosis of  By: Linda Hedges MD, Heinz Knuckles  medications - DPP4   . Diverticulosis of colon with hemorrhage 01/01/2014  . Ejection fraction    EF 60%, echo, 01/2010  . Fluid overload   . Fracture of metacarpal bone 11/30/11  . GERD (gastroesophageal reflux disease)   . Gout   . History of echocardiogram    Echo 5/17 - mild LVH, EF 55-60%, mod AS (mean 16 mmHg, peak 29 mmHg), MAC, midl MR, massive BAE, mod dilated RV, low normal RVSF, mod TR, PASP 64 mmHg  . Hx of colonoscopy    approx. 10 years  . Hypercholesterolemia   . Hyperlipidemia   . Hypertension   . Mitral regurgitation   . Normal nuclear stress test    06/2005 , also ABI normal 2007  . Prostate cancer ([place])    radiation tx.  . Pulmonary hypertension    50mmHg, echo, 01/2010  . Shortness of breath    on exertion  . Warfarin anticoagulation  Atrial fib  . Whooping cough    Past Surgical History:  Procedure Laterality Date  . ASCENDING AORTIC ROOT REPLACEMENT N/A 04/15/2012   Procedure: ASCENDING AORTIC ROOT REPLACEMENT;  Surgeon: Gaye Pollack, MD;  Location: [place];  Service: Open Heart Surgery;  Laterality: N/A;  . bladder cancer biopsy  10/16/2010   negative for malignancy  . bladder microscopic  04/13/2007   high grade papillary urothelial lesions  . COLONOSCOPY N/A 01/01/2014   Procedure: COLONOSCOPY;  Surgeon: Gatha Mayer, MD;  [facility];  Service: Endoscopy;  Laterality: N/A;  . CYSTOSCOPY/RETROGRADE/URETEROSCOPY Bilateral 04/04/2013   Procedure: CYSTOSCOPY WITH  RETROGRADE PYELOGRAM AND BLADDER BIOPSY;  Surgeon: Molli Hazard, MD;  [facility];  Service: Urology;  Laterality: Bilateral;  . 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 EXPLORATION POST OPERATIVE OPEN HEART  04/2012  . Herniatic disc surgery    . LUMBAR LAMINECTOMY     '02  . PROSTATE BIOPSY  09/25/2011   GLEASON 3+3=6 AND 4+3=7  . TEE WITHOUT CARDIOVERSION N/A 04/15/2012   Procedure: TRANSESOPHAGEAL ECHOCARDIOGRAM (TEE);  Surgeon: Gaye Pollack, MD;  Location: [place];  Service: Open Heart Surgery;  Laterality: N/A;  . TRANSURETHRAL RESECTION OF BLADDER TUMOR N/A 06/13/2015   Procedure: TRANSURETHRAL RESECTION OF PROSTATE (TURP) CLOT EVACUATION AND FULGERATION, BLADDER STONE REMOVAL ;  Surgeon: Alexis Frock, MD;  [facility];  Service: Urology;  Laterality: N/A;  . TUR-BT '06, '09     Family History  Problem Relation Age of Onset  . Prostate cancer Father 1    passed with prostate ca  . Cancer Father   . Stroke Father   . Heart disease Mother   . Hypertension Mother   . Heart attack Mother    History  Sexual Activity  . Sexual activity: Not Currently    Outpatient Encounter Prescriptions as of 01/15/2016  Medication Sig  . acetaminophen (TYLENOL) 500 MG tablet Take 1,000 mg by mouth 3 (three) times daily as needed for mild pain, moderate pain, fever or headache.   . allopurinol (ZYLOPRIM) 300 MG tablet Take 1 tablet (300 mg total) by mouth daily.  [name] Kitchen amLODipine (NORVASC) 5 MG tablet Take 1 tablet (5 mg total) by mouth daily.  [name] Kitchen aspirin EC 325 MG tablet Take 325 mg by mouth daily.  [name] Kitchen atorvastatin (LIPITOR) 20 MG tablet take 1 tablet by mouth once daily  . Calcium Carb-Cholecalciferol (CALCIUM 600 + D PO) Take 1 tablet by mouth at bedtime.   [name] Kitchen desonide (DESOWEN) 0.05 % lotion Apply 1 application topically daily as needed (rosacea).   . Ferrous Sulfate (IRON) 325 (65 Fe) MG TABS Take 325 mg by mouth 2 (two) times daily. (Patient  taking differently: Take 650 mg by mouth daily. )  . finasteride (PROSCAR) 5 MG tablet Take 1 tablet (5 mg total) by mouth every morning.  . furosemide (LASIX) 40 MG tablet Take 1 tablet (40 mg total) by mouth daily.  [name] Kitchen JANUVIA 100 MG tablet take 1 tablet by mouth once daily  . ketoconazole (NIZORAL) 2 % shampoo Apply 1 application topically 4 (four) times a week.  . mirabegron ER (MYRBETRIQ) 50 MG TB24 tablet Take 50 mg by mouth daily.  . Multiple Vitamins-Minerals (ONCOVITE PO) Take 1 tablet by mouth daily.   . pantoprazole (PROTONIX) 40 MG tablet Take 1 tablet (40 mg total) by mouth daily.  . potassium chloride (K-DUR,KLOR-CON) 10 MEQ tablet Take 1 tablet (  10 mEq total) by mouth daily.  [name] Kitchen senna (SENOKOT) 8.6 MG tablet Take 1 tablet by mouth at bedtime.   . Tamsulosin HCl (FLOMAX) 0.4 MG CAPS Take 0.4 mg by mouth at bedtime.   . traZODone (DESYREL) 50 MG tablet take 1 tablet by mouth as directed  . warfarin (COUMADIN) 4 MG tablet take as directed [place]   No facility-administered encounter medications on file as of 01/15/2016.     Activities of Daily Living In your present state of health, do you have any difficulty performing the following activities: 01/15/2016 06/13/2015  Hearing? N N  Vision? N N  Difficulty concentrating or making decisions? N N  Walking or climbing stairs? Y Y  Dressing or bathing? N N  Doing errands, shopping? N N  Preparing Food and eating ? N -  Using the Toilet? N -  In the past six months, have you accidently leaked urine? Y -  Do you have problems with loss of bowel control? N -  Managing your Medications? N -  Managing your Finances? N -  Housekeeping or managing your Housekeeping? N -  Some recent data might be hidden    Patient Care Team: Biagio Borg, MD as PCP - General (Internal Medicine) Carlena Bjornstad, MD (Cardiology) Earnie Larsson, MD (Neurosurgery) Rutherford Guys, MD (Ophthalmology) Devra Dopp, MD as Referring  Physician (Dermatology) Alexis Frock, MD as Consulting Physician (Urology) Dorothy Spark, MD as Consulting Physician (Cardiology)   Assessment:    Physical assessment deferred to PCP.  Exercise Activities and Dietary recommendations Current Exercise Habits: The patient does not participate in regular exercise at present, Exercise limited by: cardiac condition(s);orthopedic condition(s);respiratory conditions(s)   Diet (meal preparation, eat out, water intake, caffeinated beverages, dairy products, fruits and vegetables):  Prepares own meals. Drinks water.   Breakfast: cereal, banana, juice Lunch: "grazing", cold cuts, hard boil eggs, fruit Dinner: frozen dinners, baked chicken  Encouraged to continue to eat fresh vegetables and fruit. Dicussed sodium content of processed foods/meats. Patient states he does get low sodium deli meats.  Discussed increasing activity as tolerated. Patient has no interest in increasing activity level at this time.   Goals      Patient Stated   . <enter goal here> (pt-stated)          Would like to maintain current lifestyle and activity level.       Fall Risk Fall Risk  01/15/2016 01/04/2016 10/19/2015 04/10/2015 10/19/2014  Falls in the past year? Yes Yes No No Yes  Number falls in past yr: 1 1 - - 1  Injury with Fall? No No - - No  Risk for fall due to : Impaired balance/gait;Impaired mobility - - - -  Follow up Falls prevention discussed - - - -   Depression Screen PHQ 2/9 Scores 01/15/2016 01/04/2016 10/19/2015 04/10/2015  PHQ - 2 Score 0 0 0 0    Cognitive Function       Ad8 score reviewed for issues:  Issues making decisions: no  Less interest in hobbies / activities:no  Repeats questions, stories (family complaining):no  Trouble using ordinary gadgets (microwave, computer, phone):no  Forgets the month or year: no  Mismanaging finances: no  Remembering appts:no  Daily problems with thinking and/or memory:no Ad8 score  is=0     Immunization History  Administered Date(s) Administered  . H1N1 03/07/2008  . Influenza Split 12/04/2010, 11/10/2011  . Influenza Whole 12/06/2007, 11/29/2008, 11/12/2009  . Influenza, High Dose Seasonal  PF 12/02/2012  . Influenza,inj,Quad PF,36+ Mos 10/26/2013  . Influenza-Unspecified 11/10/2014, 12/04/2015  . Pneumococcal Conjugate-13 04/11/2013  . Pneumococcal Polysaccharide-23 06/03/2006  . Td 03/07/2008  . Zoster 11/14/2009   Screening Tests Health Maintenance  Topic Date Due  . HEMOGLOBIN A1C  10/08/2015  . OPHTHALMOLOGY EXAM  11/30/2015  . FOOT EXAM  04/09/2016  . URINE MICROALBUMIN  04/09/2016  . TETANUS/TDAP  03/07/2018  . INFLUENZA VACCINE  Completed  . ZOSTAVAX  Completed  . PNA vac Low Risk Adult  Completed      Plan:     Eat heart healthy diet (full of fruits, vegetables, whole grains, lean protein, water--limit salt, fat, and sugar intake) and increase physical activity as tolerated.  Start brain stimulating activities (puzzles, reading, adult coloring books, staying active) to keep memory sharp.   Make appointment with eye doctor.   Patient concerns: -bruise/knot on left arm x 2 weeks -"deja vu" type experiences -fall last week  During the course of the visit the patient was educated and counseled about the following appropriate screening and preventive services:   Vaccines to include Pneumoccal, Influenza, Hepatitis B, Td, Zostavax, HCV  Cardiovascular Disease  Colorectal cancer screening  Diabetes screening  Prostate Cancer Screening  Glaucoma screening  Nutrition counseling    Patient Instructions (the written plan) was given to the patient.    Gerilyn Nestle, RN  01/15/2016

## 2016-01-15 ENCOUNTER — Ambulatory Visit (INDEPENDENT_AMBULATORY_CARE_PROVIDER_SITE_OTHER): Payer: Medicare Other | Admitting: Internal Medicine

## 2016-01-15 ENCOUNTER — Encounter: Payer: Self-pay | Admitting: Internal Medicine

## 2016-01-15 VITALS — BP 138/80 | HR 68 | Temp 97.5°F | Ht 68.0 in | Wt 225.0 lb

## 2016-01-15 DIAGNOSIS — E785 Hyperlipidemia, unspecified: Secondary | ICD-10-CM | POA: Diagnosis not present

## 2016-01-15 DIAGNOSIS — I5032 Chronic diastolic (congestive) heart failure: Secondary | ICD-10-CM

## 2016-01-15 DIAGNOSIS — Z0000 Encounter for general adult medical examination without abnormal findings: Secondary | ICD-10-CM

## 2016-01-15 DIAGNOSIS — E1140 Type 2 diabetes mellitus with diabetic neuropathy, unspecified: Secondary | ICD-10-CM

## 2016-01-15 DIAGNOSIS — I10 Essential (primary) hypertension: Secondary | ICD-10-CM | POA: Diagnosis not present

## 2016-01-15 NOTE — Progress Notes (Signed)
Subjective:    Patient ID: Eric Rivers, male    DOB: 04-13-1930, 81 y.o.   MRN: [id number]  HPI  Here to f/u; overall doing ok,  Pt denies chest pain, increasing sob or doe, wheezing, orthopnea, PND, increased LE swelling, palpitations, dizziness or syncope.  Pt denies new neurological symptoms such as new headache, or facial or extremity weakness or numbness.  Pt denies polydipsia, polyuria, or low sugar episode.   Pt denies new neurological symptoms such as new headache, or facial or extremity weakness or numbness.   Pt states overall good compliance with meds, mostly trying to follow appropriate diet, with wt overall up several lbs.  Did have an off balance episode and fall at home after leaned over to pick up a slug insect on the floor at 2am one morning, fell to bottom, no major injury.   No major pain after, did have some ache/discomfort to RLE after.  Next day seen at UC, tx with tylenol qid for 5 days, then resolved after.  Wt up several lbs, but peak wt waws 236 in feb 2017 Wt Readings from Last 3 Encounters:  01/15/16 225 lb (102.1 kg)  01/04/16 219 lb 3.2 oz (99.4 kg)  12/10/15 219 lb 14.4 oz (99.7 kg)  Has been paying attention recently to memory, gets dejavu feelings sometimes, more recently in the past few months.  Denies worsening depressive symptoms, suicidal ideation, or panic; has ongoing anxiety, not increased recently.  Past Medical History:  Diagnosis Date  . ACE-inhibitor cough   . Aortic dissection [facility])    Surgical repair February, 2014  . Aortic stenosis    mild....echo... september... 2010/ mild... echo.[name] KitchenMarland KitchenDec, 2011  . Atrial fibrillation (HCC)     Chronic,   24 hour holter, September, 2011.... atrial fib rate is controlled.... there is some bradycardia but  no marked pauses  . Bladder cancer [facility])    recurrence with TUR-B March '09  . DDD (degenerative disc disease)    lumbar spine  . Diabetes mellitus    type 2  . Diabetes mellitus with neuropathy ([place])  12/04/2006   Qualifier: Diagnosis of  By: Linda Hedges MD, Heinz Knuckles  medications - DPP4   . Diverticulosis of colon with hemorrhage 01/01/2014  . Ejection fraction    EF 60%, echo, 01/2010  . Fluid overload   . Fracture of metacarpal bone 11/30/11  . GERD (gastroesophageal reflux disease)   . Gout   . History of echocardiogram    Echo 5/17 - mild LVH, EF 55-60%, mod AS (mean 16 mmHg, peak 29 mmHg), MAC, midl MR, massive BAE, mod dilated RV, low normal RVSF, mod TR, PASP 64 mmHg  . Hx of colonoscopy    approx. 10 years  . Hypercholesterolemia   . Hyperlipidemia   . Hypertension   . Mitral regurgitation   . Normal nuclear stress test    06/2005 , also ABI normal 2007  . Prostate cancer ([place])    radiation tx.  . Pulmonary hypertension    16mmHg, echo, 01/2010  . Shortness of breath    on exertion  . Warfarin anticoagulation    Atrial fib  . Whooping cough    Past Surgical History:  Procedure Laterality Date  . ASCENDING AORTIC ROOT REPLACEMENT N/A 04/15/2012   Procedure: ASCENDING AORTIC ROOT REPLACEMENT;  Surgeon: Gaye Pollack, MD;  Location: [place];  Service: Open Heart Surgery;  Laterality: N/A;  . bladder cancer biopsy  10/16/2010   negative for malignancy  .  bladder microscopic  04/13/2007   high grade papillary urothelial lesions  . COLONOSCOPY N/A 01/01/2014   Procedure: COLONOSCOPY;  Surgeon: Gatha Mayer, MD;  [facility];  Service: Endoscopy;  Laterality: N/A;  . CYSTOSCOPY/RETROGRADE/URETEROSCOPY Bilateral 04/04/2013   Procedure: CYSTOSCOPY WITH RETROGRADE PYELOGRAM AND BLADDER BIOPSY;  Surgeon: Molli Hazard, MD;  [facility];  Service: Urology;  Laterality: Bilateral;  . 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 EXPLORATION POST OPERATIVE OPEN HEART  04/2012  . Herniatic disc surgery    . LUMBAR LAMINECTOMY     '02  . PROSTATE BIOPSY  09/25/2011   GLEASON 3+3=6 AND 4+3=7  . TEE WITHOUT CARDIOVERSION N/A 04/15/2012     Procedure: TRANSESOPHAGEAL ECHOCARDIOGRAM (TEE);  Surgeon: Gaye Pollack, MD;  Location: [place];  Service: Open Heart Surgery;  Laterality: N/A;  . TRANSURETHRAL RESECTION OF BLADDER TUMOR N/A 06/13/2015   Procedure: TRANSURETHRAL RESECTION OF PROSTATE (TURP) CLOT EVACUATION AND FULGERATION, BLADDER STONE REMOVAL ;  Surgeon: Alexis Frock, MD;  [facility];  Service: Urology;  Laterality: N/A;  . TUR-BT '06, '09      reports that he quit smoking about 40 years ago. His smoking use included Cigarettes. He smoked 1.00 pack per day. He has never used smokeless tobacco. He reports that he does not drink alcohol or use drugs. family history includes Cancer in his father; Heart attack in his mother; Heart disease in his mother; Hypertension in his mother; Prostate cancer (age of onset: 45) in his father; Stroke in his father. Allergies  Allergen Reactions  . Metoprolol Other (See Comments)    Marked bradycardia at 25 mg bid   Current Outpatient Prescriptions on File Prior to Visit  Medication Sig Dispense Refill  . acetaminophen (TYLENOL) 500 MG tablet Take 1,000 mg by mouth 3 (three) times daily as needed for mild pain, moderate pain, fever or headache.     . allopurinol (ZYLOPRIM) 300 MG tablet Take 1 tablet (300 mg total) by mouth daily. 90 tablet 1  . amLODipine (NORVASC) 5 MG tablet Take 1 tablet (5 mg total) by mouth daily. 90 tablet 1  . aspirin EC 325 MG tablet Take 325 mg by mouth daily.    [name] Kitchen atorvastatin (LIPITOR) 20 MG tablet take 1 tablet by mouth once daily 90 tablet 0  . Calcium Carb-Cholecalciferol (CALCIUM 600 + D PO) Take 1 tablet by mouth at bedtime.     [name] Kitchen desonide (DESOWEN) 0.05 % lotion Apply 1 application topically daily as needed (rosacea).     . Ferrous Sulfate (IRON) 325 (65 Fe) MG TABS Take 325 mg by mouth 2 (two) times daily. (Patient taking differently: Take 650 mg by mouth daily. ) 180 each 3  . finasteride (PROSCAR) 5 MG tablet Take 1 tablet (5 mg total) by mouth  every morning. 90 tablet 3  . furosemide (LASIX) 40 MG tablet Take 1 tablet (40 mg total) by mouth daily. 90 tablet 1  . JANUVIA 100 MG tablet take 1 tablet by mouth once daily 90 tablet 2  . ketoconazole (NIZORAL) 2 % shampoo Apply 1 application topically 4 (four) times a week.    . mirabegron ER (MYRBETRIQ) 50 MG TB24 tablet Take 50 mg by mouth daily.    . Multiple Vitamins-Minerals (ONCOVITE PO) Take 1 tablet by mouth daily.     . pantoprazole (PROTONIX) 40 MG tablet Take 1 tablet (40 mg total) by mouth daily. 90 tablet 1  .  potassium chloride (K-DUR,KLOR-CON) 10 MEQ tablet Take 1 tablet (10 mEq total) by mouth daily. 90 tablet 3  . senna (SENOKOT) 8.6 MG tablet Take 1 tablet by mouth at bedtime.     . Tamsulosin HCl (FLOMAX) 0.4 MG CAPS Take 0.4 mg by mouth at bedtime.     . traZODone (DESYREL) 50 MG tablet take 1 tablet by mouth as directed 30 tablet 2  . warfarin (COUMADIN) 4 MG tablet take as directed BY [facility]TICOAGULATION CL[facility] 105 tablet 1   No current facility-administered medications on file prior to visit.     Review of Systems  Constitutional: Negative for unusual diaphoresis or night sweats HENT: Negative for ear swelling or discharge Eyes: Negative for worsening visual haziness  Respiratory: Negative for choking and stridor.   Gastrointestinal: Negative for distension or worsening eructation Genitourinary: Negative for retention or change in urine volume.  Musculoskeletal: Negative for other MSK pain or swelling Skin: Negative for color change and worsening wound Neurological: Negative for tremors and numbness other than noted  Psychiatric/Behavioral: Negative for decreased concentration or agitation other than above   All other system neg per pt    Objective:   Physical Exam BP 138/80 (BP Location: Right Arm, Patient Position: Sitting, Cuff Size: Normal)   Pulse 68   Temp 97.5 F (36.4 C) (Oral)   Ht 5\' 8"  (1.727 m)   Wt 225 lb (102.1 kg)   SpO2 96%   BMI 34.21  kg/m  VS noted,  Constitutional: Pt appears in no apparent distress HENT: Head: NCAT.  Right Ear: External ear normal.  Left Ear: External ear normal.  Eyes: . Pupils are equal, round, and reactive to light. Conjunctivae and EOM are normal Neck: Normal range of motion. Neck supple.  Cardiovascular: Normal rate and regular rhythm.   Pulmonary/Chest: Effort normal and breath sounds without rales or wheezing.  Abd:  Soft, NT, ND, + BS Neurological: Pt is alert. Not confused , motor grossly intact Skin: Skin is warm. No rash, no LE edema Psychiatric: Pt behavior is normal. No agitation.  No other new exam findings    POCT INR  Order: [id number]  Status:  Final result Visible to patient:  No (Not Released) Next appt:  01/17/2016 at 10:15 AM in Cardiology Ena Dawley, MD)   1d ago  INR 2.1         Assessment & Plan:

## 2016-01-15 NOTE — Progress Notes (Signed)
I agree with this plan.

## 2016-01-15 NOTE — Patient Instructions (Addendum)
Please continue all other medications as before, and refills have been done if requested.  Please have the pharmacy call with any other refills you may need.  Please continue your efforts at being more active, low cholesterol diet, and weight control.  You are otherwise up to date with prevention measures today.  Please keep your appointments with your specialists as you may have planned  Please return in 6 months, or sooner if needed, with Lab testing done 3-5 days before   Eat heart healthy diet (full of fruits, vegetables, whole grains, lean protein, water--limit salt, fat, and sugar intake) and increase physical activity as tolerated.  Start brain stimulating activities (puzzles, reading, adult coloring books, staying active) to keep memory sharp.   Make appointment with eye doctor.    Fall Prevention in the Home Introduction Falls can cause injuries. They can happen to people of all ages. There are many things you can do to make your home safe and to help prevent falls. What can I do on the outside of my home?  Regularly fix the edges of walkways and driveways and fix any cracks.  Remove anything that might make you trip as you walk through a door, such as a raised step or threshold.  Trim any bushes or trees on the path to your home.  Use bright outdoor lighting.  Clear any walking paths of anything that might make someone trip, such as rocks or tools.  Regularly check to see if handrails are loose or broken. Make sure that both sides of any steps have handrails.  Any raised decks and porches should have guardrails on the edges.  Have any leaves, snow, or ice cleared regularly.  Use sand or salt on walking paths during winter.  Clean up any spills in your garage right away. This includes oil or grease spills. What can I do in the bathroom?  Use night lights.  Install grab bars by the toilet and in the tub and shower. Do not use towel bars as grab bars.  Use  non-skid mats or decals in the tub or shower.  If you need to sit down in the shower, use a plastic, non-slip stool.  Keep the floor dry. Clean up any water that spills on the floor as soon as it happens.  Remove soap buildup in the tub or shower regularly.  Attach bath mats securely with double-sided non-slip rug tape.  Do not have throw rugs and other things on the floor that can make you trip. What can I do in the bedroom?  Use night lights.  Make sure that you have a light by your bed that is easy to reach.  Do not use any sheets or blankets that are too big for your bed. They should not hang down onto the floor.  Have a firm chair that has side arms. You can use this for support while you get dressed.  Do not have throw rugs and other things on the floor that can make you trip. What can I do in the kitchen?  Clean up any spills right away.  Avoid walking on wet floors.  Keep items that you use a lot in easy-to-reach places.  If you need to reach something above you, use a strong step stool that has a grab bar.  Keep electrical cords out of the way.  Do not use floor polish or wax that makes floors slippery. If you must use wax, use non-skid floor wax.  Do not have  throw rugs and other things on the floor that can make you trip. What can I do with my stairs?  Do not leave any items on the stairs.  Make sure that there are handrails on both sides of the stairs and use them. Fix handrails that are broken or loose. Make sure that handrails are as long as the stairways.  Check any carpeting to make sure that it is firmly attached to the stairs. Fix any carpet that is loose or worn.  Avoid having throw rugs at the top or bottom of the stairs. If you do have throw rugs, attach them to the floor with carpet tape.  Make sure that you have a light switch at the top of the stairs and the bottom of the stairs. If you do not have them, ask someone to add them for you. What  else can I do to help prevent falls?  Wear shoes that:  Do not have high heels.  Have rubber bottoms.  Are comfortable and fit you well.  Are closed at the toe. Do not wear sandals.  If you use a stepladder:  Make sure that it is fully opened. Do not climb a closed stepladder.  Make sure that both sides of the stepladder are locked into place.  Ask someone to hold it for you, if possible.  Clearly mark and make sure that you can see:  Any grab bars or handrails.  First and last steps.  Where the edge of each step is.  Use tools that help you move around (mobility aids) if they are needed. These include:  Canes.  Walkers.  Scooters.  Crutches.  Turn on the lights when you go into a dark area. Replace any light bulbs as soon as they burn out.  Set up your furniture so you have a clear path. Avoid moving your furniture around.  If any of your floors are uneven, fix them.  If there are any pets around you, be aware of where they are.  Review your medicines with your doctor. Some medicines can make you feel dizzy. This can increase your chance of falling. Ask your doctor what other things that you can do to help prevent falls. This information is not intended to replace advice given to you by your health care provider. Make sure you discuss any questions you have with your health care provider. Document Released: 12/14/2008 Document Revised: 07/26/2015 Document Reviewed: 03/24/2014  2017 [employer]  Health Maintenance, Male A healthy lifestyle and preventative care can promote health and wellness.  Maintain regular health, dental, and eye exams.  Eat a healthy diet. Foods like vegetables, fruits, whole grains, low-fat dairy products, and lean protein foods contain the nutrients you need and are low in calories. Decrease your intake of foods high in solid fats, added sugars, and salt. Get information about a proper diet from your health care provider, if  necessary.  Regular physical exercise is one of the most important things you can do for your health. Most adults should get at least 150 minutes of moderate-intensity exercise (any activity that increases your heart rate and causes you to sweat) each week. In addition, most adults need muscle-strengthening exercises on 2 or more days a week.   Maintain a healthy weight. The body mass index (BMI) is a screening tool to identify possible weight problems. It provides an estimate of body fat based on height and weight. Your health care provider can find your BMI and can help you achieve  or maintain a healthy weight. For males 20 years and older:  A BMI below 18.5 is considered underweight.  A BMI of 18.5 to 24.9 is normal.  A BMI of 25 to 29.9 is considered overweight.  A BMI of 30 and above is considered obese.  Maintain normal blood lipids and cholesterol by exercising and minimizing your intake of saturated fat. Eat a balanced diet with plenty of fruits and vegetables. Blood tests for lipids and cholesterol should begin at age 25 and be repeated every 5 years. If your lipid or cholesterol levels are high, you are over age 61, or you are at high risk for heart disease, you may need your cholesterol levels checked more frequently.Ongoing high lipid and cholesterol levels should be treated with medicines if diet and exercise are not working.  If you smoke, find out from your health care provider how to quit. If you do not use tobacco, do not start.  Lung cancer screening is recommended for adults aged 85-80 years who are at high risk for developing lung cancer because of a history of smoking. A yearly low-dose CT scan of the lungs is recommended for people who have at least a 30-pack-year history of smoking and are current smokers or have quit within the past 15 years. A pack year of smoking is smoking an average of 1 pack of cigarettes a day for 1 year (for example, a 30-pack-year history of  smoking could mean smoking 1 pack a day for 30 years or 2 packs a day for 15 years). Yearly screening should continue until the smoker has stopped smoking for at least 15 years. Yearly screening should be stopped for people who develop a health problem that would prevent them from having lung cancer treatment.  If you choose to drink alcohol, do not have more than 2 drinks per day. One drink is considered to be 12 oz (360 mL) of beer, 5 oz (150 mL) of wine, or 1.5 oz (45 mL) of liquor.  Avoid the use of street drugs. Do not share needles with anyone. Ask for help if you need support or instructions about stopping the use of drugs.  High blood pressure causes heart disease and increases the risk of stroke. High blood pressure is more likely to develop in:  People who have blood pressure in the end of the normal range (100-139/85-89 mm Hg).  People who are overweight or obese.  People who are African American.  If you are 36-20 years of age, have your blood pressure checked every 3-5 years. If you are 92 years of age or older, have your blood pressure checked every year. You should have your blood pressure measured twice-once when you are at a hospital or clinic, and once when you are not at a hospital or clinic. Record the average of the two measurements. To check your blood pressure when you are not at a hospital or clinic, you can use:  An automated blood pressure machine at a pharmacy.  A home blood pressure monitor.  If you are 74-47 years old, ask your health care provider if you should take aspirin to prevent heart disease.  Diabetes screening involves taking a blood sample to check your fasting blood sugar level. This should be done once every 3 years after age 60 if you are at a normal weight and without risk factors for diabetes. Testing should be considered at a younger age or be carried out more frequently if you are overweight and have  at least 1 risk factor for  diabetes.  Colorectal cancer can be detected and often prevented. Most routine colorectal cancer screening begins at the age of 77 and continues through age 63. However, your health care provider may recommend screening at an earlier age if you have risk factors for colon cancer. On a yearly basis, your health care provider may provide home test kits to check for hidden blood in the stool. A small camera at the end of a tube may be used to directly examine the colon (sigmoidoscopy or colonoscopy) to detect the earliest forms of colorectal cancer. Talk to your health care provider about this at age 53 when routine screening begins. A direct exam of the colon should be repeated every 5-10 years through age 38, unless early forms of precancerous polyps or small growths are found.  People who are at an increased risk for hepatitis B should be screened for this virus. You are considered at high risk for hepatitis B if:  You were born in a country where hepatitis B occurs often. Talk with your health care provider about which countries are considered high risk.  Your parents were born in a high-risk country and you have not received a shot to protect against hepatitis B (hepatitis B vaccine).  You have HIV or AIDS.  You use needles to inject street drugs.  You live with, or have sex with, someone who has hepatitis B.  You are a man who has sex with other men (MSM).  You get hemodialysis treatment.  You take certain medicines for conditions like cancer, organ transplantation, and autoimmune conditions.  Hepatitis C blood testing is recommended for all people born from [birth year] through 1963 and any individual with known risk factors for hepatitis C.  Healthy men should no longer receive prostate-specific antigen (PSA) blood tests as part of routine cancer screening. Talk to your health care provider about prostate cancer screening.  Testicular cancer screening is not recommended for adolescents or  adult males who have no symptoms. Screening includes self-exam, a health care provider exam, and other screening tests. Consult with your health care provider about any symptoms you have or any concerns you have about testicular cancer.  Practice safe sex. Use condoms and avoid high-risk sexual practices to reduce the spread of sexually transmitted infections (STIs).  You should be screened for STIs, including gonorrhea and chlamydia if:  You are sexually active and are younger than 24 years.  You are older than 24 years, and your health care provider tells you that you are at risk for this type of infection.  Your sexual activity has changed since you were last screened, and you are at an increased risk for chlamydia or gonorrhea. Ask your health care provider if you are at risk.  If you are at risk of being infected with HIV, it is recommended that you take a prescription medicine daily to prevent HIV infection. This is called pre-exposure prophylaxis (PrEP). You are considered at risk if:  You are a man who has sex with other men (MSM).  You are a heterosexual man who is sexually active with multiple partners.  You take drugs by injection.  You are sexually active with a partner who has HIV.  Talk with your health care provider about whether you are at high risk of being infected with HIV. If you choose to begin PrEP, you should first be tested for HIV. You should then be tested every 3 months for as long as  you are taking PrEP.  Use sunscreen. Apply sunscreen liberally and repeatedly throughout the day. You should seek shade when your shadow is shorter than you. Protect yourself by wearing long sleeves, pants, a wide-brimmed hat, and sunglasses year round whenever you are outdoors.  Tell your health care provider of new moles or changes in moles, especially if there is a change in shape or color. Also, tell your health care provider if a mole is larger than the size of a pencil  eraser.  A one-time screening for abdominal aortic aneurysm (AAA) and surgical repair of large AAAs by ultrasound is recommended for men aged 58-75 years who are current or former smokers.  Stay current with your vaccines (immunizations). This information is not intended to replace advice given to you by your health care provider. Make sure you discuss any questions you have with your health care provider. Document Released: 08/16/2007 Document Revised: 03/10/2014 Document Reviewed: 11/21/2014 [employer] Interactive Patient Education  2017 [employer].

## 2016-01-17 ENCOUNTER — Ambulatory Visit (INDEPENDENT_AMBULATORY_CARE_PROVIDER_SITE_OTHER): Payer: Medicare Other | Admitting: Cardiology

## 2016-01-17 VITALS — BP 136/74 | HR 67 | Ht 68.0 in | Wt 222.4 lb

## 2016-01-17 DIAGNOSIS — I482 Chronic atrial fibrillation, unspecified: Secondary | ICD-10-CM

## 2016-01-17 DIAGNOSIS — Z7901 Long term (current) use of anticoagulants: Secondary | ICD-10-CM

## 2016-01-17 DIAGNOSIS — L603 Nail dystrophy: Secondary | ICD-10-CM | POA: Diagnosis not present

## 2016-01-17 DIAGNOSIS — E1151 Type 2 diabetes mellitus with diabetic peripheral angiopathy without gangrene: Secondary | ICD-10-CM | POA: Diagnosis not present

## 2016-01-17 DIAGNOSIS — I10 Essential (primary) hypertension: Secondary | ICD-10-CM | POA: Diagnosis not present

## 2016-01-17 DIAGNOSIS — I5032 Chronic diastolic (congestive) heart failure: Secondary | ICD-10-CM | POA: Diagnosis not present

## 2016-01-17 DIAGNOSIS — I5033 Acute on chronic diastolic (congestive) heart failure: Secondary | ICD-10-CM | POA: Diagnosis not present

## 2016-01-17 DIAGNOSIS — I739 Peripheral vascular disease, unspecified: Secondary | ICD-10-CM | POA: Diagnosis not present

## 2016-01-17 DIAGNOSIS — I350 Nonrheumatic aortic (valve) stenosis: Secondary | ICD-10-CM

## 2016-01-17 NOTE — Progress Notes (Signed)
Cardiology Office Note    Date:  01/18/2016   ID:  [name], [name] [birth year], MRN [id number]  PCP:  Cathlean Cower, MD  Cardiologist: Dr.Shynia Daleo  Chief Complaint  Patient presents with  . Follow-up    History of Present Illness:  Eric Rivers is a 82 y.o. male  with a history of known diastolic HF, chronic AF, status post emergent surgery for ascending aortic dissection back in 2014 with reimplantation of his coronary arteries. He has chronic stable exertional shortness of breath. Other issues include aortic stenosis, chronic anemia, bladder cancer, DM, and GERD. Other issues as noted below.   When seen by Dr. Meda Coffee at the end of July with fluid overload and Lasix was increased. Hemoglobin was 8.7 and was started on iron. Seems to be chronic. Remained on Coumadin. Saw Truitt Merle back 09/26/15 and was doing well. She decreased his Lasix back to 40 mg daily. Referral made to hematology.  Patient has since decreased his Lasix to 20 mg daily because he doesn't like running to the bathroom all the time. He also enjoys salt.   01/17/2016 - 2 months follow-up, the patient states that he's been feeling well he has been more careful with salt intake, however he has not been compliant with his Lasix, doesn't take it on days when he goes to doctor's appointment at any other activities in his community, today he has worsening lower extremity edema as he didn't take his Lasix yesterday night or today and he is not planning to take it tomorrow as he has another doctor's appointment. He denies any orthopnea or personal nocturnal dyspnea. He denies chest pain and has stable dyspnea on exertion.  Past Medical History:  Diagnosis Date  . ACE-inhibitor cough   . Aortic dissection [facility])    Surgical repair February, 2014  . Aortic stenosis    mild....echo... september... 2010/ mild... echo.[name] KitchenMarland KitchenDec, 2011  . Atrial fibrillation (HCC)     Chronic,   24 hour holter, September, 2011.... atrial fib  rate is controlled.... there is some bradycardia but  no marked pauses  . Bladder cancer [facility])    recurrence with TUR-B March '09  . DDD (degenerative disc disease)    lumbar spine  . Diabetes mellitus    type 2  . Diabetes mellitus with neuropathy ([place]) 12/04/2006   Qualifier: Diagnosis of  By: Linda Hedges MD, Heinz Knuckles  medications - DPP4   . Diverticulosis of colon with hemorrhage 01/01/2014  . Ejection fraction    EF 60%, echo, 01/2010  . Fluid overload   . Fracture of metacarpal bone 11/30/11  . GERD (gastroesophageal reflux disease)   . Gout   . History of echocardiogram    Echo 5/17 - mild LVH, EF 55-60%, mod AS (mean 16 mmHg, peak 29 mmHg), MAC, midl MR, massive BAE, mod dilated RV, low normal RVSF, mod TR, PASP 64 mmHg  . Hx of colonoscopy    approx. 10 years  . Hypercholesterolemia   . Hyperlipidemia   . Hypertension   . Mitral regurgitation   . Normal nuclear stress test    06/2005 , also ABI normal 2007  . Prostate cancer ([place])    radiation tx.  . Pulmonary hypertension    37mmHg, echo, 01/2010  . Shortness of breath    on exertion  . Warfarin anticoagulation    Atrial fib  . Whooping cough     Past Surgical History:  Procedure Laterality Date  . ASCENDING AORTIC ROOT REPLACEMENT  N/A 04/15/2012   Procedure: ASCENDING AORTIC ROOT REPLACEMENT;  Surgeon: Gaye Pollack, MD;  Location: [facility];  Service: Open Heart Surgery;  Laterality: N/A;  . bladder cancer biopsy  10/16/2010   negative for malignancy  . bladder microscopic  04/13/2007   high grade papillary urothelial lesions  . COLONOSCOPY N/A 01/01/2014   Procedure: COLONOSCOPY;  Surgeon: Gatha Mayer, MD;  Location: [facility];  Service: Endoscopy;  Laterality: N/A;  . CYSTOSCOPY/RETROGRADE/URETEROSCOPY Bilateral 04/04/2013   Procedure: CYSTOSCOPY WITH RETROGRADE PYELOGRAM AND BLADDER BIOPSY;  Surgeon: Molli Hazard, MD;  Location: [facility];  Service: Urology;  Laterality: Bilateral;  . 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 EXPLORATION POST OPERATIVE OPEN HEART  04/2012  . Herniatic disc surgery    . LUMBAR LAMINECTOMY     '02  . PROSTATE BIOPSY  09/25/2011   GLEASON 3+3=6 AND 4+3=7  . TEE WITHOUT CARDIOVERSION N/A 04/15/2012   Procedure: TRANSESOPHAGEAL ECHOCARDIOGRAM (TEE);  Surgeon: Gaye Pollack, MD;  Location: [place];  Service: Open Heart Surgery;  Laterality: N/A;  . TRANSURETHRAL RESECTION OF BLADDER TUMOR N/A 06/13/2015   Procedure: TRANSURETHRAL RESECTION OF PROSTATE (TURP) CLOT EVACUATION AND FULGERATION, BLADDER STONE REMOVAL ;  Surgeon: Alexis Frock, MD;  Location: [facility];  Service: Urology;  Laterality: N/A;  . TUR-BT '06, '09      Current Medications: Outpatient Medications Prior to Visit  Medication Sig Dispense Refill  . acetaminophen (TYLENOL) 500 MG tablet Take 1,000 mg by mouth 3 (three) times daily as needed for mild pain, moderate pain, fever or headache.     . allopurinol (ZYLOPRIM) 300 MG tablet Take 1 tablet (300 mg total) by mouth daily. 90 tablet 1  . amLODipine (NORVASC) 5 MG tablet Take 1 tablet (5 mg total) by mouth daily. 90 tablet 1  . aspirin EC 325 MG tablet Take 325 mg by mouth daily.    [name] Kitchen atorvastatin (LIPITOR) 20 MG tablet take 1 tablet by mouth once daily 90 tablet 0  . Calcium Carb-Cholecalciferol (CALCIUM 600 + D PO) Take 1 tablet by mouth at bedtime.     [name] Kitchen desonide (DESOWEN) 0.05 % lotion Apply 1 application topically daily as needed (rosacea).     . Ferrous Sulfate (IRON) 325 (65 Fe) MG TABS Take 325 mg by mouth 2 (two) times daily. (Patient taking differently: Take 650 mg by mouth daily. ) 180 each 3  . finasteride (PROSCAR) 5 MG tablet Take 1 tablet (5 mg total) by mouth every morning. 90 tablet 3  . furosemide (LASIX) 40 MG tablet Take 1 tablet (40 mg total) by mouth daily. 90 tablet 1  . ketoconazole (NIZORAL) 2 % shampoo Apply 1 application topically 4 (four) times a week.    . mirabegron ER (MYRBETRIQ) 50 MG  TB24 tablet Take 50 mg by mouth daily.    . Multiple Vitamins-Minerals (ONCOVITE PO) Take 1 tablet by mouth daily.     . pantoprazole (PROTONIX) 40 MG tablet Take 1 tablet (40 mg total) by mouth daily. 90 tablet 1  . potassium chloride (K-DUR,KLOR-CON) 10 MEQ tablet Take 1 tablet (10 mEq total) by mouth daily. 90 tablet 3  . senna (SENOKOT) 8.6 MG tablet Take 1 tablet by mouth at bedtime.     . Tamsulosin HCl (FLOMAX) 0.4 MG CAPS Take 0.4 mg by mouth at bedtime.     . traZODone (DESYREL) 50 MG tablet take 1 tablet by mouth as directed 30  tablet 2  . warfarin (COUMADIN) 4 MG tablet take as directed BY ANTICOAGULATION CLINIC 105 tablet 1  . JANUVIA 100 MG tablet take 1 tablet by mouth once daily 90 tablet 2   No facility-administered medications prior to visit.      Allergies:   Metoprolol   Social History   Social History  . Marital status: Single    Spouse name: N/A  . Number of children: 0  . Years of education: 54   Occupational History  . history and Advice worker Retired    retired   Social History Main Topics  . Smoking status: Former Smoker    Packs/day: 1.00    Types: Cigarettes    Quit date: 03/04/1975  . Smokeless tobacco: Never Used  . Alcohol use No  . Drug use: No  . Sexual activity: Not Currently   Other Topics Concern  . Not on file   Social History Narrative   Confirmed batchelor. Retired Education officer, museum. World traveler- Ambulance person. I- ADLS. End of life Care   No CPR, no prolonged intubation, no prolonged artificial hydration or feeding, no heroic or futile measures.       Next [place] Sept [place]13 [place]      Last cruise - two cruises back to back to the [place] in January '15. May'15: From [place] to [place] with stops in [place], [place], [place], [place], [place]. 15 days      Fall '15: 50 Days from [place]. [place], [place], [place], [place], [place], [place], [place], [employer].       May '15 - 15 days up the [employer] as far as [place]             Family History:  The patient's family history includes Cancer in his father; Heart attack in his mother; Heart disease in his mother; Hypertension in his mother; Prostate cancer (age of onset: 23) in his father; Stroke in his father.   ROS:   Please see the history of present illness.    Review of Systems  Constitution: Negative.  HENT: Negative.   Cardiovascular: Positive for dyspnea on exertion, irregular heartbeat and leg swelling.  Respiratory: Positive for shortness of breath.   Endocrine: Negative.   Hematologic/Lymphatic: Negative.   Musculoskeletal: Negative.        Broken toe on his right foot in a boot  Gastrointestinal: Negative.   Genitourinary: Negative.   Neurological: Positive for loss of balance.   All other systems reviewed and are negative.   PHYSICAL EXAM:   VS:  BP 136/74 (BP Location: Right Arm, Patient Position: Sitting, Cuff Size: Large)   Pulse 67   Ht 5\' 8"  (1.727 m)   Wt 222 lb 6.4 oz (100.9 kg)   SpO2 91%   BMI 33.82 kg/m   Physical Exam  GEN: Obese, in no acute distress  Neck: no JVD, carotid bruits, or masses Cardiac: Irregular irregular with 2/6 harsh systolic murmur at the left sternal border, no rubs, or gallops  Respiratory:  Decreased breath sounds but clear to auscultation bilaterally, normal work of breathing GI: soft, nontender, nondistended, + BS Ext: 2+ lower extremity edema bilaterally with some erythema, right foot in boot, decreased distal pulses  MS: no deformity or atrophy  Skin: warm and dry, no rash Psych: euthymic mood, full affect  Wt Readings from Last 3 Encounters:  01/17/16 222 lb 6.4 oz (100.9 kg)  01/15/16  225 lb (102.1 kg)  01/04/16 219 lb 3.2 oz (99.4 kg)      Studies/Labs Reviewed:   EKG:  EKG is not ordered today.   Recent Labs: 04/10/2015: TSH 1.48 09/07/2015: Pro B Natriuretic peptide (BNP) 566.0 09/26/2015: Brain Natriuretic  Peptide 334.0 12/10/2015: ALT 13; BUN 12.0; Creatinine 0.9; HGB 11.1; Platelets 166; Potassium 4.3; Sodium 141   Lipid Panel    Component Value Date/Time   CHOL 102 04/10/2015 1230   TRIG 57.0 04/10/2015 1230   HDL 45.60 04/10/2015 1230   CHOLHDL 2 04/10/2015 1230   VLDL 11.4 04/10/2015 1230   LDLCALC 45 04/10/2015 1230    Additional studies/ records that were reviewed today include:   2-D echo 07/03/15 Study Conclusions   - Left ventricle: The cavity size was normal. Wall thickness was   increased in a pattern of mild LVH. Systolic function was normal.   The estimated ejection fraction was in the range of 55% to 60%.   The study is not technically sufficient to allow evaluation of LV   diastolic function. - Aortic valve: Mildly calcified leaflets. Moderate stenosis. Mean   gradient (S): 16 mm Hg. Peak gradient (S): 29 mm Hg. Valve area   (VTI): 1.09 cm^2. Valve area (Vmax): 1.2 cm^2. - Mitral valve: Calcified annulus. Mildly thickened leaflets .   There was mild regurgitation. - Left atrium: Massively dilated at 71 ml/m2. - Right ventricle: The cavity size was moderately dilated. Systolic   function is low normal. - Right atrium: Massively dilated at 50 cm2. - Tricuspid valve: There was moderate regurgitation. - Pulmonary arteries: PA peak pressure: 64 mm Hg (S). - Inferior vena cava: The vessel was dilated. The respirophasic   diameter changes were blunted (< 50%), consistent with elevated   central venous pressure.   Impressions:   - Compared to a prior echo in 2015, there is now moderate aortic   stenosis with AVA around 1.1 cm2.  Chest CTA 9/16 IMPRESSION: 1. Stable appearance of ascending aortic repair, and persistent descending and abdominal aortic dissection flap as detailed above. 2. 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 3. Marked biatrial enlargement. Thrombus in the left atrial appendage is more conspicuous than on prior exam. 4. Extension of  dissection flap through the main right renal artery and into the proximal left common iliac artery, stable compared to prior exam. 5. Cholelithiasis 6. Bladder calculus.   EKG performed today 01/17/2016 shows atrial fibrillation, right axis deviation, anterior infarct age undetermined, unchanged from prior.  ASSESSMENT:    1. Chronic atrial fibrillation ([place])   2. Essential hypertension   3. Acute on chronic diastolic heart failure ([place])   4. Chronic diastolic CHF (congestive heart failure) ([place])   5. Nonrheumatic aortic valve stenosis   6. Chronic anticoagulation     PLAN:  In order of problems listed above:  Acute on chronic diastolic heart failure: The patient has worsening lower extremity edema however this is secondary to noncompliance with his Lasix, I wanted to increase his Lasix to 40 twice a day for few days however he really doesn't want to do that as it is inconvenient for him to go to bathroom a lot.  It is reinforced and date these important to take Lasix once a day, will follow up in 4 weeks.   Moderate aortic stenosis on echo in 07/2015 continue to monitor, he clearly has S2 no need to repeat echo right now.  Chronic atrial fibrillation: Rate controlled not on  any rate lowering medications. On Coumadin. No bleeding.  Essential hypertension blood pressure is controlled on current regimen.   Medication Adjustments/Labs and Tests Ordered: Current medicines are reviewed at length with the patient today.  Concerns regarding medicines are outlined above.  Medication changes, Labs and Tests ordered today are listed in the Patient Instructions below. Patient Instructions  Medication Instructions:   Your physician recommends that you continue on your current medications as directed. Please refer to the Current Medication list given to you today.    Follow-Up:  4 WEEKS WITH AN EXTENDER IN OUR OFFICE       If you need a refill on your cardiac medications before your  next appointment, please call your pharmacy.      Signed, Ena Dawley, MD  01/18/2016 1:07 PM    [facility] [address], [address], [place]  [address] Phone: [phone]; Fax: [phone]

## 2016-01-17 NOTE — Patient Instructions (Signed)
Medication Instructions:   Your physician recommends that you continue on your current medications as directed. Please refer to the Current Medication list given to you today.    Follow-Up:  4 WEEKS WITH AN EXTENDER IN OUR OFFICE       If you need a refill on your cardiac medications before your next appointment, please call your pharmacy.

## 2016-01-20 NOTE — Assessment & Plan Note (Signed)
stable overall by history and exam, recent data reviewed with pt, and pt to continue medical treatment as before,  to f/u any worsening symptoms or concerns BP Readings from Last 3 Encounters:  01/17/16 136/74  01/15/16 138/80  01/04/16 132/80

## 2016-01-20 NOTE — Assessment & Plan Note (Signed)
stable overall by history and exam, recent data reviewed with pt, and pt to continue medical treatment as before,  to f/u any worsening symptoms or concerns Lab Results  Component Value Date   LDLCALC 45 04/10/2015

## 2016-01-20 NOTE — Assessment & Plan Note (Signed)
stable overall by history and exam, recent data reviewed with pt, and pt to continue medical treatment as before,  to f/u any worsening symptoms or concerns Lab Results  Component Value Date   HGBA1C 6.6 (H) 04/10/2015

## 2016-01-20 NOTE — Assessment & Plan Note (Signed)
stable overall by history and exam, recent data reviewed with pt, and pt to continue medical treatment as before,  to f/u any worsening symptoms or concerns Lab Results  Component Value Date   WBC 8.1 12/10/2015   HGB 11.1 (L) 12/10/2015   HCT 35.2 (L) 12/10/2015   PLT 166 12/10/2015   GLUCOSE 139 12/10/2015   CHOL 102 04/10/2015   TRIG 57.0 04/10/2015   HDL 45.60 04/10/2015   LDLCALC 45 04/10/2015   ALT 13 12/10/2015   AST 20 12/10/2015   NA 141 12/10/2015   K 4.3 12/10/2015   CL 97 (L) 09/26/2015   CREATININE 0.9 12/10/2015   BUN 12.0 12/10/2015   CO2 27 12/10/2015   TSH 1.48 04/10/2015   PSA 2.19 12/06/2007   INR 2.1 01/14/2016   HGBA1C 6.6 (H) 04/10/2015   MICROALBUR 4.1 (H) 04/10/2015

## 2016-01-21 DIAGNOSIS — H5213 Myopia, bilateral: Secondary | ICD-10-CM | POA: Diagnosis not present

## 2016-01-21 DIAGNOSIS — H2513 Age-related nuclear cataract, bilateral: Secondary | ICD-10-CM | POA: Diagnosis not present

## 2016-01-21 DIAGNOSIS — H524 Presbyopia: Secondary | ICD-10-CM | POA: Diagnosis not present

## 2016-01-21 DIAGNOSIS — H52203 Unspecified astigmatism, bilateral: Secondary | ICD-10-CM | POA: Diagnosis not present

## 2016-01-21 DIAGNOSIS — E119 Type 2 diabetes mellitus without complications: Secondary | ICD-10-CM | POA: Diagnosis not present

## 2016-01-28 ENCOUNTER — Telehealth: Payer: Self-pay | Admitting: *Deleted

## 2016-01-28 DIAGNOSIS — R310 Gross hematuria: Secondary | ICD-10-CM | POA: Diagnosis not present

## 2016-01-28 NOTE — Telephone Encounter (Signed)
Pt left msg on triage stating saw MD couple weeks ago inform would like rx change to 90 day instead of 30, but pharmacy never receive updated script...Eric Rivers

## 2016-01-29 NOTE — Telephone Encounter (Signed)
Pt is on multiple meds  Please ask pt to clarify any specific request

## 2016-01-29 NOTE — Telephone Encounter (Signed)
Pt called in about this again  °

## 2016-01-29 NOTE — Telephone Encounter (Signed)
Called patient to see which medication he was needing unable to reach left message to give [place] a call back.

## 2016-01-30 MED ORDER — TRAZODONE HCL 50 MG PO TABS: 50.0000 mg | ORAL_TABLET | ORAL | 1 refills | Status: DC

## 2016-01-30 NOTE — Telephone Encounter (Signed)
Pt states he asked for his Trazodone to be sent in for 90 day supply...Eric Rivers

## 2016-01-30 NOTE — Addendum Note (Signed)
Addended by: Biagio Borg on: 01/30/2016 12:38 PM   Modules accepted: Orders

## 2016-01-30 NOTE — Telephone Encounter (Signed)
Trazodone done erx 

## 2016-01-30 NOTE — Telephone Encounter (Signed)
Notified pt rx has been sent.[name] KitchenMarland KitchenLMB

## 2016-02-11 ENCOUNTER — Other Ambulatory Visit: Payer: Self-pay | Admitting: Internal Medicine

## 2016-02-11 ENCOUNTER — Ambulatory Visit (INDEPENDENT_AMBULATORY_CARE_PROVIDER_SITE_OTHER): Payer: Medicare Other | Admitting: General Practice

## 2016-02-11 DIAGNOSIS — Z7901 Long term (current) use of anticoagulants: Secondary | ICD-10-CM | POA: Diagnosis not present

## 2016-02-11 DIAGNOSIS — I272 Pulmonary hypertension, unspecified: Secondary | ICD-10-CM

## 2016-02-11 DIAGNOSIS — Z5181 Encounter for therapeutic drug level monitoring: Secondary | ICD-10-CM

## 2016-02-11 DIAGNOSIS — I482 Chronic atrial fibrillation, unspecified: Secondary | ICD-10-CM

## 2016-02-11 LAB — POCT INR: INR: 2.2

## 2016-02-11 NOTE — Progress Notes (Signed)
I agree with this plan.

## 2016-02-11 NOTE — Patient Instructions (Signed)
Pre visit review using our clinic review tool, if applicable. No additional management support is needed unless otherwise documented below in the visit note. 

## 2016-02-13 ENCOUNTER — Encounter: Payer: Self-pay | Admitting: Cardiology

## 2016-02-14 ENCOUNTER — Ambulatory Visit: Payer: Medicare Other | Admitting: Cardiology

## 2016-02-19 ENCOUNTER — Encounter: Payer: Self-pay | Admitting: Cardiology

## 2016-02-19 ENCOUNTER — Ambulatory Visit (INDEPENDENT_AMBULATORY_CARE_PROVIDER_SITE_OTHER): Payer: Medicare Other | Admitting: Cardiology

## 2016-02-19 ENCOUNTER — Encounter (INDEPENDENT_AMBULATORY_CARE_PROVIDER_SITE_OTHER): Payer: Self-pay

## 2016-02-19 VITALS — BP 146/78 | HR 81 | Ht 68.0 in | Wt 220.0 lb

## 2016-02-19 DIAGNOSIS — I482 Chronic atrial fibrillation, unspecified: Secondary | ICD-10-CM

## 2016-02-19 DIAGNOSIS — I5032 Chronic diastolic (congestive) heart failure: Secondary | ICD-10-CM

## 2016-02-19 NOTE — Patient Instructions (Signed)
Medication Instructions:  Your physician recommends that you continue on your current medications as directed. Please refer to the Current Medication list given to you today.   Labwork: BMET today  Testing/Procedures: None   Follow-Up: Your physician recommends that you schedule a follow-up appointment in: 3 months with Dr Meda Coffee.   Any Other Special Instructions Will Be Listed Below (If Applicable).  Your physician recommends that you weigh, daily, at the same time every day, and in the same amount of clothing. Please record your daily weights.   Call our office if you gain 3 pounds in 24 hours or 5 pounds in 1 week.  Limit salt or sodium in your diet.   Low-Sodium Eating Plan Sodium raises blood pressure and causes water to be held in the body. Getting less sodium from food will help lower your blood pressure, reduce any swelling, and protect your heart, liver, and kidneys. We get sodium by adding salt (sodium chloride) to food. Most of our sodium comes from canned, boxed, and frozen foods. Restaurant foods, fast foods, and pizza are also very high in sodium. Even if you take medicine to lower your blood pressure or to reduce fluid in your body, getting less sodium from your food is important.  What do I need to know about this eating plan? For the low-sodium eating plan, you will follow these general guidelines:  Choose foods with a % Daily Value for sodium of less than 5% (as listed on the food label).  Use salt-free seasonings or herbs instead of table salt or sea salt.  Check with your health care provider or pharmacist before using salt substitutes.  Eat fresh foods.  Eat more vegetables and fruits.  Limit canned vegetables. If you do use them, rinse them well to decrease the sodium.  Limit cheese to 1 oz (28 g) per day.  Eat lower-sodium products, often labeled as "lower sodium" or "no salt added."  Avoid foods that contain monosodium glutamate (MSG). MSG is  sometimes added to [place] food and some canned foods.  Check food labels (Nutrition Facts labels) on foods to learn how much sodium is in one serving.  Eat more home-cooked food and less restaurant, buffet, and fast food.  When eating at a restaurant, ask that your food be prepared with less salt, or no salt if possible. How do I read food labels for sodium information? The Nutrition Facts label lists the amount of sodium in one serving of the food. If you eat more than one serving, you must multiply the listed amount of sodium by the number of servings. Food labels may also identify foods as:  Sodium free-Less than 5 mg in a serving.  Very low sodium-35 mg or less in a serving.  Low sodium-140 mg or less in a serving.  Light in sodium-50% less sodium in a serving. For example, if a food that usually has 300 mg of sodium is changed to become light in sodium, it will have 150 mg of sodium.  Reduced sodium-25% less sodium in a serving. For example, if a food that usually has 400 mg of sodium is changed to reduced sodium, it will have 300 mg of sodium. What foods can I eat? Grains  Low-sodium cereals, including oats, puffed wheat and rice, and shredded wheat cereals. Low-sodium crackers. Unsalted rice and pasta. Lower-sodium bread. Vegetables  Frozen or fresh vegetables. Low-sodium or reduced-sodium canned vegetables. Low-sodium or reduced-sodium tomato sauce and paste. Low-sodium or reduced-sodium tomato and vegetable juices. Fruits  Fresh, frozen, and canned fruit. Fruit juice. Meat and Other Protein Products  Low-sodium canned tuna and salmon. Fresh or frozen meat, poultry, seafood, and fish. Lamb. Unsalted nuts. Dried beans, peas, and lentils without added salt. Unsalted canned beans. Homemade soups without salt. Eggs. Dairy  Milk. Soy milk. Ricotta cheese. Low-sodium or reduced-sodium cheeses. Yogurt. Condiments  Fresh and dried herbs and spices. Salt-free seasonings. Onion and  garlic powders. Low-sodium varieties of mustard and ketchup. Fresh or refrigerated horseradish. Lemon juice. Fats and Oils  Reduced-sodium salad dressings. Unsalted butter. Other  Unsalted popcorn and pretzels. The items listed above may not be a complete list of recommended foods or beverages. Contact your dietitian for more options.  What foods are not recommended? Grains  Instant hot cereals. Bread stuffing, pancake, and biscuit mixes. Croutons. Seasoned rice or pasta mixes. Noodle soup cups. Boxed or frozen macaroni and cheese. Self-rising flour. Regular salted crackers. Vegetables  Regular canned vegetables. Regular canned tomato sauce and paste. Regular tomato and vegetable juices. Frozen vegetables in sauces. Salted [place] fries. Olives. Eric Rivers. Relishes. Sauerkraut. Salsa. Meat and Other Protein Products  Salted, canned, smoked, spiced, or pickled meats, seafood, or fish. Bacon, ham, sausage, hot dogs, corned beef, chipped beef, and packaged luncheon meats. Salt pork. Jerky. Pickled herring. Anchovies, regular canned tuna, and sardines. Salted nuts. Dairy  Processed cheese and cheese spreads. Cheese curds. Blue cheese and cottage cheese. Buttermilk. Condiments  Onion and garlic salt, seasoned salt, table salt, and sea salt. Canned and packaged gravies. Worcestershire sauce. Tartar sauce. Barbecue sauce. Teriyaki sauce. Soy sauce, including reduced sodium. Steak sauce. Fish sauce. Oyster sauce. Cocktail sauce. Horseradish that you find on the shelf. Regular ketchup and mustard. Meat flavorings and tenderizers. Bouillon cubes. Hot sauce. Tabasco sauce. Marinades. Taco seasonings. Relishes. Fats and Oils  Regular salad dressings. Salted butter. Margarine. Ghee. Bacon fat. Other  Potato and tortilla chips. Corn chips and puffs. Salted popcorn and pretzels. Canned or dried soups. Pizza. Frozen entrees and pot pies. The items listed above may not be a complete list of foods and beverages to  avoid. Contact your dietitian for more information.  This information is not intended to replace advice given to you by your health care provider. Make sure you discuss any questions you have with your health care provider. Document Released: 08/09/2001 Document Revised: 07/26/2015 Document Reviewed: 12/22/2012 Elsevier Interactive Patient Education  2017 [employer].       If you need a refill on your cardiac medications before your next appointment, please call your pharmacy.

## 2016-02-19 NOTE — Progress Notes (Signed)
02/19/2016 Eric Rivers   January 08, 1931  [id number]  Primary Physician Cathlean Cower, MD Primary Cardiologist: Dr. Meda Coffee   Reason for Visit/CC: F/u for Chronic Diastolic HF   HPI:  ILIYA Rivers is a 82 y.o. male  with a history of known diastolic HF, chronic AF, status post emergent surgery for ascending aortic dissection back in 2014 with reimplantation of his coronary arteries. He has chronic stable exertional shortness of breath. Other issues include aortic stenosis, chronic anemia, bladder cancer, DM, and GERD. He is followed by Dr. Meda Coffee. He was recently seen by her on 01/17/16 for 2 month f/u. He noted increased LEE at that visit and had admitted to several missed doses of lasix. He is not always compliant with Lasix due to inconvenience  with frequent urination, especially on days when he has appts and errands to run. He also enjoys salt. He denied CP and dyspnea at his last OV.   Dr. Meda Coffee elected to increase his lasix to 40 mg BID x several days given a/c diastolic HF in the setting of medication noncompliance. He now presents back for 4 week f/u.  He states he has done ok. He still has mild LEE on exam and exertional dyspnea with moderate activity. No limitations with ADLs. No resting dyspnea. He sleeps upright in a recliner. He has been doing this for years, mainly due to back pain. He is back on once daily Lasix. He did not take his lasix today due to his appointment. He admits that he does not limit sodium. He is also not compliant with daily Lasix.     Current Meds  Medication Sig  . acetaminophen (TYLENOL) 500 MG tablet Take 1,000 mg by mouth daily as needed for mild pain, moderate pain, fever or headache.   . allopurinol (ZYLOPRIM) 300 MG tablet take 1 tablet by mouth once daily  . amLODipine (NORVASC) 5 MG tablet Take 1 tablet (5 mg total) by mouth daily.  [name] Kitchen aspirin EC 325 MG tablet Take 325 mg by mouth daily.  [name] Kitchen atorvastatin (LIPITOR) 20 MG tablet take 1 tablet by  mouth once daily  . Calcium Carb-Cholecalciferol (CALCIUM 600 + D PO) Take 1 tablet by mouth at bedtime.   [name] Kitchen desonide (DESOWEN) 0.05 % lotion Apply 1 application topically daily as needed (rosacea).   . Ferrous Sulfate (IRON) 325 (65 Fe) MG TABS Take 325 mg by mouth 2 (two) times daily. (Patient taking differently: Take 650 mg by mouth daily. )  . finasteride (PROSCAR) 5 MG tablet Take 1 tablet (5 mg total) by mouth every morning.  . furosemide (LASIX) 40 MG tablet Take 1 tablet (40 mg total) by mouth daily.  [name] Kitchen JANUVIA 100 MG tablet take 1 tablet by mouth once daily  . ketoconazole (NIZORAL) 2 % shampoo Apply 1 application topically 4 (four) times a week.  . mirabegron ER (MYRBETRIQ) 50 MG TB24 tablet Take 50 mg by mouth daily.  . Multiple Vitamins-Minerals (ONCOVITE PO) Take 1 tablet by mouth daily.   . pantoprazole (PROTONIX) 40 MG tablet take 1 tablet by mouth once daily  . potassium chloride (K-DUR,KLOR-CON) 10 MEQ tablet Take 1 tablet (10 mEq total) by mouth daily.  [name] Kitchen senna (SENOKOT) 8.6 MG tablet Take 1 tablet by mouth at bedtime.   . Tamsulosin HCl (FLOMAX) 0.4 MG CAPS Take 0.4 mg by mouth at bedtime.   . traZODone (DESYREL) 50 MG tablet Take 1 tablet (50 mg total) by mouth See admin instructions.  [name] Kitchen warfarin (  COUMADIN) 4 MG tablet take as directed BY ANTICOAGULATION CLINIC   Allergies  Allergen Reactions  . Metoprolol Other (See Comments)    Marked bradycardia at 25 mg bid   Past Medical History:  Diagnosis Date  . ACE-inhibitor cough   . Aortic dissection [facility])    Surgical repair February, 2014  . Aortic stenosis    mild....echo... september... 2010/ mild... echo.[name] KitchenMarland KitchenDec, 2011  . Atrial fibrillation (HCC)     Chronic,   24 hour holter, September, 2011.... atrial fib rate is controlled.... there is some bradycardia but  no marked pauses  . Bladder cancer [facility])    recurrence with TUR-B March '09  . DDD (degenerative disc disease)    lumbar spine  . Diabetes mellitus    type 2   . Diabetes mellitus with neuropathy ([place]) 12/04/2006   Qualifier: Diagnosis of  By: Linda Hedges MD, Heinz Knuckles  medications - DPP4   . Diverticulosis of colon with hemorrhage 01/01/2014  . Ejection fraction    EF 60%, echo, 01/2010  . Fluid overload   . Fracture of metacarpal bone 11/30/11  . GERD (gastroesophageal reflux disease)   . Gout   . History of echocardiogram    Echo 5/17 - mild LVH, EF 55-60%, mod AS (mean 16 mmHg, peak 29 mmHg), MAC, midl MR, massive BAE, mod dilated RV, low normal RVSF, mod TR, PASP 64 mmHg  . Hx of colonoscopy    approx. 10 years  . Hypercholesterolemia   . Hyperlipidemia   . Hypertension   . Mitral regurgitation   . Normal nuclear stress test    06/2005 , also ABI normal 2007  . Prostate cancer ([place])    radiation tx.  . Pulmonary hypertension    63mmHg, echo, 01/2010  . Shortness of breath    on exertion  . Warfarin anticoagulation    Atrial fib  . Whooping cough    Family History  Problem Relation Age of Onset  . Prostate cancer Father 62    passed with prostate ca  . Cancer Father   . Stroke Father   . Heart disease Mother   . Hypertension Mother   . Heart attack Mother    Past Surgical History:  Procedure Laterality Date  . ASCENDING AORTIC ROOT REPLACEMENT N/A 04/15/2012   Procedure: ASCENDING AORTIC ROOT REPLACEMENT;  Surgeon: Gaye Pollack, MD;  Location: [facility];  Service: Open Heart Surgery;  Laterality: N/A;  . bladder cancer biopsy  10/16/2010   negative for malignancy  . bladder microscopic  04/13/2007   high grade papillary urothelial lesions  . COLONOSCOPY N/A 01/01/2014   Procedure: COLONOSCOPY;  Surgeon: Gatha Mayer, MD;  Location: [facility];  Service: Endoscopy;  Laterality: N/A;  . CYSTOSCOPY/RETROGRADE/URETEROSCOPY Bilateral 04/04/2013   Procedure: CYSTOSCOPY WITH RETROGRADE PYELOGRAM AND BLADDER BIOPSY;  Surgeon: Molli Hazard, MD;  Location: [facility];  Service: Urology;  Laterality: Bilateral;  . 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 EXPLORATION POST OPERATIVE OPEN HEART  04/2012  . Herniatic disc surgery    . LUMBAR LAMINECTOMY     '02  . PROSTATE BIOPSY  09/25/2011   GLEASON 3+3=6 AND 4+3=7  . TEE WITHOUT CARDIOVERSION N/A 04/15/2012   Procedure: TRANSESOPHAGEAL ECHOCARDIOGRAM (TEE);  Surgeon: Gaye Pollack, MD;  Location: [facility];  Service: Open Heart Surgery;  Laterality: N/A;  . TRANSURETHRAL RESECTION OF BLADDER TUMOR N/A 06/13/2015   Procedure: TRANSURETHRAL RESECTION OF PROSTATE (TURP) CLOT  EVACUATION AND FULGERATION, BLADDER STONE REMOVAL ;  Surgeon: Alexis Frock, MD;  Location: [facility];  Service: Urology;  Laterality: N/A;  . TUR-BT '06, '09     Social History   Social History  . Marital status: Single    Spouse name: N/A  . Number of children: 0  . Years of education: 9   Occupational History  . history and Advice worker Retired    retired   Social History Main Topics  . Smoking status: Former Smoker    Packs/day: 1.00    Types: Cigarettes    Quit date: 03/04/1975  . Smokeless tobacco: Never Used  . Alcohol use No  . Drug use: No  . Sexual activity: Not Currently   Other Topics Concern  . Not on file   Social History Narrative   Confirmed batchelor. Retired Education officer, museum. World traveler- Ambulance person. I- ADLS. End of life Care   No CPR, no prolonged intubation, no prolonged artificial hydration or feeding, no heroic or futile measures.       Next trip Sept [place]13 [place]      Last cruise - two cruises back to back to the [place] in January '15. May'15: [place] with stops in [place], [place], [place], [place], [place]. 15 days      Fall '15: 50 Days from [place]. [place], [place], [place], [place], [place], [place], [place], [place], [place].       May '15 - 15 days up the [employer] as far as [place]              Review of Systems: General:  negative for chills, fever, night sweats or weight changes.  Cardiovascular: negative for chest pain, dyspnea on exertion, edema, orthopnea, palpitations, paroxysmal nocturnal dyspnea or shortness of breath Dermatological: negative for rash Respiratory: negative for cough or wheezing Urologic: negative for hematuria Abdominal: negative for nausea, vomiting, diarrhea, bright red blood per rectum, melena, or hematemesis Neurologic: negative for visual changes, syncope, or dizziness All other systems reviewed and are otherwise negative except as noted above.   Physical Exam:  Blood pressure (!) 146/78, pulse 81, height 5\' 8"  (1.727 m), weight 220 lb (99.8 kg).  General appearance: alert, cooperative and no distress Neck: no carotid bruit and no JVD Lungs: clear to auscultation bilaterally Heart: regular rate and rhythm, S1, S2 normal, no murmur, click, rub or gallop Extremities: 1+ tense bilatearl LEE Pulses: 2+ and symmetric Skin: Skin color, texture, turgor normal. No rashes or lesions Neurologic: Grossly normal  EKG not performed   ASSESSMENT AND PLAN:   1. Acute on Chronic Diastolic CHF: EF normal on echo 07/2015. He has DD. Still with edema. No significant dyspnea, other than his baseline chronic exertional dyspnea that has not changed. He has compliance issues. We discussed importance of daily compliance with diuretics, daily weights and low sodium diet to prevent fluid retention, worsening symptoms and to to prevent hospitalizations. He verbalized understanding. We will continue him on 40 mg daily. Check f/u BMP today to reassess renal function and K. We discussed sliding scale lasix, increasing Lasix to BID if weight gain of 3lb or more in 24 hrs or >5 lb in 1 week.  Low sodium diet stressed.   2. Chronic Atrial Fibrillation: HR stable in the 80s. Asymptomatic. On Coumadin for a/c.   3. Chronic Anticoagulation: on Coumadin for afib. INRs followed by Dr. Jenny Reichmann, PCP.   4.  HTN: mildly  elevated in clinic today, however he held his lasix for today's appt. Continue meds w/o change. We discussed low sodium diet which will also help  5. H/o Aortic Dissection: status post emergent surgery for ascending aortic dissection back in 2014 with reimplantation of his coronary arteries  6. Non rheumatic Aortic Valve Stenosis: 2D Echo in May 2017 showed  Moderate stenosis. Mean gradient (S): 16 mm Hg. Peak gradient (S): 29 mm Hg. Valve area (VTI): 1.09 cm^2. Valve area (Vmax): 1.2 cm^2.  PLAN  F/u with Dr. Meda Coffee in 3 months.   Lyda Jester PA-C 02/19/2016 1:51 PM

## 2016-02-20 LAB — BASIC METABOLIC PANEL
BUN: 12 mg/dL (ref 7–25)
CHLORIDE: 103 mmol/L (ref 98–110)
CO2: 28 mmol/L (ref 20–31)
CREATININE: 0.87 mg/dL (ref 0.70–1.11)
Calcium: 9.4 mg/dL (ref 8.6–10.3)
Glucose, Bld: 91 mg/dL (ref 65–99)
POTASSIUM: 3.9 mmol/L (ref 3.5–5.3)
Sodium: 141 mmol/L (ref 135–146)

## 2016-03-10 ENCOUNTER — Ambulatory Visit: Payer: Medicare Other | Admitting: Hematology

## 2016-03-10 ENCOUNTER — Other Ambulatory Visit: Payer: Medicare Other

## 2016-03-10 ENCOUNTER — Ambulatory Visit (INDEPENDENT_AMBULATORY_CARE_PROVIDER_SITE_OTHER): Payer: Medicare Other | Admitting: General Practice

## 2016-03-10 DIAGNOSIS — I272 Pulmonary hypertension, unspecified: Secondary | ICD-10-CM

## 2016-03-10 DIAGNOSIS — Z7901 Long term (current) use of anticoagulants: Secondary | ICD-10-CM

## 2016-03-10 DIAGNOSIS — Z5181 Encounter for therapeutic drug level monitoring: Secondary | ICD-10-CM

## 2016-03-10 DIAGNOSIS — I482 Chronic atrial fibrillation, unspecified: Secondary | ICD-10-CM

## 2016-03-10 LAB — POCT INR: INR: 2.3

## 2016-03-10 NOTE — Patient Instructions (Signed)
Pre visit review using our clinic review tool, if applicable. No additional management support is needed unless otherwise documented below in the visit note. 

## 2016-03-11 NOTE — Progress Notes (Signed)
I agree with this plan.

## 2016-04-07 ENCOUNTER — Encounter: Payer: Self-pay | Admitting: Hematology

## 2016-04-07 ENCOUNTER — Other Ambulatory Visit (HOSPITAL_BASED_OUTPATIENT_CLINIC_OR_DEPARTMENT_OTHER): Payer: Medicare Other

## 2016-04-07 ENCOUNTER — Ambulatory Visit (HOSPITAL_BASED_OUTPATIENT_CLINIC_OR_DEPARTMENT_OTHER): Payer: Medicare Other | Admitting: Hematology

## 2016-04-07 VITALS — BP 156/90 | HR 70 | Temp 97.9°F | Resp 16 | Ht 68.0 in | Wt 209.6 lb

## 2016-04-07 DIAGNOSIS — D472 Monoclonal gammopathy: Secondary | ICD-10-CM

## 2016-04-07 DIAGNOSIS — D509 Iron deficiency anemia, unspecified: Secondary | ICD-10-CM

## 2016-04-07 DIAGNOSIS — D508 Other iron deficiency anemias: Secondary | ICD-10-CM | POA: Diagnosis not present

## 2016-04-07 DIAGNOSIS — E538 Deficiency of other specified B group vitamins: Secondary | ICD-10-CM

## 2016-04-07 LAB — CBC & DIFF AND RETIC
BASO%: 0.1 % (ref 0.0–2.0)
Basophils Absolute: 0 10*3/uL (ref 0.0–0.1)
EOS%: 1.8 % (ref 0.0–7.0)
Eosinophils Absolute: 0.1 10*3/uL (ref 0.0–0.5)
HCT: 39 % (ref 38.4–49.9)
HGB: 12.4 g/dL — ABNORMAL LOW (ref 13.0–17.1)
Immature Retic Fract: 10.1 % (ref 3.00–10.60)
LYMPH%: 13.3 % — ABNORMAL LOW (ref 14.0–49.0)
MCH: 30.2 pg (ref 27.2–33.4)
MCHC: 31.8 g/dL — AB (ref 32.0–36.0)
MCV: 95.1 fL (ref 79.3–98.0)
MONO#: 0.6 10*3/uL (ref 0.1–0.9)
MONO%: 8.1 % (ref 0.0–14.0)
NEUT#: 6.1 10*3/uL (ref 1.5–6.5)
NEUT%: 76.7 % — ABNORMAL HIGH (ref 39.0–75.0)
Platelets: 206 10*3/uL (ref 140–400)
RBC: 4.1 10*6/uL — AB (ref 4.20–5.82)
RDW: 15.5 % — AB (ref 11.0–14.6)
Retic %: 1.44 % (ref 0.80–1.80)
Retic Ct Abs: 59.04 10*3/uL (ref 34.80–93.90)
WBC: 8 10*3/uL (ref 4.0–10.3)
lymph#: 1.1 10*3/uL (ref 0.9–3.3)

## 2016-04-07 LAB — COMPREHENSIVE METABOLIC PANEL
ALBUMIN: 4.2 g/dL (ref 3.5–5.0)
ALK PHOS: 96 U/L (ref 40–150)
ALT: 13 U/L (ref 0–55)
AST: 21 U/L (ref 5–34)
Anion Gap: 9 mEq/L (ref 3–11)
BUN: 12.4 mg/dL (ref 7.0–26.0)
CHLORIDE: 102 meq/L (ref 98–109)
CO2: 27 mEq/L (ref 22–29)
Calcium: 10.2 mg/dL (ref 8.4–10.4)
Creatinine: 1 mg/dL (ref 0.7–1.3)
EGFR: 71 mL/min/{1.73_m2} — AB (ref >90)
GLUCOSE: 149 mg/dL — AB (ref 70–140)
POTASSIUM: 4 meq/L (ref 3.5–5.1)
SODIUM: 138 meq/L (ref 136–145)
Total Bilirubin: 1.49 mg/dL — ABNORMAL HIGH (ref 0.20–1.20)
Total Protein: 8 g/dL (ref 6.4–8.3)

## 2016-04-07 LAB — FERRITIN: Ferritin: 74 ng/ml (ref 22–316)

## 2016-04-09 DIAGNOSIS — E1151 Type 2 diabetes mellitus with diabetic peripheral angiopathy without gangrene: Secondary | ICD-10-CM | POA: Diagnosis not present

## 2016-04-09 DIAGNOSIS — L603 Nail dystrophy: Secondary | ICD-10-CM | POA: Diagnosis not present

## 2016-04-09 DIAGNOSIS — I739 Peripheral vascular disease, unspecified: Secondary | ICD-10-CM | POA: Diagnosis not present

## 2016-04-11 ENCOUNTER — Ambulatory Visit (INDEPENDENT_AMBULATORY_CARE_PROVIDER_SITE_OTHER): Payer: Medicare Other | Admitting: Internal Medicine

## 2016-04-11 ENCOUNTER — Ambulatory Visit (INDEPENDENT_AMBULATORY_CARE_PROVIDER_SITE_OTHER): Payer: Medicare Other | Admitting: General Practice

## 2016-04-11 ENCOUNTER — Encounter: Payer: Self-pay | Admitting: Internal Medicine

## 2016-04-11 VITALS — BP 138/80 | HR 83 | Resp 20 | Wt 210.5 lb

## 2016-04-11 DIAGNOSIS — I5032 Chronic diastolic (congestive) heart failure: Secondary | ICD-10-CM | POA: Diagnosis not present

## 2016-04-11 DIAGNOSIS — R040 Epistaxis: Secondary | ICD-10-CM

## 2016-04-11 DIAGNOSIS — I272 Pulmonary hypertension, unspecified: Secondary | ICD-10-CM

## 2016-04-11 DIAGNOSIS — Z5181 Encounter for therapeutic drug level monitoring: Secondary | ICD-10-CM

## 2016-04-11 DIAGNOSIS — I482 Chronic atrial fibrillation, unspecified: Secondary | ICD-10-CM

## 2016-04-11 LAB — POCT INR: INR: 2.4

## 2016-04-11 NOTE — Progress Notes (Signed)
I have reviewed and agree with the plan. 

## 2016-04-11 NOTE — Progress Notes (Signed)
Subjective:  Patient ID: Eric Rivers, male    DOB: 1930/10/05  Age: 83 y.o. MRN: [id number]  CC: No chief complaint on file.   HPI LARREN PLAYFORD presents for nose bleeds L side x 2 wks off and on. On Coumadin  Outpatient Medications Prior to Visit  Medication Sig Dispense Refill  . acetaminophen (TYLENOL) 500 MG tablet Take 1,000 mg by mouth daily as needed for mild pain, moderate pain, fever or headache.     . allopurinol (ZYLOPRIM) 300 MG tablet take 1 tablet by mouth once daily 90 tablet 3  . amLODipine (NORVASC) 5 MG tablet Take 1 tablet (5 mg total) by mouth daily. 90 tablet 1  . aspirin EC 325 MG tablet Take 325 mg by mouth daily.    [name] Kitchen atorvastatin (LIPITOR) 20 MG tablet take 1 tablet by mouth once daily 90 tablet 3  . Calcium Carb-Cholecalciferol (CALCIUM 600 + D PO) Take 1 tablet by mouth at bedtime.     [name] Kitchen desonide (DESOWEN) 0.05 % lotion Apply 1 application topically daily as needed (rosacea).     . Ferrous Sulfate (IRON) 325 (65 Fe) MG TABS Take 325 mg by mouth 2 (two) times daily. (Patient taking differently: Take 650 mg by mouth daily. ) 180 each 3  . finasteride (PROSCAR) 5 MG tablet Take 1 tablet (5 mg total) by mouth every morning. 90 tablet 3  . furosemide (LASIX) 40 MG tablet Take 1 tablet (40 mg total) by mouth daily. 90 tablet 1  . JANUVIA 100 MG tablet take 1 tablet by mouth once daily 90 tablet 2  . ketoconazole (NIZORAL) 2 % shampoo Apply 1 application topically 4 (four) times a week.    . mirabegron ER (MYRBETRIQ) 50 MG TB24 tablet Take 50 mg by mouth daily.    . Multiple Vitamins-Minerals (ONCOVITE PO) Take 1 tablet by mouth daily.     . pantoprazole (PROTONIX) 40 MG tablet take 1 tablet by mouth once daily 90 tablet 3  . potassium chloride (K-DUR,KLOR-CON) 10 MEQ tablet Take 1 tablet (10 mEq total) by mouth daily. 90 tablet 3  . senna (SENOKOT) 8.6 MG tablet Take 1 tablet by mouth at bedtime.     . Tamsulosin HCl (FLOMAX) 0.4 MG CAPS Take 0.4 mg by  mouth at bedtime.     . traZODone (DESYREL) 50 MG tablet Take 1 tablet (50 mg total) by mouth See admin instructions. 90 tablet 1  . warfarin (COUMADIN) 4 MG tablet take as directed BY ANTICOAGULATION CLINIC 105 tablet 1   No facility-administered medications prior to visit.     ROS Review of Systems  Constitutional: Negative for appetite change, fatigue and unexpected weight change.  HENT: Positive for nosebleeds. Negative for congestion, sneezing, sore throat and trouble swallowing.   Eyes: Negative for itching and visual disturbance.  Respiratory: Negative for cough.   Cardiovascular: Negative for chest pain, palpitations and leg swelling.  Gastrointestinal: Negative for abdominal distention, blood in stool, diarrhea and nausea.  Genitourinary: Negative for frequency and hematuria.  Musculoskeletal: Negative for back pain, gait problem, joint swelling and neck pain.  Skin: Negative for rash.  Neurological: Negative for dizziness, tremors, speech difficulty and weakness.  Psychiatric/Behavioral: Negative for agitation, dysphoric mood and sleep disturbance. The patient is not nervous/anxious.     Objective:  BP 138/80   Pulse 83   Resp 20   Wt 210 lb 8 oz (95.5 kg)   SpO2 96%   BMI 32.01 kg/m  BP Readings from Last 3 Encounters:  04/11/16 138/80  04/07/16 (!) 156/90  02/19/16 (!) 146/78    Wt Readings from Last 3 Encounters:  04/11/16 210 lb 8 oz (95.5 kg)  04/07/16 209 lb 9.6 oz (95.1 kg)  02/19/16 220 lb (99.8 kg)    Physical Exam  Constitutional: He is oriented to person, place, and time. He appears well-developed. No distress.  NAD  HENT:  Mouth/Throat: Oropharynx is clear and moist.  Eyes: Conjunctivae are normal. Pupils are equal, round, and reactive to light.  Neck: Normal range of motion. No JVD present. No thyromegaly present.  Cardiovascular: Normal rate, regular rhythm, normal heart sounds and intact distal pulses.  Exam reveals no gallop and no friction  rub.   No murmur heard. Pulmonary/Chest: Effort normal and breath sounds normal. No respiratory distress. He has no wheezes. He has no rales. He exhibits no tenderness.  Abdominal: Soft. Bowel sounds are normal. He exhibits no distension and no mass. There is no tenderness. There is no rebound and no guarding.  Musculoskeletal: Normal range of motion. He exhibits no edema or tenderness.  Lymphadenopathy:    He has no cervical adenopathy.  Neurological: He is alert and oriented to person, place, and time. He has normal reflexes. No cranial nerve deficit. He exhibits normal muscle tone. He displays a negative Romberg sign. Coordination and gait normal.  Skin: Skin is warm and dry. No rash noted.  Psychiatric: He has a normal mood and affect. His behavior is normal. Judgment and thought content normal.  L nare septum w/ulcer   Procedure: chem cauterization Indication: epistaxis on coumadin Verbal consent We cauterized a bleeding 2 mm ulceration in he L nare w/silver nitrate stick Tol well. Compl - none  Lab Results  Component Value Date   WBC 8.0 04/07/2016   HGB 12.4 (L) 04/07/2016   HCT 39.0 04/07/2016   PLT 206 04/07/2016   GLUCOSE 149 (H) 04/07/2016   CHOL 102 04/10/2015   TRIG 57.0 04/10/2015   HDL 45.60 04/10/2015   LDLCALC 45 04/10/2015   ALT 13 04/07/2016   AST 21 04/07/2016   NA 138 04/07/2016   K 4.0 04/07/2016   CL 103 02/19/2016   CREATININE 1.0 04/07/2016   BUN 12.4 04/07/2016   CO2 27 04/07/2016   TSH 1.48 04/10/2015   PSA 2.19 12/06/2007   INR 2.3 03/10/2016   HGBA1C 6.6 (H) 04/10/2015   MICROALBUR 4.1 (H) 04/10/2015    Ct Angio Chest Aorta W/cm &/or Wo/cm  Result Date: 11/21/2015 CLINICAL DATA:  Ascending thoracic aortic dissection. EXAM: CT ANGIOGRAPHY CHEST WITH CONTRAST TECHNIQUE: Multidetector CT imaging of the chest was performed using the standard protocol during bolus administration of intravenous contrast. Multiplanar CT image reconstructions and  MIPs were obtained to evaluate the vascular anatomy. CONTRAST:  75 mL of Isovue 370 intravenously. COMPARISON:  CT scan of November 29, 2014. FINDINGS: Cardiovascular: Status post surgical repair of ascending thoracic aortic dissection. These findings are stable. Aneurysmal dilatation of the proximal portion of the aortic arch is noted with max measured diameter 5.1 cm, which is not significantly changed compared to prior exam. Great vessels are widely patent without significant stenosis. Right innominate artery is mildly dilated. Atherosclerosis of thoracic aorta is noted. Distal portion of aortic arch measures 4.9 cm in diameter. Distal portion of descending thoracic aorta measures 3.2 cm which is stable and consistent with mild dilatation. Stable thrombosed false lumen is noted in the distal aortic arch and proximal descending thoracic  aorta. Coronary artery calcifications are noted. Mediastinum/Nodes: No mediastinal mass or adenopathy is noted. Lungs/Pleura: No pneumothorax or pleural effusion is noted. Subsegmental atelectasis is noted inferiorly in the lingular segment of left upper lobe. Stable 7 mm nodule is noted in right lower lobe best seen on image number 55 of series 5. Upper Abdomen: There is noted a dissection beginning in the distal portion of the descending thoracic aorta which extends into the visualized portion of the proximal abdominal aorta. This is stable compared to prior exam. The celiac and superior mesenteric arteries are visualized and appear to be widely patent. Musculoskeletal: No significant osseous abnormality is noted. Review of the MIP images confirms the above findings. IMPRESSION: Coronary artery calcifications are noted suggesting coronary artery disease. Status post surgical repair of ascending thoracic aortic dissection. These findings are stable. 5.1 cm aneurysm involving the proximal portion of the thoracic aortic arch is again noted and stable. Recommend semi-annual imaging  followup by CTA or MRA and referral to cardiothoracic surgery if not already obtained. This recommendation follows 2010 ACCF/AHA/AATS/ACR/ASA/SCA/SCAI/SIR/STS/SVM Guidelines for the Diagnosis and Management of Patients With Thoracic Aortic Disease. Circulation. 2010; 121: e266-e36. Stable thrombosed false lumen is noted in the distal aortic arch and proximal descending thoracic aorta compared to prior exam. Electronically Signed   By: Marijo Conception, M.D.   On: 11/21/2015 13:47    Assessment & Plan:   There are no diagnoses linked to this encounter. I am having Mr. Cacciatore maintain his Multiple Vitamins-Minerals (ONCOVITE PO), tamsulosin, Calcium Carb-Cholecalciferol (CALCIUM 600 + D PO), acetaminophen, ketoconazole, desonide, finasteride, senna, aspirin EC, potassium chloride, mirabegron ER, JANUVIA, Iron, furosemide, amLODipine, warfarin, traZODone, pantoprazole, allopurinol, and atorvastatin.  No orders of the defined types were placed in this encounter.    Follow-up: No Follow-up on file.  Walker Kehr, MD

## 2016-04-11 NOTE — Patient Instructions (Signed)
Pre visit review using our clinic review tool, if applicable. No additional management support is needed unless otherwise documented below in the visit note. 

## 2016-04-11 NOTE — Assessment & Plan Note (Addendum)
INR   L nare septum w/ulcer - we treated it  w/silver nitrate stick - see procedure

## 2016-04-11 NOTE — Progress Notes (Signed)
Pre visit review using our clinic review tool, if applicable. No additional management support is needed unless otherwise documented below in the visit note. 

## 2016-04-11 NOTE — Patient Instructions (Signed)
You can use "Afrin" nasal spray for nasal bleeding

## 2016-04-11 NOTE — Assessment & Plan Note (Signed)
INR today.

## 2016-04-12 ENCOUNTER — Telehealth: Payer: Self-pay | Admitting: Hematology

## 2016-04-12 NOTE — Telephone Encounter (Signed)
No los per 04/07/16 visit

## 2016-04-14 ENCOUNTER — Ambulatory Visit: Payer: Medicare Other

## 2016-05-12 ENCOUNTER — Ambulatory Visit: Payer: Medicare Other

## 2016-05-19 ENCOUNTER — Ambulatory Visit (INDEPENDENT_AMBULATORY_CARE_PROVIDER_SITE_OTHER): Payer: Medicare Other | Admitting: General Practice

## 2016-05-19 DIAGNOSIS — I272 Pulmonary hypertension, unspecified: Secondary | ICD-10-CM

## 2016-05-19 DIAGNOSIS — Z5181 Encounter for therapeutic drug level monitoring: Secondary | ICD-10-CM | POA: Diagnosis not present

## 2016-05-19 DIAGNOSIS — I482 Chronic atrial fibrillation, unspecified: Secondary | ICD-10-CM

## 2016-05-19 LAB — POCT INR: INR: 2.4

## 2016-05-19 NOTE — Progress Notes (Signed)
I agree with this plan.

## 2016-05-19 NOTE — Patient Instructions (Signed)
Pre visit review using our clinic review tool, if applicable. No additional management support is needed unless otherwise documented below in the visit note. 

## 2016-05-21 ENCOUNTER — Encounter (INDEPENDENT_AMBULATORY_CARE_PROVIDER_SITE_OTHER): Payer: Self-pay

## 2016-05-21 ENCOUNTER — Ambulatory Visit (INDEPENDENT_AMBULATORY_CARE_PROVIDER_SITE_OTHER): Payer: Medicare Other | Admitting: Cardiology

## 2016-05-21 VITALS — BP 132/82 | HR 76 | Ht 68.0 in | Wt 211.0 lb

## 2016-05-21 DIAGNOSIS — I5033 Acute on chronic diastolic (congestive) heart failure: Secondary | ICD-10-CM

## 2016-05-21 DIAGNOSIS — I7101 Dissection of ascending aorta: Secondary | ICD-10-CM

## 2016-05-21 DIAGNOSIS — I350 Nonrheumatic aortic (valve) stenosis: Secondary | ICD-10-CM | POA: Diagnosis not present

## 2016-05-21 DIAGNOSIS — I10 Essential (primary) hypertension: Secondary | ICD-10-CM | POA: Diagnosis not present

## 2016-05-21 DIAGNOSIS — Z7901 Long term (current) use of anticoagulants: Secondary | ICD-10-CM | POA: Diagnosis not present

## 2016-05-21 DIAGNOSIS — I482 Chronic atrial fibrillation, unspecified: Secondary | ICD-10-CM

## 2016-05-21 DIAGNOSIS — I5032 Chronic diastolic (congestive) heart failure: Secondary | ICD-10-CM | POA: Diagnosis not present

## 2016-05-21 NOTE — Patient Instructions (Signed)

## 2016-05-21 NOTE — Progress Notes (Signed)
05/21/2016 Eric Rivers   [birth year]  [id number]  Primary Physician Cathlean Cower, MD Primary Cardiologist: Dr. Meda Coffee   Reason for Visit/CC: F/u for Chronic Diastolic HF   HPI:  Eric Rivers is a 83 y.o. male  with a history of known diastolic HF, chronic AF, status post emergent surgery for ascending aortic dissection back in 2014 with reimplantation of his coronary arteries. He has chronic stable exertional shortness of breath. Other issues include aortic stenosis, chronic anemia, bladder cancer, DM, and GERD. He is followed by Dr. Meda Coffee. He was recently seen by her on 01/17/16 for 2 month f/u. He noted increased LEE at that visit and had admitted to several missed doses of lasix. He is not always compliant with Lasix due to inconvenience  with frequent urination, especially on days when he has appts and errands to run. He also enjoys salt. He denied CP and dyspnea at his last OV.   05/21/2016  - 3 months follow-up, the patient has been doing great. He brings weight diary and his weight has been up and down a few pounds but overall very stable. He only takes Lasix a few times a week as needed. No lower extremity edema orthopnea or proximal nocturnal dyspnea. He has had no chest pain recently he has lost about 7 pounds. He denies any palpitations, dizziness or syncope. He is tolerating atorvastatin well. He has been experiencing nosebleeds earlier did year but his hemoglobin was 12.4. Now resolved with using Afrin.   Current Meds  Medication Sig  . acetaminophen (TYLENOL) 500 MG tablet Take 1,000 mg by mouth daily as needed for mild pain, moderate pain, fever or headache.   . allopurinol (ZYLOPRIM) 300 MG tablet take 1 tablet by mouth once daily  . amLODipine (NORVASC) 5 MG tablet Take 1 tablet (5 mg total) by mouth daily.  [name] Kitchen aspirin EC 325 MG tablet Take 325 mg by mouth daily.  [name] Kitchen atorvastatin (LIPITOR) 20 MG tablet take 1 tablet by mouth once daily  . Calcium Carb-Cholecalciferol  (CALCIUM 600 + D PO) Take 1 tablet by mouth at bedtime.   [name] Kitchen desonide (DESOWEN) 0.05 % lotion Apply 1 application topically daily as needed (rosacea).   . Ferrous Sulfate (IRON) 325 (65 Fe) MG TABS Take 325 mg by mouth 2 (two) times daily. (Patient taking differently: Take 650 mg by mouth daily. )  . finasteride (PROSCAR) 5 MG tablet Take 1 tablet (5 mg total) by mouth every morning.  . furosemide (LASIX) 40 MG tablet Take 1 tablet (40 mg total) by mouth daily.  [name] Kitchen JANUVIA 100 MG tablet take 1 tablet by mouth once daily  . ketoconazole (NIZORAL) 2 % shampoo Apply 1 application topically 4 (four) times a week.  . mirabegron ER (MYRBETRIQ) 50 MG TB24 tablet Take 50 mg by mouth daily.  . Multiple Vitamins-Minerals (ONCOVITE PO) Take 1 tablet by mouth daily.   . pantoprazole (PROTONIX) 40 MG tablet take 1 tablet by mouth once daily  . potassium chloride (K-DUR,KLOR-CON) 10 MEQ tablet Take 1 tablet (10 mEq total) by mouth daily.  [name] Kitchen senna (SENOKOT) 8.6 MG tablet Take 1 tablet by mouth at bedtime.   . Tamsulosin HCl (FLOMAX) 0.4 MG CAPS Take 0.4 mg by mouth at bedtime.   . traZODone (DESYREL) 50 MG tablet Take 1 tablet (50 mg total) by mouth See admin instructions.  [name] Kitchen warfarin (COUMADIN) 4 MG tablet take as directed BY ANTICOAGULATION CLINIC   Allergies  Allergen Reactions  . Metoprolol  Other (See Comments)    Marked bradycardia at 25 mg bid   Past Medical History:  Diagnosis Date  . ACE-inhibitor cough   . Aortic dissection [facility])    Surgical repair February, 2014  . Aortic stenosis    mild....echo... september... 2010/ mild... echo.[name] KitchenMarland KitchenDec, 2011  . Atrial fibrillation (HCC)     Chronic,   24 hour holter, September, 2011.... atrial fib rate is controlled.... there is some bradycardia but  no marked pauses  . Bladder cancer [facility])    recurrence with TUR-B March '09  . DDD (degenerative disc disease)    lumbar spine  . Diabetes mellitus    type 2  . Diabetes mellitus with neuropathy ([place])  12/04/2006   Qualifier: Diagnosis of  By: Linda Hedges MD, Heinz Knuckles  medications - DPP4   . Diverticulosis of colon with hemorrhage 01/01/2014  . Ejection fraction    EF 60%, echo, 01/2010  . Fluid overload   . Fracture of metacarpal bone 11/30/11  . GERD (gastroesophageal reflux disease)   . Gout   . History of echocardiogram    Echo 5/17 - mild LVH, EF 55-60%, mod AS (mean 16 mmHg, peak 29 mmHg), MAC, midl MR, massive BAE, mod dilated RV, low normal RVSF, mod TR, PASP 64 mmHg  . Hx of colonoscopy    approx. 10 years  . Hypercholesterolemia   . Hyperlipidemia   . Hypertension   . Mitral regurgitation   . Normal nuclear stress test    06/2005 , also ABI normal 2007  . Prostate cancer ([place])    radiation tx.  . Pulmonary hypertension    31mHg, echo, 01/2010  . Shortness of breath    on exertion  . Warfarin anticoagulation    Atrial fib  . Whooping cough    Family History  Problem Relation Age of Onset  . Prostate cancer Father 761   passed with prostate ca  . Cancer Father   . Stroke Father   . Heart disease Mother   . Hypertension Mother   . Heart attack Mother    Past Surgical History:  Procedure Laterality Date  . ASCENDING AORTIC ROOT REPLACEMENT N/A 04/15/2012   Procedure: ASCENDING AORTIC ROOT REPLACEMENT;  Surgeon: BGaye Pollack MD;  [place]  Service: Open Heart Surgery;  Laterality: N/A;  . bladder cancer biopsy  10/16/2010   negative for malignancy  . bladder microscopic  04/13/2007   high grade papillary urothelial lesions  . COLONOSCOPY N/A 01/01/2014   Procedure: COLONOSCOPY;  Surgeon: CGatha Mayer MD;  [facility];  Service: Endoscopy;  Laterality: N/A;  . CYSTOSCOPY/RETROGRADE/URETEROSCOPY Bilateral 04/04/2013   Procedure: CYSTOSCOPY WITH RETROGRADE PYELOGRAM AND BLADDER BIOPSY;  Surgeon: DMolli Hazard MD;  [facility];  Service: Urology;  Laterality: Bilateral;  . 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 EXPLORATION POST OPERATIVE OPEN HEART  04/2012  . Herniatic disc surgery    . LUMBAR LAMINECTOMY     '02  . PROSTATE BIOPSY  09/25/2011   GLEASON 3+3=6 AND 4+3=7  . TEE WITHOUT CARDIOVERSION N/A 04/15/2012   Procedure: TRANSESOPHAGEAL ECHOCARDIOGRAM (TEE);  Surgeon: BGaye Pollack MD;  [place]  Service: Open Heart Surgery;  Laterality: N/A;  . TRANSURETHRAL RESECTION OF BLADDER TUMOR N/A 06/13/2015   Procedure: TRANSURETHRAL RESECTION OF PROSTATE (TURP) CLOT EVACUATION AND FULGERATION, BLADDER STONE REMOVAL ;  Surgeon: TAlexis Frock MD;  [facility];  Service: Urology;  Laterality: N/A;  . TUR-BT '06, '09     Social History   Social History  . Marital status: Single    Spouse name: N/A  . Number of children: 0  . Years of education: 75   Occupational History  . history and Advice worker Retired    retired   Social History Main Topics  . Smoking status: Former Smoker    Packs/day: 1.00    Types: Cigarettes    Quit date: 03/04/1975  . Smokeless tobacco: Never Used  . Alcohol use No  . Drug use: No  . Sexual activity: Not Currently   Other Topics Concern  . Not on file   Social History Narrative   Confirmed batchelor. Retired Education officer, museum. World traveler- Ambulance person. I- ADLS. End of life Care   No CPR, no prolonged intubation, no prolonged artificial hydration or feeding, no heroic or futile measures.       Next trip Sept '13 [place]      Last cruise - two cruises back to back to the [place] in January '15. May'15: From [place] to [place] with stops in [place], [place], [place], [place], [place]. 15 days      Fall '15: 50 Days from [place]. [place], [place], [place], [place], [place], [place], [place].       May '15 - 15 days up the [employer] as far as [place]              Review of Systems: General: negative for chills, fever, night sweats or  weight changes.  Cardiovascular: negative for chest pain, dyspnea on exertion, edema, orthopnea, palpitations, paroxysmal nocturnal dyspnea or shortness of breath Dermatological: negative for rash Respiratory: negative for cough or wheezing Urologic: negative for hematuria Abdominal: negative for nausea, vomiting, diarrhea, bright red blood per rectum, melena, or hematemesis Neurologic: negative for visual changes, syncope, or dizziness All other systems reviewed and are otherwise negative except as noted above.   Physical Exam:  Blood pressure 132/82, pulse 76, height 5' 8"  (1.727 m), weight 211 lb (95.7 kg), SpO2 93 %.  General appearance: alert, cooperative and no distress Neck: no carotid bruit and no JVD Lungs: clear to auscultation bilaterally Heart: regular rate and rhythm, S1, S2 normal, no murmur, click, rub or gallop Extremities: 1+ tense bilatearl LEE Pulses: 2+ and symmetric Skin: Skin color, texture, turgor normal. No rashes or lesions Neurologic: Grossly normal  EKG not performed     ASSESSMENT AND PLAN:   1. Acute on Chronic Diastolic CHF: EF normal on echo 07/2015. He has DD.  - he is euvolemic - continue the same regimen  2. Chronic Atrial Fibrillation: HR stable in the 80s. Asymptomatic. On Coumadin for a/c. No bleeding.  3. Chronic Anticoagulation: on Coumadin for afib.   4. HTN: well controlled today.  5. H/o Aortic Dissection: status post emergent surgery for ascending aortic dissection back in 2014 with reimplantation of his coronary arteries  6. Non rheumatic Aortic Valve Stenosis: 2D Echo in May 2017 showed  Moderate stenosis. Mean gradient (S): 16 mm Hg. Peak gradient (S): 29 mm Hg. He still has clear S2 and no difference in symptoms, no echo now, we will follow in 6 months. He is educated about symptoms of severe aortic stenosis.  PLAN  F/u with Dr. Meda Coffee in 6 months.   Ena Dawley , MD 05/21/2016 12:02 PM

## 2016-06-12 ENCOUNTER — Ambulatory Visit (INDEPENDENT_AMBULATORY_CARE_PROVIDER_SITE_OTHER): Payer: Medicare Other | Admitting: Internal Medicine

## 2016-06-12 ENCOUNTER — Encounter: Payer: Self-pay | Admitting: Internal Medicine

## 2016-06-12 ENCOUNTER — Other Ambulatory Visit (INDEPENDENT_AMBULATORY_CARE_PROVIDER_SITE_OTHER): Payer: Medicare Other

## 2016-06-12 DIAGNOSIS — R040 Epistaxis: Secondary | ICD-10-CM

## 2016-06-12 DIAGNOSIS — I10 Essential (primary) hypertension: Secondary | ICD-10-CM | POA: Diagnosis not present

## 2016-06-12 NOTE — Patient Instructions (Signed)
Please continue all other medications as before  Please have the pharmacy call with any other refills you may need.  Please keep your appointments with your specialists as you may have planned  You will be contacted regarding the referral for: ENT  Please go to the LAB in the Basement (turn left off the elevator) for the tests to be done today  You will be contacted by phone if any changes need to be made immediately.  Otherwise, you will receive a letter about your results with an explanation, but please check with MyChart first.  Please remember to sign up for MyChart if you have not done so, as this will be important to you in the future with finding out test results, communicating by private email, and scheduling acute appointments online when needed.

## 2016-06-12 NOTE — Progress Notes (Signed)
Pre visit review using our clinic review tool, if applicable. No additional management support is needed unless otherwise documented below in the visit note. 

## 2016-06-12 NOTE — Progress Notes (Signed)
Subjective:    Patient ID: Eric Rivers, male    DOB: [birth year], 83 y.o.   MRN: [id number]  HPI  Here to f/u increased freq and severity of now dialy left sided nosebleeds.   Saw Dr Alain Marion feb 9 with few intermittent left only nosebleeds on asa 325 and coumadin, last INR Lab Results  Component Value Date   INR 2.4 05/19/2016   INR 2.4 04/11/2016   INR 2.3 03/10/2016   PROTIME 16.1 08/16/2008  no fever, truama, but keeps happening every day now.  No other overt bleeding.  Pt denies chest pain, increased sob or doe, wheezing, orthopnea, PND, increased LE swelling, palpitations, dizziness or syncope.  Past Medical History:  Diagnosis Date  . ACE-inhibitor cough   . Aortic dissection [facility])    Surgical repair February, 2014  . Aortic stenosis    mild....echo... september... 2010/ mild... echo.[name] KitchenMarland KitchenDec, 2011  . Atrial fibrillation (HCC)     Chronic,   24 hour holter, September, 2011.... atrial fib rate is controlled.... there is some bradycardia but  no marked pauses  . Bladder cancer [facility])    recurrence with TUR-B March '09  . DDD (degenerative disc disease)    lumbar spine  . Diabetes mellitus    type 2  . Diabetes mellitus with neuropathy ([place]) 12/04/2006   Qualifier: Diagnosis of  By: Linda Hedges MD, Heinz Knuckles  medications - DPP4   . Diverticulosis of colon with hemorrhage 01/01/2014  . Ejection fraction    EF 60%, echo, 01/2010  . Fluid overload   . Fracture of metacarpal bone 11/30/11  . GERD (gastroesophageal reflux disease)   . Gout   . History of echocardiogram    Echo 5/17 - mild LVH, EF 55-60%, mod AS (mean 16 mmHg, peak 29 mmHg), MAC, midl MR, massive BAE, mod dilated RV, low normal RVSF, mod TR, PASP 64 mmHg  . Hx of colonoscopy    approx. 10 years  . Hypercholesterolemia   . Hyperlipidemia   . Hypertension   . Mitral regurgitation   . Normal nuclear stress test    06/2005 , also ABI normal 2007  . Prostate cancer ([place])    radiation tx.  . Pulmonary hypertension     65mHg, echo, 01/2010  . Shortness of breath    on exertion  . Warfarin anticoagulation    Atrial fib  . Whooping cough    Past Surgical History:  Procedure Laterality Date  . ASCENDING AORTIC ROOT REPLACEMENT N/A 04/15/2012   Procedure: ASCENDING AORTIC ROOT REPLACEMENT;  Surgeon: BGaye Pollack MD;  [place]  Service: Open Heart Surgery;  Laterality: N/A;  . bladder cancer biopsy  10/16/2010   negative for malignancy  . bladder microscopic  04/13/2007   high grade papillary urothelial lesions  . COLONOSCOPY N/A 01/01/2014   Procedure: COLONOSCOPY;  Surgeon: CGatha Mayer MD;  [facility];  Service: Endoscopy;  Laterality: N/A;  . CYSTOSCOPY/RETROGRADE/URETEROSCOPY Bilateral 04/04/2013   Procedure: CYSTOSCOPY WITH RETROGRADE PYELOGRAM AND BLADDER BIOPSY;  Surgeon: DMolli Hazard MD;  [facility];  Service: Urology;  Laterality: Bilateral;  . 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 EXPLORATION POST OPERATIVE OPEN HEART  04/2012  . Herniatic disc surgery    . LUMBAR LAMINECTOMY     '02  . PROSTATE BIOPSY  09/25/2011   GLEASON 3+3=6 AND 4+3=7  . TEE WITHOUT CARDIOVERSION N/A 04/15/2012   Procedure: TRANSESOPHAGEAL ECHOCARDIOGRAM (TEE);  Surgeon: Gaye Pollack, MD;  Location: [place];  Service: Open Heart Surgery;  Laterality: N/A;  . TRANSURETHRAL RESECTION OF BLADDER TUMOR N/A 06/13/2015   Procedure: TRANSURETHRAL RESECTION OF PROSTATE (TURP) CLOT EVACUATION AND FULGERATION, BLADDER STONE REMOVAL ;  Surgeon: Alexis Frock, MD;  [facility];  Service: Urology;  Laterality: N/A;  . TUR-BT '06, '09      reports that he quit smoking about 41 years ago. His smoking use included Cigarettes. He smoked 1.00 pack per day. He has never used smokeless tobacco. He reports that he does not drink alcohol or use drugs. family history includes Cancer in his father; Heart attack in his mother; Heart disease in his mother;  Hypertension in his mother; Prostate cancer (age of onset: 52) in his father; Stroke in his father. Allergies  Allergen Reactions  . Metoprolol Other (See Comments)    Marked bradycardia at 25 mg bid   Current Outpatient Prescriptions on File Prior to Visit  Medication Sig Dispense Refill  . acetaminophen (TYLENOL) 500 MG tablet Take 1,000 mg by mouth daily as needed for mild pain, moderate pain, fever or headache.     . allopurinol (ZYLOPRIM) 300 MG tablet take 1 tablet by mouth once daily 90 tablet 3  . aspirin EC 325 MG tablet Take 325 mg by mouth daily.    [name] Kitchen atorvastatin (LIPITOR) 20 MG tablet take 1 tablet by mouth once daily 90 tablet 3  . Calcium Carb-Cholecalciferol (CALCIUM 600 + D PO) Take 1 tablet by mouth at bedtime.     [name] Kitchen desonide (DESOWEN) 0.05 % lotion Apply 1 application topically daily as needed (rosacea).     . Ferrous Sulfate (IRON) 325 (65 Fe) MG TABS Take 325 mg by mouth 2 (two) times daily. (Patient taking differently: Take 650 mg by mouth daily. ) 180 each 3  . finasteride (PROSCAR) 5 MG tablet Take 1 tablet (5 mg total) by mouth every morning. 90 tablet 3  . furosemide (LASIX) 40 MG tablet Take 1 tablet (40 mg total) by mouth daily. 90 tablet 1  . JANUVIA 100 MG tablet take 1 tablet by mouth once daily 90 tablet 2  . ketoconazole (NIZORAL) 2 % shampoo Apply 1 application topically 4 (four) times a week.    . mirabegron ER (MYRBETRIQ) 50 MG TB24 tablet Take 50 mg by mouth daily.    . Multiple Vitamins-Minerals (ONCOVITE PO) Take 1 tablet by mouth daily.     . pantoprazole (PROTONIX) 40 MG tablet take 1 tablet by mouth once daily 90 tablet 3  . potassium chloride (K-DUR,KLOR-CON) 10 MEQ tablet Take 1 tablet (10 mEq total) by mouth daily. 90 tablet 3  . senna (SENOKOT) 8.6 MG tablet Take 1 tablet by mouth at bedtime.     . Tamsulosin HCl (FLOMAX) 0.4 MG CAPS Take 0.4 mg by mouth at bedtime.     . traZODone (DESYREL) 50 MG tablet Take 1 tablet (50 mg total) by mouth See  admin instructions. 90 tablet 1  . warfarin (COUMADIN) 4 MG tablet take as directed BY ANTICOAGULATION CLINIC 105 tablet 1   No current facility-administered medications on file prior to visit.     Review of Systems  All other system neg per pt    Objective:   Physical Exam . Vs VS noted,  Constitutional: Pt appears in NAD HENT: Head: NCAT.  Right Ear: External ear normal.  Left Ear: External ear normal.  Bilat tm's with no erythema.  Max sinus areas  non  tender.  Pharynx with mild erythema, no exudate Eyes: . Pupils are equal, round, and reactive to light. Conjunctivae and EOM are normal Nose: without d/c or deformity Neck: Neck supple. Gross normal ROM Cardiovascular: Normal rate and regular rhythm.   Pulmonary/Chest: Effort normal and breath sounds without rales or wheezing.  Abd:  Soft, NT, ND, + BS, no organomegaly Neurological: Pt is alert. At baseline orientation, motor grossly intact Skin: Skin is warm. No rashes, other new lesions, no LE edema Psychiatric: Pt behavior is normal without agitation         Assessment & Plan:

## 2016-06-12 NOTE — Assessment & Plan Note (Signed)
Now still mild but recurrent daily; on asa 325 and coumadin, last INR not elevated mar 19 but therapeutic, so for INR, cbc with plts, cont all meds no change, but refer ENT urgent for likely cautery vs packing

## 2016-06-12 NOTE — Assessment & Plan Note (Signed)
stable overall by history and exam, recent data reviewed with pt, and pt to continue medical treatment as before,  to f/u any worsening symptoms or concerns BP Readings from Last 3 Encounters:  06/12/16 136/88  05/21/16 132/82  04/11/16 138/80

## 2016-06-13 LAB — CBC WITH DIFFERENTIAL/PLATELET
BASOS ABS: 0 10*3/uL (ref 0.0–0.1)
Basophils Relative: 0.5 % (ref 0.0–3.0)
EOS PCT: 1.5 % (ref 0.0–5.0)
Eosinophils Absolute: 0.1 10*3/uL (ref 0.0–0.7)
HCT: 37.8 % — ABNORMAL LOW (ref 39.0–52.0)
Hemoglobin: 12.1 g/dL — ABNORMAL LOW (ref 13.0–17.0)
LYMPHS ABS: 1 10*3/uL (ref 0.7–4.0)
Lymphocytes Relative: 10.6 % — ABNORMAL LOW (ref 12.0–46.0)
MCHC: 31.9 g/dL (ref 30.0–36.0)
MCV: 94.8 fl (ref 78.0–100.0)
MONOS PCT: 7.3 % (ref 3.0–12.0)
Monocytes Absolute: 0.7 10*3/uL (ref 0.1–1.0)
NEUTROS ABS: 7.3 10*3/uL (ref 1.4–7.7)
Neutrophils Relative %: 80.1 % — ABNORMAL HIGH (ref 43.0–77.0)
Platelets: 206 10*3/uL (ref 150.0–400.0)
RBC: 3.99 Mil/uL — AB (ref 4.22–5.81)
RDW: 16.8 % — ABNORMAL HIGH (ref 11.5–15.5)
WBC: 9.2 10*3/uL (ref 4.0–10.5)

## 2016-06-13 LAB — PROTIME-INR
INR: 3 ratio — AB (ref 0.8–1.0)
PROTHROMBIN TIME: 32.5 s — AB (ref 9.6–13.1)

## 2016-06-14 ENCOUNTER — Encounter: Payer: Self-pay | Admitting: Internal Medicine

## 2016-06-18 DIAGNOSIS — R040 Epistaxis: Secondary | ICD-10-CM | POA: Diagnosis not present

## 2016-06-23 ENCOUNTER — Other Ambulatory Visit: Payer: Self-pay | Admitting: Internal Medicine

## 2016-06-23 DIAGNOSIS — R040 Epistaxis: Secondary | ICD-10-CM | POA: Diagnosis not present

## 2016-06-25 DIAGNOSIS — L603 Nail dystrophy: Secondary | ICD-10-CM | POA: Diagnosis not present

## 2016-06-25 DIAGNOSIS — E1151 Type 2 diabetes mellitus with diabetic peripheral angiopathy without gangrene: Secondary | ICD-10-CM | POA: Diagnosis not present

## 2016-06-25 DIAGNOSIS — I739 Peripheral vascular disease, unspecified: Secondary | ICD-10-CM | POA: Diagnosis not present

## 2016-06-30 ENCOUNTER — Ambulatory Visit: Payer: Medicare Other | Admitting: General Practice

## 2016-06-30 DIAGNOSIS — Z5181 Encounter for therapeutic drug level monitoring: Secondary | ICD-10-CM

## 2016-06-30 DIAGNOSIS — I272 Pulmonary hypertension, unspecified: Secondary | ICD-10-CM

## 2016-06-30 DIAGNOSIS — I482 Chronic atrial fibrillation, unspecified: Secondary | ICD-10-CM

## 2016-06-30 LAB — POCT INR: INR: 2.8

## 2016-07-01 ENCOUNTER — Other Ambulatory Visit: Payer: Self-pay | Admitting: Cardiology

## 2016-07-01 DIAGNOSIS — R040 Epistaxis: Secondary | ICD-10-CM | POA: Diagnosis not present

## 2016-07-04 ENCOUNTER — Ambulatory Visit (INDEPENDENT_AMBULATORY_CARE_PROVIDER_SITE_OTHER): Payer: Medicare Other | Admitting: Internal Medicine

## 2016-07-04 ENCOUNTER — Encounter: Payer: Self-pay | Admitting: Internal Medicine

## 2016-07-04 DIAGNOSIS — M545 Low back pain, unspecified: Secondary | ICD-10-CM | POA: Insufficient documentation

## 2016-07-04 DIAGNOSIS — E1140 Type 2 diabetes mellitus with diabetic neuropathy, unspecified: Secondary | ICD-10-CM | POA: Diagnosis not present

## 2016-07-04 DIAGNOSIS — I10 Essential (primary) hypertension: Secondary | ICD-10-CM | POA: Diagnosis not present

## 2016-07-04 MED ORDER — PREDNISONE 10 MG PO TABS: ORAL_TABLET | ORAL | 0 refills | Status: DC

## 2016-07-04 MED ORDER — TRAMADOL HCL 50 MG PO TABS: 50.0000 mg | ORAL_TABLET | Freq: Four times a day (QID) | ORAL | Status: DC | PRN

## 2016-07-04 MED ORDER — TIZANIDINE HCL 4 MG PO TABS: 4.0000 mg | ORAL_TABLET | Freq: Four times a day (QID) | ORAL | 1 refills | Status: DC | PRN

## 2016-07-04 NOTE — Progress Notes (Signed)
Subjective:    Patient ID: Eric Rivers, male    DOB: November 08, 1930, 83 y.o.   MRN: [id number]  HPI  Here with acute onset lw midline lumbar back pain, mild to mod but persistent now more constant, and Pt denies bowel or bladder change, fever, wt loss,  worsening LE pain/numbness/weakness, gait change or falls. Feels better to sit with a lumbar support pillow, worse to stand up, but after that is "not intolerable" to walk after that.  Uses cane out of the house only, has not had to use it in the house.  No worsening balance. Denies urinary symptoms such as dysuria, frequency, urgency, flank pain, hematuria or n/v, fever, chills. Denies worsening reflux, abd pain, dysphagia, n/v, bowel change or blood, takes senakot daily.    Has hx of lumbar disc surgury for right sided symptoms 2002 without neuritic pain since then. Pt denies chest pain, increased sob or doe, wheezing, orthopnea, PND, increased LE swelling, palpitations, dizziness or syncope.   Pt denies polydipsia, polyuria. Pt denies new neurological symptoms such as new headache, or facial or extremity weakness or numbness Past Medical History:  Diagnosis Date  . ACE-inhibitor cough   . Aortic dissection [facility])    Surgical repair February, 2014  . Aortic stenosis    mild....echo... september... 2010/ mild... echo.[name] KitchenMarland KitchenDec, 2011  . Atrial fibrillation (HCC)     Chronic,   24 hour holter, September, 2011.... atrial fib rate is controlled.... there is some bradycardia but  no marked pauses  . Bladder cancer [facility])    recurrence with TUR-B March '09  . DDD (degenerative disc disease)    lumbar spine  . Diabetes mellitus    type 2  . Diabetes mellitus with neuropathy ([place]) 12/04/2006   Qualifier: Diagnosis of  By: Linda Hedges MD, Heinz Knuckles  medications - DPP4   . Diverticulosis of colon with hemorrhage 01/01/2014  . Ejection fraction    EF 60%, echo, 01/2010  . Fluid overload   . Fracture of metacarpal bone 11/30/11  . GERD (gastroesophageal reflux  disease)   . Gout   . History of echocardiogram    Echo 5/17 - mild LVH, EF 55-60%, mod AS (mean 16 mmHg, peak 29 mmHg), MAC, midl MR, massive BAE, mod dilated RV, low normal RVSF, mod TR, PASP 64 mmHg  . Hx of colonoscopy    approx. 10 years  . Hypercholesterolemia   . Hyperlipidemia   . Hypertension   . Mitral regurgitation   . Normal nuclear stress test    06/2005 , also ABI normal 2007  . Prostate cancer ([place])    radiation tx.  . Pulmonary hypertension ([place])    17mHg, echo, 01/2010  . Shortness of breath    on exertion  . Warfarin anticoagulation    Atrial fib  . Whooping cough    Past Surgical History:  Procedure Laterality Date  . ASCENDING AORTIC ROOT REPLACEMENT N/A 04/15/2012   Procedure: ASCENDING AORTIC ROOT REPLACEMENT;  Surgeon: BGaye Pollack MD;  [place]  Service: Open Heart Surgery;  Laterality: N/A;  . bladder cancer biopsy  10/16/2010   negative for malignancy  . bladder microscopic  04/13/2007   high grade papillary urothelial lesions  . COLONOSCOPY N/A 01/01/2014   Procedure: COLONOSCOPY;  Surgeon: CGatha Mayer MD;  [facility];  Service: Endoscopy;  Laterality: N/A;  . CYSTOSCOPY/RETROGRADE/URETEROSCOPY Bilateral 04/04/2013   Procedure: CYSTOSCOPY WITH RETROGRADE PYELOGRAM AND BLADDER BIOPSY;  Surgeon: DMolli Hazard MD;  [facility];  Service: Urology;  Laterality: Bilateral;  . 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 EXPLORATION POST OPERATIVE OPEN HEART  04/2012  . Herniatic disc surgery    . LUMBAR LAMINECTOMY     '02  . PROSTATE BIOPSY  09/25/2011   GLEASON 3+3=6 AND 4+3=7  . TEE WITHOUT CARDIOVERSION N/A 04/15/2012   Procedure: TRANSESOPHAGEAL ECHOCARDIOGRAM (TEE);  Surgeon: Gaye Pollack, MD;  Location: [place];  Service: Open Heart Surgery;  Laterality: N/A;  . TRANSURETHRAL RESECTION OF BLADDER TUMOR N/A 06/13/2015   Procedure: TRANSURETHRAL RESECTION OF PROSTATE (TURP) CLOT  EVACUATION AND FULGERATION, BLADDER STONE REMOVAL ;  Surgeon: Alexis Frock, MD;  [facility];  Service: Urology;  Laterality: N/A;  . TUR-BT '06, '09      reports that he quit smoking about 41 years ago. His smoking use included Cigarettes. He smoked 1.00 pack per day. He has never used smokeless tobacco. He reports that he does not drink alcohol or use drugs. family history includes Cancer in his father; Heart attack in his mother; Heart disease in his mother; Hypertension in his mother; Prostate cancer (age of onset: 51) in his father; Stroke in his father. Allergies  Allergen Reactions  . Metoprolol Other (See Comments)    Marked bradycardia at 25 mg bid   Current Outpatient Prescriptions on File Prior to Visit  Medication Sig Dispense Refill  . acetaminophen (TYLENOL) 500 MG tablet Take 1,000 mg by mouth daily as needed for mild pain, moderate pain, fever or headache.     . allopurinol (ZYLOPRIM) 300 MG tablet take 1 tablet by mouth once daily 90 tablet 3  . amLODipine (NORVASC) 2.5 MG tablet   0  . aspirin EC 325 MG tablet Take 325 mg by mouth daily.    [name] Kitchen atorvastatin (LIPITOR) 20 MG tablet take 1 tablet by mouth once daily 90 tablet 3  . Calcium Carb-Cholecalciferol (CALCIUM 600 + D PO) Take 1 tablet by mouth at bedtime.     [name] Kitchen desonide (DESOWEN) 0.05 % lotion Apply 1 application topically daily as needed (rosacea).     . Ferrous Sulfate (IRON) 325 (65 Fe) MG TABS Take 325 mg by mouth 2 (two) times daily. (Patient taking differently: Take 650 mg by mouth daily. ) 180 each 3  . finasteride (PROSCAR) 5 MG tablet Take 1 tablet (5 mg total) by mouth every morning. 90 tablet 3  . furosemide (LASIX) 40 MG tablet Take 1 tablet (40 mg total) by mouth daily. 90 tablet 1  . JANUVIA 100 MG tablet take 1 tablet by mouth once daily 90 tablet 2  . ketoconazole (NIZORAL) 2 % shampoo Apply 1 application topically 4 (four) times a week.    . mirabegron ER (MYRBETRIQ) 50 MG TB24 tablet Take 50 mg  by mouth daily.    . Multiple Vitamins-Minerals (ONCOVITE PO) Take 1 tablet by mouth daily.     . pantoprazole (PROTONIX) 40 MG tablet take 1 tablet by mouth once daily 90 tablet 3  . potassium chloride (K-DUR,KLOR-CON) 10 MEQ tablet take 1 tablet by mouth once daily 90 tablet 2  . senna (SENOKOT) 8.6 MG tablet Take 1 tablet by mouth at bedtime.     . Tamsulosin HCl (FLOMAX) 0.4 MG CAPS Take 0.4 mg by mouth at bedtime.     . traZODone (DESYREL) 50 MG tablet Take 1 tablet (50 mg total) by mouth See admin instructions. 90 tablet 1  .  warfarin (COUMADIN) 4 MG tablet take as directed BY ANTICOAGULATION CLINIC 105 tablet 1   No current facility-administered medications on file prior to visit.    Review of Systems  Constitutional: Negative for other unusual diaphoresis or sweats HENT: Negative for ear discharge or swelling Eyes: Negative for other worsening visual disturbances Respiratory: Negative for stridor or other swelling  Gastrointestinal: Negative for worsening distension or other blood Genitourinary: Negative for retention or other urinary change Musculoskeletal: Negative for other MSK pain or swelling Skin: Negative for color change or other new lesions Neurological: Negative for worsening tremors and other numbness  Psychiatric/Behavioral: Negative for worsening agitation or other fatigue All other system neg per pt    Objective:   Physical Exam BP 128/84   Pulse 74   Ht 5' 8"  (1.727 m)   Wt 211 lb (95.7 kg)   SpO2 96%   BMI 32.08 kg/m  VS noted, mild weakness, needs mild assist for up to exam table Constitutional: Pt appears in NAD HENT: Head: NCAT.  Right Ear: External ear normal.  Left Ear: External ear normal.  Eyes: . Pupils are equal, round, and reactive to light. Conjunctivae and EOM are normal Nose: without d/c or deformity Neck: Neck supple. Gross normal ROM Cardiovascular: Normal rate and regular rhythm.   Pulmonary/Chest: Effort normal and breath sounds  without rales or wheezing.  Abd:  Soft, NT, ND, + BS, no organomegaly Spine: nontender in the midline throughtout, no paravertebral tender or spasm noted Neurological: Pt is alert. At baseline orientation, motor 5/5 intact, gait somewhat unsteady but no worse Skin: Skin is warm. No rashes, other new lesions, no LE edema Psychiatric: Pt behavior is normal without agitation         Assessment & Plan:

## 2016-07-04 NOTE — Assessment & Plan Note (Signed)
Hx and exam c/w with most likely underlying lumbar djd or ddd pain flare, no neuro changes, declines plain films today, does not need MRI at this time but would consider if pain persists or worsens, ok for trial tramadol, tizanindine and predpac asd, consider PT if not improved or sport med evaluation

## 2016-07-04 NOTE — Progress Notes (Signed)
Pre visit review using our clinic review tool, if applicable. No additional management support is needed unless otherwise documented below in the visit note. 

## 2016-07-04 NOTE — Patient Instructions (Addendum)
Please take all new medication as prescribed - the tramadol for pain, tizanidine as needed for pain/muscles, and prednisone  Please consider using the cane in the home until the pain is improved  Please consider Xray or MRI, as well as seeing Dr Smith/sports medicine in this office for pain that persists or gets worse  Please continue all other medications as before, and refills have been done if requested.  Please have the pharmacy call with any other refills you may need.  Please keep your appointments with your specialists as you may have planned

## 2016-07-04 NOTE — Assessment & Plan Note (Signed)
stable overall by history and exam, recent data reviewed with pt, and pt to continue medical treatment as before,  to f/u any worsening symptoms or concerns Lab Results  Component Value Date   HGBA1C 6.6 (H) 04/10/2015   Pt to call for onset polys or cbg > 200 with steroid tx

## 2016-07-04 NOTE — Assessment & Plan Note (Signed)
stable overall by history and exam, recent data reviewed with pt, and pt to continue medical treatment as before,  to f/u any worsening symptoms or concerns BP Readings from Last 3 Encounters:  07/04/16 128/84  06/12/16 136/88  05/21/16 132/82

## 2016-07-07 ENCOUNTER — Ambulatory Visit (INDEPENDENT_AMBULATORY_CARE_PROVIDER_SITE_OTHER): Payer: Medicare Other | Admitting: Emergency Medicine

## 2016-07-07 ENCOUNTER — Telehealth: Payer: Self-pay | Admitting: Internal Medicine

## 2016-07-07 ENCOUNTER — Encounter: Payer: Self-pay | Admitting: Emergency Medicine

## 2016-07-07 VITALS — BP 111/67 | HR 47 | Temp 97.3°F | Resp 16 | Ht 68.0 in | Wt 210.0 lb

## 2016-07-07 DIAGNOSIS — N390 Urinary tract infection, site not specified: Secondary | ICD-10-CM | POA: Insufficient documentation

## 2016-07-07 DIAGNOSIS — Z7901 Long term (current) use of anticoagulants: Secondary | ICD-10-CM | POA: Diagnosis not present

## 2016-07-07 DIAGNOSIS — M545 Low back pain, unspecified: Secondary | ICD-10-CM

## 2016-07-07 DIAGNOSIS — R319 Hematuria, unspecified: Secondary | ICD-10-CM | POA: Diagnosis not present

## 2016-07-07 LAB — POCT URINALYSIS DIP (MANUAL ENTRY)
Bilirubin, UA: NEGATIVE
GLUCOSE UA: NEGATIVE mg/dL
Ketones, POC UA: NEGATIVE mg/dL
Nitrite, UA: POSITIVE — AB
PH UA: 5.5 (ref 5.0–8.0)
Protein Ur, POC: 100 mg/dL — AB
Spec Grav, UA: 1.025 (ref 1.010–1.025)
UROBILINOGEN UA: 1 U/dL

## 2016-07-07 MED ORDER — CEFADROXIL 500 MG PO CAPS: 500.0000 mg | ORAL_CAPSULE | Freq: Two times a day (BID) | ORAL | 0 refills | Status: AC

## 2016-07-07 NOTE — Progress Notes (Signed)
[name] Kitchen    HEMATOLOGY/ONCOLOGY CLINIC NOTE  Date of Service: 07/07/2016  Patient Care Team: Biagio Borg, MD as PCP - General (Internal Medicine) Carlena Bjornstad, MD (Cardiology) Earnie Larsson, MD (Neurosurgery) Rutherford Guys, MD (Ophthalmology) Renda Rolls, Jennefer Bravo, MD as Referring Physician (Dermatology) Alexis Frock, MD as Consulting Physician (Urology) Dorothy Spark, MD as Consulting Physician (Cardiology)  CHIEF COMPLAINTS/PURPOSE OF CONSULTATION:   Microcytic Anemia  HISTORY OF PRESENTING ILLNESS:   Eric Rivers is a wonderful 83 y.o. male who has been referred to [place] by Dr.John, Hunt Oris, MD for evaluation and management of microcytic Anemia.  Patient is a wonderful retired Radio producer, Art gallery manager with multiple medical comorbidities hypertension, dyslipidemia, atrial fibrillation on Coumadin, diabetes, aortic stenosis, bladder cancer and history of prostate cancer (s/p RT in 2014, followed by Dr Tresa Moore - Radiation Oncology).  Patient has had a history of chronic anemia with labs showed a hemoglobin between 8.4-9 over the last 1 year with microcytic indices and elevated RDW. Patient notes fatigue. Denies any overt bleeding at this time. Previously had hematuria in March 2017 that was worked up by his urologist. Helene Kelp to be on Coumadin for atrial fibrillation. Has been on chronic PPI therapy could affect his iron absorption.  INTERVAL HISTORY  Patient is here for follow-up of his iron deficiency anemia. He notes that he has been tolerating his oral ferrous sulfate 1 tablet by mouth twice a day without any issues. No overt bleeding. Hemoglobin is up from 9.3 now up to 11.1 with resolution of his microcytosis. Ferritin is up to 40 with an iron saturation of 27%. Notes chronic dyspnea on exertion which appears unchanged.  MEDICAL HISTORY:  Past Medical History:  Diagnosis Date  . ACE-inhibitor cough   . Aortic dissection [facility])    Surgical repair February, 2014   . Aortic stenosis    mild....echo... september... 2010/ mild... echo.[name] KitchenMarland KitchenDec, 2011  . Atrial fibrillation (HCC)     Chronic,   24 hour holter, September, 2011.... atrial fib rate is controlled.... there is some bradycardia but  no marked pauses  . Bladder cancer [facility])    recurrence with TUR-B March '09  . DDD (degenerative disc disease)    lumbar spine  . Diabetes mellitus    type 2  . Diabetes mellitus with neuropathy ([place]) 12/04/2006   Qualifier: Diagnosis of  By: Linda Hedges MD, Heinz Knuckles  medications - DPP4   . Diverticulosis of colon with hemorrhage 01/01/2014  . Ejection fraction    EF 60%, echo, 01/2010  . Fluid overload   . Fracture of metacarpal bone 11/30/11  . GERD (gastroesophageal reflux disease)   . Gout   . History of echocardiogram    Echo 5/17 - mild LVH, EF 55-60%, mod AS (mean 16 mmHg, peak 29 mmHg), MAC, midl MR, massive BAE, mod dilated RV, low normal RVSF, mod TR, PASP 64 mmHg  . Hx of colonoscopy    approx. 10 years  . Hypercholesterolemia   . Hyperlipidemia   . Hypertension   . Mitral regurgitation   . Normal nuclear stress test    06/2005 , also ABI normal 2007  . Prostate cancer ([place])    radiation tx.  . Pulmonary hypertension ([place])    83mHg, echo, 01/2010  . Shortness of breath    on exertion  . Warfarin anticoagulation    Atrial fib  . Whooping cough     SURGICAL HISTORY: Past Surgical History:  Procedure Laterality Date  . ASCENDING AORTIC ROOT  REPLACEMENT N/A 04/15/2012   Procedure: ASCENDING AORTIC ROOT REPLACEMENT;  Surgeon: Gaye Pollack, MD;  Location: [place] [facility];  Service: Open Heart Surgery;  Laterality: N/A;  . bladder cancer biopsy  10/16/2010   negative for malignancy  . bladder microscopic  04/13/2007   high grade papillary urothelial lesions  . COLONOSCOPY N/A 01/01/2014   Procedure: COLONOSCOPY;  Surgeon: Gatha Mayer, MD;  [facility];  Service: Endoscopy;  Laterality: N/A;  . CYSTOSCOPY/RETROGRADE/URETEROSCOPY Bilateral  04/04/2013   Procedure: CYSTOSCOPY WITH RETROGRADE PYELOGRAM AND BLADDER BIOPSY;  Surgeon: Molli Hazard, MD;  [facility];  Service: Urology;  Laterality: Bilateral;  . 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 EXPLORATION POST OPERATIVE OPEN HEART  04/2012  . Herniatic disc surgery    . LUMBAR LAMINECTOMY     '02  . PROSTATE BIOPSY  09/25/2011   GLEASON 3+3=6 AND 4+3=7  . TEE WITHOUT CARDIOVERSION N/A 04/15/2012   Procedure: TRANSESOPHAGEAL ECHOCARDIOGRAM (TEE);  Surgeon: Gaye Pollack, MD;  Location: [place];  Service: Open Heart Surgery;  Laterality: N/A;  . TRANSURETHRAL RESECTION OF BLADDER TUMOR N/A 06/13/2015   Procedure: TRANSURETHRAL RESECTION OF PROSTATE (TURP) CLOT EVACUATION AND FULGERATION, BLADDER STONE REMOVAL ;  Surgeon: Alexis Frock, MD;  [facility];  Service: Urology;  Laterality: N/A;  . TUR-BT '06, '09      SOCIAL HISTORY: Social History   Social History  . Marital status: Single    Spouse name: N/A  . Number of children: 0  . Years of education: 42   Occupational History  . history and Advice worker Retired    retired   Social History Main Topics  . Smoking status: Former Smoker    Packs/day: 1.00    Types: Cigarettes    Quit date: 03/04/1975  . Smokeless tobacco: Never Used  . Alcohol use No  . Drug use: No  . Sexual activity: Not Currently   Other Topics Concern  . Not on file   Social History Narrative   Confirmed batchelor. Retired Education officer, museum. World traveler- Ambulance person. I- ADLS. End of life Care   No CPR, no prolonged intubation, no prolonged artificial hydration or feeding, no heroic or futile measures.       Next trip Sept [place]13 [place]      Last cruise - two cruises back to back to the [place] in January '15. May'15: [place] with stops in [place], [place], [place], [place], [place]. 15 days      Fall '15:  50 Days from [place]. [place], [place], [place], [place], [place], [place], [place], [place], [place].       May '15 - 15 days up the [employer] as far as [place]             FAMILY HISTORY: Family History  Problem Relation Age of Onset  . Prostate cancer Father 68    passed with prostate ca  . Cancer Father   . Stroke Father   . Heart disease Mother   . Hypertension Mother   . Heart attack Mother     ALLERGIES:  is allergic to metoprolol.  MEDICATIONS:  Current Outpatient Prescriptions  Medication Sig Dispense Refill  . acetaminophen (TYLENOL) 500 MG tablet Take 1,000 mg by mouth daily as needed for mild pain, moderate pain, fever or headache.     [name] Kitchen aspirin EC 325  MG tablet Take 325 mg by mouth daily.    . Calcium Carb-Cholecalciferol (CALCIUM 600 + D PO) Take 1 tablet by mouth at bedtime.     [name] Kitchen desonide (DESOWEN) 0.05 % lotion Apply 1 application topically daily as needed (rosacea).     . Ferrous Sulfate (IRON) 325 (65 Fe) MG TABS Take 325 mg by mouth 2 (two) times daily. (Patient taking differently: Take 650 mg by mouth daily. ) 180 each 3  . finasteride (PROSCAR) 5 MG tablet Take 1 tablet (5 mg total) by mouth every morning. 90 tablet 3  . furosemide (LASIX) 40 MG tablet Take 1 tablet (40 mg total) by mouth daily. 90 tablet 1  . ketoconazole (NIZORAL) 2 % shampoo Apply 1 application topically 4 (four) times a week.    . mirabegron ER (MYRBETRIQ) 50 MG TB24 tablet Take 50 mg by mouth daily.    . Multiple Vitamins-Minerals (ONCOVITE PO) Take 1 tablet by mouth daily.     [name] Kitchen senna (SENOKOT) 8.6 MG tablet Take 1 tablet by mouth at bedtime.     . Tamsulosin HCl (FLOMAX) 0.4 MG CAPS Take 0.4 mg by mouth at bedtime.     [name] Kitchen allopurinol (ZYLOPRIM) 300 MG tablet take 1 tablet by mouth once daily 90 tablet 3  . amLODipine (NORVASC) 2.5 MG tablet   0  . atorvastatin (LIPITOR) 20 MG tablet take 1 tablet by mouth once daily 90 tablet 3  . JANUVIA 100 MG tablet take 1 tablet by mouth  once daily 90 tablet 2  . pantoprazole (PROTONIX) 40 MG tablet take 1 tablet by mouth once daily 90 tablet 3  . potassium chloride (K-DUR,KLOR-CON) 10 MEQ tablet take 1 tablet by mouth once daily 90 tablet 2  . predniSONE (DELTASONE) 10 MG tablet 3 tabs by mouth per day for 3 days,2tabs per day for 3 days,1tab per day for 3 days 18 tablet 0  . tiZANidine (ZANAFLEX) 4 MG tablet Take 1 tablet (4 mg total) by mouth every 6 (six) hours as needed for muscle spasms. 30 tablet 1  . traMADol (ULTRAM) 50 MG tablet Take 1 tablet (50 mg total) by mouth every 6 (six) hours as needed. 60 tablet 01  . traZODone (DESYREL) 50 MG tablet Take 1 tablet (50 mg total) by mouth See admin instructions. 90 tablet 1  . warfarin (COUMADIN) 4 MG tablet take as directed BY ANTICOAGULATION CLINIC 105 tablet 1   No current facility-administered medications for this visit.     REVIEW OF SYSTEMS:    10 Point review of Systems was done is negative except as noted above.  PHYSICAL EXAMINATION: ECOG PERFORMANCE STATUS: 2 - Symptomatic, <50% confined to bed  . Vitals:   12/10/15 1519  BP: (!) 163/81  Pulse: 76  Resp: 18  Temp: 98.1 F (36.7 C)   Filed Weights   12/10/15 1519  Weight: 219 lb 14.4 oz (99.7 kg)   .Body mass index is 33.44 kg/m.  GENERAL:alert, in no acute distress and comfortable SKIN: no acute rashes EYES: normal, conjunctiva are pink and non-injected, sclera clear OROPHARYNX:mmm NECK: supple, mild JVD LYMPH:  no palpable lymphadenopathy in the cervical, axillary or inguinal LUNGS: minimal basal rales no rhonchi HEART: rirreg, b/l 1+ pedal edema ABDOMEN: abdomen soft, non-tender, normoactive bowel sounds  Musculoskeletal: no cyanosis of digits and no clubbing  PSYCH: alert & oriented x 3 with fluent speech NEURO: no focal motor/sensory deficits  LABORATORY DATA:  I have reviewed the data as listed  Component     Latest Ref Rng & Units 12/10/2015  Sodium     136 - 145 mEq/L 141    Potassium     3.5 - 5.1 mEq/L 4.3  Chloride     98 - 109 mEq/L 105  CO2     22 - 29 mEq/L 27  Glucose     70 - 140 mg/dl 139  BUN     7.0 - 26.0 mg/dL 12.0  Creatinine     0.7 - 1.3 mg/dL 0.9  Total Bilirubin     0.20 - 1.20 mg/dL 0.81  Alkaline Phosphatase     40 - 150 U/L 97  AST     5 - 34 U/L 20  ALT     0 - 55 U/L 13  Total Protein     6.4 - 8.3 g/dL 7.3  Albumin     3.5 - 5.0 g/dL 3.8  Calcium     8.4 - 10.4 mg/dL 9.5  Anion gap     3 - 11 mEq/L 9  EGFR     >90 ml/min/1.73 m2 79 (L)  Iron     42 - 163 ug/dL 99  TIBC     202 - 409 ug/dL 361  UIBC     117 - 376 ug/dL 263  %SAT     20 - 55 % 27  Ferritin     22 - 316 ng/ml 40        RADIOGRAPHIC STUDIES: I have personally reviewed the radiological images as listed and agreed with the findings in the report. No results found.  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 2) Iron deficiency likely related to chronic blood loss Patient on coumadin for afib -- several sources of possible bleeding -hematurea, GI. Has been on PPI which might affect iron absorption too.  Patient is tolerating his increased dose of ferrous sulfate 1 tablet by mouth twice a day. His hgb has improved some from 8.4 to 9.3 to 11.1. Ferritin is imrpoved from 30 to 40  Plan -patient denies any active/overt bleeding at this time. -Continue ferrous sulfate 1 tablet by mouth twice a day with close monitoring with small amounts of orange juice. -Reason to take a daily vitamin B complex  3) low risk IgG lambda MGUS with polyclonal IgA. Patient has M spike of only 0.2 g/dL. Plan -no indication for BM Bx or other extensive w/u at this time in the context of the patients overall health. -recheck SPEP in 6 months with PCP - reconsult [place] if M spike > 1g/dl  RTC with Dr Irene Limbo in 4 months with labs   All of the patients questions  were answered with apparent satisfaction. The patient knows to call the clinic with any problems, questions or concerns.  I spent 20 minutes counseling the patient face to face. The total time spent in the appointment was 20 minutes and more than 50% was on counseling and direct patient cares.    Sullivan Lone MD [place] AAHIVMS [facility] Hematology/Oncology Physician [facility]  (Office):       [phone] (Work cell):  [phone] (Fax):           [phone]

## 2016-07-07 NOTE — Progress Notes (Signed)
[name] Kitchen    HEMATOLOGY/ONCOLOGY CLINIC NOTE  Date of Service: .04/07/2016  Patient Care Team: Biagio Borg, MD as PCP - General (Internal Medicine) Carlena Bjornstad, MD (Cardiology) Earnie Larsson, MD (Neurosurgery) Rutherford Guys, MD (Ophthalmology) Renda Rolls, Jennefer Bravo, MD as Referring Physician (Dermatology) Alexis Frock, MD as Consulting Physician (Urology) Dorothy Spark, MD as Consulting Physician (Cardiology)  CHIEF COMPLAINTS/PURPOSE OF CONSULTATION:   f/u for Iron deficiency Anemia  HISTORY OF PRESENTING ILLNESS:   Eric Rivers is a wonderful 83 y.o. male who has been referred to [place] by Dr.John, Hunt Oris, MD for evaluation and management of microcytic Anemia.  Patient is a wonderful retired Radio producer, Art gallery manager with multiple medical comorbidities hypertension, dyslipidemia, atrial fibrillation on Coumadin, diabetes, aortic stenosis, bladder cancer and history of prostate cancer (s/p RT in 2014, followed by Dr Tresa Moore - Radiation Oncology).  Patient has had a history of chronic anemia with labs showed a hemoglobin between 8.4-9 over the last 1 year with microcytic indices and elevated RDW. Patient notes fatigue. Denies any overt bleeding at this time. Previously had hematuria in March 2017 that was worked up by his urologist. Helene Kelp to be on Coumadin for atrial fibrillation. Has been on chronic PPI therapy could affect his iron absorption.  INTERVAL HISTORY  Patient is here for follow-up of his iron deficiency anemia. He notes he has developed a small rt thigh hematoma which is resolving. He notes that he has been tolerating his oral ferrous sulfate 1 tablet by mouth twice a day without any issues.  Hemoglobin is now up to 12.4 with nl MCV with ferritin up to 74.  MEDICAL HISTORY:  Past Medical History:  Diagnosis Date  . ACE-inhibitor cough   . Aortic dissection [facility])    Surgical repair February, 2014  . Aortic stenosis    mild....echo... september...  2010/ mild... echo.[name] KitchenMarland KitchenDec, 2011  . Atrial fibrillation (HCC)     Chronic,   24 hour holter, September, 2011.... atrial fib rate is controlled.... there is some bradycardia but  no marked pauses  . Bladder cancer [facility])    recurrence with TUR-B March '09  . DDD (degenerative disc disease)    lumbar spine  . Diabetes mellitus    type 2  . Diabetes mellitus with neuropathy ([place]) 12/04/2006   Qualifier: Diagnosis of  By: Linda Hedges MD, Heinz Knuckles  medications - DPP4   . Diverticulosis of colon with hemorrhage 01/01/2014  . Ejection fraction    EF 60%, echo, 01/2010  . Fluid overload   . Fracture of metacarpal bone 11/30/11  . GERD (gastroesophageal reflux disease)   . Gout   . History of echocardiogram    Echo 5/17 - mild LVH, EF 55-60%, mod AS (mean 16 mmHg, peak 29 mmHg), MAC, midl MR, massive BAE, mod dilated RV, low normal RVSF, mod TR, PASP 64 mmHg  . Hx of colonoscopy    approx. 10 years  . Hypercholesterolemia   . Hyperlipidemia   . Hypertension   . Mitral regurgitation   . Normal nuclear stress test    06/2005 , also ABI normal 2007  . Prostate cancer ([place])    radiation tx.  . Pulmonary hypertension ([place])    91mHg, echo, 01/2010  . Shortness of breath    on exertion  . Warfarin anticoagulation    Atrial fib  . Whooping cough     SURGICAL HISTORY: Past Surgical History:  Procedure Laterality Date  . ASCENDING AORTIC ROOT REPLACEMENT N/A 04/15/2012  Procedure: ASCENDING AORTIC ROOT REPLACEMENT;  Surgeon: Gaye Pollack, MD;  Location: [place] OR;  Service: Open Heart Surgery;  Laterality: N/A;  . bladder cancer biopsy  10/16/2010   negative for malignancy  . bladder microscopic  04/13/2007   high grade papillary urothelial lesions  . COLONOSCOPY N/A 01/01/2014   Procedure: COLONOSCOPY;  Surgeon: Gatha Mayer, MD;  Location: [facility];  Service: Endoscopy;  Laterality: N/A;  . CYSTOSCOPY/RETROGRADE/URETEROSCOPY Bilateral 04/04/2013   Procedure: CYSTOSCOPY WITH RETROGRADE  PYELOGRAM AND BLADDER BIOPSY;  Surgeon: Molli Hazard, MD;  Location: [facility];  Service: Urology;  Laterality: Bilateral;  . 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 EXPLORATION POST OPERATIVE OPEN HEART  04/2012  . Herniatic disc surgery    . LUMBAR LAMINECTOMY     '02  . PROSTATE BIOPSY  09/25/2011   GLEASON 3+3=6 AND 4+3=7  . TEE WITHOUT CARDIOVERSION N/A 04/15/2012   Procedure: TRANSESOPHAGEAL ECHOCARDIOGRAM (TEE);  Surgeon: Gaye Pollack, MD;  Location: [place];  Service: Open Heart Surgery;  Laterality: N/A;  . TRANSURETHRAL RESECTION OF BLADDER TUMOR N/A 06/13/2015   Procedure: TRANSURETHRAL RESECTION OF PROSTATE (TURP) CLOT EVACUATION AND FULGERATION, BLADDER STONE REMOVAL ;  Surgeon: Alexis Frock, MD;  Location: [facility];  Service: Urology;  Laterality: N/A;  . TUR-BT '06, '09      SOCIAL HISTORY: Social History   Social History  . Marital status: Single    Spouse name: N/A  . Number of children: 0  . Years of education: 25   Occupational History  . history and Advice worker Retired    retired   Social History Main Topics  . Smoking status: Former Smoker    Packs/day: 1.00    Types: Cigarettes    Quit date: 03/04/1975  . Smokeless tobacco: Never Used  . Alcohol use No  . Drug use: No  . Sexual activity: Not Currently   Other Topics Concern  . Not on file   Social History Narrative   Confirmed batchelor. Retired Education officer, museum. World traveler- Ambulance person. I- ADLS. End of life Care   No CPR, no prolonged intubation, no prolonged artificial hydration or feeding, no heroic or futile measures.       Next [place] Sept [place]13 [place]      Last cruise - two cruises back to back to the [place] in January '15. [place]'15: [place] with stops in [place], [place], [place], [place], [place]. 15 days      Fall '15: 50 Days from [place]. [place], [place], [place],  [place], [place], [place], [place], [place], [place].       May '15 - 15 days up the [employer] as far as [place]             FAMILY HISTORY: Family History  Problem Relation Age of Onset  . Prostate cancer Father 2    passed with prostate ca  . Cancer Father   . Stroke Father   . Heart disease Mother   . Hypertension Mother   . Heart attack Mother     ALLERGIES:  is allergic to metoprolol.  MEDICATIONS:  Current Outpatient Prescriptions  Medication Sig Dispense Refill  . acetaminophen (TYLENOL) 500 MG tablet Take 1,000 mg by mouth daily as needed for mild pain, moderate pain, fever or headache.     . allopurinol (ZYLOPRIM) 300 MG tablet take 1 tablet  by mouth once daily 90 tablet 3  . aspirin EC 325 MG tablet Take 325 mg by mouth daily.    [name] Kitchen atorvastatin (LIPITOR) 20 MG tablet take 1 tablet by mouth once daily 90 tablet 3  . Calcium Carb-Cholecalciferol (CALCIUM 600 + D PO) Take 1 tablet by mouth at bedtime.     [name] Kitchen desonide (DESOWEN) 0.05 % lotion Apply 1 application topically daily as needed (rosacea).     . Ferrous Sulfate (IRON) 325 (65 Fe) MG TABS Take 325 mg by mouth 2 (two) times daily. (Patient taking differently: Take 650 mg by mouth daily. ) 180 each 3  . finasteride (PROSCAR) 5 MG tablet Take 1 tablet (5 mg total) by mouth every morning. 90 tablet 3  . furosemide (LASIX) 40 MG tablet Take 1 tablet (40 mg total) by mouth daily. 90 tablet 1  . ketoconazole (NIZORAL) 2 % shampoo Apply 1 application topically 4 (four) times a week.    . mirabegron ER (MYRBETRIQ) 50 MG TB24 tablet Take 50 mg by mouth daily.    . Multiple Vitamins-Minerals (ONCOVITE PO) Take 1 tablet by mouth daily.     . pantoprazole (PROTONIX) 40 MG tablet take 1 tablet by mouth once daily 90 tablet 3  . senna (SENOKOT) 8.6 MG tablet Take 1 tablet by mouth at bedtime.     . Tamsulosin HCl (FLOMAX) 0.4 MG CAPS Take 0.4 mg by mouth at bedtime.     . traZODone (DESYREL) 50 MG tablet Take 1 tablet (50  mg total) by mouth See admin instructions. 90 tablet 1  . warfarin (COUMADIN) 4 MG tablet take as directed BY ANTICOAGULATION CLINIC 105 tablet 1  . amLODipine (NORVASC) 2.5 MG tablet   0  . JANUVIA 100 MG tablet take 1 tablet by mouth once daily 90 tablet 2  . potassium chloride (K-DUR,KLOR-CON) 10 MEQ tablet take 1 tablet by mouth once daily 90 tablet 2  . predniSONE (DELTASONE) 10 MG tablet 3 tabs by mouth per day for 3 days,2tabs per day for 3 days,1tab per day for 3 days 18 tablet 0  . tiZANidine (ZANAFLEX) 4 MG tablet Take 1 tablet (4 mg total) by mouth every 6 (six) hours as needed for muscle spasms. 30 tablet 1  . traMADol (ULTRAM) 50 MG tablet Take 1 tablet (50 mg total) by mouth every 6 (six) hours as needed. 60 tablet 01   No current facility-administered medications for this visit.     REVIEW OF SYSTEMS:    10 Point review of Systems was done is negative except as noted above.  PHYSICAL EXAMINATION: ECOG PERFORMANCE STATUS: 2 - Symptomatic, <50% confined to bed  . Vitals:   04/07/16 1341  BP: (!) 156/90  Pulse: 70  Resp: 16  Temp: 97.9 F (36.6 C)   Filed Weights   04/07/16 1341  Weight: 209 lb 9.6 oz (95.1 kg)   .Body mass index is 31.87 kg/m.  GENERAL:alert, in no acute distress and comfortable SKIN: no acute rashes EYES: normal, conjunctiva are pink and non-injected, sclera clear OROPHARYNX:mmm NECK: supple, mild JVD LYMPH:  no palpable lymphadenopathy in the cervical, axillary or inguinal LUNGS: minimal basal rales no rhonchi HEART: rirreg, b/l 1+ pedal edema ABDOMEN: abdomen soft, non-tender, normoactive bowel sounds  Musculoskeletal: no cyanosis of digits and no clubbing  PSYCH: alert & oriented x 3 with fluent speech NEURO: no focal motor/sensory deficits  LABORATORY DATA:  I have reviewed the data as listed     Component  Latest Ref Rng & Units 04/07/2016  Sodium     136 - 145 mEq/L 138  Potassium     3.5 - 5.1 mEq/L 4.0  Chloride      98 - 109 mEq/L 102  CO2     22 - 29 mEq/L 27  Glucose     70 - 140 mg/dl 149 (H)  BUN     7.0 - 26.0 mg/dL 12.4  Creatinine     0.7 - 1.3 mg/dL 1.0  Total Bilirubin     0.20 - 1.20 mg/dL 1.49 (H)  Alkaline Phosphatase     40 - 150 U/L 96  AST     5 - 34 U/L 21  ALT     0 - 55 U/L 13  Total Protein     6.4 - 8.3 g/dL 8.0  Albumin     3.5 - 5.0 g/dL 4.2  Calcium     8.4 - 10.4 mg/dL 10.2  Anion gap     3 - 11 mEq/L 9  EGFR     >90 ml/min/1.73 m2 71 (L)  Ferritin     22 - 316 ng/ml 74        RADIOGRAPHIC STUDIES: I have personally reviewed the radiological images as listed and agreed with the findings in the report. No results found.  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 2) Iron deficiency likely related to chronic blood loss Patient on coumadin for afib -- several sources of possible bleeding -hematurea, GI. Has been on PPI which might affect iron absorption too.  Patient is tolerating his increased dose of ferrous sulfate 1 tablet by mouth twice a day. His hgb has improved some from 8.4 to 9.3 to 11.1 to 12.4. Ferritin is imrpoved from 30 to 40 to 74 Plan -patient notes small rt thigh hematoma - resolving -Continue ferrous sulfate 1 tablet by mouth twice a day until ferritin >100 then can switch to once daily for maintainence -Reason to take a daily vitamin B complex  3) low risk IgG lambda MGUS with polyclonal IgA. Patient has M spike of only 0.2 g/dL. No renal failure/focal bone pain/hypercalcemia. Anemia -unrelated to paraproteinemic and  Has resolved with iron replacement. Plan -no indication for BM Bx or other extensive w/u at this time in the context of the patients overall health. -recheck SPEP in June 2018 with PCP and then q6 months and reconsult [place] if M spike > 1g/dl  RTC with Dr Irene Limbo on an as needed basis. Continue f/u with PCP and  cardiology and urology   All of the patients questions were answered with apparent satisfaction. The patient knows to call the clinic with any problems, questions or concerns.  I spent 20 minutes counseling the patient face to face. The total time spent in the appointment was 25 minutes and more than 50% was on counseling and direct patient cares.    Sullivan Lone MD [place] AAHIVMS [facility] Hematology/Oncology Physician [facility]  (Office):       [phone] (Work cell):  [phone] (Fax):           [phone]

## 2016-07-07 NOTE — Progress Notes (Signed)
Eric Rivers 83 y.o.   Chief Complaint  Patient presents with  . Hematuria    X 1 week  . Back Pain    X 1 week    HISTORY OF PRESENT ILLNESS: This is a 83 y.o. male complaining of back pain that started 4 days ago; saw PCP and started on Prednisone, Tramadol, and muscle relaxant; cramping in legs improved; today noticed blood in urine; on Coumadin; advised to be seen today; denies any other significant symptoms; states low back pain much improved.  HPI   Prior to Admission medications   Medication Sig Start Date End Date Taking? Authorizing Provider  acetaminophen (TYLENOL) 500 MG tablet Take 1,000 mg by mouth daily as needed for mild pain, moderate pain, fever or headache.    Yes [provider]  allopurinol (ZYLOPRIM) 300 MG tablet take 1 tablet by mouth once daily 02/12/16  Yes Biagio Borg, MD  amLODipine (NORVASC) 2.5 MG tablet  03/25/16  Yes [provider]  aspirin EC 325 MG tablet Take 325 mg by mouth daily.   Yes [provider]  atorvastatin (LIPITOR) 20 MG tablet take 1 tablet by mouth once daily 02/12/16  Yes Biagio Borg, MD  Calcium Carb-Cholecalciferol (CALCIUM 600 + D PO) Take 1 tablet by mouth at bedtime.    Yes [provider]  desonide (DESOWEN) 0.05 % lotion Apply 1 application topically daily as needed (rosacea).    Yes [provider]  Ferrous Sulfate (IRON) 325 (65 Fe) MG TABS Take 325 mg by mouth 2 (two) times daily. Patient taking differently: Take 650 mg by mouth daily.  10/15/15  Yes Dorothy Spark, MD  finasteride (PROSCAR) 5 MG tablet Take 1 tablet (5 mg total) by mouth every morning. 12/23/13  Yes Biagio Borg, MD  furosemide (LASIX) 40 MG tablet Take 1 tablet (40 mg total) by mouth daily. 11/12/15  Yes Imogene Burn, PA-C  JANUVIA 100 MG tablet take 1 tablet by mouth once daily 06/23/16  Yes Biagio Borg, MD  ketoconazole (NIZORAL) 2 % shampoo Apply 1 application topically 4 (four) times a week.    Yes [provider]  mirabegron ER (MYRBETRIQ) 50 MG TB24 tablet Take 50 mg by mouth daily.   Yes [provider]  Multiple Vitamins-Minerals (ONCOVITE PO) Take 1 tablet by mouth daily.    Yes [provider]  pantoprazole (PROTONIX) 40 MG tablet take 1 tablet by mouth once daily 02/12/16  Yes Biagio Borg, MD  potassium chloride (K-DUR,KLOR-CON) 10 MEQ tablet take 1 tablet by mouth once daily 07/02/16  Yes Dorothy Spark, MD  predniSONE (DELTASONE) 10 MG tablet 3 tabs by mouth per day for 3 days,2tabs per day for 3 days,1tab per day for 3 days 07/04/16  Yes Biagio Borg, MD  senna (SENOKOT) 8.6 MG tablet Take 1 tablet by mouth at bedtime.    Yes [provider]  Tamsulosin HCl (FLOMAX) 0.4 MG CAPS Take 0.4 mg by mouth at bedtime.    Yes [provider]  tiZANidine (ZANAFLEX) 4 MG tablet Take 1 tablet (4 mg total) by mouth every 6 (six) hours as needed for muscle spasms. 07/04/16  Yes Biagio Borg, MD  traMADol (ULTRAM) 50 MG tablet Take 1 tablet (50 mg total) by mouth every 6 (six) hours as needed. 07/04/16  Yes Biagio Borg, MD  traZODone (DESYREL) 50 MG tablet Take 1 tablet (50 mg total) by mouth See admin instructions.  01/30/16  Yes Biagio Borg, MD  warfarin (COUMADIN) 4 MG tablet take as directed [place] 01/07/16  Yes Biagio Borg, MD    Allergies  Allergen Reactions  . Metoprolol Other (See Comments)    Marked bradycardia at 25 mg bid    Patient Active Problem List   Diagnosis Date Noted  . Low back pain 07/04/2016  . Left-sided nosebleed 06/12/2016  . Epistaxis 04/11/2016  . Degenerative arthritis of right knee 09/20/2015  . Pronation deformity of both feet 09/20/2015  . Acute on chronic diastolic heart failure ([place]) 09/19/2015  . Dyspnea 09/07/2015  . Peripheral edema 09/07/2015  . Right knee pain 09/07/2015  . Hematuria 06/13/2015  . AKI (acute kidney injury) ([place]) 06/02/2015  . Elevated INR 06/01/2015  .  Anemia 06/01/2015  . Hemorrhagic cystitis 06/01/2015  . Cough 04/20/2014  . Bradycardia 01/06/2014  . Diverticulosis of colon with hemorrhage 01/01/2014  . Perianal dermatitis 01/01/2014  . Chronic anemia 12/29/2013  . Lower GI bleed 12/29/2013  . Warfarin-induced coagulopathy ([place]) 12/29/2013  . Acute blood loss anemia 12/29/2013  . Chronic diastolic CHF (congestive heart failure) ([place]) 11/02/2013  . Syncope 07/19/2013  . Advanced care planning/counseling discussion 04/12/2013  . Encounter for therapeutic drug monitoring 04/12/2013  . Insomnia 04/11/2013  . Mitral regurgitation   . Ascending aortic dissection ([place]) 04/16/2012  . Malignant neoplasm of prostate ([place]) 10/27/2011  . Warfarin anticoagulation   . Hypertension   . GERD (gastroesophageal reflux disease)   . Aortic stenosis   . Pulmonary hypertension ([place])   . Hyperlipidemia   . Ejection fraction   . Normal nuclear stress test   . Routine general medical examination at a health care facility 08/22/2010  . Bladder cancer ([place])   . Gout   . Chronic atrial fibrillation ([employer])   . HYPERTROPHY PROSTATE W/O UR OBST & OTH LUTS 09/07/2007  . DEGENERATIVE JOINT DISEASE, GENERALIZED 09/07/2007  . NEOP, MALIGNANT, BLADDER NEC 12/04/2006  . Diabetes mellitus with neuropathy ([place]) 12/04/2006    Past Medical History:  Diagnosis Date  . ACE-inhibitor cough   . Aortic dissection [facility])    Surgical repair February, 2014  . Aortic stenosis    mild....echo... september... 2010/ mild... echo.[name] KitchenMarland KitchenDec, 2011  . Atrial fibrillation ([facility])     Chronic,   24 hour holter, September, 2011.... atrial fib rate is controlled.... there is some bradycardia but  no marked pauses  . Bladder cancer [facility])    recurrence with TUR-B March '09  . DDD (degenerative disc disease)    lumbar spine  . Diabetes mellitus    type 2  . Diabetes mellitus with neuropathy ([place]) 12/04/2006   Qualifier: Diagnosis of  By: Linda Hedges MD, Heinz Knuckles  medications - DPP4   .  Diverticulosis of colon with hemorrhage 01/01/2014  . Ejection fraction    EF 60%, echo, 01/2010  . Fluid overload   . Fracture of metacarpal bone 11/30/11  . GERD (gastroesophageal reflux disease)   . Gout   . History of echocardiogram    Echo 5/17 - mild LVH, EF 55-60%, mod AS (mean 16 mmHg, peak 29 mmHg), MAC, midl MR, massive BAE, mod dilated RV, low normal RVSF, mod TR, PASP 64 mmHg  . Hx of colonoscopy    approx. 10 years  . Hypercholesterolemia   . Hyperlipidemia   . Hypertension   . Mitral regurgitation   . Normal nuclear stress test    06/2005 , also ABI normal 2007  .  Prostate cancer ([place])    radiation tx.  . Pulmonary hypertension ([place])    67mHg, echo, 01/2010  . Shortness of breath    on exertion  . Warfarin anticoagulation    Atrial fib  . Whooping cough     Past Surgical History:  Procedure Laterality Date  . ASCENDING AORTIC ROOT REPLACEMENT N/A 04/15/2012   Procedure: ASCENDING AORTIC ROOT REPLACEMENT;  Surgeon: BGaye Pollack MD;  [facility]  Service: Open Heart Surgery;  Laterality: N/A;  . bladder cancer biopsy  10/16/2010   negative for malignancy  . bladder microscopic  04/13/2007   high grade papillary urothelial lesions  . COLONOSCOPY N/A 01/01/2014   Procedure: COLONOSCOPY;  Surgeon: CGatha Mayer MD;  Location: [facility];  Service: Endoscopy;  Laterality: N/A;  . CYSTOSCOPY/RETROGRADE/URETEROSCOPY Bilateral 04/04/2013   Procedure: CYSTOSCOPY WITH RETROGRADE PYELOGRAM AND BLADDER BIOPSY;  Surgeon: DMolli Hazard MD;  Location: [facility];  Service: Urology;  Laterality: Bilateral;  . 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 EXPLORATION POST OPERATIVE OPEN HEART  04/2012  . Herniatic disc surgery    . LUMBAR LAMINECTOMY     '02  . PROSTATE BIOPSY  09/25/2011   GLEASON 3+3=6 AND 4+3=7  . TEE WITHOUT CARDIOVERSION N/A 04/15/2012   Procedure: TRANSESOPHAGEAL ECHOCARDIOGRAM (TEE);  Surgeon: BGaye Pollack MD;   Location: [facility]  Service: Open Heart Surgery;  Laterality: N/A;  . TRANSURETHRAL RESECTION OF BLADDER TUMOR N/A 06/13/2015   Procedure: TRANSURETHRAL RESECTION OF PROSTATE (TURP) CLOT EVACUATION AND FULGERATION, BLADDER STONE REMOVAL ;  Surgeon: TAlexis Frock MD;  Location[facility];  Service: Urology;  Laterality: N/A;  . TUR-BT '06, '09      Social History   Social History  . Marital status: Single    Spouse name: N/A  . Number of children: 0  . Years of education: 138  Occupational History  . history and lAdvice workerRetired    retired   Social History Main Topics  . Smoking status: Former Smoker    Packs/day: 1.00    Types: Cigarettes    Quit date: 03/04/1975  . Smokeless tobacco: Never Used  . Alcohol use No  . Drug use: No  . Sexual activity: Not Currently   Other Topics Concern  . Not on file   Social History Narrative   Confirmed batchelor. 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 End of life Care   No CPR, no prolonged intubation, no prolonged artificial hydration or feeding, no heroic or futile measures.       [place] Sept [place]13 [place]      [place]. [place]      [place]15[place]       May '15 [place]            Family History  Problem Relation Age of Onset  . Prostate cancer Father 73   passed with prostate ca  . Cancer Father   . Stroke Father   . Heart disease Mother   . Hypertension Mother   . Heart attack  Mother      Review of Systems  Constitutional: Negative.  Negative for chills and fever.  HENT: Negative.   Eyes: Negative.   Respiratory: Negative.  Negative  for cough and shortness of breath.   Cardiovascular: Negative for chest pain, palpitations and leg swelling.  Gastrointestinal: Negative for abdominal pain, diarrhea, nausea and vomiting.  Genitourinary: Positive for hematuria.  Musculoskeletal: Positive for back pain.  Skin: Negative for rash.  Neurological: Negative for dizziness and headaches.  Endo/Heme/Allergies: Bruises/bleeds easily.  All other systems reviewed and are negative.  Recent Results (from the past 2160 hour(s))  POCT INR     Status: None   Collection Time: 04/11/16 12:00 AM  Result Value Ref Range   INR 2.4   POCT INR     Status: None   Collection Time: 05/19/16 12:00 AM  Result Value Ref Range   INR 2.4   Protime-INR     Status: Abnormal   Collection Time: 06/12/16  5:20 PM  Result Value Ref Range   INR 3.0 (H) 0.8 - 1.0 ratio   Prothrombin Time 32.5 (H) 9.6 - 13.1 sec  CBC with Differential/Platelet     Status: Abnormal   Collection Time: 06/12/16  5:20 PM  Result Value Ref Range   WBC 9.2 4.0 - 10.5 K/uL   RBC 3.99 (L) 4.22 - 5.81 Mil/uL   Hemoglobin 12.1 (L) 13.0 - 17.0 g/dL   HCT 37.8 (L) 39.0 - 52.0 %   MCV 94.8 78.0 - 100.0 fl   MCHC 31.9 30.0 - 36.0 g/dL   RDW 16.8 (H) 11.5 - 15.5 %   Platelets 206.0 150.0 - 400.0 K/uL   Neutrophils Relative % 80.1 (H) 43.0 - 77.0 %   Lymphocytes Relative 10.6 (L) 12.0 - 46.0 %   Monocytes Relative 7.3 3.0 - 12.0 %   Eosinophils Relative 1.5 0.0 - 5.0 %   Basophils Relative 0.5 0.0 - 3.0 %   Neutro Abs 7.3 1.4 - 7.7 K/uL   Lymphs Abs 1.0 0.7 - 4.0 K/uL   Monocytes Absolute 0.7 0.1 - 1.0 K/uL   Eosinophils Absolute 0.1 0.0 - 0.7 K/uL   Basophils Absolute 0.0 0.0 - 0.1 K/uL  POCT INR     Status: None   Collection Time: 06/30/16 12:00 AM  Result Value Ref Range   INR 2.8   POCT urinalysis dipstick     Status: Abnormal   Collection Time: 07/07/16  5:32 PM  Result Value Ref Range   Color, UA brown (A) yellow   Clarity, UA cloudy (A) clear   Glucose, UA  negative negative mg/dL   Bilirubin, UA negative negative   Ketones, POC UA negative negative mg/dL   Spec Grav, UA 1.025 1.010 - 1.025   Blood, UA large (A) negative   pH, UA 5.5 5.0 - 8.0   Protein Ur, POC =100 (A) negative mg/dL   Urobilinogen, UA 1.0 0.2 or 1.0 E.U./dL   Nitrite, UA Positive (A) Negative   Leukocytes, UA Small (1+) (A) Negative    Vitals:   07/07/16 1700  BP: 111/67  Pulse: (!) 47  Resp: 16  Temp: 97.3 F (36.3 C)     Physical Exam  Constitutional: He is oriented to person, place, and time. He appears well-developed and well-nourished.  HENT:  Head: Normocephalic and atraumatic.  Mouth/Throat: Oropharynx is clear and moist.  Eyes: Conjunctivae and EOM are normal. Pupils are equal, round, and reactive to light.  Neck: Normal range of motion.  Neck supple.  Cardiovascular: Bradycardia present.   Murmur (2/6) heard. Pulmonary/Chest: Effort normal and breath sounds normal.  Abdominal: Soft. Bowel sounds are normal. He exhibits no distension. There is no tenderness.  Musculoskeletal:       Lumbar back: He exhibits no tenderness, no bony tenderness, no spasm and normal pulse.  Neurological: He is alert and oriented to person, place, and time. No sensory deficit. He exhibits normal muscle tone.  Skin: Skin is warm and dry. Capillary refill takes less than 2 seconds.  Psychiatric: He has a normal mood and affect. His behavior is normal.  Vitals reviewed.    ASSESSMENT & PLAN: Eric Rivers was seen today for hematuria and back pain.  Diagnoses and all orders for this visit:  Hematuria, unspecified type -     POCT urinalysis dipstick -     CBC with Differential/Platelet -     Comprehensive metabolic panel -     Protime-INR -     Urine culture  Long term current use of anticoagulant therapy -     Protime-INR  Bilateral low back pain without sciatica, unspecified chronicity  Acute UTI  Other orders -     cefadroxil (DURICEF) 500 MG capsule; Take 1  capsule (500 mg total) by mouth 2 (two) times daily.    Patient Instructions     Hematuria, Adult Hematuria is blood in your urine. It can be caused by a bladder infection, kidney infection, prostate infection, kidney stone, or cancer of your urinary tract. Infections can usually be treated with medicine, and a kidney stone usually will pass through your urine. If neither of these is the cause of your hematuria, further workup to find out the reason may be needed. It is very important that you tell your health care provider about any blood you see in your urine, even if the blood stops without treatment or happens without causing pain. Blood in your urine that happens and then stops and then happens again can be a symptom of a very serious condition. Also, pain is not a symptom in the initial stages of many urinary cancers. Follow these instructions at home:  Drink lots of fluid, 3-4 quarts a day. If you have been diagnosed with an infection, cranberry juice is especially recommended, in addition to large amounts of water.  Avoid caffeine, tea, and carbonated beverages because they tend to irritate the bladder.  Avoid alcohol because it may irritate the prostate.  Take all medicines as directed by your health care provider.  If you were prescribed an antibiotic medicine, finish it all even if you start to feel better.  If you have been diagnosed with a kidney stone, follow your health care provider's instructions regarding straining your urine to catch the stone.  Empty your bladder often. Avoid holding urine for long periods of time.  After a bowel movement, women should cleanse front to back. Use each tissue only once.  Empty your bladder before and after sexual intercourse if you are a male. Contact a health care provider if:  You develop back pain.  You have a fever.  You have a feeling of sickness in your stomach (nausea) or vomiting.  Your symptoms are not better in 3  days. Return sooner if you are getting worse. Get help right away if:  You develop severe vomiting and are unable to keep the medicine down.  You develop severe back or abdominal pain despite taking your medicines.  You begin passing a large amount of blood  or clots in your urine.  You feel extremely weak or faint, or you pass out. This information is not intended to replace advice given to you by your health care provider. Make sure you discuss any questions you have with your health care provider. Document Released: 02/17/2005 Document Revised: 07/26/2015 Document Reviewed: 10/18/2012 Elsevier Interactive Patient Education  2017 [employer].    IF you received an x-ray today, you will receive an invoice from [facility]. Please contact [facility] at [phone] with questions or concerns regarding your invoice.   IF you received labwork today, you will receive an invoice from [facility]. Please contact [facility] at [phone] with questions or concerns regarding your invoice.   Our billing staff will not be able to assist you with questions regarding bills from these companies.  You will be contacted with the lab results as soon as they are available. The fastest way to get your results is to activate your My Chart account. Instructions are located on the last page of this paperwork. If you have not heard from [place] regarding the results in 2 weeks, please contact this office.          Agustina Caroli, MD [facility]

## 2016-07-07 NOTE — Telephone Encounter (Signed)
[facility]  Patient Name: Eric Rivers  DOB: [birth year]    Initial Comment caller states he is urinating blood, after he started his medication for his back pain.   Nurse Assessment  Nurse: Harlow Mares, RN, Suanne Marker Date/Time (Eastern Time): 07/07/2016 3:48:57 PM  Confirm and document reason for call. If symptomatic, describe symptoms. ---caller states he is urinating blood, after he started his medication for his back pain. Went to [facility] last Friday was prednisone, tramadol and Trizanidine. Lower back pain was the issue. Bleeding began on Sat.  Does the patient have any new or worsening symptoms? ---Yes  Will a triage be completed? ---Yes  Related visit to physician within the last 2 weeks? ---Yes  Does the PT have any chronic conditions? (i.e. diabetes, asthma, etc.) ---Yes  List chronic conditions. ---hx bladder cancerous tumors; cancer or prostate; diabetes; CHF; HTN  Is this a behavioral health or substance abuse call? ---No     Guidelines    Guideline Title Affirmed Question Affirmed Notes  Urine - Blood In Taking Coumadin (warfarin) or other strong blood thinner, or known bleeding disorder (e.g., thrombocytopenia)    Final Disposition User   See Physician within 4 Hours (or PCP triage) Harlow Mares, RN, [place]    Referrals  [facility]   Disagree/Comply: Comply

## 2016-07-07 NOTE — Patient Instructions (Addendum)
   Hematuria, Adult Hematuria is blood in your urine. It can be caused by a bladder infection, kidney infection, prostate infection, kidney stone, or cancer of your urinary tract. Infections can usually be treated with medicine, and a kidney stone usually will pass through your urine. If neither of these is the cause of your hematuria, further workup to find out the reason may be needed. It is very important that you tell your health care provider about any blood you see in your urine, even if the blood stops without treatment or happens without causing pain. Blood in your urine that happens and then stops and then happens again can be a symptom of a very serious condition. Also, pain is not a symptom in the initial stages of many urinary cancers. Follow these instructions at home:  Drink lots of fluid, 3-4 quarts a day. If you have been diagnosed with an infection, cranberry juice is especially recommended, in addition to large amounts of water.  Avoid caffeine, tea, and carbonated beverages because they tend to irritate the bladder.  Avoid alcohol because it may irritate the prostate.  Take all medicines as directed by your health care provider.  If you were prescribed an antibiotic medicine, finish it all even if you start to feel better.  If you have been diagnosed with a kidney stone, follow your health care provider's instructions regarding straining your urine to catch the stone.  Empty your bladder often. Avoid holding urine for long periods of time.  After a bowel movement, women should cleanse front to back. Use each tissue only once.  Empty your bladder before and after sexual intercourse if you are a male. Contact a health care provider if:  You develop back pain.  You have a fever.  You have a feeling of sickness in your stomach (nausea) or vomiting.  Your symptoms are not better in 3 days. Return sooner if you are getting worse. Get help right away if:  You develop  severe vomiting and are unable to keep the medicine down.  You develop severe back or abdominal pain despite taking your medicines.  You begin passing a large amount of blood or clots in your urine.  You feel extremely weak or faint, or you pass out. This information is not intended to replace advice given to you by your health care provider. Make sure you discuss any questions you have with your health care provider. Document Released: 02/17/2005 Document Revised: 07/26/2015 Document Reviewed: 10/18/2012 Elsevier Interactive Patient Education  2017 [employer].    IF you received an x-ray today, you will receive an invoice from [facility]. Please contact [facility] at [phone] with questions or concerns regarding your invoice.   IF you received labwork today, you will receive an invoice from [facility]. Please contact [facility] at [phone] with questions or concerns regarding your invoice.   Our billing staff will not be able to assist you with questions regarding bills from these companies.  You will be contacted with the lab results as soon as they are available. The fastest way to get your results is to activate your My Chart account. Instructions are located on the last page of this paperwork. If you have not heard from [place] regarding the results in 2 weeks, please contact this office.

## 2016-07-08 ENCOUNTER — Ambulatory Visit (INDEPENDENT_AMBULATORY_CARE_PROVIDER_SITE_OTHER): Payer: Medicare Other | Admitting: Internal Medicine

## 2016-07-08 ENCOUNTER — Encounter: Payer: Self-pay | Admitting: Internal Medicine

## 2016-07-08 VITALS — BP 126/76 | HR 67 | Ht 68.0 in | Wt 209.0 lb

## 2016-07-08 DIAGNOSIS — N390 Urinary tract infection, site not specified: Secondary | ICD-10-CM

## 2016-07-08 DIAGNOSIS — R319 Hematuria, unspecified: Secondary | ICD-10-CM

## 2016-07-08 DIAGNOSIS — I10 Essential (primary) hypertension: Secondary | ICD-10-CM | POA: Diagnosis not present

## 2016-07-08 DIAGNOSIS — Z7901 Long term (current) use of anticoagulants: Secondary | ICD-10-CM | POA: Diagnosis not present

## 2016-07-08 DIAGNOSIS — N3091 Cystitis, unspecified with hematuria: Secondary | ICD-10-CM | POA: Diagnosis not present

## 2016-07-08 DIAGNOSIS — M545 Low back pain: Secondary | ICD-10-CM

## 2016-07-08 LAB — COMPREHENSIVE METABOLIC PANEL
A/G RATIO: 1.6 (ref 1.2–2.2)
ALBUMIN: 4.4 g/dL (ref 3.5–4.7)
ALK PHOS: 80 IU/L (ref 39–117)
ALT: 16 IU/L (ref 0–44)
AST: 25 IU/L (ref 0–40)
BILIRUBIN TOTAL: 0.9 mg/dL (ref 0.0–1.2)
BUN / CREAT RATIO: 22 (ref 10–24)
BUN: 21 mg/dL (ref 8–27)
CHLORIDE: 96 mmol/L (ref 96–106)
CO2: 23 mmol/L (ref 18–29)
Calcium: 9.6 mg/dL (ref 8.6–10.2)
Creatinine, Ser: 0.94 mg/dL (ref 0.76–1.27)
GFR calc non Af Amer: 75 mL/min/{1.73_m2} (ref >59)
GFR, EST AFRICAN AMERICAN: 86 mL/min/{1.73_m2} (ref >59)
GLOBULIN, TOTAL: 2.8 g/dL (ref 1.5–4.5)
Glucose: 115 mg/dL — ABNORMAL HIGH (ref 65–99)
POTASSIUM: 4.2 mmol/L (ref 3.5–5.2)
SODIUM: 139 mmol/L (ref 134–144)
TOTAL PROTEIN: 7.2 g/dL (ref 6.0–8.5)

## 2016-07-08 LAB — CBC WITH DIFFERENTIAL/PLATELET
BASOS: 0 %
Basophils Absolute: 0 10*3/uL (ref 0.0–0.2)
EOS (ABSOLUTE): 0 10*3/uL (ref 0.0–0.4)
EOS: 0 %
HEMOGLOBIN: 11.8 g/dL — AB (ref 13.0–17.7)
Hematocrit: 36.2 % — ABNORMAL LOW (ref 37.5–51.0)
Immature Grans (Abs): 0.1 10*3/uL (ref 0.0–0.1)
Immature Granulocytes: 1 %
Lymphocytes Absolute: 0.5 10*3/uL — ABNORMAL LOW (ref 0.7–3.1)
Lymphs: 4 %
MCH: 30.6 pg (ref 26.6–33.0)
MCHC: 32.6 g/dL (ref 31.5–35.7)
MCV: 94 fL (ref 79–97)
MONOCYTES: 6 %
MONOS ABS: 0.7 10*3/uL (ref 0.1–0.9)
NEUTROS PCT: 89 %
Neutrophils Absolute: 10.8 10*3/uL — ABNORMAL HIGH (ref 1.4–7.0)
Platelets: 230 10*3/uL (ref 150–379)
RBC: 3.85 x10E6/uL — ABNORMAL LOW (ref 4.14–5.80)
RDW: 16.7 % — ABNORMAL HIGH (ref 12.3–15.4)
WBC: 12.1 10*3/uL — ABNORMAL HIGH (ref 3.4–10.8)

## 2016-07-08 LAB — PROTIME-INR
INR: 3 — AB (ref 0.8–1.2)
Prothrombin Time: 29.5 s — ABNORMAL HIGH (ref 9.1–12.0)

## 2016-07-08 LAB — URINE CULTURE: Organism ID, Bacteria: NO GROWTH

## 2016-07-08 MED ORDER — CEFTRIAXONE SODIUM 1 G IJ SOLR
1.0000 g | Freq: Once | INTRAMUSCULAR | Status: AC
  Administered 2016-07-08: 1 g via INTRAMUSCULAR

## 2016-07-08 NOTE — Progress Notes (Signed)
Subjective:    Patient ID: Eric Rivers, male    DOB: 1930-09-18, 83 y.o.   MRN: [id number]  HPI  Here to f/u, here wit some memory difficulty regarding recent events, was seen 5/4 with LBP improved with muscle relaxer and prednisone, pain resolved as long as he was just sitting; last thur thinks he called his urologist who suggested he f/u with primary care given recent LBP. Seen at [facility] with new onset hematuria and pyuria on coumadin, INR 3.0; abnormal UA, urine cx obtained and pending, also with very mild leukocytosis and anemia. Normocytic. cmet without acute, glc 115, Tx with duricef rx sent to the pharmacy but had none in stock, was ordered and then planned to arrive at his house about 1PM today.  Overnight has not developed worsening dysuria, freq though was significant with clots last Sunday may 6. Denies urinary symptoms such as urgency, flank pain or n/v, fever, chills, and has no further clots since 5/6 though still some discolored.. Pt denies chest pain, increased sob or doe, wheezing, orthopnea, PND, increased LE swelling, palpitations, dizziness or syncope.   Has not had large bleeding since yesterday on urination. Past Medical History:  Diagnosis Date  . ACE-inhibitor cough   . Aortic dissection [facility])    Surgical repair February, 2014  . Aortic stenosis    mild....echo... september... 2010/ mild... echo.[name] KitchenMarland KitchenDec, 2011  . Atrial fibrillation (HCC)     Chronic,   24 hour holter, September, 2011.... atrial fib rate is controlled.... there is some bradycardia but  no marked pauses  . Bladder cancer [facility])    recurrence with TUR-B March '09  . DDD (degenerative disc disease)    lumbar spine  . Diabetes mellitus    type 2  . Diabetes mellitus with neuropathy ([place]) 12/04/2006   Qualifier: Diagnosis of  By: Linda Hedges MD, Heinz Knuckles  medications - DPP4   . Diverticulosis of colon with hemorrhage 01/01/2014  . Ejection fraction    EF 60%, echo, 01/2010  . Fluid overload   . Fracture of  metacarpal bone 11/30/11  . GERD (gastroesophageal reflux disease)   . Gout   . History of echocardiogram    Echo 5/17 - mild LVH, EF 55-60%, mod AS (mean 16 mmHg, peak 29 mmHg), MAC, midl MR, massive BAE, mod dilated RV, low normal RVSF, mod TR, PASP 64 mmHg  . Hx of colonoscopy    approx. 10 years  . Hypercholesterolemia   . Hyperlipidemia   . Hypertension   . Mitral regurgitation   . Normal nuclear stress test    06/2005 , also ABI normal 2007  . Prostate cancer ([place])    radiation tx.  . Pulmonary hypertension ([place])    29mHg, echo, 01/2010  . Shortness of breath    on exertion  . Warfarin anticoagulation    Atrial fib  . Whooping cough    Past Surgical History:  Procedure Laterality Date  . ASCENDING AORTIC ROOT REPLACEMENT N/A 04/15/2012   Procedure: ASCENDING AORTIC ROOT REPLACEMENT;  Surgeon: BGaye Pollack MD;  Location[place] [facility]  Service: Open Heart Surgery;  Laterality: N/A;  . bladder cancer biopsy  10/16/2010   negative for malignancy  . bladder microscopic  04/13/2007   high grade papillary urothelial lesions  . COLONOSCOPY N/A 01/01/2014   Procedure: COLONOSCOPY;  Surgeon: CGatha Mayer MD;  Location: [facility];  Service: Endoscopy;  Laterality: N/A;  . CYSTOSCOPY/RETROGRADE/URETEROSCOPY Bilateral 04/04/2013   Procedure: CYSTOSCOPY WITH RETROGRADE PYELOGRAM AND  BLADDER BIOPSY;  Surgeon: Molli Hazard, MD;  Location: [facility];  Service: Urology;  Laterality: Bilateral;  . 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 EXPLORATION POST OPERATIVE OPEN HEART  04/2012  . Herniatic disc surgery    . LUMBAR LAMINECTOMY     '02  . PROSTATE BIOPSY  09/25/2011   GLEASON 3+3=6 AND 4+3=7  . TEE WITHOUT CARDIOVERSION N/A 04/15/2012   Procedure: TRANSESOPHAGEAL ECHOCARDIOGRAM (TEE);  Surgeon: Gaye Pollack, MD;  Location: [place];  Service: Open Heart Surgery;  Laterality: N/A;  . TRANSURETHRAL RESECTION OF BLADDER TUMOR N/A 06/13/2015    Procedure: TRANSURETHRAL RESECTION OF PROSTATE (TURP) CLOT EVACUATION AND FULGERATION, BLADDER STONE REMOVAL ;  Surgeon: Alexis Frock, MD;  Location: [facility];  Service: Urology;  Laterality: N/A;  . TUR-BT '06, '09      reports that he quit smoking about 41 years ago. His smoking use included Cigarettes. He smoked 1.00 pack per day. He has never used smokeless tobacco. He reports that he does not drink alcohol or use drugs. family history includes Cancer in his father; Heart attack in his mother; Heart disease in his mother; Hypertension in his mother; Prostate cancer (age of onset: 38) in his father; Stroke in his father. Allergies  Allergen Reactions  . Metoprolol Other (See Comments)    Marked bradycardia at 25 mg bid   Current Outpatient Prescriptions on File Prior to Visit  Medication Sig Dispense Refill  . acetaminophen (TYLENOL) 500 MG tablet Take 1,000 mg by mouth daily as needed for mild pain, moderate pain, fever or headache.     . allopurinol (ZYLOPRIM) 300 MG tablet take 1 tablet by mouth once daily 90 tablet 3  . amLODipine (NORVASC) 2.5 MG tablet   0  . aspirin EC 325 MG tablet Take 325 mg by mouth daily.    [name] Kitchen atorvastatin (LIPITOR) 20 MG tablet take 1 tablet by mouth once daily 90 tablet 3  . Calcium Carb-Cholecalciferol (CALCIUM 600 + D PO) Take 1 tablet by mouth at bedtime.     . cefadroxil (DURICEF) 500 MG capsule Take 1 capsule (500 mg total) by mouth 2 (two) times daily. 14 capsule 0  . desonide (DESOWEN) 0.05 % lotion Apply 1 application topically daily as needed (rosacea).     . Ferrous Sulfate (IRON) 325 (65 Fe) MG TABS Take 325 mg by mouth 2 (two) times daily. (Patient taking differently: Take 650 mg by mouth daily. ) 180 each 3  . finasteride (PROSCAR) 5 MG tablet Take 1 tablet (5 mg total) by mouth every morning. 90 tablet 3  . furosemide (LASIX) 40 MG tablet Take 1 tablet (40 mg total) by mouth daily. 90 tablet 1  . JANUVIA 100 MG tablet take 1 tablet by mouth once  daily 90 tablet 2  . ketoconazole (NIZORAL) 2 % shampoo Apply 1 application topically 4 (four) times a week.    . mirabegron ER (MYRBETRIQ) 50 MG TB24 tablet Take 50 mg by mouth daily.    . Multiple Vitamins-Minerals (ONCOVITE PO) Take 1 tablet by mouth daily.     . pantoprazole (PROTONIX) 40 MG tablet take 1 tablet by mouth once daily 90 tablet 3  . potassium chloride (K-DUR,KLOR-CON) 10 MEQ tablet take 1 tablet by mouth once daily 90 tablet 2  . predniSONE (DELTASONE) 10 MG tablet 3 tabs by mouth per day for 3 days,2tabs per day for 3 days,1tab per day for 3 days 18  tablet 0  . senna (SENOKOT) 8.6 MG tablet Take 1 tablet by mouth at bedtime.     . Tamsulosin HCl (FLOMAX) 0.4 MG CAPS Take 0.4 mg by mouth at bedtime.     [name] Kitchen tiZANidine (ZANAFLEX) 4 MG tablet Take 1 tablet (4 mg total) by mouth every 6 (six) hours as needed for muscle spasms. 30 tablet 1  . traMADol (ULTRAM) 50 MG tablet Take 1 tablet (50 mg total) by mouth every 6 (six) hours as needed. 60 tablet 01  . traZODone (DESYREL) 50 MG tablet Take 1 tablet (50 mg total) by mouth See admin instructions. 90 tablet 1  . warfarin (COUMADIN) 4 MG tablet take as directed BY ANTICOAGULATION CLINIC 105 tablet 1   No current facility-administered medications on file prior to visit.    Review of Systems  Constitutional: Negative for other unusual diaphoresis or sweats HENT: Negative for ear discharge or swelling Eyes: Negative for other worsening visual disturbances Respiratory: Negative for stridor or other swelling  Gastrointestinal: Negative for worsening distension or other blood Genitourinary: Negative for retention or other urinary change Musculoskeletal: Negative for other MSK pain or swelling Skin: Negative for color change or other new lesions Neurological: Negative for worsening tremors and other numbness  Psychiatric/Behavioral: Negative for worsening agitation or other fatigue     Objective:   Physical Exam  BP 126/76   Pulse  67   Ht _0  (1.727 m)   Wt 209 lb (94.8 kg)   SpO2 98%   BMI 31.78 kg/m  VS noted, mild fatigued but nontoxic, needs assist with standing up and getting up on exam table Constitutional: Pt appears in NAD HENT: Head: NCAT.  Right Ear: External ear normal.  Left Ear: External ear normal.  Eyes: . Pupils are equal, round, and reactive to light. Conjunctivae and EOM are normal Nose: without d/c or deformity Neck: Neck supple. Gross normal ROM Cardiovascular: Normal rate and regular rhythm.   Pulmonary/Chest: Effort normal and breath sounds without rales or wheezing.  Abd:  Soft, NT, ND, + BS, no organomegaly - benign exam, no flank tender Spine: nontender in the midline or  Neurological: Pt is alert. At baseline orientation but has some confusion related to recent event details, motor grossly intact Skin: Skin is warm. No rashes, other new lesions, no LE edema Psychiatric: Pt behavior is normal without agitation  No other exam findings  POCT urinalysis dipstick  Order: [id number]  Status:  Final result Visible to patient:  No (Not Released) Next appt:  08/06/2016 at 01:15 PM in Family Medicine ([facility] Coumadin) Dx:  Hematuria, unspecified type    Ref Range & Units 1d ago (07/07/16) 27yrago (06/01/15) 13yrgo (04/10/15) 2y6yro (10/26/13) 84yr584yr (07/19/13)   Color, UA yellow brown         Clarity, UA clear cloudy         Glucose, UA negative mg/dL negative  NEGATIVER    NEGATIVER    Bilirubin, UA negative negative        Ketones, POC UA negative mg/dL negative        Spec Grav, UA 1.010 - 1.025 1.025        Blood, UA negative large         pH, UA 5.0 - 8.0 5.5        Protein Ur, POC negative mg/dL =100         Urobilinogen, UA 0.2 or 1.0 E.U./dL 1.0   0.2R  0.2R  1.0R    Nitrite, UA Negative Positive         Leukocytes, UA Negative Small (1+)             Lab Results  Component Value Date   WBC 12.1 (H) 07/07/2016   HGB 12.1 (L) 06/12/2016   HCT 36.2 (L) 07/07/2016   PLT  230 07/07/2016   GLUCOSE 115 (H) 07/07/2016   CHOL 102 04/10/2015   TRIG 57.0 04/10/2015   HDL 45.60 04/10/2015   LDLCALC 45 04/10/2015   ALT 16 07/07/2016   AST 25 07/07/2016   NA 139 07/07/2016   K 4.2 07/07/2016   CL 96 07/07/2016   CREATININE 0.94 07/07/2016   BUN 21 07/07/2016   CO2 23 07/07/2016   TSH 1.48 04/10/2015   PSA 2.19 12/06/2007   INR 3.0 (H) 07/07/2016   HGBA1C 6.6 (H) 04/10/2015   MICROALBUR 4.1 (H) 04/10/2015    Urine culture pending 5/7     Assessment & Plan:

## 2016-07-08 NOTE — Assessment & Plan Note (Signed)
stable overall by history and exam, recent data reviewed with pt, and pt to continue medical treatment as before,  to f/u any worsening symptoms or concerns BP Readings from Last 3 Encounters:  07/08/16 126/76  07/07/16 111/67  07/04/16 128/84

## 2016-07-08 NOTE — Patient Instructions (Signed)
You had the antibiotic shot today (rocephin)  Please take all new medication as prescribed - the duricef that is not yet delivered  Please continue all other medications as before, and refills have been done if requested.  Please have the pharmacy call with any other refills you may need.  Please continue your efforts at being more active, low cholesterol diabetic diet, and weight control.  Please keep your appointments with your specialists as you may have planned  Please return in 1 week, or sooner if needed

## 2016-07-08 NOTE — Progress Notes (Signed)
Pre visit review using our clinic review tool, if applicable. No additional management support is needed unless otherwise documented below in the visit note. 

## 2016-07-08 NOTE — Assessment & Plan Note (Signed)
Recent onset but not yet able to start oral antibx without s/s sepsis,, so for rocephin IM today, to start po antibx as they are delivered , let us know if this does not happen, f/u urine culture as ordered

## 2016-07-08 NOTE — Assessment & Plan Note (Signed)
Symptomatically improved, spine nontender, despite UTi suspect pain is spine related now improved,  to f/u any worsening symptoms or concerns, cont same tx, no worsening neuro changes

## 2016-07-08 NOTE — Assessment & Plan Note (Signed)
INR not elevated yestserday but likely a factor with recent hematuria, cant completely r/o bladder ca recurrent but more likely infection related, to f/u with urology as planned

## 2016-07-09 ENCOUNTER — Telehealth: Payer: Self-pay | Admitting: Internal Medicine

## 2016-07-09 NOTE — Telephone Encounter (Signed)
Pt called and said that he is on his last dose of Prednisone. I do not think that he took as prescribed. He said that he got the medication on Friday and has been taking 3 a day every day up until yesterday and today in which he has been taking 2. He said that he is still having back pain and did not know if he needed to continue taking it or what he should do.

## 2016-07-09 NOTE — Telephone Encounter (Signed)
Pt states that he has taken medication as prescribed after touching base with him. However he is unsure if he needs to continue taking what he has left or stop taking them. I'm unsure why he would have any extras left. Please advise.

## 2016-07-09 NOTE — Telephone Encounter (Signed)
Spoke with patient again and we each clarified how he was taking the medication. Which is how it is instructed. He was confused about if it would be any refills after he finished. I informed him of that Dr. Jenny Reichmann said and he expressed understanding.

## 2016-07-09 NOTE — Telephone Encounter (Signed)
I would not take more than originally presribed as that would be about the best at one time that can be done.  The prednisone can be repeated, but it is only done a few times per year. thanks

## 2016-07-10 ENCOUNTER — Encounter (HOSPITAL_COMMUNITY): Payer: Self-pay | Admitting: Emergency Medicine

## 2016-07-10 ENCOUNTER — Ambulatory Visit: Payer: Medicare Other | Admitting: Internal Medicine

## 2016-07-10 ENCOUNTER — Emergency Department (HOSPITAL_COMMUNITY)
Admission: EM | Admit: 2016-07-10 | Discharge: 2016-07-10 | Disposition: A | Discharge status: Home / Self Care | Payer: Medicare Other | Attending: Emergency Medicine | Admitting: Emergency Medicine

## 2016-07-10 ENCOUNTER — Emergency Department (HOSPITAL_COMMUNITY): Payer: Medicare Other

## 2016-07-10 DIAGNOSIS — S20222A Contusion of left back wall of thorax, initial encounter: Secondary | ICD-10-CM | POA: Insufficient documentation

## 2016-07-10 DIAGNOSIS — W19XXXA Unspecified fall, initial encounter: Secondary | ICD-10-CM | POA: Diagnosis not present

## 2016-07-10 DIAGNOSIS — Y999 Unspecified external cause status: Secondary | ICD-10-CM | POA: Insufficient documentation

## 2016-07-10 DIAGNOSIS — Z7901 Long term (current) use of anticoagulants: Secondary | ICD-10-CM | POA: Diagnosis not present

## 2016-07-10 DIAGNOSIS — Y92009 Unspecified place in unspecified non-institutional (private) residence as the place of occurrence of the external cause: Secondary | ICD-10-CM | POA: Diagnosis not present

## 2016-07-10 DIAGNOSIS — Z8551 Personal history of malignant neoplasm of bladder: Secondary | ICD-10-CM | POA: Diagnosis not present

## 2016-07-10 DIAGNOSIS — I110 Hypertensive heart disease with heart failure: Secondary | ICD-10-CM | POA: Insufficient documentation

## 2016-07-10 DIAGNOSIS — Z87891 Personal history of nicotine dependence: Secondary | ICD-10-CM | POA: Diagnosis not present

## 2016-07-10 DIAGNOSIS — Y939 Activity, unspecified: Secondary | ICD-10-CM | POA: Insufficient documentation

## 2016-07-10 DIAGNOSIS — I5033 Acute on chronic diastolic (congestive) heart failure: Secondary | ICD-10-CM | POA: Insufficient documentation

## 2016-07-10 DIAGNOSIS — S299XXA Unspecified injury of thorax, initial encounter: Secondary | ICD-10-CM | POA: Diagnosis present

## 2016-07-10 DIAGNOSIS — E1140 Type 2 diabetes mellitus with diabetic neuropathy, unspecified: Secondary | ICD-10-CM | POA: Insufficient documentation

## 2016-07-10 DIAGNOSIS — S3991XA Unspecified injury of abdomen, initial encounter: Secondary | ICD-10-CM | POA: Diagnosis not present

## 2016-07-10 DIAGNOSIS — Z8546 Personal history of malignant neoplasm of prostate: Secondary | ICD-10-CM | POA: Insufficient documentation

## 2016-07-10 DIAGNOSIS — Z7982 Long term (current) use of aspirin: Secondary | ICD-10-CM | POA: Diagnosis not present

## 2016-07-10 DIAGNOSIS — Z7984 Long term (current) use of oral hypoglycemic drugs: Secondary | ICD-10-CM | POA: Diagnosis not present

## 2016-07-10 DIAGNOSIS — K5900 Constipation, unspecified: Secondary | ICD-10-CM | POA: Diagnosis not present

## 2016-07-10 DIAGNOSIS — Z79899 Other long term (current) drug therapy: Secondary | ICD-10-CM | POA: Insufficient documentation

## 2016-07-10 LAB — URINALYSIS, ROUTINE W REFLEX MICROSCOPIC
Bacteria, UA: NONE SEEN
Bilirubin Urine: NEGATIVE
GLUCOSE, UA: NEGATIVE mg/dL
Ketones, ur: NEGATIVE mg/dL
NITRITE: NEGATIVE
PH: 6 (ref 5.0–8.0)
Protein, ur: NEGATIVE mg/dL
SPECIFIC GRAVITY, URINE: 1.002 — AB (ref 1.005–1.030)
Squamous Epithelial / LPF: NONE SEEN

## 2016-07-10 LAB — CBC WITH DIFFERENTIAL/PLATELET
BASOS ABS: 0 10*3/uL (ref 0.0–0.1)
BASOS PCT: 0 %
EOS PCT: 0 %
Eosinophils Absolute: 0 10*3/uL (ref 0.0–0.7)
HCT: 41.7 % (ref 39.0–52.0)
Hemoglobin: 14.3 g/dL (ref 13.0–17.0)
Lymphocytes Relative: 11 %
Lymphs Abs: 1.7 10*3/uL (ref 0.7–4.0)
MCH: 31.6 pg (ref 26.0–34.0)
MCHC: 34.3 g/dL (ref 30.0–36.0)
MCV: 92.3 fL (ref 78.0–100.0)
MONO ABS: 0.7 10*3/uL (ref 0.1–1.0)
Monocytes Relative: 4 %
NEUTROS ABS: 13.6 10*3/uL — AB (ref 1.7–7.7)
Neutrophils Relative %: 85 %
PLATELETS: 252 10*3/uL (ref 150–400)
RBC: 4.52 MIL/uL (ref 4.22–5.81)
RDW: 16.1 % — AB (ref 11.5–15.5)
WBC: 15.9 10*3/uL — AB (ref 4.0–10.5)

## 2016-07-10 LAB — COMPREHENSIVE METABOLIC PANEL
ALBUMIN: 4.4 g/dL (ref 3.5–5.0)
ALK PHOS: 81 U/L (ref 38–126)
ALT: 27 U/L (ref 17–63)
ANION GAP: 10 (ref 5–15)
AST: 38 U/L (ref 15–41)
BILIRUBIN TOTAL: 1.8 mg/dL — AB (ref 0.3–1.2)
BUN: 21 mg/dL — AB (ref 6–20)
CALCIUM: 9.6 mg/dL (ref 8.9–10.3)
CO2: 25 mmol/L (ref 22–32)
Chloride: 101 mmol/L (ref 101–111)
Creatinine, Ser: 1.06 mg/dL (ref 0.61–1.24)
GFR calc Af Amer: >60 mL/min (ref >60)
GLUCOSE: 104 mg/dL — AB (ref 65–99)
POTASSIUM: 3.7 mmol/L (ref 3.5–5.1)
Sodium: 136 mmol/L (ref 135–145)
TOTAL PROTEIN: 7.9 g/dL (ref 6.5–8.1)

## 2016-07-10 LAB — POC OCCULT BLOOD, ED: FECAL OCCULT BLD: POSITIVE — AB

## 2016-07-10 MED ORDER — SORBITOL 70 % SOLN
960.0000 mL | TOPICAL_OIL | Freq: Once | ORAL | Status: AC
  Administered 2016-07-10: 960 mL via RECTAL
  Filled 2016-07-10: qty 240

## 2016-07-10 MED ORDER — CYCLOBENZAPRINE HCL 10 MG PO TABS
10.0000 mg | ORAL_TABLET | Freq: Once | ORAL | Status: AC
  Administered 2016-07-10: 10 mg via ORAL
  Filled 2016-07-10: qty 1

## 2016-07-10 MED ORDER — TIZANIDINE HCL 4 MG PO TABS
2.0000 mg | ORAL_TABLET | Freq: Once | ORAL | Status: AC
  Administered 2016-07-10: 2 mg via ORAL
  Filled 2016-07-10: qty 1

## 2016-07-10 MED ORDER — POLYETHYLENE GLYCOL 3350 17 G PO PACK: 17.0000 g | PACK | Freq: Two times a day (BID) | ORAL | 0 refills | Status: AC

## 2016-07-10 MED ORDER — DOCUSATE SODIUM 100 MG PO CAPS: 100.0000 mg | ORAL_CAPSULE | Freq: Two times a day (BID) | ORAL | 0 refills | Status: AC

## 2016-07-10 NOTE — ED Provider Notes (Signed)
[facility] DEPT Provider Note   CSN: [id number] Arrival date & time: 07/10/16  1337     History   Chief Complaint Chief Complaint  Patient presents with  . Back Pain  . Constipation    HPI Eric Rivers is a 83 y.o. male.  HPI Patient reports that he is at a problem with constipation now for about 3 days. He reports that he feels like he needs to have a bowel movement but he is also experiencing lower back pain. Low back pain is worse with movement. He denies radiation into his legs or lower extremity weakness. He reports that he was seen by his PCP a week ago for the low back pain and he was given an injection and prescribed tramadol and tizanidine. He reports he continues to have pain and now has developed constipation which he suspects is due to the medications. He reports he frequently urinates which she attributes to his Lasix. He reports he does have problems sometimes with incontinence and urgency. He denies feeling like he has significant suprapubic pressure or pain. Patient reports that he does intermittently fall at home which she attributes to age and incoordination. He reports he fell yesterday and had EMS help him up. He reports he didn't get any injuries and denies any pain in association with that fall. Past Medical History:  Diagnosis Date  . ACE-inhibitor cough   . Aortic dissection [facility])    Surgical repair February, 2014  . Aortic stenosis    mild....echo... september... 2010/ mild... echo.[name] KitchenMarland KitchenDec, 2011  . Atrial fibrillation (HCC)     Chronic,   24 hour holter, September, 2011.... atrial fib rate is controlled.... there is some bradycardia but  no marked pauses  . Bladder cancer [facility])    recurrence with TUR-B March '09  . DDD (degenerative disc disease)    lumbar spine  . Diabetes mellitus    type 2  . Diabetes mellitus with neuropathy ([place]) 12/04/2006   Qualifier: Diagnosis of  By: Linda Hedges MD, Heinz Knuckles  medications - DPP4   . Diverticulosis of colon with  hemorrhage 01/01/2014  . Ejection fraction    EF 60%, echo, 01/2010  . Fluid overload   . Fracture of metacarpal bone 11/30/11  . GERD (gastroesophageal reflux disease)   . Gout   . History of echocardiogram    Echo 5/17 - mild LVH, EF 55-60%, mod AS (mean 16 mmHg, peak 29 mmHg), MAC, midl MR, massive BAE, mod dilated RV, low normal RVSF, mod TR, PASP 64 mmHg  . Hx of colonoscopy    approx. 10 years  . Hypercholesterolemia   . Hyperlipidemia   . Hypertension   . Mitral regurgitation   . Normal nuclear stress test    06/2005 , also ABI normal 2007  . Prostate cancer ([place])    radiation tx.  . Pulmonary hypertension ([place])    17mHg, echo, 01/2010  . Shortness of breath    on exertion  . Warfarin anticoagulation    Atrial fib  . Whooping cough     Patient Active Problem List   Diagnosis Date Noted  . Acute UTI 07/07/2016  . Low back pain 07/04/2016  . Left-sided nosebleed 06/12/2016  . Epistaxis 04/11/2016  . Degenerative arthritis of right knee 09/20/2015  . Pronation deformity of both feet 09/20/2015  . Acute on chronic diastolic heart failure ([place] 09/19/2015  . Dyspnea 09/07/2015  . Peripheral edema 09/07/2015  . Right knee pain 09/07/2015  . Hematuria 06/13/2015  .  AKI (acute kidney injury) ([place]) 06/02/2015  . Elevated INR 06/01/2015  . Anemia 06/01/2015  . Hemorrhagic cystitis 06/01/2015  . Cough 04/20/2014  . Bradycardia 01/06/2014  . Diverticulosis of colon with hemorrhage 01/01/2014  . Perianal dermatitis 01/01/2014  . Chronic anemia 12/29/2013  . Lower GI bleed 12/29/2013  . Warfarin-induced coagulopathy ([place]) 12/29/2013  . Acute blood loss anemia 12/29/2013  . Chronic diastolic CHF (congestive heart failure) ([place]) 11/02/2013  . Syncope 07/19/2013  . Advanced care planning/counseling discussion 04/12/2013  . Encounter for therapeutic drug monitoring 04/12/2013  . Insomnia 04/11/2013  . Mitral regurgitation   . Ascending aortic dissection ([place]) 04/16/2012    . Malignant neoplasm of prostate ([place]) 10/27/2011  . Warfarin anticoagulation   . Hypertension   . GERD (gastroesophageal reflux disease)   . Aortic stenosis   . Pulmonary hypertension ([place])   . Hyperlipidemia   . Ejection fraction   . Normal nuclear stress test   . Routine general medical examination at a health care facility 08/22/2010  . Bladder cancer ([place])   . Gout   . Chronic atrial fibrillation ([place])   . HYPERTROPHY PROSTATE W/O UR OBST & OTH LUTS 09/07/2007  . DEGENERATIVE JOINT DISEASE, GENERALIZED 09/07/2007  . NEOP, MALIGNANT, BLADDER NEC 12/04/2006  . Diabetes mellitus with neuropathy ([place]) 12/04/2006    Past Surgical History:  Procedure Laterality Date  . ASCENDING AORTIC ROOT REPLACEMENT N/A 04/15/2012   Procedure: ASCENDING AORTIC ROOT REPLACEMENT;  Surgeon: Gaye Pollack, MD;  Location: [facility];  Service: Open Heart Surgery;  Laterality: N/A;  . bladder cancer biopsy  10/16/2010   negative for malignancy  . bladder microscopic  04/13/2007   high grade papillary urothelial lesions  . COLONOSCOPY N/A 01/01/2014   Procedure: COLONOSCOPY;  Surgeon: Gatha Mayer, MD;  Location: [facility];  Service: Endoscopy;  Laterality: N/A;  . CYSTOSCOPY/RETROGRADE/URETEROSCOPY Bilateral 04/04/2013   Procedure: CYSTOSCOPY WITH RETROGRADE PYELOGRAM AND BLADDER BIOPSY;  Surgeon: Molli Hazard, MD;  Location: [facility];  Service: Urology;  Laterality: Bilateral;  . 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 EXPLORATION POST OPERATIVE OPEN HEART  04/2012  . Herniatic disc surgery    . LUMBAR LAMINECTOMY     '02  . PROSTATE BIOPSY  09/25/2011   GLEASON 3+3=6 AND 4+3=7  . TEE WITHOUT CARDIOVERSION N/A 04/15/2012   Procedure: TRANSESOPHAGEAL ECHOCARDIOGRAM (TEE);  Surgeon: Gaye Pollack, MD;  Location: [facility];  Service: Open Heart Surgery;  Laterality: N/A;  . TRANSURETHRAL RESECTION OF BLADDER TUMOR N/A 06/13/2015   Procedure: TRANSURETHRAL RESECTION  OF PROSTATE (TURP) CLOT EVACUATION AND FULGERATION, BLADDER STONE REMOVAL ;  Surgeon: Alexis Frock, MD;  Location: [facility];  Service: Urology;  Laterality: N/A;  . TUR-BT '06, '09         Home Medications    Prior to Admission medications   Medication Sig Start Date End Date Taking? Authorizing Provider  acetaminophen (TYLENOL) 500 MG tablet Take 1,000 mg by mouth daily as needed for mild pain, moderate pain, fever or headache.     [provider]  allopurinol (ZYLOPRIM) 300 MG tablet take 1 tablet by mouth once daily 02/12/16   Biagio Borg, MD  amLODipine (NORVASC) 2.5 MG tablet  03/25/16   [provider]  aspirin EC 325 MG tablet Take 325 mg by mouth daily.    [provider]  atorvastatin (LIPITOR) 20 MG tablet take 1 tablet by mouth once daily 02/12/16  Biagio Borg, MD  Calcium Carb-Cholecalciferol (CALCIUM 600 + D PO) Take 1 tablet by mouth at bedtime.     [provider]  cefadroxil (DURICEF) 500 MG capsule Take 1 capsule (500 mg total) by mouth 2 (two) times daily. 07/07/16 07/14/16  Horald Pollen, MD  desonide (DESOWEN) 0.05 % lotion Apply 1 application topically daily as needed (rosacea).     [provider]  Ferrous Sulfate (IRON) 325 (65 Fe) MG TABS Take 325 mg by mouth 2 (two) times daily. Patient taking differently: Take 650 mg by mouth daily.  10/15/15   Dorothy Spark, MD  finasteride (PROSCAR) 5 MG tablet Take 1 tablet (5 mg total) by mouth every morning. 12/23/13   Biagio Borg, MD  furosemide (LASIX) 40 MG tablet Take 1 tablet (40 mg total) by mouth daily. 11/12/15   Imogene Burn, PA-C  JANUVIA 100 MG tablet take 1 tablet by mouth once daily 06/23/16   Biagio Borg, MD  ketoconazole (NIZORAL) 2 % shampoo Apply 1 application topically 4 (four) times a week.    [provider]  mirabegron ER (MYRBETRIQ) 50 MG TB24 tablet Take 50 mg by mouth daily.    [provider]  Multiple Vitamins-Minerals  (ONCOVITE PO) Take 1 tablet by mouth daily.     [provider]  pantoprazole (PROTONIX) 40 MG tablet take 1 tablet by mouth once daily 02/12/16   Biagio Borg, MD  potassium chloride (K-DUR,KLOR-CON) 10 MEQ tablet take 1 tablet by mouth once daily 07/02/16   Dorothy Spark, MD  predniSONE (DELTASONE) 10 MG tablet 3 tabs by mouth per day for 3 days,2tabs per day for 3 days,1tab per day for 3 days 07/04/16   Biagio Borg, MD  senna (SENOKOT) 8.6 MG tablet Take 1 tablet by mouth at bedtime.     [provider]  Tamsulosin HCl (FLOMAX) 0.4 MG CAPS Take 0.4 mg by mouth at bedtime.     [provider]  tiZANidine (ZANAFLEX) 4 MG tablet Take 1 tablet (4 mg total) by mouth every 6 (six) hours as needed for muscle spasms. 07/04/16   Biagio Borg, MD  traMADol (ULTRAM) 50 MG tablet Take 1 tablet (50 mg total) by mouth every 6 (six) hours as needed. 07/04/16   Biagio Borg, MD  traZODone (DESYREL) 50 MG tablet Take 1 tablet (50 mg total) by mouth See admin instructions. 01/30/16   Biagio Borg, MD  warfarin (COUMADIN) 4 MG tablet take as directed [place] 01/07/16   Biagio Borg, MD    Family History Family History  Problem Relation Age of Onset  . Prostate cancer Father 51       passed with prostate ca  . Cancer Father   . Stroke Father   . Heart disease Mother   . Hypertension Mother   . Heart attack Mother     Social History Social History  Substance Use Topics  . Smoking status: Former Smoker    Packs/day: 1.00    Types: Cigarettes    Quit date: 03/04/1975  . Smokeless tobacco: Never Used  . Alcohol use No     Allergies   Metoprolol   Review of Systems Review of Systems  10 Systems reviewed and are negative for acute change except as noted in the HPI.  Physical Exam Updated Vital Signs BP (!) 147/82 (BP Location: Left Arm)   Pulse 82   Temp 98.1 F (36.7  C) (Oral)   Resp 18   Ht _0  (1.727 m)   Wt 210 lb (95.3 kg)   SpO2 96%    BMI 31.93 kg/m   Physical Exam  Constitutional: He is oriented to person, place, and time.  Patient is alert and nontoxic. No respiratory distress.  HENT:  Head: Normocephalic and atraumatic.  Mouth/Throat: Oropharynx is clear and moist.  Eyes: EOM are normal.  Cardiovascular: Normal rate, regular rhythm and intact distal pulses.   2\6 systolic ejection murmur.  Pulmonary/Chest: Effort normal and breath sounds normal.  Abdominal: Soft. Bowel sounds are normal. He exhibits no distension. There is no tenderness. There is no guarding.  Genitourinary:  Genitourinary Comments: Prostate enlarged and firm. Formed soft stool high in the vault. No impaction. Stool is brown.  Musculoskeletal:  Contusion left thoracic back approximately 20 cm x 2 cm, diffuse petechial. Nontender. Patient does experience pain in the lower back with sitting forward and rolling into the lateral decubitus position. Some reproducible pain at the SI joints left greater than right. 1+ pitting edema bilateral lower extremities. Skin thickening and hyperpigmentation consistent with chronic venous stasis. No active wounds.  Neurological: He is alert and oriented to person, place, and time. He exhibits normal muscle tone. Coordination normal.  Skin: Skin is warm and dry.  Psychiatric: He has a normal mood and affect.     ED Treatments / Results  Labs (all labs ordered are listed, but only abnormal results are displayed) Labs Reviewed  COMPREHENSIVE METABOLIC PANEL  CBC WITH DIFFERENTIAL/PLATELET  URINALYSIS, ROUTINE W REFLEX MICROSCOPIC    EKG  EKG Interpretation None       Radiology No results found.  Procedures Procedures (including critical care time)  Medications Ordered in ED Medications - No data to display   Initial Impression / Assessment and Plan / ED Course  I have reviewed the triage vital signs and the nursing notes.  Pertinent labs & imaging results that were available during my care  of the patient were reviewed by me and considered in my medical decision making (see chart for details).      Final Clinical Impressions(s) / ED Diagnoses   Final diagnoses:  Constipation Fall Low back pain   CT to be reviewed by Dr. Dayna Barker. If no acute findings, plan to treat for constipation with outpatient follow up. New Prescriptions New Prescriptions   No medications on file     Charlesetta Shanks, MD 07/14/16 1511

## 2016-07-10 NOTE — ED Triage Notes (Signed)
Pt comes in with complaints of lower back pain.  Recently went to PCP and received an injection in his buttocks for pain.  States pain has worsened. Was prescribed tramadol and tizanidine and has not had relief.  Also reports constipation for the past 3 days.  Takes senokot at home. Able to walk with some weakness. No urinary changes.

## 2016-07-10 NOTE — Telephone Encounter (Signed)
He should go to ER now for intractable pain, recent fall and possibility of even spine fracture, and constipation worsening/abd pain

## 2016-07-10 NOTE — Discharge Instructions (Signed)
MiraLAX is an osmotic laxative. This means that it will keep electrolytes and water in your stool and allow you to have easier bowel movements. You  can take this medication up to 3 times daily as needed produce bowel movements. However while you're taking this much MiraLAX you need to make sure you are replenishing her electrolytes and water loss. He should also not take it more than 2 days in a row at this level. Generally I recommend people workup to this. For example take 1 capful of MiraLAX once a day for 2-3 days and if you do not get improvement in your bowel habits then take 1 capful twice a day for 2-3 days and if they don't get relief from this and you can take 1 capful 3 times a day for 2 days. If you start having diarrhea, decrease the amount of MiraLAX you are using until you have soft formed stools once to twice a day. Remember during this process that you need to increase your electrolytes and water intake, so oftentimes sports drinks are good for this. Some people end up taking half or one whole capful daily for the rest of her lives to have regular bowel movements you can do this as you feel is proper. ° °

## 2016-07-10 NOTE — Telephone Encounter (Signed)
Tried to contact patient at the home number twice but had to leave a VM. I also reached out to his DPR Jeneen Rinks however the patient was not with him. I told Jeneen Rinks that it was important that he call me back if he happened to reach him before I did.

## 2016-07-10 NOTE — Telephone Encounter (Signed)
Pt has called back and was made aware that Dr. Jenny Reichmann would like for him to been seen at the ER. He stated that he will be heading over to [facility] shortly.

## 2016-07-10 NOTE — ED Notes (Signed)
ED Provider at bedside. 

## 2016-07-10 NOTE — Telephone Encounter (Signed)
Pt called this morning and said that the pain in his back is terrible. He did not know if he should come back in to see Dr Jenny Reichmann or what would be best. He said that he has been constipated for the past several days and it has become very uncomfortable. He said that he fell in his home last night around 11:00 and was not able to get himself up. He has a Life Alert band so an ambulance came out to help him. They checked him out and said that he was not injured. Please advise.

## 2016-07-14 ENCOUNTER — Emergency Department (HOSPITAL_COMMUNITY)
Admission: EM | Admit: 2016-07-14 | Discharge: 2016-07-14 | Disposition: A | Discharge status: Home / Self Care | Payer: Medicare Other | Attending: Emergency Medicine | Admitting: Emergency Medicine

## 2016-07-14 ENCOUNTER — Emergency Department (HOSPITAL_COMMUNITY): Payer: Medicare Other

## 2016-07-14 ENCOUNTER — Encounter (HOSPITAL_COMMUNITY): Payer: Self-pay | Admitting: Nurse Practitioner

## 2016-07-14 DIAGNOSIS — S32028A Other fracture of second lumbar vertebra, initial encounter for closed fracture: Secondary | ICD-10-CM | POA: Diagnosis not present

## 2016-07-14 DIAGNOSIS — E1140 Type 2 diabetes mellitus with diabetic neuropathy, unspecified: Secondary | ICD-10-CM | POA: Diagnosis not present

## 2016-07-14 DIAGNOSIS — M5126 Other intervertebral disc displacement, lumbar region: Secondary | ICD-10-CM | POA: Diagnosis not present

## 2016-07-14 DIAGNOSIS — M549 Dorsalgia, unspecified: Secondary | ICD-10-CM

## 2016-07-14 DIAGNOSIS — S32020A Wedge compression fracture of second lumbar vertebra, initial encounter for closed fracture: Secondary | ICD-10-CM | POA: Diagnosis not present

## 2016-07-14 DIAGNOSIS — Y999 Unspecified external cause status: Secondary | ICD-10-CM | POA: Insufficient documentation

## 2016-07-14 DIAGNOSIS — Y939 Activity, unspecified: Secondary | ICD-10-CM | POA: Diagnosis not present

## 2016-07-14 DIAGNOSIS — S3992XA Unspecified injury of lower back, initial encounter: Secondary | ICD-10-CM | POA: Diagnosis present

## 2016-07-14 DIAGNOSIS — I110 Hypertensive heart disease with heart failure: Secondary | ICD-10-CM | POA: Insufficient documentation

## 2016-07-14 DIAGNOSIS — X58XXXA Exposure to other specified factors, initial encounter: Secondary | ICD-10-CM | POA: Diagnosis not present

## 2016-07-14 DIAGNOSIS — Y929 Unspecified place or not applicable: Secondary | ICD-10-CM | POA: Insufficient documentation

## 2016-07-14 DIAGNOSIS — Z7901 Long term (current) use of anticoagulants: Secondary | ICD-10-CM | POA: Insufficient documentation

## 2016-07-14 DIAGNOSIS — Z7982 Long term (current) use of aspirin: Secondary | ICD-10-CM | POA: Insufficient documentation

## 2016-07-14 DIAGNOSIS — Z8546 Personal history of malignant neoplasm of prostate: Secondary | ICD-10-CM | POA: Diagnosis not present

## 2016-07-14 DIAGNOSIS — Z87891 Personal history of nicotine dependence: Secondary | ICD-10-CM | POA: Diagnosis not present

## 2016-07-14 DIAGNOSIS — I5032 Chronic diastolic (congestive) heart failure: Secondary | ICD-10-CM | POA: Insufficient documentation

## 2016-07-14 DIAGNOSIS — M5489 Other dorsalgia: Secondary | ICD-10-CM | POA: Diagnosis not present

## 2016-07-14 DIAGNOSIS — M546 Pain in thoracic spine: Secondary | ICD-10-CM | POA: Diagnosis not present

## 2016-07-14 DIAGNOSIS — Z8551 Personal history of malignant neoplasm of bladder: Secondary | ICD-10-CM | POA: Diagnosis not present

## 2016-07-14 DIAGNOSIS — R030 Elevated blood-pressure reading, without diagnosis of hypertension: Secondary | ICD-10-CM | POA: Diagnosis not present

## 2016-07-14 LAB — BASIC METABOLIC PANEL
ANION GAP: 14 (ref 5–15)
BUN: 21 mg/dL — ABNORMAL HIGH (ref 6–20)
CHLORIDE: 99 mmol/L — AB (ref 101–111)
CO2: 23 mmol/L (ref 22–32)
Calcium: 9.4 mg/dL (ref 8.9–10.3)
Creatinine, Ser: 0.84 mg/dL (ref 0.61–1.24)
GFR calc Af Amer: >60 mL/min (ref >60)
GLUCOSE: 146 mg/dL — AB (ref 65–99)
POTASSIUM: 3.8 mmol/L (ref 3.5–5.1)
SODIUM: 136 mmol/L (ref 135–145)

## 2016-07-14 LAB — CBC
HCT: 41.2 % (ref 39.0–52.0)
HEMOGLOBIN: 13.6 g/dL (ref 13.0–17.0)
MCH: 31.1 pg (ref 26.0–34.0)
MCHC: 33 g/dL (ref 30.0–36.0)
MCV: 94.1 fL (ref 78.0–100.0)
PLATELETS: 249 10*3/uL (ref 150–400)
RBC: 4.38 MIL/uL (ref 4.22–5.81)
RDW: 15.9 % — ABNORMAL HIGH (ref 11.5–15.5)
WBC: 17.6 10*3/uL — AB (ref 4.0–10.5)

## 2016-07-14 MED ORDER — KETOROLAC TROMETHAMINE 15 MG/ML IJ SOLN
15.0000 mg | Freq: Once | INTRAMUSCULAR | Status: AC
  Administered 2016-07-14: 15 mg via INTRAVENOUS
  Filled 2016-07-14: qty 1

## 2016-07-14 MED ORDER — MORPHINE SULFATE (PF) 4 MG/ML IV SOLN
6.0000 mg | Freq: Once | INTRAVENOUS | Status: AC
  Administered 2016-07-14: 6 mg via INTRAVENOUS
  Filled 2016-07-14: qty 2

## 2016-07-14 MED ORDER — OXYCODONE HCL 5 MG PO TABS: 5.0000 mg | ORAL_TABLET | ORAL | 0 refills | Status: DC | PRN

## 2016-07-14 MED ORDER — DIAZEPAM 5 MG PO TABS
5.0000 mg | ORAL_TABLET | Freq: Once | ORAL | Status: AC
  Administered 2016-07-14: 5 mg via ORAL
  Filled 2016-07-14: qty 1

## 2016-07-14 NOTE — ED Triage Notes (Signed)
Pt arrived via EMS with c/o lower back pain. Per ems he was here on 5/10 and received pain medications and now the pain is worsening despite pain medication. He is here due to increasing pain. Denies pain anywhere else. Denies any recent falls or trauma. States he is eating and drinking as usual, per ems patient states he was experiencing some constipation but took OTC laxative and had large bm on yesterday. Neuro exam WNL. Denies any numbness or tingling in extremeties. VS:  190/100, 133cbg, HR 110, 99% RA, 20 resp. 20Per ems patient declined IV start and pain medication in route.

## 2016-07-14 NOTE — ED Provider Notes (Signed)
[facility] DEPT Provider Note   CSN: [id number] Arrival date & time: 07/14/16  3546  By signing my name below, I, Margit Banda, attest that this documentation has been prepared under the direction and in the presence of Jola Schmidt, MD. Electronically Signed: Margit Banda, ED Scribe. 07/14/16. 1:55 AM.  History   Chief Complaint Chief Complaint  Patient presents with  . Back Pain    HPI Eric Rivers is a 83 y.o. male who presents to the Emergency Department complaining of gradually worsening, constant lower back pain that started ~ a week ago. Pt saw his PCP for sx and was given medication with temporary relief. Associated sx include bilateral leg muscle spasms. Took tylenol PTA with no relief. He was recently prescribed Tramadol 50 mg, Tizanidine 4 mg, and Prednisone 10 mg. Pt denies nausea, vomiting, diarrhea, fever, and abdominal pain.  The history is provided by the patient. No language interpreter was used.    Past Medical History:  Diagnosis Date  . ACE-inhibitor cough   . Aortic dissection [facility])    Surgical repair February, 2014  . Aortic stenosis    mild....echo... september... 2010/ mild... echo.[name] KitchenMarland KitchenDec, 2011  . Atrial fibrillation (HCC)     Chronic,   24 hour holter, September, 2011.... atrial fib rate is controlled.... there is some bradycardia but  no marked pauses  . Bladder cancer [facility])    recurrence with TUR-B March '09  . DDD (degenerative disc disease)    lumbar spine  . Diabetes mellitus    type 2  . Diabetes mellitus with neuropathy ([place]) 12/04/2006   Qualifier: Diagnosis of  By: Linda Hedges MD, Heinz Knuckles  medications - DPP4   . Diverticulosis of colon with hemorrhage 01/01/2014  . Ejection fraction    EF 60%, echo, 01/2010  . Fluid overload   . Fracture of metacarpal bone 11/30/11  . GERD (gastroesophageal reflux disease)   . Gout   . History of echocardiogram    Echo 5/17 - mild LVH, EF 55-60%, mod AS (mean 16 mmHg, peak 29 mmHg), MAC, midl  MR, massive BAE, mod dilated RV, low normal RVSF, mod TR, PASP 64 mmHg  . Hx of colonoscopy    approx. 10 years  . Hypercholesterolemia   . Hyperlipidemia   . Hypertension   . Mitral regurgitation   . Normal nuclear stress test    06/2005 , also ABI normal 2007  . Prostate cancer ([place])    radiation tx.  . Pulmonary hypertension ([place])    7mHg, echo, 01/2010  . Shortness of breath    on exertion  . Warfarin anticoagulation    Atrial fib  . Whooping cough     Patient Active Problem List   Diagnosis Date Noted  . Acute UTI 07/07/2016  . Low back pain 07/04/2016  . Left-sided nosebleed 06/12/2016  . Epistaxis 04/11/2016  . Degenerative arthritis of right knee 09/20/2015  . Pronation deformity of both feet 09/20/2015  . Acute on chronic diastolic heart failure ([place] 09/19/2015  . Dyspnea 09/07/2015  . Peripheral edema 09/07/2015  . Right knee pain 09/07/2015  . Hematuria 06/13/2015  . AKI (acute kidney injury) ([place] 06/02/2015  . Elevated INR 06/01/2015  . Anemia 06/01/2015  . Hemorrhagic cystitis 06/01/2015  . Cough 04/20/2014  . Bradycardia 01/06/2014  . Diverticulosis of colon with hemorrhage 01/01/2014  . Perianal dermatitis 01/01/2014  . Chronic anemia 12/29/2013  . Lower GI bleed 12/29/2013  . Warfar[place]induced coagulopathy ([place] 12/29/2013  . Acute blood loss  anemia 12/29/2013  . Chronic diastolic CHF (congestive heart failure) ([place]) 11/02/2013  . Syncope 07/19/2013  . Advanced care planning/counseling discussion 04/12/2013  . Encounter for therapeutic drug monitoring 04/12/2013  . Insomnia 04/11/2013  . Mitral regurgitation   . Ascending aortic dissection ([place]) 04/16/2012  . Malignant neoplasm of prostate ([place]) 10/27/2011  . Warfarin anticoagulation   . Hypertension   . GERD (gastroesophageal reflux disease)   . Aortic stenosis   . Pulmonary hypertension ([place])   . Hyperlipidemia   . Ejection fraction   . Normal nuclear stress test   . Routine general  medical examination at a health care facility 08/22/2010  . Bladder cancer ([place])   . Gout   . Chronic atrial fibrillation ([place])   . HYPERTROPHY PROSTATE W/O UR OBST & OTH LUTS 09/07/2007  . DEGENERATIVE JOINT DISEASE, GENERALIZED 09/07/2007  . NEOP, MALIGNANT, BLADDER NEC 12/04/2006  . Diabetes mellitus with neuropathy ([place]) 12/04/2006    Past Surgical History:  Procedure Laterality Date  . ASCENDING AORTIC ROOT REPLACEMENT N/A 04/15/2012   Procedure: ASCENDING AORTIC ROOT REPLACEMENT;  Surgeon: Gaye Pollack, MD;  Location: [facility];  Service: Open Heart Surgery;  Laterality: N/A;  . bladder cancer biopsy  10/16/2010   negative for malignancy  . bladder microscopic  04/13/2007   high grade papillary urothelial lesions  . COLONOSCOPY N/A 01/01/2014   Procedure: COLONOSCOPY;  Surgeon: Gatha Mayer, MD;  Location: [facility];  Service: Endoscopy;  Laterality: N/A;  . CYSTOSCOPY/RETROGRADE/URETEROSCOPY Bilateral 04/04/2013   Procedure: CYSTOSCOPY WITH RETROGRADE PYELOGRAM AND BLADDER BIOPSY;  Surgeon: Molli Hazard, MD;  Location: [facility];  Service: Urology;  Laterality: Bilateral;  . 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 EXPLORATION POST OPERATIVE OPEN HEART  04/2012  . Herniatic disc surgery    . LUMBAR LAMINECTOMY     '02  . PROSTATE BIOPSY  09/25/2011   GLEASON 3+3=6 AND 4+3=7  . TEE WITHOUT CARDIOVERSION N/A 04/15/2012   Procedure: TRANSESOPHAGEAL ECHOCARDIOGRAM (TEE);  Surgeon: Gaye Pollack, MD;  Location: [facility];  Service: Open Heart Surgery;  Laterality: N/A;  . TRANSURETHRAL RESECTION OF BLADDER TUMOR N/A 06/13/2015   Procedure: TRANSURETHRAL RESECTION OF PROSTATE (TURP) CLOT EVACUATION AND FULGERATION, BLADDER STONE REMOVAL ;  Surgeon: Alexis Frock, MD;  Location: [facility];  Service: Urology;  Laterality: N/A;  . TUR-BT '06, '09         Home Medications    Prior to Admission medications   Medication Sig Start Date End Date  Taking? Authorizing Provider  acetaminophen (TYLENOL) 500 MG tablet Take 1,000 mg by mouth every 6 (six) hours as needed for mild pain, moderate pain, fever or headache.     [provider]  allopurinol (ZYLOPRIM) 300 MG tablet take 1 tablet by mouth once daily 02/12/16   Biagio Borg, MD  amLODipine (NORVASC) 2.5 MG tablet Take 2.5 mg by mouth daily.     [provider]  aspirin EC 325 MG tablet Take 325 mg by mouth daily.    [provider]  atorvastatin (LIPITOR) 20 MG tablet take 1 tablet by mouth once daily 02/12/16   Biagio Borg, MD  Calcium Carbonate-Vitamin D (CALCIUM 600+D) 600-400 MG-UNIT tablet Take 1 tablet by mouth at bedtime.    [provider]  cefadroxil (DURICEF) 500 MG capsule Take 1 capsule (500 mg total) by mouth 2 (two) times daily. 07/07/16 07/14/16  Horald Pollen, MD  desonide (DESOWEN)  0.05 % lotion Apply 1 application topically daily as needed (rosacea).     [provider]  docusate sodium (COLACE) 100 MG capsule Take 1 capsule (100 mg total) by mouth every 12 (twelve) hours. 07/10/16   Mesner, [name], MD  ferrous sulfate 325 (65 FE) MG tablet Take 650 mg by mouth daily with breakfast.    [provider]  finasteride (PROSCAR) 5 MG tablet Take 1 tablet (5 mg total) by mouth every morning. 12/23/13   Biagio Borg, MD  furosemide (LASIX) 40 MG tablet Take 1 tablet (40 mg total) by mouth daily. 11/12/15   Imogene Burn, PA-C  JANUVIA 100 MG tablet take 1 tablet by mouth once daily 06/23/16   Biagio Borg, MD  ketoconazole (NIZORAL) 2 % shampoo Apply 1 application topically 4 (four) times a week.    [provider]  mirabegron ER (MYRBETRIQ) 50 MG TB24 tablet Take 50 mg by mouth daily.    [provider]  Multiple Vitamins-Minerals (ONCOVITE PO) Take 1 tablet by mouth daily.     [provider]  pantoprazole (PROTONIX) 40 MG tablet take 1 tablet by mouth once daily 02/12/16   Biagio Borg, MD  potassium chloride (K-DUR,KLOR-CON) 10 MEQ tablet take 1 tablet by mouth once daily 07/02/16   Dorothy Spark, MD  predniSONE (DELTASONE) 10 MG tablet Take 10-30 mg by mouth daily with breakfast. Pt is to take as a taper:  Three tablets for 3 days, two tablets for 3 days, one tablet for 3 days, then STOP. 07/07/16 07/16/16  [provider]  senna (SENOKOT) 8.6 MG tablet Take 1 tablet by mouth at bedtime.     [provider]  Tamsulosin HCl (FLOMAX) 0.4 MG CAPS Take 0.4 mg by mouth at bedtime.     [provider]  tiZANidine (ZANAFLEX) 4 MG tablet Take 1 tablet (4 mg total) by mouth every 6 (six) hours as needed for muscle spasms. 07/04/16   Biagio Borg, MD  traMADol (ULTRAM) 50 MG tablet Take 1 tablet (50 mg total) by mouth every 6 (six) hours as needed. Patient taking differently: Take 50 mg by mouth every 6 (six) hours as needed for moderate pain.  07/04/16   Biagio Borg, MD  traZODone (DESYREL) 50 MG tablet Take 1 tablet (50 mg total) by mouth See admin instructions. Patient taking differently: Take 50 mg by mouth at bedtime.  01/30/16   Biagio Borg, MD  warfarin (COUMADIN) 4 MG tablet Take 4-6 mg by mouth daily. Pt takes one and one-half tablet on Thursday and one tablet all other days.    [provider]    Family History Family History  Problem Relation Age of Onset  . Prostate cancer Father 28       passed with prostate ca  . Cancer Father   . Stroke Father   . Heart disease Mother   . Hypertension Mother   . Heart attack Mother     Social History Social History  Substance Use Topics  . Smoking status: Former Smoker    Packs/day: 1.00    Types: Cigarettes    Quit date: 03/04/1975  . Smokeless tobacco: Never Used  . Alcohol use No     Allergies   Metoprolol   Review of Systems Review of Systems  A complete 10 system review of systems was obtained and all systems are negative except as noted in the HPI and PMH.  Physical  Exam Updated Vital Signs BP (!) 193/90 (BP Location: Left Arm)   Pulse 85   Temp 97.8 F (36.6 C) (Oral)   Resp 20   Ht _0  (1.727 m)   Wt 210 lb (95.3 kg)   SpO2 99%   BMI 31.93 kg/m   Physical Exam  Constitutional: He is oriented to person, place, and time. He appears well-developed and well-nourished.  HENT:  Head: Normocephalic and atraumatic.  Eyes: EOM are normal.  Neck: Normal range of motion.  Cardiovascular: Normal rate, regular rhythm and normal heart sounds.   Pulmonary/Chest: Effort normal and breath sounds normal. No respiratory distress.  Abdominal: Soft. He exhibits no distension. There is no tenderness.  Musculoskeletal: Normal range of motion.  No significant thoracic point tenderness.  Mild lumbar tenderness.  5 out of 5 strength in bilateral lower extremity major muscle groups.  Normal PT and DP pulses bilaterally  Neurological: He is alert and oriented to person, place, and time.  Skin: Skin is warm and dry.  Psychiatric: He has a normal mood and affect. Judgment normal.  Nursing note and vitals reviewed.    ED Treatments / Results  DIAGNOSTIC STUDIES: Oxygen Saturation is 100% on RA, normal by my interpretation.   COORDINATION OF CARE: 1:54 AM-Discussed next steps with pt. Pt verbalized understanding and is agreeable with the plan.    Labs (all labs ordered are listed, but only abnormal results are displayed) Labs Reviewed  CBC - Abnormal; Notable for the following:       Result Value   WBC 17.6 (*)    RDW 15.9 (*)    All other components within normal limits  BASIC METABOLIC PANEL - Abnormal; Notable for the following:    Chloride 99 (*)    Glucose, Bld 146 (*)    BUN 21 (*)    All other components within normal limits    EKG  EKG Interpretation None       Radiology No results found.  Procedures Procedures (including critical care time)  Medications Ordered in ED Medications  morphine 4 MG/ML injection 6 mg (6 mg  Intravenous Given 07/14/16 0208)  ketorolac (TORADOL) 15 MG/ML injection 15 mg (15 mg Intravenous Given 07/14/16 0204)  diazepam (VALIUM) tablet 5 mg (5 mg Oral Given 07/14/16 0204)  morphine 4 MG/ML injection 6 mg (6 mg Intravenous Given 07/14/16 0626)     Initial Impression / Assessment and Plan / ED Course  I have reviewed the triage vital signs and the nursing notes.  Pertinent labs & imaging results that were available during my care of the patient were reviewed by me and considered in my medical decision making (see chart for details).     Patient still with ongoing discomfort and pain despite pain medication.  Given patient's advanced age and ongoing worsening back pain he will undergo MRI thoracic and lumbar spine for further evaluation.  He does have good range of motion of his lateral lower extremity major joints.  Normal strength in his lower extremities.  He is describing bilateral sciatica-like symptoms right greater than left.  Care to Dr Billy Fischer to follow up on MRI  Final Clinical Impressions(s) / ED Diagnoses   Final diagnoses:  Back pain    New Prescriptions New Prescriptions   No medications on file    I personally performed the services described in this documentation, which was scribed in my presence. The recorded information has been reviewed and is accurate.  Jola Schmidt, MD 07/14/16 [phone]

## 2016-07-14 NOTE — Discharge Instructions (Signed)
Take tylenol 1000mg  four times daily (every 6 hours) for pain for one week.

## 2016-07-14 NOTE — ED Provider Notes (Signed)
Received care of patient at [facility] from Dr. Venora Maples. Please see his note for prior care. Briefly, this is an 83yo male presenting with back pain. CT had showed mild compression fx of L2 at previous visit, but given worsening pain, MR ordered to evaluate for other abnormalities.  Mr thoracic and lumbar spine show mild compression fx of L2, no other abnormalities.  Discussed possibility of brace for comfort, however pt declines.  Recommend follow up with PCP, consider spine center follow up for further care. Given rx for oxycodone for pain, rec tylenol and ibuprofen. Patient discharged in stable condition with understanding of reasons to return.    Eric Morgan, MD 07/15/16 1434

## 2016-07-14 NOTE — ED Notes (Signed)
Patient is alert and oriented x3.  He was given DC instructions and follow up visit instructions.  Patient gave verbal understanding.  He was DC ambulatory under his own power to home.  V/S stable.  He was not showing any signs of distress on DC 

## 2016-07-14 NOTE — ED Notes (Signed)
Patient transported to MRI 

## 2016-07-15 ENCOUNTER — Ambulatory Visit (INDEPENDENT_AMBULATORY_CARE_PROVIDER_SITE_OTHER): Payer: Medicare Other | Admitting: Internal Medicine

## 2016-07-15 ENCOUNTER — Telehealth: Payer: Self-pay

## 2016-07-15 ENCOUNTER — Other Ambulatory Visit (INDEPENDENT_AMBULATORY_CARE_PROVIDER_SITE_OTHER): Payer: Medicare Other

## 2016-07-15 ENCOUNTER — Telehealth: Payer: Self-pay | Admitting: Internal Medicine

## 2016-07-15 ENCOUNTER — Encounter: Payer: Self-pay | Admitting: Internal Medicine

## 2016-07-15 VITALS — BP 136/78 | HR 88

## 2016-07-15 DIAGNOSIS — M5410 Radiculopathy, site unspecified: Secondary | ICD-10-CM

## 2016-07-15 DIAGNOSIS — S32020D Wedge compression fracture of second lumbar vertebra, subsequent encounter for fracture with routine healing: Secondary | ICD-10-CM

## 2016-07-15 DIAGNOSIS — N390 Urinary tract infection, site not specified: Secondary | ICD-10-CM

## 2016-07-15 DIAGNOSIS — R269 Unspecified abnormalities of gait and mobility: Secondary | ICD-10-CM | POA: Diagnosis not present

## 2016-07-15 DIAGNOSIS — M545 Low back pain: Secondary | ICD-10-CM

## 2016-07-15 DIAGNOSIS — E2839 Other primary ovarian failure: Secondary | ICD-10-CM

## 2016-07-15 DIAGNOSIS — S32020A Wedge compression fracture of second lumbar vertebra, initial encounter for closed fracture: Secondary | ICD-10-CM | POA: Insufficient documentation

## 2016-07-15 LAB — URINALYSIS, ROUTINE W REFLEX MICROSCOPIC
Ketones, ur: 15 — AB
Nitrite: NEGATIVE
Specific Gravity, Urine: >=1.03 — AB (ref 1.000–1.030)
Total Protein, Urine: 100 — AB
URINE GLUCOSE: NEGATIVE
Urobilinogen, UA: 1 (ref 0.0–1.0)
pH: 5.5 (ref 5.0–8.0)

## 2016-07-15 MED ORDER — FENTANYL 25 MCG/HR TD PT72
25.0000 ug | MEDICATED_PATCH | TRANSDERMAL | 0 refills | Status: DC
Start: 2016-07-15 — End: 2016-07-28

## 2016-07-15 MED ORDER — GABAPENTIN 100 MG PO CAPS: 100.0000 mg | ORAL_CAPSULE | Freq: Three times a day (TID) | ORAL | 3 refills | Status: AC

## 2016-07-15 MED ORDER — OXYCODONE HCL 5 MG PO TABS: 5.0000 mg | ORAL_TABLET | ORAL | 0 refills | Status: DC | PRN

## 2016-07-15 MED ORDER — KETOROLAC TROMETHAMINE 30 MG/ML IJ SOLN
30.0000 mg | Freq: Once | INTRAMUSCULAR | Status: AC
  Administered 2016-07-15: 30 mg via INTRAMUSCULAR

## 2016-07-15 NOTE — Patient Instructions (Signed)
You had the Toradol pain shot today  Please take all new medication as prescribed - the fentanyl low dose pain patch, and the oxycodone refill, as well as the gabapentin 100 mg three times per day  Please continue all other medications as before, and refills have been done if requested.  Please have the pharmacy call with any other refills you may need.  You will be contacted regarding the referral for: [facility]surg[facility]  Please keep your appointments with your specialists as you may have planned  Please schedule the bone density test before leaving today at the scheduling desk (where you check out)  Please go to the LAB in the Basement (turn left off the elevator) for the tests to be done today  You will be contacted by phone if any changes need to be made immediately.  Otherwise, you will receive a letter about your results with an explanation, but please check with MyChart first.  Please remember to sign up for MyChart if you have not done so, as this will be important to you in the future with finding out test results, communicating by private email, and scheduling acute appointments online when needed.  Please return in 1 week, or sooner if needed

## 2016-07-15 NOTE — Telephone Encounter (Signed)
Informed pt and he expressed understanding.

## 2016-07-15 NOTE — Assessment & Plan Note (Signed)
Also for UA, as well as PTH to r/o hyperpathyroid as cause of bone loss

## 2016-07-15 NOTE — Assessment & Plan Note (Addendum)
Likely acute to subacute and likely primary cause of his current pain; MRI reviewed with pt, for  toradol repeat today 30 mg IM, for fentanyl patch low dose with caveat of worsening constipation, cont oxycodone prn, refer NS for possible hyphoplasty, DXA to r/o osteoporosis, and HH with PT evaluation  Note:  Total time for pt hx, exam, review of record with pt in the room, determination of diagnoses and plan for further eval and tx is > 40 min, with over 50% spent in coordination and counseling of patient including the differential diagnosis, evaluation and treatment of lumbar compression fx, radiculitis, lower back pain in general

## 2016-07-15 NOTE — Telephone Encounter (Signed)
Unfortunately there is not much I can do about this, due to controlled substance nature and the law  On the good side, he did have the toradol shot today, and should be starting the fentanyl, so I was hoping he woul dnot need the oxycodone much as at all with the fentanyl patch

## 2016-07-15 NOTE — Telephone Encounter (Signed)
-----   Message from Biagio Borg, MD sent at 07/15/2016  1:08 PM EDT ----- Left message on MyChart, pt to cont same tx except  The test results show that your current treatment is OK, except there may be an infection trying to start again.  As you really did not have any symptoms today, we will wait for the urine culture results before starting antibiotic treatment.  Please call, however, if you new fever or urinary symptoms such as pain, frequency or blood.    Aruna Nestler to please inform pt, I will f/u urine cx

## 2016-07-15 NOTE — Progress Notes (Signed)
Subjective:    Patient ID: Eric Rivers, male    DOB: October 01, 1930, 83 y.o.   MRN: [id number]  HPI  Here to f/u with friend for transport and support; pt is miserable in pain predicament - "I want you to just tell me what to do. I dont want options"  Recently tx for UTI now symptomatically resolved with finished duricef, and Denies urinary symptoms such as dysuria, frequency, urgency, flank pain, hematuria or n/v, fever, chills.  Also with ongoing constipation but working on this with stool softner and daily senakot. + Large BM 2 days ago  Lower back spasms seemed to have improved but has severe midline pain after recent fall, seen in ED    Had some improvement initially after last seen in this office, but seen in ED yesterday with acute to subacute L2 compression fx.  Has persistent pain across the lower back today and Oxycodone 5 mg 1 q 4 hr prn only helps for 2 hrs.  No hx of extensive steroid use or osteoporosis.  Pt denies bladder change, fever, wt loss,  worsening LE mnubm or pain, but has had radiating pain right > left LE's. Past Medical History:  Diagnosis Date  . ACE-inhibitor cough   . Aortic dissection [facility])    Surgical repair February, 2014  . Aortic stenosis    mild....echo... september... 2010/ mild... echo.[name] KitchenMarland KitchenDec, 2011  . Atrial fibrillation (HCC)     Chronic,   24 hour holter, September, 2011.... atrial fib rate is controlled.... there is some bradycardia but  no marked pauses  . Bladder cancer [facility])    recurrence with TUR-B March '09  . DDD (degenerative disc disease)    lumbar spine  . Diabetes mellitus    type 2  . Diabetes mellitus with neuropathy ([place]) 12/04/2006   Qualifier: Diagnosis of  By: Linda Hedges MD, Heinz Knuckles  medications - DPP4   . Diverticulosis of colon with hemorrhage 01/01/2014  . Ejection fraction    EF 60%, echo, 01/2010  . Fluid overload   . Fracture of metacarpal bone 11/30/11  . GERD (gastroesophageal reflux disease)   . Gout   . History of  echocardiogram    Echo 5/17 - mild LVH, EF 55-60%, mod AS (mean 16 mmHg, peak 29 mmHg), MAC, midl MR, massive BAE, mod dilated RV, low normal RVSF, mod TR, PASP 64 mmHg  . Hx of colonoscopy    approx. 10 years  . Hypercholesterolemia   . Hyperlipidemia   . Hypertension   . Mitral regurgitation   . Normal nuclear stress test    06/2005 , also ABI normal 2007  . Prostate cancer ([place])    radiation tx.  . Pulmonary hypertension ([place])    69mHg, echo, 01/2010  . Shortness of breath    on exertion  . Warfarin anticoagulation    Atrial fib  . Whooping cough    Past Surgical History:  Procedure Laterality Date  . ASCENDING AORTIC ROOT REPLACEMENT N/A 04/15/2012   Procedure: ASCENDING AORTIC ROOT REPLACEMENT;  Surgeon: BGaye Pollack MD;  Location: [facility]  Service: Open Heart Surgery;  Laterality: N/A;  . bladder cancer biopsy  10/16/2010   negative for malignancy  . bladder microscopic  04/13/2007   high grade papillary urothelial lesions  . COLONOSCOPY N/A 01/01/2014   Procedure: COLONOSCOPY;  Surgeon: CGatha Mayer MD;  Location: [facility];  Service: Endoscopy;  Laterality: N/A;  . CYSTOSCOPY/RETROGRADE/URETEROSCOPY Bilateral 04/04/2013   Procedure: CYSTOSCOPY WITH RETROGRADE PYELOGRAM  AND BLADDER BIOPSY;  Surgeon: Molli Hazard, MD;  Location: [facility];  Service: Urology;  Laterality: Bilateral;  . 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 EXPLORATION POST OPERATIVE OPEN HEART  04/2012  . Herniatic disc surgery    . LUMBAR LAMINECTOMY     '02  . PROSTATE BIOPSY  09/25/2011   GLEASON 3+3=6 AND 4+3=7  . TEE WITHOUT CARDIOVERSION N/A 04/15/2012   Procedure: TRANSESOPHAGEAL ECHOCARDIOGRAM (TEE);  Surgeon: Gaye Pollack, MD;  Location: [place];  Service: Open Heart Surgery;  Laterality: N/A;  . TRANSURETHRAL RESECTION OF BLADDER TUMOR N/A 06/13/2015   Procedure: TRANSURETHRAL RESECTION OF PROSTATE (TURP) CLOT EVACUATION AND FULGERATION, BLADDER STONE  REMOVAL ;  Surgeon: Alexis Frock, MD;  Location: [facility];  Service: Urology;  Laterality: N/A;  . TUR-BT '06, '09      reports that he quit smoking about 41 years ago. His smoking use included Cigarettes. He smoked 1.00 pack per day. He has never used smokeless tobacco. He reports that he does not drink alcohol or use drugs. family history includes Cancer in his father; Heart attack in his mother; Heart disease in his mother; Hypertension in his mother; Prostate cancer (age of onset: 68) in his father; Stroke in his father. Allergies  Allergen Reactions  . Metoprolol Other (See Comments)    Reaction:  Bradycardia with 69m BID dose   Current Outpatient Prescriptions on File Prior to Visit  Medication Sig Dispense Refill  . acetaminophen (TYLENOL) 500 MG tablet Take 1,000 mg by mouth every 6 (six) hours as needed for mild pain, moderate pain, fever or headache.     . allopurinol (ZYLOPRIM) 300 MG tablet take 1 tablet by mouth once daily 90 tablet 3  . amLODipine (NORVASC) 2.5 MG tablet Take 2.5 mg by mouth daily.   0  . aspirin EC 325 MG tablet Take 325 mg by mouth every morning.     .[name] Kitchenatorvastatin (LIPITOR) 20 MG tablet take 1 tablet by mouth once daily 90 tablet 3  . Calcium Carbonate-Vitamin D (CALCIUM 600+D) 600-400 MG-UNIT tablet Take 1 tablet by mouth at bedtime.    .[name] Kitchendesonide (DESOWEN) 0.05 % lotion Apply 1 application topically daily as needed (rosacea).     . docusate sodium (COLACE) 100 MG capsule Take 1 capsule (100 mg total) by mouth every 12 (twelve) hours. 60 capsule 0  . ferrous sulfate 325 (65 FE) MG tablet Take 650 mg by mouth daily with breakfast.    . finasteride (PROSCAR) 5 MG tablet Take 1 tablet (5 mg total) by mouth every morning. 90 tablet 3  . furosemide (LASIX) 40 MG tablet Take 1 tablet (40 mg total) by mouth daily. 90 tablet 1  . JANUVIA 100 MG tablet take 1 tablet by mouth once daily 90 tablet 2  . ketoconazole (NIZORAL) 2 % shampoo Apply 1 application topically 4  (four) times a week.    . mirabegron ER (MYRBETRIQ) 50 MG TB24 tablet Take 50 mg by mouth daily.    . Multiple Vitamins-Minerals (ONCOVITE PO) Take 1 tablet by mouth daily.     . pantoprazole (PROTONIX) 40 MG tablet take 1 tablet by mouth once daily 90 tablet 3  . potassium chloride (K-DUR,KLOR-CON) 10 MEQ tablet take 1 tablet by mouth once daily 90 tablet 2  . senna (SENOKOT) 8.6 MG tablet Take 1 tablet by mouth at bedtime.     . Tamsulosin HCl (FLOMAX) 0.4 MG CAPS Take  0.4 mg by mouth at bedtime.     [name] Kitchen tiZANidine (ZANAFLEX) 4 MG tablet Take 1 tablet (4 mg total) by mouth every 6 (six) hours as needed for muscle spasms. 30 tablet 1  . traMADol (ULTRAM) 50 MG tablet Take 1 tablet (50 mg total) by mouth every 6 (six) hours as needed. (Patient taking differently: Take 50 mg by mouth every 6 (six) hours as needed for moderate pain. ) 60 tablet 01  . traZODone (DESYREL) 50 MG tablet Take 1 tablet (50 mg total) by mouth See admin instructions. (Patient taking differently: Take 50 mg by mouth at bedtime. ) 90 tablet 1  . warfarin (COUMADIN) 4 MG tablet Take 4-6 mg by mouth daily. Pt takes one and one-half tablet on Thursday and one tablet all other days.     No current facility-administered medications on file prior to visit.    Review of Systems  Constitutional: Negative for other unusual diaphoresis or sweats HENT: Negative for ear discharge or swelling Eyes: Negative for other worsening visual disturbances Respiratory: Negative for stridor or other swelling  Gastrointestinal: Negative for worsening distension or other blood Genitourinary: Negative for retention or other urinary change Musculoskeletal: Negative for other MSK pain or swelling Skin: Negative for color change or other new lesions Neurological: Negative for worsening tremors and other numbness  Psychiatric/Behavioral: Negative for worsening agitation or other fatigue ALl other system neg per pt    Objective:   Physical Exam BP  136/78   Pulse 88   SpO2 98%  VS noted, examined in wheelchair Constitutional: Pt appears in NAD HENT: Head: NCAT.  Right Ear: External ear normal.  Left Ear: External ear normal.  Eyes: . Pupils are equal, round, and reactive to light. Conjunctivae and EOM are normal Nose: without d/c or deformity Neck: Neck supple. Gross normal ROM Cardiovascular: Normal rate and regular rhythm.   Pulmonary/Chest: Effort normal and breath sounds without rales or wheezing.  Abd:  Soft, NT, ND, + BS, no organomegaly, no flank tender Neurological: Pt is alert. At baseline orientation, motor 5/5 intact though has general weakness Skin: Skin is warm. No rashes, other new lesions, no LE edema Psychiatric: Pt behavior is normal without agitation  No other exam findings  Lab Results  Component Value Date   WBC 17.6 (H) 07/14/2016   HGB 13.6 07/14/2016   HCT 41.2 07/14/2016   PLT 249 07/14/2016   GLUCOSE 146 (H) 07/14/2016   CHOL 102 04/10/2015   TRIG 57.0 04/10/2015   HDL 45.60 04/10/2015   LDLCALC 45 04/10/2015   ALT 27 07/10/2016   AST 38 07/10/2016   NA 136 07/14/2016   K 3.8 07/14/2016   CL 99 (L) 07/14/2016   CREATININE 0.84 07/14/2016   BUN 21 (H) 07/14/2016   CO2 23 07/14/2016   TSH 1.48 04/10/2015   PSA 2.19 12/06/2007   INR 3.0 (H) 07/07/2016   HGBA1C 6.6 (H) 04/10/2015   MICROALBUR 4.1 (H) 04/10/2015   07/14/2016 MRI THORACIC AND LUMBAR SPINE WITHOUT CONTRAST - summary MR THORACIC SPINE IMPRESSION  No significant abnormalities of the thoracic spine. Chronic thoracolumbar aortic dissection and aneurysmal dilatation of the ascending thoracic aorta.  MR LUMBAR SPINE IMPRESSION  Benign appearing acute or subacute compression fracture of L2 without neural impingement.  Degenerative disc disease primarily at L4-5 with moderate right foraminal stenosis which could affect the L4 nerve.    Assessment & Plan:

## 2016-07-15 NOTE — Telephone Encounter (Signed)
Called pt  LVM.

## 2016-07-15 NOTE — Telephone Encounter (Signed)
Pt was here to see Dr Jenny Reichmann today. He increased the dose on his Oxycodone 5mg  today and the pharmacy will not fill it because he had a lower dose filled yesterday from the hospital and they told him that he will have to wait for three days before it can be filled. He said that he will run out of what was prescribed from the hospital before the three days are up. Please advise. The pt would like a call back on the status of this.

## 2016-07-15 NOTE — Assessment & Plan Note (Signed)
Possible l4 right sided, for also trial gabapentin 100 tid

## 2016-07-15 NOTE — Assessment & Plan Note (Addendum)
With balance issue and recurring falls, for [facility] PT

## 2016-07-15 NOTE — Assessment & Plan Note (Signed)
Symptoms improved, but for f/u UA and cx given back pain

## 2016-07-16 ENCOUNTER — Other Ambulatory Visit: Payer: Self-pay | Admitting: Cardiology

## 2016-07-16 ENCOUNTER — Ambulatory Visit: Payer: Medicare Other | Admitting: Internal Medicine

## 2016-07-16 LAB — URINE CULTURE: ORGANISM ID, BACTERIA: NO GROWTH

## 2016-07-19 ENCOUNTER — Inpatient Hospital Stay (HOSPITAL_COMMUNITY)
Admission: EM | Admit: 2016-07-19 | Discharge: 2016-07-28 | DRG: 516 | Disposition: A | Discharge status: Skilled Nursing Facility | Payer: Medicare Other | Attending: Internal Medicine | Admitting: Internal Medicine

## 2016-07-19 ENCOUNTER — Emergency Department (HOSPITAL_COMMUNITY): Payer: Medicare Other

## 2016-07-19 ENCOUNTER — Encounter (HOSPITAL_COMMUNITY): Payer: Self-pay | Admitting: *Deleted

## 2016-07-19 DIAGNOSIS — M4856XA Collapsed vertebra, not elsewhere classified, lumbar region, initial encounter for fracture: Principal | ICD-10-CM | POA: Diagnosis present

## 2016-07-19 DIAGNOSIS — E1140 Type 2 diabetes mellitus with diabetic neuropathy, unspecified: Secondary | ICD-10-CM | POA: Diagnosis not present

## 2016-07-19 DIAGNOSIS — Z7982 Long term (current) use of aspirin: Secondary | ICD-10-CM

## 2016-07-19 DIAGNOSIS — Z1619 Resistance to other specified beta lactam antibiotics: Secondary | ICD-10-CM | POA: Diagnosis present

## 2016-07-19 DIAGNOSIS — Z7901 Long term (current) use of anticoagulants: Secondary | ICD-10-CM

## 2016-07-19 DIAGNOSIS — D689 Coagulation defect, unspecified: Secondary | ICD-10-CM | POA: Diagnosis not present

## 2016-07-19 DIAGNOSIS — N390 Urinary tract infection, site not specified: Secondary | ICD-10-CM | POA: Diagnosis present

## 2016-07-19 DIAGNOSIS — Z87891 Personal history of nicotine dependence: Secondary | ICD-10-CM

## 2016-07-19 DIAGNOSIS — I482 Chronic atrial fibrillation, unspecified: Secondary | ICD-10-CM | POA: Diagnosis present

## 2016-07-19 DIAGNOSIS — I350 Nonrheumatic aortic (valve) stenosis: Secondary | ICD-10-CM | POA: Diagnosis present

## 2016-07-19 DIAGNOSIS — N3001 Acute cystitis with hematuria: Secondary | ICD-10-CM

## 2016-07-19 DIAGNOSIS — M5489 Other dorsalgia: Secondary | ICD-10-CM | POA: Diagnosis not present

## 2016-07-19 DIAGNOSIS — M549 Dorsalgia, unspecified: Secondary | ICD-10-CM

## 2016-07-19 DIAGNOSIS — Z923 Personal history of irradiation: Secondary | ICD-10-CM

## 2016-07-19 DIAGNOSIS — K8020 Calculus of gallbladder without cholecystitis without obstruction: Secondary | ICD-10-CM | POA: Diagnosis not present

## 2016-07-19 DIAGNOSIS — R52 Pain, unspecified: Secondary | ICD-10-CM | POA: Diagnosis not present

## 2016-07-19 DIAGNOSIS — Z79899 Other long term (current) drug therapy: Secondary | ICD-10-CM

## 2016-07-19 DIAGNOSIS — M47816 Spondylosis without myelopathy or radiculopathy, lumbar region: Secondary | ICD-10-CM | POA: Diagnosis present

## 2016-07-19 DIAGNOSIS — N400 Enlarged prostate without lower urinary tract symptoms: Secondary | ICD-10-CM | POA: Diagnosis present

## 2016-07-19 DIAGNOSIS — T45515A Adverse effect of anticoagulants, initial encounter: Secondary | ICD-10-CM | POA: Diagnosis present

## 2016-07-19 DIAGNOSIS — M4316 Spondylolisthesis, lumbar region: Secondary | ICD-10-CM | POA: Diagnosis present

## 2016-07-19 DIAGNOSIS — I5032 Chronic diastolic (congestive) heart failure: Secondary | ICD-10-CM | POA: Diagnosis present

## 2016-07-19 DIAGNOSIS — M545 Low back pain: Secondary | ICD-10-CM | POA: Diagnosis not present

## 2016-07-19 DIAGNOSIS — M109 Gout, unspecified: Secondary | ICD-10-CM | POA: Diagnosis present

## 2016-07-19 DIAGNOSIS — E785 Hyperlipidemia, unspecified: Secondary | ICD-10-CM | POA: Diagnosis present

## 2016-07-19 DIAGNOSIS — K219 Gastro-esophageal reflux disease without esophagitis: Secondary | ICD-10-CM | POA: Diagnosis present

## 2016-07-19 DIAGNOSIS — Z87440 Personal history of urinary (tract) infections: Secondary | ICD-10-CM

## 2016-07-19 DIAGNOSIS — E7800 Pure hypercholesterolemia, unspecified: Secondary | ICD-10-CM | POA: Diagnosis present

## 2016-07-19 DIAGNOSIS — Z8546 Personal history of malignant neoplasm of prostate: Secondary | ICD-10-CM

## 2016-07-19 DIAGNOSIS — Z66 Do not resuscitate: Secondary | ICD-10-CM | POA: Diagnosis present

## 2016-07-19 DIAGNOSIS — S32020A Wedge compression fracture of second lumbar vertebra, initial encounter for closed fracture: Secondary | ICD-10-CM | POA: Diagnosis present

## 2016-07-19 DIAGNOSIS — I272 Pulmonary hypertension, unspecified: Secondary | ICD-10-CM | POA: Diagnosis present

## 2016-07-19 DIAGNOSIS — M5440 Lumbago with sciatica, unspecified side: Secondary | ICD-10-CM

## 2016-07-19 DIAGNOSIS — R627 Adult failure to thrive: Secondary | ICD-10-CM | POA: Diagnosis present

## 2016-07-19 DIAGNOSIS — D6832 Hemorrhagic disorder due to extrinsic circulating anticoagulants: Secondary | ICD-10-CM | POA: Diagnosis present

## 2016-07-19 DIAGNOSIS — Z8551 Personal history of malignant neoplasm of bladder: Secondary | ICD-10-CM

## 2016-07-19 DIAGNOSIS — I110 Hypertensive heart disease with heart failure: Secondary | ICD-10-CM | POA: Diagnosis present

## 2016-07-19 DIAGNOSIS — R001 Bradycardia, unspecified: Secondary | ICD-10-CM | POA: Diagnosis not present

## 2016-07-19 DIAGNOSIS — G8929 Other chronic pain: Secondary | ICD-10-CM | POA: Diagnosis present

## 2016-07-19 DIAGNOSIS — Z9079 Acquired absence of other genital organ(s): Secondary | ICD-10-CM

## 2016-07-19 LAB — URINALYSIS, ROUTINE W REFLEX MICROSCOPIC
Bilirubin Urine: NEGATIVE
Glucose, UA: NEGATIVE mg/dL
KETONES UR: 80 mg/dL — AB
NITRITE: NEGATIVE
PH: 7 (ref 5.0–8.0)
Protein, ur: 30 mg/dL — AB
Specific Gravity, Urine: 1.014 (ref 1.005–1.030)

## 2016-07-19 MED ORDER — HYDROMORPHONE HCL 1 MG/ML IJ SOLN
1.0000 mg | Freq: Once | INTRAMUSCULAR | Status: AC
  Administered 2016-07-19: 1 mg via INTRAVENOUS
  Filled 2016-07-19: qty 1

## 2016-07-19 MED ORDER — HYDROCODONE-ACETAMINOPHEN 5-325 MG PO TABS: 1.0000 | ORAL_TABLET | Freq: Once | ORAL | Status: DC

## 2016-07-19 MED ORDER — CEPHALEXIN 500 MG PO CAPS: 500.0000 mg | ORAL_CAPSULE | Freq: Once | ORAL | Status: DC

## 2016-07-19 MED ORDER — DEXTROSE 5 % IV SOLN
2.0000 g | Freq: Once | INTRAVENOUS | Status: AC
  Administered 2016-07-19: 2 g via INTRAVENOUS
  Filled 2016-07-19: qty 2

## 2016-07-19 MED ORDER — LORAZEPAM 2 MG/ML IJ SOLN
1.0000 mg | Freq: Once | INTRAMUSCULAR | Status: AC
  Administered 2016-07-19: 1 mg via INTRAVENOUS
  Filled 2016-07-19: qty 1

## 2016-07-19 MED ORDER — MORPHINE SULFATE (PF) 4 MG/ML IV SOLN
4.0000 mg | Freq: Once | INTRAVENOUS | Status: AC
  Administered 2016-07-19: 4 mg via INTRAVENOUS
  Filled 2016-07-19: qty 1

## 2016-07-19 MED ORDER — ONDANSETRON HCL 4 MG/2ML IJ SOLN
4.0000 mg | Freq: Once | INTRAMUSCULAR | Status: AC
  Administered 2016-07-19: 4 mg via INTRAVENOUS
  Filled 2016-07-19: qty 2

## 2016-07-19 MED ORDER — SODIUM CHLORIDE 0.9 % IV BOLUS (SEPSIS)
1000.0000 mL | Freq: Once | INTRAVENOUS | Status: AC
  Administered 2016-07-19: 1000 mL via INTRAVENOUS

## 2016-07-19 NOTE — ED Notes (Signed)
Pt had an episode of desat into the 60's.  Pt was arousable to tactile stimuli.  Pt placed on NRB upon which time the Spo2 increased to 100%.  Left pt on NRB at this time.  Pt is on monitoring.  Report given to MT RN

## 2016-07-19 NOTE — ED Provider Notes (Signed)
[facility] DEPT Provider Note   CSN: [id number] Arrival date & time: 07/19/16  2007     History   Chief Complaint Chief Complaint  Patient presents with  . Back Pain    HPI Eric Rivers is a 83 y.o. male.  Pt presents to the ED today with back pain.  The pt was here on 5/14 and was diagnosed with a subacute L2 compression fracture.  He did not want a brace at the time.  He was d/c with oxycodone.  Pt saw his pcp on 5/15 who increased his pain meds, but he could not get those filled right away.  The pt continues to have severe pain in his back.  He denies any recent trauma.  When he arrived, his depend was soaked and smelled foul.      Past Medical History:  Diagnosis Date  . ACE-inhibitor cough   . Aortic dissection [facility])    Surgical repair February, 2014  . Aortic stenosis    mild....echo... september... 2010/ mild... echo.[name] KitchenMarland KitchenDec, 2011  . Atrial fibrillation (HCC)     Chronic,   24 hour holter, September, 2011.... atrial fib rate is controlled.... there is some bradycardia but  no marked pauses  . Bladder cancer [facility])    recurrence with TUR-B March '09  . DDD (degenerative disc disease)    lumbar spine  . Diabetes mellitus    type 2  . Diabetes mellitus with neuropathy ([place]) 12/04/2006   Qualifier: Diagnosis of  By: Linda Hedges MD, Heinz Knuckles  medications - DPP4   . Diverticulosis of colon with hemorrhage 01/01/2014  . Ejection fraction    EF 60%, echo, 01/2010  . Fluid overload   . Fracture of metacarpal bone 11/30/11  . GERD (gastroesophageal reflux disease)   . Gout   . History of echocardiogram    Echo 5/17 - mild LVH, EF 55-60%, mod AS (mean 16 mmHg, peak 29 mmHg), MAC, midl MR, massive BAE, mod dilated RV, low normal RVSF, mod TR, PASP 64 mmHg  . Hx of colonoscopy    approx. 10 years  . Hypercholesterolemia   . Hyperlipidemia   . Hypertension   . Mitral regurgitation   . Normal nuclear stress test    06/2005 , also ABI normal 2007  . Prostate cancer  ([place])    radiation tx.  . Pulmonary hypertension ([place])    26mHg, echo, 01/2010  . Shortness of breath    on exertion  . Warfarin anticoagulation    Atrial fib  . Whooping cough     Patient Active Problem List   Diagnosis Date Noted  . UTI (urinary tract infection) 07/20/2016  . Intractable back pain 07/20/2016  . Closed compression fracture of L2 lumbar vertebra ([place] 07/15/2016  . Radiculitis 07/15/2016  . Gait disorder 07/15/2016  . Acute UTI 07/07/2016  . Low back pain 07/04/2016  . Left-sided nosebleed 06/12/2016  . Epistaxis 04/11/2016  . Degenerative arthritis of right knee 09/20/2015  . Pronation deformity of both feet 09/20/2015  . Acute on chronic diastolic heart failure ([place] 09/19/2015  . Dyspnea 09/07/2015  . Peripheral edema 09/07/2015  . Right knee pain 09/07/2015  . Hematuria 06/13/2015  . AKI (acute kidney injury) ([place] 06/02/2015  . Elevated INR 06/01/2015  . Anemia 06/01/2015  . Hemorrhagic cystitis 06/01/2015  . Cough 04/20/2014  . Bradycardia 01/06/2014  . Diverticulosis of colon with hemorrhage 01/01/2014  . Perianal dermatitis 01/01/2014  . Chronic anemia 12/29/2013  . Lower GI bleed 12/29/2013  .  Warfarin-induced coagulopathy ([place]) 12/29/2013  . Acute blood loss anemia 12/29/2013  . Chronic diastolic CHF (congestive heart failure) ([place]) 11/02/2013  . Syncope 07/19/2013  . Advanced care planning/counseling discussion 04/12/2013  . Encounter for therapeutic drug monitoring 04/12/2013  . Insomnia 04/11/2013  . Mitral regurgitation   . Ascending aortic dissection ([place]) 04/16/2012  . Malignant neoplasm of prostate ([place]) 10/27/2011  . Warfarin anticoagulation   . Hypertension   . GERD (gastroesophageal reflux disease)   . Aortic stenosis   . Pulmonary hypertension ([place])   . Hyperlipidemia   . Ejection fraction   . Normal nuclear stress test   . Routine general medical examination at a health care facility 08/22/2010  . Bladder cancer ([place])     . Gout   . Chronic atrial fibrillation ([place])   . HYPERTROPHY PROSTATE W/O UR OBST & OTH LUTS 09/07/2007  . DEGENERATIVE JOINT DISEASE, GENERALIZED 09/07/2007  . NEOP, MALIGNANT, BLADDER NEC 12/04/2006  . Diabetes mellitus with neuropathy ([place]) 12/04/2006    Past Surgical History:  Procedure Laterality Date  . ASCENDING AORTIC ROOT REPLACEMENT N/A 04/15/2012   Procedure: ASCENDING AORTIC ROOT REPLACEMENT;  Surgeon: Gaye Pollack, MD;  Location: [facility];  Service: Open Heart Surgery;  Laterality: N/A;  . bladder cancer biopsy  10/16/2010   negative for malignancy  . bladder microscopic  04/13/2007   high grade papillary urothelial lesions  . COLONOSCOPY N/A 01/01/2014   Procedure: COLONOSCOPY;  Surgeon: Gatha Mayer, MD;  Location: [facility];  Service: Endoscopy;  Laterality: N/A;  . CYSTOSCOPY/RETROGRADE/URETEROSCOPY Bilateral 04/04/2013   Procedure: CYSTOSCOPY WITH RETROGRADE PYELOGRAM AND BLADDER BIOPSY;  Surgeon: Molli Hazard, MD;  Location: [facility];  Service: Urology;  Laterality: Bilateral;  . 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 EXPLORATION POST OPERATIVE OPEN HEART  04/2012  . Herniatic disc surgery    . LUMBAR LAMINECTOMY     '02  . PROSTATE BIOPSY  09/25/2011   GLEASON 3+3=6 AND 4+3=7  . TEE WITHOUT CARDIOVERSION N/A 04/15/2012   Procedure: TRANSESOPHAGEAL ECHOCARDIOGRAM (TEE);  Surgeon: Gaye Pollack, MD;  Location: [facility];  Service: Open Heart Surgery;  Laterality: N/A;  . TRANSURETHRAL RESECTION OF BLADDER TUMOR N/A 06/13/2015   Procedure: TRANSURETHRAL RESECTION OF PROSTATE (TURP) CLOT EVACUATION AND FULGERATION, BLADDER STONE REMOVAL ;  Surgeon: Alexis Frock, MD;  Location: [facility];  Service: Urology;  Laterality: N/A;  . TUR-BT '06, '09         Home Medications    Prior to Admission medications   Medication Sig Start Date End Date Taking? Authorizing Provider  acetaminophen (TYLENOL) 500 MG tablet Take 1,000 mg by  mouth every 8 (eight) hours as needed for mild pain, moderate pain, fever or headache.    Yes [provider]  allopurinol (ZYLOPRIM) 300 MG tablet take 1 tablet by mouth once daily 02/12/16  Yes Biagio Borg, MD  amLODipine (NORVASC) 2.5 MG tablet take 1 tablet by mouth once daily 07/16/16  Yes Dorothy Spark, MD  aspirin EC 325 MG tablet Take 325 mg by mouth every morning.    Yes [provider]  atorvastatin (LIPITOR) 20 MG tablet take 1 tablet by mouth once daily 02/12/16  Yes Biagio Borg, MD  Calcium Carbonate-Vitamin D (CALCIUM 600+D) 600-400 MG-UNIT tablet Take 1 tablet by mouth at bedtime.   Yes [provider]  desonide (DESOWEN) 0.05 % lotion Apply 1 application topically daily as needed (rosacea).  Yes [provider]  docusate sodium (COLACE) 100 MG capsule Take 1 capsule (100 mg total) by mouth every 12 (twelve) hours. 07/10/16  Yes Mesner, [name], MD  fentaNYL (DURAGESIC) 25 MCG/HR patch Place 1 patch (25 mcg total) onto the skin every 3 (three) days. 07/15/16  Yes Biagio Borg, MD  ferrous sulfate 325 (65 FE) MG tablet Take 650 mg by mouth daily with breakfast.   Yes [provider]  finasteride (PROSCAR) 5 MG tablet Take 1 tablet (5 mg total) by mouth every morning. 12/23/13  Yes Biagio Borg, MD  furosemide (LASIX) 40 MG tablet Take 1 tablet (40 mg total) by mouth daily. 11/12/15  Yes Imogene Burn, PA-C  gabapentin (NEURONTIN) 100 MG capsule Take 1 capsule (100 mg total) by mouth 3 (three) times daily. 07/15/16  Yes Biagio Borg, MD  JANUVIA 100 MG tablet take 1 tablet by mouth once daily 06/23/16  Yes Biagio Borg, MD  ketoconazole (NIZORAL) 2 % shampoo Apply 1 application topically 4 (four) times a week.   Yes [provider]  mirabegron ER (MYRBETRIQ) 50 MG TB24 tablet Take 50 mg by mouth daily.   Yes [provider]  Multiple Vitamins-Minerals (ONCOVITE PO) Take 1 tablet by mouth daily.    Yes [provider]  oxyCODONE (ROXICODONE) 5 MG immediate release tablet Take 1 tablet (5 mg total) by mouth every 4 (four) hours as needed for severe pain. 07/15/16  Yes Biagio Borg, MD  pantoprazole (PROTONIX) 40 MG tablet take 1 tablet by mouth once daily 02/12/16  Yes Biagio Borg, MD  potassium chloride (K-DUR,KLOR-CON) 10 MEQ tablet take 1 tablet by mouth once daily 07/02/16  Yes Dorothy Spark, MD  senna (SENOKOT) 8.6 MG tablet Take 1 tablet by mouth at bedtime.    Yes [provider]  Tamsulosin HCl (FLOMAX) 0.4 MG CAPS Take 0.4 mg by mouth at bedtime.    Yes [provider]  tiZANidine (ZANAFLEX) 4 MG tablet Take 1 tablet (4 mg total) by mouth every 6 (six) hours as needed for muscle spasms. 07/04/16  Yes Biagio Borg, MD  traMADol (ULTRAM) 50 MG tablet Take 1 tablet (50 mg total) by mouth every 6 (six) hours as needed. Patient taking differently: Take 50 mg by mouth every 6 (six) hours as needed for moderate pain.  07/04/16  Yes Biagio Borg, MD  traZODone (DESYREL) 50 MG tablet Take 1 tablet (50 mg total) by mouth See admin instructions. Patient taking differently: Take 50 mg by mouth at bedtime.  01/30/16  Yes Biagio Borg, MD  warfarin (COUMADIN) 4 MG tablet Take 4-6 mg by mouth daily. Pt takes one and one-half tablet (36m) on Thursday and one tablet (442m all other days.   Yes [provider]    Family History Family History  Problem Relation Age of Onset  . Prostate cancer Father [id number]     passed with prostate ca  . Cancer Father   . Stroke Father   . Heart disease Mother   . Hypertension Mother   . Heart attack Mother     Social History Social History  Substance Use Topics  . Smoking status: Former Smoker    Packs/day: 1.00    Types: Cigarettes    Quit date: 03/04/1975  . Smokeless tobacco: Never Used  . Alcohol use No     Allergies   Metoprolol   Review of Systems Review of Systems  Genitourinary: Positive for dysuria.    Musculoskeletal: Positive for back pain.  All other systems reviewed and are negative.    Physical Exam Updated Vital Signs BP 135/76 (BP Location: Left Arm)   Pulse 77   Temp 97.9 F (36.6 C) (Oral)   Resp 19   Ht _0  (1.727 m)   Wt 196 lb 13.9 oz (89.3 kg)   SpO2 100%   BMI 29.93 kg/m   Physical Exam  Constitutional: He appears well-developed. He appears distressed.  HENT:  Head: Normocephalic and atraumatic.  Right Ear: External ear normal.  Left Ear: External ear normal.  Nose: Nose normal.  Mouth/Throat: Oropharynx is clear and moist.  Eyes: Conjunctivae and EOM are normal. Pupils are equal, round, and reactive to light.  Neck: Normal range of motion. Neck supple.  Cardiovascular: Normal rate, regular rhythm, normal heart sounds and intact distal pulses.   Pulmonary/Chest: Effort normal and breath sounds normal.  Abdominal: Soft. Bowel sounds are normal.  Musculoskeletal:  Mild bruising to back  Neurological: He is alert.  Skin: Skin is warm.  Psychiatric: He has a normal mood and affect. His behavior is normal. Judgment and thought content normal.  Nursing note and vitals reviewed.    ED Treatments / Results  Labs (all labs ordered are listed, but only abnormal results are displayed) Labs Reviewed  URINALYSIS, ROUTINE W REFLEX MICROSCOPIC - Abnormal; Notable for the following:       Result Value   APPearance HAZY (*)    Hgb urine dipstick LARGE (*)    Ketones, ur 80 (*)    Protein, ur 30 (*)    Leukocytes, UA LARGE (*)    Bacteria, UA RARE (*)    Squamous Epithelial / LPF 0-5 (*)    All other components within normal limits  BASIC METABOLIC PANEL - Abnormal; Notable for the following:    Glucose, Bld 126 (*)    Calcium 8.6 (*)    All other components within normal limits  CBC WITH DIFFERENTIAL/PLATELET - Abnormal; Notable for the following:    WBC 12.2 (*)    RDW 16.0 (*)    Neutro Abs 9.7 (*)    All other components within normal limits   PROTIME-INR - Abnormal; Notable for the following:    Prothrombin Time 50.2 (*)    INR 5.31 (*)    All other components within normal limits  GLUCOSE, CAPILLARY - Abnormal; Notable for the following:    Glucose-Capillary 118 (*)    All other components within normal limits  GLUCOSE, CAPILLARY - Abnormal; Notable for the following:    Glucose-Capillary 128 (*)    All other components within normal limits  GLUCOSE, CAPILLARY - Abnormal; Notable for the following:    Glucose-Capillary 123 (*)    All other components within normal limits  URINE CULTURE    EKG  EKG Interpretation None       Radiology Dg Lumbar Spine Complete  Result Date: 07/19/2016 CLINICAL DATA:  Severe low back pain chronically. EXAM: LUMBAR SPINE - COMPLETE 4+ VIEW COMPARISON:  CT 07/10/2016 and 06/11/2015 FINDINGS: Diffuse osteopenia. Mild spondylosis throughout the lumbar spine. Subtle grade 1 anterolisthesis of L4 on L5 likely due to the moderate facet arthropathy. Disc space narrowing at the L4-5 level. Moderate L2 compression fracture unchanged from recent CT and new compared to 06/11/2015. Calcified plaque over the abdominal aorta. IMPRESSION: Moderate L2 compression fracture age indeterminate, unchanged from the recent CT and new since 06/11/2015. Mild spondylosis of  the lumbar spine with disc disease at the L4-5 level. Grade 1 anterolisthesis of L4 on L5 due to the moderate facet arthropathy. Electronically Signed   By: Marin Olp M.D.   On: 07/19/2016 21:04   Ct Renal Stone Study  Result Date: 07/19/2016 CLINICAL DATA:  Flank pain, possible UTI EXAM: CT ABDOMEN AND PELVIS WITHOUT CONTRAST TECHNIQUE: Multidetector CT imaging of the abdomen and pelvis was performed following the standard protocol without IV contrast. COMPARISON:  07/10/2016 FINDINGS: Lower chest: Small right pleural effusion. Hepatobiliary: Liver is grossly unremarkable. Layering small gallstones (series 2/ image 38). No associated  inflammatory changes. Pancreas: Within normal limits. Spleen: Within normal limits. Adrenals/Urinary Tract: Adrenal glands are within normal limits. Kidneys are within normal limits. No renal or ureteral calculi. No hydronephrosis. Bladder is underdistended. Stable high density material/ calcification along the posterior aspect of the bladder (series 2/ image 66) extending into a prostate TURP defect (series 2/image 71). Stomach/Bowel: Stomach is within normal limits. No evidence of bowel obstruction. Normal appendix (series 2/ image 54). Vascular/Lymphatic: Known thoracoabdominal aortic dissection is poorly evaluated on unenhanced CT, although a displaced intimal calcification is present. 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 Atherosclerotic calcifications of the abdominal aorta and branch vessels. No suspicious abdominopelvic lymphadenopathy. Reproductive: Brachytherapy seeds in the prostate. Postsurgical changes related to TURP defect with calcifications, as described above. Other: No abdominopelvic ascites. Tiny fat containing periumbilical hernia. Musculoskeletal: Moderate compression fracture deformity at L2 (sagittal image 108), with 50% loss of height, mildly progressed from recent CT. Minimal retropulsion of 1-2 mm. IMPRESSION: Moderate compression fracture deformity at L2, with 50% loss of height, mildly progressed from recent CT. Minimal retropulsion of 1-2 mm. Postsurgical changes related to TURP. Dystrophic calcification along the posterior bladder and extending into the TURP defect. Cholelithiasis, without associated inflammatory changes. Small right pleural effusion. 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 No findings suspicious for metastatic disease. Electronically Signed   By: Julian Hy M.D.   On: 07/19/2016 22:31    Procedures Procedures (including critical care time)  Medications Ordered in ED Medications  amLODipine  (NORVASC) tablet 2.5 mg (2.5 mg Oral Given 07/20/16 0933)  fentaNYL (DURAGESIC - dosed mcg/hr) patch 25 mcg (not administered)  gabapentin (NEURONTIN) capsule 100 mg (100 mg Oral Given 07/20/16 1636)  oxyCODONE (Oxy IR/ROXICODONE) immediate release tablet 5 mg (5 mg Oral Given 07/20/16 0932)  calcium-vitamin D (OSCAL WITH D) 500-200 MG-UNIT per tablet 1 tablet (not administered)  ferrous sulfate tablet 325 mg (325 mg Oral Given 07/20/16 1636)  tiZANidine (ZANAFLEX) tablet 4 mg (not administered)  potassium chloride (K-DUR,KLOR-CON) CR tablet 10 mEq (10 mEq Oral Given 07/20/16 0932)  allopurinol (ZYLOPRIM) tablet 300 mg (300 mg Oral Given 07/20/16 0933)  atorvastatin (LIPITOR) tablet 20 mg (20 mg Oral Given 07/20/16 1636)  pantoprazole (PROTONIX) EC tablet 40 mg (40 mg Oral Given 07/20/16 0933)  traZODone (DESYREL) tablet 50 mg (not administered)  furosemide (LASIX) tablet 40 mg (40 mg Oral Given 07/20/16 0933)  mirabegron ER (MYRBETRIQ) tablet 50 mg (50 mg Oral Given 07/20/16 0941)  senna (SENOKOT) tablet 17.2 mg (not administered)  finasteride (PROSCAR) tablet 5 mg (5 mg Oral Given 07/20/16 0932)  acetaminophen (TYLENOL) tablet 1,000 mg (1,000 mg Oral Given 07/20/16 0941)  tamsulosin (FLOMAX) capsule 0.4 mg (not administered)  multivitamin with minerals tablet 1 tablet (1 tablet Oral Given 07/20/16 0932)  ondansetron (ZOFRAN) tablet 4 mg (not administered)    Or  ondansetron (ZOFRAN)  injection 4 mg (not administered)  albuterol (PROVENTIL) (2.5 MG/3ML) 0.083% nebulizer solution 2.5 mg (not administered)  sodium chloride flush (NS) 0.9 % injection 3 mL (3 mLs Intravenous Given 07/20/16 0942)  sodium chloride flush (NS) 0.9 % injection 3 mL (not administered)  0.9 %  sodium chloride infusion (not administered)  polyethylene glycol (MIRALAX / GLYCOLAX) packet 17 g (not administered)  morphine 4 MG/ML injection 1 mg (not administered)  insulin aspart (novoLOG) injection 0-9 Units (0 Units Subcutaneous Not  Given 07/20/16 1636)  cefTRIAXone (ROCEPHIN) 1 g in dextrose 5 % 50 mL IVPB (not administered)  sodium chloride 0.9 % bolus 1,000 mL (0 mLs Intravenous Stopped 07/19/16 2245)  morphine 4 MG/ML injection 4 mg (4 mg Intravenous Given 07/19/16 2136)  ondansetron (ZOFRAN) injection 4 mg (4 mg Intravenous Given 07/19/16 2137)  HYDROmorphone (DILAUDID) injection 1 mg (1 mg Intravenous Given 07/19/16 2243)  LORazepam (ATIVAN) injection 1 mg (1 mg Intravenous Given 07/19/16 2248)  cefTRIAXone (ROCEPHIN) 2 g in dextrose 5 % 50 mL IVPB (0 g Intravenous Stopped 07/19/16 2313)     Initial Impression / Assessment and Plan / ED Course  I have reviewed the triage vital signs and the nursing notes.  Pertinent labs & imaging results that were available during my care of the patient were reviewed by me and considered in my medical decision making (see chart for details).     Labs pending and TLSO brace pending.  O2 sats dropped with pain meds.  Pt observed and signed out to Dr. Christy Gentles pending labs.  Final Clinical Impressions(s) / ED Diagnoses   Final diagnoses:  Closed compression fracture of second lumbar vertebra, initial encounter ([place])  Acute cystitis with hematuria  Coagulopathy [facility])    New Prescriptions Current Discharge Medication List       Isla Pence, MD 07/20/16 [phone]

## 2016-07-19 NOTE — ED Triage Notes (Signed)
Pt is here for lower back pain.  No recent trauma.  Pt had recent UTI.  He states that he is unsure if he completed antibiotics.  Very stong urine smell noted.  Pt wears depends and arrives in depends that are saturated with urine and once pt gets on ED stretcher this is also saturated in urine.

## 2016-07-19 NOTE — ED Notes (Signed)
Pt cleaned up and sheets changed and pt placed in hospital gown

## 2016-07-19 NOTE — ED Notes (Signed)
Bed: WA02 Expected date:  Expected time:  Means of arrival:  Comments: 86 m back pain and leg weakness.

## 2016-07-19 NOTE — ED Notes (Signed)
Pt is now resting comfortable.  Pt has been changed again as his depend was wet.  Pt appeared was very uncomfortable before this last dose of medications.  He was repositioned multiple times and remained agitated and could not get comfortable/  Pt was medicated, changed and repositioned for comfort and appears to be resting calmly at this time.  When asked about the pain he states that the medication took the edge off and that its a 5/10 pain at this time

## 2016-07-19 NOTE — ED Notes (Signed)
Changed brief on pt

## 2016-07-20 DIAGNOSIS — E7800 Pure hypercholesterolemia, unspecified: Secondary | ICD-10-CM | POA: Diagnosis present

## 2016-07-20 DIAGNOSIS — Z923 Personal history of irradiation: Secondary | ICD-10-CM | POA: Diagnosis not present

## 2016-07-20 DIAGNOSIS — M4856XA Collapsed vertebra, not elsewhere classified, lumbar region, initial encounter for fracture: Secondary | ICD-10-CM | POA: Diagnosis present

## 2016-07-20 DIAGNOSIS — M545 Low back pain: Secondary | ICD-10-CM | POA: Diagnosis not present

## 2016-07-20 DIAGNOSIS — Z9079 Acquired absence of other genital organ(s): Secondary | ICD-10-CM | POA: Diagnosis not present

## 2016-07-20 DIAGNOSIS — N3000 Acute cystitis without hematuria: Secondary | ICD-10-CM | POA: Diagnosis not present

## 2016-07-20 DIAGNOSIS — I272 Pulmonary hypertension, unspecified: Secondary | ICD-10-CM | POA: Diagnosis present

## 2016-07-20 DIAGNOSIS — S32000A Wedge compression fracture of unspecified lumbar vertebra, initial encounter for closed fracture: Secondary | ICD-10-CM | POA: Diagnosis not present

## 2016-07-20 DIAGNOSIS — T45515A Adverse effect of anticoagulants, initial encounter: Secondary | ICD-10-CM | POA: Diagnosis present

## 2016-07-20 DIAGNOSIS — Z8551 Personal history of malignant neoplasm of bladder: Secondary | ICD-10-CM | POA: Diagnosis not present

## 2016-07-20 DIAGNOSIS — I110 Hypertensive heart disease with heart failure: Secondary | ICD-10-CM | POA: Diagnosis present

## 2016-07-20 DIAGNOSIS — R791 Abnormal coagulation profile: Secondary | ICD-10-CM | POA: Diagnosis not present

## 2016-07-20 DIAGNOSIS — Z79899 Other long term (current) drug therapy: Secondary | ICD-10-CM | POA: Diagnosis not present

## 2016-07-20 DIAGNOSIS — S32020A Wedge compression fracture of second lumbar vertebra, initial encounter for closed fracture: Secondary | ICD-10-CM | POA: Diagnosis not present

## 2016-07-20 DIAGNOSIS — R319 Hematuria, unspecified: Secondary | ICD-10-CM | POA: Diagnosis not present

## 2016-07-20 DIAGNOSIS — E1140 Type 2 diabetes mellitus with diabetic neuropathy, unspecified: Secondary | ICD-10-CM | POA: Diagnosis present

## 2016-07-20 DIAGNOSIS — M47816 Spondylosis without myelopathy or radiculopathy, lumbar region: Secondary | ICD-10-CM | POA: Diagnosis present

## 2016-07-20 DIAGNOSIS — K219 Gastro-esophageal reflux disease without esophagitis: Secondary | ICD-10-CM | POA: Diagnosis present

## 2016-07-20 DIAGNOSIS — R2681 Unsteadiness on feet: Secondary | ICD-10-CM | POA: Diagnosis not present

## 2016-07-20 DIAGNOSIS — N390 Urinary tract infection, site not specified: Secondary | ICD-10-CM | POA: Diagnosis present

## 2016-07-20 DIAGNOSIS — S32020S Wedge compression fracture of second lumbar vertebra, sequela: Secondary | ICD-10-CM | POA: Diagnosis not present

## 2016-07-20 DIAGNOSIS — M109 Gout, unspecified: Secondary | ICD-10-CM | POA: Diagnosis present

## 2016-07-20 DIAGNOSIS — N400 Enlarged prostate without lower urinary tract symptoms: Secondary | ICD-10-CM | POA: Diagnosis present

## 2016-07-20 DIAGNOSIS — M4316 Spondylolisthesis, lumbar region: Secondary | ICD-10-CM | POA: Diagnosis present

## 2016-07-20 DIAGNOSIS — A419 Sepsis, unspecified organism: Secondary | ICD-10-CM | POA: Diagnosis not present

## 2016-07-20 DIAGNOSIS — Z87440 Personal history of urinary (tract) infections: Secondary | ICD-10-CM | POA: Diagnosis not present

## 2016-07-20 DIAGNOSIS — M549 Dorsalgia, unspecified: Secondary | ICD-10-CM

## 2016-07-20 DIAGNOSIS — I10 Essential (primary) hypertension: Secondary | ICD-10-CM | POA: Diagnosis not present

## 2016-07-20 DIAGNOSIS — D689 Coagulation defect, unspecified: Secondary | ICD-10-CM | POA: Diagnosis not present

## 2016-07-20 DIAGNOSIS — Z66 Do not resuscitate: Secondary | ICD-10-CM | POA: Diagnosis present

## 2016-07-20 DIAGNOSIS — I482 Chronic atrial fibrillation: Secondary | ICD-10-CM | POA: Diagnosis not present

## 2016-07-20 DIAGNOSIS — R29898 Other symptoms and signs involving the musculoskeletal system: Secondary | ICD-10-CM | POA: Diagnosis not present

## 2016-07-20 DIAGNOSIS — M5440 Lumbago with sciatica, unspecified side: Secondary | ICD-10-CM

## 2016-07-20 DIAGNOSIS — I5032 Chronic diastolic (congestive) heart failure: Secondary | ICD-10-CM | POA: Diagnosis not present

## 2016-07-20 DIAGNOSIS — Z1619 Resistance to other specified beta lactam antibiotics: Secondary | ICD-10-CM | POA: Diagnosis present

## 2016-07-20 DIAGNOSIS — R4182 Altered mental status, unspecified: Secondary | ICD-10-CM | POA: Diagnosis not present

## 2016-07-20 DIAGNOSIS — I350 Nonrheumatic aortic (valve) stenosis: Secondary | ICD-10-CM | POA: Diagnosis present

## 2016-07-20 DIAGNOSIS — N3001 Acute cystitis with hematuria: Secondary | ICD-10-CM | POA: Diagnosis not present

## 2016-07-20 DIAGNOSIS — G8929 Other chronic pain: Secondary | ICD-10-CM | POA: Diagnosis present

## 2016-07-20 DIAGNOSIS — M6281 Muscle weakness (generalized): Secondary | ICD-10-CM | POA: Diagnosis not present

## 2016-07-20 DIAGNOSIS — R41841 Cognitive communication deficit: Secondary | ICD-10-CM | POA: Diagnosis not present

## 2016-07-20 DIAGNOSIS — Z8546 Personal history of malignant neoplasm of prostate: Secondary | ICD-10-CM | POA: Diagnosis not present

## 2016-07-20 DIAGNOSIS — E119 Type 2 diabetes mellitus without complications: Secondary | ICD-10-CM | POA: Diagnosis not present

## 2016-07-20 LAB — BASIC METABOLIC PANEL
Anion gap: 12 (ref 5–15)
BUN: 12 mg/dL (ref 4–21)
BUN: 12 mg/dL (ref 6–20)
CALCIUM: 8.6 mg/dL — AB (ref 8.9–10.3)
CO2: 22 mmol/L (ref 22–32)
CREATININE: 0.89 mg/dL (ref 0.61–1.24)
Chloride: 102 mmol/L (ref 101–111)
Creatinine: 0.9 mg/dL (ref 0.6–1.3)
GFR calc non Af Amer: >60 mL/min (ref >60)
Glucose, Bld: 126 mg/dL — ABNORMAL HIGH (ref 65–99)
Glucose: 126 mg/dL
Potassium: 3.9 mmol/L (ref 3.4–5.3)
Potassium: 3.9 mmol/L (ref 3.5–5.1)
SODIUM: 136 mmol/L — AB (ref 137–147)
Sodium: 136 mmol/L (ref 135–145)

## 2016-07-20 LAB — GLUCOSE, CAPILLARY
GLUCOSE-CAPILLARY: 136 mg/dL — AB (ref 65–99)
Glucose-Capillary: 118 mg/dL — ABNORMAL HIGH (ref 65–99)
Glucose-Capillary: 128 mg/dL — ABNORMAL HIGH (ref 65–99)
Glucose-Capillary: 123 mg/dL — ABNORMAL HIGH (ref 65–99)

## 2016-07-20 LAB — CBC WITH DIFFERENTIAL/PLATELET
BASOS PCT: 0 %
Basophils Absolute: 0 10*3/uL (ref 0.0–0.1)
EOS ABS: 0.1 10*3/uL (ref 0.0–0.7)
EOS PCT: 1 %
HCT: 40.6 % (ref 39.0–52.0)
Hemoglobin: 13.2 g/dL (ref 13.0–17.0)
LYMPHS ABS: 1.7 10*3/uL (ref 0.7–4.0)
Lymphocytes Relative: 14 %
MCH: 31 pg (ref 26.0–34.0)
MCHC: 32.5 g/dL (ref 30.0–36.0)
MCV: 95.3 fL (ref 78.0–100.0)
MONOS PCT: 6 %
Monocytes Absolute: 0.7 10*3/uL (ref 0.1–1.0)
NEUTROS PCT: 79 %
Neutro Abs: 9.7 10*3/uL — ABNORMAL HIGH (ref 1.7–7.7)
PLATELETS: 197 10*3/uL (ref 150–400)
RBC: 4.26 MIL/uL (ref 4.22–5.81)
RDW: 16 % — ABNORMAL HIGH (ref 11.5–15.5)
WBC: 12.2 10*3/uL — AB (ref 4.0–10.5)

## 2016-07-20 LAB — PROTIME-INR
INR: 5.31 — AB
PROTHROMBIN TIME: 50.2 s — AB (ref 11.4–15.2)
PROTIME: 50.2 s — AB (ref 10.0–13.8)

## 2016-07-20 LAB — CBC AND DIFFERENTIAL
HEMATOCRIT: 41 % (ref 41–53)
HEMOGLOBIN: 13.2 g/dL — AB (ref 13.5–17.5)
PLATELETS: 197 10*3/uL (ref 150–399)
WBC: 12.2 10^3/mL

## 2016-07-20 LAB — POCT INR: INR: 5.3 — AB (ref 0.9–1.1)

## 2016-07-20 MED ORDER — TAMSULOSIN HCL 0.4 MG PO CAPS
0.4000 mg | ORAL_CAPSULE | Freq: Every day | ORAL | Status: DC
  Administered 2016-07-20 – 2016-07-27 (×8): 0.4 mg via ORAL
  Filled 2016-07-20 (×8): qty 1

## 2016-07-20 MED ORDER — TRAZODONE HCL 50 MG PO TABS
50.0000 mg | ORAL_TABLET | Freq: Every day | ORAL | Status: DC
  Administered 2016-07-20 – 2016-07-27 (×8): 50 mg via ORAL
  Filled 2016-07-20 (×8): qty 1

## 2016-07-20 MED ORDER — POLYETHYLENE GLYCOL 3350 17 G PO PACK: 17.0000 g | PACK | Freq: Every day | ORAL | Status: DC | PRN

## 2016-07-20 MED ORDER — CYCLOBENZAPRINE HCL 5 MG PO TABS: 10.0000 mg | ORAL_TABLET | Freq: Three times a day (TID) | ORAL | 0 refills | Status: DC | PRN

## 2016-07-20 MED ORDER — POTASSIUM CHLORIDE CRYS ER 10 MEQ PO TBCR
10.0000 meq | EXTENDED_RELEASE_TABLET | Freq: Every day | ORAL | Status: DC
Start: 2016-07-20 — End: 2016-07-28
  Administered 2016-07-20 – 2016-07-28 (×9): 10 meq via ORAL
  Filled 2016-07-20 (×9): qty 1

## 2016-07-20 MED ORDER — MIRABEGRON ER 25 MG PO TB24
50.0000 mg | ORAL_TABLET | Freq: Every day | ORAL | Status: DC
  Administered 2016-07-20 – 2016-07-21 (×2): 50 mg via ORAL
  Filled 2016-07-20 (×2): qty 2

## 2016-07-20 MED ORDER — ONDANSETRON HCL 4 MG/2ML IJ SOLN: 4.0000 mg | Freq: Four times a day (QID) | INTRAMUSCULAR | Status: DC | PRN

## 2016-07-20 MED ORDER — GABAPENTIN 100 MG PO CAPS
100.0000 mg | ORAL_CAPSULE | Freq: Three times a day (TID) | ORAL | Status: DC
  Administered 2016-07-20 – 2016-07-28 (×23): 100 mg via ORAL
  Filled 2016-07-20 (×24): qty 1

## 2016-07-20 MED ORDER — FUROSEMIDE 40 MG PO TABS
40.0000 mg | ORAL_TABLET | Freq: Every day | ORAL | Status: DC
  Administered 2016-07-20 – 2016-07-22 (×3): 40 mg via ORAL
  Filled 2016-07-20 (×3): qty 1

## 2016-07-20 MED ORDER — SODIUM CHLORIDE 0.9% FLUSH: 3.0000 mL | INTRAVENOUS | Status: DC | PRN

## 2016-07-20 MED ORDER — MORPHINE SULFATE (PF) 4 MG/ML IV SOLN
1.0000 mg | INTRAVENOUS | Status: DC | PRN
  Administered 2016-07-23 – 2016-07-24 (×3): 1 mg via INTRAVENOUS
  Filled 2016-07-20 (×3): qty 1

## 2016-07-20 MED ORDER — OXYCODONE HCL 5 MG PO TABS
5.0000 mg | ORAL_TABLET | ORAL | Status: DC | PRN
  Administered 2016-07-20 – 2016-07-24 (×7): 5 mg via ORAL
  Filled 2016-07-20 (×8): qty 1

## 2016-07-20 MED ORDER — PANTOPRAZOLE SODIUM 40 MG PO TBEC
40.0000 mg | DELAYED_RELEASE_TABLET | Freq: Every day | ORAL | Status: DC
  Administered 2016-07-20 – 2016-07-28 (×9): 40 mg via ORAL
  Filled 2016-07-20 (×9): qty 1

## 2016-07-20 MED ORDER — CEPHALEXIN 500 MG PO CAPS: 500.0000 mg | ORAL_CAPSULE | Freq: Four times a day (QID) | ORAL | 0 refills | Status: DC

## 2016-07-20 MED ORDER — INSULIN ASPART 100 UNIT/ML ~~LOC~~ SOLN
0.0000 [IU] | Freq: Three times a day (TID) | SUBCUTANEOUS | Status: DC
Start: 2016-07-20 — End: 2016-07-28
  Administered 2016-07-20 – 2016-07-21 (×4): 1 [IU] via SUBCUTANEOUS
  Administered 2016-07-22: 2 [IU] via SUBCUTANEOUS
  Administered 2016-07-22: 1 [IU] via SUBCUTANEOUS
  Administered 2016-07-22 – 2016-07-23 (×2): 2 [IU] via SUBCUTANEOUS
  Administered 2016-07-23 (×2): 1 [IU] via SUBCUTANEOUS
  Administered 2016-07-24 – 2016-07-26 (×6): 2 [IU] via SUBCUTANEOUS
  Administered 2016-07-26 – 2016-07-28 (×6): 1 [IU] via SUBCUTANEOUS

## 2016-07-20 MED ORDER — SENNA 8.6 MG PO TABS
2.0000 | ORAL_TABLET | Freq: Every day | ORAL | Status: DC
  Administered 2016-07-20 – 2016-07-21 (×2): 17.2 mg via ORAL
  Filled 2016-07-20 (×3): qty 2

## 2016-07-20 MED ORDER — ONDANSETRON HCL 4 MG PO TABS
4.0000 mg | ORAL_TABLET | Freq: Four times a day (QID) | ORAL | Status: DC | PRN
Start: 2016-07-20 — End: 2016-07-28

## 2016-07-20 MED ORDER — DEXTROSE 5 % IV SOLN
1.0000 g | INTRAVENOUS | Status: DC
  Administered 2016-07-20 – 2016-07-21 (×2): 1 g via INTRAVENOUS
  Filled 2016-07-20 (×3): qty 10

## 2016-07-20 MED ORDER — SODIUM CHLORIDE 0.9 % IV SOLN: 250.0000 mL | INTRAVENOUS | Status: DC | PRN

## 2016-07-20 MED ORDER — TIZANIDINE HCL 4 MG PO TABS
4.0000 mg | ORAL_TABLET | Freq: Four times a day (QID) | ORAL | Status: DC | PRN
  Administered 2016-07-22 – 2016-07-23 (×2): 4 mg via ORAL
  Filled 2016-07-20 (×2): qty 1

## 2016-07-20 MED ORDER — AMLODIPINE BESYLATE 5 MG PO TABS
2.5000 mg | ORAL_TABLET | Freq: Every day | ORAL | Status: DC
  Administered 2016-07-20 – 2016-07-22 (×3): 2.5 mg via ORAL
  Filled 2016-07-20 (×3): qty 1

## 2016-07-20 MED ORDER — FENTANYL 25 MCG/HR TD PT72: 25.0000 ug | MEDICATED_PATCH | TRANSDERMAL | Status: DC

## 2016-07-20 MED ORDER — ATORVASTATIN CALCIUM 20 MG PO TABS
20.0000 mg | ORAL_TABLET | Freq: Every day | ORAL | Status: DC
  Administered 2016-07-20 – 2016-07-27 (×7): 20 mg via ORAL
  Filled 2016-07-20 (×7): qty 1

## 2016-07-20 MED ORDER — FERROUS SULFATE 325 (65 FE) MG PO TABS
325.0000 mg | ORAL_TABLET | Freq: Two times a day (BID) | ORAL | Status: DC
  Administered 2016-07-20 – 2016-07-21 (×4): 325 mg via ORAL
  Filled 2016-07-20 (×4): qty 1

## 2016-07-20 MED ORDER — SODIUM CHLORIDE 0.9% FLUSH
3.0000 mL | Freq: Two times a day (BID) | INTRAVENOUS | Status: DC
  Administered 2016-07-20 – 2016-07-28 (×12): 3 mL via INTRAVENOUS

## 2016-07-20 MED ORDER — FINASTERIDE 5 MG PO TABS
5.0000 mg | ORAL_TABLET | Freq: Every morning | ORAL | Status: DC
  Administered 2016-07-20 – 2016-07-28 (×9): 5 mg via ORAL
  Filled 2016-07-20 (×8): qty 1

## 2016-07-20 MED ORDER — ADULT MULTIVITAMIN W/MINERALS CH
1.0000 | ORAL_TABLET | Freq: Every day | ORAL | Status: DC
  Administered 2016-07-20 – 2016-07-28 (×8): 1 via ORAL
  Filled 2016-07-20 (×8): qty 1

## 2016-07-20 MED ORDER — ALBUTEROL SULFATE (2.5 MG/3ML) 0.083% IN NEBU: 2.5000 mg | INHALATION_SOLUTION | RESPIRATORY_TRACT | Status: DC | PRN

## 2016-07-20 MED ORDER — ALLOPURINOL 300 MG PO TABS
300.0000 mg | ORAL_TABLET | Freq: Every day | ORAL | Status: DC
  Administered 2016-07-20 – 2016-07-28 (×9): 300 mg via ORAL
  Filled 2016-07-20 (×9): qty 1

## 2016-07-20 MED ORDER — CALCIUM CARBONATE-VITAMIN D 500-200 MG-UNIT PO TABS
1.0000 | ORAL_TABLET | Freq: Every day | ORAL | Status: DC
  Administered 2016-07-20 – 2016-07-27 (×8): 1 via ORAL
  Filled 2016-07-20 (×8): qty 1

## 2016-07-20 MED ORDER — ACETAMINOPHEN 500 MG PO TABS
1000.0000 mg | ORAL_TABLET | Freq: Three times a day (TID) | ORAL | Status: DC
  Administered 2016-07-20 – 2016-07-28 (×21): 1000 mg via ORAL
  Filled 2016-07-20 (×22): qty 2

## 2016-07-20 NOTE — Evaluation (Signed)
Physical Therapy Evaluation Patient Details Name: Eric Rivers MRN: [id number] DOB: 1930-12-28 Today's Date: 07/20/2016   History of Present Illness  Eric Rivers is a 83 y.o. male with medical history significant of DM-2, HTN, Afib on coumadin, recently diagnosed with L2 compression fracture-presented to the ED for evaluation of intractable back pain. He recently was seen in the ED on and 5/10 and 5/14 for similar problems. He had a mechanical fall earlier this month, with gradual worsening of his low back pain. A MRI LS Spine on 5/14 confirmed a L2 compression fracture, he was started on oral narcotics and other supportive measures, but continued to have pain. Pain is mostly in the lower back, 10/10 at its worst. Oxycodone only provides some relief, pain is aggravated by moving. No associated fever, urinary or fecal incontinence. Pain has now limited his ambulation. He presented back to the ED on 5/19-continued to have pain inspite of narcotics, benzo's, and was also found to have a UTI and coagulopath  Clinical Impression  Pt admitted with above diagnosis. Pt currently with functional limitations due to the deficits listed below (see PT Problem List).  Pt will benefit from skilled PT to increase their independence and safety with mobility to allow discharge to the venue listed below.   Pt  Is in urine soaked gown/bed pads on arrival, reports he has urgency; pt does not verbalize that the bed  and his gown are wet; he seems to possibly  have some STM deficits, asks same questions repeatedly during PT eval;  Pt friend in room  reports that pt has a room in AL at [facility], feel this would be a good plan for pt, recommend HHPT at ALF vs SNF depending on progress and if ALF still available at time of pt D/C; will continue to follow     Follow Up Recommendations Home health PT;SNF (HHPT at ALF vs SNF depending on progress)    Equipment Recommendations  Rolling walker with 5" wheels     Recommendations for Other Services       Precautions / Restrictions Precautions Precautions: Fall Precaution Comments: has TLSO with him but states he has not been wearing it and only put it on prior to coming to the ED (per pt he got brace on last ED visit)      Mobility  Bed Mobility Overal bed mobility: Needs Assistance Bed Mobility: Rolling;Sidelying to Sit;Sit to Sidelying Rolling: Min assist Sidelying to sit: Min assist     Sit to sidelying: Mod assist General bed mobility comments: multi-modal cues for log rollling and self assist; light assist and incr time to bring trunk to upright, assist to brign LEs back onto bed  Transfers Overall transfer level: Needs assistance Equipment used: Rolling walker (2 wheeled) Transfers: Sit to/from Stand Sit to Stand: Min assist         General transfer comment: assist to rise and stabilize, cues for safety and hand placement; pt decline donning TLSO  Ambulation/Gait             General Gait Details: deferred d/t elevated INR of 5.31  Stairs            Wheelchair Mobility    Modified Rankin (Stroke Patients Only)       Balance Overall balance assessment: Needs assistance;History of Falls Sitting-balance support: No upper extremity supported;Feet supported Sitting balance-Leahy Scale: Fair     Standing balance support: Bilateral upper extremity supported Standing balance-Leahy Scale: Poor Standing balance comment:  reliant on UEs                              Pertinent Vitals/Pain Pain Assessment: Faces Faces Pain Scale: Hurts even more Pain Location: back with movement Pain Descriptors / Indicators: Discomfort;Grimacing Pain Intervention(s): Monitored during session;Repositioned    Home Living Family/patient expects to be discharged to:: Private residence Living Arrangements: Alone   Type of Home: House Home Access: Ramped entrance     Home Layout: One level Home Equipment: [place] -  single point Additional Comments: friend does grocery shopping, drives pt to [place]; per pt friend's report the pt has reluctantly agreed to go to ALF at [facility] and they do have a room available    Prior Function Level of Independence: Independent    recent fall; pt reports he uses a cane at times but is reluctant to use the RW (per pt friend he needs to use the walker)     Comments: pt has housekeeper for heavier cleaning once every 6 wks     Hand Dominance        Extremity/Trunk Assessment   Upper Extremity Assessment Upper Extremity Assessment: Generalized weakness    Lower Extremity Assessment Lower Extremity Assessment: Generalized weakness    Cervical / Trunk Assessment Cervical / Trunk Assessment: Kyphotic  Communication   Communication: No difficulties  Cognition Arousal/Alertness: Awake/alert Behavior During Therapy: WFL for tasks assessed/performed Overall Cognitive Status: Within Functional Limits for tasks assessed                                 General Comments: pt repeats same questions a few times during PT eval--possible short term memory deficits       General Comments      Exercises     Assessment/Plan    PT Assessment Patient needs continued PT services  PT Problem List Decreased strength;Decreased activity tolerance;Decreased balance;Decreased knowledge of use of DME;Decreased mobility       PT Treatment Interventions DME instruction;Gait training;Functional mobility training;Therapeutic activities;Therapeutic exercise;Patient/family education    PT Goals (Current goals can be found in the Care Plan section)  Acute Rehab PT Goals Patient Stated Goal: none stated PT Goal Formulation: With patient Time For Goal Achievement: 07/27/16 Potential to Achieve Goals: Good    Frequency Min 3X/week   Barriers to discharge        Co-evaluation               AM-PAC PT "6 Clicks" Daily Activity  Outcome Measure  Difficulty turning over in bed (including adjusting bedclothes, sheets and blankets)?: A Lot Difficulty moving from lying on back to sitting on the side of the bed? : A Little Difficulty sitting down on and standing up from a chair with arms (e.g., wheelchair, bedside commode, etc,.)?: A Little Help needed moving to and from a bed to chair (including a wheelchair)?: A Little Help needed walking in hospital room?: A Little Help needed climbing 3-5 steps with a railing? : A Lot 6 Click Score: 16    End of Session Equipment Utilized During Treatment: Gait belt Activity Tolerance: Patient tolerated treatment well;Other (comment) (eval limited d/t pt INR elevated) Patient left: in bed;with call bell/phone within reach;with bed alarm set;with family/visitor present   PT Visit Diagnosis: Unsteadiness on feet (R26.81);Muscle weakness (generalized) (M62.81)    Time: 4401-0272 PT Time Calculation (min) (ACUTE  ONLY): 19 min   Charges:   PT Evaluation $PT Eval Moderate Complexity: 1 Procedure     PT G Codes:   PT G-Codes **NOT FOR INPATIENT CLASS** Functional Assessment Tool Used: AM-PAC 6 Clicks Basic Mobility;Clinical judgement Functional Limitation: Mobility: Walking and moving around Mobility: Walking and Moving Around Current Status (J0093): At least 20 percent but less than 40 percent impaired, limited or restricted Mobility: Walking and Moving Around Goal Status [phone]): At least 1 percent but less than 20 percent impaired, limited or restricted    Kenyon Ana, PT Pager: [phone] 07/20/2016   [facility] 07/20/2016, 3:39 PM

## 2016-07-20 NOTE — ED Provider Notes (Signed)
Pt slept all night He is now arousable, but still falls asleep during speaking to me He can move all extremities His abdomen is soft/nontender He reports he has some continued back pain He is high risk for falls He lives alone He is coagulopathic from coumadin He also has UTI Will admit patient D/w nikki carter for admission BP 96/65   Pulse 68   Temp 97.8 F (36.6 C) (Oral)   Resp 18   Wt 210 lb (95.3 kg)   SpO2 98%   BMI 31.93 kg/m     Ripley Fraise, MD 07/20/16 [phone]

## 2016-07-20 NOTE — H&P (Signed)
HISTORY AND PHYSICAL       PATIENT DETAILS Name: Eric Rivers Age: 83 y.o. Sex: male Date of Birth: 20-Oct-1930 Admit Date: 07/19/2016 ENM:MHWK, Hunt Oris, MD   Patient coming from: Home   CHIEF COMPLAINT:  Intractable Low Back Pain  HPI: Eric Rivers is a 83 y.o. male with medical history significant of DM-2, HTN, Afib on coumadin, recently diagnosed with L2 compression fracture-presented to the ED for evaluation of intractable back pain. He recently was seen in the ED on and 5/10 and 5/14 for similar problems. He had a mechanical fall earlier this month, with gradual worsening of his low back pain. A MRI LS Spine on 5/14 confirmed a L2 compression fracture, he was started on oral narcotics and other supportive measures, but continued to have pain. Pain is mostly in the lower back, 10/10 at its worst. Oxycodone only provides some relief, pain is aggravated by moving. No associated fever, urinary or fecal incontinence. Pain has now limited his ambulation. He presented back to the ED on 5/19-continued to have pain inspite of narcotics, benzo's, and was also found to have a UTI and coagulopathy. Hence the hospitalist service was asked to admit this patient for further evaluation and treatment.   Per patient, he recently completed a course of Antibiotic a week ago for UTI, he however continues to have dysuria and frequency of urination. He denies and hematuria, fever or flank pain   ED Course:  See above  Note: Lives at: Home Mobility:  Independent Chronic Indwelling Foley:no   REVIEW OF SYSTEMS:  Constitutional:   No  weight loss, night sweats,  Fevers, chills, fatigue.  HEENT:    No headaches, Dysphagia,Tooth/dental problems,Sore throat,  No sneezing, itching, ear ache, nasal congestion, post nasal drip  Cardio-vascular: No chest pain,Orthopnea, PND,lower extremity edema, anasarca, palpitations  GI:  No heartburn, indigestion, abdominal pain, nausea,  vomiting, diarrhea, melena or hematochezia  Resp: No shortness of breath, cough, hemoptysis,plueritic chest pain.   Skin:  No rash or lesions.  GU:  No  flank pain.  Musculoskeletal: No joint pain or swelling.   Endocrine: No heat intolerance, no cold intolerance, no polyuria, no polydipsia  Psych: No change in mood or affect. No depression or anxiety.  No memory loss.   ALLERGIES:   Allergies  Allergen Reactions  . Metoprolol Other (See Comments)    Reaction:  Bradycardia with 55m BID dose    PAST MEDICAL HISTORY: Past Medical History:  Diagnosis Date  . ACE-inhibitor cough   . Aortic dissection ([facility]    Surgical repair February, 2014  . Aortic stenosis    mild....echo... september... 2010/ mild... echo..[name] KitchenMarland [place], 2011  . Atrial fibrillation (HCC)     Chronic,   24 hour holter, September, 2011.... atrial fib rate is controlled.... there is some bradycardia but  no marked pauses  . Bladder cancer ([facility]    recurrence with TUR-B March '09  . DDD (degenerative disc disease)    lumbar spine  . Diabetes mellitus    type 2  . Diabetes mellitus with neuropathy [place] 12/04/2006   Qualifier: Diagnosis of  By: NLinda HedgesMD, MHeinz Knuckles medications - DPP4   . Diverticulosis of colon with hemorrhage 01/01/2014  . Ejection fraction    EF 60%, echo, 01/2010  . Fluid overload   . Fracture of metacarpal bone 11/30/11  . GERD (gastroesophageal reflux disease)   . Gout   . History of echocardiogram  Echo 5/17 - mild LVH, EF 55-60%, mod AS (mean 16 mmHg, peak 29 mmHg), MAC, midl MR, massive BAE, mod dilated RV, low normal RVSF, mod TR, PASP 64 mmHg  . Hx of colonoscopy    approx. 10 years  . Hypercholesterolemia   . Hyperlipidemia   . Hypertension   . Mitral regurgitation   . Normal nuclear stress test    06/2005 , also ABI normal 2007  . Prostate cancer ([place])    radiation tx.  . Pulmonary hypertension ([place])    67mHg, echo, 01/2010  . Shortness of breath    on exertion  .  Warfarin anticoagulation    Atrial fib  . Whooping cough     PAST SURGICAL HISTORY: Past Surgical History:  Procedure Laterality Date  . ASCENDING AORTIC ROOT REPLACEMENT N/A 04/15/2012   Procedure: ASCENDING AORTIC ROOT REPLACEMENT;  Surgeon: BGaye Pollack MD;  [place]  Service: Open Heart Surgery;  Laterality: N/A;  . bladder cancer biopsy  10/16/2010   negative for malignancy  . bladder microscopic  04/13/2007   high grade papillary urothelial lesions  . COLONOSCOPY N/A 01/01/2014   Procedure: COLONOSCOPY;  Surgeon: CGatha Mayer MD;  [facility];  Service: Endoscopy;  Laterality: N/A;  . CYSTOSCOPY/RETROGRADE/URETEROSCOPY Bilateral 04/04/2013   Procedure: CYSTOSCOPY WITH RETROGRADE PYELOGRAM AND BLADDER BIOPSY;  Surgeon: DMolli Hazard MD;  Location: [facility];  Service: Urology;  Laterality: Bilateral;  . 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 EXPLORATION POST OPERATIVE OPEN HEART  04/2012  . Herniatic disc surgery    . LUMBAR LAMINECTOMY     '02  . PROSTATE BIOPSY  09/25/2011   GLEASON 3+3=6 AND 4+3=7  . TEE WITHOUT CARDIOVERSION N/A 04/15/2012   Procedure: TRANSESOPHAGEAL ECHOCARDIOGRAM (TEE);  Surgeon: BGaye Pollack MD;  [place]  Service: Open Heart Surgery;  Laterality: N/A;  . TRANSURETHRAL RESECTION OF BLADDER TUMOR N/A 06/13/2015   Procedure: TRANSURETHRAL RESECTION OF PROSTATE (TURP) CLOT EVACUATION AND FULGERATION, BLADDER STONE REMOVAL ;  Surgeon: TAlexis Frock MD;  Location[facility];  Service: Urology;  Laterality: N/A;  . TUR-BT '06, '09      MEDICATIONS AT HOME: Prior to Admission medications   Medication Sig Start Date End Date Taking? Authorizing Provider  acetaminophen (TYLENOL) 500 MG tablet Take 1,000 mg by mouth every 6 (six) hours as needed for mild pain, moderate pain, fever or headache.     [provider]  allopurinol (ZYLOPRIM) 300 MG tablet take 1 tablet by mouth once daily  02/12/16   JBiagio Borg MD  amLODipine (NORVASC) 2.5 MG tablet take 1 tablet by mouth once daily 07/16/16   NDorothy Spark MD  aspirin EC 325 MG tablet Take 325 mg by mouth every morning.     [provider]  atorvastatin (LIPITOR) 20 MG tablet take 1 tablet by mouth once daily 02/12/16   JBiagio Borg MD  Calcium Carbonate-Vitamin D (CALCIUM 600+D) 600-400 MG-UNIT tablet Take 1 tablet by mouth at bedtime.    [provider]  cephALEXin (KEFLEX) 500 MG capsule Take 1 capsule (500 mg total) by mouth 4 (four) times daily. 07/20/16   HIsla Pence MD  cyclobenzaprine (FLEXERIL) 5 MG tablet Take 2 tablets (10 mg total) by mouth 3 (three) times daily as needed for muscle spasms. 07/20/16   HIsla Pence MD  desonide (DESOWEN) 0.05 % lotion Apply 1 application topically daily as needed (rosacea).  [provider]  docusate sodium (COLACE) 100 MG capsule Take 1 capsule (100 mg total) by mouth every 12 (twelve) hours. 07/10/16   Mesner, [name], MD  fentaNYL (DURAGESIC) 25 MCG/HR patch Place 1 patch (25 mcg total) onto the skin every 3 (three) days. 07/15/16   Biagio Borg, MD  ferrous sulfate 325 (65 FE) MG tablet Take 650 mg by mouth daily with breakfast.    [provider]  finasteride (PROSCAR) 5 MG tablet Take 1 tablet (5 mg total) by mouth every morning. 12/23/13   Biagio Borg, MD  furosemide (LASIX) 40 MG tablet Take 1 tablet (40 mg total) by mouth daily. 11/12/15   Imogene Burn, PA-C  gabapentin (NEURONTIN) 100 MG capsule Take 1 capsule (100 mg total) by mouth 3 (three) times daily. 07/15/16   Biagio Borg, MD  JANUVIA 100 MG tablet take 1 tablet by mouth once daily 06/23/16   Biagio Borg, MD  ketoconazole (NIZORAL) 2 % shampoo Apply 1 application topically 4 (four) times a week.    [provider]  mirabegron ER (MYRBETRIQ) 50 MG TB24 tablet Take 50 mg by mouth daily.    [provider]  Multiple Vitamins-Minerals (ONCOVITE PO)  Take 1 tablet by mouth daily.     [provider]  oxyCODONE (ROXICODONE) 5 MG immediate release tablet Take 1 tablet (5 mg total) by mouth every 4 (four) hours as needed for severe pain. 07/15/16   Biagio Borg, MD  pantoprazole (PROTONIX) 40 MG tablet take 1 tablet by mouth once daily 02/12/16   Biagio Borg, MD  potassium chloride (K-DUR,KLOR-CON) 10 MEQ tablet take 1 tablet by mouth once daily 07/02/16   Dorothy Spark, MD  senna (SENOKOT) 8.6 MG tablet Take 1 tablet by mouth at bedtime.     [provider]  Tamsulosin HCl (FLOMAX) 0.4 MG CAPS Take 0.4 mg by mouth at bedtime.     [provider]  tiZANidine (ZANAFLEX) 4 MG tablet Take 1 tablet (4 mg total) by mouth every 6 (six) hours as needed for muscle spasms. 07/04/16   Biagio Borg, MD  traMADol (ULTRAM) 50 MG tablet Take 1 tablet (50 mg total) by mouth every 6 (six) hours as needed. Patient taking differently: Take 50 mg by mouth every 6 (six) hours as needed for moderate pain.  07/04/16   Biagio Borg, MD  traZODone (DESYREL) 50 MG tablet Take 1 tablet (50 mg total) by mouth See admin instructions. Patient taking differently: Take 50 mg by mouth at bedtime.  01/30/16   Biagio Borg, MD  warfarin (COUMADIN) 4 MG tablet Take 4-6 mg by mouth daily. Pt takes one and one-half tablet on Thursday and one tablet all other days.    [provider]    FAMILY HISTORY: Family History  Problem Relation Age of Onset  . Prostate cancer Father 38       passed with prostate ca  . Cancer Father   . Stroke Father   . Heart disease Mother   . Hypertension Mother   . Heart attack Mother    SOCIAL HISTORY:  reports that he quit smoking about 41 years ago. His smoking use included Cigarettes. He smoked 1.00 pack per day. He has never used smokeless tobacco. He reports that he does not drink alcohol or use drugs.  PHYSICAL EXAM: Blood pressure 113/65, pulse 70, temperature 97.8 F (36.6 C), temperature source  Oral, resp. rate 18, weight  95.3 kg (210 lb), SpO2 98 %.  General appearance :Awake, alert, not in any distress.  Eyes:, pupils equally reactive to light and accomodation,no scleral icterus. HEENT: Atraumatic and Normocephalic Neck: supple, no JVD. No cervical lymphadenopathy.  Resp:Good air entry bilaterally, no added sounds  CVS: S1 S2 regular, no murmurs.  GI: Bowel sounds present, Non tender and not distended with no gaurding, rigidity or rebound.No organomegaly Extremities: B/L Lower Ext shows no edema, both legs are warm to touch Neurology:  speech clear,pain limits exam-but able to lift both legs against mild resistance. Sensation is grossly intact. Psychiatric: Normal judgment and insight. Alert and oriented x 3. Normal mood. Musculoskeletal:No digital cyanosis Skin:No Rash, warm and dry Wounds:N/A  LABS ON ADMISSION:  I have personally reviewed following labs and imaging studies  CBC:  Recent Labs Lab 07/14/16 0201 07/20/16 0505  WBC 17.6* 12.2*  NEUTROABS  --  9.7*  HGB 13.6 13.2  HCT 41.2 40.6  MCV 94.1 95.3  PLT 249 324    Basic Metabolic Panel:  Recent Labs Lab 07/14/16 0201 07/20/16 0505  NA 136 136  K 3.8 3.9  CL 99* 102  CO2 23 22  GLUCOSE 146* 126*  BUN 21* 12  CREATININE 0.84 0.89  CALCIUM 9.4 8.6*    GFR: Estimated Creatinine Clearance: 69.2 mL/min (by C-G formula based on SCr of 0.89 mg/dL).  Liver Function Tests: No results for input(s): AST, ALT, ALKPHOS, BILITOT, PROT, ALBUMIN in the last 168 hours. No results for input(s): LIPASE, AMYLASE in the last 168 hours. No results for input(s): AMMONIA in the last 168 hours.  Coagulation Profile:  Recent Labs Lab 07/20/16 0505  INR 5.31*    Cardiac Enzymes: No results for input(s): CKTOTAL, CKMB, CKMBINDEX, TROPONINI in the last 168 hours.  BNP (last 3 results)  Recent Labs  09/07/15 1107  PROBNP 566.0*    HbA1C: No results for input(s): HGBA1C in the last 72  hours.  CBG: No results for input(s): GLUCAP in the last 168 hours.  Lipid Profile: No results for input(s): CHOL, HDL, LDLCALC, TRIG, CHOLHDL, LDLDIRECT in the last 72 hours.  Thyroid Function Tests: No results for input(s): TSH, T4TOTAL, FREET4, T3FREE, THYROIDAB in the last 72 hours.  Anemia Panel: No results for input(s): VITAMINB12, FOLATE, FERRITIN, TIBC, IRON, RETICCTPCT in the last 72 hours.  Urine analysis:    Component Value Date/Time   COLORURINE YELLOW 07/19/2016 2024   APPEARANCEUR HAZY (A) 07/19/2016 2024   LABSPEC 1.014 07/19/2016 2024   PHURINE 7.0 07/19/2016 2024   GLUCOSEU NEGATIVE 07/19/2016 2024   GLUCOSEU NEGATIVE 07/15/2016 1152   HGBUR LARGE (A) 07/19/2016 2024   BILIRUBINUR NEGATIVE 07/19/2016 2024   BILIRUBINUR negative 07/07/2016 1732   KETONESUR 80 (A) 07/19/2016 2024   PROTEINUR 30 (A) 07/19/2016 2024   UROBILINOGEN 1.0 07/15/2016 1152   NITRITE NEGATIVE 07/19/2016 2024   LEUKOCYTESUR LARGE (A) 07/19/2016 2024    Sepsis Labs: Lactic Acid, Venous    Component Value Date/Time   LATICACIDVEN 1.62 12/28/2013 2228     Microbiology: Recent Results (from the past 240 hour(s))  Urine culture     Status: None   Collection Time: 07/15/16 11:52 AM  Result Value Ref Range Status   Organism ID, Bacteria NO GROWTH  Final      RADIOLOGIC STUDIES ON ADMISSION: Dg Lumbar Spine Complete  Result Date: 07/19/2016 CLINICAL DATA:  Severe low back pain chronically. EXAM: LUMBAR SPINE - COMPLETE 4+ VIEW COMPARISON:  CT 07/10/2016 and 06/11/2015  FINDINGS: Diffuse osteopenia. Mild spondylosis throughout the lumbar spine. Subtle grade 1 anterolisthesis of L4 on L5 likely due to the moderate facet arthropathy. Disc space narrowing at the L4-5 level. Moderate L2 compression fracture unchanged from recent CT and new compared to 06/11/2015. Calcified plaque over the abdominal aorta. IMPRESSION: Moderate L2 compression fracture age indeterminate, unchanged from the  recent CT and new since 06/11/2015. Mild spondylosis of the lumbar spine with disc disease at the L4-5 level. Grade 1 anterolisthesis of L4 on L5 due to the moderate facet arthropathy. Electronically Signed   By: Marin Olp M.D.   On: 07/19/2016 21:04   Ct Renal Stone Study  Result Date: 07/19/2016 CLINICAL DATA:  Flank pain, possible UTI EXAM: CT ABDOMEN AND PELVIS WITHOUT CONTRAST TECHNIQUE: Multidetector CT imaging of the abdomen and pelvis was performed following the standard protocol without IV contrast. COMPARISON:  07/10/2016 FINDINGS: Lower chest: Small right pleural effusion. Hepatobiliary: Liver is grossly unremarkable. Layering small gallstones (series 2/ image 74). No associated inflammatory changes. Pancreas: Within normal limits. Spleen: Within normal limits. Adrenals/Urinary Tract: Adrenal glands are within normal limits. Kidneys are within normal limits. No renal or ureteral calculi. No hydronephrosis. Bladder is underdistended. Stable high density material/ calcification along the posterior aspect of the bladder (series 2/ image 66) extending into a prostate TURP defect (series 2/image 71). Stomach/Bowel: Stomach is within normal limits. No evidence of bowel obstruction. Normal appendix (series 2/ image 54). Vascular/Lymphatic: Known thoracoabdominal aortic dissection is poorly evaluated on unenhanced CT, although a displaced intimal calcification is present. 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 Atherosclerotic calcifications of the abdominal aorta and branch vessels. No suspicious abdominopelvic lymphadenopathy. Reproductive: Brachytherapy seeds in the prostate. Postsurgical changes related to TURP defect with calcifications, as described above. Other: No abdominopelvic ascites. Tiny fat containing periumbilical hernia. Musculoskeletal: Moderate compression fracture deformity at L2 (sagittal image 108), with 50% loss of height,  mildly progressed from recent CT. Minimal retropulsion of 1-2 mm. IMPRESSION: Moderate compression fracture deformity at L2, with 50% loss of height, mildly progressed from recent CT. Minimal retropulsion of 1-2 mm. Postsurgical changes related to TURP. Dystrophic calcification along the posterior bladder and extending into the TURP defect. Cholelithiasis, without associated inflammatory changes. Small right pleural effusion. 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 No findings suspicious for metastatic disease. Electronically Signed   By: Julian Hy M.D.   On: 07/19/2016 22:31    I have personally reviewed images of MRI LS Spine   EKG:   ASSESSMENT AND PLAN: Intractable low back pain:likely 2/2 L2 compression fracture. I do not think back pain is due to pyelonephritis although he has UTI which is probably just cystitis. Will admit to medical unit-continue oxycodone and muscle relaxants as needed, apply TLSO brace use morphine IV for intractable pain. PT evaluation will be ordered, case was discussed with Dr. Rosalee Kaufman on-call-who suggested that we continue with supportive measures, and if he continues to have pain consult interventional radiology for possible kyphoplasty.   UTI: Continue Rocephin, await cultures.  Coagulopathy: On warfarin-recently completed a antimicrobial course-INR likely elevated due to drug interaction. Hold Coumadin for now repeat INR tomorrow.  Chronic Atrial fibrillation: See above regarding anticoagulation-currently rate controlled without the use of any antihypertensives.  Type 2 diabetes: Hold oral agents-place on SSI and follow CBGs.  Hypertension: Continue amlodipine-and follow BP trend  BPH: Continue Flomax  Dyslipidemia: Continue statin  Gout: Continue allopurinol-no evidence of flare at this time.  Further  plan will depend as patient's clinical course evolves and further radiologic and laboratory data become available. Patient will be monitored  closely.  Above noted plan was discussed with patient face to face at bedside, he was in agreement.   CONSULTS: None  DVT Prophylaxis: SCD's  Code Status: Full Code  Disposition Plan:  Discharge back home  in 1-2 days, depending on clinical course  Admission status:Observation going to medical floor  Total time spent  55 minutes.Greater than 50% of this time was spent in counseling, explanation of diagnosis, planning of further management, and coordination of care.  Oren Binet [facility] Pager [phone]  If 7PM-7AM, please contact night-coverage [email or web address] Password [email or web address] 07/20/2016, 7:45 AM

## 2016-07-20 NOTE — ED Notes (Signed)
Dr Christy Gentles and Cheral Bay RN informed pt INR 5.31.

## 2016-07-20 NOTE — ED Notes (Signed)
Changed pt's wet brief and chuck

## 2016-07-20 NOTE — ED Notes (Signed)
Bed: WA23 Expected date:  Expected time:  Means of arrival:  Comments: Room 2 

## 2016-07-21 DIAGNOSIS — E119 Type 2 diabetes mellitus without complications: Secondary | ICD-10-CM

## 2016-07-21 DIAGNOSIS — R791 Abnormal coagulation profile: Secondary | ICD-10-CM

## 2016-07-21 DIAGNOSIS — R319 Hematuria, unspecified: Secondary | ICD-10-CM

## 2016-07-21 LAB — BASIC METABOLIC PANEL
Anion gap: 11 (ref 5–15)
BUN: 13 mg/dL (ref 4–21)
BUN: 13 mg/dL (ref 6–20)
CHLORIDE: 99 mmol/L — AB (ref 101–111)
CO2: 29 mmol/L (ref 22–32)
Calcium: 9.1 mg/dL (ref 8.9–10.3)
Creatinine, Ser: 0.96 mg/dL (ref 0.61–1.24)
Creatinine: 1 mg/dL (ref 0.6–1.3)
GFR calc non Af Amer: >60 mL/min (ref >60)
GLUCOSE: 114 mg/dL
Glucose, Bld: 114 mg/dL — ABNORMAL HIGH (ref 65–99)
POTASSIUM: 3.4 mmol/L — AB (ref 3.5–5.1)
Potassium: 3.4 mmol/L (ref 3.4–5.3)
SODIUM: 139 mmol/L (ref 137–147)
SODIUM: 139 mmol/L (ref 135–145)

## 2016-07-21 LAB — CBC
HEMATOCRIT: 41.7 % (ref 39.0–52.0)
HEMOGLOBIN: 13.6 g/dL (ref 13.0–17.0)
MCH: 31.5 pg (ref 26.0–34.0)
MCHC: 32.6 g/dL (ref 30.0–36.0)
MCV: 96.5 fL (ref 78.0–100.0)
Platelets: 196 10*3/uL (ref 150–400)
RBC: 4.32 MIL/uL (ref 4.22–5.81)
RDW: 15.9 % — ABNORMAL HIGH (ref 11.5–15.5)
WBC: 10.4 10*3/uL (ref 4.0–10.5)

## 2016-07-21 LAB — GLUCOSE, CAPILLARY
GLUCOSE-CAPILLARY: 127 mg/dL — AB (ref 65–99)
GLUCOSE-CAPILLARY: 138 mg/dL — AB (ref 65–99)
Glucose-Capillary: 131 mg/dL — ABNORMAL HIGH (ref 65–99)

## 2016-07-21 LAB — PROTIME-INR
INR: 4.93
PROTHROMBIN TIME: 47.3 s — AB (ref 11.4–15.2)
Protime: 47.3 seconds — AB (ref 10.0–13.8)

## 2016-07-21 LAB — POCT INR: INR: 4.9 — AB (ref 0.9–1.1)

## 2016-07-21 LAB — CBC AND DIFFERENTIAL
HCT: 42 % (ref 41–53)
Hemoglobin: 13.6 g/dL (ref 13.5–17.5)
Platelets: 196 10*3/uL (ref 150–399)
WBC: 10.4 10*3/mL

## 2016-07-21 MED ORDER — POLYETHYLENE GLYCOL 3350 17 G PO PACK
17.0000 g | PACK | Freq: Every day | ORAL | Status: DC
  Administered 2016-07-21 – 2016-07-28 (×6): 17 g via ORAL
  Filled 2016-07-21 (×7): qty 1

## 2016-07-21 MED ORDER — FERROUS SULFATE 325 (65 FE) MG PO TABS
325.0000 mg | ORAL_TABLET | Freq: Every day | ORAL | Status: DC
  Administered 2016-07-22 – 2016-07-28 (×7): 325 mg via ORAL
  Filled 2016-07-21 (×7): qty 1

## 2016-07-21 MED ORDER — VITAMIN K1 10 MG/ML IJ SOLN
10.0000 mg | Freq: Once | INTRAVENOUS | Status: AC
  Administered 2016-07-21: 10 mg via INTRAVENOUS
  Filled 2016-07-21: qty 1

## 2016-07-21 NOTE — Progress Notes (Signed)
ANTICOAGULATION CONSULT NOTE - Initial Consult  Pharmacy Consult for Heparin Indication: atrial fibrillation  Allergies  Allergen Reactions  . Metoprolol Other (See Comments)    Reaction:  Bradycardia with 81m BID dose    Patient Measurements: Height: _0  (172.7 cm) Weight: 196 lb 13.9 oz (89.3 kg) IBW/kg (Calculated) : 68.4 Heparin Dosing Weight: 87 kg  Vital Signs: Temp: 97.8 F (36.6 C) (05/21 1400) Temp Source: Oral (05/21 1400) BP: 115/72 (05/21 1400) Pulse Rate: 64 (05/21 1400)  Labs:  Recent Labs  07/20/16 0505 07/21/16 0535  HGB 13.2 13.6  HCT 40.6 41.7  PLT 197 196  LABPROT 50.2* 47.3*  INR 5.31* 4.93*  CREATININE 0.89 0.96    Estimated Creatinine Clearance: 62.2 mL/min (by C-G formula based on SCr of 0.96 mg/dL).   Medical History: Past Medical History:  Diagnosis Date  . ACE-inhibitor cough   . Aortic dissection ([facility]    Surgical repair February, 2014  . Aortic stenosis    mild....echo... september... 2010/ mild... echo..[name] KitchenMarland [place], 2011  . Atrial fibrillation (HCC)     Chronic,   24 hour holter, September, 2011.... atrial fib rate is controlled.... there is some bradycardia but  no marked pauses  . Bladder cancer ([facility]    recurrence with TUR-B March '09  . DDD (degenerative disc disease)    lumbar spine  . Diabetes mellitus    type 2  . [place] with [place] ([place] 12/04/2006   Qualifier: Diagnosis of  By: NLinda HedgesMD, MHeinz Knuckles medications - DPP4   . Diverticulosis of colon with hemorrhage 01/01/2014  . Ejection fraction    EF 60%, echo, 01/2010  . Fluid overload   . Fracture of metacarpal bone 11/30/11  . GERD (gastroesophageal reflux disease)   . Gout   . History of echocardiogram    Echo 5/17 - mild LVH, EF 55-60%, mod AS (mean 16 mmHg, peak 29 mmHg), MAC, midl MR, massive BAE, mod dilated RV, low normal RVSF, mod TR, PASP 64 mmHg  . Hx of colonoscopy    approx. 10 years  . Hypercholesterolemia   . Hyperlipidemia   .  Hypertension   . Mitral regurgitation   . Normal nuclear stress test    06/2005 , also ABI normal 2007  . [place]    radiation tx.  . Pulmonary hypertension ([place]    436mg, echo, 01/2010  . Shortness of breath    on exertion  . Warfarin anticoagulation    Atrial fib  . Whooping cough      Assessment:  [id number] male admitted on 5/20 with low back pain (xray shows L2 compression fracture).  PMH significant for AFib and patient on warfarin PTA and UTI (recently completed course of antibiotics).  Admitting INR = 5.31 with warfarin regimen of 1m59maily except 6mg22m Thursdays (last dose taken 5/19)  INR (5/21) = 4.93;  Vitamin K 10mg86mx 1 given today  Plan for kyphoplasty once INR < 1.6  IV heparin per pharmacy to begin when INR < 2  Goal of Therapy:  Heparin level 0.3-0.7 units/ml Monitor platelets by anticoagulation protocol: Yes   Plan:   Check daily PT/INR  Begin IV heparin when INR < 2  Hyland Mollenkopf, LeannToribio HarbourrmD 07/21/2016,5:35 PM

## 2016-07-21 NOTE — Care Management Note (Signed)
Case Management Note  Patient Details  Name: Eric Rivers MRN: [id number] Date of Birth: Jul 27, 1930  Subjective/Objective: 83 y/o m admitted w/UTI. From home. Per patient has a bed @ [facility] tomorrow-CSW notified to confirm @ assist w/transition. PT-recc HHPT([facility] has their won), & rw-[facility] to deliver rw to rm prior d/c.                   Action/Plan:d/c home w/HHC/dme   Expected Discharge Date:                  Expected Discharge Plan:  [place]  In-House Referral:  Clinical Social Work  Discharge planning Services  CM Consult  Post Acute Care Choice:    Choice offered to:  Patient  DME Arranged:    DME Agency:     HH Arranged:    [place] Agency:  Other - See comment ([facility]-has own HHPT)  Status of Service:  In process, will continue to follow  If discussed at Long Length of Stay Meetings, dates discussed:    Additional Comments:  Dessa Phi, RN 07/21/2016, 12:37 PM

## 2016-07-21 NOTE — Progress Notes (Addendum)
PROGRESS NOTE  Eric Rivers QAS:[id number] DOB: [birth year] DOA: 07/19/2016 PCP: Biagio Borg, MD  HPI/Recap of past 24 hours:  laying in bed , report pain is tolerable when laying flat, denies urinary symptom, no fever, no abdominal pain  Assessment/Plan: Principal Problem:   UTI (urinary tract infection) Active Problems:   Diabetes mellitus with neuropathy ([place])   Chronic atrial fibrillation (HCC)   Hypertension   Hyperlipidemia   Chronic diastolic CHF (congestive heart failure) (HCC)   Warfarin-induced coagulopathy (HCC)   Closed compression fracture of L2 lumbar vertebra (HCC)   Intractable back pain  Intractable low back pain: likely 2/2 L2 compression fracture. --Lumbar x ray on presentation: Moderate L2 compression fracture age indeterminate, unchanged from the recent CT and new since 06/11/2015. Mild spondylosis of the lumbar spine with disc disease at the L4-5 level. Grade 1 anterolisthesis of L4 on L5 due to the moderate facet arthropathy.  --apply TLSO brace use morphine IV for intractable pain. PT evaluation  --my colleague Dr Sloan Leiter discussed the case with Dr. Rosalee Kaufman on-call-who suggested that we continue with supportive measures, and if he continues to have pain consult interventional radiology for possible kyphoplasty.  -IR Consulted, patient is deemed to be a candidate for kyphoplasty once INR less than 1.6, vit k given on 5/21, keep npo aftermidnight in case.    Coagulopathy:  INR 5.3 on admission On warfarin-recently completed a antimicrobial course-INR likely elevated due to drug interaction.  Hold Coumadin, iv vitk on 5/21 goal of INR less than 1.6 for IR procedure, heparin drip per pharmacy need to confirm with IR about resuming coumadin post procedure  Chronic Atrial fibrillation:  currently rate controlled without the use of any rate control agent ( h/o bradycardia on betablocker) on coumadin and asa 325 at home, presented with  supratherapeutic INR Hold both, need heparin drip while INR subtherapeutic for IR procedure, pharmacy consulted  H/o diastolic chf: euvolemic ,continue home meds lasix , close monitor volume status  UTI: Continue Rocephin, await cultures.  noninsulin dependentType 2 diabetes:  Hold oral agents januvia-place on SSI and follow CBGs.  Hypertension: Continue amlodipine-and follow BP trend  BPH:  s/p TURP in 2014, Continue Flomax/proscar  Dyslipidemia: Continue statin  Gout: Continue allopurinol-no evidence of flare at this time.  FTT: SNF vs ALF  Above noted plan was discussed with patient  And two friends at bedside with patient's permission, he was in agreement.  Patient report no immediate family in town  CONSULTS: Neurosurgery Dr Ronnald Ramp by phone IR  DVT Prophylaxis: SCD's  Code Status: Full Code  Disposition Plan: SNF vs ALF  Procedures:  Plan for kyphoplasty  Antibiotics:  Rocephin from admission   Objective: BP 115/72 (BP Location: Left Arm)   Pulse 64   Temp 97.8 F (36.6 C) (Oral)   Resp 20   Ht 5\' 8"  (1.727 m)   Wt 89.3 kg (196 lb 13.9 oz)   SpO2 100%   BMI 29.93 kg/m   Intake/Output Summary (Last 24 hours) at 07/21/16 1730 Last data filed at 07/21/16 1400  Gross per 24 hour  Intake              410 ml  Output              450 ml  Net              -40 ml   Filed Weights   07/19/16 2009 07/20/16 0826  Weight: 95.3 kg (210 lb) 89.3  kg (196 lb 13.9 oz)    Exam:   General:  Frail, NAD  Cardiovascular: IRRR  Respiratory: CTABL  Abdomen: Soft/ND/NT, positive BS  Musculoskeletal: No Edema  Neuro: aaox3  Data Reviewed: Basic Metabolic Panel:  Recent Labs Lab 07/20/16 0505 07/21/16 0535  NA 136 139  K 3.9 3.4*  CL 102 99*  CO2 22 29  GLUCOSE 126* 114*  BUN 12 13  CREATININE 0.89 0.96  CALCIUM 8.6* 9.1   Liver Function Tests: No results for input(s): AST, ALT, ALKPHOS, BILITOT, PROT, ALBUMIN in the last 168  hours. No results for input(s): LIPASE, AMYLASE in the last 168 hours. No results for input(s): AMMONIA in the last 168 hours. CBC:  Recent Labs Lab 07/20/16 0505 07/21/16 0535  WBC 12.2* 10.4  NEUTROABS 9.7*  --   HGB 13.2 13.6  HCT 40.6 41.7  MCV 95.3 96.5  PLT 197 196   Cardiac Enzymes:   No results for input(s): CKTOTAL, CKMB, CKMBINDEX, TROPONINI in the last 168 hours. BNP (last 3 results)  Recent Labs  09/26/15 1053  BNP 334.0*    ProBNP (last 3 results)  Recent Labs  09/07/15 1107  PROBNP 566.0*    CBG:  Recent Labs Lab 07/20/16 1618 07/20/16 2045 07/21/16 0744 07/21/16 1127 07/21/16 1709  GLUCAP 123* 136* 131* 127* 138*    Recent Results (from the past 240 hour(s))  Urine culture     Status: None   Collection Time: 07/15/16 11:52 AM  Result Value Ref Range Status   Organism ID, Bacteria NO GROWTH  Final  Urine culture     Status: Abnormal (Preliminary result)   Collection Time: 07/19/16  8:24 PM  Result Value Ref Range Status   Specimen Description URINE, CLEAN CATCH  Final   Special Requests NONE  Final   Culture (A)  Final    20,000 COLONIES/mL CITROBACTER FREUNDII SUSCEPTIBILITIES TO FOLLOW Performed at [facility], [place] [address], [place], [place] [address]    Report Status PENDING  Incomplete     Studies: No results found.  Scheduled Meds: . acetaminophen  1,000 mg Oral Q8H  . allopurinol  300 mg Oral Daily  . amLODipine  2.5 mg Oral Daily  . atorvastatin  20 mg Oral q1800  . calcium-vitamin D  1 tablet Oral QHS  . [START ON 07/23/2016] fentaNYL  25 mcg Transdermal Q72H  . [START ON 07/22/2016] ferrous sulfate  325 mg Oral Q breakfast  . finasteride  5 mg Oral q morning - 10a  . furosemide  40 mg Oral Daily  . gabapentin  100 mg Oral TID  . insulin aspart  0-9 Units Subcutaneous TID WC  . multivitamin with minerals  1 tablet Oral Daily  . pantoprazole  40 mg Oral Daily  . polyethylene glycol  17 g Oral Daily  .  potassium chloride  10 mEq Oral Daily  . senna  2 tablet Oral QHS  . sodium chloride flush  3 mL Intravenous Q12H  . tamsulosin  0.4 mg Oral QHS  . traZODone  50 mg Oral QHS    Continuous Infusions: . sodium chloride    . cefTRIAXone (ROCEPHIN)  IV Stopped (07/20/16 2157)  . phytonadione (VITAMIN K) IV 10 mg (07/21/16 1721)     Time spent: 42mins  Arretta Toenjes MD, PhD  [facility] Pager [phone]. If 7PM-7AM, please contact night-coverage at [email or web address], password [facility] 07/21/2016, 5:30 PM  LOS: 1 day

## 2016-07-22 ENCOUNTER — Ambulatory Visit: Payer: Medicare Other | Admitting: Internal Medicine

## 2016-07-22 ENCOUNTER — Other Ambulatory Visit: Payer: Medicare Other

## 2016-07-22 LAB — BASIC METABOLIC PANEL
Anion gap: 8 (ref 5–15)
BUN: 15 mg/dL (ref 4–21)
BUN: 15 mg/dL (ref 6–20)
CALCIUM: 9.2 mg/dL (ref 8.9–10.3)
CO2: 30 mmol/L (ref 22–32)
CREATININE: 0.93 mg/dL (ref 0.61–1.24)
Chloride: 99 mmol/L — ABNORMAL LOW (ref 101–111)
Creatinine: 0.9 mg/dL (ref 0.6–1.3)
GFR calc non Af Amer: >60 mL/min (ref >60)
Glucose, Bld: 126 mg/dL — ABNORMAL HIGH (ref 65–99)
Glucose: 126 mg/dL
Potassium: 3.6 mmol/L (ref 3.4–5.3)
Potassium: 3.6 mmol/L (ref 3.5–5.1)
SODIUM: 137 mmol/L (ref 137–147)
SODIUM: 137 mmol/L (ref 135–145)

## 2016-07-22 LAB — HEPARIN LEVEL (UNFRACTIONATED): Heparin Unfractionated: 0.41 IU/mL (ref 0.30–0.70)

## 2016-07-22 LAB — URINE CULTURE: Culture: 20000 — AB

## 2016-07-22 LAB — PROTIME-INR
INR: 1.6
PROTHROMBIN TIME: 19.2 s — AB (ref 11.4–15.2)
Protime: 19.2 seconds — AB (ref 10.0–13.8)

## 2016-07-22 LAB — GLUCOSE, CAPILLARY
GLUCOSE-CAPILLARY: 187 mg/dL — AB (ref 65–99)
Glucose-Capillary: 137 mg/dL — ABNORMAL HIGH (ref 65–99)
Glucose-Capillary: 122 mg/dL — ABNORMAL HIGH (ref 65–99)
Glucose-Capillary: 157 mg/dL — ABNORMAL HIGH (ref 65–99)
Glucose-Capillary: 147 mg/dL — ABNORMAL HIGH (ref 65–99)

## 2016-07-22 LAB — MAGNESIUM: Magnesium: 1.7 mg/dL (ref 1.7–2.4)

## 2016-07-22 LAB — POCT INR: INR: 1.6 — AB (ref 0.9–1.1)

## 2016-07-22 MED ORDER — CIPROFLOXACIN IN D5W 200 MG/100ML IV SOLN
200.0000 mg | Freq: Two times a day (BID) | INTRAVENOUS | Status: DC
  Administered 2016-07-22: 200 mg via INTRAVENOUS
  Filled 2016-07-22 (×2): qty 100

## 2016-07-22 MED ORDER — FENTANYL 25 MCG/HR TD PT72
25.0000 ug | MEDICATED_PATCH | TRANSDERMAL | Status: DC
  Administered 2016-07-22: 25 ug via TRANSDERMAL
  Filled 2016-07-22: qty 1

## 2016-07-22 MED ORDER — HEPARIN (PORCINE) IN NACL 100-0.45 UNIT/ML-% IJ SOLN
1000.0000 [IU]/h | INTRAMUSCULAR | Status: DC
  Administered 2016-07-22 – 2016-07-23 (×2): 1000 [IU]/h via INTRAVENOUS
  Filled 2016-07-22 (×2): qty 250

## 2016-07-22 MED ORDER — POTASSIUM CHLORIDE CRYS ER 20 MEQ PO TBCR
30.0000 meq | EXTENDED_RELEASE_TABLET | Freq: Once | ORAL | Status: AC
  Administered 2016-07-22: 30 meq via ORAL
  Filled 2016-07-22: qty 3

## 2016-07-22 MED ORDER — SENNOSIDES-DOCUSATE SODIUM 8.6-50 MG PO TABS
1.0000 | ORAL_TABLET | Freq: Two times a day (BID) | ORAL | Status: DC
  Administered 2016-07-22 – 2016-07-28 (×13): 1 via ORAL
  Filled 2016-07-22 (×13): qty 1

## 2016-07-22 MED ORDER — POLYETHYLENE GLYCOL 3350 17 G PO PACK: 17.0000 g | PACK | Freq: Every day | ORAL | Status: DC

## 2016-07-22 MED ORDER — PREDNISONE 50 MG PO TABS
50.0000 mg | ORAL_TABLET | Freq: Every day | ORAL | Status: AC
  Administered 2016-07-23: 50 mg via ORAL
  Filled 2016-07-22: qty 1

## 2016-07-22 MED ORDER — PREDNISONE 5 MG PO TABS
10.0000 mg | ORAL_TABLET | Freq: Every day | ORAL | Status: AC
  Administered 2016-07-26: 10 mg via ORAL
  Filled 2016-07-22: qty 2

## 2016-07-22 MED ORDER — DEXTROSE 5 % IV SOLN
2.0000 g | Freq: Two times a day (BID) | INTRAVENOUS | Status: DC
  Administered 2016-07-22 – 2016-07-26 (×8): 2 g via INTRAVENOUS
  Filled 2016-07-22 (×8): qty 2

## 2016-07-22 MED ORDER — METHYLPREDNISOLONE SODIUM SUCC 125 MG IJ SOLR
60.0000 mg | Freq: Once | INTRAMUSCULAR | Status: AC
  Administered 2016-07-22: 60 mg via INTRAVENOUS

## 2016-07-22 MED ORDER — PREDNISONE 20 MG PO TABS
20.0000 mg | ORAL_TABLET | Freq: Every day | ORAL | Status: AC
  Administered 2016-07-25: 20 mg via ORAL
  Filled 2016-07-22: qty 1

## 2016-07-22 MED ORDER — SODIUM CHLORIDE 0.9 % IV BOLUS (SEPSIS)
500.0000 mL | Freq: Once | INTRAVENOUS | Status: AC
  Administered 2016-07-22: 500 mL via INTRAVENOUS

## 2016-07-22 MED ORDER — PREDNISONE 20 MG PO TABS
40.0000 mg | ORAL_TABLET | Freq: Every day | ORAL | Status: AC
  Administered 2016-07-24: 40 mg via ORAL
  Filled 2016-07-22: qty 2

## 2016-07-22 NOTE — Progress Notes (Signed)
[place] for Heparin Indication: atrial fibrillation  Allergies  Allergen Reactions  . Metoprolol Other (See Comments)    Reaction:  Bradycardia with 93m BID dose    Patient Measurements: Height: _0  (172.7 cm) Weight: 196 lb 13.9 oz (89.3 kg) IBW/kg (Calculated) : 68.4 Heparin Dosing Weight: 87 kg  Vital Signs: Temp: 97.5 F (36.4 C) (05/22 0530) Temp Source: Oral (05/22 0530) BP: 141/65 (05/22 0530) Pulse Rate: 65 (05/22 0530)  Labs:  Recent Labs  07/20/16 0505 07/21/16 0535 07/22/16 0531  HGB 13.2 13.6  --   HCT 40.6 41.7  --   PLT 197 196  --   LABPROT 50.2* 47.3* 19.2*  INR 5.31* 4.93* 1.60  CREATININE 0.89 0.96 0.93    Estimated Creatinine Clearance: 64.2 mL/min (by C-G formula based on SCr of 0.93 mg/dL).   Medical History: Past Medical History:  Diagnosis Date  . ACE-inhibitor cough   . Aortic dissection ([facility]    Surgical repair February, 2014  . Aortic stenosis    mild....echo... september... 2010/ mild... echo..[name] KitchenMarland [place], 2011  . Atrial fibrillation (HCC)     Chronic,   24 hour holter, September, 2011.... atrial fib rate is controlled.... there is some bradycardia but  no marked pauses  . Bladder cancer ([facility]    recurrence with TUR-B March '09  . DDD (degenerative disc disease)    lumbar spine  . Diabetes mellitus    type 2  . Diabetes mellitus with neuropathy ([place] 12/04/2006   Qualifier: Diagnosis of  By: NLinda HedgesMD, MHeinz Knuckles medications - DPP4   . Diverticulosis of colon with hemorrhage 01/01/2014  . Ejection fraction    EF 60%, echo, 01/2010  . Fluid overload   . Fracture of metacarpal bone 11/30/11  . GERD (gastroesophageal reflux disease)   . Gout   . History of echocardiogram    Echo 5/17 - mild LVH, EF 55-60%, mod AS (mean 16 mmHg, peak 29 mmHg), MAC, midl MR, massive BAE, mod dilated RV, low normal RVSF, mod TR, PASP 64 mmHg  . Hx of colonoscopy    approx. 10 years  . Hypercholesterolemia   .  Hyperlipidemia   . Hypertension   . Mitral regurgitation   . Normal nuclear stress test    06/2005 , also ABI normal 2007  . Prostate cancer ([place]    radiation tx.  . Pulmonary hypertension ([place]    466mg, echo, 01/2010  . Shortness of breath    on exertion  . Warfarin anticoagulation    Atrial fib  . Whooping cough      Assessment:  [age]r male admitted on 5/20 with low back pain (xray shows L2 compression fracture).  PMH significant for AFib and patient on warfarin PTA and UTI (recently completed course of antibiotics).  Admitting INR = 5.31 with warfarin regimen of 77m62maily except 6mg777m Thursdays (last dose taken 5/19)  INR (5/21) = 4.93;  Vitamin K 10mg80mx 1 given yesterday (5/21)  Plan for kyphoplasty once INR < 1.6  IV heparin per pharmacy to begin when INR < 2  Today, 07/22/2016  INR down 1.6 today so will begin heparin without a bolus  Goal of Therapy:  Heparin level 0.3-0.7 units/ml Monitor platelets by anticoagulation protocol: Yes   Plan:   Begin IV heparin 1000 units/hr (~12 units/kg/hr)  Check heparin level in 8hr  Follow up plans for kyphoplasty  Deamber Buckhalter Peggyann JubarmD, BCPS Pager: [phone]/2018,7:09 AM

## 2016-07-22 NOTE — Progress Notes (Signed)
Pt HR sustaining 40's and decreases to 36-39. MD notified. EKG done. Will continue to monitor.

## 2016-07-22 NOTE — Progress Notes (Addendum)
Notified by Central Telemetry that pt HR was sustaining in the  50's with deceases to 39-45. BP 85/49. Pt drowsy , but awakens to verbal stimuli.  MD made aware. Fentanyl patch removed per Dr Erlinda Hong.  New orders placed in EPIC and followed out.

## 2016-07-22 NOTE — Progress Notes (Signed)
PT Cancellation Note  Patient Details Name: Eric Rivers MRN: [id number] DOB: 06-14-1930   Cancelled Treatment:     cancel per RN due to low BP and HR.  Will check back another day as schedule permits.     Rica Koyanagi  PTA [facility]  Acute  Rehab Pager      [phone]

## 2016-07-22 NOTE — Clinical Social Work Note (Signed)
Clinical Social Work Assessment  Patient Details  Name: Eric Rivers MRN: [id number] Date of Birth: [birth year]  Date of referral:  07/22/16               Reason for consult:  Facility Placement                Permission sought to share information with:  Chartered certified accountant granted to share information::  Yes, Verbal Permission Granted  Name::        Agency::     Relationship::     Contact Information:     Housing/Transportation Living arrangements for the past 2 months:  Single Family Home Source of Information:  Patient, Friend/Neighbor Patient Interpreter Needed:  None Criminal Activity/Legal Involvement Pertinent to Current Situation/Hospitalization:  No - Comment as needed Significant Relationships:  Friend Lives with:  Self Do you feel safe going back to the place where you live?  Yes Need for family participation in patient care:  No (Coment)  Care giving concerns:  Patient currently not able to ambulate and resides alone. Patient receives help from an aide 3x/week.  Social Worker assessment / plan:  CSW spoke with patient and patient's friend Clair Gulling Menius [phone]) at bedside. CSW discussed PT recommendation with patient and patient's friend, patient reported that he prefers to go to [facility] ALF and is open to going to SNF if a higher level of care is recommended by PT once patient gets closer to discharge. Patient's friend reported that he is going to [facility] ALF later today and that he is going to speak with staff about process if patient requires ALF vs their SNF. Patient reported that prior to hospitalization that he was walking freely without DME and that he had friends to assist with errands and an aide coming in 3x/week. Patient reports that in the past 6 months he has encountered 3 or 4 falls and has needed assistance to get up. Patient reported that he has been to SNF for ST rehab in the past and that it was a pleasant  experience. CSW will complete FL2 and assist with discharge planning according to PT recommendations.   Employment status:  Retired Health visitor, Managed Care PT Recommendations:   (Home health PT;SNF ([facility] [place] vs SNF depending on progress)) Information / Referral to community resources:  [place]  Patient/Family's Response to care:  Patient prefers to go to ALF but agreeable to SNF if recommended by PT.   Patient/Family's Understanding of and Emotional Response to Diagnosis, Current Treatment, and Prognosis:  Patient presented pleasant throughout the duration of assessment. Patient's friend very involved in patient's care and being proactive by visiting and corresponding with desired facility. Patient and patient's friend able to verbalize understanding about patient's diagnosis and current treatment, noting patient is currently on antibiotics and that patient has a procedure scheduled for Friday.   Emotional Assessment Appearance:  Appears stated age Attitude/Demeanor/Rapport:  Other (Open) Affect (typically observed):  Pleasant Orientation:  Oriented to Self, Oriented to Place, Oriented to  Time, Oriented to Situation Alcohol / Substance use:  Not Applicable Psych involvement (Current and /or in the community):  No (Comment)  Discharge Needs  Concerns to be addressed:  Care Coordination Readmission within the last 30 days:  No Current discharge risk:  None Barriers to Discharge:  Continued Medical Work up   [employer], LCSW 07/22/2016, 11:26 AM

## 2016-07-22 NOTE — Progress Notes (Signed)
[place] for Heparin Indication: atrial fibrillation  Allergies  Allergen Reactions  . Metoprolol Other (See Comments)    Reaction:  Bradycardia with 37m BID dose    Patient Measurements: Height: _0  (172.7 cm) Weight: 196 lb 13.9 oz (89.3 kg) IBW/kg (Calculated) : 68.4 Heparin Dosing Weight: 87 kg  Vital Signs: Temp: 97.6 F (36.4 C) (05/22 1407) Temp Source: Rectal (05/22 1407) BP: 97/56 (05/22 1407) Pulse Rate: 48 (05/22 1407)  Labs:  Recent Labs  07/20/16 0505 07/21/16 0535 07/22/16 0531 07/22/16 1552  HGB 13.2 13.6  --   --   HCT 40.6 41.7  --   --   PLT 197 196  --   --   LABPROT 50.2* 47.3* 19.2*  --   INR 5.31* 4.93* 1.60  --   HEPARINUNFRC  --   --   --  0.41  CREATININE 0.89 0.96 0.93  --     Estimated Creatinine Clearance: 64.2 mL/min (by C-G formula based on SCr of 0.93 mg/dL).   Medical History: Past Medical History:  Diagnosis Date  . ACE-inhibitor cough   . Aortic dissection ([facility]    Surgical repair February, 2014  . Aortic stenosis    mild....echo... september... 2010/ mild... echo..[name] KitchenMarland [place], 2011  . Atrial fibrillation (HCC)     Chronic,   24 hour holter, September, 2011.... atrial fib rate is controlled.... there is some bradycardia but  no marked pauses  . Bladder cancer ([facility]    recurrence with TUR-B March '09  . DDD (degenerative disc disease)    lumbar spine  . Diabetes mellitus    type 2  . Diabetes mellitus with neuropathy ([place] 12/04/2006   Qualifier: Diagnosis of  By: NLinda HedgesMD, MHeinz Knuckles medications - DPP4   . Diverticulosis of colon with hemorrhage 01/01/2014  . Ejection fraction    EF 60%, echo, 01/2010  . Fluid overload   . Fracture of metacarpal bone 11/30/11  . GERD (gastroesophageal reflux disease)   . Gout   . History of echocardiogram    Echo 5/17 - mild LVH, EF 55-60%, mod AS (mean 16 mmHg, peak 29 mmHg), MAC, midl MR, massive BAE, mod dilated RV, low normal RVSF, mod TR, PASP  64 mmHg  . Hx of colonoscopy    approx. 10 years  . Hypercholesterolemia   . Hyperlipidemia   . Hypertension   . Mitral regurgitation   . Normal nuclear stress test    06/2005 , also ABI normal 2007  . [place]state [place] ([place]    radiation tx.  . Pulmonary hypertension ([place]    421mg, echo, 01/2010  . Shortness of breath    on exertion  . Warfarin anticoagulation    Atrial fib  . Whooping cough      Assessment:  [age]r male admitted on 5/20 with low back pain (xray shows L2 compression fracture).  PMH significant for AFib and patient on warfarin PTA and UTI (recently completed course of antibiotics).  Admitting INR = 5.31 with warfarin regimen of 50m50maily except 6mg27m Thursdays (last dose taken 5/19)  INR (5/21) = 4.93;  Vitamin K 10mg650mx 1 given yesterday (5/21)  Plan for kyphoplasty once INR < 1.6  IV heparin per pharmacy to begin when INR < 2  Today, 07/22/2016  INR down 1.6 today so will begin heparin without a bolus  07/22/16: 2nd shift update Heparin level = 0.41 with heparin infusing @ 1000 units/hr No complications  of therapy noted  Goal of Therapy:  Heparin level 0.3-0.7 units/ml Monitor platelets by anticoagulation protocol: Yes   Plan:   Continue IV heparin 1000 units/hr (~12 units/kg/hr)  Repeat heparin level in 8hr to confirm therapeutic dose  Follow up plans for kyphoplasty  Leone Haven, PharmD 07/22/2016,5:32 PM

## 2016-07-22 NOTE — Consult Note (Signed)
   [facility] Inpatient Consult   07/22/2016  Eric Rivers 03/12/1930 [id number]    Patient screened for potential [facility] Care Management services. Chart reviewed. Noted current discharge plan is likely for  SNF vs ALF. Discussed with inpatient LCSW. There are no identifiable [facility] Care Management needs at this time. If patient's post hospital needs change, please place a [facility] Care Management consult. For questions please contact:  Marthenia Rolling, [place], RN,BSN [facility] Liaison [phone]

## 2016-07-22 NOTE — Progress Notes (Addendum)
PROGRESS NOTE  Eric Rivers LYY:[id number] DOB: 1930-11-24 DOA: 07/19/2016 PCP: Biagio Borg, MD  Brief summary:  CHIEF COMPLAINT:  Intractable Low Back Pain  HPI: Eric Rivers is a 83 y.o. male with medical history significant of DM-2, HTN, Afib on coumadin, recently diagnosed with L2 compression fracture-presented to the ED for evaluation of intractable back pain. He recently was seen in the ED on and 5/10 and 5/14 for similar problems. He had a mechanical fall earlier this month, with gradual worsening of his low back pain. A MRI LS Spine on 5/14 confirmed a L2 compression fracture, he was started on oral narcotics and other supportive measures, but continued to have pain. Pain is mostly in the lower back, 10/10 at its worst. Oxycodone only provides some relief, pain is aggravated by moving. No associated fever, urinary or fecal incontinence. Pain has now limited his ambulation. He presented back to the ED on 5/19-continued to have pain inspite of narcotics, benzo's, and was also found to have a UTI and coagulopathy. Hence the hospitalist service was asked to admit this patient for further evaluation and treatment.   Per patient, he recently completed a course of Antibiotic a week ago for UTI, he however continues to have dysuria and frequency of urination. He denies and hematuria, fever or flank pain   HPI/Recap of past 24 hours:  Having a lot of pain this am,  denies urinary symptom, no fever, no abdominal pain No edema Friend at bedside  Assessment/Plan: Principal Problem:   UTI (urinary tract infection) Active Problems:   Diabetes mellitus with neuropathy (HCC)   Chronic atrial fibrillation (HCC)   Hypertension   Hyperlipidemia   Chronic diastolic CHF (congestive heart failure) (HCC)   Warfarin-induced coagulopathy (HCC)   Closed compression fracture of L2 lumbar vertebra (HCC)   Intractable back pain  Intractable low back pain: likely 2/2 L2 compression  fracture. --Lumbar x ray on presentation: Moderate L2 compression fracture age indeterminate, unchanged from the recent CT and new since 06/11/2015. Mild spondylosis of the lumbar spine with disc disease at the L4-5 level. Grade 1 anterolisthesis of L4 on L5 due to the moderate facet arthropathy.  -- Dr Sloan Leiter discussed the case with Dr. Rosalee Kaufman on-call-who suggested that we continue with supportive measures, and if he continues to have pain consult interventional radiology for possible kyphoplasty.  -IR Consulted, patient is deemed to be a candidate for kyphoplasty once INR less than 1.6, and UTI adequated treated, likely Friday -apply TLSO brace use morphine IV for intractable pain. Still has lots of pain, adjust pain meds, stool regimen, start trial of  steroid taper  Coagulopathy:  --NR 5.3 on admission --elevated  INR likely due to drug interaction. (Patient has recently completed a antimicrobial course- prior to hospitalization) -Coumadin held since admission, iv vitk on 5/21 goal of INR less than 1.6 for IR procedure, heparin drip per pharmacy -need to confirm with IR about resuming coumadin post procedure   Recurrent UTI:  -CT renal stone: no hydro, no renal stone, Bladder is underdistended. Stable high density material/calcification along the posterior aspect of the bladder (series 2/image 66) extending into a prostate TURP defect (series 2/image 71). -UA rbc TNTC, wbc TNTC -urine culture Citrobacter that is resistant to rocephin, change abx to cipro on 5/22. -I discussed the CT scan finding with urology on call Dr Karsten Ro who recommended finish abx treatment and outpatient follow with Dr Tresa Moore within one week of discharge to consider cystoscope and stone  removal.  Chronic Atrial fibrillation:  --currently rate controlled without the use of any rate control agent ( h/o bradycardia on betablocker) --on coumadin and asa 325 at home, presented with supratherapeutic INR --  both held since admission, heparin drip per pharmacy for now  H/o diastolic chf: euvolemic ,continue home meds lasix , close monitor volume status  noninsulin dependentType 2 diabetes:  Hold oral agents januvia-place on SSI and follow CBGs. Monitor blood sugar  While on short course of steroids  Hypertension: Continue amlodipine-and follow BP trend  BPH:  s/p TURP in 2014, Continue Flomax/proscar  Dyslipidemia: Continue statin  Gout: Continue allopurinol-no evidence of flare at this time.  FTT: SNF vs ALF  Above noted plan was discussed with patient  And a friend at bedside with patient's permission, he was in agreement.  Patient report no immediate family in town  12:50pm addendum: RN report patient became drowsy after put on fentanyl patch, he also has bradycardia ( heart rate dropped to 38 during sleep), his blood pressure 85/49 This is a acute changes after round on patient this am Etiology from oversedation from narcotics? Remove fentanyl patch, d/c lasix/ norvasx, ivf 500cc fluids bolus for now Acute changes could also be from uncontrolled uti, he was treated with rocephin since admission which the citrobacter is resistant to, cipro started on 5/22, will need to close monitor.  Patient also received prn zanaflex and cipro which all could cause bradycardia, abx changed to cefepime, zanaflex prn( which RN report that help patient's spasm) continued with parameter ( hold if HR less than 50)  CONSULTS: Neurosurgery Dr Ronnald Ramp by phone on admission IR Urology Dr Karsten Ro over the phone on 5/22  DVT Prophylaxis: SCD's  Code Status: DNR  Disposition Plan: SNF vs ALF May need to discharge to snf then  return back to have kyphoplasty, depend on clinical course and IR schedule.   Procedures:  Plan for kyphoplasty, might be early next week, IR prefer uti fully treated prior to porcedure   Antibiotics:  Rocephin from admission to 5/22  cipro from  5/22   Objective: BP (!) 141/65 (BP Location: Left Arm)   Pulse 65   Temp 97.5 F (36.4 C) (Oral)   Resp 20   Ht 5\' 8"  (1.727 m)   Wt 89.3 kg (196 lb 13.9 oz)   SpO2 93%   BMI 29.93 kg/m   Intake/Output Summary (Last 24 hours) at 07/22/16 1036 Last data filed at 07/22/16 0100  Gross per 24 hour  Intake              120 ml  Output              750 ml  Net             -630 ml   Filed Weights   07/19/16 2009 07/20/16 0826  Weight: 95.3 kg (210 lb) 89.3 kg (196 lb 13.9 oz)    Exam:   General:  In pain  Cardiovascular: IRRR  Respiratory: CTABL  Abdomen: Soft/ND/NT, positive BS  Musculoskeletal: No Edema  Neuro: aaox3  Data Reviewed: Basic Metabolic Panel:  Recent Labs Lab 07/20/16 0505 07/21/16 0535 07/22/16 0531  NA 136 139 137  K 3.9 3.4* 3.6  CL 102 99* 99*  CO2 22 29 30   GLUCOSE 126* 114* 126*  BUN 12 13 15   CREATININE 0.89 0.96 0.93  CALCIUM 8.6* 9.1 9.2  MG  --   --  1.7   Liver Function Tests: No  results for input(s): AST, ALT, ALKPHOS, BILITOT, PROT, ALBUMIN in the last 168 hours. No results for input(s): LIPASE, AMYLASE in the last 168 hours. No results for input(s): AMMONIA in the last 168 hours. CBC:  Recent Labs Lab 07/20/16 0505 07/21/16 0535  WBC 12.2* 10.4  NEUTROABS 9.7*  --   HGB 13.2 13.6  HCT 40.6 41.7  MCV 95.3 96.5  PLT 197 196   Cardiac Enzymes:   No results for input(s): CKTOTAL, CKMB, CKMBINDEX, TROPONINI in the last 168 hours. BNP (last 3 results)  Recent Labs  09/26/15 1053  BNP 334.0*    ProBNP (last 3 results)  Recent Labs  09/07/15 1107  PROBNP 566.0*    CBG:  Recent Labs Lab 07/21/16 0744 07/21/16 1127 07/21/16 1709 07/21/16 2327 07/22/16 0735  GLUCAP 131* 127* 138* 137* 122*    Recent Results (from the past 240 hour(s))  Urine culture     Status: None   Collection Time: 07/15/16 11:52 AM  Result Value Ref Range Status   Organism ID, Bacteria NO GROWTH  Final  Urine culture      Status: Abnormal   Collection Time: 07/19/16  8:24 PM  Result Value Ref Range Status   Specimen Description URINE, CLEAN CATCH  Final   Special Requests NONE  Final   Culture 20,000 COLONIES/mL CITROBACTER FREUNDII (A)  Final   Report Status 07/22/2016 FINAL  Final   Organism ID, Bacteria CITROBACTER FREUNDII (A)  Final      Susceptibility   Citrobacter freundii - MIC*    CEFAZOLIN >=64 RESISTANT Resistant     CEFTRIAXONE >=64 RESISTANT Resistant     CIPROFLOXACIN <=0.25 SENSITIVE Sensitive     GENTAMICIN <=1 SENSITIVE Sensitive     IMIPENEM <=0.25 SENSITIVE Sensitive     NITROFURANTOIN <=16 SENSITIVE Sensitive     TRIMETH/SULFA <=20 SENSITIVE Sensitive     PIP/TAZO 64 INTERMEDIATE Intermediate     * 20,000 COLONIES/mL CITROBACTER FREUNDII     Studies: No results found.  Scheduled Meds: . acetaminophen  1,000 mg Oral Q8H  . allopurinol  300 mg Oral Daily  . amLODipine  2.5 mg Oral Daily  . atorvastatin  20 mg Oral q1800  . calcium-vitamin D  1 tablet Oral QHS  . fentaNYL  25 mcg Transdermal Q72H  . ferrous sulfate  325 mg Oral Q breakfast  . finasteride  5 mg Oral q morning - 10a  . furosemide  40 mg Oral Daily  . gabapentin  100 mg Oral TID  . insulin aspart  0-9 Units Subcutaneous TID WC  . methylPREDNISolone (SOLU-MEDROL) injection  60 mg Intravenous Once  . multivitamin with minerals  1 tablet Oral Daily  . pantoprazole  40 mg Oral Daily  . polyethylene glycol  17 g Oral Daily  . potassium chloride  10 mEq Oral Daily  . potassium chloride  40 mEq Oral Once  . [START ON 07/26/2016] predniSONE  10 mg Oral Q breakfast  . [START ON 07/23/2016] predniSONE  50 mg Oral Q breakfast  . [START ON 07/23/2016] predniSONE  50 mg Oral Q breakfast  . [START ON 07/23/2016] predniSONE  50 mg Oral Q breakfast  . senna-docusate  1 tablet Oral BID  . sodium chloride flush  3 mL Intravenous Q12H  . tamsulosin  0.4 mg Oral QHS  . traZODone  50 mg Oral QHS    Continuous Infusions: .  sodium chloride    . cefTRIAXone (ROCEPHIN)  IV Stopped (07/21/16 2040)  .  ciprofloxacin    . heparin 1,000 Units/hr (07/22/16 0800)     Time spent: 33mins  Cylis Ayars MD, PhD  [facility] Pager [phone]. If 7PM-7AM, please contact night-coverage at [email or web address], password [facility] 07/22/2016, 10:36 AM  LOS: 2 days

## 2016-07-23 DIAGNOSIS — S32020A Wedge compression fracture of second lumbar vertebra, initial encounter for closed fracture: Secondary | ICD-10-CM

## 2016-07-23 DIAGNOSIS — N390 Urinary tract infection, site not specified: Secondary | ICD-10-CM

## 2016-07-23 LAB — CBC AND DIFFERENTIAL
HCT: 38 % — AB (ref 41–53)
Hemoglobin: 12.5 g/dL — AB (ref 13.5–17.5)
PLATELETS: 219 10*3/uL (ref 150–399)
WBC: 12.3 10*3/mL

## 2016-07-23 LAB — BASIC METABOLIC PANEL
Anion gap: 7 (ref 5–15)
BUN: 19 mg/dL (ref 4–21)
BUN: 19 mg/dL (ref 6–20)
CALCIUM: 9 mg/dL (ref 8.9–10.3)
CO2: 26 mmol/L (ref 22–32)
CREATININE: 0.99 mg/dL (ref 0.61–1.24)
Chloride: 102 mmol/L (ref 101–111)
Creatinine: 1 mg/dL (ref 0.6–1.3)
GFR calc Af Amer: >60 mL/min (ref >60)
GLUCOSE: 171 mg/dL
GLUCOSE: 171 mg/dL — AB (ref 65–99)
Potassium: 4.4 mmol/L (ref 3.4–5.3)
Potassium: 4.4 mmol/L (ref 3.5–5.1)
SODIUM: 135 mmol/L (ref 135–145)
Sodium: 135 mmol/L — AB (ref 137–147)

## 2016-07-23 LAB — CBC
HEMATOCRIT: 38.1 % — AB (ref 39.0–52.0)
HEMOGLOBIN: 12.5 g/dL — AB (ref 13.0–17.0)
MCH: 31.1 pg (ref 26.0–34.0)
MCHC: 32.8 g/dL (ref 30.0–36.0)
MCV: 94.8 fL (ref 78.0–100.0)
Platelets: 219 10*3/uL (ref 150–400)
RBC: 4.02 MIL/uL — ABNORMAL LOW (ref 4.22–5.81)
RDW: 15.2 % (ref 11.5–15.5)
WBC: 12.3 10*3/uL — ABNORMAL HIGH (ref 4.0–10.5)

## 2016-07-23 LAB — GLUCOSE, CAPILLARY
Glucose-Capillary: 144 mg/dL — ABNORMAL HIGH (ref 65–99)
Glucose-Capillary: 143 mg/dL — ABNORMAL HIGH (ref 65–99)
Glucose-Capillary: 161 mg/dL — ABNORMAL HIGH (ref 65–99)
Glucose-Capillary: 159 mg/dL — ABNORMAL HIGH (ref 65–99)

## 2016-07-23 LAB — POCT INR: INR: 1.3 — AB (ref 0.9–1.1)

## 2016-07-23 LAB — HEPARIN LEVEL (UNFRACTIONATED)
HEPARIN UNFRACTIONATED: 0.55 [IU]/mL (ref 0.30–0.70)
Heparin Unfractionated: 0.78 IU/mL — ABNORMAL HIGH (ref 0.30–0.70)
Heparin Unfractionated: 0.45 IU/mL (ref 0.30–0.70)

## 2016-07-23 LAB — PROTIME-INR
INR: 1.29
PROTHROMBIN TIME: 16.2 s — AB (ref 11.4–15.2)
Protime: 16.2 seconds — AB (ref 10.0–13.8)

## 2016-07-23 LAB — HEMOGLOBIN A1C
Hgb A1c MFr Bld: 5.9 % — ABNORMAL HIGH (ref 4.8–5.6)
Mean Plasma Glucose: 123 mg/dL

## 2016-07-23 MED ORDER — HEPARIN (PORCINE) IN NACL 100-0.45 UNIT/ML-% IJ SOLN
850.0000 [IU]/h | INTRAMUSCULAR | Status: AC
  Administered 2016-07-23 – 2016-07-24 (×2): 850 [IU]/h via INTRAVENOUS
  Filled 2016-07-23: qty 250

## 2016-07-23 NOTE — Progress Notes (Signed)
Pharmacy - IV heparin  Assessment:    Please see note from Peggyann Juba, PharmD earlier today for full details.  Briefly, 83 y.o. male on IV heparin for AFib   Heparin level therapeutic at 0.45 on 850 units/hr  Plan:   Continue heparin at 850 units/hr  Daily HL, CBC  Reuel Boom, PharmD, BCPS Pager: [phone] 07/23/2016, 8:34 PM

## 2016-07-23 NOTE — Progress Notes (Signed)
PT Cancellation Note  Patient Details Name: TIMOTY BOURKE MRN: [id number] DOB: 03/15/1930   Cancelled Treatment:     pt stated his back pain is "unbearable" despite having pain meds within past hour.  Also, noted on Heprin Drip.  Reported to [place] covering for RN .  Will attempt to see another day.  Pt has been evaluated with plans to D/C to SNF   Rica Koyanagi  PTA [facility]  Acute  Rehab Pager      [phone]

## 2016-07-23 NOTE — Progress Notes (Signed)
Patient ID: Eric Rivers, male   DOB: 1930-06-01, 83 y.o.   MRN: [id number]  PROGRESS NOTE    Eric Rivers  KTG:[id number] DOB: Sep 18, 1930 DOA: 07/19/2016 PCP: Biagio Borg, MD   Brief Narrative:  83 y.o.malewith medical history significant of DM-2, HTN, Afib on coumadin, recently diagnosed with L2 compression fracture-presented to the ED for evaluation of intractable back pain. He recently was seen in the ED on and 5/10 and 5/14 for similar problems. He had a mechanical fall earlier this month, with gradual worsening of his low back pain. A MRI LS Spine on 5/14 confirmed a L2 compression fracture, he was started on oral narcotics and other supportive measures, but continued to have pain. He presented back to the ED on 5/19-continued to have pain inspite of narcotics, benzo's, and was also found to have a UTI and coagulopathy. Patient was started on for spectrum antibiotics. Urine culture grew Citrobacter resistant to Rocephin; antibiotic was switched to cefepime on 07/22/2016. Patient is currently on heparin drip. INR is improved. Intervention radiology is following the patient regarding the need for kyphoplasty.   Assessment & Plan:   Principal Problem:   UTI (urinary tract infection) Active Problems:   Diabetes mellitus with neuropathy (HCC)   Chronic atrial fibrillation (HCC)   Hypertension   Hyperlipidemia   Chronic diastolic CHF (congestive heart failure) (HCC)   Warfarin-induced coagulopathy (HCC)   Closed compression fracture of L2 lumbar vertebra (HCC)   Intractable back pain  Intractable low back pain: likely due to L2 compression fracture. --Lumbar x ray on presentation: Moderate L2 compression fracture age indeterminate, unchanged from the recent CT and new since 06/11/2015. Mild spondylosis of the lumbar spine with disc disease at the L4-5 level. Grade 1 anterolisthesis of L4 on L5 due to the moderate facet arthropathy. -- Dr Sloan Leiter discussed the case with Dr.  Rosalee Kaufman on-call-who suggested that we continue with supportive measures, and if he continues to have pain consult interventional radiology for possible kyphoplasty.  -IR Consulted, patient is deemed to be a candidate for kyphoplasty once INR less than 1.6, and UTI adequated treated, likely Friday. Will follow up with IR regarding timing of the same. -apply TLSO brace  -Pain management, stool regimen - continue trial of  steroid taper - Discontinue fentanyl for bradycardia and hypotension -PT/OT eval  Coagulopathy:  --INR 5.3 on admission - Resolved. INR is 1.29 today -Coumadin held since admission, iv vitk on 5/21 goal of INR less than 1.6 for IR procedure; continue heparin drip per pharmacy -need to confirm with IR about resuming coumadin post procedure   Recurrent UTI:  -CT renal stone: no hydro, no renal stone, Bladder is underdistended. Stable high density material/calcification along the posterior aspect of the bladder (series 2/image 66) extending into a prostate TURP defect (series 2/image 71). -urine culture Citrobacter that is resistant to rocephin, change abx to cipro on 5/22 which was again held because of bradycardia. Continue cefepime for now. - CT scan finding was discussed with urology on call Dr Karsten Ro who recommended finish abx treatment and outpatient follow with Dr Tresa Moore within one week of discharge to consider cystoscopy and stone removal.  Chronic Atrial fibrillation: --currently rate controlled without the use of any rate control agent ( h/o bradycardia on betablocker) - Intermittent bradycardia --on coumadin and asa 325 at home, presented with supratherapeutic INR -- both held since admission, heparin drip per pharmacy for now  H/o diastolic chf: euvolemic , close monitor volume status. Resume Lasix  was blood pressure stabilizes.  noninsulin dependentType 2 diabetes: Hold oral agents januvia-place on SSI and follow CBGs. Monitor blood sugar   While on short course of steroids  Hypertension: follow BP trend. Amlodipine on hold for now  BPH: s/p TURP in 2014, Continue Flomax/proscar. Outpatient urology follow-up  Dyslipidemia: Continue statin  Gout:Continue allopurinol-no evidence of flare at this time.   CONSULTS: Neurosurgery Dr Ronnald Ramp by phone on admission IR Urology Dr Karsten Ro over the phone on 5/22  DVT Prophylaxis: Heparin drip  Code Status: DNR  Family Communication: None present at bedside  Disposition Plan: SNF vs ALF May need to discharge to snf then  return back to have kyphoplasty, depend on clinical course and IR schedule.   Procedures:  Plan for kyphoplasty, might be early next week, IR prefer uti fully treated prior to porcedure   Antibiotics:  Rocephin from admission to 5/22  cipro from 5/22- 5/22  Cefepime from 5/22>>     Subjective: Patient seen and examined at bedside. He is slightly awake but does not answer much questions. He denies any overnight fever, nausea, vomiting.  Objective: Vitals:   07/22/16 1407 07/22/16 1847 07/22/16 2137 07/23/16 0546  BP: (!) 97/56 (!) 116/57 121/60 120/69  Pulse: (!) 48 (!) 59 60 60  Resp: 12  20 18   Temp: 97.6 F (36.4 C)  97.5 F (36.4 C) 97.6 F (36.4 C)  TempSrc: Rectal  Oral Axillary  SpO2: 92%  99% 97%  Weight:      Height:        Intake/Output Summary (Last 24 hours) at 07/23/16 1333 Last data filed at 07/23/16 1243  Gross per 24 hour  Intake              700 ml  Output              475 ml  Net              225 ml   Filed Weights   07/19/16 2009 07/20/16 0826  Weight: 95.3 kg (210 lb) 89.3 kg (196 lb 13.9 oz)    Examination:  General exam: Appears calm and comfortable  Respiratory system: Bilateral decreased breath sound at bases With scattered crackles Cardiovascular system: S1 & S2 heard, intermittent bradycardia  Gastrointestinal system: Abdomen is nondistended, soft and nontender. Normal bowel sounds  heard. Extremities: No cyanosis, clubbing, trace pedal edema    Data Reviewed: I have personally reviewed following labs and imaging studies  CBC:  Recent Labs Lab 07/20/16 0505 07/21/16 0535  WBC 12.2* 10.4  NEUTROABS 9.7*  --   HGB 13.2 13.6  HCT 40.6 41.7  MCV 95.3 96.5  PLT 197 203   Basic Metabolic Panel:  Recent Labs Lab 07/20/16 0505 07/21/16 0535 07/22/16 0531 07/23/16 0019  NA 136 139 137 135  K 3.9 3.4* 3.6 4.4  CL 102 99* 99* 102  CO2 22 29 30 26   GLUCOSE 126* 114* 126* 171*  BUN 12 13 15 19   CREATININE 0.89 0.96 0.93 0.99  CALCIUM 8.6* 9.1 9.2 9.0  MG  --   --  1.7  --    GFR: Estimated Creatinine Clearance: 60.3 mL/min (by C-G formula based on SCr of 0.99 mg/dL). Liver Function Tests: No results for input(s): AST, ALT, ALKPHOS, BILITOT, PROT, ALBUMIN in the last 168 hours. No results for input(s): LIPASE, AMYLASE in the last 168 hours. No results for input(s): AMMONIA in the last 168 hours. Coagulation Profile:  Recent Labs Lab  07/20/16 0505 07/21/16 0535 07/22/16 0531 07/23/16 0019  INR 5.31* 4.93* 1.60 1.29   Cardiac Enzymes: No results for input(s): CKTOTAL, CKMB, CKMBINDEX, TROPONINI in the last 168 hours. BNP (last 3 results)  Recent Labs  09/07/15 1107  PROBNP 566.0*   HbA1C:  Recent Labs  07/22/16 0531  HGBA1C 5.9*   CBG:  Recent Labs Lab 07/22/16 1130 07/22/16 1631 07/22/16 2136 07/23/16 0733 07/23/16 1147  GLUCAP 187* 157* 147* 144* 143*   Lipid Profile: No results for input(s): CHOL, HDL, LDLCALC, TRIG, CHOLHDL, LDLDIRECT in the last 72 hours. Thyroid Function Tests: No results for input(s): TSH, T4TOTAL, FREET4, T3FREE, THYROIDAB in the last 72 hours. Anemia Panel: No results for input(s): VITAMINB12, FOLATE, FERRITIN, TIBC, IRON, RETICCTPCT in the last 72 hours. Sepsis Labs: No results for input(s): PROCALCITON, LATICACIDVEN in the last 168 hours.  Recent Results (from the past 240 hour(s))  Urine  culture     Status: None   Collection Time: 07/15/16 11:52 AM  Result Value Ref Range Status   Organism ID, Bacteria NO GROWTH  Final  Urine culture     Status: Abnormal   Collection Time: 07/19/16  8:24 PM  Result Value Ref Range Status   Specimen Description URINE, CLEAN CATCH  Final   Special Requests NONE  Final   Culture 20,000 COLONIES/mL CITROBACTER FREUNDII (A)  Final   Report Status 07/22/2016 FINAL  Final   Organism ID, Bacteria CITROBACTER FREUNDII (A)  Final      Susceptibility   Citrobacter freundii - MIC*    CEFAZOLIN >=64 RESISTANT Resistant     CEFEPIME <=1 SENSITIVE Sensitive     CEFTRIAXONE >=64 RESISTANT Resistant     CIPROFLOXACIN <=0.25 SENSITIVE Sensitive     GENTAMICIN <=1 SENSITIVE Sensitive     IMIPENEM <=0.25 SENSITIVE Sensitive     NITROFURANTOIN <=16 SENSITIVE Sensitive     TRIMETH/SULFA <=20 SENSITIVE Sensitive     PIP/TAZO 64 INTERMEDIATE Intermediate     * 20,000 COLONIES/mL CITROBACTER FREUNDII  Culture, blood (routine x 2)     Status: None (Preliminary result)   Collection Time: 07/22/16  4:43 PM  Result Value Ref Range Status   Specimen Description BLOOD LEFT ANTECUBITAL  Final   Special Requests   Final    BOTTLES DRAWN AEROBIC AND ANAEROBIC Blood Culture results may not be optimal due to an excessive volume of blood received in culture bottles   Culture   Final    NO GROWTH < 24 HOURS Performed at [facility], [place] [address] [place], [place] [address]    Report Status PENDING  Incomplete  Culture, blood (routine x 2)     Status: None (Preliminary result)   Collection Time: 07/22/16  4:43 PM  Result Value Ref Range Status   Specimen Description BLOOD LEFT HAND  Final   Special Requests IN PEDIATRIC BOTTLE Blood Culture adequate volume  Final   Culture   Final    NO GROWTH < 24 HOURS Performed at [facility], [place] [address], [place], [place] [address]    Report Status PENDING  Incomplete         Radiology Studies: No  results found.      Scheduled Meds: . acetaminophen  1,000 mg Oral Q8H  . allopurinol  300 mg Oral Daily  . atorvastatin  20 mg Oral q1800  . calcium-vitamin D  1 tablet Oral QHS  . fentaNYL  25 mcg Transdermal Q72H  . ferrous sulfate  325 mg Oral Q breakfast  . finasteride  5 mg Oral q morning - 10a  . gabapentin  100 mg Oral TID  . insulin aspart  0-9 Units Subcutaneous TID WC  . multivitamin with minerals  1 tablet Oral Daily  . pantoprazole  40 mg Oral Daily  . polyethylene glycol  17 g Oral Daily  . potassium chloride  10 mEq Oral Daily  . [START ON 07/26/2016] predniSONE  10 mg Oral Q breakfast  . [START ON 07/25/2016] predniSONE  20 mg Oral Q breakfast  . [START ON 07/24/2016] predniSONE  40 mg Oral Q breakfast  . senna-docusate  1 tablet Oral BID  . sodium chloride flush  3 mL Intravenous Q12H  . tamsulosin  0.4 mg Oral QHS  . traZODone  50 mg Oral QHS   Continuous Infusions: . sodium chloride    . ceFEPime (MAXIPIME) IV Stopped (07/23/16 1244)  . heparin 850 Units/hr (07/23/16 1130)     LOS: 3 days        Aline August, MD [facility] Pager [phone]  If 7PM-7AM, please contact night-coverage [email or web address] Password [email or web address] 07/23/2016, 1:33 PM

## 2016-07-23 NOTE — NC FL2 (Deleted)
[place] LEVEL OF CARE SCREENING TOOL     IDENTIFICATION  Patient Name: Eric Rivers Birthdate: 04-May-1930 Sex: male Admission Date (Current Location): 07/19/2016  [facility] and [place] Number:  Herbalist and Address:  [facility],  [place] [address], [place]      Provider Number: [id number]  Attending Physician Name and Address:  Aline August, MD  Relative Name and Phone Number:       Current Level of Care: Hospital Recommended Level of Care: [place] Prior Approval Number:    Date Approved/Denied:   PASRR Number: [id number]  Discharge Plan: SNF    Current Diagnoses: Patient Active Problem List   Diagnosis Date Noted  . UTI (urinary tract infection) 07/20/2016  . Intractable back pain 07/20/2016  . Closed compression fracture of L2 lumbar vertebra ([place]) 07/15/2016  . Radiculitis 07/15/2016  . Gait disorder 07/15/2016  . Acute UTI 07/07/2016  . Low back pain 07/04/2016  . Left-sided nosebleed 06/12/2016  . Epistaxis 04/11/2016  . Degenerative arthritis of right knee 09/20/2015  . Pronation deformity of both feet 09/20/2015  . Acute on chronic diastolic heart failure ([place]) 09/19/2015  . Dyspnea 09/07/2015  . Peripheral edema 09/07/2015  . Right knee pain 09/07/2015  . Hematuria 06/13/2015  . AKI (acute kidney injury) ([place]) 06/02/2015  . Elevated INR 06/01/2015  . Anemia 06/01/2015  . Hemorrhagic cystitis 06/01/2015  . Cough 04/20/2014  . Bradycardia 01/06/2014  . Diverticulosis of colon with hemorrhage 01/01/2014  . Perianal dermatitis 01/01/2014  . Chronic anemia 12/29/2013  . Lower GI bleed 12/29/2013  . Warfarin-induced coagulopathy ([place]) 12/29/2013  . Acute blood loss anemia 12/29/2013  . Chronic diastolic CHF (congestive heart failure) ([place]) 11/02/2013  . Syncope 07/19/2013  . Advanced care planning/counseling discussion 04/12/2013  . Encounter for therapeutic drug monitoring  04/12/2013  . Insomnia 04/11/2013  . Mitral regurgitation   . Ascending aortic dissection ([place]) 04/16/2012  . Malignant neoplasm of prostate ([place]) 10/27/2011  . Warfarin anticoagulation   . Hypertension   . GERD (gastroesophageal reflux disease)   . Aortic stenosis   . Pulmonary hypertension ([place])   . Hyperlipidemia   . Ejection fraction   . Normal nuclear stress test   . Routine general medical examination at a health care facility 08/22/2010  . Bladder cancer ([place])   . Gout   . Chronic atrial fibrillation ([place])   . HYPERTROPHY PROSTATE W/O UR OBST & OTH LUTS 09/07/2007  . DEGENERATIVE JOINT DISEASE, GENERALIZED 09/07/2007  . NEOP, MALIGNANT, BLADDER NEC 12/04/2006  . Diabetes mellitus with neuropathy ([place]) 12/04/2006    Orientation RESPIRATION BLADDER Height & Weight     Self, Time, Situation, Place  Normal External catheter Weight: 196 lb 13.9 oz (89.3 kg) Height:  5\' 8"  (172.7 cm)  BEHAVIORAL SYMPTOMS/MOOD NEUROLOGICAL BOWEL NUTRITION STATUS        Diet (heart healthy/carb modified )  AMBULATORY STATUS COMMUNICATION OF NEEDS Skin   Extensive Assist Verbally Normal                       Personal Care Assistance Level of Assistance  Bathing, Feeding, Dressing Bathing Assistance: Limited assistance Feeding assistance: Independent Dressing Assistance: Limited assistance     Functional Limitations Info  Sight Sight Info: Impaired        SPECIAL CARE FACTORS FREQUENCY  PT (By licensed PT), OT (By licensed OT)     PT Frequency: 5x/week OT Frequency: 5x/week  Contractures Contractures Info: Not present    Additional Factors Info  Code Status, Allergies Code Status Info: DNR Allergies Info: Allergies:  Metoprolol           Current Medications (07/23/2016):  This is the current hospital active medication list Current Facility-Administered Medications  Medication Dose Route Frequency Provider Last Rate Last Dose  . 0.9 %  sodium chloride  infusion  250 mL Intravenous PRN Jonetta Osgood, MD      . acetaminophen (TYLENOL) tablet 1,000 mg  1,000 mg Oral Q8H Ghimire, Henreitta Leber, MD   1,000 mg at 07/23/16 1321  . albuterol (PROVENTIL) (2.5 MG/3ML) 0.083% nebulizer solution 2.5 mg  2.5 mg Nebulization Q2H PRN Ghimire, Henreitta Leber, MD      . allopurinol (ZYLOPRIM) tablet 300 mg  300 mg Oral Daily Ghimire, Shanker M, MD   300 mg at 07/23/16 1216  . atorvastatin (LIPITOR) tablet 20 mg  20 mg Oral q1800 Jonetta Osgood, MD   20 mg at 07/22/16 1749  . calcium-vitamin D (OSCAL WITH D) 500-200 MG-UNIT per tablet 1 tablet  1 tablet Oral QHS Jonetta Osgood, MD   1 tablet at 07/22/16 2203  . ceFEPIme (MAXIPIME) 2 g in dextrose 5 % 50 mL IVPB  2 g Intravenous Q12H Florencia Reasons, MD   Stopped at 07/23/16 1244  . ferrous sulfate tablet 325 mg  325 mg Oral Q breakfast Florencia Reasons, MD   325 mg at 07/23/16 0960  . finasteride (PROSCAR) tablet 5 mg  5 mg Oral q morning - 10a Ghimire, Henreitta Leber, MD   5 mg at 07/23/16 1216  . gabapentin (NEURONTIN) capsule 100 mg  100 mg Oral TID Jonetta Osgood, MD   100 mg at 07/23/16 1216  . heparin ADULT infusion 100 units/mL (25000 units/291mL sodium chloride 0.45%)  850 Units/hr Intravenous Continuous Emiliano Dyer, RPH 8.5 mL/hr at 07/23/16 1130 850 Units/hr at 07/23/16 1130  . insulin aspart (novoLOG) injection 0-9 Units  0-9 Units Subcutaneous TID WC Jonetta Osgood, MD   1 Units at 07/23/16 1216  . morphine 4 MG/ML injection 1 mg  1 mg Intravenous Q4H PRN Jonetta Osgood, MD   1 mg at 07/23/16 1450  . multivitamin with minerals tablet 1 tablet  1 tablet Oral Daily Jonetta Osgood, MD   1 tablet at 07/23/16 1215  . ondansetron (ZOFRAN) tablet 4 mg  4 mg Oral Q6H PRN Ghimire, Henreitta Leber, MD       Or  . ondansetron (ZOFRAN) injection 4 mg  4 mg Intravenous Q6H PRN Ghimire, Henreitta Leber, MD      . oxyCODONE (Oxy IR/ROXICODONE) immediate release tablet 5 mg  5 mg Oral Q4H PRN Jonetta Osgood, MD   5 mg  at 07/23/16 1321  . pantoprazole (PROTONIX) EC tablet 40 mg  40 mg Oral Daily Jonetta Osgood, MD   40 mg at 07/23/16 1216  . polyethylene glycol (MIRALAX / GLYCOLAX) packet 17 g  17 g Oral Daily Florencia Reasons, MD   17 g at 07/23/16 1215  . potassium chloride (K-DUR,KLOR-CON) CR tablet 10 mEq  10 mEq Oral Daily Jonetta Osgood, MD   10 mEq at 07/23/16 1216  . [START ON 07/26/2016] predniSONE (DELTASONE) tablet 10 mg  10 mg Oral Q breakfast Florencia Reasons, MD      . Derrill Memo ON 07/25/2016] predniSONE (DELTASONE) tablet 20 mg  20 mg Oral Q breakfast Florencia Reasons, MD      . [  START ON 07/24/2016] predniSONE (DELTASONE) tablet 40 mg  40 mg Oral Q breakfast Florencia Reasons, MD      . senna-docusate (Senokot-S) tablet 1 tablet  1 tablet Oral BID Florencia Reasons, MD   1 tablet at 07/23/16 1215  . sodium chloride flush (NS) 0.9 % injection 3 mL  3 mL Intravenous Q12H Jonetta Osgood, MD   3 mL at 07/22/16 2204  . sodium chloride flush (NS) 0.9 % injection 3 mL  3 mL Intravenous PRN Jonetta Osgood, MD      . tamsulosin (FLOMAX) capsule 0.4 mg  0.4 mg Oral QHS Jonetta Osgood, MD   0.4 mg at 07/22/16 2204  . tiZANidine (ZANAFLEX) tablet 4 mg  4 mg Oral Q6H PRN Florencia Reasons, MD   4 mg at 07/23/16 0355  . traZODone (DESYREL) tablet 50 mg  50 mg Oral QHS Jonetta Osgood, MD   50 mg at 07/22/16 2204     Discharge Medications: Please see discharge summary for a list of discharge medications.  Relevant Imaging Results:  Relevant Lab Results:   Additional Information SSN [id number]  Burnis Medin, LCSW

## 2016-07-23 NOTE — Progress Notes (Signed)
Chief Complaint: Patient was seen in consultation today for  Chief Complaint  Patient presents with  . Back Pain   at the request of Dr. Florencia Reasons  Referring Physician(s): Dr. Florencia Reasons  Supervising Physician: Sandi Mariscal  Patient Status: [facility] - In-pt  History of Present Illness: Eric Rivers is a 83 y.o. male with medical history significant for DM, Afib, normally on Coumadin. He has been admitted with intractable back pain and found to have L2 compression fracture. He has continued to have significant pain, up to 9-10/10 when he does not have medication. When he is at rest in ebd, he still reports pain of at least 5/10. IR was consulted for KP procedure. Chart and imaging was reviewed and pt determined to be a candidate for KP. His Coumadin has been stopped and he has been placed on heparin drip. Last INR was 1.23 He had a (+)UA and culture grew out Citrobacter. His IV abx have been adjusted. He has some kind of chronic stone or sludge lodged in his bladder and may have chronic colonization. He has been afebrile. We discussed KP procedure with him a few days ago and given that he is still having this much pain, he would like to move forward with the procedure.   Past Medical History:  Diagnosis Date  . ACE-inhibitor cough   . Aortic dissection [facility])    Surgical repair February, 2014  . Aortic stenosis    mild....echo... september... 2010/ mild... echo.[name] KitchenMarland KitchenDec, 2011  . Atrial fibrillation (HCC)     Chronic,   24 hour holter, September, 2011.... atrial fib rate is controlled.... there is some bradycardia but  no marked pauses  . Bladder cancer [facility])    recurrence with TUR-B March '09  . DDD (degenerative disc disease)    lumbar spine  . Diabetes mellitus    type 2  . Diabetes mellitus with neuropathy ([place]) 12/04/2006   Qualifier: Diagnosis of  By: Linda Hedges MD, Heinz Knuckles  medications - DPP4   . Diverticulosis of colon with hemorrhage 01/01/2014  . Ejection fraction    EF  60%, echo, 01/2010  . Fluid overload   . Fracture of metacarpal bone 11/30/11  . GERD (gastroesophageal reflux disease)   . Gout   . History of echocardiogram    Echo 5/17 - mild LVH, EF 55-60%, mod AS (mean 16 mmHg, peak 29 mmHg), MAC, midl MR, massive BAE, mod dilated RV, low normal RVSF, mod TR, PASP 64 mmHg  . Hx of colonoscopy    approx. 10 years  . Hypercholesterolemia   . Hyperlipidemia   . Hypertension   . Mitral regurgitation   . Normal nuclear stress test    06/2005 , also ABI normal 2007  . Prostate cancer ([place])    radiation tx.  . Pulmonary hypertension ([place])    3mHg, echo, 01/2010  . Shortness of breath    on exertion  . Warfarin anticoagulation    Atrial fib  . Whooping cough     Past Surgical History:  Procedure Laterality Date  . ASCENDING AORTIC ROOT REPLACEMENT N/A 04/15/2012   Procedure: ASCENDING AORTIC ROOT REPLACEMENT;  Surgeon: BGaye Pollack MD;  [facility]  Service: Open Heart Surgery;  Laterality: N/A;  . bladder cancer biopsy  10/16/2010   negative for malignancy  . bladder microscopic  04/13/2007   high grade papillary urothelial lesions  . COLONOSCOPY N/A 01/01/2014   Procedure: COLONOSCOPY;  Surgeon: CGatha Mayer MD;  Location: [facility];  Service: Endoscopy;  Laterality: N/A;  . CYSTOSCOPY/RETROGRADE/URETEROSCOPY Bilateral 04/04/2013   Procedure: CYSTOSCOPY WITH RETROGRADE PYELOGRAM AND BLADDER BIOPSY;  Surgeon: Molli Hazard, MD;  Location: [facility];  Service: Urology;  Laterality: Bilateral;  . 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 EXPLORATION POST OPERATIVE OPEN HEART  04/2012  . Herniatic disc surgery    . LUMBAR LAMINECTOMY     '02  . PROSTATE BIOPSY  09/25/2011   GLEASON 3+3=6 AND 4+3=7  . TEE WITHOUT CARDIOVERSION N/A 04/15/2012   Procedure: TRANSESOPHAGEAL ECHOCARDIOGRAM (TEE);  Surgeon: Gaye Pollack, MD;  Location: [place];  Service: Open Heart Surgery;  Laterality: N/A;  .  TRANSURETHRAL RESECTION OF BLADDER TUMOR N/A 06/13/2015   Procedure: TRANSURETHRAL RESECTION OF PROSTATE (TURP) CLOT EVACUATION AND FULGERATION, BLADDER STONE REMOVAL ;  Surgeon: Alexis Frock, MD;  Location: [facility];  Service: Urology;  Laterality: N/A;  . TUR-BT '06, '09      Allergies: Metoprolol  Medications:  Current Facility-Administered Medications:  .  0.9 %  sodium chloride infusion, 250 mL, Intravenous, PRN, Ghimire, Henreitta Leber, MD .  acetaminophen (TYLENOL) tablet 1,000 mg, 1,000 mg, Oral, Q8H, Ghimire, Henreitta Leber, MD, 1,000 mg at 07/23/16 1321 .  albuterol (PROVENTIL) (2.5 MG/3ML) 0.083% nebulizer solution 2.5 mg, 2.5 mg, Nebulization, Q2H PRN, Ghimire, Henreitta Leber, MD .  allopurinol (ZYLOPRIM) tablet 300 mg, 300 mg, Oral, Daily, Ghimire, Shanker M, MD, 300 mg at 07/23/16 1216 .  atorvastatin (LIPITOR) tablet 20 mg, 20 mg, Oral, q1800, Ghimire, Henreitta Leber, MD, 20 mg at 07/22/16 1749 .  calcium-vitamin D (OSCAL WITH D) 500-200 MG-UNIT per tablet 1 tablet, 1 tablet, Oral, QHS, Ghimire, Henreitta Leber, MD, 1 tablet at 07/22/16 2203 .  ceFEPIme (MAXIPIME) 2 g in dextrose 5 % 50 mL IVPB, 2 g, Intravenous, Q12H, Florencia Reasons, MD, Stopped at 07/23/16 1244 .  ferrous sulfate tablet 325 mg, 325 mg, Oral, Q breakfast, Florencia Reasons, MD, 325 mg at 07/23/16 2703 .  finasteride (PROSCAR) tablet 5 mg, 5 mg, Oral, q morning - 10a, Ghimire, Henreitta Leber, MD, 5 mg at 07/23/16 1216 .  gabapentin (NEURONTIN) capsule 100 mg, 100 mg, Oral, TID, Ghimire, Henreitta Leber, MD, 100 mg at 07/23/16 1216 .  heparin ADULT infusion 100 units/mL (25000 units/244m sodium chloride 0.45%), 850 Units/hr, Intravenous, Continuous, WEmiliano Dyer RPH, Last Rate: 8.5 mL/hr at 07/23/16 1130, 850 Units/hr at 07/23/16 1130 .  insulin aspart (novoLOG) injection 0-9 Units, 0-9 Units, Subcutaneous, TID WC, Ghimire, SHenreitta Leber MD, 1 Units at 07/23/16 1216 .  morphine 4 MG/ML injection 1 mg, 1 mg, Intravenous, Q4H PRN, GJonetta Osgood MD, 1 mg at  07/23/16 1450 .  multivitamin with minerals tablet 1 tablet, 1 tablet, Oral, Daily, Ghimire, SHenreitta Leber MD, 1 tablet at 07/23/16 1215 .  ondansetron (ZOFRAN) tablet 4 mg, 4 mg, Oral, Q6H PRN **OR** ondansetron (ZOFRAN) injection 4 mg, 4 mg, Intravenous, Q6H PRN, Ghimire, SHenreitta Leber MD .  oxyCODONE (Oxy IR/ROXICODONE) immediate release tablet 5 mg, 5 mg, Oral, Q4H PRN, GJonetta Osgood MD, 5 mg at 07/23/16 1321 .  pantoprazole (PROTONIX) EC tablet 40 mg, 40 mg, Oral, Daily, Ghimire, SHenreitta Leber MD, 40 mg at 07/23/16 1216 .  polyethylene glycol (MIRALAX / GLYCOLAX) packet 17 g, 17 g, Oral, Daily, XFlorencia Reasons MD, 17 g at 07/23/16 1215 .  potassium chloride (K-DUR,KLOR-CON) CR tablet 10 mEq, 10 mEq, Oral, Daily, Ghimire, SHenreitta Leber MD,  10 mEq at 07/23/16 1216 .  [START ON 07/26/2016] predniSONE (DELTASONE) tablet 10 mg, 10 mg, Oral, Q breakfast, Florencia Reasons, MD .  Derrill Memo ON 07/25/2016] predniSONE (DELTASONE) tablet 20 mg, 20 mg, Oral, Q breakfast, Florencia Reasons, MD .  Derrill Memo ON 07/24/2016] predniSONE (DELTASONE) tablet 40 mg, 40 mg, Oral, Q breakfast, Florencia Reasons, MD .  senna-docusate (Senokot-S) tablet 1 tablet, 1 tablet, Oral, BID, Florencia Reasons, MD, 1 tablet at 07/23/16 1215 .  sodium chloride flush (NS) 0.9 % injection 3 mL, 3 mL, Intravenous, Q12H, Ghimire, Henreitta Leber, MD, 3 mL at 07/22/16 2204 .  sodium chloride flush (NS) 0.9 % injection 3 mL, 3 mL, Intravenous, PRN, Ghimire, Henreitta Leber, MD .  tamsulosin (FLOMAX) capsule 0.4 mg, 0.4 mg, Oral, QHS, Ghimire, Henreitta Leber, MD, 0.4 mg at 07/22/16 2204 .  tiZANidine (ZANAFLEX) tablet 4 mg, 4 mg, Oral, Q6H PRN, Florencia Reasons, MD, 4 mg at 07/23/16 0355 .  traZODone (DESYREL) tablet 50 mg, 50 mg, Oral, QHS, Ghimire, Henreitta Leber, MD, 50 mg at 07/22/16 2204    Family History  Problem Relation Age of Onset  . Prostate cancer Father 52       passed with prostate ca  . Cancer Father   . Stroke Father   . Heart disease Mother   . Hypertension Mother   . Heart attack Mother      Social History   Social History  . Marital status: Single    Spouse name: N/A  . Number of children: 0  . Years of education: 97   Occupational History  . history and Advice worker Retired    retired   Social History Main Topics  . Smoking status: Former Smoker    Packs/day: 1.00    Types: Cigarettes    Quit date: 03/04/1975  . Smokeless tobacco: Never Used  . Alcohol use No  . Drug use: No  . Sexual activity: Not Currently   Other Topics Concern  . None   Social History Narrative   Confirmed batchelor. Retired Education officer, museum. World traveler- Ambulance person. I- ADLS. End of life Care   No CPR, no prolonged intubation, no prolonged artificial hydration or feeding, no heroic or futile measures.       Next [place] Sept '13 [place]      Last cruise - two cruises back to back to the [place] in January '15. [place]'15: [place] with stops in [place], [place], [place], [place], [place]. 15 days      Fall '15: 50 Days from [place]. [place], [place], [place], [place], [place], [place], [place].       May '15 - 15 days up the [employer] as far as [place]              Review of Systems: A 12 point ROS discussed and pertinent positives are indicated in the HPI above.  All other systems are negative.  Review of Systems  Vital Signs: BP (!) 144/69 (BP Location: Left Arm)   Pulse (!) 57   Temp 97.7 F (36.5 C) (Axillary)   Resp 17   Ht _0  (1.727 m)   Wt 196 lb 13.9 oz (89.3 kg)   SpO2 99%   BMI 29.93 kg/m   Physical Exam  Constitutional: He is oriented to person, place, and time. He appears well-developed. No distress.  HENT:  Head: Normocephalic.  Mouth/Throat: Oropharynx is clear  and moist.  Neck: Normal range of motion. No JVD present. No tracheal deviation present.  Cardiovascular: Normal rate and normal heart sounds.   Irregular rhythm  Pulmonary/Chest:  Effort normal and breath sounds normal. No respiratory distress.  Abdominal: Soft. There is no tenderness. There is no guarding.  Musculoskeletal: He exhibits tenderness. He exhibits no edema.  Tender mid lumbar region  Neurological: He is alert and oriented to person, place, and time. He displays normal reflexes. No cranial nerve deficit. He exhibits normal muscle tone.  Skin: Skin is warm and dry.  Psychiatric: He has a normal mood and affect.    Mallampati Score:  MD Evaluation Airway: WNL Heart: WNL Abdomen: WNL Chest/ Lungs: WNL ASA  Classification: 3 Mallampati/Airway Score: Two  Imaging: Ct Abdomen Pelvis Wo Contrast  Result Date: 07/10/2016 CLINICAL DATA:  Low back and bilateral hip pain.  Recent fall. EXAM: CT ABDOMEN AND PELVIS WITHOUT CONTRAST TECHNIQUE: Multidetector CT imaging of the abdomen and pelvis was performed following the standard protocol without IV contrast. COMPARISON:  06/11/2015 FINDINGS: Lower chest:  Subsegmental atelectasis in the lower lobes. Hepatobiliary: Nodular liver contour suggests cirrhosis. No focal abnormality in the liver on this study without intravenous contrast. Multiple calcified gallstones identified measuring around 5 mm each. No intrahepatic or extrahepatic biliary dilation. Pancreas: No focal mass lesion. No dilatation of the main duct. No intraparenchymal cyst. No peripancreatic edema. Spleen: No splenomegaly.  Tiny subcapsular cyst again noted. Adrenals/Urinary Tract: No adrenal nodule or mass. 19 mm cystic lesion upper pole right kidney incompletely characterized on today's study but similar in appearance to 06/11/2015. Left kidney unremarkable. No evidence for hydroureter. Dystrophic calcification is identified in the posterior bladder wall extending to the central prostate gland, potentially related to prior TURBT. Stomach/Bowel: Stomach is nondistended. No gastric wall thickening. No evidence of outlet obstruction. Duodenum is normally  positioned as is the ligament of Treitz. No small bowel wall thickening. No small bowel dilatation. The terminal ileum is normal. The appendix is normal. No gross colonic mass. No colonic wall thickening. No substantial diverticular change. Vascular/Lymphatic: Chronic thoracoabdominal aortic dissection again noted, incompletely visualized. There is no gastrohepatic or hepatoduodenal ligament lymphadenopathy. No intraperitoneal or retroperitoneal lymphadenopathy. No pelvic sidewall lymphadenopathy. Reproductive: Brachytherapy seeds noted prostate gland. Other: No intraperitoneal free fluid. Musculoskeletal: Small groin hernias contain only fat. Bone windows reveal no worrisome lytic or sclerotic osseous lesions. Compression deformity at the L2 level is age indeterminate on today's study but new since 06/11/2015. IMPRESSION: 1. No acute findings in the abdomen or pelvis. 2. Interval development of a large dystrophic calcification associated with the posterior bladder wall and central prostate, potentially related to the urethra. 3. Chronic thoracoabdominal aortic dissection. 4. Age-indeterminate L2 compression deformity is new since 06/11/2015. 5. Cholelithiasis. 6. Nodular liver contour suggests cirrhosis. Electronically Signed   By: Misty Stanley M.D.   On: 07/10/2016 16:39   Dg Lumbar Spine Complete  Result Date: 07/19/2016 CLINICAL DATA:  Severe low back pain chronically. EXAM: LUMBAR SPINE - COMPLETE 4+ VIEW COMPARISON:  CT 07/10/2016 and 06/11/2015 FINDINGS: Diffuse osteopenia. Mild spondylosis throughout the lumbar spine. Subtle grade 1 anterolisthesis of L4 on L5 likely due to the moderate facet arthropathy. Disc space narrowing at the L4-5 level. Moderate L2 compression fracture unchanged from recent CT and new compared to 06/11/2015. Calcified plaque over the abdominal aorta. IMPRESSION: Moderate L2 compression fracture age indeterminate, unchanged from the recent CT and new since 06/11/2015. Mild  spondylosis of the lumbar spine with disc  disease at the L4-5 level. Grade 1 anterolisthesis of L4 on L5 due to the moderate facet arthropathy. Electronically Signed   By: Marin Olp M.D.   On: 07/19/2016 21:04   Mr Thoracic Spine Wo Contrast  Result Date: 07/14/2016 CLINICAL DATA:  Progressive thoracic and lumbar pain. EXAM: MRI THORACIC AND LUMBAR SPINE WITHOUT CONTRAST TECHNIQUE: Multiplanar and multiecho pulse sequences of the thoracic and lumbar spine were obtained without intravenous contrast. COMPARISON:  CT scan of the abdomen and pelvis dated 07/10/2016 and CT scan of the chest dated 11/21/2015 FINDINGS: MRI THORACIC SPINE FINDINGS Alignment:  Physiologic. Vertebrae: No fracture, evidence of discitis, or bone lesion. Cord:  Normal signal and morphology. Paraspinal and other soft tissues: Aneurysmal dilatation of the thoracic aorta including the ascending and descending portions with a chronic type B dissection of the descending thoracic aorta extending into the abdominal aorta. Disc levels: Diffuse desiccation of the discs. No significant disc bulging. No disc protrusions or neural impingement. No foraminal or spinal stenosis or significant facet joint disease. MRI LUMBAR SPINE FINDINGS Segmentation:  Standard. Alignment:  Physiologic. Vertebrae: Benign-appearing mild compression fracture of L2. Minimal protrusion of the posterior margin of L2 into the spinal canal without neural impingement. Conus medullaris: Extends to the L1-2 level and appears normal. Paraspinal and other soft tissues: Chronic dissection of the abdominal aorta extending into the left common iliac artery, appearing unchanged since the prior CT scan. Cholelithiasis. Disc levels: L1-2:  Tiny broad-based disc bulge with no neural impingement. L2-3:  Tiny disc bulge into the left neural foramen. L3-4: Tiny broad-based disc bulge asymmetric to the left without neural impingement. L4-5: Disc space narrowing. Small broad-based disc  osteophyte complex without neural impingement, extending into both neural foramina. This could affect the right L4 nerve. L5-S1: Tiny disc bulge asymmetric to the left with no neural impingement. Small Tarlov cyst at S2. IMPRESSION: MR THORACIC SPINE IMPRESSION No significant abnormalities of the thoracic spine. Chronic thoracolumbar aortic dissection and aneurysmal dilatation of the ascending thoracic aorta. MR LUMBAR SPINE IMPRESSION Benign appearing acute or subacute compression fracture of L2 without neural impingement. Degenerative disc disease primarily at L4-5 with moderate right foraminal stenosis which could affect the L4 nerve. Electronically Signed   By: Lorriane Shire M.D.   On: 07/14/2016 08:51   Mr Lumbar Spine Wo Contrast  Result Date: 07/14/2016 CLINICAL DATA:  Progressive thoracic and lumbar pain. EXAM: MRI THORACIC AND LUMBAR SPINE WITHOUT CONTRAST TECHNIQUE: Multiplanar and multiecho pulse sequences of the thoracic and lumbar spine were obtained without intravenous contrast. COMPARISON:  CT scan of the abdomen and pelvis dated 07/10/2016 and CT scan of the chest dated 11/21/2015 FINDINGS: MRI THORACIC SPINE FINDINGS Alignment:  Physiologic. Vertebrae: No fracture, evidence of discitis, or bone lesion. Cord:  Normal signal and morphology. Paraspinal and other soft tissues: Aneurysmal dilatation of the thoracic aorta including the ascending and descending portions with a chronic type B dissection of the descending thoracic aorta extending into the abdominal aorta. Disc levels: Diffuse desiccation of the discs. No significant disc bulging. No disc protrusions or neural impingement. No foraminal or spinal stenosis or significant facet joint disease. MRI LUMBAR SPINE FINDINGS Segmentation:  Standard. Alignment:  Physiologic. Vertebrae: Benign-appearing mild compression fracture of L2. Minimal protrusion of the posterior margin of L2 into the spinal canal without neural impingement. Conus  medullaris: Extends to the L1-2 level and appears normal. Paraspinal and other soft tissues: Chronic dissection of the abdominal aorta extending into the left common iliac artery,  appearing unchanged since the prior CT scan. Cholelithiasis. Disc levels: L1-2:  Tiny broad-based disc bulge with no neural impingement. L2-3:  Tiny disc bulge into the left neural foramen. L3-4: Tiny broad-based disc bulge asymmetric to the left without neural impingement. L4-5: Disc space narrowing. Small broad-based disc osteophyte complex without neural impingement, extending into both neural foramina. This could affect the right L4 nerve. L5-S1: Tiny disc bulge asymmetric to the left with no neural impingement. Small Tarlov cyst at S2. IMPRESSION: MR THORACIC SPINE IMPRESSION No significant abnormalities of the thoracic spine. Chronic thoracolumbar aortic dissection and aneurysmal dilatation of the ascending thoracic aorta. MR LUMBAR SPINE IMPRESSION Benign appearing acute or subacute compression fracture of L2 without neural impingement. Degenerative disc disease primarily at L4-5 with moderate right foraminal stenosis which could affect the L4 nerve. Electronically Signed   By: Lorriane Shire M.D.   On: 07/14/2016 08:51   Ct Renal Stone Study  Result Date: 07/19/2016 CLINICAL DATA:  Flank pain, possible UTI EXAM: CT ABDOMEN AND PELVIS WITHOUT CONTRAST TECHNIQUE: Multidetector CT imaging of the abdomen and pelvis was performed following the standard protocol without IV contrast. COMPARISON:  07/10/2016 FINDINGS: Lower chest: Small right pleural effusion. Hepatobiliary: Liver is grossly unremarkable. Layering small gallstones (series 2/ image 76). No associated inflammatory changes. Pancreas: Within normal limits. Spleen: Within normal limits. Adrenals/Urinary Tract: Adrenal glands are within normal limits. Kidneys are within normal limits. No renal or ureteral calculi. No hydronephrosis. Bladder is underdistended. Stable high  density material/ calcification along the posterior aspect of the bladder (series 2/ image 66) extending into a prostate TURP defect (series 2/image 71). Stomach/Bowel: Stomach is within normal limits. No evidence of bowel obstruction. Normal appendix (series 2/ image 54). Vascular/Lymphatic: Known thoracoabdominal aortic dissection is poorly evaluated on unenhanced CT, although a displaced intimal calcification is present. 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 Atherosclerotic calcifications of the abdominal aorta and branch vessels. No suspicious abdominopelvic lymphadenopathy. Reproductive: Brachytherapy seeds in the prostate. Postsurgical changes related to TURP defect with calcifications, as described above. Other: No abdominopelvic ascites. Tiny fat containing periumbilical hernia. Musculoskeletal: Moderate compression fracture deformity at L2 (sagittal image 108), with 50% loss of height, mildly progressed from recent CT. Minimal retropulsion of 1-2 mm. IMPRESSION: Moderate compression fracture deformity at L2, with 50% loss of height, mildly progressed from recent CT. Minimal retropulsion of 1-2 mm. Postsurgical changes related to TURP. Dystrophic calcification along the posterior bladder and extending into the TURP defect. Cholelithiasis, without associated inflammatory changes. Small right pleural effusion. 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 No findings suspicious for metastatic disease. Electronically Signed   By: Julian Hy M.D.   On: 07/19/2016 22:31    Labs:  CBC:  Recent Labs  07/10/16 1546 07/14/16 0201 07/20/16 0505 07/21/16 0535  WBC 15.9* 17.6* 12.2* 10.4  HGB 14.3 13.6 13.2 13.6  HCT 41.7 41.2 40.6 41.7  PLT 252 249 197 196    COAGS:  Recent Labs  07/20/16 0505 07/21/16 0535 07/22/16 0531 07/23/16 0019  INR 5.31* 4.93* 1.60 1.29    BMP:  Recent Labs  07/20/16 0505 07/21/16 0535 07/22/16 0531  07/23/16 0019  NA 136 139 137 135  K 3.9 3.4* 3.6 4.4  CL 102 99* 99* 102  CO2 _0 GLUCOSE 126* 114* 126* 171*  BUN _1 CALCIUM 8.6* 9.1 9.2 9.0  CREATININE 0.89 0.96 0.93 0.99  GFRNONAA >60 >60 >60 >60  GFRAA >60 >60 >60 >60    LIVER FUNCTION TESTS:  Recent Labs  12/10/15 1505 04/07/16 1311 07/07/16 1725 07/10/16 1546  BILITOT 0.81 1.49* 0.9 1.8*  AST _0 38  ALT _1 ALKPHOS 97 96 80 81  PROT 7.3 8.0 7.2 7.9  ALBUMIN 3.8 4.2 4.4 4.4    TUMOR MARKERS: No results for input(s): AFPTM, CEA, CA199, CHROMGRNA in the last 8760 hours.  Assessment and Plan: Symptomatic L2 compression fracture Candidate for Kyphoplasty, procedure explained, pt agreeable Coumadin reversed, on heparin drip. UTI, may be chronic colonization, but will cont to treat with IV abx. Tentative plan for KP on Friday 5/25 with Dr. Pascal Lux Risks and Benefits discussed with the patient including, but not limited to education regarding the natural healing process of compression fractures without intervention, bleeding, infection, cement migration which may cause spinal cord damage, paralysis, pulmonary embolism or even death. All of the patient's questions were answered, patient is agreeable to proceed. Consent signed and in chart.    Thank you for this interesting consult.  I greatly enjoyed meeting Eric Rivers and look forward to participating in their care.  A copy of this report was sent to the requesting provider on this date.  Electronically Signed: Ascencion Dike, PA-C 07/23/2016, 3:23 PM   I spent a total of 30 minutes in face to face in clinical consultation, greater than 50% of which was counseling/coordinating care for symptomatic L2 fracture

## 2016-07-23 NOTE — Progress Notes (Signed)
[place] for Heparin Indication: atrial fibrillation  Allergies  Allergen Reactions  . Metoprolol Other (See Comments)    Reaction:  Bradycardia with 55m BID dose    Patient Measurements: Height: _0  (172.7 cm) Weight: 196 lb 13.9 oz (89.3 kg) IBW/kg (Calculated) : 68.4 Heparin Dosing Weight: 87 kg  Vital Signs: Temp: 97.6 F (36.4 C) (05/23 0546) Temp Source: Axillary (05/23 0546) BP: 120/69 (05/23 0546) Pulse Rate: 60 (05/23 0546)  Labs:  Recent Labs  07/21/16 0535 07/22/16 0531 07/22/16 1552 07/23/16 0019 07/23/16 0855  HGB 13.6  --   --   --   --   HCT 41.7  --   --   --   --   PLT 196  --   --   --   --   LABPROT 47.3* 19.2*  --  16.2*  --   INR 4.93* 1.60  --  1.29  --   HEPARINUNFRC  --   --  0.41 0.55 0.78*  CREATININE 0.96 0.93  --  0.99  --     Estimated Creatinine Clearance: 60.3 mL/min (by C-G formula based on SCr of 0.99 mg/dL).   Medical History: Past Medical History:  Diagnosis Date  . ACE-inhibitor cough   . Aortic dissection ([facility]    Surgical repair February, 2014  . Aortic stenosis    mild....echo... september... 2010/ mild... echo..[name] KitchenMarland [place], 2011  . Atrial fibrillation (HCC)     Chronic,   24 hour holter, September, 2011.... atrial fib rate is controlled.... there is some bradycardia but  no marked pauses  . Bladder cancer ([facility]    recurrence with TUR-B March '09  . DDD (degenerative disc disease)    lumbar spine  . Diabetes mellitus    type 2  . Diabetes mellitus with neuropathy ([facility] 12/04/2006   Qualifier: Diagnosis of  By: NLinda HedgesMD, MHeinz Knuckles medications - DPP4   . Diverticulosis of colon with hemorrhage 01/01/2014  . Ejection fraction    EF 60%, echo, 01/2010  . Fluid overload   . Fracture of metacarpal bone 11/30/11  . GERD (gastroesophageal reflux disease)   . Gout   . History of echocardiogram    Echo 5/17 - mild LVH, EF 55-60%, mod AS (mean 16 mmHg, peak 29 mmHg), MAC, midl MR,  massive BAE, mod dilated RV, low normal RVSF, mod TR, PASP 64 mmHg  . Hx of colonoscopy    approx. 10 years  . Hypercholesterolemia   . Hyperlipidemia   . Hypertension   . Mitral regurgitation   . Normal nuclear stress test    06/2005 , also ABI normal 2007  . Prostate cancer ([facility]    radiation tx.  . Pulmonary hypertension ([facility]    429mg, echo, 01/2010  . Shortness of breath    on exertion  . Warfarin anticoagulation    Atrial fib  . Whooping cough      Assessment:  [age]r male admitted on 5/20 with low back pain (xray shows L2 compression fracture).  PMH significant for AFib and patient on warfarin PTA and UTI (recently completed course of antibiotics).  Admitting INR = 5.31 with warfarin regimen of 87m57maily except 6mg19m Thursdays (last dose taken 5/19)  INR (5/21) = 4.93;  Vitamin K 10mg69mx 1 given 5/21  Plan for kyphoplasty once INR < 1.6  IV heparin per pharmacy to begin when INR < 2  Today, 07/23/2016  Heparin level 0.78, supratherapeutic  on 1000 units/hr  INR down to 1.29  No bleeding reported  Goal of Therapy:  Heparin level 0.3-0.7 units/ml Monitor platelets by anticoagulation protocol: Yes   Plan:   Decrease IV heparin to 850 units/hr  Repeat heparin level in 8hr   Follow up plans for kyphoplasty - will need to confirm timing for stopping heparin prior to procedure  Peggyann Juba, PharmD, BCPS Pager: [phone] 07/23/2016,11:13 AM

## 2016-07-23 NOTE — Progress Notes (Signed)
ANTICOAGULATION CONSULT NOTE - Follow Up Consult  Pharmacy Consult for heparin Indication: atrial fibrillation  Allergies  Allergen Reactions  . Metoprolol Other (See Comments)    Reaction:  Bradycardia with 25mg  BID dose    Patient Measurements: Height: 5\' 8"  (172.7 cm) Weight: 196 lb 13.9 oz (89.3 kg) IBW/kg (Calculated) : 68.4 Heparin Dosing Weight:   Vital Signs: Temp: 97.5 F (36.4 C) (05/22 2137) Temp Source: Oral (05/22 2137) BP: 121/60 (05/22 2137) Pulse Rate: 60 (05/22 2137)  Labs:  Recent Labs  07/20/16 0505 07/21/16 0535 07/22/16 0531 07/22/16 1552 07/23/16 0019  HGB 13.2 13.6  --   --   --   HCT 40.6 41.7  --   --   --   PLT 197 196  --   --   --   LABPROT 50.2* 47.3* 19.2*  --  16.2*  INR 5.31* 4.93* 1.60  --  1.29  HEPARINUNFRC  --   --   --  0.41 0.55  CREATININE 0.89 0.96 0.93  --  0.99    Estimated Creatinine Clearance: 60.3 mL/min (by C-G formula based on SCr of 0.99 mg/dL).   Medications:  Infusions:  . sodium chloride    . ceFEPime (MAXIPIME) IV Stopped (07/22/16 2230)  . heparin 1,000 Units/hr (07/22/16 0800)    Assessment: Patient with heparin level at goal.  No heparin issues noted.  Goal of Therapy:  Heparin level 0.3-0.7 units/ml Monitor platelets by anticoagulation protocol: Yes   Plan:  Continue heparin drip at current rate Recheck level at 0800  Tyler Deis, Virgie [name] 07/23/2016,4:25 AM

## 2016-07-23 NOTE — Care Management Note (Signed)
Case Management Note  Patient Details  Name: Eric Rivers MRN: [id number] Date of Birth: March 01, 1931  Subjective/Objective: IR following for possible kypho inpt vs otpt once UTI cleared. PT-recc SNF. CSW already following.                  Action/Plan:d/c SNF.   Expected Discharge Date:                  Expected Discharge Plan:  Skilled Nursing Facility  In-House Referral:  Clinical Social Work  Discharge planning Services  CM Consult  Post Acute Care Choice:    Choice offered to:  Patient  DME Arranged:    DME Agency:     HH Arranged:    [place] Agency:  Other - See comment ([facility]-has own HHPT)  Status of Service:  In process, will continue to follow  If discussed at Long Length of Stay Meetings, dates discussed:    Additional Comments:  Dessa Phi, RN 07/23/2016, 12:39 PM

## 2016-07-24 LAB — PROTIME-INR
INR: 1.21
Prothrombin Time: 15.4 seconds — ABNORMAL HIGH (ref 11.4–15.2)
Protime: 15.4 seconds — AB (ref 10.0–13.8)

## 2016-07-24 LAB — CBC
HCT: 38.1 % — ABNORMAL LOW (ref 39.0–52.0)
HEMOGLOBIN: 12.5 g/dL — AB (ref 13.0–17.0)
MCH: 31.3 pg (ref 26.0–34.0)
MCHC: 32.8 g/dL (ref 30.0–36.0)
MCV: 95.3 fL (ref 78.0–100.0)
Platelets: 183 10*3/uL (ref 150–400)
RBC: 4 MIL/uL — AB (ref 4.22–5.81)
RDW: 15.4 % (ref 11.5–15.5)
WBC: 12.4 10*3/uL — ABNORMAL HIGH (ref 4.0–10.5)

## 2016-07-24 LAB — BASIC METABOLIC PANEL
ANION GAP: 6 (ref 5–15)
BUN: 23 mg/dL — ABNORMAL HIGH (ref 6–20)
BUN: 23 mg/dL — AB (ref 4–21)
CHLORIDE: 103 mmol/L (ref 101–111)
CO2: 26 mmol/L (ref 22–32)
Calcium: 9.5 mg/dL (ref 8.9–10.3)
Creatinine, Ser: 0.9 mg/dL (ref 0.61–1.24)
Creatinine: 0.9 mg/dL (ref 0.6–1.3)
GFR calc non Af Amer: >60 mL/min (ref >60)
GLUCOSE: 127 mg/dL
Glucose, Bld: 127 mg/dL — ABNORMAL HIGH (ref 65–99)
POTASSIUM: 4.6 mmol/L (ref 3.5–5.1)
Potassium: 4.6 mmol/L (ref 3.4–5.3)
Sodium: 135 mmol/L (ref 135–145)
Sodium: 135 mmol/L — AB (ref 137–147)

## 2016-07-24 LAB — GLUCOSE, CAPILLARY
GLUCOSE-CAPILLARY: 158 mg/dL — AB (ref 65–99)
GLUCOSE-CAPILLARY: 163 mg/dL — AB (ref 65–99)
GLUCOSE-CAPILLARY: 203 mg/dL — AB (ref 65–99)
Glucose-Capillary: 118 mg/dL — ABNORMAL HIGH (ref 65–99)

## 2016-07-24 LAB — CBC AND DIFFERENTIAL
HCT: 38 % — AB (ref 41–53)
Hemoglobin: 12.5 g/dL — AB (ref 13.5–17.5)
PLATELETS: 183 10*3/uL (ref 150–399)
WBC: 12.4 10*3/mL

## 2016-07-24 LAB — POCT INR: INR: 1.2 — AB (ref 0.9–1.1)

## 2016-07-24 LAB — HEPARIN LEVEL (UNFRACTIONATED): Heparin Unfractionated: 0.36 IU/mL (ref 0.30–0.70)

## 2016-07-24 MED ORDER — FUROSEMIDE 40 MG PO TABS
40.0000 mg | ORAL_TABLET | Freq: Every day | ORAL | Status: DC
  Administered 2016-07-24 – 2016-07-28 (×5): 40 mg via ORAL
  Filled 2016-07-24 (×5): qty 1

## 2016-07-24 MED ORDER — OXYCODONE HCL 5 MG PO TABS
10.0000 mg | ORAL_TABLET | ORAL | Status: DC | PRN
  Administered 2016-07-24 – 2016-07-26 (×5): 10 mg via ORAL
  Filled 2016-07-24 (×6): qty 2

## 2016-07-24 NOTE — Progress Notes (Signed)
PT Cancellation Note  Patient Details Name: Eric Rivers MRN: [id number] DOB: February 02, 1931   Cancelled Treatment:     pt unable to tolerate due to his pain level.  Hopefully he will be able to undergo KP tomorrow.     Rica Koyanagi  PTA [facility]  Acute  Rehab Pager      [phone]

## 2016-07-24 NOTE — NC FL2 (Signed)
[place] LEVEL OF CARE SCREENING TOOL     IDENTIFICATION  Patient Name: Eric Rivers Birthdate: 1930-08-19 Sex: male Admission Date (Current Location): 07/19/2016  [facility] and [place] Number:  Herbalist and Address:  [facility],  [place] [address], [place]      Provider Number: [id number]  Attending Physician Name and Address:  Aline August, MD  Relative Name and Phone Number:       Current Level of Care: Hospital Recommended Level of Care: [place] Prior Approval Number:    Date Approved/Denied:   PASRR Number: [id number]  Discharge Plan: SNF    Current Diagnoses: Patient Active Problem List   Diagnosis Date Noted  . UTI (urinary tract infection) 07/20/2016  . Intractable back pain 07/20/2016  . Closed compression fracture of L2 lumbar vertebra ([place]) 07/15/2016  . Radiculitis 07/15/2016  . Gait disorder 07/15/2016  . Acute UTI 07/07/2016  . Low back pain 07/04/2016  . Left-sided nosebleed 06/12/2016  . Epistaxis 04/11/2016  . Degenerative arthritis of right knee 09/20/2015  . Pronation deformity of both feet 09/20/2015  . Acute on chronic diastolic heart failure ([place]) 09/19/2015  . Dyspnea 09/07/2015  . Peripheral edema 09/07/2015  . Right knee pain 09/07/2015  . Hematuria 06/13/2015  . AKI (acute kidney injury) ([place]) 06/02/2015  . Elevated INR 06/01/2015  . Anemia 06/01/2015  . Hemorrhagic cystitis 06/01/2015  . Cough 04/20/2014  . Bradycardia 01/06/2014  . Diverticulosis of colon with hemorrhage 01/01/2014  . Perianal dermatitis 01/01/2014  . Chronic anemia 12/29/2013  . Lower GI bleed 12/29/2013  . Warfarin-induced coagulopathy ([place]) 12/29/2013  . Acute blood loss anemia 12/29/2013  . Chronic diastolic CHF (congestive heart failure) ([place]) 11/02/2013  . Syncope 07/19/2013  . Advanced care planning/counseling discussion 04/12/2013  . Encounter for therapeutic drug monitoring  04/12/2013  . Insomnia 04/11/2013  . Mitral regurgitation   . Ascending aortic dissection ([place]) 04/16/2012  . Malignant neoplasm of prostate ([place]) 10/27/2011  . Warfarin anticoagulation   . Hypertension   . GERD (gastroesophageal reflux disease)   . Aortic stenosis   . Pulmonary hypertension ([place])   . Hyperlipidemia   . Ejection fraction   . Normal nuclear stress test   . Routine general medical examination at a health care facility 08/22/2010  . Bladder cancer ([place])   . Gout   . Chronic atrial fibrillation ([place])   . HYPERTROPHY PROSTATE W/O UR OBST & OTH LUTS 09/07/2007  . DEGENERATIVE JOINT DISEASE, GENERALIZED 09/07/2007  . NEOP, MALIGNANT, BLADDER NEC 12/04/2006  . Diabetes mellitus with neuropathy ([place]) 12/04/2006    Orientation RESPIRATION BLADDER Height & Weight     Self, Time, Situation, Place  Normal External catheter Weight: 196 lb 13.9 oz (89.3 kg) Height:  5\' 8"  (172.7 cm)  BEHAVIORAL SYMPTOMS/MOOD NEUROLOGICAL BOWEL NUTRITION STATUS        Diet (heart healthy/carb modified )  AMBULATORY STATUS COMMUNICATION OF NEEDS Skin   Extensive Assist Verbally Normal                       Personal Care Assistance Level of Assistance  Bathing, Feeding, Dressing Bathing Assistance: Limited assistance Feeding assistance: Independent Dressing Assistance: Limited assistance     Functional Limitations Info  Sight Sight Info: Impaired        SPECIAL CARE FACTORS FREQUENCY  PT (By licensed PT), OT (By licensed OT)     PT Frequency: 5x/week OT Frequency: 5x/week  Contractures Contractures Info: Not present    Additional Factors Info  Code Status, Allergies Code Status Info: DNR Allergies Info: Allergies:  Metoprolol           Current Medications (07/24/2016):  This is the current hospital active medication list Current Facility-Administered Medications  Medication Dose Route Frequency Provider Last Rate Last Dose  . 0.9 %  sodium chloride  infusion  250 mL Intravenous PRN Jonetta Osgood, MD      . acetaminophen (TYLENOL) tablet 1,000 mg  1,000 mg Oral Q8H Ghimire, Henreitta Leber, MD   1,000 mg at 07/24/16 2353  . albuterol (PROVENTIL) (2.5 MG/3ML) 0.083% nebulizer solution 2.5 mg  2.5 mg Nebulization Q2H PRN Jonetta Osgood, MD      . allopurinol (ZYLOPRIM) tablet 300 mg  300 mg Oral Daily Jonetta Osgood, MD   300 mg at 07/24/16 0914  . atorvastatin (LIPITOR) tablet 20 mg  20 mg Oral q1800 Jonetta Osgood, MD   20 mg at 07/23/16 1749  . calcium-vitamin D (OSCAL WITH D) 500-200 MG-UNIT per tablet 1 tablet  1 tablet Oral QHS Jonetta Osgood, MD   1 tablet at 07/23/16 2208  . ceFEPIme (MAXIPIME) 2 g in dextrose 5 % 50 mL IVPB  2 g Intravenous Q12H Florencia Reasons, MD   Stopped at 07/24/16 [id number]  . ferrous sulfate tablet 325 mg  325 mg Oral Q breakfast Florencia Reasons, MD   325 mg at 07/24/16 0914  . finasteride (PROSCAR) tablet 5 mg  5 mg Oral q morning - 10a Ghimire, Henreitta Leber, MD   5 mg at 07/24/16 0915  . furosemide (LASIX) tablet 40 mg  40 mg Oral Daily Alekh, Kshitiz, MD      . gabapentin (NEURONTIN) capsule 100 mg  100 mg Oral TID Jonetta Osgood, MD   100 mg at 07/24/16 0914  . heparin ADULT infusion 100 units/mL (25000 units/220mL sodium chloride 0.45%)  850 Units/hr Intravenous Continuous Thomes Lolling, RPH 8.5 mL/hr at 07/24/16 1213 850 Units/hr at 07/24/16 1213  . insulin aspart (novoLOG) injection 0-9 Units  0-9 Units Subcutaneous TID WC Jonetta Osgood, MD   2 Units at 07/24/16 1213  . morphine 4 MG/ML injection 1 mg  1 mg Intravenous Q4H PRN Jonetta Osgood, MD   1 mg at 07/24/16 0326  . multivitamin with minerals tablet 1 tablet  1 tablet Oral Daily Jonetta Osgood, MD   1 tablet at 07/24/16 0914  . ondansetron (ZOFRAN) tablet 4 mg  4 mg Oral Q6H PRN Ghimire, Henreitta Leber, MD       Or  . ondansetron (ZOFRAN) injection 4 mg  4 mg Intravenous Q6H PRN Ghimire, Henreitta Leber, MD      . oxyCODONE (Oxy IR/ROXICODONE)  immediate release tablet 10 mg  10 mg Oral Q4H PRN Alekh, Kshitiz, MD      . pantoprazole (PROTONIX) EC tablet 40 mg  40 mg Oral Daily Jonetta Osgood, MD   40 mg at 07/24/16 0914  . polyethylene glycol (MIRALAX / GLYCOLAX) packet 17 g  17 g Oral Daily Florencia Reasons, MD   17 g at 07/24/16 0915  . potassium chloride (K-DUR,KLOR-CON) CR tablet 10 mEq  10 mEq Oral Daily Jonetta Osgood, MD   10 mEq at 07/24/16 0915  . [START ON 07/26/2016] predniSONE (DELTASONE) tablet 10 mg  10 mg Oral Q breakfast Florencia Reasons, MD      . Derrill Memo ON 07/25/2016] predniSONE (DELTASONE)  tablet 20 mg  20 mg Oral Q breakfast Florencia Reasons, MD      . senna-docusate (Senokot-S) tablet 1 tablet  1 tablet Oral BID Florencia Reasons, MD   1 tablet at 07/24/16 0914  . sodium chloride flush (NS) 0.9 % injection 3 mL  3 mL Intravenous Q12H Jonetta Osgood, MD   3 mL at 07/23/16 2210  . sodium chloride flush (NS) 0.9 % injection 3 mL  3 mL Intravenous PRN Jonetta Osgood, MD      . tamsulosin (FLOMAX) capsule 0.4 mg  0.4 mg Oral QHS Jonetta Osgood, MD   0.4 mg at 07/23/16 2208  . tiZANidine (ZANAFLEX) tablet 4 mg  4 mg Oral Q6H PRN Florencia Reasons, MD   4 mg at 07/23/16 0355  . traZODone (DESYREL) tablet 50 mg  50 mg Oral QHS Jonetta Osgood, MD   50 mg at 07/23/16 2208     Discharge Medications: Please see discharge summary for a list of discharge medications.  Relevant Imaging Results:  Relevant Lab Results:   Additional Information SSN [id number]  Debraann Livingstone, Randall An, LCSW

## 2016-07-24 NOTE — Progress Notes (Signed)
[facility] for Heparin Indication: atrial fibrillation  Allergies  Allergen Reactions  . Metoprolol Other (See Comments)    Reaction:  Bradycardia with 63m BID dose    Patient Measurements: Height: _0  (172.7 cm) Weight: 196 lb 13.9 oz (89.3 kg) IBW/kg (Calculated) : 68.4 Heparin Dosing Weight: 87 kg  Vital Signs: Temp: 97.8 F (36.6 C) (05/24 0620) Temp Source: Oral (05/24 0620) BP: 137/75 (05/24 0620) Pulse Rate: 60 (05/24 0620)  Labs:  Recent Labs  07/22/16 0531  07/23/16 0019 07/23/16 0855 07/23/16 1938 07/24/16 0519  HGB  --   --   --   --  12.5* 12.5*  HCT  --   --   --   --  38.1* 38.1*  PLT  --   --   --   --  219 183  LABPROT 19.2*  --  16.2*  --   --  15.4*  INR 1.60  --  1.29  --   --  1.21  HEPARINUNFRC  --   < > 0.55 0.78* 0.45 0.36  CREATININE 0.93  --  0.99  --   --  0.90  < > = values in this interval not displayed.  Estimated Creatinine Clearance: 66.4 mL/min (by C-G formula based on SCr of 0.9 mg/dL).   Medical History: Past Medical History:  Diagnosis Date  . ACE-inhibitor cough   . Aortic dissection ([facility]    Surgical repair February, 2014  . Aortic stenosis    mild....echo... september... 2010/ mild... echo..[name] KitchenMarland [place], 2011  . Atrial fibrillation (HCC)     Chronic,   24 hour holter, September, 2011.... atrial fib rate is controlled.... there is some bradycardia but  no marked pauses  . Bladder cancer ([facility]    recurrence with TUR-B March '09  . DDD (degenerative disc disease)    lumbar spine  . Diabetes mellitus    type 2  . Diabetes mellitus with neuropathy ([facility] 12/04/2006   Qualifier: Diagnosis of  By: NLinda HedgesMD, MHeinz Knuckles medications - DPP4   . Diverticulosis of colon with hemorrhage 01/01/2014  . Ejection fraction    EF 60%, echo, 01/2010  . Fluid overload   . Fracture of metacarpal bone 11/30/11  . GERD (gastroesophageal reflux disease)   . Gout   . History of echocardiogram    Echo 5/17 -  mild LVH, EF 55-60%, mod AS (mean 16 mmHg, peak 29 mmHg), MAC, midl MR, massive BAE, mod dilated RV, low normal RVSF, mod TR, PASP 64 mmHg  . Hx of colonoscopy    approx. 10 years  . Hypercholesterolemia   . Hyperlipidemia   . Hypertension   . Mitral regurgitation   . Normal nuclear stress test    06/2005 , also ABI normal 2007  . Prostate cancer ([facility]    radiation tx.  . Pulmonary hypertension ([facility]    416mg, echo, 01/2010  . Shortness of breath    on exertion  . Warfarin anticoagulation    Atrial fib  . Whooping cough      Assessment:  [age] male admitted on 5/20 with low back pain (xray shows L2 compression fracture).  PMH significant for AFib and patient on warfarin PTA and UTI (recently completed course of antibiotics).  Admitting INR = 5.31 with warfarin regimen of 60m51maily except 6mg63m Thursdays (last dose taken 5/19)  INR (5/21) = 4.93;  Vitamin K 10mg86mx 1 given 5/21  Plan for kyphoplasty once  INR < 1.6  IV heparin per pharmacy to begin when INR < 2  Today, 07/24/2016  Heparin level 0.36, therapeutic on 850 units/hr  INR down to 1.21  No bleeding reported  Goal of Therapy:  Heparin level 0.3-0.7 units/ml Monitor platelets by anticoagulation protocol: Yes   Plan:   Continue IV heparin at 850 units/hr  Daily heparin level & CBC while on heparin   Kyphoplasty planned for 5/25 PM- will need to confirm timing for stopping heparin prior to procedure  Netta Cedars, PharmD, BCPS Pager: [phone] 07/24/2016,10:19 AM

## 2016-07-24 NOTE — Progress Notes (Signed)
Patient ID: Eric Rivers, male   DOB: 08-08-1930, 83 y.o.   MRN: [id number]  PROGRESS NOTE    CHESTER SIBERT  OLM:[id number] DOB: [birth year] DOA: 07/19/2016 PCP: Biagio Borg, MD   Brief Narrative:  83 y.o.malewith medical history significant of DM-2, HTN, Afib on coumadin, recently diagnosed with L2 compression fracture-presented to the ED for evaluation of intractable back pain. He recently was seen in the ED on and 5/10 and 5/14 for similar problems. He had a mechanical fall earlier this month, with gradual worsening of his low back pain. A MRI LS Spine on 5/14 confirmed a L2 compression fracture, he was started on oral narcotics and other supportive measures, but continued to have pain. He presented back to the ED on 5/19-continued to have pain inspite of narcotics, benzo's, and was also found to have a UTI and coagulopathy. Patient was started on for spectrum antibiotics. Urine culture grew Citrobacter resistant to Rocephin; antibiotic was switched to cefepime on 07/22/2016. Patient is currently on heparin drip. INR is improved. Intervention radiology is following the patient regarding the need for kyphoplasty which is tentatively planned for 07/25/2016.   Assessment & Plan:   Principal Problem:   UTI (urinary tract infection) Active Problems:   Diabetes mellitus with neuropathy (HCC)   Chronic atrial fibrillation (HCC)   Hypertension   Hyperlipidemia   Chronic diastolic CHF (congestive heart failure) (HCC)   Warfarin-induced coagulopathy (HCC)   Closed compression fracture of L2 lumbar vertebra (HCC)   Intractable back pain   Intractable low back pain:likely due to L2 compression fracture. --Lumbar x ray on presentation: Moderate L2 compression fracture age indeterminate, unchanged from the recent CT and new since 06/11/2015. Mild spondylosis of the lumbar spine with disc disease at the L4-5 level. Grade 1 anterolisthesis of L4 on L5 due to the moderate facet  arthropathy. -- Dr Sloan Leiter discussed the case with Dr. Rosalee Kaufman on-call-who suggested that we continue with supportive measures, and if he continues to have pain consult interventional radiology for possible kyphoplasty.  -IR Consulted, patient is deemed to be a candidate for kyphoplasty once INR less than 1.6, and UTI adequated treated, likely Friday. Probable plan for kyphoplasty on 07/25/2016 by IR. We'll keep patient nothing by mouth for tomorrow morning. -apply TLSO brace  -Pain management, stool regimen - continue trial of steroid taper - Monitor off fentanyl -PT/OT eval  Coagulopathy:  --INR 5.3 on admission - Resolved. INR is 1.21 today -Coumadin held since admission, iv vitk on 5/21 goal of INR less than 1.6 for IR procedure; continue heparin drip per pharmacy -need to confirm with IR about resuming coumadin post procedure   Recurrent UTI:  -CT renal stone:no hydro, no renal stone, Bladder is underdistended. Stable high density material/calcification along the posterior aspect of the bladder (series 2/image 66) extending into a prostate TURP defect (series 2/image 71). -urine culture Citrobacter that is resistant to rocephin, change abx to cipro on 5/22 which was again held because of bradycardia. Continue cefepime for now. - CT scan finding was discussed with urology on call Dr Karsten Ro who recommended finish abx treatment and outpatient follow with Dr Tresa Moore within one week of discharge to consider cystoscopy and stone removal.  Chronic Atrial fibrillation: --currently rate controlled without the use of any rate control agent ( h/o bradycardia on betablocker) - Intermittent bradycardia --on coumadin and asa 325 at home, presented with supratherapeutic INR --both held since admission, heparin drip per pharmacy for now  H/o diastolic chf: euvolemic ,  close monitor volume status. Resume Lasix  as  blood pressure is improved  noninsulin dependentType 2  diabetes: Hold oral agents januvia-place on SSI and follow CBGs. Monitor blood sugar While on short course of steroids  Hypertension: follow BP trend. Amlodipine on hold for now. Restart lasix  BPH:s/p TURP in 2014, Continue Flomax/proscar. Outpatient urology follow-up  Dyslipidemia: Continue statin  Gout:Continue allopurinol-no evidence of flare at this time.  Leukocytosis: Repeat a.m. labs  CONSULTS: Neurosurgery Dr Ronnald Ramp by phone on admission IR Urology Dr Karsten Ro over the phone on 5/22  DVT Prophylaxis: Heparin drip  Code Status: DNR  Family Communication: None present at bedside  Disposition Plan:  Probable Discharge to SNF in 2-3 days Procedures:  Plan for kyphoplasty, might be early next week, IR prefer uti fully treated prior to porcedure   Antibiotics:  Rocephin from admission to 5/22  cipro from 5/22- 5/22  Cefepime from 5/22>> Subjective: Patient seen and examined at bedside. He is more awake this morning. No Overnight fever, nausea, vomiting. He still complains of back pain  Objective: Vitals:   07/23/16 0546 07/23/16 1300 07/23/16 2105 07/24/16 0620  BP: 120/69 (!) 144/69 129/81 137/75  Pulse: 60 (!) 57 70 60  Resp: 18 17 18 16   Temp: 97.6 F (36.4 C) 97.7 F (36.5 C) 97.5 F (36.4 C) 97.8 F (36.6 C)  TempSrc: Axillary Axillary Oral Oral  SpO2: 97% 99% 99% 97%  Weight:      Height:        Intake/Output Summary (Last 24 hours) at 07/24/16 1334 Last data filed at 07/24/16 2500  Gross per 24 hour  Intake           567.25 ml  Output              350 ml  Net           217.25 ml   Filed Weights   07/19/16 2009 07/20/16 0826  Weight: 95.3 kg (210 lb) 89.3 kg (196 lb 13.9 oz)    Examination:  General exam: Appears calm and comfortable  Respiratory system: Bilateral decreased breath sound at bases With scattered crackles Cardiovascular system: S1 & S2 heard, intermittent bradycardia  Gastrointestinal system: Abdomen  is nondistended, soft and nontender. Normal bowel sounds heard. Extremities: No cyanosis, clubbing, trace pedal edema     Data Reviewed: I have personally reviewed following labs and imaging studies  CBC:  Recent Labs Lab 07/20/16 0505 07/21/16 0535 07/23/16 1938 07/24/16 0519  WBC 12.2* 10.4 12.3* 12.4*  NEUTROABS 9.7*  --   --   --   HGB 13.2 13.6 12.5* 12.5*  HCT 40.6 41.7 38.1* 38.1*  MCV 95.3 96.5 94.8 95.3  PLT 197 196 219 370   Basic Metabolic Panel:  Recent Labs Lab 07/20/16 0505 07/21/16 0535 07/22/16 0531 07/23/16 0019 07/24/16 0519  NA 136 139 137 135 135  K 3.9 3.4* 3.6 4.4 4.6  CL 102 99* 99* 102 103  CO2 22 29 30 26 26   GLUCOSE 126* 114* 126* 171* 127*  BUN 12 13 15 19  23*  CREATININE 0.89 0.96 0.93 0.99 0.90  CALCIUM 8.6* 9.1 9.2 9.0 9.5  MG  --   --  1.7  --   --    GFR: Estimated Creatinine Clearance: 66.4 mL/min (by C-G formula based on SCr of 0.9 mg/dL). Liver Function Tests: No results for input(s): AST, ALT, ALKPHOS, BILITOT, PROT, ALBUMIN in the last 168 hours. No results for input(s): LIPASE, AMYLASE  in the last 168 hours. No results for input(s): AMMONIA in the last 168 hours. Coagulation Profile:  Recent Labs Lab 07/20/16 0505 07/21/16 0535 07/22/16 0531 07/23/16 0019 07/24/16 0519  INR 5.31* 4.93* 1.60 1.29 1.21   Cardiac Enzymes: No results for input(s): CKTOTAL, CKMB, CKMBINDEX, TROPONINI in the last 168 hours. BNP (last 3 results)  Recent Labs  09/07/15 1107  PROBNP 566.0*   HbA1C:  Recent Labs  07/22/16 0531  HGBA1C 5.9*   CBG:  Recent Labs Lab 07/23/16 1147 07/23/16 1652 07/23/16 2103 07/24/16 0726 07/24/16 1133  GLUCAP 143* 161* 159* 118* 158*   Lipid Profile: No results for input(s): CHOL, HDL, LDLCALC, TRIG, CHOLHDL, LDLDIRECT in the last 72 hours. Thyroid Function Tests: No results for input(s): TSH, T4TOTAL, FREET4, T3FREE, THYROIDAB in the last 72 hours. Anemia Panel: No results for  input(s): VITAMINB12, FOLATE, FERRITIN, TIBC, IRON, RETICCTPCT in the last 72 hours. Sepsis Labs: No results for input(s): PROCALCITON, LATICACIDVEN in the last 168 hours.  Recent Results (from the past 240 hour(s))  Urine culture     Status: None   Collection Time: 07/15/16 11:52 AM  Result Value Ref Range Status   Organism ID, Bacteria NO GROWTH  Final  Urine culture     Status: Abnormal   Collection Time: 07/19/16  8:24 PM  Result Value Ref Range Status   Specimen Description URINE, CLEAN CATCH  Final   Special Requests NONE  Final   Culture 20,000 COLONIES/mL CITROBACTER FREUNDII (A)  Final   Report Status 07/22/2016 FINAL  Final   Organism ID, Bacteria CITROBACTER FREUNDII (A)  Final      Susceptibility   Citrobacter freundii - MIC*    CEFAZOLIN >=64 RESISTANT Resistant     CEFEPIME <=1 SENSITIVE Sensitive     CEFTRIAXONE >=64 RESISTANT Resistant     CIPROFLOXACIN <=0.25 SENSITIVE Sensitive     GENTAMICIN <=1 SENSITIVE Sensitive     IMIPENEM <=0.25 SENSITIVE Sensitive     NITROFURANTOIN <=16 SENSITIVE Sensitive     TRIMETH/SULFA <=20 SENSITIVE Sensitive     PIP/TAZO 64 INTERMEDIATE Intermediate     * 20,000 COLONIES/mL CITROBACTER FREUNDII  Culture, blood (routine x 2)     Status: None (Preliminary result)   Collection Time: 07/22/16  4:43 PM  Result Value Ref Range Status   Specimen Description BLOOD LEFT ANTECUBITAL  Final   Special Requests   Final    BOTTLES DRAWN AEROBIC AND ANAEROBIC Blood Culture results may not be optimal due to an excessive volume of blood received in culture bottles   Culture   Final    NO GROWTH < 24 HOURS Performed at [facility], [place] [address] [place], [place] [address]    Report Status PENDING  Incomplete  Culture, blood (routine x 2)     Status: None (Preliminary result)   Collection Time: 07/22/16  4:43 PM  Result Value Ref Range Status   Specimen Description BLOOD LEFT HAND  Final   Special Requests IN PEDIATRIC BOTTLE Blood  Culture adequate volume  Final   Culture   Final    NO GROWTH < 24 HOURS Performed at [facility], [place] [address], [place], [place] [address]    Report Status PENDING  Incomplete         Radiology Studies: No results found.      Scheduled Meds: . acetaminophen  1,000 mg Oral Q8H  . allopurinol  300 mg Oral Daily  . atorvastatin  20  mg Oral q1800  . calcium-vitamin D  1 tablet Oral QHS  . ferrous sulfate  325 mg Oral Q breakfast  . finasteride  5 mg Oral q morning - 10a  . gabapentin  100 mg Oral TID  . insulin aspart  0-9 Units Subcutaneous TID WC  . multivitamin with minerals  1 tablet Oral Daily  . pantoprazole  40 mg Oral Daily  . polyethylene glycol  17 g Oral Daily  . potassium chloride  10 mEq Oral Daily  . [START ON 07/26/2016] predniSONE  10 mg Oral Q breakfast  . [START ON 07/25/2016] predniSONE  20 mg Oral Q breakfast  . senna-docusate  1 tablet Oral BID  . sodium chloride flush  3 mL Intravenous Q12H  . tamsulosin  0.4 mg Oral QHS  . traZODone  50 mg Oral QHS   Continuous Infusions: . sodium chloride    . ceFEPime (MAXIPIME) IV Stopped (07/24/16 0946)  . heparin 850 Units/hr (07/24/16 1213)     LOS: 4 days        Aline August, MD [facility] Pager [phone]  If 7PM-7AM, please contact night-coverage [email or web address] Password [email or web address] 07/24/2016, 1:34 PM

## 2016-07-24 NOTE — Care Management Note (Signed)
Case Management Note  Patient Details  Name: Eric Rivers MRN: [id number] Date of Birth: 07-Mar-1930  Subjective/Objective:Noted for IR-kyphoplasty in am. CSW already following-likely SNF-[facility].                    Action/Plan:d/c SNF.   Expected Discharge Date:                  Expected Discharge Plan:  Skilled Nursing Facility  In-House Referral:  Clinical Social Work  Discharge planning Services  CM Consult  Post Acute Care Choice:    Choice offered to:  Patient  DME Arranged:    DME Agency:     HH Arranged:    [place] Agency:  Other - See comment ([facility]-has own HHPT)  Status of Service:  In process, will continue to follow  If discussed at Long Length of Stay Meetings, dates discussed:    Additional Comments:  Dessa Phi, RN 07/24/2016, 11:36 AM

## 2016-07-25 ENCOUNTER — Encounter (HOSPITAL_COMMUNITY): Payer: Self-pay | Admitting: Interventional Radiology

## 2016-07-25 ENCOUNTER — Inpatient Hospital Stay (HOSPITAL_COMMUNITY): Payer: Medicare Other

## 2016-07-25 ENCOUNTER — Encounter: Payer: Self-pay | Admitting: Internal Medicine

## 2016-07-25 DIAGNOSIS — N3001 Acute cystitis with hematuria: Secondary | ICD-10-CM

## 2016-07-25 HISTORY — PX: IR KYPHO LUMBAR INC FX REDUCE BONE BX UNI/BIL CANNULATION INC/IMAGING: IMG5519

## 2016-07-25 LAB — CBC WITH DIFFERENTIAL/PLATELET
BASOS PCT: 0 %
Basophils Absolute: 0 10*3/uL (ref 0.0–0.1)
Eosinophils Absolute: 0 10*3/uL (ref 0.0–0.7)
Eosinophils Relative: 0 %
HEMATOCRIT: 38.6 % — AB (ref 39.0–52.0)
Hemoglobin: 12.7 g/dL — ABNORMAL LOW (ref 13.0–17.0)
LYMPHS ABS: 0.8 10*3/uL (ref 0.7–4.0)
LYMPHS PCT: 7 %
MCH: 31.2 pg (ref 26.0–34.0)
MCHC: 32.9 g/dL (ref 30.0–36.0)
MCV: 94.8 fL (ref 78.0–100.0)
MONO ABS: 0.6 10*3/uL (ref 0.1–1.0)
Monocytes Relative: 5 %
NEUTROS PCT: 88 %
Neutro Abs: 10.4 10*3/uL — ABNORMAL HIGH (ref 1.7–7.7)
PLATELETS: 216 10*3/uL (ref 150–400)
RBC: 4.07 MIL/uL — AB (ref 4.22–5.81)
RDW: 15.3 % (ref 11.5–15.5)
WBC: 11.8 10*3/uL — ABNORMAL HIGH (ref 4.0–10.5)

## 2016-07-25 LAB — BASIC METABOLIC PANEL
Anion gap: 9 (ref 5–15)
BUN: 23 mg/dL — ABNORMAL HIGH (ref 6–20)
BUN: 23 mg/dL — AB (ref 4–21)
CALCIUM: 9.6 mg/dL (ref 8.9–10.3)
CHLORIDE: 98 mmol/L — AB (ref 101–111)
CO2: 29 mmol/L (ref 22–32)
CREATININE: 0.93 mg/dL (ref 0.61–1.24)
Creatinine: 0.9 mg/dL (ref 0.6–1.3)
GFR calc Af Amer: >60 mL/min (ref >60)
GFR calc non Af Amer: >60 mL/min (ref >60)
Glucose, Bld: 167 mg/dL — ABNORMAL HIGH (ref 65–99)
Glucose: 167 mg/dL
Potassium: 4.5 mmol/L (ref 3.4–5.3)
Potassium: 4.5 mmol/L (ref 3.5–5.1)
Sodium: 136 mmol/L (ref 135–145)
Sodium: 136 mmol/L — AB (ref 137–147)

## 2016-07-25 LAB — PROTIME-INR
INR: 1.22
PROTHROMBIN TIME: 15.5 s — AB (ref 11.4–15.2)
Protime: 15.5 seconds — AB (ref 10.0–13.8)

## 2016-07-25 LAB — CBC AND DIFFERENTIAL
HCT: 39 % — AB (ref 41–53)
Hemoglobin: 12.7 g/dL — AB (ref 13.5–17.5)
Platelets: 216 10*3/uL (ref 150–399)
WBC: 11.8 10^3/mL

## 2016-07-25 LAB — GLUCOSE, CAPILLARY
GLUCOSE-CAPILLARY: 172 mg/dL — AB (ref 65–99)
GLUCOSE-CAPILLARY: 159 mg/dL — AB (ref 65–99)
Glucose-Capillary: 151 mg/dL — ABNORMAL HIGH (ref 65–99)
Glucose-Capillary: 163 mg/dL — ABNORMAL HIGH (ref 65–99)

## 2016-07-25 LAB — HEPARIN LEVEL (UNFRACTIONATED): Heparin Unfractionated: 0.4 IU/mL (ref 0.30–0.70)

## 2016-07-25 LAB — POCT INR: INR: 1.2 — AB (ref 0.9–1.1)

## 2016-07-25 MED ORDER — IOPAMIDOL (ISOVUE-300) INJECTION 61%
INTRAVENOUS | Status: AC
  Administered 2016-07-25: 20 mL
  Filled 2016-07-25: qty 50

## 2016-07-25 MED ORDER — FENTANYL CITRATE (PF) 100 MCG/2ML IJ SOLN
INTRAMUSCULAR | Status: AC
  Filled 2016-07-25: qty 4

## 2016-07-25 MED ORDER — LIDOCAINE HCL (PF) 1 % IJ SOLN
INTRAMUSCULAR | Status: AC | PRN
  Administered 2016-07-25: 20 mL

## 2016-07-25 MED ORDER — MIDAZOLAM HCL 2 MG/2ML IJ SOLN
INTRAMUSCULAR | Status: AC
  Filled 2016-07-25: qty 4

## 2016-07-25 MED ORDER — LIDOCAINE HCL (PF) 1 % IJ SOLN
INTRAMUSCULAR | Status: AC
  Filled 2016-07-25: qty 30

## 2016-07-25 MED ORDER — FENTANYL CITRATE (PF) 100 MCG/2ML IJ SOLN
INTRAMUSCULAR | Status: AC | PRN
  Administered 2016-07-25 (×3): 25 ug via INTRAVENOUS
  Administered 2016-07-25: 50 ug via INTRAVENOUS
  Administered 2016-07-25: 25 ug via INTRAVENOUS

## 2016-07-25 MED ORDER — LIDOCAINE-EPINEPHRINE (PF) 2 %-1:200000 IJ SOLN
INTRAMUSCULAR | Status: AC
  Filled 2016-07-25: qty 20

## 2016-07-25 MED ORDER — MIDAZOLAM HCL 2 MG/2ML IJ SOLN
INTRAMUSCULAR | Status: AC | PRN
  Administered 2016-07-25 (×6): 1 mg via INTRAVENOUS
  Administered 2016-07-25: 0.5 mg via INTRAVENOUS

## 2016-07-25 MED ORDER — HEPARIN (PORCINE) IN NACL 100-0.45 UNIT/ML-% IJ SOLN
850.0000 [IU]/h | INTRAMUSCULAR | Status: DC
  Administered 2016-07-25 – 2016-07-26 (×2): 850 [IU]/h via INTRAVENOUS
  Filled 2016-07-25: qty 250

## 2016-07-25 MED ORDER — MIDAZOLAM HCL 2 MG/2ML IJ SOLN
INTRAMUSCULAR | Status: AC
  Filled 2016-07-25: qty 6

## 2016-07-25 MED ORDER — LIDOCAINE-EPINEPHRINE (PF) 2 %-1:200000 IJ SOLN
INTRAMUSCULAR | Status: AC | PRN
  Administered 2016-07-25: 10 mL

## 2016-07-25 NOTE — Plan of Care (Signed)
Problem: Bowel/Gastric: Goal: Will not experience complications related to bowel motility Outcome: Not Progressing Pt states he has not had a BM since 5/19.

## 2016-07-25 NOTE — Procedures (Signed)
Pre procedural Dx: Symptomatic compression fracture Post procedural Dx: Same  Technically successful cement augmentation of the L2 vertebral body.  Patient is to lie flat for 3 hours.  EBL: Minimal Complications: None immediate.  Jay Roseland Braun, MD Pager #: [phone]   

## 2016-07-25 NOTE — Progress Notes (Signed)
Patient ID: Eric Rivers, male   DOB: [birth year], 83 y.o.   MRN: [id number]  PROGRESS NOTE    Eric Rivers  GHW:[id number] DOB: Sep 09, 1930 DOA: 07/19/2016 PCP: Biagio Borg, MD   Brief Narrative:  83 y.o.malewith medical history significant of DM-2, HTN, Afib on coumadin, recently diagnosed with L2 compression fracture-presented to the ED for evaluation of intractable back pain.He recently was seen in the ED on and 5/10 and 5/14 for similar problems. He had a mechanical fall earlier this month, with gradual worsening of his low back pain. A MRI LS Spine on 5/14 confirmed a L2 compression fracture, he was started on oral narcotics and other supportive measures, but continued to have pain. He presented back to the ED on 5/19-continued to have pain inspite of narcotics, benzo's, and was also found to have a UTI and coagulopathy. Patient was started on for spectrum antibiotics. Urine culture grew Citrobacter resistant to Rocephin; antibiotic was switched to cefepime on 07/22/2016. Patient is currently on heparin drip. INR is improved. Intervention radiology is following the patient regarding the need for kyphoplasty which is tentatively planned for today, 07/25/2016.  Assessment & Plan:   Principal Problem:   UTI (urinary tract infection) Active Problems:   Diabetes mellitus with neuropathy (HCC)   Chronic atrial fibrillation (HCC)   Hypertension   Hyperlipidemia   Chronic diastolic CHF (congestive heart failure) (HCC)   Warfarin-induced coagulopathy (HCC)   Closed compression fracture of L2 lumbar vertebra (HCC)   Intractable back pain   Intractable low back pain:likelydue to L2 compression fracture. --Lumbar x ray on presentation: Moderate L2 compression fracture age indeterminate, unchanged from the recent CT and new since 06/11/2015. Mild spondylosis of the lumbar spine with disc disease at the L4-5 level. Grade 1 anterolisthesis of L4 on L5 due to the moderate facet  arthropathy. -- Dr Sloan Leiter discussed the case with Dr. Rosalee Kaufman on-call-who suggested that we continue with supportive measures, and if he continues to have pain consult interventional radiology for possible kyphoplasty.  -IR Consulted, patient is deemed to be a candidate for kyphoplasty once INR less than 1.6, and UTI adequated treated, likely Friday. Probable plan for kyphoplasty on 07/25/2016 by IR. We'll keep patient nothing by mouth for this morning - continue brace -Pain management,stool regimen - continue trial of steroid taper - Monitor off fentanyl -PT/OT eval  Coagulopathy:  --INR 5.3 on admission - Resolved. INR is 1.22 today -Coumadin held since admission, iv vitk on 5/21 goal of INR less than 1.6 for IR procedure; continueheparin drip per pharmacy which will be held prior to kyphoplasty -need to confirm with IR about resuming coumadin post procedure   Recurrent UTI:  -CT renal stone:no hydro, no renal stone, Bladder is underdistended. Stable high density material/calcification along the posterior aspect of the bladder (series 2/image 66) extending into a prostate TURP defect (series 2/image 71). -urine culture Citrobacter that is resistant to rocephin, change abx to cipro on 5/22 which was again held because of bradycardia. Continue cefepime for now. Finish a seven-day course of antibiotics therapy.  - CT scan finding was discussed with urology on call Dr Karsten Ro who recommended finish abx treatment and outpatient follow with Dr Tresa Moore within one week of discharge to consider cystoscopyand stone removal.  Chronic Atrial fibrillation: --currently rate controlled without the use of any rate control agent ( h/o bradycardia on betablocker) - Intermittent bradycardia --on coumadin and asa 325 at home, presented with supratherapeutic INR --both held since admission, heparin drip  per pharmacy for now  H/o diastolic chf: euvolemic , close monitor volume status.  Continue lasix  noninsulin dependentType 2 diabetes: Hold oral agents januvia-place on SSI and follow CBGs. Monitor blood sugar While on short course of steroids  Hypertension: follow BP trend. Amlodipine on hold for now. Continue lasix  BPH:s/p TURP in 2014, Continue Flomax/proscar. Outpatient urology follow-up  Dyslipidemia: Continue statin  Gout:Continue allopurinol-no evidence of flare at this time.  Leukocytosis: probably reactive; Repeat a.m. labs  CONSULTS: Neurosurgery Dr Ronnald Ramp by phone on admission IR Urology Dr Karsten Ro over the phone on 5/22  DVT Prophylaxis: Heparin drip  Code Status: DNR  Family Communication:None present at bedside  Disposition Plan:  Probable Discharge to SNF in 2-3 days Procedures: Plan for kyphoplasty today  Antibiotics:  Rocephin from admission to 5/22  cipro from 5/22- 5/22  Cefepime from 5/22>>  Subjective: Patient seen and examined at bedside. He is awake and complains of some back pain. No overnight fever, nausea, vomiting.  Objective: Vitals:   07/24/16 0620 07/24/16 1338 07/24/16 2159 07/25/16 0615  BP: 137/75 129/62 (!) 132/91 (!) 112/54  Pulse: 60 62 60 64  Resp: 16 18  16   Temp: 97.8 F (36.6 C) 98.1 F (36.7 C) 97.6 F (36.4 C) 97.7 F (36.5 C)  TempSrc: Oral Oral  Oral  SpO2: 97% 99% 97% 97%  Weight:      Height:        Intake/Output Summary (Last 24 hours) at 07/25/16 1041 Last data filed at 07/25/16 0616  Gross per 24 hour  Intake              476 ml  Output             1650 ml  Net            -1174 ml   Filed Weights   07/19/16 2009 07/20/16 0826  Weight: 95.3 kg (210 lb) 89.3 kg (196 lb 13.9 oz)    Examination:  General exam: Appears calm and comfortable  Respiratory system: Bilateral decreased breath sound at bases With some scattered crackles Cardiovascular system: S1 & S2 heard, rate controlled  Gastrointestinal system: Abdomen is nondistended, soft and nontender.  Normal bowel sounds heard. Extremities: No cyanosis, clubbing;  trace pedal edema    Data Reviewed: I have personally reviewed following labs and imaging studies  CBC:  Recent Labs Lab 07/20/16 0505 07/21/16 0535 07/23/16 1938 07/24/16 0519 07/25/16 0527  WBC 12.2* 10.4 12.3* 12.4* 11.8*  NEUTROABS 9.7*  --   --   --  10.4*  HGB 13.2 13.6 12.5* 12.5* 12.7*  HCT 40.6 41.7 38.1* 38.1* 38.6*  MCV 95.3 96.5 94.8 95.3 94.8  PLT 197 196 219 183 409   Basic Metabolic Panel:  Recent Labs Lab 07/21/16 0535 07/22/16 0531 07/23/16 0019 07/24/16 0519 07/25/16 0527  NA 139 137 135 135 136  K 3.4* 3.6 4.4 4.6 4.5  CL 99* 99* 102 103 98*  CO2 29 30 26 26 29   GLUCOSE 114* 126* 171* 127* 167*  BUN 13 15 19  23* 23*  CREATININE 0.96 0.93 0.99 0.90 0.93  CALCIUM 9.1 9.2 9.0 9.5 9.6  MG  --  1.7  --   --   --    GFR: Estimated Creatinine Clearance: 64.2 mL/min (by C-G formula based on SCr of 0.93 mg/dL). Liver Function Tests: No results for input(s): AST, ALT, ALKPHOS, BILITOT, PROT, ALBUMIN in the last 168 hours. No results for input(s): LIPASE, AMYLASE  in the last 168 hours. No results for input(s): AMMONIA in the last 168 hours. Coagulation Profile:  Recent Labs Lab 07/21/16 0535 07/22/16 0531 07/23/16 0019 07/24/16 0519 07/25/16 0527  INR 4.93* 1.60 1.29 1.21 1.22   Cardiac Enzymes: No results for input(s): CKTOTAL, CKMB, CKMBINDEX, TROPONINI in the last 168 hours. BNP (last 3 results)  Recent Labs  09/07/15 1107  PROBNP 566.0*   HbA1C: No results for input(s): HGBA1C in the last 72 hours. CBG:  Recent Labs Lab 07/24/16 0726 07/24/16 1133 07/24/16 1642 07/24/16 2156 07/25/16 0737  GLUCAP 118* 158* 163* 203* 151*   Lipid Profile: No results for input(s): CHOL, HDL, LDLCALC, TRIG, CHOLHDL, LDLDIRECT in the last 72 hours. Thyroid Function Tests: No results for input(s): TSH, T4TOTAL, FREET4, T3FREE, THYROIDAB in the last 72 hours. Anemia Panel: No  results for input(s): VITAMINB12, FOLATE, FERRITIN, TIBC, IRON, RETICCTPCT in the last 72 hours. Sepsis Labs: No results for input(s): PROCALCITON, LATICACIDVEN in the last 168 hours.  Recent Results (from the past 240 hour(s))  Urine culture     Status: None   Collection Time: 07/15/16 11:52 AM  Result Value Ref Range Status   Organism ID, Bacteria NO GROWTH  Final  Urine culture     Status: Abnormal   Collection Time: 07/19/16  8:24 PM  Result Value Ref Range Status   Specimen Description URINE, CLEAN CATCH  Final   Special Requests NONE  Final   Culture 20,000 COLONIES/mL CITROBACTER FREUNDII (A)  Final   Report Status 07/22/2016 FINAL  Final   Organism ID, Bacteria CITROBACTER FREUNDII (A)  Final      Susceptibility   Citrobacter freundii - MIC*    CEFAZOLIN >=64 RESISTANT Resistant     CEFEPIME <=1 SENSITIVE Sensitive     CEFTRIAXONE >=64 RESISTANT Resistant     CIPROFLOXACIN <=0.25 SENSITIVE Sensitive     GENTAMICIN <=1 SENSITIVE Sensitive     IMIPENEM <=0.25 SENSITIVE Sensitive     NITROFURANTOIN <=16 SENSITIVE Sensitive     TRIMETH/SULFA <=20 SENSITIVE Sensitive     PIP/TAZO 64 INTERMEDIATE Intermediate     * 20,000 COLONIES/mL CITROBACTER FREUNDII  Culture, blood (routine x 2)     Status: None (Preliminary result)   Collection Time: 07/22/16  4:43 PM  Result Value Ref Range Status   Specimen Description BLOOD LEFT ANTECUBITAL  Final   Special Requests   Final    BOTTLES DRAWN AEROBIC AND ANAEROBIC Blood Culture results may not be optimal due to an excessive volume of blood received in culture bottles   Culture   Final    NO GROWTH 2 DAYS Performed at [facility], [place] [address] [place], [place] [address]    Report Status PENDING  Incomplete  Culture, blood (routine x 2)     Status: None (Preliminary result)   Collection Time: 07/22/16  4:43 PM  Result Value Ref Range Status   Specimen Description BLOOD LEFT HAND  Final   Special Requests IN PEDIATRIC  BOTTLE Blood Culture adequate volume  Final   Culture   Final    NO GROWTH 2 DAYS Performed at [facility], [place] [address], [place], [place] [address]    Report Status PENDING  Incomplete         Radiology Studies: No results found.      Scheduled Meds: . acetaminophen  1,000 mg Oral Q8H  . allopurinol  300 mg Oral Daily  . atorvastatin  20 mg Oral q1800  .  calcium-vitamin D  1 tablet Oral QHS  . ferrous sulfate  325 mg Oral Q breakfast  . finasteride  5 mg Oral q morning - 10a  . furosemide  40 mg Oral Daily  . gabapentin  100 mg Oral TID  . insulin aspart  0-9 Units Subcutaneous TID WC  . multivitamin with minerals  1 tablet Oral Daily  . pantoprazole  40 mg Oral Daily  . polyethylene glycol  17 g Oral Daily  . potassium chloride  10 mEq Oral Daily  . [START ON 07/26/2016] predniSONE  10 mg Oral Q breakfast  . senna-docusate  1 tablet Oral BID  . sodium chloride flush  3 mL Intravenous Q12H  . tamsulosin  0.4 mg Oral QHS  . traZODone  50 mg Oral QHS   Continuous Infusions: . sodium chloride    . ceFEPime (MAXIPIME) IV 2 g (07/25/16 1013)  . heparin 850 Units/hr (07/24/16 1213)     LOS: 5 days        Aline August, MD [facility] Pager [phone]  If 7PM-7AM, please contact night-coverage [email or web address] Password [facility] 07/25/2016, 10:41 AM

## 2016-07-25 NOTE — Progress Notes (Signed)
PT Cancellation Note  Patient Details Name: Eric Rivers MRN: [id number] DOB: 1931/01/18   Cancelled Treatment:    Reason Eval/Treat Not Completed: Patient at procedure or test/unavailable;Pain limiting ability to participate. Unable to work with pt all week due to pain. Pt is tentatively sheduled for kyphoplasty on today. Will hold PT for now and follow up after procedure. Thanks.    Weston Anna, MPT Pager: [phone]

## 2016-07-25 NOTE — Progress Notes (Signed)
Pharmacy: Re- Heparin  S/p kyphoplasty today (07/25/16).  Per Dr. Pascal Lux, to resume heparin drip back at 8 PM tonight.  Plan: - resume heparin drip at 850 units/hr (no bolus) at 8PM - f/u with AM labs and adjust rate if needed - monitor for s/s bleeding  Dia Sitter, PharmD, BCPS 07/25/2016 5:16 PM

## 2016-07-25 NOTE — Care Management Important Message (Signed)
Important Message  Patient Details  Name: Eric Rivers MRN: [id number] Date of Birth: [birth year]   Medicare Important Message Given:  Yes    Kerin Salen 07/25/2016, 11:45 AMImportant Message  Patient Details  Name: [name] MRN: [id number] Date of Birth: [birth year]   Medicare Important Message Given:  Yes    Kerin Salen 07/25/2016, 11:45 AM

## 2016-07-25 NOTE — Progress Notes (Signed)
[place] for Heparin Indication: atrial fibrillation  Allergies  Allergen Reactions  . Metoprolol Other (See Comments)    Reaction:  Bradycardia with 68m BID dose    Patient Measurements: Height: _0  (172.7 cm) Weight: 196 lb 13.9 oz (89.3 kg) IBW/kg (Calculated) : 68.4 Heparin Dosing Weight: 87 kg  Vital Signs: Temp: 97.7 F (36.5 C) (05/25 0615) Temp Source: Oral (05/25 0615) BP: 112/54 (05/25 0615) Pulse Rate: 64 (05/25 0615)  Labs:  Recent Labs  07/23/16 0019  07/23/16 1938 07/24/16 0519 07/25/16 0527  HGB  --   < > 12.5* 12.5* 12.7*  HCT  --   --  38.1* 38.1* 38.6*  PLT  --   --  219 183 216  LABPROT 16.2*  --   --  15.4* 15.5*  INR 1.29  --   --  1.21 1.22  HEPARINUNFRC 0.55  < > 0.45 0.36 0.40  CREATININE 0.99  --   --  0.90 0.93  < > = values in this interval not displayed.  Estimated Creatinine Clearance: 64.2 mL/min (by C-G formula based on SCr of 0.93 mg/dL).   Medical History: Past Medical History:  Diagnosis Date  . ACE-inhibitor cough   . Aortic dissection ([facility]    Surgical repair February, 2014  . Aortic stenosis    mild....echo... september... 2010/ mild... echo..[name], 2011  . Atrial fibrillation (HCC)     Chronic,   24 hour holter, September, 2011.... atrial fib rate is controlled.... there is some bradycardia but  no marked pauses  . Bladder cancer ([facility]    recurrence with TUR-B March '09  . DDD (degenerative disc disease)    lumbar spine  . Diabetes mellitus    type 2  . Diabetes mellitus with neuropathy [place] 12/04/2006   Qualifier: Diagnosis of  By: NLinda HedgesMD, MHeinz Knuckles medications - DPP4   . Diverticulosis of colon with hemorrhage 01/01/2014  . Ejection fraction    EF 60%, echo, 01/2010  . Fluid overload   . Fracture of metacarpal bone 11/30/11  . GERD (gastroesophageal reflux disease)   . Gout   . History of echocardiogram    Echo 5/17 - mild LVH, EF 55-60%, mod AS (mean 16 mmHg, peak  29 mmHg), MAC, midl MR, massive BAE, mod dilated RV, low normal RVSF, mod TR, PASP 64 mmHg  . Hx of colonoscopy    approx. 10 years  . Hypercholesterolemia   . Hyperlipidemia   . Hypertension   . Mitral regurgitation   . Normal nuclear stress test    06/2005 , also ABI normal 2007  . [place]    radiation tx.  . Pulmonary hypertension ([place]    432mg, echo, 01/2010  . Shortness of breath    on exertion  . Warfarin anticoagulation    Atrial fib  . Whooping cough      Assessment:  [age]r male admitted on 5/20 with low back pain (xray shows L2 compression fracture).  PMH significant for AFib and patient on warfarin PTA and UTI (recently completed course of antibiotics).  Admitting INR = 5.31 with warfarin regimen of 16m69maily except 6mg80m Thursdays (last dose taken 5/19)  INR (5/21) = 4.93;  Vitamin K 10mg76mx 1 given 5/21  Plan for kyphoplasty once INR < 1.6  IV heparin per pharmacy to begin when INR < 2  Today, 07/25/2016  Heparin level 0.4, therapeutic on 850 units/hr  INR down to  1.22  No bleeding reported  Kyphoplasty planned for this afternoon  Goal of Therapy:  Heparin level 0.3-0.7 units/ml Monitor platelets by anticoagulation protocol: Yes   Plan:   Continue IV heparin at 850 units/hr  Daily heparin level & CBC while on heparin   Kyphoplasty planned for 5/25 PM- stopping heparin at 12:30 (2hr prior)  Need to f/u with IR about resuming heparin/coumadin post procedure  Peggyann Juba, PharmD, BCPS Pager: [phone] 07/25/2016,7:48 AM

## 2016-07-26 DIAGNOSIS — I5032 Chronic diastolic (congestive) heart failure: Secondary | ICD-10-CM

## 2016-07-26 LAB — BASIC METABOLIC PANEL
Anion gap: 11 (ref 5–15)
BUN: 22 mg/dL — AB (ref 4–21)
BUN: 22 mg/dL — AB (ref 6–20)
CALCIUM: 9.7 mg/dL (ref 8.9–10.3)
CHLORIDE: 100 mmol/L — AB (ref 101–111)
CO2: 28 mmol/L (ref 22–32)
CREATININE: 0.77 mg/dL (ref 0.61–1.24)
Creatinine: 0.8 mg/dL (ref 0.6–1.3)
GLUCOSE: 112 mg/dL
Glucose, Bld: 112 mg/dL — ABNORMAL HIGH (ref 65–99)
Potassium: 3.5 mmol/L (ref 3.4–5.3)
Potassium: 3.5 mmol/L (ref 3.5–5.1)
SODIUM: 139 mmol/L (ref 137–147)
Sodium: 139 mmol/L (ref 135–145)

## 2016-07-26 LAB — HEPARIN LEVEL (UNFRACTIONATED)
HEPARIN UNFRACTIONATED: 0.27 [IU]/mL — AB (ref 0.30–0.70)
HEPARIN UNFRACTIONATED: 0.37 [IU]/mL (ref 0.30–0.70)

## 2016-07-26 LAB — POCT INR: INR: 1.2 — AB (ref 0.9–1.1)

## 2016-07-26 LAB — MAGNESIUM: MAGNESIUM: 1.7 mg/dL (ref 1.7–2.4)

## 2016-07-26 LAB — GLUCOSE, CAPILLARY
GLUCOSE-CAPILLARY: 122 mg/dL — AB (ref 65–99)
GLUCOSE-CAPILLARY: 192 mg/dL — AB (ref 65–99)
GLUCOSE-CAPILLARY: 155 mg/dL — AB (ref 65–99)
Glucose-Capillary: 99 mg/dL (ref 65–99)

## 2016-07-26 LAB — CBC
HEMATOCRIT: 39.6 % (ref 39.0–52.0)
Hemoglobin: 13 g/dL (ref 13.0–17.0)
MCH: 31 pg (ref 26.0–34.0)
MCHC: 32.8 g/dL (ref 30.0–36.0)
MCV: 94.3 fL (ref 78.0–100.0)
Platelets: 207 10*3/uL (ref 150–400)
RBC: 4.2 MIL/uL — ABNORMAL LOW (ref 4.22–5.81)
RDW: 15.3 % (ref 11.5–15.5)
WBC: 9.9 10*3/uL (ref 4.0–10.5)

## 2016-07-26 LAB — CBC AND DIFFERENTIAL
HCT: 40 % — AB (ref 41–53)
Hemoglobin: 13 g/dL — AB (ref 13.5–17.5)
PLATELETS: 207 10*3/uL (ref 150–399)
WBC: 9.9 10*3/mL

## 2016-07-26 LAB — PROTIME-INR
INR: 1.21
PROTHROMBIN TIME: 15.3 s — AB (ref 11.4–15.2)
Protime: 15.3 seconds — AB (ref 10.0–13.8)

## 2016-07-26 MED ORDER — WARFARIN - PHARMACIST DOSING INPATIENT: Freq: Every day | Status: DC

## 2016-07-26 MED ORDER — WARFARIN SODIUM 5 MG PO TABS
5.0000 mg | ORAL_TABLET | Freq: Once | ORAL | Status: AC
  Administered 2016-07-26: 5 mg via ORAL
  Filled 2016-07-26: qty 1

## 2016-07-26 MED ORDER — CIPROFLOXACIN HCL 500 MG PO TABS
500.0000 mg | ORAL_TABLET | Freq: Two times a day (BID) | ORAL | Status: DC
  Administered 2016-07-26 – 2016-07-28 (×4): 500 mg via ORAL
  Filled 2016-07-26 (×4): qty 1

## 2016-07-26 MED ORDER — HEPARIN (PORCINE) IN NACL 100-0.45 UNIT/ML-% IJ SOLN
900.0000 [IU]/h | INTRAMUSCULAR | Status: DC
  Administered 2016-07-27 – 2016-07-28 (×2): 900 [IU]/h via INTRAVENOUS
  Filled 2016-07-26 (×2): qty 250

## 2016-07-26 NOTE — Progress Notes (Signed)
[place] for Heparin/Coumadin Indication: atrial fibrillation  Allergies  Allergen Reactions  . Metoprolol Other (See Comments)    Reaction:  Bradycardia with 25mg  BID dose    Patient Measurements: Height: 5\' 8"  (172.7 cm) Weight: 196 lb 13.9 oz (89.3 kg) IBW/kg (Calculated) : 68.4 Heparin Dosing Weight: 87 kg  Vital Signs: Temp: 98 F (36.7 C) (05/26 1300) Temp Source: Oral (05/26 1300) BP: 123/69 (05/26 1300) Pulse Rate: 60 (05/26 1300)  Labs:  Recent Labs  07/24/16 0519 07/25/16 0527 07/26/16 0537 07/26/16 1541  HGB 12.5* 12.7* 13.0  --   HCT 38.1* 38.6* 39.6  --   PLT 183 216 207  --   LABPROT 15.4* 15.5* 15.3*  --   INR 1.21 1.22 1.21  --   HEPARINUNFRC 0.36 0.40 0.27* 0.37  CREATININE 0.90 0.93 0.77  --     Estimated Creatinine Clearance: 74.7 mL/min (by C-G formula based on SCr of 0.77 mg/dL).  Assessment: 83 yr male admitted on 5/20 with low back pain (xray shows L2 compression fracture).  PMH significant for AFib and patient on warfarin PTA and UTI (recently completed course of antibiotics).  Admitting INR = 5.31 with warfarin regimen of 4mg  daily except 6mg  on Thursdays (last dose taken 5/19)   Baseline INR (5/21) = 4.93;  Vitamin K 10mg  IV x 1 given 5/21  Significant events:  Started heparin 5/22  Heparin held 5/25 for kyphoplasty, resumed in PM  Ok to resume coumadin 526  Today, 07/26/2016  Heparin level now subtherapeutic (0.27) on 850 units/hr - patient had previously been therapeutic on this rate for several days  Currently in AFib per RN  INR remains subtherapeutic off warfarin  No bleeding or line issues reported  At 15:41, heparin level therapeutic (0.37) on 900 units/hr  Goal of Therapy:  Heparin level 0.3-0.7 units/ml Monitor platelets by anticoagulation protocol: Yes   Plan:   Continue heparin at 900 units/hr  Resume coumadin 5mg  PO x 1 at 18:00 today - monitor closely for  interaction with cipro  Heparin level, CBC and INR daily  Monitor for signs of bleeding or thrombosis  Reuel Boom, PharmD, BCPS Pager: [phone] 07/26/2016, 4:32 PM

## 2016-07-26 NOTE — Progress Notes (Signed)
Patient ID: Eric Rivers, male   DOB: 01-19-1931, 83 y.o.   MRN: [id number]  PROGRESS NOTE    JP EASTHAM  SHF:[id number] DOB: 1930/03/04 DOA: 07/19/2016 PCP: Biagio Borg, MD   Brief Narrative:  83 y.o.malewith medical history significant of DM-2, HTN, Afib on coumadin, recently diagnosed with L2 compression fracture-presented to the ED for evaluation of intractable back pain.He recently was seen in the ED on and 5/10 and 5/14 for similar problems. He had a mechanical fall earlier this month, with gradual worsening of his low back pain. A MRI LS Spine on 5/14 confirmed a L2 compression fracture, he was started on oral narcotics and other supportive measures, but continued to have pain. He presented back to the ED on 5/19-continued to have pain inspite of narcotics, benzo's, and was also found to have a UTI and coagulopathy. Patient was started on for spectrum antibiotics. Urine culture grew Citrobacter resistant to Rocephin; antibiotic was switched to cefepime on 07/22/2016. Patient is currently on heparin drip. INR is improved. The patient had kyphoplasty done by interventional radiology on 07/25/2016. His pain is improving.   Assessment & Plan:   Principal Problem:   UTI (urinary tract infection) Active Problems:   Diabetes mellitus with neuropathy (HCC)   Chronic atrial fibrillation (HCC)   Hypertension   Hyperlipidemia   Chronic diastolic CHF (congestive heart failure) (HCC)   Warfarin-induced coagulopathy (HCC)   Closed compression fracture of L2 lumbar vertebra (HCC)   Intractable back pain    Intractable low back pain:likelydue to L2 compression fracture. --Lumbar x ray on presentation: Moderate L2 compression fracture age indeterminate, unchanged from the recent CT and new since 06/11/2015. Mild spondylosis of the lumbar spine with disc disease at the L4-5 level. Grade 1 anterolisthesis of L4 on L5 due to the moderate facet arthropathy. -- Dr Sloan Leiter discussed the  case with Dr. Rosalee Kaufman on-call-who suggested that we continue with supportive measures, and if he continues to have pain consult interventional radiology for possible kyphoplasty.  -Status post kyphoplasty done by IR on 07/25/2016. Follow up with IR as an outpatient - continue brace if needed -Pain management,stool regimen - continue trial of steroid taper - Monitor off fentanyl -PT/OT eval  Coagulopathy:  --INR 5.3 on admission - Resolved. INR is 1.21today -Coumadin held since admission, iv vitk on 5/21 goal of INR less than 1.6 for IR procedure; currently on heparin drip. - I spoke to Dr. Pascal Lux from interventional radiology and he's okay for the Coumadin to be restarted from today.Hence we'll restart Coumadin today dosed as per pharmacy  Recurrent UTI:  -CT renal stone:no hydro, no renal stone, Bladder is underdistended. Stable high density material/calcification along the posterior aspect of the bladder (series 2/image 66) extending into a prostate TURP defect (series 2/image 71). -urine culture Citrobacter that is resistant to rocephin, changed abx to cipro on 5/22 which was again held because of bradycardia. Currently on cefepime. Will switch the patient back to ciprofloxacin to finish seven-day course - CT scan finding was discussed with urology on call Dr Karsten Ro who recommended finish abx treatment and outpatient follow with Dr Tresa Moore within one week of discharge to consider cystoscopyand stone removal.  Chronic Atrial fibrillation: --currently rate controlled without the use of any rate control agent ( h/o bradycardia on betablocker) - Restart Coumadin today  H/o diastolic chf: euvolemic , close monitor volume status. Continue lasix  noninsulin dependentType 2 diabetes: Hold oral agents januvia-place on SSI and follow CBGs. Monitor blood sugar  While on short course of steroids  Hypertension: follow BP trend. Amlodipine on hold for now. Continue  lasix  BPH:s/p TURP in 2014, Continue Flomax/proscar. Outpatient urology follow-up  Dyslipidemia: Continue statin  Gout:Continue allopurinol-no evidence of flare at this time.  Leukocytosis:probably reactive; resolved CONSULTS: Neurosurgery Dr Ronnald Ramp by phone on admission IR Urology Dr Karsten Ro over the phone on 5/22  DVT Prophylaxis: Heparin drip  Code Status: DNR  Family Communication:None present at bedside  Disposition Plan: Probable Discharge to SNF in 1-2days Procedures: Kyphoplasty on 07/05/2016  Antibiotics:  Rocephin from admission to 5/22  cipro from 5/22- 5/22  Cefepime from 5/22>>  Subjective: Patient seen and examined at bedside. He feels better after his procedure yesterday. His pain is controlled. No overnight fever, nausea, vomiting  Objective: Vitals:   07/25/16 1555 07/25/16 1611 07/25/16 2216 07/26/16 0509  BP: 120/80 120/73 100/65 116/69  Pulse: 80 73 66 60  Resp: (!) 27 12 18 16   Temp:  97.8 F (36.6 C) 98.6 F (37 C) 97.5 F (36.4 C)  TempSrc:   Oral Oral  SpO2: 100% 94% 97% 96%  Weight:      Height:        Intake/Output Summary (Last 24 hours) at 07/26/16 1038 Last data filed at 07/26/16 0900  Gross per 24 hour  Intake           421.32 ml  Output             1475 ml  Net         -1053.68 ml   Filed Weights   07/19/16 2009 07/20/16 0826  Weight: 95.3 kg (210 lb) 89.3 kg (196 lb 13.9 oz)    Examination:  General exam: Appears calm and comfortable  Respiratory system: Bilateral decreased breath sound at bases With scattered crackles Cardiovascular system: S1 & S2 heard, rate controlled  Gastrointestinal system: Abdomen is nondistended, soft and nontender. Normal bowel sounds heard. Extremities: No cyanosis, clubbing, trace pedal edema  Data Reviewed: I have personally reviewed following labs and imaging studies  CBC:  Recent Labs Lab 07/20/16 0505 07/21/16 0535 07/23/16 1938 07/24/16 0519  07/25/16 0527 07/26/16 0537  WBC 12.2* 10.4 12.3* 12.4* 11.8* 9.9  NEUTROABS 9.7*  --   --   --  10.4*  --   HGB 13.2 13.6 12.5* 12.5* 12.7* 13.0  HCT 40.6 41.7 38.1* 38.1* 38.6* 39.6  MCV 95.3 96.5 94.8 95.3 94.8 94.3  PLT 197 196 219 183 216 503   Basic Metabolic Panel:  Recent Labs Lab 07/22/16 0531 07/23/16 0019 07/24/16 0519 07/25/16 0527 07/26/16 0537  NA 137 135 135 136 139  K 3.6 4.4 4.6 4.5 3.5  CL 99* 102 103 98* 100*  CO2 30 26 26 29 28   GLUCOSE 126* 171* 127* 167* 112*  BUN 15 19 23* 23* 22*  CREATININE 0.93 0.99 0.90 0.93 0.77  CALCIUM 9.2 9.0 9.5 9.6 9.7  MG 1.7  --   --   --  1.7   GFR: Estimated Creatinine Clearance: 74.7 mL/min (by C-G formula based on SCr of 0.77 mg/dL). Liver Function Tests: No results for input(s): AST, ALT, ALKPHOS, BILITOT, PROT, ALBUMIN in the last 168 hours. No results for input(s): LIPASE, AMYLASE in the last 168 hours. No results for input(s): AMMONIA in the last 168 hours. Coagulation Profile:  Recent Labs Lab 07/22/16 0531 07/23/16 0019 07/24/16 0519 07/25/16 0527 07/26/16 0537  INR 1.60 1.29 1.21 1.22 1.21   Cardiac Enzymes: No results  for input(s): CKTOTAL, CKMB, CKMBINDEX, TROPONINI in the last 168 hours. BNP (last 3 results)  Recent Labs  09/07/15 1107  PROBNP 566.0*   HbA1C: No results for input(s): HGBA1C in the last 72 hours. CBG:  Recent Labs Lab 07/25/16 0737 07/25/16 1152 07/25/16 1704 07/25/16 2214 07/26/16 0755  GLUCAP 151* 163* 172* 159* 99   Lipid Profile: No results for input(s): CHOL, HDL, LDLCALC, TRIG, CHOLHDL, LDLDIRECT in the last 72 hours. Thyroid Function Tests: No results for input(s): TSH, T4TOTAL, FREET4, T3FREE, THYROIDAB in the last 72 hours. Anemia Panel: No results for input(s): VITAMINB12, FOLATE, FERRITIN, TIBC, IRON, RETICCTPCT in the last 72 hours. Sepsis Labs: No results for input(s): PROCALCITON, LATICACIDVEN in the last 168 hours.  Recent Results (from the past  240 hour(s))  Urine culture     Status: Abnormal   Collection Time: 07/19/16  8:24 PM  Result Value Ref Range Status   Specimen Description URINE, CLEAN CATCH  Final   Special Requests NONE  Final   Culture 20,000 COLONIES/mL CITROBACTER FREUNDII (A)  Final   Report Status 07/22/2016 FINAL  Final   Organism ID, Bacteria CITROBACTER FREUNDII (A)  Final      Susceptibility   Citrobacter freundii - MIC*    CEFAZOLIN >=64 RESISTANT Resistant     CEFEPIME <=1 SENSITIVE Sensitive     CEFTRIAXONE >=64 RESISTANT Resistant     CIPROFLOXACIN <=0.25 SENSITIVE Sensitive     GENTAMICIN <=1 SENSITIVE Sensitive     IMIPENEM <=0.25 SENSITIVE Sensitive     NITROFURANTOIN <=16 SENSITIVE Sensitive     TRIMETH/SULFA <=20 SENSITIVE Sensitive     PIP/TAZO 64 INTERMEDIATE Intermediate     * 20,000 COLONIES/mL CITROBACTER FREUNDII  Culture, blood (routine x 2)     Status: None (Preliminary result)   Collection Time: 07/22/16  4:43 PM  Result Value Ref Range Status   Specimen Description BLOOD LEFT ANTECUBITAL  Final   Special Requests   Final    BOTTLES DRAWN AEROBIC AND ANAEROBIC Blood Culture results may not be optimal due to an excessive volume of blood received in culture bottles   Culture   Final    NO GROWTH 3 DAYS Performed at [facility], [address], [place], [place] [address]    Report Status PENDING  Incomplete  Culture, blood (routine x 2)     Status: None (Preliminary result)   Collection Time: 07/22/16  4:43 PM  Result Value Ref Range Status   Specimen Description BLOOD LEFT HAND  Final   Special Requests IN PEDIATRIC BOTTLE Blood Culture adequate volume  Final   Culture   Final    NO GROWTH 3 DAYS Performed at [facility], [place] [address] [place], [place] [address]    Report Status PENDING  Incomplete         Radiology Studies: Ir Kypho Lumbar Inc Fx Reduce Bone Bx Uni/bil Cannulation Inc/imaging  Result Date: 07/25/2016 CLINICAL DATA:  Symptomatic L2  compression fracture. EXAM: FLUOROSCOPIC GUIDED KYPHOPLASTY OF THE L2 VERTEBRAL BODY COMPARISON:  CT abdomen pelvis - 07/19/2016; 07/10/2016; lumbar spine MRI - 07/14/2016 MEDICATIONS: The patient is currently admitted to the hospital and receiving intravenous antibiotics. Antibiotics were administered with an appropriate time frame prior to this skin puncture. ANESTHESIA/SEDATION: Moderate (conscious) sedation was employed during this procedure. A total of Versed 6.5 mg and Fentanyl 150 mcg was administered intravenously. Moderate Sedation Time: 57 minutes. The patient's level of consciousness and vital signs were monitored continuously  by radiology nursing throughout the procedure under my direct supervision. FLUOROSCOPY TIME:  14 min, 6 seconds (0277 mGy) COMPLICATIONS: None immediate. TECHNIQUE: The procedure, risks (including but not limited to bleeding, infection, organ damage), benefits, and alternatives were explained to the patient. Questions regarding the procedure were encouraged and answered. The patient understands and consents to the procedure. The patient was placed prone on the fluoroscopic table. The skin overlying the upper thoracic region was then prepped and draped in the usual sterile fashion. Maximal barrier sterile technique was utilized including caps, mask, sterile gowns, sterile gloves, sterile drape, hand hygiene and skin antiseptic. Intravenous Fentanyl and Versed were administered as conscious sedation during continuous cardiorespiratory monitoring by the radiology RN. The left pedicle at L2 was then infiltrated with 1% lidocaine followed by the advancement of a Kyphon trocar needle through the left pedicle into the posterior one-third of the vertebral body. Subsequently, the osteo drill was advanced to the anterior third of the vertebral body. The osteo drill was retracted. Through the working cannula, a Kyphon inflatable bone tamp 15 x 3 was advanced and positioned with the distal  marker approximately 5 mm from the anterior aspect of the cortex. Appropriate positioning was confirmed on the AP projection. At this time, the balloon was expanded using contrast via a Kyphon inflation syringe device via micro tubing. In similar fashion, the right L2 pedicle was infiltrated with 1% lidocaine followed by the advancement of a second Kyphon trocar needle through the right pedicle into the posterior third of the vertebral body. Subsequently, the osteo drill was coaxially advanced to the anterior right third. The osteo drill was exchanged for a Kyphon inflatable bone tamp 15 x 3, advanced to the 5 mm of the anterior aspect of the cortex. The balloon was then expanded using contrast as above. Inflations were continued until there was near apposition with the superior end plate. At this time, methylmethacrylate mixture was reconstituted in the Kyphon bone mixing device system. This was then loaded into the delivery mechanism, attached to Kyphon bone fillers. The balloons were deflated and removed followed by the instillation of methylmethacrylate mixture with excellent filling in the AP and lateral projections. No extravasation was noted in the disk spaces or posteriorly into the spinal canal. No epidural venous contamination was seen. The working cannulae and the bone filler were then retrieved and removed. Hemostasis was achieved with manual compression. The patient tolerated the procedure well without immediate postprocedural complication. IMPRESSION: 1. Technically successful L2 vertebral body augmentation using balloon kyphoplasty. 2. Per CMS PQRS reporting requirements (PQRS Measure 24): Given the patient's age of greater than 56 and the fracture site (hip, distal radius, or spine), the patient should be tested for osteoporosis using DXA, and the appropriate treatment considered based on the DXA results. Electronically Signed   By: Sandi Mariscal M.D.   On: 07/25/2016 16:54        Scheduled  Meds: . acetaminophen  1,000 mg Oral Q8H  . allopurinol  300 mg Oral Daily  . atorvastatin  20 mg Oral q1800  . calcium-vitamin D  1 tablet Oral QHS  . ciprofloxacin  500 mg Oral BID  . ferrous sulfate  325 mg Oral Q breakfast  . finasteride  5 mg Oral q morning - 10a  . furosemide  40 mg Oral Daily  . gabapentin  100 mg Oral TID  . insulin aspart  0-9 Units Subcutaneous TID WC  . multivitamin with minerals  1 tablet Oral Daily  .  pantoprazole  40 mg Oral Daily  . polyethylene glycol  17 g Oral Daily  . potassium chloride  10 mEq Oral Daily  . senna-docusate  1 tablet Oral BID  . sodium chloride flush  3 mL Intravenous Q12H  . tamsulosin  0.4 mg Oral QHS  . traZODone  50 mg Oral QHS   Continuous Infusions: . sodium chloride    . heparin 900 Units/hr (07/26/16 0800)     LOS: 6 days        Aline August, MD [facility] Pager [phone]  If 7PM-7AM, please contact night-coverage [email or web address] Password [facility] 07/26/2016, 10:38 AM

## 2016-07-26 NOTE — Discharge Instructions (Signed)
Balloon Kyphoplasty, Care After Refer to this sheet in the next few weeks. These instructions provide you with information about caring for yourself after your procedure. Your health care provider may also give you more specific instructions. Your treatment has been planned according to current medical practices, but problems sometimes occur. Call your health care provider if you have any problems or questions after your procedure. What can I expect after the procedure? After your procedure, it is common to have back pain. Follow these instructions at home: Incision care   Follow instructions from your health care provider about how to take care of your incisions. Make sure you:  Wash your hands with soap and water before you change your bandage (dressing). If soap and water are not available, use hand sanitizer.  Change your dressing as told by your health care provider.  Leave stitches (sutures), skin glue, or adhesive strips in place. These skin closures may need to be in place for 2 weeks or longer. If adhesive strip edges start to loosen and curl up, you may trim the loose edges. Do not remove adhesive strips completely unless your health care provider tells you to do that.  Check your incision area every day for signs of infection. Watch for:  Redness, swelling, or pain.  Fluid, blood, or pus.  Keep your dressing dry until your health care provider says that it can be removed. Activity    Rest your back and avoid intense physical activity for as long as told by your health care provider.  Return to your normal activities as told by your health care provider. Ask your health care provider what activities are safe for you.  Do not lift anything that is heavier than 10 lb (4.5 kg). This is about the weight of a gallon of milk.You may need to avoid heavy lifting for several weeks. General instructions   Take over-the-counter and prescription medicines only as told by your health  care provider.  If directed, apply ice to the painful area:  Put ice in a plastic bag.  Place a towel between your skin and the bag.  Leave the ice on for 20 minutes, 2-3 times per day.  Do not use tobacco products, including cigarettes, chewing tobacco, or e-cigarettes. If you need help quitting, ask your health care provider.  Keep all follow-up visits as told by your health care provider. This is important. Contact a health care provider if:  You have a fever.  You have redness, swelling, or pain at the site of your incisions.  You have fluid, blood, or pus coming from your incisions.  You have pain that gets worse or does not get better with medicine.  You develop numbness or weakness in any part of your body. Get help right away if:  You have chest pain.  You have difficulty breathing.  You cannot move your legs.  You cannot control your bladder or bowel movements.  You suddenly become weak or numb on one side of your body.  You become very confused.  You have trouble speaking or understanding, or both. This information is not intended to replace advice given to you by your health care provider. Make sure you discuss any questions you have with your health care provider. Document Released: 11/08/2014 Document Revised: 07/26/2015 Document Reviewed: 06/12/2014 Elsevier Interactive Patient Education  2017 [employer].

## 2016-07-26 NOTE — Progress Notes (Signed)
Physical Therapy Treatment Patient Details Name: Eric Rivers MRN: [id number] DOB: 02/18/1931 Today's Date: 07/26/2016    History of Present Illness 83 yo male admitted with difficulty ambulating, back pain, L2 compression fx, UTI. Hx of DM, HTN, A fib. S/P L2 kyphoplasty 07/25/16    PT Comments    Pt had KP on 07/25/16. Pt participated well with therapy on today. He did not wear TLSO. Pain rated 5/10 with activity. Recommend ST rehab at SNF if pt is agreeable.    Follow Up Recommendations  SNF     Equipment Recommendations  Rolling walker with 5" wheels    Recommendations for Other Services       Precautions / Restrictions Precautions Precautions: Fall Required Braces or Orthoses: Spinal Brace Spinal Brace: Thoracolumbosacral orthotic (for comfort, IF pt wants to wear it) Restrictions Weight Bearing Restrictions: No    Mobility  Bed Mobility Overal bed mobility: Needs Assistance Bed Mobility: Supine to Sit     Supine to sit: Min assist;HOB elevated     General bed mobility comments: Assist for trunk to full upright, scooting to EOB. Increased time. Pt relied on bedrail. Utilized bedpad to aid with scooting.   Transfers Overall transfer level: Needs assistance Equipment used: Rolling walker (2 wheeled) Transfers: Sit to/from Stand Sit to Stand: Min assist;From elevated surface         General transfer comment: Assist to rise, stabilize, control descent. VCs safety, technique, hand placement.   Ambulation/Gait Ambulation/Gait assistance: Min assist Ambulation Distance (Feet): 75 Feet Assistive device: Rolling walker (2 wheeled) Gait Pattern/deviations: Step-through pattern;Decreased stride length     General Gait Details: Assist to stabilize intermittently. Cues for safety. Slow gait speed.    Stairs            Wheelchair Mobility    Modified Rankin (Stroke Patients Only)       Balance Overall balance assessment: Needs assistance          Standing balance support: Bilateral upper extremity supported Standing balance-Leahy Scale: Poor                              Cognition Arousal/Alertness: Awake/alert Behavior During Therapy: WFL for tasks assessed/performed Overall Cognitive Status: Within Functional Limits for tasks assessed                                        Exercises      General Comments        Pertinent Vitals/Pain Pain Assessment: 0-10 Pain Score: 5  Pain Location: back Pain Descriptors / Indicators: Grimacing;Discomfort;Sore Pain Intervention(s): Limited activity within patient's tolerance;Repositioned    Home Living                      Prior Function            PT Goals (current goals can now be found in the care plan section) Progress towards PT goals: Progressing toward goals    Frequency    Min 3X/week      PT Plan Current plan remains appropriate    Co-evaluation              AM-PAC PT "6 Clicks" Daily Activity  Outcome Measure  Difficulty turning over in bed (including adjusting bedclothes, sheets and blankets)?: A Little Difficulty moving from lying on back to  sitting on the side of the bed? : A Little Difficulty sitting down on and standing up from a chair with arms (e.g., wheelchair, bedside commode, etc,.)?: A Little Help needed moving to and from a bed to chair (including a wheelchair)?: A Little Help needed walking in hospital room?: A Little Help needed climbing 3-5 steps with a railing? : A Lot 6 Click Score: 17    End of Session Equipment Utilized During Treatment: Gait belt Activity Tolerance: Patient tolerated treatment well;No increased pain Patient left: in chair;with call bell/phone within reach;with nursing/sitter in room (NT finishing bath. Chair alarm pad in place. )   PT Visit Diagnosis: Muscle weakness (generalized) (M62.81);Difficulty in walking, not elsewhere classified (R26.2)     Time:  4580-9983 PT Time Calculation (min) (ACUTE ONLY): 15 min  Charges:  $Gait Training: 8-22 mins                    G Codes:          Weston Anna, MPT Pager: [phone]

## 2016-07-26 NOTE — Progress Notes (Signed)
Patient ID: Eric Rivers, male   DOB: 1930/11/28, 83 y.o.   MRN: [id number]    Referring Physician(s): Dr. Florencia Reasons  Supervising Physician: Sandi Mariscal  Patient Status: [facility] - In-pt  Chief Complaint: L2 compression fracture  Subjective: Patient is feeling slightly better today regarding his pain, but is more achy.  He has eaten.  He has not been up to mobilize yet.  Allergies: Metoprolol  Medications: Prior to Admission medications   Medication Sig Start Date End Date Taking? Authorizing Provider  acetaminophen (TYLENOL) 500 MG tablet Take 1,000 mg by mouth every 8 (eight) hours as needed for mild pain, moderate pain, fever or headache.    Yes [provider]  allopurinol (ZYLOPRIM) 300 MG tablet take 1 tablet by mouth once daily 02/12/16  Yes Biagio Borg, MD  amLODipine (NORVASC) 2.5 MG tablet take 1 tablet by mouth once daily 07/16/16  Yes Dorothy Spark, MD  aspirin EC 325 MG tablet Take 325 mg by mouth every morning.    Yes [provider]  atorvastatin (LIPITOR) 20 MG tablet take 1 tablet by mouth once daily 02/12/16  Yes Biagio Borg, MD  Calcium Carbonate-Vitamin D (CALCIUM 600+D) 600-400 MG-UNIT tablet Take 1 tablet by mouth at bedtime.   Yes [provider]  desonide (DESOWEN) 0.05 % lotion Apply 1 application topically daily as needed (rosacea).    Yes [provider]  docusate sodium (COLACE) 100 MG capsule Take 1 capsule (100 mg total) by mouth every 12 (twelve) hours. 07/10/16  Yes Mesner, [name], MD  fentaNYL (DURAGESIC) 25 MCG/HR patch Place 1 patch (25 mcg total) onto the skin every 3 (three) days. 07/15/16  Yes Biagio Borg, MD  ferrous sulfate 325 (65 FE) MG tablet Take 650 mg by mouth daily with breakfast.   Yes [provider]  finasteride (PROSCAR) 5 MG tablet Take 1 tablet (5 mg total) by mouth every morning. 12/23/13  Yes Biagio Borg, MD  furosemide (LASIX) 40 MG tablet Take 1 tablet (40 mg total) by mouth  daily. 11/12/15  Yes Imogene Burn, PA-C  gabapentin (NEURONTIN) 100 MG capsule Take 1 capsule (100 mg total) by mouth 3 (three) times daily. 07/15/16  Yes Biagio Borg, MD  JANUVIA 100 MG tablet take 1 tablet by mouth once daily 06/23/16  Yes Biagio Borg, MD  ketoconazole (NIZORAL) 2 % shampoo Apply 1 application topically 4 (four) times a week.   Yes [provider]  mirabegron ER (MYRBETRIQ) 50 MG TB24 tablet Take 50 mg by mouth daily.   Yes [provider]  Multiple Vitamins-Minerals (ONCOVITE PO) Take 1 tablet by mouth daily.    Yes [provider]  oxyCODONE (ROXICODONE) 5 MG immediate release tablet Take 1 tablet (5 mg total) by mouth every 4 (four) hours as needed for severe pain. 07/15/16  Yes Biagio Borg, MD  pantoprazole (PROTONIX) 40 MG tablet take 1 tablet by mouth once daily 02/12/16  Yes Biagio Borg, MD  potassium chloride (K-DUR,KLOR-CON) 10 MEQ tablet take 1 tablet by mouth once daily 07/02/16  Yes Dorothy Spark, MD  senna (SENOKOT) 8.6 MG tablet Take 1 tablet by mouth at bedtime.    Yes [provider]  Tamsulosin HCl (FLOMAX) 0.4 MG CAPS Take 0.4 mg by mouth at bedtime.    Yes [provider]  tiZANidine (ZANAFLEX) 4 MG tablet Take 1 tablet (4 mg total) by mouth every 6 (six) hours as needed  for muscle spasms. 07/04/16  Yes Biagio Borg, MD  traMADol (ULTRAM) 50 MG tablet Take 1 tablet (50 mg total) by mouth every 6 (six) hours as needed. Patient taking differently: Take 50 mg by mouth every 6 (six) hours as needed for moderate pain.  07/04/16  Yes Biagio Borg, MD  traZODone (DESYREL) 50 MG tablet Take 1 tablet (50 mg total) by mouth See admin instructions. Patient taking differently: Take 50 mg by mouth at bedtime.  01/30/16  Yes Biagio Borg, MD  warfarin (COUMADIN) 4 MG tablet Take 4-6 mg by mouth daily. Pt takes one and one-half tablet (6mg ) on Thursday and one tablet (4mg ) all other days.   Yes [provider]     Vital Signs: BP 116/69 (BP Location: Left Arm)   Pulse 60   Temp 97.5 F (36.4 C) (Oral)   Resp 16   Ht 5\' 8"  (1.727 m)   Wt 196 lb 13.9 oz (89.3 kg)   SpO2 96%   BMI 29.93 kg/m   Physical Exam: Back: KP site is c/d/i with no drainage or erythema.  Imaging: Ir Kypho Lumbar Inc Fx Reduce Bone Bx Uni/bil Cannulation Inc/imaging  Result Date: 07/25/2016 CLINICAL DATA:  Symptomatic L2 compression fracture. EXAM: FLUOROSCOPIC GUIDED KYPHOPLASTY OF THE L2 VERTEBRAL BODY COMPARISON:  CT abdomen pelvis - 07/19/2016; 07/10/2016; lumbar spine MRI - 07/14/2016 MEDICATIONS: The patient is currently admitted to the hospital and receiving intravenous antibiotics. Antibiotics were administered with an appropriate time frame prior to this skin puncture. ANESTHESIA/SEDATION: Moderate (conscious) sedation was employed during this procedure. A total of Versed 6.5 mg and Fentanyl 150 mcg was administered intravenously. Moderate Sedation Time: 57 minutes. The patient's level of consciousness and vital signs were monitored continuously by radiology nursing throughout the procedure under my direct supervision. FLUOROSCOPY TIME:  14 min, 6 seconds (1610 mGy) COMPLICATIONS: None immediate. TECHNIQUE: The procedure, risks (including but not limited to bleeding, infection, organ damage), benefits, and alternatives were explained to the patient. Questions regarding the procedure were encouraged and answered. The patient understands and consents to the procedure. The patient was placed prone on the fluoroscopic table. The skin overlying the upper thoracic region was then prepped and draped in the usual sterile fashion. Maximal barrier sterile technique was utilized including caps, mask, sterile gowns, sterile gloves, sterile drape, hand hygiene and skin antiseptic. Intravenous Fentanyl and Versed were administered as conscious sedation during continuous cardiorespiratory monitoring by the radiology RN. The left pedicle  at L2 was then infiltrated with 1% lidocaine followed by the advancement of a Kyphon trocar needle through the left pedicle into the posterior one-third of the vertebral body. Subsequently, the osteo drill was advanced to the anterior third of the vertebral body. The osteo drill was retracted. Through the working cannula, a Kyphon inflatable bone tamp 15 x 3 was advanced and positioned with the distal marker approximately 5 mm from the anterior aspect of the cortex. Appropriate positioning was confirmed on the AP projection. At this time, the balloon was expanded using contrast via a Kyphon inflation syringe device via micro tubing. In similar fashion, the right L2 pedicle was infiltrated with 1% lidocaine followed by the advancement of a second Kyphon trocar needle through the right pedicle into the posterior third of the vertebral body. Subsequently, the osteo drill was coaxially advanced to the anterior right third. The osteo drill was exchanged for a Kyphon inflatable bone tamp 15 x 3, advanced to the 5 mm of the anterior aspect  of the cortex. The balloon was then expanded using contrast as above. Inflations were continued until there was near apposition with the superior end plate. At this time, methylmethacrylate mixture was reconstituted in the Kyphon bone mixing device system. This was then loaded into the delivery mechanism, attached to Kyphon bone fillers. The balloons were deflated and removed followed by the instillation of methylmethacrylate mixture with excellent filling in the AP and lateral projections. No extravasation was noted in the disk spaces or posteriorly into the spinal canal. No epidural venous contamination was seen. The working cannulae and the bone filler were then retrieved and removed. Hemostasis was achieved with manual compression. The patient tolerated the procedure well without immediate postprocedural complication. IMPRESSION: 1. Technically successful L2 vertebral body  augmentation using balloon kyphoplasty. 2. Per CMS PQRS reporting requirements (PQRS Measure 24): Given the patient's age of greater than 25 and the fracture site (hip, distal radius, or spine), the patient should be tested for osteoporosis using DXA, and the appropriate treatment considered based on the DXA results. Electronically Signed   By: Sandi Mariscal M.D.   On: 07/25/2016 16:54    Labs:  CBC:  Recent Labs  07/23/16 1938 07/24/16 0519 07/25/16 0527 07/26/16 0537  WBC 12.3* 12.4* 11.8* 9.9  HGB 12.5* 12.5* 12.7* 13.0  HCT 38.1* 38.1* 38.6* 39.6  PLT 219 183 216 207    COAGS:  Recent Labs  07/23/16 0019 07/24/16 0519 07/25/16 0527 07/26/16 0537  INR 1.29 1.21 1.22 1.21    BMP:  Recent Labs  07/23/16 0019 07/24/16 0519 07/25/16 0527 07/26/16 0537  NA 135 135 136 139  K 4.4 4.6 4.5 3.5  CL 102 103 98* 100*  CO2 26 26 29 28   GLUCOSE 171* 127* 167* 112*  BUN 19 23* 23* 22*  CALCIUM 9.0 9.5 9.6 9.7  CREATININE 0.99 0.90 0.93 0.77  GFRNONAA >60 >60 >60 >60  GFRAA >60 >60 >60 >60    LIVER FUNCTION TESTS:  Recent Labs  12/10/15 1505 04/07/16 1311 07/07/16 1725 07/10/16 1546  BILITOT 0.81 1.49* 0.9 1.8*  AST 20 21 25  38  ALT 13 13 16 27   ALKPHOS 97 96 80 81  PROT 7.3 8.0 7.2 7.9  ALBUMIN 3.8 4.2 4.4 4.4    Assessment and Plan: 1. L2 compression fracture, s/p KP -patient may start mobilizing today.  He likely will need a PT consult for mobilization as his son states he hasn't been out of bed this entire admission.   -he may wear his brace if he feels like it gives him some extra support and comfort, but if it does not, then he does not have to wear this. -he is instructed to not stoop, bend, lift over 5-10lbs. -I have sent a message to our office to arrange for follow up for this patient in 2-3 weeks in our office.  This was also discussed with the patient and his son in the room. -call with questions  Electronically Signed: Coral Soler  E 07/26/2016, 8:36 AM   I spent a total of 15 Minutes at the the patient's bedside AND on the patient's hospital floor or unit, greater than 50% of which was counseling/coordinating care for L2 compression fx

## 2016-07-26 NOTE — Progress Notes (Signed)
[place] for Heparin Indication: atrial fibrillation  Allergies  Allergen Reactions  . Metoprolol Other (See Comments)    Reaction:  Bradycardia with 25mg  BID dose    Patient Measurements: Height: 5\' 8"  (172.7 cm) Weight: 196 lb 13.9 oz (89.3 kg) IBW/kg (Calculated) : 68.4 Heparin Dosing Weight: 87 kg  Vital Signs: Temp: 97.5 F (36.4 C) (05/26 0509) Temp Source: Oral (05/26 0509) BP: 116/69 (05/26 0509) Pulse Rate: 60 (05/26 0509)  Labs:  Recent Labs  07/24/16 0519 07/25/16 0527 07/26/16 0537  HGB 12.5* 12.7* 13.0  HCT 38.1* 38.6* 39.6  PLT 183 216 207  LABPROT 15.4* 15.5* 15.3*  INR 1.21 1.22 1.21  HEPARINUNFRC 0.36 0.40 0.27*  CREATININE 0.90 0.93 0.77    Estimated Creatinine Clearance: 74.7 mL/min (by C-G formula based on SCr of 0.77 mg/dL).  Assessment: 83 yr male admitted on 5/20 with low back pain (xray shows L2 compression fracture).  PMH significant for AFib and patient on warfarin PTA and UTI (recently completed course of antibiotics).  Admitting INR = 5.31 with warfarin regimen of 4mg  daily except 6mg  on Thursdays (last dose taken 5/19)   Baseline INR (5/21) = 4.93;  Vitamin K 10mg  IV x 1 given 5/21  Significant events:  Started heparin 5/22  Heparin held 5/25 for kyphoplasty, resumed in PM - IR did not provide any instructions on resuming warfarin however.  Today, 07/26/2016  Heparin level now subtherapeutic on 850 units/hr - patient had previously been therapeutic on this rate for several days  Currently in AFib per RN  INR remains subtherapeutic off warfarin  No bleeding or line issues reported  Goal of Therapy:  Heparin level 0.3-0.7 units/ml Monitor platelets by anticoagulation protocol: Yes   Plan:   Increase heparin to 900 units/hr. Given that HL was previously therapeutic and stable on 850 units/hr, I suspect level would have risen to therapeutic range on its own. However, since patient  appears to be in AFib on telemetry monitoring, will increase rate slightly. Avoiding bolus d/t recent procedure.  Recheck heparin level in 8 hr  CBC daily  Monitor for signs of bleeding or thrombosis  F/u plans for resuming coumadin now that procedure completed  Reuel Boom, PharmD, BCPS Pager: [phone] 07/26/2016, 7:40 AM

## 2016-07-27 LAB — GLUCOSE, CAPILLARY
GLUCOSE-CAPILLARY: 112 mg/dL — AB (ref 65–99)
GLUCOSE-CAPILLARY: 137 mg/dL — AB (ref 65–99)
GLUCOSE-CAPILLARY: 133 mg/dL — AB (ref 65–99)

## 2016-07-27 LAB — BASIC METABOLIC PANEL
ANION GAP: 9 (ref 5–15)
BUN: 24 mg/dL — ABNORMAL HIGH (ref 6–20)
BUN: 24 mg/dL — AB (ref 4–21)
CO2: 30 mmol/L (ref 22–32)
Calcium: 9.5 mg/dL (ref 8.9–10.3)
Chloride: 99 mmol/L — ABNORMAL LOW (ref 101–111)
Creatinine, Ser: 1.01 mg/dL (ref 0.61–1.24)
Creatinine: 1 mg/dL (ref 0.6–1.3)
GLUCOSE: 114 mg/dL
GLUCOSE: 114 mg/dL — AB (ref 65–99)
POTASSIUM: 3.8 mmol/L (ref 3.5–5.1)
Potassium: 3.8 mmol/L (ref 3.4–5.3)
SODIUM: 138 mmol/L (ref 137–147)
Sodium: 138 mmol/L (ref 135–145)

## 2016-07-27 LAB — CULTURE, BLOOD (ROUTINE X 2)
CULTURE: NO GROWTH
Culture: NO GROWTH
SPECIAL REQUESTS: ADEQUATE

## 2016-07-27 LAB — CBC
HCT: 44.1 % (ref 39.0–52.0)
HEMOGLOBIN: 14 g/dL (ref 13.0–17.0)
MCH: 30.2 pg (ref 26.0–34.0)
MCHC: 31.7 g/dL (ref 30.0–36.0)
MCV: 95.2 fL (ref 78.0–100.0)
Platelets: 225 10*3/uL (ref 150–400)
RBC: 4.63 MIL/uL (ref 4.22–5.81)
RDW: 15.3 % (ref 11.5–15.5)
WBC: 10 10*3/uL (ref 4.0–10.5)

## 2016-07-27 LAB — CBC AND DIFFERENTIAL
HCT: 44 % (ref 41–53)
Hemoglobin: 14 g/dL (ref 13.5–17.5)
PLATELETS: 225 10*3/uL (ref 150–399)
WBC: 10 10^3/mL

## 2016-07-27 LAB — PROTIME-INR
INR: 1.23
PROTHROMBIN TIME: 15.5 s — AB (ref 11.4–15.2)
Protime: 15.5 seconds — AB (ref 10.0–13.8)

## 2016-07-27 LAB — POCT INR: INR: 1.2 — AB (ref 0.9–1.1)

## 2016-07-27 LAB — HEPARIN LEVEL (UNFRACTIONATED): HEPARIN UNFRACTIONATED: 0.38 [IU]/mL (ref 0.30–0.70)

## 2016-07-27 MED ORDER — AMLODIPINE BESYLATE 5 MG PO TABS
2.5000 mg | ORAL_TABLET | Freq: Every day | ORAL | Status: DC
  Administered 2016-07-27 – 2016-07-28 (×2): 2.5 mg via ORAL
  Filled 2016-07-27 (×2): qty 1

## 2016-07-27 MED ORDER — WARFARIN SODIUM 5 MG PO TABS
5.0000 mg | ORAL_TABLET | Freq: Once | ORAL | Status: AC
  Administered 2016-07-27: 5 mg via ORAL
  Filled 2016-07-27: qty 1

## 2016-07-27 NOTE — Progress Notes (Signed)
Patient ID: NHIA HEAPHY, male   DOB: December 28, 1930, 83 y.o.   MRN: [id number]  PROGRESS NOTE    Eric Rivers  NFA:[id number] DOB: [birth year] DOA: 07/19/2016 PCP: Biagio Borg, MD   Brief Narrative:  83 y.o.malewith medical history significant of DM-2, HTN, Afib on coumadin, recently diagnosed with L2 compression fracture-presented to the ED for evaluation of intractable back pain.He recently was seen in the ED on and 5/10 and 5/14 for similar problems. He had a mechanical fall earlier this month, with gradual worsening of his low back pain. A MRI LS Spine on 5/14 confirmed a L2 compression fracture, he was started on oral narcotics and other supportive measures, but continued to have pain. He presented back to the ED on 5/19-continued to have pain inspite of narcotics, benzo's, and was also found to have a UTI and coagulopathy. Patient was started on for spectrum antibiotics. Urine culture grew Citrobacter resistant to Rocephin; antibiotic was switched to cefepime on 07/22/2016. Patient is currently on heparin drip. INR is improved. The patient had kyphoplasty done by interventional radiology on 07/25/2016. His pain is improving.  Assessment & Plan:   Principal Problem:   UTI (urinary tract infection) Active Problems:   Diabetes mellitus with neuropathy (HCC)   Chronic atrial fibrillation (HCC)   Hypertension   Hyperlipidemia   Chronic diastolic CHF (congestive heart failure) (HCC)   Warfarin-induced coagulopathy (HCC)   Closed compression fracture of L2 lumbar vertebra (HCC)   Intractable back pain   Intractable low back pain:likelydue to L2 compression fracture. --Lumbar x ray on presentation: Moderate L2 compression fracture age indeterminate, unchanged from the recent CT and new since 06/11/2015. Mild spondylosis of the lumbar spine with disc disease at the L4-5 level. Grade 1 anterolisthesis of L4 on L5 due to the moderate facet arthropathy. -- Dr Sloan Leiter discussed the  case with Dr. Rosalee Kaufman on-call-who suggested that we continue with supportive measures, and if he continues to have pain consult interventional radiology for possible kyphoplasty.  -Status post kyphoplasty done by IR on 07/25/2016. Follow up with IR as an outpatient - continue brace if needed -Pain management,stool regimen - Status post trial of steroid taper - Continue oral pain medications -PT/OT eval  Coagulopathy:  --INR 5.3 on admission - Resolved. INR is 1.23today -Coumadin was restarted yesterday. Coumadin is dosed as per Pharmacy.  Recurrent UTI:  -CT renal stone:no hydro, no renal stone, Bladder is underdistended. Stable high density material/calcification along the posterior aspect of the bladder (series 2/image 66) extending into a prostate TURP defect (series 2/image 71). -urine culture Citrobacter that is resistant to rocephin, changed abx to cipro on 5/22 which was again held because of bradycardia and switched to cefepime. Cefepime was changed to Cipro on 07/26/2016. Discontinue ciprofloxacin after 07/27/2016 - CT scan finding was discussed with urology on call Dr Karsten Ro who recommended finish abx treatment and outpatient follow with Dr Tresa Moore within one week of discharge to consider cystoscopyand stone removal.  Chronic Atrial fibrillation: --currently rate controlled without the use of any rate control agent ( h/o bradycardia on betablocker) - Continue Coumadin and heparin drip for now.  H/o diastolic chf: euvolemic , close monitor volume status. Continue lasix  noninsulin dependentType 2 diabetes: Hold oral agents januvia-place on SSI and follow CBGs.  Hypertension: follow BP trend. Continuelasix. Restart Lasix  BPH:s/p TURP in 2014, Continue Flomax/proscar. Outpatient urology follow-up  Dyslipidemia: Continue statin  Gout:Continue allopurinol-no evidence of flare at this time.  Leukocytosis:probably reactive;  resolved CONSULTS:  Neurosurgery Dr Ronnald Ramp by phone on admission IR Urology Dr Karsten Ro over the phone on 5/22  DVT Prophylaxis: Heparin drip; Coumadin  Code Status: DNR  Family Communication:None present at bedside  Disposition Plan: Probable Discharge to SNF in 1-2dayswhen bed is available Procedures: Kyphoplasty on 07/05/2016  Antibiotics:  Rocephin from admission to 5/22  cipro from 5/22- 5/22; restarted on 07/26/2016>>  Cefepime from 5/22- 07/26/2016 Subjective: Patient seen and examined at bedside. He denies any overnight fever, nausea, vomiting. His back pain is better controlled  Objective: Vitals:   07/26/16 0509 07/26/16 1300 07/26/16 2119 07/27/16 0447  BP: 116/69 123/69 (!) 153/86 (!) 141/80  Pulse: 60 60 67 62  Resp: 16 16 18 16   Temp: 97.5 F (36.4 C) 98 F (36.7 C) 98.1 F (36.7 C) 97.5 F (36.4 C)  TempSrc: Oral Oral Oral Axillary  SpO2: 96% 93% 98% 96%  Weight:      Height:        Intake/Output Summary (Last 24 hours) at 07/27/16 1333 Last data filed at 07/27/16 1200  Gross per 24 hour  Intake             1278 ml  Output             1350 ml  Net              -72 ml   Filed Weights   07/19/16 2009 07/20/16 0826  Weight: 95.3 kg (210 lb) 89.3 kg (196 lb 13.9 oz)    Examination:  General exam: Appears calm and comfortable  Respiratory system: Bilateral decreased breath sound at bases With some scattered crackles Cardiovascular system: S1 & S2 heard, rate controlled  Gastrointestinal system: Abdomen is nondistended, soft and nontender. Normal bowel sounds heard. Extremities: No cyanosis, clubbing; trace pedal edema      Data Reviewed: I have personally reviewed following labs and imaging studies  CBC:  Recent Labs Lab 07/23/16 1938 07/24/16 0519 07/25/16 0527 07/26/16 0537 07/27/16 0511  WBC 12.3* 12.4* 11.8* 9.9 10.0  NEUTROABS  --   --  10.4*  --   --   HGB 12.5* 12.5* 12.7* 13.0 14.0  HCT 38.1* 38.1* 38.6* 39.6  44.1  MCV 94.8 95.3 94.8 94.3 95.2  PLT 219 183 216 207 427   Basic Metabolic Panel:  Recent Labs Lab 07/22/16 0531 07/23/16 0019 07/24/16 0519 07/25/16 0527 07/26/16 0537 07/27/16 0511  NA 137 135 135 136 139 138  K 3.6 4.4 4.6 4.5 3.5 3.8  CL 99* 102 103 98* 100* 99*  CO2 30 26 26 29 28 30   GLUCOSE 126* 171* 127* 167* 112* 114*  BUN 15 19 23* 23* 22* 24*  CREATININE 0.93 0.99 0.90 0.93 0.77 1.01  CALCIUM 9.2 9.0 9.5 9.6 9.7 9.5  MG 1.7  --   --   --  1.7  --    GFR: Estimated Creatinine Clearance: 59.1 mL/min (by C-G formula based on SCr of 1.01 mg/dL). Liver Function Tests: No results for input(s): AST, ALT, ALKPHOS, BILITOT, PROT, ALBUMIN in the last 168 hours. No results for input(s): LIPASE, AMYLASE in the last 168 hours. No results for input(s): AMMONIA in the last 168 hours. Coagulation Profile:  Recent Labs Lab 07/23/16 0019 07/24/16 0519 07/25/16 0527 07/26/16 0537 07/27/16 0511  INR 1.29 1.21 1.22 1.21 1.23   Cardiac Enzymes: No results for input(s): CKTOTAL, CKMB, CKMBINDEX, TROPONINI in the last 168 hours. BNP (last 3 results)  Recent Labs  09/07/15 1107  PROBNP 566.0*   HbA1C: No results for input(s): HGBA1C in the last 72 hours. CBG:  Recent Labs Lab 07/26/16 1128 07/26/16 1726 07/26/16 2119 07/27/16 0826 07/27/16 1115  GLUCAP 122* 192* 155* 112* 137*   Lipid Profile: No results for input(s): CHOL, HDL, LDLCALC, TRIG, CHOLHDL, LDLDIRECT in the last 72 hours. Thyroid Function Tests: No results for input(s): TSH, T4TOTAL, FREET4, T3FREE, THYROIDAB in the last 72 hours. Anemia Panel: No results for input(s): VITAMINB12, FOLATE, FERRITIN, TIBC, IRON, RETICCTPCT in the last 72 hours. Sepsis Labs: No results for input(s): PROCALCITON, LATICACIDVEN in the last 168 hours.  Recent Results (from the past 240 hour(s))  Urine culture     Status: Abnormal   Collection Time: 07/19/16  8:24 PM  Result Value Ref Range Status   Specimen  Description URINE, CLEAN CATCH  Final   Special Requests NONE  Final   Culture 20,000 COLONIES/mL CITROBACTER FREUNDII (A)  Final   Report Status 07/22/2016 FINAL  Final   Organism ID, Bacteria CITROBACTER FREUNDII (A)  Final      Susceptibility   Citrobacter freundii - MIC*    CEFAZOLIN >=64 RESISTANT Resistant     CEFEPIME <=1 SENSITIVE Sensitive     CEFTRIAXONE >=64 RESISTANT Resistant     CIPROFLOXACIN <=0.25 SENSITIVE Sensitive     GENTAMICIN <=1 SENSITIVE Sensitive     IMIPENEM <=0.25 SENSITIVE Sensitive     NITROFURANTOIN <=16 SENSITIVE Sensitive     TRIMETH/SULFA <=20 SENSITIVE Sensitive     PIP/TAZO 64 INTERMEDIATE Intermediate     * 20,000 COLONIES/mL CITROBACTER FREUNDII  Culture, blood (routine x 2)     Status: None   Collection Time: 07/22/16  4:43 PM  Result Value Ref Range Status   Specimen Description BLOOD LEFT ANTECUBITAL  Final   Special Requests   Final    BOTTLES DRAWN AEROBIC AND ANAEROBIC Blood Culture results may not be optimal due to an excessive volume of blood received in culture bottles   Culture   Final    NO GROWTH 5 DAYS Performed at [facility], [place] [address] [place], [place] [address]    Report Status 07/27/2016 FINAL  Final  Culture, blood (routine x 2)     Status: None   Collection Time: 07/22/16  4:43 PM  Result Value Ref Range Status   Specimen Description BLOOD LEFT HAND  Final   Special Requests IN PEDIATRIC BOTTLE Blood Culture adequate volume  Final   Culture   Final    NO GROWTH 5 DAYS Performed at [facility], [place] [address], [place], [place] [address]    Report Status 07/27/2016 FINAL  Final         Radiology Studies: Ir Kypho Lumbar Inc Fx Reduce Bone Bx Uni/bil Cannulation Inc/imaging  Result Date: 07/25/2016 CLINICAL DATA:  Symptomatic L2 compression fracture. EXAM: FLUOROSCOPIC GUIDED KYPHOPLASTY OF THE L2 VERTEBRAL BODY COMPARISON:  CT abdomen pelvis - 07/19/2016; 07/10/2016; lumbar spine MRI - 07/14/2016  MEDICATIONS: The patient is currently admitted to the hospital and receiving intravenous antibiotics. Antibiotics were administered with an appropriate time frame prior to this skin puncture. ANESTHESIA/SEDATION: Moderate (conscious) sedation was employed during this procedure. A total of Versed 6.5 mg and Fentanyl 150 mcg was administered intravenously. Moderate Sedation Time: 57 minutes. The patient's level of consciousness and vital signs were monitored continuously by radiology nursing throughout the procedure under my direct supervision. FLUOROSCOPY TIME:  14 min, 6 seconds (2585 mGy) COMPLICATIONS: None immediate. TECHNIQUE: The  procedure, risks (including but not limited to bleeding, infection, organ damage), benefits, and alternatives were explained to the patient. Questions regarding the procedure were encouraged and answered. The patient understands and consents to the procedure. The patient was placed prone on the fluoroscopic table. The skin overlying the upper thoracic region was then prepped and draped in the usual sterile fashion. Maximal barrier sterile technique was utilized including caps, mask, sterile gowns, sterile gloves, sterile drape, hand hygiene and skin antiseptic. Intravenous Fentanyl and Versed were administered as conscious sedation during continuous cardiorespiratory monitoring by the radiology RN. The left pedicle at L2 was then infiltrated with 1% lidocaine followed by the advancement of a Kyphon trocar needle through the left pedicle into the posterior one-third of the vertebral body. Subsequently, the osteo drill was advanced to the anterior third of the vertebral body. The osteo drill was retracted. Through the working cannula, a Kyphon inflatable bone tamp 15 x 3 was advanced and positioned with the distal marker approximately 5 mm from the anterior aspect of the cortex. Appropriate positioning was confirmed on the AP projection. At this time, the balloon was expanded using  contrast via a Kyphon inflation syringe device via micro tubing. In similar fashion, the right L2 pedicle was infiltrated with 1% lidocaine followed by the advancement of a second Kyphon trocar needle through the right pedicle into the posterior third of the vertebral body. Subsequently, the osteo drill was coaxially advanced to the anterior right third. The osteo drill was exchanged for a Kyphon inflatable bone tamp 15 x 3, advanced to the 5 mm of the anterior aspect of the cortex. The balloon was then expanded using contrast as above. Inflations were continued until there was near apposition with the superior end plate. At this time, methylmethacrylate mixture was reconstituted in the Kyphon bone mixing device system. This was then loaded into the delivery mechanism, attached to Kyphon bone fillers. The balloons were deflated and removed followed by the instillation of methylmethacrylate mixture with excellent filling in the AP and lateral projections. No extravasation was noted in the disk spaces or posteriorly into the spinal canal. No epidural venous contamination was seen. The working cannulae and the bone filler were then retrieved and removed. Hemostasis was achieved with manual compression. The patient tolerated the procedure well without immediate postprocedural complication. IMPRESSION: 1. Technically successful L2 vertebral body augmentation using balloon kyphoplasty. 2. Per CMS PQRS reporting requirements (PQRS Measure 24): Given the patient's age of greater than 34 and the fracture site (hip, distal radius, or spine), the patient should be tested for osteoporosis using DXA, and the appropriate treatment considered based on the DXA results. Electronically Signed   By: Sandi Mariscal M.D.   On: 07/25/2016 16:54        Scheduled Meds: . acetaminophen  1,000 mg Oral Q8H  . allopurinol  300 mg Oral Daily  . atorvastatin  20 mg Oral q1800  . calcium-vitamin D  1 tablet Oral QHS  . ciprofloxacin  500  mg Oral BID  . ferrous sulfate  325 mg Oral Q breakfast  . finasteride  5 mg Oral q morning - 10a  . furosemide  40 mg Oral Daily  . gabapentin  100 mg Oral TID  . insulin aspart  0-9 Units Subcutaneous TID WC  . multivitamin with minerals  1 tablet Oral Daily  . pantoprazole  40 mg Oral Daily  . polyethylene glycol  17 g Oral Daily  . potassium chloride  10 mEq Oral Daily  .  senna-docusate  1 tablet Oral BID  . sodium chloride flush  3 mL Intravenous Q12H  . tamsulosin  0.4 mg Oral QHS  . traZODone  50 mg Oral QHS  . warfarin  5 mg Oral ONCE-1800  . Warfarin - Pharmacist Dosing Inpatient   Does not apply q1800   Continuous Infusions: . sodium chloride    . heparin 900 Units/hr (07/27/16 0458)     LOS: 7 days        Aline August, MD [facility] Pager [phone]  If 7PM-7AM, please contact night-coverage [email or web address] Password [email or web address] 07/27/2016, 1:33 PM

## 2016-07-27 NOTE — Progress Notes (Signed)
Eric Rivers for Heparin/Coumadin Indication: atrial fibrillation  Allergies  Allergen Reactions  . Metoprolol Other (See Comments)    Reaction:  Bradycardia with 25mg  BID dose    Patient Measurements: Height: 5\' 8"  (172.7 cm) Weight: 196 lb 13.9 oz (89.3 kg) IBW/kg (Calculated) : 68.4 Heparin Dosing Weight: 87 kg  Vital Signs: Temp: 97.5 F (36.4 C) (05/27 0447) Temp Source: Axillary (05/27 0447) BP: 141/80 (05/27 0447) Pulse Rate: 62 (05/27 0447)  Labs:  Recent Labs  07/25/16 0527 07/26/16 0537 07/26/16 1541 07/27/16 0511  HGB 12.7* 13.0  --  14.0  HCT 38.6* 39.6  --  44.1  PLT 216 207  --  225  LABPROT 15.5* 15.3*  --  15.5*  INR 1.22 1.21  --  1.23  HEPARINUNFRC 0.40 0.27* 0.37 0.38  CREATININE 0.93 0.77  --  1.01    Estimated Creatinine Clearance: 59.1 mL/min (by C-G formula based on SCr of 1.01 mg/dL).  Assessment: 83 yr male admitted on 5/20 with low back pain (xray shows L2 compression fracture).  PMH significant for AFib and patient on warfarin PTA and UTI (recently completed course of antibiotics).  Admitting INR = 5.31 with warfarin regimen of 4mg  daily except 6mg  on Thursdays (last dose taken 5/19)   Baseline INR (5/21) = 4.93;  Vitamin K 10mg  IV x 1 given 5/21  Significant events:  Started heparin 5/22  Heparin held 5/25 for kyphoplasty, resumed in PM  Ok to resume coumadin 526  Today, 07/27/2016  Heparin level 0.38, therapeutic 900 units/hr  CBC: Hgb stable at 14, Plts wnl  INR: 1.23, remains subtherapeutic as expected after one dose of coumadin 5mg  last PM. Effects of vitamin K on 5/21 should be diminished by now  No bleeding or line issues reported  Goal of Therapy:  Heparin level 0.3-0.7 units/ml Monitor platelets by anticoagulation protocol: Yes  INR 2-3   Plan:   Continue heparin at 900 units/hr (could transition to Lovenox 90mg  SQ q12h if no further invasive procedures planned)  Repepeat  coumadin 5mg  PO x 1 at 18:00 today - monitor closely for interaction with cipro (expect that last day of therapy will be 5/28 to complete 7 days total therapy for Citrobacter UTI since cefepime started 5/22)  Heparin level, CBC and INR daily  Monitor for signs of bleeding or thrombosis  Peggyann Juba, PharmD, BCPS Pager: [phone] 07/27/2016, 8:12 AM

## 2016-07-28 DIAGNOSIS — K219 Gastro-esophageal reflux disease without esophagitis: Secondary | ICD-10-CM | POA: Diagnosis not present

## 2016-07-28 DIAGNOSIS — A498 Other bacterial infections of unspecified site: Secondary | ICD-10-CM | POA: Diagnosis not present

## 2016-07-28 DIAGNOSIS — R41841 Cognitive communication deficit: Secondary | ICD-10-CM | POA: Diagnosis not present

## 2016-07-28 DIAGNOSIS — R4182 Altered mental status, unspecified: Secondary | ICD-10-CM | POA: Diagnosis not present

## 2016-07-28 DIAGNOSIS — I482 Chronic atrial fibrillation: Secondary | ICD-10-CM | POA: Diagnosis not present

## 2016-07-28 DIAGNOSIS — I10 Essential (primary) hypertension: Secondary | ICD-10-CM

## 2016-07-28 DIAGNOSIS — M109 Gout, unspecified: Secondary | ICD-10-CM | POA: Diagnosis not present

## 2016-07-28 DIAGNOSIS — S32020D Wedge compression fracture of second lumbar vertebra, subsequent encounter for fracture with routine healing: Secondary | ICD-10-CM | POA: Diagnosis not present

## 2016-07-28 DIAGNOSIS — Z8551 Personal history of malignant neoplasm of bladder: Secondary | ICD-10-CM | POA: Diagnosis not present

## 2016-07-28 DIAGNOSIS — I5032 Chronic diastolic (congestive) heart failure: Secondary | ICD-10-CM | POA: Diagnosis not present

## 2016-07-28 DIAGNOSIS — Z5181 Encounter for therapeutic drug level monitoring: Secondary | ICD-10-CM | POA: Diagnosis not present

## 2016-07-28 DIAGNOSIS — R2681 Unsteadiness on feet: Secondary | ICD-10-CM | POA: Diagnosis not present

## 2016-07-28 DIAGNOSIS — N3000 Acute cystitis without hematuria: Secondary | ICD-10-CM | POA: Diagnosis not present

## 2016-07-28 DIAGNOSIS — S32028S Other fracture of second lumbar vertebra, sequela: Secondary | ICD-10-CM | POA: Diagnosis not present

## 2016-07-28 DIAGNOSIS — E1140 Type 2 diabetes mellitus with diabetic neuropathy, unspecified: Secondary | ICD-10-CM | POA: Diagnosis not present

## 2016-07-28 DIAGNOSIS — A419 Sepsis, unspecified organism: Secondary | ICD-10-CM | POA: Diagnosis not present

## 2016-07-28 DIAGNOSIS — M6281 Muscle weakness (generalized): Secondary | ICD-10-CM | POA: Diagnosis not present

## 2016-07-28 DIAGNOSIS — M5440 Lumbago with sciatica, unspecified side: Secondary | ICD-10-CM | POA: Diagnosis not present

## 2016-07-28 DIAGNOSIS — M5410 Radiculopathy, site unspecified: Secondary | ICD-10-CM | POA: Diagnosis not present

## 2016-07-28 DIAGNOSIS — D6832 Hemorrhagic disorder due to extrinsic circulating anticoagulants: Secondary | ICD-10-CM | POA: Diagnosis not present

## 2016-07-28 DIAGNOSIS — S32020S Wedge compression fracture of second lumbar vertebra, sequela: Secondary | ICD-10-CM | POA: Diagnosis not present

## 2016-07-28 DIAGNOSIS — R29898 Other symptoms and signs involving the musculoskeletal system: Secondary | ICD-10-CM | POA: Diagnosis not present

## 2016-07-28 DIAGNOSIS — N400 Enlarged prostate without lower urinary tract symptoms: Secondary | ICD-10-CM | POA: Diagnosis not present

## 2016-07-28 DIAGNOSIS — C61 Malignant neoplasm of prostate: Secondary | ICD-10-CM | POA: Diagnosis not present

## 2016-07-28 DIAGNOSIS — Z7901 Long term (current) use of anticoagulants: Secondary | ICD-10-CM | POA: Diagnosis not present

## 2016-07-28 DIAGNOSIS — M549 Dorsalgia, unspecified: Secondary | ICD-10-CM | POA: Diagnosis not present

## 2016-07-28 DIAGNOSIS — D689 Coagulation defect, unspecified: Secondary | ICD-10-CM | POA: Diagnosis not present

## 2016-07-28 DIAGNOSIS — S32020A Wedge compression fracture of second lumbar vertebra, initial encounter for closed fracture: Secondary | ICD-10-CM | POA: Diagnosis not present

## 2016-07-28 DIAGNOSIS — N179 Acute kidney failure, unspecified: Secondary | ICD-10-CM | POA: Diagnosis not present

## 2016-07-28 DIAGNOSIS — M545 Low back pain: Secondary | ICD-10-CM | POA: Diagnosis not present

## 2016-07-28 DIAGNOSIS — E162 Hypoglycemia, unspecified: Secondary | ICD-10-CM | POA: Diagnosis not present

## 2016-07-28 DIAGNOSIS — N3001 Acute cystitis with hematuria: Secondary | ICD-10-CM | POA: Diagnosis not present

## 2016-07-28 DIAGNOSIS — N390 Urinary tract infection, site not specified: Secondary | ICD-10-CM | POA: Diagnosis not present

## 2016-07-28 DIAGNOSIS — Z9889 Other specified postprocedural states: Secondary | ICD-10-CM | POA: Diagnosis not present

## 2016-07-28 DIAGNOSIS — K922 Gastrointestinal hemorrhage, unspecified: Secondary | ICD-10-CM | POA: Diagnosis not present

## 2016-07-28 LAB — CBC
HCT: 42.5 % (ref 39.0–52.0)
HEMOGLOBIN: 14 g/dL (ref 13.0–17.0)
MCH: 31.4 pg (ref 26.0–34.0)
MCHC: 32.9 g/dL (ref 30.0–36.0)
MCV: 95.3 fL (ref 78.0–100.0)
Platelets: 227 10*3/uL (ref 150–400)
RBC: 4.46 MIL/uL (ref 4.22–5.81)
RDW: 15.5 % (ref 11.5–15.5)
WBC: 9.1 10*3/uL (ref 4.0–10.5)

## 2016-07-28 LAB — BASIC METABOLIC PANEL
ANION GAP: 11 (ref 5–15)
BUN: 24 mg/dL — ABNORMAL HIGH (ref 6–20)
BUN: 24 mg/dL — AB (ref 4–21)
CHLORIDE: 99 mmol/L — AB (ref 101–111)
CO2: 30 mmol/L (ref 22–32)
CREATININE: 1 mg/dL (ref 0.6–1.3)
Calcium: 9.7 mg/dL (ref 8.9–10.3)
Creatinine, Ser: 0.97 mg/dL (ref 0.61–1.24)
GFR calc non Af Amer: >60 mL/min (ref >60)
GLUCOSE: 117 mg/dL
Glucose, Bld: 117 mg/dL — ABNORMAL HIGH (ref 65–99)
Potassium: 3.7 mmol/L (ref 3.5–5.1)
SODIUM: 140 mmol/L (ref 137–147)
SODIUM: 140 mmol/L (ref 135–145)

## 2016-07-28 LAB — PROTIME-INR
INR: 1.33
PROTIME: 16.6 s — AB (ref 10.0–13.8)
Prothrombin Time: 16.6 seconds — ABNORMAL HIGH (ref 11.4–15.2)

## 2016-07-28 LAB — GLUCOSE, CAPILLARY
GLUCOSE-CAPILLARY: 104 mg/dL — AB (ref 65–99)
GLUCOSE-CAPILLARY: 137 mg/dL — AB (ref 65–99)
GLUCOSE-CAPILLARY: 123 mg/dL — AB (ref 65–99)

## 2016-07-28 LAB — CBC AND DIFFERENTIAL: WBC: 9.1 10^3/mL

## 2016-07-28 LAB — HEPARIN LEVEL (UNFRACTIONATED): Heparin Unfractionated: 0.59 IU/mL (ref 0.30–0.70)

## 2016-07-28 LAB — POCT INR: INR: 1.3 — AB (ref 0.9–1.1)

## 2016-07-28 LAB — MAGNESIUM: MAGNESIUM: 1.7 mg/dL (ref 1.7–2.4)

## 2016-07-28 MED ORDER — POLYETHYLENE GLYCOL 3350 17 G PO PACK: 17.0000 g | PACK | Freq: Every day | ORAL | 0 refills | Status: AC | PRN

## 2016-07-28 MED ORDER — WARFARIN SODIUM 5 MG PO TABS
5.0000 mg | ORAL_TABLET | Freq: Once | ORAL | Status: AC
  Administered 2016-07-28: 5 mg via ORAL
  Filled 2016-07-28: qty 1

## 2016-07-28 MED ORDER — TRAMADOL HCL 50 MG PO TABS: 50.0000 mg | ORAL_TABLET | Freq: Four times a day (QID) | ORAL | 0 refills | Status: AC | PRN

## 2016-07-28 MED ORDER — OXYCODONE HCL 5 MG PO TABS: 5.0000 mg | ORAL_TABLET | Freq: Four times a day (QID) | ORAL | 0 refills | Status: DC | PRN

## 2016-07-28 MED ORDER — WARFARIN SODIUM 5 MG PO TABS: 5.0000 mg | ORAL_TABLET | Freq: Every day | ORAL | 0 refills | Status: DC

## 2016-07-28 NOTE — Discharge Summary (Signed)
Physician Discharge Summary  ISSAAC Rivers WGN:[id number] DOB: [birth year] DOA: 07/19/2016  PCP: Biagio Borg, MD  Admit date: 07/19/2016 Discharge date: 07/28/2016  Admitted From: Home  Disposition:  Nursing home  Recommendations for Outpatient Follow-up:  1. Follow up nursing home provider at earliest convenience 2. Please obtain BMP/CBC in one week. Follow-up INR at earliest convenience. Coumadin to be dosed as per INR 3. Follow-up with Dr. Manny/urologist in a week's time 4. Follow-up with interventional radiology in 2 weeks' time  Home Health: No Equipment/Devices: None  Discharge Condition: Stable CODE STATUS: DO NOT RESUSCITATE Diet recommendation: Heart Healthy  Brief/Interim Summary: 83 y.o.malewith medical history significant of DM-2, HTN, Afib on coumadin, recently diagnosed with L2 compression fracture-presented to the ED for evaluation of intractable back pain.He recently was seen in the ED on and 5/10 and 5/14 for similar problems. He had a mechanical fall earlier this month, with gradual worsening of his low back pain. A MRI LS Spine on 5/14 confirmed a L2 compression fracture, he was started on oral narcotics and other supportive measures, but continued to have pain. He presented back to the ED on 5/19-continued to have pain inspite of narcotics, benzo's, and was also found to have a UTI and coagulopathy. Patient was started on for spectrum antibiotics. Urine culture grew Citrobacter resistant to Rocephin; antibiotic was switched to cefepime on 07/22/2016. Patient is currently on heparin drip. INR is improved. The patient had kyphoplasty done by interventional radiology on 07/25/2016. His pain is improving. Antibiotics were switched back to ciprofloxacin, today would be his last dose. Coumadin has already been started. Heparin drip can be discontinued upon discharge.  Discharge Diagnoses:  Principal Problem:   UTI (urinary tract infection) Active Problems:   Diabetes  mellitus with neuropathy (HCC)   Chronic atrial fibrillation (HCC)   Hypertension   Hyperlipidemia   Chronic diastolic CHF (congestive heart failure) (HCC)   Warfarin-induced coagulopathy (HCC)   Closed compression fracture of L2 lumbar vertebra (HCC)   Intractable back pain  Intractable low back pain:likelydue to L2 compression fracture. --Lumbar x ray on presentation: Moderate L2 compression fracture age indeterminate, unchanged from the recent CT and new since 06/11/2015. Mild spondylosis of the lumbar spine with disc disease at the L4-5 level. Grade 1 anterolisthesis of L4 on L5 due to the moderate facet arthropathy. -- Dr Sloan Leiter discussed the case with Dr. Rosalee Kaufman on-call-who suggested that we continue with supportive measures, and if he continues to have pain consult interventional radiology for possible kyphoplasty.  -Status post kyphoplasty done by IR on 07/25/2016. Follow up with IR as an outpatient - continue brace if needed -Pain management,stool regimen - Status post trial of steroid taper - Continue oral pain medications -PT/OT eval in nursing home - Discharged to nursing home today  Coagulopathy:  --INR 5.3 on admission - Resolved. - Currently on heparin drip and Coumadin. Discontinue heparin drip. Discharge the patient on Coumadin. Check INR at earliest convenience. Coumadin to be dosed as per INR levels.  Recurrent UTI:  -CT renal stone:no hydro, no renal stone, Bladder is underdistended. Stable high density material/calcification along the posterior aspect of the bladder (series 2/image 66) extending into a prostate TURP defect (series 2/image 71). -urine culture Citrobacter that is resistant to rocephin, changedabx to cipro on 5/22 which was again held because of bradycardia and switched to cefepime. Cefepime was changed to Cipro on 07/26/2016. Patient has completed 7 days of antibiotic therapy after today. No need for further antibiotics. - CT  scan  finding was discussed with urology on call Dr Karsten Ro who recommended finish abx treatment and outpatient follow with Dr Tresa Moore within one week of discharge to consider cystoscopyand stone removal.  Chronic Atrial fibrillation: --currently rate controlled without the use of any rate control agent ( h/o bradycardia on betablocker) - Continue Coumadin   H/o diastolic chf: euvolemic , close monitor volume status. Continue lasix  noninsulin dependentType 2 diabetes: Resume Januvia on discharge  Hypertension: Continue Lasix and amlodipine  BPH:s/p TURP in 2014, Continue Flomax/proscar. Outpatient urology follow-up  Dyslipidemia: Continue statin  Gout:Continue allopurinol-no evidence of flare at this time  Discharge Instructions  Discharge Instructions    Call MD for:  difficulty breathing, headache or visual disturbances    Complete by:  As directed    Call MD for:  extreme fatigue    Complete by:  As directed    Call MD for:  persistant dizziness or light-headedness    Complete by:  As directed    Call MD for:  persistant nausea and vomiting    Complete by:  As directed    Call MD for:  severe uncontrolled pain    Complete by:  As directed    Call MD for:  temperature >100.4    Complete by:  As directed    Diet - low sodium heart healthy    Complete by:  As directed    Discharge instructions    Complete by:  As directed    Fall precautions   Increase activity slowly    Complete by:  As directed      Allergies as of 07/28/2016      Reactions   Metoprolol Other (See Comments)   Reaction:  Bradycardia with 25mg  BID dose      Medication List    STOP taking these medications   aspirin EC 325 MG tablet   fentaNYL 25 MCG/HR patch Commonly known as:  DURAGESIC     TAKE these medications   acetaminophen 500 MG tablet Commonly known as:  TYLENOL Take 1,000 mg by mouth every 8 (eight) hours as needed for mild pain, moderate pain, fever or headache.    allopurinol 300 MG tablet Commonly known as:  ZYLOPRIM take 1 tablet by mouth once daily   amLODipine 2.5 MG tablet Commonly known as:  NORVASC take 1 tablet by mouth once daily   atorvastatin 20 MG tablet Commonly known as:  LIPITOR take 1 tablet by mouth once daily   CALCIUM 600+D 600-400 MG-UNIT tablet Generic drug:  Calcium Carbonate-Vitamin D Take 1 tablet by mouth at bedtime.   desonide 0.05 % lotion Commonly known as:  DESOWEN Apply 1 application topically daily as needed (rosacea).   docusate sodium 100 MG capsule Commonly known as:  COLACE Take 1 capsule (100 mg total) by mouth every 12 (twelve) hours.   ferrous sulfate 325 (65 FE) MG tablet Take 650 mg by mouth daily with breakfast.   finasteride 5 MG tablet Commonly known as:  PROSCAR Take 1 tablet (5 mg total) by mouth every morning.   furosemide 40 MG tablet Commonly known as:  LASIX Take 1 tablet (40 mg total) by mouth daily.   gabapentin 100 MG capsule Commonly known as:  NEURONTIN Take 1 capsule (100 mg total) by mouth 3 (three) times daily.   JANUVIA 100 MG tablet Generic drug:  sitaGLIPtin take 1 tablet by mouth once daily   ketoconazole 2 % shampoo Commonly known as:  NIZORAL Apply 1 application  topically 4 (four) times a week.   MYRBETRIQ 50 MG Tb24 tablet Generic drug:  mirabegron ER Take 50 mg by mouth daily.   ONCOVITE PO Take 1 tablet by mouth daily.   oxyCODONE 5 MG immediate release tablet Commonly known as:  ROXICODONE Take 1 tablet (5 mg total) by mouth every 6 (six) hours as needed for severe pain. What changed:  when to take this   pantoprazole 40 MG tablet Commonly known as:  PROTONIX take 1 tablet by mouth once daily   polyethylene glycol packet Commonly known as:  MIRALAX / GLYCOLAX Take 17 g by mouth daily as needed.   potassium chloride 10 MEQ tablet Commonly known as:  K-DUR,KLOR-CON take 1 tablet by mouth once daily   senna 8.6 MG tablet Commonly known as:   SENOKOT Take 1 tablet by mouth at bedtime.   tamsulosin 0.4 MG Caps capsule Commonly known as:  FLOMAX Take 0.4 mg by mouth at bedtime.   tiZANidine 4 MG tablet Commonly known as:  ZANAFLEX Take 1 tablet (4 mg total) by mouth every 6 (six) hours as needed for muscle spasms.   traMADol 50 MG tablet Commonly known as:  ULTRAM Take 1 tablet (50 mg total) by mouth every 6 (six) hours as needed for moderate pain.   traZODone 50 MG tablet Commonly known as:  DESYREL Take 1 tablet (50 mg total) by mouth See admin instructions. What changed:  when to take this   warfarin 5 MG tablet Commonly known as:  COUMADIN Take 1 tablet (5 mg total) by mouth daily at 6 PM. What changed:  medication strength  how much to take  when to take this  additional instructions      Follow-up Information    Biagio Borg, MD. Schedule an appointment as soon as possible for a visit in 2 day(s).   Specialties:  Internal Medicine, Radiology Why:  As needed Contact information: [address] [address] [phone]        Alexis Frock, MD Follow up.   Specialty:  Urology Why:  For an appointment in 1 week when you get home. Contact information: [place] [address] [phone]        Sandi Mariscal, MD Follow up in 2 week(s).   Specialty:  Interventional Radiology Why:  our office will call you with appointment date and time Contact information: [address] [place] [address] [phone]          Allergies  Allergen Reactions  . Metoprolol Other (See Comments)    Reaction:  Bradycardia with 25mg  BID dose    Consultations: Neurosurgery Dr Ronnald Ramp by phone on admission IR Urology Dr Karsten Ro over the phone on 5/22   Procedures/Studies: Ct Abdomen Pelvis Wo Contrast  Result Date: 07/10/2016 CLINICAL DATA:  Low back and bilateral hip pain.  Recent fall. EXAM: CT ABDOMEN AND PELVIS WITHOUT CONTRAST TECHNIQUE: Multidetector  CT imaging of the abdomen and pelvis was performed following the standard protocol without IV contrast. COMPARISON:  06/11/2015 FINDINGS: Lower chest:  Subsegmental atelectasis in the lower lobes. Hepatobiliary: Nodular liver contour suggests cirrhosis. No focal abnormality in the liver on this study without intravenous contrast. Multiple calcified gallstones identified measuring around 5 mm each. No intrahepatic or extrahepatic biliary dilation. Pancreas: No focal mass lesion. No dilatation of the main duct. No intraparenchymal cyst. No peripancreatic edema. Spleen: No splenomegaly.  Tiny subcapsular cyst again noted. Adrenals/Urinary Tract: No  adrenal nodule or mass. 19 mm cystic lesion upper pole right kidney incompletely characterized on today's study but similar in appearance to 06/11/2015. Left kidney unremarkable. No evidence for hydroureter. Dystrophic calcification is identified in the posterior bladder wall extending to the central prostate gland, potentially related to prior TURBT. Stomach/Bowel: Stomach is nondistended. No gastric wall thickening. No evidence of outlet obstruction. Duodenum is normally positioned as is the ligament of Treitz. No small bowel wall thickening. No small bowel dilatation. The terminal ileum is normal. The appendix is normal. No gross colonic mass. No colonic wall thickening. No substantial diverticular change. Vascular/Lymphatic: Chronic thoracoabdominal aortic dissection again noted, incompletely visualized. There is no gastrohepatic or hepatoduodenal ligament lymphadenopathy. No intraperitoneal or retroperitoneal lymphadenopathy. No pelvic sidewall lymphadenopathy. Reproductive: Brachytherapy seeds noted prostate gland. Other: No intraperitoneal free fluid. Musculoskeletal: Small groin hernias contain only fat. Bone windows reveal no worrisome lytic or sclerotic osseous lesions. Compression deformity at the L2 level is age indeterminate on today's study but new since  06/11/2015. IMPRESSION: 1. No acute findings in the abdomen or pelvis. 2. Interval development of a large dystrophic calcification associated with the posterior bladder wall and central prostate, potentially related to the urethra. 3. Chronic thoracoabdominal aortic dissection. 4. Age-indeterminate L2 compression deformity is new since 06/11/2015. 5. Cholelithiasis. 6. Nodular liver contour suggests cirrhosis. Electronically Signed   By: Misty Stanley M.D.   On: 07/10/2016 16:39   Dg Lumbar Spine Complete  Result Date: 07/19/2016 CLINICAL DATA:  Severe low back pain chronically. EXAM: LUMBAR SPINE - COMPLETE 4+ VIEW COMPARISON:  CT 07/10/2016 and 06/11/2015 FINDINGS: Diffuse osteopenia. Mild spondylosis throughout the lumbar spine. Subtle grade 1 anterolisthesis of L4 on L5 likely due to the moderate facet arthropathy. Disc space narrowing at the L4-5 level. Moderate L2 compression fracture unchanged from recent CT and new compared to 06/11/2015. Calcified plaque over the abdominal aorta. IMPRESSION: Moderate L2 compression fracture age indeterminate, unchanged from the recent CT and new since 06/11/2015. Mild spondylosis of the lumbar spine with disc disease at the L4-5 level. Grade 1 anterolisthesis of L4 on L5 due to the moderate facet arthropathy. Electronically Signed   By: Marin Olp M.D.   On: 07/19/2016 21:04   Mr Thoracic Spine Wo Contrast  Result Date: 07/14/2016 CLINICAL DATA:  Progressive thoracic and lumbar pain. EXAM: MRI THORACIC AND LUMBAR SPINE WITHOUT CONTRAST TECHNIQUE: Multiplanar and multiecho pulse sequences of the thoracic and lumbar spine were obtained without intravenous contrast. COMPARISON:  CT scan of the abdomen and pelvis dated 07/10/2016 and CT scan of the chest dated 11/21/2015 FINDINGS: MRI THORACIC SPINE FINDINGS Alignment:  Physiologic. Vertebrae: No fracture, evidence of discitis, or bone lesion. Cord:  Normal signal and morphology. Paraspinal and other soft tissues:  Aneurysmal dilatation of the thoracic aorta including the ascending and descending portions with a chronic type B dissection of the descending thoracic aorta extending into the abdominal aorta. Disc levels: Diffuse desiccation of the discs. No significant disc bulging. No disc protrusions or neural impingement. No foraminal or spinal stenosis or significant facet joint disease. MRI LUMBAR SPINE FINDINGS Segmentation:  Standard. Alignment:  Physiologic. Vertebrae: Benign-appearing mild compression fracture of L2. Minimal protrusion of the posterior margin of L2 into the spinal canal without neural impingement. Conus medullaris: Extends to the L1-2 level and appears normal. Paraspinal and other soft tissues: Chronic dissection of the abdominal aorta extending into the left common iliac artery, appearing unchanged since the prior CT scan. Cholelithiasis. Disc levels: L1-2:  Tiny broad-based  disc bulge with no neural impingement. L2-3:  Tiny disc bulge into the left neural foramen. L3-4: Tiny broad-based disc bulge asymmetric to the left without neural impingement. L4-5: Disc space narrowing. Small broad-based disc osteophyte complex without neural impingement, extending into both neural foramina. This could affect the right L4 nerve. L5-S1: Tiny disc bulge asymmetric to the left with no neural impingement. Small Tarlov cyst at S2. IMPRESSION: MR THORACIC SPINE IMPRESSION No significant abnormalities of the thoracic spine. Chronic thoracolumbar aortic dissection and aneurysmal dilatation of the ascending thoracic aorta. MR LUMBAR SPINE IMPRESSION Benign appearing acute or subacute compression fracture of L2 without neural impingement. Degenerative disc disease primarily at L4-5 with moderate right foraminal stenosis which could affect the L4 nerve. Electronically Signed   By: Lorriane Shire M.D.   On: 07/14/2016 08:51   Mr Lumbar Spine Wo Contrast  Result Date: 07/14/2016 CLINICAL DATA:  Progressive thoracic and  lumbar pain. EXAM: MRI THORACIC AND LUMBAR SPINE WITHOUT CONTRAST TECHNIQUE: Multiplanar and multiecho pulse sequences of the thoracic and lumbar spine were obtained without intravenous contrast. COMPARISON:  CT scan of the abdomen and pelvis dated 07/10/2016 and CT scan of the chest dated 11/21/2015 FINDINGS: MRI THORACIC SPINE FINDINGS Alignment:  Physiologic. Vertebrae: No fracture, evidence of discitis, or bone lesion. Cord:  Normal signal and morphology. Paraspinal and other soft tissues: Aneurysmal dilatation of the thoracic aorta including the ascending and descending portions with a chronic type B dissection of the descending thoracic aorta extending into the abdominal aorta. Disc levels: Diffuse desiccation of the discs. No significant disc bulging. No disc protrusions or neural impingement. No foraminal or spinal stenosis or significant facet joint disease. MRI LUMBAR SPINE FINDINGS Segmentation:  Standard. Alignment:  Physiologic. Vertebrae: Benign-appearing mild compression fracture of L2. Minimal protrusion of the posterior margin of L2 into the spinal canal without neural impingement. Conus medullaris: Extends to the L1-2 level and appears normal. Paraspinal and other soft tissues: Chronic dissection of the abdominal aorta extending into the left common iliac artery, appearing unchanged since the prior CT scan. Cholelithiasis. Disc levels: L1-2:  Tiny broad-based disc bulge with no neural impingement. L2-3:  Tiny disc bulge into the left neural foramen. L3-4: Tiny broad-based disc bulge asymmetric to the left without neural impingement. L4-5: Disc space narrowing. Small broad-based disc osteophyte complex without neural impingement, extending into both neural foramina. This could affect the right L4 nerve. L5-S1: Tiny disc bulge asymmetric to the left with no neural impingement. Small Tarlov cyst at S2. IMPRESSION: MR THORACIC SPINE IMPRESSION No significant abnormalities of the thoracic spine. Chronic  thoracolumbar aortic dissection and aneurysmal dilatation of the ascending thoracic aorta. MR LUMBAR SPINE IMPRESSION Benign appearing acute or subacute compression fracture of L2 without neural impingement. Degenerative disc disease primarily at L4-5 with moderate right foraminal stenosis which could affect the L4 nerve. Electronically Signed   By: Lorriane Shire M.D.   On: 07/14/2016 08:51   Ir Kypho Lumbar Inc Fx Reduce Bone Bx Uni/bil Cannulation Inc/imaging  Result Date: 07/25/2016 CLINICAL DATA:  Symptomatic L2 compression fracture. EXAM: FLUOROSCOPIC GUIDED KYPHOPLASTY OF THE L2 VERTEBRAL BODY COMPARISON:  CT abdomen pelvis - 07/19/2016; 07/10/2016; lumbar spine MRI - 07/14/2016 MEDICATIONS: The patient is currently admitted to the hospital and receiving intravenous antibiotics. Antibiotics were administered with an appropriate time frame prior to this skin puncture. ANESTHESIA/SEDATION: Moderate (conscious) sedation was employed during this procedure. A total of Versed 6.5 mg and Fentanyl 150 mcg was administered intravenously. Moderate Sedation Time: 26  minutes. The patient's level of consciousness and vital signs were monitored continuously by radiology nursing throughout the procedure under my direct supervision. FLUOROSCOPY TIME:  14 min, 6 seconds (0626 mGy) COMPLICATIONS: None immediate. TECHNIQUE: The procedure, risks (including but not limited to bleeding, infection, organ damage), benefits, and alternatives were explained to the patient. Questions regarding the procedure were encouraged and answered. The patient understands and consents to the procedure. The patient was placed prone on the fluoroscopic table. The skin overlying the upper thoracic region was then prepped and draped in the usual sterile fashion. Maximal barrier sterile technique was utilized including caps, mask, sterile gowns, sterile gloves, sterile drape, hand hygiene and skin antiseptic. Intravenous Fentanyl and Versed were  administered as conscious sedation during continuous cardiorespiratory monitoring by the radiology RN. The left pedicle at L2 was then infiltrated with 1% lidocaine followed by the advancement of a Kyphon trocar needle through the left pedicle into the posterior one-third of the vertebral body. Subsequently, the osteo drill was advanced to the anterior third of the vertebral body. The osteo drill was retracted. Through the working cannula, a Kyphon inflatable bone tamp 15 x 3 was advanced and positioned with the distal marker approximately 5 mm from the anterior aspect of the cortex. Appropriate positioning was confirmed on the AP projection. At this time, the balloon was expanded using contrast via a Kyphon inflation syringe device via micro tubing. In similar fashion, the right L2 pedicle was infiltrated with 1% lidocaine followed by the advancement of a second Kyphon trocar needle through the right pedicle into the posterior third of the vertebral body. Subsequently, the osteo drill was coaxially advanced to the anterior right third. The osteo drill was exchanged for a Kyphon inflatable bone tamp 15 x 3, advanced to the 5 mm of the anterior aspect of the cortex. The balloon was then expanded using contrast as above. Inflations were continued until there was near apposition with the superior end plate. At this time, methylmethacrylate mixture was reconstituted in the Kyphon bone mixing device system. This was then loaded into the delivery mechanism, attached to Kyphon bone fillers. The balloons were deflated and removed followed by the instillation of methylmethacrylate mixture with excellent filling in the AP and lateral projections. No extravasation was noted in the disk spaces or posteriorly into the spinal canal. No epidural venous contamination was seen. The working cannulae and the bone filler were then retrieved and removed. Hemostasis was achieved with manual compression. The patient tolerated the  procedure well without immediate postprocedural complication. IMPRESSION: 1. Technically successful L2 vertebral body augmentation using balloon kyphoplasty. 2. Per CMS PQRS reporting requirements (PQRS Measure 24): Given the patient's age of greater than 65 and the fracture site (hip, distal radius, or spine), the patient should be tested for osteoporosis using DXA, and the appropriate treatment considered based on the DXA results. Electronically Signed   By: Sandi Mariscal M.D.   On: 07/25/2016 16:54   Ct Renal Stone Study  Result Date: 07/19/2016 CLINICAL DATA:  Flank pain, possible UTI EXAM: CT ABDOMEN AND PELVIS WITHOUT CONTRAST TECHNIQUE: Multidetector CT imaging of the abdomen and pelvis was performed following the standard protocol without IV contrast. COMPARISON:  07/10/2016 FINDINGS: Lower chest: Small right pleural effusion. Hepatobiliary: Liver is grossly unremarkable. Layering small gallstones (series 2/ image 39). No associated inflammatory changes. Pancreas: Within normal limits. Spleen: Within normal limits. Adrenals/Urinary Tract: Adrenal glands are within normal limits. Kidneys are within normal limits. No renal or ureteral calculi. No hydronephrosis.  Bladder is underdistended. Stable high density material/ calcification along the posterior aspect of the bladder (series 2/ image 66) extending into a prostate TURP defect (series 2/image 71). Stomach/Bowel: Stomach is within normal limits. No evidence of bowel obstruction. Normal appendix (series 2/ image 54). Vascular/Lymphatic: Known thoracoabdominal aortic dissection is poorly evaluated on unenhanced CT, although a displaced intimal calcification is present. 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 Atherosclerotic calcifications of the abdominal aorta and branch vessels. No suspicious abdominopelvic lymphadenopathy. Reproductive: Brachytherapy seeds in the prostate. Postsurgical changes  related to TURP defect with calcifications, as described above. Other: No abdominopelvic ascites. Tiny fat containing periumbilical hernia. Musculoskeletal: Moderate compression fracture deformity at L2 (sagittal image 108), with 50% loss of height, mildly progressed from recent CT. Minimal retropulsion of 1-2 mm. IMPRESSION: Moderate compression fracture deformity at L2, with 50% loss of height, mildly progressed from recent CT. Minimal retropulsion of 1-2 mm. Postsurgical changes related to TURP. Dystrophic calcification along the posterior bladder and extending into the TURP defect. Cholelithiasis, without associated inflammatory changes. Small right pleural effusion. 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 No findings suspicious for metastatic disease. Electronically Signed   By: Julian Hy M.D.   On: 07/19/2016 22:31     Kyphoplasty on 07/05/2016  Subjective: Patient seen and examined at bedside. He denies any overnight fever, nausea, vomiting, chest pain. His back pain is better controlled.  Discharge Exam: Vitals:   07/27/16 2209 07/28/16 0429  BP: 121/79 (!) 168/95  Pulse: 68 74  Resp: 18 16  Temp: 98 F (36.7 C) 97.6 F (36.4 C)   Vitals:   07/27/16 0447 07/27/16 1434 07/27/16 2209 07/28/16 0429  BP: (!) 141/80 (!) 149/92 121/79 (!) 168/95  Pulse: 62 87 68 74  Resp: 16 20 18 16   Temp: 97.5 F (36.4 C) 97.8 F (36.6 C) 98 F (36.7 C) 97.6 F (36.4 C)  TempSrc: Axillary Oral Oral Oral  SpO2: 96% 100% 99% 100%  Weight:      Height:        General: Pt is alert, awake, not in acute distress Cardiovascular:Rate controlled, S1/S2 +, Respiratory: Bilateral decreased breath sounds at bases with some scattered crackles Abdominal: Soft, NT, ND, bowel sounds + Extremities: Trace pedal edema, no cyanosis    The results of significant diagnostics from this hospitalization (including imaging, microbiology, ancillary and laboratory) are listed below for reference.      Microbiology: Recent Results (from the past 240 hour(s))  Urine culture     Status: Abnormal   Collection Time: 07/19/16  8:24 PM  Result Value Ref Range Status   Specimen Description URINE, CLEAN CATCH  Final   Special Requests NONE  Final   Culture 20,000 COLONIES/mL CITROBACTER FREUNDII (A)  Final   Report Status 07/22/2016 FINAL  Final   Organism ID, Bacteria CITROBACTER FREUNDII (A)  Final      Susceptibility   Citrobacter freundii - MIC*    CEFAZOLIN >=64 RESISTANT Resistant     CEFEPIME <=1 SENSITIVE Sensitive     CEFTRIAXONE >=64 RESISTANT Resistant     CIPROFLOXACIN <=0.25 SENSITIVE Sensitive     GENTAMICIN <=1 SENSITIVE Sensitive     IMIPENEM <=0.25 SENSITIVE Sensitive     NITROFURANTOIN <=16 SENSITIVE Sensitive     TRIMETH/SULFA <=20 SENSITIVE Sensitive     PIP/TAZO 64 INTERMEDIATE Intermediate     * 20,000 COLONIES/mL CITROBACTER FREUNDII  Culture, blood (routine x 2)     Status: None  Collection Time: 07/22/16  4:43 PM  Result Value Ref Range Status   Specimen Description BLOOD LEFT ANTECUBITAL  Final   Special Requests   Final    BOTTLES DRAWN AEROBIC AND ANAEROBIC Blood Culture results may not be optimal due to an excessive volume of blood received in culture bottles   Culture   Final    NO GROWTH 5 DAYS Performed at [facility] [address], [place], [place] [address]    Report Status 07/27/2016 FINAL  Final  Culture, blood (routine x 2)     Status: None   Collection Time: 07/22/16  4:43 PM  Result Value Ref Range Status   Specimen Description BLOOD LEFT HAND  Final   Special Requests IN PEDIATRIC BOTTLE Blood Culture adequate volume  Final   Culture   Final    NO GROWTH 5 DAYS Performed at [facility], [place] [address] [place], [place] [address]    Report Status 07/27/2016 FINAL  Final     Labs: BNP (last 3 results)  Recent Labs  09/26/15 1053  BNP 170.0*   Basic Metabolic Panel:  Recent Labs Lab 07/22/16 0531   07/24/16 0519 07/25/16 0527 07/26/16 0537 07/27/16 0511 07/28/16 0520  NA 137  < > 135 136 139 138 140  K 3.6  < > 4.6 4.5 3.5 3.8 3.7  CL 99*  < > 103 98* 100* 99* 99*  CO2 30  < > 26 29 28 30 30   GLUCOSE 126*  < > 127* 167* 112* 114* 117*  BUN 15  < > 23* 23* 22* 24* 24*  CREATININE 0.93  < > 0.90 0.93 0.77 1.01 0.97  CALCIUM 9.2  < > 9.5 9.6 9.7 9.5 9.7  MG 1.7  --   --   --  1.7  --  1.7  < > = values in this interval not displayed. Liver Function Tests: No results for input(s): AST, ALT, ALKPHOS, BILITOT, PROT, ALBUMIN in the last 168 hours. No results for input(s): LIPASE, AMYLASE in the last 168 hours. No results for input(s): AMMONIA in the last 168 hours. CBC:  Recent Labs Lab 07/24/16 0519 07/25/16 0527 07/26/16 0537 07/27/16 0511 07/28/16 0520  WBC 12.4* 11.8* 9.9 10.0 9.1  NEUTROABS  --  10.4*  --   --   --   HGB 12.5* 12.7* 13.0 14.0 14.0  HCT 38.1* 38.6* 39.6 44.1 42.5  MCV 95.3 94.8 94.3 95.2 95.3  PLT 183 216 207 225 227   Cardiac Enzymes: No results for input(s): CKTOTAL, CKMB, CKMBINDEX, TROPONINI in the last 168 hours. BNP: Invalid input(s): POCBNP CBG:  Recent Labs Lab 07/27/16 0826 07/27/16 1115 07/27/16 1718 07/27/16 2208 07/28/16 0752  GLUCAP 112* 137* 133* 104* 137*   D-Dimer No results for input(s): DDIMER in the last 72 hours. Hgb A1c No results for input(s): HGBA1C in the last 72 hours. Lipid Profile No results for input(s): CHOL, HDL, LDLCALC, TRIG, CHOLHDL, LDLDIRECT in the last 72 hours. Thyroid function studies No results for input(s): TSH, T4TOTAL, T3FREE, THYROIDAB in the last 72 hours.  Invalid input(s): FREET3 Anemia work up No results for input(s): VITAMINB12, FOLATE, FERRITIN, TIBC, IRON, RETICCTPCT in the last 72 hours. Urinalysis    Component Value Date/Time   COLORURINE YELLOW 07/19/2016 2024   APPEARANCEUR HAZY (A) 07/19/2016 2024   LABSPEC 1.014 07/19/2016 2024   PHURINE 7.0 07/19/2016 2024   GLUCOSEU  NEGATIVE 07/19/2016 2024   GLUCOSEU NEGATIVE 07/15/2016 1152  HGBUR LARGE (A) 07/19/2016 2024   BILIRUBINUR NEGATIVE 07/19/2016 2024   BILIRUBINUR negative 07/07/2016 1732   KETONESUR 80 (A) 07/19/2016 2024   PROTEINUR 30 (A) 07/19/2016 2024   UROBILINOGEN 1.0 07/15/2016 1152   NITRITE NEGATIVE 07/19/2016 2024   LEUKOCYTESUR LARGE (A) 07/19/2016 2024   Sepsis Labs Invalid input(s): PROCALCITONIN,  WBC,  LACTICIDVEN Microbiology Recent Results (from the past 240 hour(s))  Urine culture     Status: Abnormal   Collection Time: 07/19/16  8:24 PM  Result Value Ref Range Status   Specimen Description URINE, CLEAN CATCH  Final   Special Requests NONE  Final   Culture 20,000 COLONIES/mL CITROBACTER FREUNDII (A)  Final   Report Status 07/22/2016 FINAL  Final   Organism ID, Bacteria CITROBACTER FREUNDII (A)  Final      Susceptibility   Citrobacter freundii - MIC*    CEFAZOLIN >=64 RESISTANT Resistant     CEFEPIME <=1 SENSITIVE Sensitive     CEFTRIAXONE >=64 RESISTANT Resistant     CIPROFLOXACIN <=0.25 SENSITIVE Sensitive     GENTAMICIN <=1 SENSITIVE Sensitive     IMIPENEM <=0.25 SENSITIVE Sensitive     NITROFURANTOIN <=16 SENSITIVE Sensitive     TRIMETH/SULFA <=20 SENSITIVE Sensitive     PIP/TAZO 64 INTERMEDIATE Intermediate     * 20,000 COLONIES/mL CITROBACTER FREUNDII  Culture, blood (routine x 2)     Status: None   Collection Time: 07/22/16  4:43 PM  Result Value Ref Range Status   Specimen Description BLOOD LEFT ANTECUBITAL  Final   Special Requests   Final    BOTTLES DRAWN AEROBIC AND ANAEROBIC Blood Culture results may not be optimal due to an excessive volume of blood received in culture bottles   Culture   Final    NO GROWTH 5 DAYS Performed at [facility], [place] [address], [place], [place] [address]    Report Status 07/27/2016 FINAL  Final  Culture, blood (routine x 2)     Status: None   Collection Time: 07/22/16  4:43 PM  Result Value Ref Range Status    Specimen Description BLOOD LEFT HAND  Final   Special Requests IN PEDIATRIC BOTTLE Blood Culture adequate volume  Final   Culture   Final    NO GROWTH 5 DAYS Performed at [facility], [place] [address], [place], [place] [address]    Report Status 07/27/2016 FINAL  Final     Time coordinating discharge: 40 minutes  SIGNED:   Aline August, MD  [facility] 07/28/2016, 9:01 AM Pager: [phone]  If 7PM-7AM, please contact night-coverage [email or web address] Password [email or web address]

## 2016-07-28 NOTE — Progress Notes (Signed)
[facility] for Heparin/Warfarin Indication: atrial fibrillation  Allergies  Allergen Reactions  . Metoprolol Other (See Comments)    Reaction:  Bradycardia with 25mg  BID dose    Patient Measurements: Height: 5\' 8"  (172.7 cm) Weight: 196 lb 13.9 oz (89.3 kg) IBW/kg (Calculated) : 68.4 Heparin Dosing Weight: 87 kg  Vital Signs: Temp: 97.6 F (36.4 C) (05/28 0429) Temp Source: Oral (05/28 0429) BP: 168/95 (05/28 0429) Pulse Rate: 74 (05/28 0429)  Labs:  Recent Labs  07/26/16 0537 07/26/16 1541 07/27/16 0511 07/28/16 0520  HGB 13.0  --  14.0 14.0  HCT 39.6  --  44.1 42.5  PLT 207  --  225 227  LABPROT 15.3*  --  15.5* 16.6*  INR 1.21  --  1.23 1.33  HEPARINUNFRC 0.27* 0.37 0.38 0.59  CREATININE 0.77  --  1.01 0.97    Estimated Creatinine Clearance: 61.6 mL/min (by C-G formula based on SCr of 0.97 mg/dL).  Assessment: 83 yr male admitted on 5/20 with low back pain (xray shows L2 compression fracture).  PMH significant for AFib and patient on warfarin PTA and UTI (recently completed course of antibiotics).  Admission INR = 5.31 with warfarin regimen of 4mg  daily except 6mg  on Thursdays (last dose taken 5/19)   Vitamin K 10mg  IV x 1 given 5/21  Significant events:  Started heparin 5/22  Heparin held 5/25 for kyphoplasty, resumed in PM  Ok to resume warfarin 5/26  Today, 07/28/2016  Heparin level 0.59 units/mL, remains therapeutic on heparin infusion at 900 units/hr  CBC: Hgb, Pltc stable/WNL  INR: 1.33, remains subtherapeutic, but trending up after 2 doses of warfarin 5mg . Effects of vitamin K on 5/21 should be diminished by now  No bleeding or line issues reported by nursing  Drug interactions: cipro can increase INR  Goal of Therapy:  Heparin level 0.3-0.7 units/ml Monitor platelets by anticoagulation protocol: Yes  INR 2-3   Plan:   Continue heparin infusion at 900 units/hr (could transition to Lovenox 90mg  SQ  q12h if no further invasive procedures planned)  Warfarin 5mg  PO x 1 at noon today - monitor closely for interaction with cipro (expect that last day of therapy will be today to complete 7 days total therapy for Citrobacter UTI since cefepime started 5/22)  Heparin level, CBC and PT/INR daily  Monitor for signs of bleeding or thrombosis   Lindell Spar, PharmD, BCPS Pager: [phone] 07/28/2016 7:22 AM

## 2016-07-28 NOTE — Care Management Note (Signed)
Case Management Note  Patient Details  Name: Eric Rivers MRN: [id number] Date of Birth: April 13, 1930  Subjective/Objective:    D/c SNF-CSW following.                Action/Plan:d/c SNF.   Expected Discharge Date:  07/28/16               Expected Discharge Plan:  Skilled Nursing Facility  In-House Referral:  Clinical Social Work  Discharge planning Services  CM Consult  Post Acute Care Choice:    Choice offered to:  Patient  DME Arranged:    DME Agency:     HH Arranged:    [place] Agency:  Other - See comment ([facility]-has own HHPT)  Status of Service:  Completed, signed off  If discussed at [place] of Stay Meetings, dates discussed:    Additional Comments:  Dessa Phi, RN 07/28/2016, 11:05 AM

## 2016-07-28 NOTE — Clinical Social Work Placement (Addendum)
Patient received and accepted bed offer at [facility]. [facility] contacted, family aware. Patient's RN can call report to [phone] ext. [phone] or ask for health care nursing office.   CLINICAL SOCIAL WORK PLACEMENT  NOTE  Date:  07/28/2016  Patient Details  Name: Eric Rivers MRN: [id number] Date of Birth: 07-Dec-1930  Clinical Social Work is seeking post-discharge placement for this patient at the [place] level of care (*CSW will initial, date and re-position this form in  chart as items are completed):  Yes   Patient/family provided with [place] Work Department's list of facilities offering this level of care within the geographic area requested by the patient (or if unable, by the patient's family).  Yes   Patient/family informed of their freedom to choose among providers that offer the needed level of care, that participate in Medicare, Medicaid or managed care program needed by the patient, have an available bed and are willing to accept the patient.  Yes   Patient/family informed of [facility]'s ownership interest in [facility] and [facility], as well as of the fact that they are under no obligation to receive care at these facilities.  PASRR submitted to EDS on 07/24/16     PASRR number received on 07/24/16     Existing PASRR number confirmed on       FL2 transmitted to all facilities in geographic area requested by pt/family on 07/23/16     FL2 transmitted to all facilities within larger geographic area on       Patient informed that his/her managed care company has contracts with or will negotiate with certain facilities, including the following:        Yes   Patient/family informed of bed offers received.  Patient chooses bed at [facility]     Physician recommends and patient chooses bed at      Patient to be transferred to [facility] on 07/28/16.  Patient to be transferred to facility by PTAR      Patient family notified on 07/28/16 of transfer.  Name of family member notified:  Jim Menius      PHYSICIAN       Additional Comment:    _______________________________________________ Burnis Medin, LCSW 07/28/2016, 1:43 PM

## 2016-07-29 ENCOUNTER — Telehealth: Payer: Self-pay | Admitting: *Deleted

## 2016-07-29 ENCOUNTER — Non-Acute Institutional Stay (SKILLED_NURSING_FACILITY): Payer: Medicare Other | Admitting: Nurse Practitioner

## 2016-07-29 ENCOUNTER — Encounter: Payer: Self-pay | Admitting: Nurse Practitioner

## 2016-07-29 DIAGNOSIS — N390 Urinary tract infection, site not specified: Secondary | ICD-10-CM | POA: Diagnosis not present

## 2016-07-29 DIAGNOSIS — N3000 Acute cystitis without hematuria: Secondary | ICD-10-CM

## 2016-07-29 DIAGNOSIS — D6832 Hemorrhagic disorder due to extrinsic circulating anticoagulants: Secondary | ICD-10-CM

## 2016-07-29 DIAGNOSIS — I5032 Chronic diastolic (congestive) heart failure: Secondary | ICD-10-CM

## 2016-07-29 DIAGNOSIS — G4700 Insomnia, unspecified: Secondary | ICD-10-CM

## 2016-07-29 DIAGNOSIS — R269 Unspecified abnormalities of gait and mobility: Secondary | ICD-10-CM

## 2016-07-29 DIAGNOSIS — D649 Anemia, unspecified: Secondary | ICD-10-CM

## 2016-07-29 DIAGNOSIS — I10 Essential (primary) hypertension: Secondary | ICD-10-CM

## 2016-07-29 DIAGNOSIS — I482 Chronic atrial fibrillation, unspecified: Secondary | ICD-10-CM

## 2016-07-29 DIAGNOSIS — N179 Acute kidney failure, unspecified: Secondary | ICD-10-CM | POA: Diagnosis not present

## 2016-07-29 DIAGNOSIS — K219 Gastro-esophageal reflux disease without esophagitis: Secondary | ICD-10-CM

## 2016-07-29 DIAGNOSIS — E1140 Type 2 diabetes mellitus with diabetic neuropathy, unspecified: Secondary | ICD-10-CM | POA: Diagnosis not present

## 2016-07-29 DIAGNOSIS — R609 Edema, unspecified: Secondary | ICD-10-CM

## 2016-07-29 DIAGNOSIS — T45515A Adverse effect of anticoagulants, initial encounter: Secondary | ICD-10-CM

## 2016-07-29 DIAGNOSIS — K5900 Constipation, unspecified: Secondary | ICD-10-CM | POA: Insufficient documentation

## 2016-07-29 DIAGNOSIS — M109 Gout, unspecified: Secondary | ICD-10-CM

## 2016-07-29 DIAGNOSIS — Z7901 Long term (current) use of anticoagulants: Secondary | ICD-10-CM | POA: Diagnosis not present

## 2016-07-29 DIAGNOSIS — M5410 Radiculopathy, site unspecified: Secondary | ICD-10-CM | POA: Diagnosis not present

## 2016-07-29 NOTE — Assessment & Plan Note (Signed)
Heart rate is in control, continue Amlodipine 2.5mg , Coumadin per PT/INR, INR goal is 2-3.

## 2016-07-29 NOTE — Assessment & Plan Note (Signed)
Update CBC BMP

## 2016-07-29 NOTE — Progress Notes (Signed)
Location:  [facility] Room Number: 32 Place of Service:  SNF ([phone]) Provider:  Mast, Manxie  NP  Estill Dooms, MD  Patient Care Team: Estill Dooms, MD as PCP - General (Internal Medicine) Carlena Bjornstad, MD (Cardiology) Earnie Larsson, MD (Neurosurgery) Rutherford Guys, MD (Ophthalmology) Renda Rolls, Jennefer Bravo, MD as Referring Physician (Dermatology) Alexis Frock, MD as Consulting Physician (Urology) Dorothy Spark, MD as Consulting Physician (Cardiology)  Extended Emergency Contact Information Primary Emergency Contact: [place], [place] of [place] Phone: [phone] Mobile Phone: [phone] Relation: Friend Secondary Emergency Contact: Ervin Knack of [place] Phone: [phone] Relation: Friend  Code Status:  DNR Goals of care: Advanced Directive information Advanced Directives 07/29/2016  Does Patient Have a Medical Advance Directive? Yes  Type of Advance Directive -  Does patient want to make changes to medical advance directive? No - Patient declined  Copy of [place] in Chart? No - copy requested  Would patient like information on creating a medical advance directive? No - Patient declined  Pre-existing out of facility DNR order (yellow form or pink MOST form) -     Chief Complaint  Patient presents with  . Acute Visit    HPI:  Pt is a 83 y.o. male seen today for an acute visit for    Past Medical History:  Diagnosis Date  . ACE-inhibitor cough   . Aortic dissection [facility])    Surgical repair February, 2014  . Aortic stenosis    mild....echo... september... 2010/ mild... echo.[name] KitchenMarland KitchenDec, 2011  . Atrial fibrillation (HCC)     Chronic,   24 hour holter, September, 2011.... atrial fib rate is controlled.... there is some bradycardia but  no marked pauses  . Bladder cancer [facility])    recurrence with TUR-B March '09  . DDD (degenerative disc disease)    lumbar spine  . Diabetes mellitus    type 2  . Diabetes mellitus with neuropathy ([place]) 12/04/2006   Qualifier: Diagnosis of  By: Linda Hedges MD, Heinz Knuckles  medications - DPP4   . Diverticulosis of colon with hemorrhage 01/01/2014  . Ejection fraction    EF 60%, echo, 01/2010  . Fluid overload   . Fracture of metacarpal bone 11/30/11  . GERD (gastroesophageal reflux disease)   . Gout   . History of echocardiogram    Echo 5/17 - mild LVH, EF 55-60%, mod AS (mean 16 mmHg, peak 29 mmHg), MAC, midl MR, massive BAE, mod dilated RV, low normal RVSF, mod TR, PASP 64 mmHg  . Hx of colonoscopy    approx. 10 years  . Hypercholesterolemia   . Hyperlipidemia   . Hypertension   . Mitral regurgitation   . Normal nuclear stress test    06/2005 , also ABI normal 2007  . Prostate cancer ([place])    radiation tx.  . Pulmonary hypertension ([place])    9mHg, echo, 01/2010  . Shortness of breath    on exertion  . Warfarin anticoagulation    Atrial fib  . Whooping cough    Past Surgical History:  Procedure Laterality Date  . ASCENDING AORTIC ROOT REPLACEMENT N/A 04/15/2012   Procedure: ASCENDING AORTIC ROOT REPLACEMENT;  Surgeon: BGaye Pollack MD;  [place]  Service: Open Heart Surgery;  Laterality: N/A;  . bladder cancer biopsy  10/16/2010   negative for malignancy  . bladder microscopic  04/13/2007  high grade papillary urothelial lesions  . COLONOSCOPY N/A 01/01/2014   Procedure: COLONOSCOPY;  Surgeon: Gatha Mayer, MD;  Location: [facility];  Service: Endoscopy;  Laterality: N/A;  . CYSTOSCOPY/RETROGRADE/URETEROSCOPY Bilateral 04/04/2013   Procedure: CYSTOSCOPY WITH RETROGRADE PYELOGRAM AND BLADDER BIOPSY;  Surgeon: Molli Hazard, MD;  Location: [facility];  Service: Urology;  Laterality: Bilateral;  . 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 EXPLORATION POST OPERATIVE OPEN HEART  04/2012  . Herniatic disc surgery    . IR KYPHO LUMBAR INC FX REDUCE BONE  BX UNI/BIL CANNULATION INC/IMAGING  07/25/2016  . LUMBAR LAMINECTOMY     '02  . PROSTATE BIOPSY  09/25/2011   GLEASON 3+3=6 AND 4+3=7  . TEE WITHOUT CARDIOVERSION N/A 04/15/2012   Procedure: TRANSESOPHAGEAL ECHOCARDIOGRAM (TEE);  Surgeon: Gaye Pollack, MD;  Location: [facility];  Service: Open Heart Surgery;  Laterality: N/A;  . TRANSURETHRAL RESECTION OF BLADDER TUMOR N/A 06/13/2015   Procedure: TRANSURETHRAL RESECTION OF PROSTATE (TURP) CLOT EVACUATION AND FULGERATION, BLADDER STONE REMOVAL ;  Surgeon: Alexis Frock, MD;  Location: [facility];  Service: Urology;  Laterality: N/A;  . TUR-BT '06, '09      Allergies  Allergen Reactions  . Metoprolol Other (See Comments)    Reaction:  Bradycardia with 85m BID dose    Outpatient Encounter Prescriptions as of 07/29/2016  Medication Sig  . acetaminophen (TYLENOL) 500 MG tablet Take 1,000 mg by mouth every 8 (eight) hours as needed for mild pain, moderate pain, fever or headache.   . allopurinol (ZYLOPRIM) 300 MG tablet take 1 tablet by mouth once daily  . amLODipine (NORVASC) 2.5 MG tablet take 1 tablet by mouth once daily  . atorvastatin (LIPITOR) 20 MG tablet take 1 tablet by mouth once daily  . Calcium Carbonate-Vitamin D (CALCIUM 600+D) 600-400 MG-UNIT tablet Take 1 tablet by mouth at bedtime.  .[name] Kitchendesonide (DESOWEN) 0.05 % lotion Apply 1 application topically daily as needed (rosacea).   . docusate sodium (COLACE) 100 MG capsule Take 1 capsule (100 mg total) by mouth every 12 (twelve) hours.  . ferrous sulfate 325 (65 FE) MG tablet Take 650 mg by mouth daily with breakfast.  . finasteride (PROSCAR) 5 MG tablet Take 1 tablet (5 mg total) by mouth every morning.  . furosemide (LASIX) 40 MG tablet Take 1 tablet (40 mg total) by mouth daily.  .[name] Kitchengabapentin (NEURONTIN) 100 MG capsule Take 1 capsule (100 mg total) by mouth 3 (three) times daily.  .[name] KitchenJANUVIA 100 MG tablet take 1 tablet by mouth once daily  . ketoconazole (NIZORAL) 2 % shampoo Apply 1  application topically 4 (four) times a week.  . mirabegron ER (MYRBETRIQ) 50 MG TB24 tablet Take 50 mg by mouth daily.  .[name] KitchenoxyCODONE (ROXICODONE) 5 MG immediate release tablet Take 1 tablet (5 mg total) by mouth every 6 (six) hours as needed for severe pain.  . polyethylene glycol (MIRALAX / GLYCOLAX) packet Take 17 g by mouth daily as needed.  . potassium chloride (K-DUR,KLOR-CON) 10 MEQ tablet take 1 tablet by mouth once daily  . senna (SENOKOT) 8.6 MG tablet Take 1 tablet by mouth at bedtime.   . Tamsulosin HCl (FLOMAX) 0.4 MG CAPS Take 0.4 mg by mouth at bedtime.   .[name] KitchentiZANidine (ZANAFLEX) 4 MG tablet Take 1 tablet (4 mg total) by mouth every 6 (six) hours as needed for muscle spasms.  . traMADol (ULTRAM) 50 MG tablet Take 1 tablet (50  mg total) by mouth every 6 (six) hours as needed for moderate pain.  . traZODone (DESYREL) 50 MG tablet Take 1 tablet (50 mg total) by mouth See admin instructions. (Patient taking differently: Take 50 mg by mouth at bedtime. )  . warfarin (COUMADIN) 5 MG tablet Take 1 tablet (5 mg total) by mouth daily at 6 PM.  . [DISCONTINUED] Multiple Vitamins-Minerals (ONCOVITE PO) Take 1 tablet by mouth daily.   . [DISCONTINUED] pantoprazole (PROTONIX) 40 MG tablet take 1 tablet by mouth once daily   No facility-administered encounter medications on file as of 07/29/2016.     Review of Systems  Immunization History  Administered Date(s) Administered  . H1N1 03/07/2008  . Influenza Split 12/04/2010, 11/10/2011  . Influenza Whole 12/06/2007, 11/29/2008, 11/12/2009  . Influenza, High Dose Seasonal PF 12/02/2012  . Influenza,inj,Quad PF,36+ Mos 10/26/2013  . Influenza-Unspecified 11/10/2014, 12/04/2015  . Pneumococcal Conjugate-13 04/11/2013  . Pneumococcal Polysaccharide-23 06/03/2006  . Td 03/07/2008  . Zoster 11/14/2009   Pertinent  Health Maintenance Due  Topic Date Due  . OPHTHALMOLOGY EXAM  11/30/2015  . URINE MICROALBUMIN  04/09/2016  . INFLUENZA VACCINE   10/01/2016  . FOOT EXAM  01/14/2017  . HEMOGLOBIN A1C  01/22/2017  . PNA vac Low Risk Adult  Completed   Fall Risk  07/07/2016 01/15/2016 01/04/2016 10/19/2015 04/10/2015  Falls in the past year? No Yes Yes No No  Number falls in past yr: - 1 1 - -  Injury with Fall? - No No - -  Risk for fall due to : - Impaired balance/gait;Impaired mobility - - -  Follow up - Falls prevention discussed - - -   Functional Status Survey:    Vitals:   07/29/16 1208  BP: 125/90  Pulse: 68  Resp: 16  Temp: (!) 96.9 F (36.1 C)  Weight: 196 lb 6.4 oz (89.1 kg)  Height: 5' 8"  (1.727 m)   Body mass index is 29.86 kg/m. Physical Exam  Labs reviewed:  Recent Labs  07/22/16 0531  07/26/16 0537 07/27/16 0511 07/28/16 0520  NA 137  < > 139 138 140  K 3.6  < > 3.5 3.8 3.7  CL 99*  < > 100* 99* 99*  CO2 30  < > 28 30 30   GLUCOSE 126*  < > 112* 114* 117*  BUN 15  < > 22* 24* 24*  CREATININE 0.93  < > 0.77 1.01 0.97  CALCIUM 9.2  < > 9.7 9.5 9.7  MG 1.7  --  1.7  --  1.7  < > = values in this interval not displayed.  Recent Labs  04/07/16 1311 07/07/16 1725 07/10/16 1546  AST 21 25 38  ALT 13 16 27   ALKPHOS 96 80 81  BILITOT 1.49* 0.9 1.8*  PROT 8.0 7.2 7.9  ALBUMIN 4.2 4.4 4.4    Recent Labs  07/10/16 1546  07/20/16 0505  07/25/16 0527 07/26/16 0537 07/27/16 0511 07/28/16 0520  WBC 15.9*  < > 12.2*  < > 11.8* 9.9 10.0 9.1  NEUTROABS 13.6*  --  9.7*  --  10.4*  --   --   --   HGB 14.3  < > 13.2  < > 12.7* 13.0 14.0 14.0  HCT 41.7  < > 40.6  < > 38.6* 39.6 44.1 42.5  MCV 92.3  < > 95.3  < > 94.8 94.3 95.2 95.3  PLT 252  < > 197  < > 216 207 225 227  < > =  values in this interval not displayed. Lab Results  Component Value Date   TSH 1.48 04/10/2015   Lab Results  Component Value Date   HGBA1C 5.9 (H) 07/22/2016   Lab Results  Component Value Date   CHOL 102 04/10/2015   HDL 45.60 04/10/2015   LDLCALC 45 04/10/2015   TRIG 57.0 04/10/2015   CHOLHDL 2 04/10/2015     Significant Diagnostic Results in last 30 days:  Ct Abdomen Pelvis Wo Contrast  Result Date: 07/10/2016 CLINICAL DATA:  Low back and bilateral hip pain.  Recent fall. EXAM: CT ABDOMEN AND PELVIS WITHOUT CONTRAST TECHNIQUE: Multidetector CT imaging of the abdomen and pelvis was performed following the standard protocol without IV contrast. COMPARISON:  06/11/2015 FINDINGS: Lower chest:  Subsegmental atelectasis in the lower lobes. Hepatobiliary: Nodular liver contour suggests cirrhosis. No focal abnormality in the liver on this study without intravenous contrast. Multiple calcified gallstones identified measuring around 5 mm each. No intrahepatic or extrahepatic biliary dilation. Pancreas: No focal mass lesion. No dilatation of the main duct. No intraparenchymal cyst. No peripancreatic edema. Spleen: No splenomegaly.  Tiny subcapsular cyst again noted. Adrenals/Urinary Tract: No adrenal nodule or mass. 19 mm cystic lesion upper pole right kidney incompletely characterized on today's study but similar in appearance to 06/11/2015. Left kidney unremarkable. No evidence for hydroureter. Dystrophic calcification is identified in the posterior bladder wall extending to the central prostate gland, potentially related to prior TURBT. Stomach/Bowel: Stomach is nondistended. No gastric wall thickening. No evidence of outlet obstruction. Duodenum is normally positioned as is the ligament of Treitz. No small bowel wall thickening. No small bowel dilatation. The terminal ileum is normal. The appendix is normal. No gross colonic mass. No colonic wall thickening. No substantial diverticular change. Vascular/Lymphatic: Chronic thoracoabdominal aortic dissection again noted, incompletely visualized. There is no gastrohepatic or hepatoduodenal ligament lymphadenopathy. No intraperitoneal or retroperitoneal lymphadenopathy. No pelvic sidewall lymphadenopathy. Reproductive: Brachytherapy seeds noted prostate gland. Other: No  intraperitoneal free fluid. Musculoskeletal: Small groin hernias contain only fat. Bone windows reveal no worrisome lytic or sclerotic osseous lesions. Compression deformity at the L2 level is age indeterminate on today's study but new since 06/11/2015. IMPRESSION: 1. No acute findings in the abdomen or pelvis. 2. Interval development of a large dystrophic calcification associated with the posterior bladder wall and central prostate, potentially related to the urethra. 3. Chronic thoracoabdominal aortic dissection. 4. Age-indeterminate L2 compression deformity is new since 06/11/2015. 5. Cholelithiasis. 6. Nodular liver contour suggests cirrhosis. Electronically Signed   By: Misty Stanley M.D.   On: 07/10/2016 16:39   Dg Lumbar Spine Complete  Result Date: 07/19/2016 CLINICAL DATA:  Severe low back pain chronically. EXAM: LUMBAR SPINE - COMPLETE 4+ VIEW COMPARISON:  CT 07/10/2016 and 06/11/2015 FINDINGS: Diffuse osteopenia. Mild spondylosis throughout the lumbar spine. Subtle grade 1 anterolisthesis of L4 on L5 likely due to the moderate facet arthropathy. Disc space narrowing at the L4-5 level. Moderate L2 compression fracture unchanged from recent CT and new compared to 06/11/2015. Calcified plaque over the abdominal aorta. IMPRESSION: Moderate L2 compression fracture age indeterminate, unchanged from the recent CT and new since 06/11/2015. Mild spondylosis of the lumbar spine with disc disease at the L4-5 level. Grade 1 anterolisthesis of L4 on L5 due to the moderate facet arthropathy. Electronically Signed   By: Marin Olp M.D.   On: 07/19/2016 21:04   Mr Thoracic Spine Wo Contrast  Result Date: 07/14/2016 CLINICAL DATA:  Progressive thoracic and lumbar pain. EXAM: MRI THORACIC AND LUMBAR  SPINE WITHOUT CONTRAST TECHNIQUE: Multiplanar and multiecho pulse sequences of the thoracic and lumbar spine were obtained without intravenous contrast. COMPARISON:  CT scan of the abdomen and pelvis dated 07/10/2016  and CT scan of the chest dated 11/21/2015 FINDINGS: MRI THORACIC SPINE FINDINGS Alignment:  Physiologic. Vertebrae: No fracture, evidence of discitis, or bone lesion. Cord:  Normal signal and morphology. Paraspinal and other soft tissues: Aneurysmal dilatation of the thoracic aorta including the ascending and descending portions with a chronic type B dissection of the descending thoracic aorta extending into the abdominal aorta. Disc levels: Diffuse desiccation of the discs. No significant disc bulging. No disc protrusions or neural impingement. No foraminal or spinal stenosis or significant facet joint disease. MRI LUMBAR SPINE FINDINGS Segmentation:  Standard. Alignment:  Physiologic. Vertebrae: Benign-appearing mild compression fracture of L2. Minimal protrusion of the posterior margin of L2 into the spinal canal without neural impingement. Conus medullaris: Extends to the L1-2 level and appears normal. Paraspinal and other soft tissues: Chronic dissection of the abdominal aorta extending into the left common iliac artery, appearing unchanged since the prior CT scan. Cholelithiasis. Disc levels: L1-2:  Tiny broad-based disc bulge with no neural impingement. L2-3:  Tiny disc bulge into the left neural foramen. L3-4: Tiny broad-based disc bulge asymmetric to the left without neural impingement. L4-5: Disc space narrowing. Small broad-based disc osteophyte complex without neural impingement, extending into both neural foramina. This could affect the right L4 nerve. L5-S1: Tiny disc bulge asymmetric to the left with no neural impingement. Small Tarlov cyst at S2. IMPRESSION: MR THORACIC SPINE IMPRESSION No significant abnormalities of the thoracic spine. Chronic thoracolumbar aortic dissection and aneurysmal dilatation of the ascending thoracic aorta. MR LUMBAR SPINE IMPRESSION Benign appearing acute or subacute compression fracture of L2 without neural impingement. Degenerative disc disease primarily at L4-5 with  moderate right foraminal stenosis which could affect the L4 nerve. Electronically Signed   By: Lorriane Shire M.D.   On: 07/14/2016 08:51   Mr Lumbar Spine Wo Contrast  Result Date: 07/14/2016 CLINICAL DATA:  Progressive thoracic and lumbar pain. EXAM: MRI THORACIC AND LUMBAR SPINE WITHOUT CONTRAST TECHNIQUE: Multiplanar and multiecho pulse sequences of the thoracic and lumbar spine were obtained without intravenous contrast. COMPARISON:  CT scan of the abdomen and pelvis dated 07/10/2016 and CT scan of the chest dated 11/21/2015 FINDINGS: MRI THORACIC SPINE FINDINGS Alignment:  Physiologic. Vertebrae: No fracture, evidence of discitis, or bone lesion. Cord:  Normal signal and morphology. Paraspinal and other soft tissues: Aneurysmal dilatation of the thoracic aorta including the ascending and descending portions with a chronic type B dissection of the descending thoracic aorta extending into the abdominal aorta. Disc levels: Diffuse desiccation of the discs. No significant disc bulging. No disc protrusions or neural impingement. No foraminal or spinal stenosis or significant facet joint disease. MRI LUMBAR SPINE FINDINGS Segmentation:  Standard. Alignment:  Physiologic. Vertebrae: Benign-appearing mild compression fracture of L2. Minimal protrusion of the posterior margin of L2 into the spinal canal without neural impingement. Conus medullaris: Extends to the L1-2 level and appears normal. Paraspinal and other soft tissues: Chronic dissection of the abdominal aorta extending into the left common iliac artery, appearing unchanged since the prior CT scan. Cholelithiasis. Disc levels: L1-2:  Tiny broad-based disc bulge with no neural impingement. L2-3:  Tiny disc bulge into the left neural foramen. L3-4: Tiny broad-based disc bulge asymmetric to the left without neural impingement. L4-5: Disc space narrowing. Small broad-based disc osteophyte complex without neural impingement, extending into  both neural foramina.  This could affect the right L4 nerve. L5-S1: Tiny disc bulge asymmetric to the left with no neural impingement. Small Tarlov cyst at S2. IMPRESSION: MR THORACIC SPINE IMPRESSION No significant abnormalities of the thoracic spine. Chronic thoracolumbar aortic dissection and aneurysmal dilatation of the ascending thoracic aorta. MR LUMBAR SPINE IMPRESSION Benign appearing acute or subacute compression fracture of L2 without neural impingement. Degenerative disc disease primarily at L4-5 with moderate right foraminal stenosis which could affect the L4 nerve. Electronically Signed   By: Lorriane Shire M.D.   On: 07/14/2016 08:51   Ir Kypho Lumbar Inc Fx Reduce Bone Bx Uni/bil Cannulation Inc/imaging  Result Date: 07/25/2016 CLINICAL DATA:  Symptomatic L2 compression fracture. EXAM: FLUOROSCOPIC GUIDED KYPHOPLASTY OF THE L2 VERTEBRAL BODY COMPARISON:  CT abdomen pelvis - 07/19/2016; 07/10/2016; lumbar spine MRI - 07/14/2016 MEDICATIONS: The patient is currently admitted to the hospital and receiving intravenous antibiotics. Antibiotics were administered with an appropriate time frame prior to this skin puncture. ANESTHESIA/SEDATION: Moderate (conscious) sedation was employed during this procedure. A total of Versed 6.5 mg and Fentanyl 150 mcg was administered intravenously. Moderate Sedation Time: 57 minutes. The patient's level of consciousness and vital signs were monitored continuously by radiology nursing throughout the procedure under my direct supervision. FLUOROSCOPY TIME:  14 min, 6 seconds (1194 mGy) COMPLICATIONS: None immediate. TECHNIQUE: The procedure, risks (including but not limited to bleeding, infection, organ damage), benefits, and alternatives were explained to the patient. Questions regarding the procedure were encouraged and answered. The patient understands and consents to the procedure. The patient was placed prone on the fluoroscopic table. The skin overlying the upper thoracic region was then  prepped and draped in the usual sterile fashion. Maximal barrier sterile technique was utilized including caps, mask, sterile gowns, sterile gloves, sterile drape, hand hygiene and skin antiseptic. Intravenous Fentanyl and Versed were administered as conscious sedation during continuous cardiorespiratory monitoring by the radiology RN. The left pedicle at L2 was then infiltrated with 1% lidocaine followed by the advancement of a Kyphon trocar needle through the left pedicle into the posterior one-third of the vertebral body. Subsequently, the osteo drill was advanced to the anterior third of the vertebral body. The osteo drill was retracted. Through the working cannula, a Kyphon inflatable bone tamp 15 x 3 was advanced and positioned with the distal marker approximately 5 mm from the anterior aspect of the cortex. Appropriate positioning was confirmed on the AP projection. At this time, the balloon was expanded using contrast via a Kyphon inflation syringe device via micro tubing. In similar fashion, the right L2 pedicle was infiltrated with 1% lidocaine followed by the advancement of a second Kyphon trocar needle through the right pedicle into the posterior third of the vertebral body. Subsequently, the osteo drill was coaxially advanced to the anterior right third. The osteo drill was exchanged for a Kyphon inflatable bone tamp 15 x 3, advanced to the 5 mm of the anterior aspect of the cortex. The balloon was then expanded using contrast as above. Inflations were continued until there was near apposition with the superior end plate. At this time, methylmethacrylate mixture was reconstituted in the Kyphon bone mixing device system. This was then loaded into the delivery mechanism, attached to Kyphon bone fillers. The balloons were deflated and removed followed by the instillation of methylmethacrylate mixture with excellent filling in the AP and lateral projections. No extravasation was noted in the disk spaces or  posteriorly into the spinal canal. No epidural venous  contamination was seen. The working cannulae and the bone filler were then retrieved and removed. Hemostasis was achieved with manual compression. The patient tolerated the procedure well without immediate postprocedural complication. IMPRESSION: 1. Technically successful L2 vertebral body augmentation using balloon kyphoplasty. 2. Per CMS PQRS reporting requirements (PQRS Measure 24): Given the patient's age of greater than 58 and the fracture site (hip, distal radius, or spine), the patient should be tested for osteoporosis using DXA, and the appropriate treatment considered based on the DXA results. Electronically Signed   By: Sandi Mariscal M.D.   On: 07/25/2016 16:54   Ct Renal Stone Study  Result Date: 07/19/2016 CLINICAL DATA:  Flank pain, possible UTI EXAM: CT ABDOMEN AND PELVIS WITHOUT CONTRAST TECHNIQUE: Multidetector CT imaging of the abdomen and pelvis was performed following the standard protocol without IV contrast. COMPARISON:  07/10/2016 FINDINGS: Lower chest: Small right pleural effusion. Hepatobiliary: Liver is grossly unremarkable. Layering small gallstones (series 2/ image 68). No associated inflammatory changes. Pancreas: Within normal limits. Spleen: Within normal limits. Adrenals/Urinary Tract: Adrenal glands are within normal limits. Kidneys are within normal limits. No renal or ureteral calculi. No hydronephrosis. Bladder is underdistended. Stable high density material/ calcification along the posterior aspect of the bladder (series 2/ image 66) extending into a prostate TURP defect (series 2/image 71). Stomach/Bowel: Stomach is within normal limits. No evidence of bowel obstruction. Normal appendix (series 2/ image 54). Vascular/Lymphatic: Known thoracoabdominal aortic dissection is poorly evaluated on unenhanced CT, although a displaced intimal calcification is present. 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 Atherosclerotic calcifications of the abdominal aorta and branch vessels. No suspicious abdominopelvic lymphadenopathy. Reproductive: Brachytherapy seeds in the prostate. Postsurgical changes related to TURP defect with calcifications, as described above. Other: No abdominopelvic ascites. Tiny fat containing periumbilical hernia. Musculoskeletal: Moderate compression fracture deformity at L2 (sagittal image 108), with 50% loss of height, mildly progressed from recent CT. Minimal retropulsion of 1-2 mm. IMPRESSION: Moderate compression fracture deformity at L2, with 50% loss of height, mildly progressed from recent CT. Minimal retropulsion of 1-2 mm. Postsurgical changes related to TURP. Dystrophic calcification along the posterior bladder and extending into the TURP defect. Cholelithiasis, without associated inflammatory changes. Small right pleural effusion. 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 No findings suspicious for metastatic disease. Electronically Signed   By: Julian Hy M.D.   On: 07/19/2016 22:31    Assessment/Plan There are no diagnoses linked to this encounter.   Family/ staff Communication:   Labs/tests ordered:

## 2016-07-29 NOTE — Assessment & Plan Note (Signed)
no urinary retention, taking Myrbetriq, Finasteride 5mg  qd and Tamsulosin 0.4mg  qd. F/u Urology in one week

## 2016-07-29 NOTE — Assessment & Plan Note (Addendum)
Stable. Continue Trazodone 50mg  qhs.

## 2016-07-29 NOTE — Assessment & Plan Note (Signed)
Continue Fe, last Hgb 14.0 07/28/16, repeat CBC

## 2016-07-29 NOTE — Assessment & Plan Note (Signed)
For Afib, sub therapeutic, repeat PT/INR 08/01/16

## 2016-07-29 NOTE — Assessment & Plan Note (Signed)
Hospitalized 07/19/16 to 07/28/16, UTI, Citrobacter, completed ABT tx,  f/u Urology one week

## 2016-07-29 NOTE — Assessment & Plan Note (Signed)
Blood pressure is controlled, continue Amlodipine 2.5mg , Furosemide 40mg  qd, update BMP

## 2016-07-29 NOTE — Progress Notes (Signed)
Location:    [facility]ursing Home Room Number: 63 Place of Service:  SNF (31) Provider: Lennie Odor Mast NP  Estill Dooms, MD  Patient Care Team: Estill Dooms, MD as PCP - General (Internal Medicine) Carlena Bjornstad, MD (Cardiology) Earnie Larsson, MD (Neurosurgery) Rutherford Guys, MD (Ophthalmology) Renda Rolls, Jennefer Bravo, MD as Referring Physician (Dermatology) Alexis Frock, MD as Consulting Physician (Urology) Dorothy Spark, MD as Consulting Physician (Cardiology)  Extended Emergency Contact Information Primary Emergency Contact: [place], [place] Phone: [phone] Mobile Phone: [phone] Relation: Friend Secondary Emergency Contact: Ervin Knack of [place] Phone: [phone] Relation: Friend  Code Status:  DNR Goals of care: Advanced Directive information Advanced Directives 07/29/2016  Does Patient Have a Medical Advance Directive? Yes  Type of Advance Directive -  Does patient want to make changes to medical advance directive? No - Patient declined  Copy of [employer] in Chart? No - copy requested  Would patient like information on creating a medical advance directive? No - Patient declined  Pre-existing out of facility DNR order (yellow form or pink MOST form) -     Chief Complaint  Patient presents with  . Acute Visit    HPI:  Pt is a 83 y.o. male seen today for an acute visit for managing coumadin, switched from Lovenox to Coumadin 54m Hx of Afib, heart rate is in control, taking Amlodipine 2.575mqd.   Hospitalized 07/19/16 to 07/28/16, UTI, Citrobacter, completed ABT tx,  f/u Urology one week, s/p L2 kyphoplasty 07/25/16 for L2 compression fracture, will f/u interventional radiology in 2 weeks.   Hx of T2DM, taking Januvia 10061md, Hgb a1c 5.9 07/22/16, Hgb 10.8 07/07/16, taking Fe. Lower back pain, s/p Kyphoplasty, taking prn Tramadol, Zanaflex, Oxycodone, Tylenol,  Gabapentin 100m65md. Gout, maintained on Allopurinol 300mg35m Constipation is well managed on Colace 100mg 14m Senna I qhs, prn Miralax, BPH, no urinary retention, taking Myrbetriq, Finasteride 5mg qd57md Tamsulosin 0.4mg qd.92mN controlled on Furosemide 40mg qd 67mAmlodipine 2.5mg qd.  11m Past Medical History:  Diagnosis Date  . ACE-inhibitor cough   . [facility]  . Aortic stenosis    mild....echo... september... 2010/ mild... echo...Dec, 201[name] KitchenMarland Kitchen . Atrial fibrillation (HCC)     Chronic,   24 hour holter, September, 2011.... atrial fib rate is controlled.... there is some bradycardia but  no marked pauses  . [facility]  . DDD (degenerative disc disease)    lumbar spine  . Diabetes mellitus    type 2  . Diabetes mellitus with neuropathy (HCC) 10/3/[place]8   Qualifier: Diagnosis of  By: Norins MD,Linda Hedgesel E Heinz Knucklesons - DPP4   . Diverticulosis of colon with hemorrhage 01/01/2014  . Ejection fraction    EF 60%, echo, 01/2010  . Fluid overload   . Fracture of metacarpal bone 11/30/11  . GERD (gastroesophageal reflux disease)   . Gout   . History of echocardiogram    Echo 5/17 - mild LVH, EF 55-60%, mod AS (mean 16 mmHg, peak 29 mmHg), MAC, midl MR, massive BAE, mod dilated RV, low normal RVSF, mod TR, PASP 64 mmHg  . Hx of colonoscopy    approx. 10 years  . Hypercholesterolemia   . Hyperlipidemia   . Hypertension   .  Mitral regurgitation   . Normal nuclear stress test    06/2005 , also ABI normal 2007  . Prostate cancer ([place])    radiation tx.  . Pulmonary hypertension ([place])    61mHg, echo, 01/2010  . Shortness of breath    on exertion  . Warfarin anticoagulation    Atrial fib  . Whooping cough    Past Surgical History:  Procedure Laterality Date  . ASCENDING AORTIC ROOT REPLACEMENT N/A 04/15/2012   Procedure: ASCENDING AORTIC ROOT REPLACEMENT;  Surgeon: BGaye Pollack MD;   [place]  Service: Open Heart Surgery;  Laterality: N/A;  . bladder cancer biopsy  10/16/2010   negative for malignancy  . bladder microscopic  04/13/2007   high grade papillary urothelial lesions  . COLONOSCOPY N/A 01/01/2014   Procedure: COLONOSCOPY;  Surgeon: CGatha Mayer MD;  Location: [facility];  Service: Endoscopy;  Laterality: N/A;  . CYSTOSCOPY/RETROGRADE/URETEROSCOPY Bilateral 04/04/2013   Procedure: CYSTOSCOPY WITH RETROGRADE PYELOGRAM AND BLADDER BIOPSY;  Surgeon: DMolli Hazard MD;  Location: [facility];  Service: Urology;  Laterality: Bilateral;  . 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 EXPLORATION POST OPERATIVE OPEN HEART  04/2012  . Herniatic disc surgery    . IR KYPHO LUMBAR INC FX REDUCE BONE BX UNI/BIL CANNULATION INC/IMAGING  07/25/2016  . LUMBAR LAMINECTOMY     '02  . PROSTATE BIOPSY  09/25/2011   GLEASON 3+3=6 AND 4+3=7  . TEE WITHOUT CARDIOVERSION N/A 04/15/2012   Procedure: TRANSESOPHAGEAL ECHOCARDIOGRAM (TEE);  Surgeon: BGaye Pollack MD;  [place]  Service: Open Heart Surgery;  Laterality: N/A;  . TRANSURETHRAL RESECTION OF BLADDER TUMOR N/A 06/13/2015   Procedure: TRANSURETHRAL RESECTION OF PROSTATE (TURP) CLOT EVACUATION AND FULGERATION, BLADDER STONE REMOVAL ;  Surgeon: TAlexis Frock MD;  Location: [facility];  Service: Urology;  Laterality: N/A;  . TUR-BT '06, '09      Allergies  Allergen Reactions  . Metoprolol Other (See Comments)    Reaction:  Bradycardia with 228mBID dose    Allergies as of 07/29/2016      Reactions   Metoprolol Other (See Comments)   Reaction:  Bradycardia with 2548mID dose      Medication List       Accurate as of 07/29/16  4:26 PM. Always use your most recent med list.          acetaminophen 500 MG tablet Commonly known as:  TYLENOL Take 1,000 mg by mouth every 8 (eight) hours as needed for mild pain, moderate pain, fever or headache.   allopurinol 300 MG  tablet Commonly known as:  ZYLOPRIM take 1 tablet by mouth once daily   amLODipine 2.5 MG tablet Commonly known as:  NORVASC take 1 tablet by mouth once daily   atorvastatin 20 MG tablet Commonly known as:  LIPITOR take 1 tablet by mouth once daily   CALCIUM 600+D 600-400 MG-UNIT tablet Generic drug:  Calcium Carbonate-Vitamin D Take 1 tablet by mouth at bedtime.   desonide 0.05 % lotion Commonly known as:  DESOWEN Apply 1 application topically daily as needed (rosacea).   docusate sodium 100 MG capsule Commonly known as:  COLACE Take 1 capsule (100 mg total) by mouth every 12 (twelve) hours.   ferrous sulfate 325 (65 FE) MG tablet Take 650 mg by mouth daily with breakfast.   finasteride 5 MG tablet Commonly known as:  PROSCAR Take 1 tablet (5 mg total) by mouth  every morning.   furosemide 40 MG tablet Commonly known as:  LASIX Take 1 tablet (40 mg total) by mouth daily.   gabapentin 100 MG capsule Commonly known as:  NEURONTIN Take 1 capsule (100 mg total) by mouth 3 (three) times daily.   JANUVIA 100 MG tablet Generic drug:  sitaGLIPtin take 1 tablet by mouth once daily   ketoconazole 2 % shampoo Commonly known as:  NIZORAL Apply 1 application topically 4 (four) times a week.   MYRBETRIQ 50 MG Tb24 tablet Generic drug:  mirabegron ER Take 50 mg by mouth daily.   oxyCODONE 5 MG immediate release tablet Commonly known as:  ROXICODONE Take 1 tablet (5 mg total) by mouth every 6 (six) hours as needed for severe pain.   polyethylene glycol packet Commonly known as:  MIRALAX / GLYCOLAX Take 17 g by mouth daily as needed.   potassium chloride 10 MEQ tablet Commonly known as:  K-DUR,KLOR-CON take 1 tablet by mouth once daily   senna 8.6 MG tablet Commonly known as:  SENOKOT Take 1 tablet by mouth at bedtime.   tamsulosin 0.4 MG Caps capsule Commonly known as:  FLOMAX Take 0.4 mg by mouth at bedtime.   tiZANidine 4 MG tablet Commonly known as:   ZANAFLEX Take 1 tablet (4 mg total) by mouth every 6 (six) hours as needed for muscle spasms.   traMADol 50 MG tablet Commonly known as:  ULTRAM Take 1 tablet (50 mg total) by mouth every 6 (six) hours as needed for moderate pain.   traZODone 50 MG tablet Commonly known as:  DESYREL Take 1 tablet (50 mg total) by mouth See admin instructions.   warfarin 5 MG tablet Commonly known as:  COUMADIN Take 1 tablet (5 mg total) by mouth daily at 6 PM.       Review of Systems  Constitutional: Positive for activity change and fatigue. Negative for appetite change, chills, fever and unexpected weight change.  HENT: Positive for hearing loss. Negative for congestion, dental problem, drooling, facial swelling, mouth sores and nosebleeds.   Eyes: Negative for pain, discharge, redness, itching and visual disturbance.  Respiratory: Negative for cough, choking, chest tightness, shortness of breath and wheezing.   Cardiovascular: Positive for leg swelling. Negative for chest pain.       Trace  Gastrointestinal: Negative for abdominal distention, abdominal pain, anal bleeding, blood in stool, constipation, diarrhea, nausea and vomiting.  Endocrine: Negative for polydipsia, polyphagia and polyuria.  Genitourinary: Negative for difficulty urinating, dysuria, frequency and hematuria.  Musculoskeletal: Positive for back pain and gait problem. Negative for joint swelling, myalgias, neck pain and neck stiffness.  Skin: Negative for color change, pallor and rash.       Brown pigmented BLE from knee down  Allergic/Immunologic: Negative for immunocompromised state.  Neurological: Negative for dizziness, tremors, syncope, facial asymmetry, speech difficulty, weakness, light-headedness, numbness and headaches.  Hematological: Negative for adenopathy. Does not bruise/bleed easily.  Psychiatric/Behavioral: Positive for sleep disturbance. Negative for agitation, confusion and decreased concentration. The patient is  not nervous/anxious.     Immunization History  Administered Date(s) Administered  . H1N1 03/07/2008  . Influenza Split 12/04/2010, 11/10/2011  . Influenza Whole 12/06/2007, 11/29/2008, 11/12/2009  . Influenza, High Dose Seasonal PF 12/02/2012  . Influenza,inj,Quad PF,36+ Mos 10/26/2013  . Influenza-Unspecified 11/10/2014, 12/04/2015  . Pneumococcal Conjugate-13 04/11/2013  . Pneumococcal Polysaccharide-23 06/03/2006  . Td 03/07/2008  . Zoster 11/14/2009   Pertinent  Health Maintenance Due  Topic Date Due  . OPHTHALMOLOGY  EXAM  11/30/2015  . URINE MICROALBUMIN  04/09/2016  . INFLUENZA VACCINE  10/01/2016  . FOOT EXAM  01/14/2017  . HEMOGLOBIN A1C  01/22/2017  . PNA vac Low Risk Adult  Completed   Fall Risk  07/07/2016 01/15/2016 01/04/2016 10/19/2015 04/10/2015  Falls in the past year? No Yes Yes No No  Number falls in past yr: - 1 1 - -  Injury with Fall? - No No - -  Risk for fall due to : - Impaired balance/gait;Impaired mobility - - -  Follow up - Falls prevention discussed - - -   Functional Status Survey:    Vitals:   07/29/16 1208  BP: 125/90  Pulse: 68  Resp: 16  Temp: (!) 96.9 F (36.1 C)  Weight: 196 lb 6.4 oz (89.1 kg)  Height: 5' 8"  (1.727 m)   Body mass index is 29.86 kg/m. Physical Exam  Constitutional: He is oriented to person, place, and time. He appears well-developed. No distress.  HENT:  Head: Normocephalic.  Mouth/Throat: Oropharynx is clear and moist.  Neck: Normal range of motion. No JVD present. No tracheal deviation present.  Cardiovascular: Normal rate and normal heart sounds.   Irregular rhythm, Systolic murmur 9-5/1  Pulmonary/Chest: Effort normal and breath sounds normal. No respiratory distress.  Abdominal: Soft. There is no tenderness. There is no guarding.  Musculoskeletal: He exhibits edema and tenderness.  Tender mid lumbar region. Trace edema in ankles.   Neurological: He is alert and oriented to person, place, and time. He  displays normal reflexes. No cranial nerve deficit. He exhibits normal muscle tone.  Skin: Skin is warm and dry.  Brown pigmented changes BLE from knee down. L2 kyphoplasty site intact.   Psychiatric: He has a normal mood and affect.    Labs reviewed:  Recent Labs  07/22/16 0531  07/26/16 0537 07/27/16 0511 07/28/16 0520  NA 137  < > 139 138 140  K 3.6  < > 3.5 3.8 3.7  CL 99*  < > 100* 99* 99*  CO2 30  < > 28 30 30   GLUCOSE 126*  < > 112* 114* 117*  BUN 15  < > 22* 24* 24*  CREATININE 0.93  < > 0.77 1.01 0.97  CALCIUM 9.2  < > 9.7 9.5 9.7  MG 1.7  --  1.7  --  1.7  < > = values in this interval not displayed.  Recent Labs  04/07/16 1311 07/07/16 1725 07/10/16 1546  AST 21 25 38  ALT 13 16 27   ALKPHOS 96 80 81  BILITOT 1.49* 0.9 1.8*  PROT 8.0 7.2 7.9  ALBUMIN 4.2 4.4 4.4    Recent Labs  07/10/16 1546  07/20/16 0505  07/25/16 0527 07/26/16 0537 07/27/16 0511 07/28/16 0520  WBC 15.9*  < > 12.2*  < > 11.8* 9.9 10.0 9.1  NEUTROABS 13.6*  --  9.7*  --  10.4*  --   --   --   HGB 14.3  < > 13.2  < > 12.7* 13.0 14.0 14.0  HCT 41.7  < > 40.6  < > 38.6* 39.6 44.1 42.5  MCV 92.3  < > 95.3  < > 94.8 94.3 95.2 95.3  PLT 252  < > 197  < > 216 207 225 227  < > = values in this interval not displayed. Lab Results  Component Value Date   TSH 1.48 04/10/2015   Lab Results  Component Value Date   HGBA1C 5.9 (H) 07/22/2016  Lab Results  Component Value Date   CHOL 102 04/10/2015   HDL 45.60 04/10/2015   LDLCALC 45 04/10/2015   TRIG 57.0 04/10/2015   CHOLHDL 2 04/10/2015    Significant Diagnostic Results in last 30 days:  Ct Abdomen Pelvis Wo Contrast  Result Date: 07/10/2016 CLINICAL DATA:  Low back and bilateral hip pain.  Recent fall. EXAM: CT ABDOMEN AND PELVIS WITHOUT CONTRAST TECHNIQUE: Multidetector CT imaging of the abdomen and pelvis was performed following the standard protocol without IV contrast. COMPARISON:  06/11/2015 FINDINGS: Lower chest:   Subsegmental atelectasis in the lower lobes. Hepatobiliary: Nodular liver contour suggests cirrhosis. No focal abnormality in the liver on this study without intravenous contrast. Multiple calcified gallstones identified measuring around 5 mm each. No intrahepatic or extrahepatic biliary dilation. Pancreas: No focal mass lesion. No dilatation of the main duct. No intraparenchymal cyst. No peripancreatic edema. Spleen: No splenomegaly.  Tiny subcapsular cyst again noted. Adrenals/Urinary Tract: No adrenal nodule or mass. 19 mm cystic lesion upper pole right kidney incompletely characterized on today's study but similar in appearance to 06/11/2015. Left kidney unremarkable. No evidence for hydroureter. Dystrophic calcification is identified in the posterior bladder wall extending to the central prostate gland, potentially related to prior TURBT. Stomach/Bowel: Stomach is nondistended. No gastric wall thickening. No evidence of outlet obstruction. Duodenum is normally positioned as is the ligament of Treitz. No small bowel wall thickening. No small bowel dilatation. The terminal ileum is normal. The appendix is normal. No gross colonic mass. No colonic wall thickening. No substantial diverticular change. Vascular/Lymphatic: Chronic thoracoabdominal aortic dissection again noted, incompletely visualized. There is no gastrohepatic or hepatoduodenal ligament lymphadenopathy. No intraperitoneal or retroperitoneal lymphadenopathy. No pelvic sidewall lymphadenopathy. Reproductive: Brachytherapy seeds noted prostate gland. Other: No intraperitoneal free fluid. Musculoskeletal: Small groin hernias contain only fat. Bone windows reveal no worrisome lytic or sclerotic osseous lesions. Compression deformity at the L2 level is age indeterminate on today's study but new since 06/11/2015. IMPRESSION: 1. No acute findings in the abdomen or pelvis. 2. Interval development of a large dystrophic calcification associated with the  posterior bladder wall and central prostate, potentially related to the urethra. 3. Chronic thoracoabdominal aortic dissection. 4. Age-indeterminate L2 compression deformity is new since 06/11/2015. 5. Cholelithiasis. 6. Nodular liver contour suggests cirrhosis. Electronically Signed   By: Misty Stanley M.D.   On: 07/10/2016 16:39   Dg Lumbar Spine Complete  Result Date: 07/19/2016 CLINICAL DATA:  Severe low back pain chronically. EXAM: LUMBAR SPINE - COMPLETE 4+ VIEW COMPARISON:  CT 07/10/2016 and 06/11/2015 FINDINGS: Diffuse osteopenia. Mild spondylosis throughout the lumbar spine. Subtle grade 1 anterolisthesis of L4 on L5 likely due to the moderate facet arthropathy. Disc space narrowing at the L4-5 level. Moderate L2 compression fracture unchanged from recent CT and new compared to 06/11/2015. Calcified plaque over the abdominal aorta. IMPRESSION: Moderate L2 compression fracture age indeterminate, unchanged from the recent CT and new since 06/11/2015. Mild spondylosis of the lumbar spine with disc disease at the L4-5 level. Grade 1 anterolisthesis of L4 on L5 due to the moderate facet arthropathy. Electronically Signed   By: Marin Olp M.D.   On: 07/19/2016 21:04   Mr Thoracic Spine Wo Contrast  Result Date: 07/14/2016 CLINICAL DATA:  Progressive thoracic and lumbar pain. EXAM: MRI THORACIC AND LUMBAR SPINE WITHOUT CONTRAST TECHNIQUE: Multiplanar and multiecho pulse sequences of the thoracic and lumbar spine were obtained without intravenous contrast. COMPARISON:  CT scan of the abdomen and pelvis dated 07/10/2016 and CT  scan of the chest dated 11/21/2015 FINDINGS: MRI THORACIC SPINE FINDINGS Alignment:  Physiologic. Vertebrae: No fracture, evidence of discitis, or bone lesion. Cord:  Normal signal and morphology. Paraspinal and other soft tissues: Aneurysmal dilatation of the thoracic aorta including the ascending and descending portions with a chronic type B dissection of the descending thoracic  aorta extending into the abdominal aorta. Disc levels: Diffuse desiccation of the discs. No significant disc bulging. No disc protrusions or neural impingement. No foraminal or spinal stenosis or significant facet joint disease. MRI LUMBAR SPINE FINDINGS Segmentation:  Standard. Alignment:  Physiologic. Vertebrae: Benign-appearing mild compression fracture of L2. Minimal protrusion of the posterior margin of L2 into the spinal canal without neural impingement. Conus medullaris: Extends to the L1-2 level and appears normal. Paraspinal and other soft tissues: Chronic dissection of the abdominal aorta extending into the left common iliac artery, appearing unchanged since the prior CT scan. Cholelithiasis. Disc levels: L1-2:  Tiny broad-based disc bulge with no neural impingement. L2-3:  Tiny disc bulge into the left neural foramen. L3-4: Tiny broad-based disc bulge asymmetric to the left without neural impingement. L4-5: Disc space narrowing. Small broad-based disc osteophyte complex without neural impingement, extending into both neural foramina. This could affect the right L4 nerve. L5-S1: Tiny disc bulge asymmetric to the left with no neural impingement. Small Tarlov cyst at S2. IMPRESSION: MR THORACIC SPINE IMPRESSION No significant abnormalities of the thoracic spine. Chronic thoracolumbar aortic dissection and aneurysmal dilatation of the ascending thoracic aorta. MR LUMBAR SPINE IMPRESSION Benign appearing acute or subacute compression fracture of L2 without neural impingement. Degenerative disc disease primarily at L4-5 with moderate right foraminal stenosis which could affect the L4 nerve. Electronically Signed   By: Lorriane Shire M.D.   On: 07/14/2016 08:51   Mr Lumbar Spine Wo Contrast  Result Date: 07/14/2016 CLINICAL DATA:  Progressive thoracic and lumbar pain. EXAM: MRI THORACIC AND LUMBAR SPINE WITHOUT CONTRAST TECHNIQUE: Multiplanar and multiecho pulse sequences of the thoracic and lumbar spine  were obtained without intravenous contrast. COMPARISON:  CT scan of the abdomen and pelvis dated 07/10/2016 and CT scan of the chest dated 11/21/2015 FINDINGS: MRI THORACIC SPINE FINDINGS Alignment:  Physiologic. Vertebrae: No fracture, evidence of discitis, or bone lesion. Cord:  Normal signal and morphology. Paraspinal and other soft tissues: Aneurysmal dilatation of the thoracic aorta including the ascending and descending portions with a chronic type B dissection of the descending thoracic aorta extending into the abdominal aorta. Disc levels: Diffuse desiccation of the discs. No significant disc bulging. No disc protrusions or neural impingement. No foraminal or spinal stenosis or significant facet joint disease. MRI LUMBAR SPINE FINDINGS Segmentation:  Standard. Alignment:  Physiologic. Vertebrae: Benign-appearing mild compression fracture of L2. Minimal protrusion of the posterior margin of L2 into the spinal canal without neural impingement. Conus medullaris: Extends to the L1-2 level and appears normal. Paraspinal and other soft tissues: Chronic dissection of the abdominal aorta extending into the left common iliac artery, appearing unchanged since the prior CT scan. Cholelithiasis. Disc levels: L1-2:  Tiny broad-based disc bulge with no neural impingement. L2-3:  Tiny disc bulge into the left neural foramen. L3-4: Tiny broad-based disc bulge asymmetric to the left without neural impingement. L4-5: Disc space narrowing. Small broad-based disc osteophyte complex without neural impingement, extending into both neural foramina. This could affect the right L4 nerve. L5-S1: Tiny disc bulge asymmetric to the left with no neural impingement. Small Tarlov cyst at S2. IMPRESSION: MR THORACIC SPINE IMPRESSION No  significant abnormalities of the thoracic spine. Chronic thoracolumbar aortic dissection and aneurysmal dilatation of the ascending thoracic aorta. MR LUMBAR SPINE IMPRESSION Benign appearing acute or  subacute compression fracture of L2 without neural impingement. Degenerative disc disease primarily at L4-5 with moderate right foraminal stenosis which could affect the L4 nerve. Electronically Signed   By: Lorriane Shire M.D.   On: 07/14/2016 08:51   Ir Kypho Lumbar Inc Fx Reduce Bone Bx Uni/bil Cannulation Inc/imaging  Result Date: 07/25/2016 CLINICAL DATA:  Symptomatic L2 compression fracture. EXAM: FLUOROSCOPIC GUIDED KYPHOPLASTY OF THE L2 VERTEBRAL BODY COMPARISON:  CT abdomen pelvis - 07/19/2016; 07/10/2016; lumbar spine MRI - 07/14/2016 MEDICATIONS: The patient is currently admitted to the hospital and receiving intravenous antibiotics. Antibiotics were administered with an appropriate time frame prior to this skin puncture. ANESTHESIA/SEDATION: Moderate (conscious) sedation was employed during this procedure. A total of Versed 6.5 mg and Fentanyl 150 mcg was administered intravenously. Moderate Sedation Time: 57 minutes. The patient's level of consciousness and vital signs were monitored continuously by radiology nursing throughout the procedure under my direct supervision. FLUOROSCOPY TIME:  14 min, 6 seconds (0240 mGy) COMPLICATIONS: None immediate. TECHNIQUE: The procedure, risks (including but not limited to bleeding, infection, organ damage), benefits, and alternatives were explained to the patient. Questions regarding the procedure were encouraged and answered. The patient understands and consents to the procedure. The patient was placed prone on the fluoroscopic table. The skin overlying the upper thoracic region was then prepped and draped in the usual sterile fashion. Maximal barrier sterile technique was utilized including caps, mask, sterile gowns, sterile gloves, sterile drape, hand hygiene and skin antiseptic. Intravenous Fentanyl and Versed were administered as conscious sedation during continuous cardiorespiratory monitoring by the radiology RN. The left pedicle at L2 was then infiltrated  with 1% lidocaine followed by the advancement of a Kyphon trocar needle through the left pedicle into the posterior one-third of the vertebral body. Subsequently, the osteo drill was advanced to the anterior third of the vertebral body. The osteo drill was retracted. Through the working cannula, a Kyphon inflatable bone tamp 15 x 3 was advanced and positioned with the distal marker approximately 5 mm from the anterior aspect of the cortex. Appropriate positioning was confirmed on the AP projection. At this time, the balloon was expanded using contrast via a Kyphon inflation syringe device via micro tubing. In similar fashion, the right L2 pedicle was infiltrated with 1% lidocaine followed by the advancement of a second Kyphon trocar needle through the right pedicle into the posterior third of the vertebral body. Subsequently, the osteo drill was coaxially advanced to the anterior right third. The osteo drill was exchanged for a Kyphon inflatable bone tamp 15 x 3, advanced to the 5 mm of the anterior aspect of the cortex. The balloon was then expanded using contrast as above. Inflations were continued until there was near apposition with the superior end plate. At this time, methylmethacrylate mixture was reconstituted in the Kyphon bone mixing device system. This was then loaded into the delivery mechanism, attached to Kyphon bone fillers. The balloons were deflated and removed followed by the instillation of methylmethacrylate mixture with excellent filling in the AP and lateral projections. No extravasation was noted in the disk spaces or posteriorly into the spinal canal. No epidural venous contamination was seen. The working cannulae and the bone filler were then retrieved and removed. Hemostasis was achieved with manual compression. The patient tolerated the procedure well without immediate postprocedural complication. IMPRESSION: 1. Technically  successful L2 vertebral body augmentation using balloon  kyphoplasty. 2. Per CMS PQRS reporting requirements (PQRS Measure 24): Given the patient's age of greater than 80 and the fracture site (hip, distal radius, or spine), the patient should be tested for osteoporosis using DXA, and the appropriate treatment considered based on the DXA results. Electronically Signed   By: Sandi Mariscal M.D.   On: 07/25/2016 16:54   Ct Renal Stone Study  Result Date: 07/19/2016 CLINICAL DATA:  Flank pain, possible UTI EXAM: CT ABDOMEN AND PELVIS WITHOUT CONTRAST TECHNIQUE: Multidetector CT imaging of the abdomen and pelvis was performed following the standard protocol without IV contrast. COMPARISON:  07/10/2016 FINDINGS: Lower chest: Small right pleural effusion. Hepatobiliary: Liver is grossly unremarkable. Layering small gallstones (series 2/ image 11). No associated inflammatory changes. Pancreas: Within normal limits. Spleen: Within normal limits. Adrenals/Urinary Tract: Adrenal glands are within normal limits. Kidneys are within normal limits. No renal or ureteral calculi. No hydronephrosis. Bladder is underdistended. Stable high density material/ calcification along the posterior aspect of the bladder (series 2/ image 66) extending into a prostate TURP defect (series 2/image 71). Stomach/Bowel: Stomach is within normal limits. No evidence of bowel obstruction. Normal appendix (series 2/ image 54). Vascular/Lymphatic: Known thoracoabdominal aortic dissection is poorly evaluated on unenhanced CT, although a displaced intimal calcification is present. 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 Atherosclerotic calcifications of the abdominal aorta and branch vessels. No suspicious abdominopelvic lymphadenopathy. Reproductive: Brachytherapy seeds in the prostate. Postsurgical changes related to TURP defect with calcifications, as described above. Other: No abdominopelvic ascites. Tiny fat containing periumbilical hernia.  Musculoskeletal: Moderate compression fracture deformity at L2 (sagittal image 108), with 50% loss of height, mildly progressed from recent CT. Minimal retropulsion of 1-2 mm. IMPRESSION: Moderate compression fracture deformity at L2, with 50% loss of height, mildly progressed from recent CT. Minimal retropulsion of 1-2 mm. Postsurgical changes related to TURP. Dystrophic calcification along the posterior bladder and extending into the TURP defect. Cholelithiasis, without associated inflammatory changes. Small right pleural effusion. 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 No findings suspicious for metastatic disease. Electronically Signed   By: Julian Hy M.D.   On: 07/19/2016 22:31    Assessment/Plan Warfarin-induced coagulopathy ([facility]) INR< 2, continue Coumadin 30m, repeat PT/INR 08/01/16  Acute UTI Fully treated, f/u Urology in one week.   AKI (acute kidney injury) ([facility] Update CBC BMP   Chronic atrial fibrillation (HCC) Heart rate is in control, continue Amlodipine 2.59m Coumadin per PT/INR, INR goal is 2-3.   Hypertension Blood pressure is controlled, continue Amlodipine 2.69m43mFurosemide 51m79m, update BMP  Chronic diastolic CHF (congestive heart failure) (HCC)[place].   GERD (gastroesophageal reflux disease) Stable, continue Omeprazole 20mg51m Diabetes mellitus with neuropathy (HCC) Blood sugar is controlled, taking Januvia 100mg 19mHgb a1c 5.9 07/22/16,  Radiculitis Lower back pain, s/p Kyphoplasty, taking prn Tramadol, Zanaflex, Oxycodone, Tylenol, Gabapentin 100mg t28m 07/25/16 L2 kyphoplasty for L2 compression fx. F/u interventional radiology 2 weeks  HYPERTROPHY PROSTATE W/O UR OBST & OTH LUTS no urinary retention, taking Myrbetriq, Finasteride 69mg qd 47m Tamsulosin 0.4mg qd. 46m Urology in one week  UTI (urinary tract infection) Hospitalized 07/19/16 to 07/28/16, UTI, Citrobacter, completed ABT tx,  f/u Urology one week  Gout Stable, continue  Allopurinol 300mg qd. 63mrfarin anticoagulation For Afib, sub therapeutic, repeat PT/INR 08/01/16  Insomnia Stable. Continue Trazodone 50mg qhs. 101mronic anemia Continue Fe, last Hgb 14.0 07/28/16, repeat  CBC  Peripheral edema Managed with Furosemide 67m qd, repeat BMP   Gait disorder PT to eval and tx, [place] when able.   Constipation Stable, continue  Colace 1038mbid, Senna I qhs, prn Miralax,      Family/ staff Communication: [place] when able.   Labs/tests ordered: CBC BMP PT/INR

## 2016-07-29 NOTE — Assessment & Plan Note (Signed)
Fully treated, f/u Urology in one week.

## 2016-07-29 NOTE — Assessment & Plan Note (Signed)
Blood sugar is controlled, taking Januvia 100mg  qd, Hgb a1c 5.9 07/22/16,

## 2016-07-29 NOTE — Assessment & Plan Note (Signed)
Compensated clinically.  

## 2016-07-29 NOTE — Assessment & Plan Note (Signed)
Stable, continue Omeprazole 20mg qd.  

## 2016-07-29 NOTE — Assessment & Plan Note (Signed)
INR< 2, continue Coumadin 5mg , repeat PT/INR 08/01/16

## 2016-07-29 NOTE — Assessment & Plan Note (Signed)
Managed with Furosemide 40mg  qd, repeat BMP

## 2016-07-29 NOTE — Assessment & Plan Note (Signed)
Lower back pain, s/p Kyphoplasty, taking prn Tramadol, Zanaflex, Oxycodone, Tylenol, Gabapentin 100mg  tid.  07/25/16 L2 kyphoplasty for L2 compression fx. F/u interventional radiology 2 weeks

## 2016-07-29 NOTE — Telephone Encounter (Signed)
Pt was on TCM list admitted 5/19 for UTI and Low back Pain he was recently diagnosed with L2 compression fracture. Pt was D/C 5/28, back to the nursing home, and will f/u w/nursing provider to  obtain BMP/CBC in one week. Follow-up INR at earliest convenience. Coumadin to be dosed as per INR.  He will also follow-up with Dr. Manny/urologist in 1  Week...Johny Chess

## 2016-07-29 NOTE — Assessment & Plan Note (Signed)
Stable, continue Allopurinol 300mg qd 

## 2016-07-29 NOTE — Assessment & Plan Note (Signed)
Stable, continue  Colace 100mg  bid, Senna I qhs, prn Miralax,

## 2016-07-29 NOTE — Assessment & Plan Note (Signed)
PT to eval and tx, [place] when able.

## 2016-07-30 ENCOUNTER — Telehealth: Payer: Self-pay

## 2016-07-30 ENCOUNTER — Non-Acute Institutional Stay (SKILLED_NURSING_FACILITY): Payer: Medicare Other | Admitting: Internal Medicine

## 2016-07-30 ENCOUNTER — Encounter: Payer: Self-pay | Admitting: Internal Medicine

## 2016-07-30 DIAGNOSIS — S32020D Wedge compression fracture of second lumbar vertebra, subsequent encounter for fracture with routine healing: Secondary | ICD-10-CM | POA: Diagnosis not present

## 2016-07-30 DIAGNOSIS — M5440 Lumbago with sciatica, unspecified side: Secondary | ICD-10-CM

## 2016-07-30 DIAGNOSIS — C61 Malignant neoplasm of prostate: Secondary | ICD-10-CM

## 2016-07-30 DIAGNOSIS — I5032 Chronic diastolic (congestive) heart failure: Secondary | ICD-10-CM

## 2016-07-30 DIAGNOSIS — M545 Low back pain, unspecified: Secondary | ICD-10-CM

## 2016-07-30 DIAGNOSIS — M109 Gout, unspecified: Secondary | ICD-10-CM

## 2016-07-30 DIAGNOSIS — D6832 Hemorrhagic disorder due to extrinsic circulating anticoagulants: Secondary | ICD-10-CM | POA: Diagnosis not present

## 2016-07-30 DIAGNOSIS — E1140 Type 2 diabetes mellitus with diabetic neuropathy, unspecified: Secondary | ICD-10-CM

## 2016-07-30 DIAGNOSIS — T45515A Adverse effect of anticoagulants, initial encounter: Secondary | ICD-10-CM

## 2016-07-30 DIAGNOSIS — N3000 Acute cystitis without hematuria: Secondary | ICD-10-CM

## 2016-07-30 DIAGNOSIS — E785 Hyperlipidemia, unspecified: Secondary | ICD-10-CM

## 2016-07-30 DIAGNOSIS — I10 Essential (primary) hypertension: Secondary | ICD-10-CM | POA: Diagnosis not present

## 2016-07-30 DIAGNOSIS — I482 Chronic atrial fibrillation, unspecified: Secondary | ICD-10-CM

## 2016-07-30 DIAGNOSIS — G8929 Other chronic pain: Secondary | ICD-10-CM

## 2016-07-30 DIAGNOSIS — A498 Other bacterial infections of unspecified site: Secondary | ICD-10-CM

## 2016-07-30 DIAGNOSIS — N400 Enlarged prostate without lower urinary tract symptoms: Secondary | ICD-10-CM | POA: Diagnosis not present

## 2016-07-30 NOTE — Progress Notes (Signed)
: Provider:  Noah Delaine. Sheppard Coil, MD Location:  [place] Room Number: 32 Place of Service:  SNF ([phone])  PCP: Estill Dooms, MD Patient Care Team: Estill Dooms, MD as PCP - General (Internal Medicine) Carlena Bjornstad, MD (Cardiology) Earnie Larsson, MD (Neurosurgery) Rutherford Guys, MD (Ophthalmology) Renda Rolls, Jennefer Bravo, MD as Referring Physician (Dermatology) Alexis Frock, MD as Consulting Physician (Urology) Dorothy Spark, MD as Consulting Physician (Cardiology)  Extended Emergency Contact Information Primary Emergency Contact: [place], [place] of [place] Phone: [phone] Mobile Phone: [phone] Relation: Friend Secondary Emergency Contact: Ervin Knack of [place] Phone: [phone] Relation: Friend     Allergies: Metoprolol  Chief Complaint  Patient presents with  . New Admit To SNF    Admit to Facility    HPI: Patient is 83 y.o. male with DM-2, HTN, Afib on coumadin, recently diagnosed with L2 compression fracture-presented to the ED for evaluation of intractable back pain.He recently was seen in the ED on and 5/10 and 5/14 for similar problems. He had a mechanical fall earlier this month, with gradual worsening of his low back pain. A MRI LS Spine on 5/14 confirmed a L2 compression fracture, he was started on oral narcotics and other supportive measures, but continued to have pain. He presented back to the ED on 5/19-continued to have pain inspite of narcotics, benzo's, and was also found to have a UTI and coagulopathy Pt was admitted to [facility] from 5/19-28. Patient was started on for spectrum antibiotics. Urine culture grew Citrobacter resistant to Rocephin; antibiotic was switched to cefepime on 07/22/2016. Patient is currently on heparin drip. INR is improved. The patient had kyphoplasty done by interventional radiology on 07/25/2016. His pain is improving. Antibiotics were  switched back to ciprofloxacin, and dosing is finished.  Coumadin has already been started. Pt is d/c to SNF ofr OT.PT. Whie at SNF pt will be followed for AF, tx ith coumadin and amiodarone, chronic dCHF, tx with Lasi and HTN tx with lasix.  Past Medical History:  Diagnosis Date  . ACE-inhibitor cough   . Aortic dissection [facility])    Surgical repair February, 2014  . Aortic stenosis    mild....echo... september... 2010/ mild... echo.[name] KitchenMarland KitchenDec, 2011  . Atrial fibrillation (HCC)     Chronic,   24 hour holter, September, 2011.... atrial fib rate is controlled.... there is some bradycardia but  no marked pauses  . Bladder cancer [facility])    recurrence with TUR-B March '09  . DDD (degenerative disc disease)    lumbar spine  . Diabetes mellitus    type 2  . Diabetes mellitus with neuropathy ([place]) 12/04/2006   Qualifier: Diagnosis of  By: Linda Hedges MD, Heinz Knuckles  medications - DPP4   . Diverticulosis of colon with hemorrhage 01/01/2014  . Ejection fraction    EF 60%, echo, 01/2010  . Fluid overload   . Fracture of metacarpal bone 11/30/11  . GERD (gastroesophageal reflux disease)   . Gout   . History of echocardiogram    Echo 5/17 - mild LVH, EF 55-60%, mod AS (mean 16 mmHg, peak 29 mmHg), MAC, midl MR, massive BAE, mod dilated RV, low normal RVSF, mod TR, PASP 64 mmHg  . Hx of colonoscopy    approx. 10 years  . Hypercholesterolemia   . Hyperlipidemia   . Hypertension   . Mitral regurgitation   . Normal nuclear stress test  06/2005 , also ABI normal 2007  . Prostate cancer ([place])    radiation tx.  . Pulmonary hypertension ([place])    65mHg, echo, 01/2010  . Shortness of breath    on exertion  . Warfarin anticoagulation    Atrial fib  . Whooping cough     Past Surgical History:  Procedure Laterality Date  . ASCENDING AORTIC ROOT REPLACEMENT N/A 04/15/2012   Procedure: ASCENDING AORTIC ROOT REPLACEMENT;  Surgeon: BGaye Pollack MD;  [place]  Service: Open Heart Surgery;  Laterality:  N/A;  . bladder cancer biopsy  10/16/2010   negative for malignancy  . bladder microscopic  04/13/2007   high grade papillary urothelial lesions  . COLONOSCOPY N/A 01/01/2014   Procedure: COLONOSCOPY;  Surgeon: CGatha Mayer MD;  Location: [facility];  Service: Endoscopy;  Laterality: N/A;  . CYSTOSCOPY/RETROGRADE/URETEROSCOPY Bilateral 04/04/2013   Procedure: CYSTOSCOPY WITH RETROGRADE PYELOGRAM AND BLADDER BIOPSY;  Surgeon: DMolli Hazard MD;  Location: [facility];  Service: Urology;  Laterality: Bilateral;  . 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 EXPLORATION POST OPERATIVE OPEN HEART  04/2012  . Herniatic disc surgery    . IR KYPHO LUMBAR INC FX REDUCE BONE BX UNI/BIL CANNULATION INC/IMAGING  07/25/2016  . LUMBAR LAMINECTOMY     '02  . PROSTATE BIOPSY  09/25/2011   GLEASON 3+3=6 AND 4+3=7  . TEE WITHOUT CARDIOVERSION N/A 04/15/2012   Procedure: TRANSESOPHAGEAL ECHOCARDIOGRAM (TEE);  Surgeon: BGaye Pollack MD;  [place]  Service: Open Heart Surgery;  Laterality: N/A;  . TRANSURETHRAL RESECTION OF BLADDER TUMOR N/A 06/13/2015   Procedure: TRANSURETHRAL RESECTION OF PROSTATE (TURP) CLOT EVACUATION AND FULGERATION, BLADDER STONE REMOVAL ;  Surgeon: TAlexis Frock MD;  Location: [facility];  Service: Urology;  Laterality: N/A;  . TUR-BT '06, '09      Allergies as of 07/30/2016      Reactions   Metoprolol Other (See Comments)   Reaction:  Bradycardia with 277mBID dose      Medication List       Accurate as of 07/30/16 12:55 PM. Always use your most recent med list.          acetaminophen 500 MG tablet Commonly known as:  TYLENOL Take 1,000 mg by mouth every 8 (eight) hours as needed for mild pain, moderate pain, fever or headache.   allopurinol 300 MG tablet Commonly known as:  ZYLOPRIM take 1 tablet by mouth once daily   amLODipine 2.5 MG tablet Commonly known as:  NORVASC take 1 tablet by mouth once daily   atorvastatin 20 MG  tablet Commonly known as:  LIPITOR take 1 tablet by mouth once daily   CALCIUM 600+D 600-400 MG-UNIT tablet Generic drug:  Calcium Carbonate-Vitamin D Take 1 tablet by mouth at bedtime.   desonide 0.05 % lotion Commonly known as:  DESOWEN Apply 1 application topically daily as needed (rosacea).   docusate sodium 100 MG capsule Commonly known as:  COLACE Take 1 capsule (100 mg total) by mouth every 12 (twelve) hours.   ferrous sulfate 325 (65 FE) MG tablet Take 650 mg by mouth daily with breakfast.   finasteride 5 MG tablet Commonly known as:  PROSCAR Take 1 tablet (5 mg total) by mouth every morning.   furosemide 40 MG tablet Commonly known as:  LASIX Take 1 tablet (40 mg total) by mouth daily.   gabapentin 100 MG capsule Commonly known as:  NEURONTIN Take 1  capsule (100 mg total) by mouth 3 (three) times daily.   JANUVIA 100 MG tablet Generic drug:  sitaGLIPtin take 1 tablet by mouth once daily   ketoconazole 2 % shampoo Commonly known as:  NIZORAL Apply 1 application topically 4 (four) times a week.   MYRBETRIQ 50 MG Tb24 tablet Generic drug:  mirabegron ER Take 50 mg by mouth daily.   oxyCODONE 5 MG immediate release tablet Commonly known as:  ROXICODONE Take 1 tablet (5 mg total) by mouth every 6 (six) hours as needed for severe pain.   polyethylene glycol packet Commonly known as:  MIRALAX / GLYCOLAX Take 17 g by mouth daily as needed.   potassium chloride 10 MEQ tablet Commonly known as:  K-DUR,KLOR-CON take 1 tablet by mouth once daily   senna 8.6 MG tablet Commonly known as:  SENOKOT Take 1 tablet by mouth at bedtime.   tamsulosin 0.4 MG Caps capsule Commonly known as:  FLOMAX Take 0.4 mg by mouth at bedtime.   tiZANidine 4 MG tablet Commonly known as:  ZANAFLEX Take 1 tablet (4 mg total) by mouth every 6 (six) hours as needed for muscle spasms.   traMADol 50 MG tablet Commonly known as:  ULTRAM Take 1 tablet (50 mg total) by mouth every 6  (six) hours as needed for moderate pain.   traZODone 50 MG tablet Commonly known as:  DESYREL Take 1 tablet (50 mg total) by mouth See admin instructions.   warfarin 5 MG tablet Commonly known as:  COUMADIN Take 1 tablet (5 mg total) by mouth daily at 6 PM.       No orders of the defined types were placed in this encounter.   Immunization History  Administered Date(s) Administered  . H1N1 03/07/2008  . Influenza Split 12/04/2010, 11/10/2011  . Influenza Whole 12/06/2007, 11/29/2008, 11/12/2009  . Influenza, High Dose Seasonal PF 12/02/2012  . Influenza,inj,Quad PF,36+ Mos 10/26/2013  . Influenza-Unspecified 11/10/2014, 12/04/2015  . Pneumococcal Conjugate-13 04/11/2013  . Pneumococcal Polysaccharide-23 06/03/2006  . Td 03/07/2008  . Zoster 11/14/2009    Social History  Substance Use Topics  . Smoking status: Former Smoker    Packs/day: 1.00    Types: Cigarettes    Quit date: 03/04/1975  . Smokeless tobacco: Never Used  . Alcohol use No    Family history is   Family History  Problem Relation Age of Onset  . Prostate cancer Father 9       passed with prostate ca  . Cancer Father   . Stroke Father   . Heart disease Mother   . Hypertension Mother   . Heart attack Mother       Review of Systems  DATA OBTAINED: from patient, nurse GENERAL:  no fevers, fatigue, appetite changes SKIN: No itching, or rash EYES: No eye pain, redness, discharge EARS: No earache, tinnitus, change in hearing NOSE: No congestion, drainage or bleeding  MOUTH/THROAT: No mouth or tooth pain, No sore throat RESPIRATORY: No cough, wheezing, SOB CARDIAC: No chest pain, palpitations, lower extremity edema  GI: No abdominal pain, No N/V/D or constipation, No heartburn or reflux  GU: No dysuria, frequency or urgency, or incontinence  MUSCULOSKELETAL: No unrelieved bone/joint pain NEUROLOGIC: No headache, dizziness or focal weakness PSYCHIATRIC: No c/o anxiety or sadness   Vitals:    07/30/16 1217  BP: 125/90  Pulse: 68  Resp: 16  Temp: (!) 96.9 F (36.1 C)    SpO2 Readings from Last 1 Encounters:  07/28/16 100%  Body mass index is 29.86 kg/m.     Physical Exam  GENERAL APPEARANCE: Alert,  No acute distress.  SKIN: No diaphoresis rash HEAD: Normocephalic, atraumatic  EYES: Conjunctiva/lids clear. Pupils round, reactive. EOMs intact.  EARS: External exam WNL, canals clear. Hearing grossly normal.  NOSE: No deformity or discharge.  MOUTH/THROAT: Lips w/o lesions  RESPIRATORY: Breathing is even, unlabored. Lung sounds are clear   CARDIOVASCULAR: Heart RRR no murmurs, rubs or gallops. No peripheral edema.   GASTROINTESTINAL: Abdomen is soft, non-tender, not distended w/ normal bowel sounds. GENITOURINARY: Bladder non tender, not distended  MUSCULOSKELETAL: No abnormal joints or musculature NEUROLOGIC:  Cranial nerves 2-12 grossly intact. Moves all extremities  PSYCHIATRIC: Mood and affect appropriate to situation, no behavioral issues  Patient Active Problem List   Diagnosis Date Noted  . Constipation 07/29/2016  . UTI (urinary tract infection) 07/20/2016  . Intractable back pain 07/20/2016  . Closed compression fracture of L2 lumbar vertebra ([place]) 07/15/2016  . Radiculitis 07/15/2016  . Gait disorder 07/15/2016  . Acute UTI 07/07/2016  . Low back pain 07/04/2016  . Left-sided nosebleed 06/12/2016  . Epistaxis 04/11/2016  . Degenerative arthritis of right knee 09/20/2015  . Pronation deformity of both feet 09/20/2015  . Acute on chronic diastolic heart failure ([place]) 09/19/2015  . Dyspnea 09/07/2015  . Peripheral edema 09/07/2015  . Right knee pain 09/07/2015  . Hematuria 06/13/2015  . AKI (acute kidney injury) ([place]) 06/02/2015  . Elevated INR 06/01/2015  . Anemia 06/01/2015  . Hemorrhagic cystitis 06/01/2015  . Cough 04/20/2014  . Bradycardia 01/06/2014  . Diverticulosis of colon with hemorrhage 01/01/2014  . Perianal dermatitis 01/01/2014    . Chronic anemia 12/29/2013  . Lower GI bleed 12/29/2013  . Warfarin-induced coagulopathy ([place]) 12/29/2013  . Acute blood loss anemia 12/29/2013  . Chronic diastolic CHF (congestive heart failure) ([place]) 11/02/2013  . Syncope 07/19/2013  . Advanced care planning/counseling discussion 04/12/2013  . Encounter for therapeutic drug monitoring 04/12/2013  . Insomnia 04/11/2013  . Mitral regurgitation   . Ascending aortic dissection ([place]) 04/16/2012  . Malignant neoplasm of prostate ([place]) 10/27/2011  . Warfarin anticoagulation   . Hypertension   . GERD (gastroesophageal reflux disease)   . Aortic stenosis   . Pulmonary hypertension ([place])   . Hyperlipidemia   . Ejection fraction   . Normal nuclear stress test   . Routine general medical examination at a health care facility 08/22/2010  . Bladder cancer ([place])   . Gout   . Chronic atrial fibrillation ([place])   . HYPERTROPHY PROSTATE W/O UR OBST & OTH LUTS 09/07/2007  . DEGENERATIVE JOINT DISEASE, GENERALIZED 09/07/2007  . NEOP, MALIGNANT, BLADDER NEC 12/04/2006  . Diabetes mellitus with neuropathy ([place]) 12/04/2006      Labs reviewed: Basic Metabolic Panel:    Component Value Date/Time   NA 140 07/28/2016 0520   NA 140 07/28/2016   NA 138 04/07/2016 1311   K 3.7 07/28/2016 0520   K 4.0 04/07/2016 1311   CL 99 (L) 07/28/2016 0520   CO2 30 07/28/2016 0520   CO2 27 04/07/2016 1311   GLUCOSE 117 (H) 07/28/2016 0520   GLUCOSE 149 (H) 04/07/2016 1311   BUN 24 (H) 07/28/2016 0520   BUN 24 (A) 07/28/2016   BUN 12.4 04/07/2016 1311   CREATININE 0.97 07/28/2016 0520   CREATININE 1.0 04/07/2016 1311   CALCIUM 9.7 07/28/2016 0520   CALCIUM 10.2 04/07/2016 1311   PROT 7.9 07/10/2016 1546   PROT 7.2 07/07/2016 1725  PROT 8.0 04/07/2016 1311   ALBUMIN 4.4 07/10/2016 1546   ALBUMIN 4.4 07/07/2016 1725   ALBUMIN 4.2 04/07/2016 1311   AST 38 07/10/2016 1546   AST 21 04/07/2016 1311   ALT 27 07/10/2016 1546   ALT 13 04/07/2016 1311    ALKPHOS 81 07/10/2016 1546   ALKPHOS 96 04/07/2016 1311   BILITOT 1.8 (H) 07/10/2016 1546   BILITOT 0.9 07/07/2016 1725   BILITOT 1.49 (H) 04/07/2016 1311   GFRNONAA >60 07/28/2016 0520   GFRAA >60 07/28/2016 0520     Recent Labs  07/22/16 0531  07/26/16 0537 07/27/16 07/27/16 0511 07/28/16 07/28/16 0520  NA 137  < > 139 138 138 140 140  K 3.6  < > 3.5 3.8 3.8  --  3.7  CL 99*  < > 100*  --  99*  --  99*  CO2 30  < > 28  --  30  --  30  GLUCOSE 126*  < > 112*  --  114*  --  117*  BUN 15  < > 22* 24* 24* 24* 24*  CREATININE 0.93  < > 0.77 1.0 1.01 1.0 0.97  CALCIUM 9.2  < > 9.7  --  9.5  --  9.7  MG 1.7  --  1.7  --   --   --  1.7  < > = values in this interval not displayed. Liver Function Tests:  Recent Labs  04/07/16 1311 07/07/16 1725 07/10/16 1546  AST 21 25 38  ALT _0 ALKPHOS 96 80 81  BILITOT 1.49* 0.9 1.8*  PROT 8.0 7.2 7.9  ALBUMIN 4.2 4.4 4.4   No results for input(s): LIPASE, AMYLASE in the last 8760 hours. No results for input(s): AMMONIA in the last 8760 hours. CBC:  Recent Labs  07/10/16 1546  07/20/16 0505  07/25/16 0527  07/26/16 0537 07/27/16 07/27/16 0511 07/28/16 07/28/16 0520  WBC 15.9*  < > 12.2*  < > 11.8*  < > 9.9 10.0 10.0 9.1 9.1  NEUTROABS 13.6*  --  9.7*  --  10.4*  --   --   --   --   --   --   HGB 14.3  < > 13.2  < > 12.7*  < > 13.0 14.0 14.0  --  14.0  HCT 41.7  < > 40.6  < > 38.6*  < > 39.6 44 44.1  --  42.5  MCV 92.3  < > 95.3  < > 94.8  --  94.3  --  95.2  --  95.3  PLT 252  < > 197  < > 216  < > 207 225 225  --  227  < > = values in this interval not displayed. Lipid No results for input(s): CHOL, HDL, LDLCALC, TRIG in the last 8760 hours.  Cardiac Enzymes: No results for input(s): CKTOTAL, CKMB, CKMBINDEX, TROPONINI in the last 8760 hours. BNP:  Recent Labs  09/26/15 1053  BNP 334.0*   Lab Results  Component Value Date   MICROALBUR 4.1 (H) 04/10/2015   Lab Results  Component Value Date   HGBA1C  5.9 (H) 07/22/2016   Lab Results  Component Value Date   TSH 1.48 04/10/2015   Lab Results  Component Value Date   VITAMINB12 681 10/10/2015   No results found for: FOLATE Lab Results  Component Value Date   IRON 99 12/10/2015   TIBC 361 12/10/2015   FERRITIN 74 04/07/2016  Imaging and Procedures obtained prior to SNF admission: Dg Lumbar Spine Complete  Result Date: 07/19/2016 CLINICAL DATA:  Severe low back pain chronically. EXAM: LUMBAR SPINE - COMPLETE 4+ VIEW COMPARISON:  CT 07/10/2016 and 06/11/2015 FINDINGS: Diffuse osteopenia. Mild spondylosis throughout the lumbar spine. Subtle grade 1 anterolisthesis of L4 on L5 likely due to the moderate facet arthropathy. Disc space narrowing at the L4-5 level. Moderate L2 compression fracture unchanged from recent CT and new compared to 06/11/2015. Calcified plaque over the abdominal aorta. IMPRESSION: Moderate L2 compression fracture age indeterminate, unchanged from the recent CT and new since 06/11/2015. Mild spondylosis of the lumbar spine with disc disease at the L4-5 level. Grade 1 anterolisthesis of L4 on L5 due to the moderate facet arthropathy. Electronically Signed   By: Marin Olp M.D.   On: 07/19/2016 21:04   Ct Renal Stone Study  Result Date: 07/19/2016 CLINICAL DATA:  Flank pain, possible UTI EXAM: CT ABDOMEN AND PELVIS WITHOUT CONTRAST TECHNIQUE: Multidetector CT imaging of the abdomen and pelvis was performed following the standard protocol without IV contrast. COMPARISON:  07/10/2016 FINDINGS: Lower chest: Small right pleural effusion. Hepatobiliary: Liver is grossly unremarkable. Layering small gallstones (series 2/ image 23). No associated inflammatory changes. Pancreas: Within normal limits. Spleen: Within normal limits. Adrenals/Urinary Tract: Adrenal glands are within normal limits. Kidneys are within normal limits. No renal or ureteral calculi. No hydronephrosis. Bladder is underdistended. Stable high density  material/ calcification along the posterior aspect of the bladder (series 2/ image 66) extending into a prostate TURP defect (series 2/image 71). Stomach/Bowel: Stomach is within normal limits. No evidence of bowel obstruction. Normal appendix (series 2/ image 54). Vascular/Lymphatic: Known thoracoabdominal aortic dissection is poorly evaluated on unenhanced CT, although a displaced intimal calcification is present. 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 Atherosclerotic calcifications of the abdominal aorta and branch vessels. No suspicious abdominopelvic lymphadenopathy. Reproductive: Brachytherapy seeds in the prostate. Postsurgical changes related to TURP defect with calcifications, as described above. Other: No abdominopelvic ascites. Tiny fat containing periumbilical hernia. Musculoskeletal: Moderate compression fracture deformity at L2 (sagittal image 108), with 50% loss of height, mildly progressed from recent CT. Minimal retropulsion of 1-2 mm. IMPRESSION: Moderate compression fracture deformity at L2, with 50% loss of height, mildly progressed from recent CT. Minimal retropulsion of 1-2 mm. Postsurgical changes related to TURP. Dystrophic calcification along the posterior bladder and extending into the TURP defect. Cholelithiasis, without associated inflammatory changes. Small right pleural effusion. 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 No findings suspicious for metastatic disease. Electronically Signed   By: Julian Hy M.D.   On: 07/19/2016 22:31     Not all labs, radiology exams or other studies done during hospitalization come through on my EPIC note; however they are reviewed by me.    Assessment and Plan  INTRACTABLE LBP-likelydue to L2 compression fracture. --Lumbar x ray on presentation: Moderate L2 compression fracture age indeterminate, unchanged from the recent CT and new since 06/11/2015. Mild spondylosis of the  lumbar spine with disc disease at the L4-5 level. Grade 1 anterolisthesis of L4 on L5 due to the moderate facet arthropathy.; Dr Sloan Leiter discussed the case with Dr. Rosalee Kaufman on-call-who suggested that we continue with supportive measures, and if he continues to have pain consult interventional radiology for possible kyphoplasty.  -Status post kyphoplasty done by IR on 07/25/2016. Follow up with IR as an outpatient - continue brace if needed -Pain management,stool regimen - Status post trial of steroid  taper SNF - admitted for OT/PT  COAGULOPATHY---INR 5.3 on admission - Resolved. INR is 1.23today SNF - cont warfarin started yesterday; wil be actively titrated  CITROBACTER  UTI -CT renal stone:no hydro, no renal stone, Bladder is underdistended. Stable high density material/calcification along the posterior aspect of the bladder (series 2/image 66) extending into a prostate TURP defect (series 2/image 71). -urine culture Citrobacter that is resistant to rocephin, changedabx to cipro on 5/22 which was again held because of bradycardia and switched to cefepime. Cefepime was changed to Cipro on 07/26/2016. Discontinue ciprofloxacin after 07/27/2016 - CT scan finding was discussed with urology on call Dr Karsten Ro who recommended finish abx treatment and outpatient follow with Dr Tresa Moore within one week of discharge to consider cystoscopyand stone removal SNF -will set up for outpt urology    CHRONIC AF-currently rate controlled without the use of any rate control agent ( h/o bradycardia on betablocker) SNF - cont coumadin and acrively titrate  CHRONIC dCHF-  SNF - euvolemic; cony lasix 40 mg daily  DM2 - on SSI in hosp SNF - restart home med, januvia 100 mg daily  HTN SNF - controlled; cont norvasc 2.5 mg daily and lasix 40 mg daily  BPH/-s/p PROSTATE CA- TURP 2014 SNF - cont prescar and flomax; f/u Urology as out pt  HLD SNF - not stated as uncontrlled; cont lipitor 20 mg  daily  GOUT- no evidence of flare SNF - cont allopurinol 300 mg dailyu   Time spent > 45 min;> 50% of time with patient was spent reviewing records, labs, tests and studies, counseling and developing plan of care  Webb Silversmith D. Sheppard Coil, MD

## 2016-07-30 NOTE — Telephone Encounter (Signed)
This is a patient of [place], who was admitted to [facility] after hospitalization. [facility] F/U is needed. Hospital discharge from [facility] on 07/28/16

## 2016-07-31 LAB — HEMOGLOBIN A1C: HEMOGLOBIN A1C: 5.9

## 2016-07-31 LAB — PROTIME-INR: PROTIME: 20.9 — AB (ref 10.0–13.8)

## 2016-07-31 LAB — POCT INR: INR: 2 — AB (ref 0.9–1.1)

## 2016-08-04 ENCOUNTER — Encounter: Payer: Self-pay | Admitting: *Deleted

## 2016-08-04 LAB — CBC AND DIFFERENTIAL
HEMATOCRIT: 42 % (ref 41–53)
HEMOGLOBIN: 13.7 g/dL (ref 13.5–17.5)
PLATELETS: 241 10*3/uL (ref 150–399)
WBC: 9.8 10*3/mL

## 2016-08-04 LAB — BASIC METABOLIC PANEL
BUN: 17 mg/dL (ref 4–21)
Creatinine: 0.9 mg/dL (ref 0.6–1.3)
GLUCOSE: 96 mg/dL
Potassium: 4.1 mmol/L (ref 3.4–5.3)
Sodium: 138 mmol/L (ref 137–147)

## 2016-08-06 ENCOUNTER — Ambulatory Visit: Payer: Medicare Other

## 2016-08-09 DIAGNOSIS — A498 Other bacterial infections of unspecified site: Secondary | ICD-10-CM | POA: Insufficient documentation

## 2016-08-15 ENCOUNTER — Encounter: Payer: Self-pay | Admitting: Internal Medicine

## 2016-08-15 ENCOUNTER — Non-Acute Institutional Stay (SKILLED_NURSING_FACILITY): Payer: Medicare Other | Admitting: Internal Medicine

## 2016-08-15 DIAGNOSIS — Z7901 Long term (current) use of anticoagulants: Secondary | ICD-10-CM

## 2016-08-15 DIAGNOSIS — I482 Chronic atrial fibrillation, unspecified: Secondary | ICD-10-CM

## 2016-08-15 DIAGNOSIS — Z5181 Encounter for therapeutic drug level monitoring: Secondary | ICD-10-CM

## 2016-08-15 NOTE — Progress Notes (Signed)
Location:  [place] Room Number: 32 Place of Service:  SNF ([phone]) Provider:    Blanchie Serve, MD  Patient Care Team: Blanchie Serve, MD as PCP - General (Internal Medicine) Carlena Bjornstad, MD (Cardiology) Earnie Larsson, MD (Neurosurgery) Rutherford Guys, MD (Ophthalmology) Renda Rolls, Jennefer Bravo, MD as Referring Physician (Dermatology) Alexis Frock, MD as Consulting Physician (Urology) Dorothy Spark, MD as Consulting Physician (Cardiology)  Extended Emergency Contact Information Primary Emergency Contact: [place], [place] Phone: [phone] Mobile Phone: [phone] Relation: Friend Secondary Emergency Contact: Ervin Knack of [place] Phone: [phone] Relation: Friend  Code Status:  DNR Goals of care: Advanced Directive information Advanced Directives 08/15/2016  Does Patient Have a Medical Advance Directive? Yes  Type of Advance Directive Out of facility DNR (pink MOST or yellow form);Living will;Healthcare Power of Attorney  Does patient want to make changes to medical advance directive? -  Copy of [place] in Chart? No - copy requested  Would patient like information on creating a medical advance directive? -  Pre-existing out of facility DNR order (yellow form or pink MOST form) -     Chief Complaint  Patient presents with  . Acute Visit    abnormal INR    HPI:  Pt is a 83 y.o. male seen today for an acute visit for anticoagulation follow up. He is on warfarin for afib. His INR on review is subtherapeutic. He is seen in his room and denies any concern.    Past Medical History:  Diagnosis Date  . ACE-inhibitor cough   . Aortic dissection [facility])    Surgical repair February, 2014  . Aortic stenosis    mild....echo... september... 2010/ mild... echo.[name] KitchenMarland KitchenDec, 2011  . Atrial fibrillation (HCC)     Chronic,   24 hour holter, September,  2011.... atrial fib rate is controlled.... there is some bradycardia but  no marked pauses  . Bladder cancer [facility])    recurrence with TUR-B March '09  . DDD (degenerative disc disease)    lumbar spine  . Diabetes mellitus    type 2  . Diabetes mellitus with neuropathy ([place]) 12/04/2006   Qualifier: Diagnosis of  By: Linda Hedges MD, Heinz Knuckles  medications - DPP4   . Diverticulosis of colon with hemorrhage 01/01/2014  . Ejection fraction    EF 60%, echo, 01/2010  . Fluid overload   . Fracture of metacarpal bone 11/30/11  . GERD (gastroesophageal reflux disease)   . Gout   . History of echocardiogram    Echo 5/17 - mild LVH, EF 55-60%, mod AS (mean 16 mmHg, peak 29 mmHg), MAC, midl MR, massive BAE, mod dilated RV, low normal RVSF, mod TR, PASP 64 mmHg  . Hx of colonoscopy    approx. 10 years  . Hypercholesterolemia   . Hyperlipidemia   . Hypertension   . Mitral regurgitation   . Normal nuclear stress test    06/2005 , also ABI normal 2007  . Prostate cancer ([place])    radiation tx.  . Pulmonary hypertension ([place])    87mHg, echo, 01/2010  . Shortness of breath    on exertion  . Warfarin anticoagulation    Atrial fib  . Whooping cough    Past Surgical History:  Procedure Laterality Date  . ASCENDING AORTIC ROOT REPLACEMENT N/A 04/15/2012   Procedure: ASCENDING AORTIC ROOT REPLACEMENT;  Surgeon: BGaye Pollack  MD;  Location: [facility] OR;  Service: Open Heart Surgery;  Laterality: N/A;  . bladder cancer biopsy  10/16/2010   negative for malignancy  . bladder microscopic  04/13/2007   high grade papillary urothelial lesions  . COLONOSCOPY N/A 01/01/2014   Procedure: COLONOSCOPY;  Surgeon: Gatha Mayer, MD;  Location: [facility] ENDOSCOPY;  Service: Endoscopy;  Laterality: N/A;  . CYSTOSCOPY/RETROGRADE/URETEROSCOPY Bilateral 04/04/2013   Procedure: CYSTOSCOPY WITH RETROGRADE PYELOGRAM AND BLADDER BIOPSY;  Surgeon: Molli Hazard, MD;  Location: [facility] ORS;  Service: Urology;  Laterality: Bilateral;  .  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 EXPLORATION POST OPERATIVE OPEN HEART  04/2012  . Herniatic disc surgery    . IR KYPHO LUMBAR INC FX REDUCE BONE BX UNI/BIL CANNULATION INC/IMAGING  07/25/2016  . LUMBAR LAMINECTOMY     '02  . PROSTATE BIOPSY  09/25/2011   GLEASON 3+3=6 AND 4+3=7  . TEE WITHOUT CARDIOVERSION N/A 04/15/2012   Procedure: TRANSESOPHAGEAL ECHOCARDIOGRAM (TEE);  Surgeon: Gaye Pollack, MD;  Location: [place];  Service: Open Heart Surgery;  Laterality: N/A;  . TRANSURETHRAL RESECTION OF BLADDER TUMOR N/A 06/13/2015   Procedure: TRANSURETHRAL RESECTION OF PROSTATE (TURP) CLOT EVACUATION AND FULGERATION, BLADDER STONE REMOVAL ;  Surgeon: Alexis Frock, MD;  Location: [facility] ORS;  Service: Urology;  Laterality: N/A;  . TUR-BT '06, '09      Allergies  Allergen Reactions  . Metoprolol Other (See Comments)    Reaction:  Bradycardia with 65m BID dose    Outpatient Encounter Prescriptions as of 08/15/2016  Medication Sig  . acetaminophen (TYLENOL) 500 MG tablet Take 1,000 mg by mouth every 8 (eight) hours as needed for mild pain, moderate pain, fever or headache.   . allopurinol (ZYLOPRIM) 300 MG tablet take 1 tablet by mouth once daily  . amLODipine (NORVASC) 2.5 MG tablet take 1 tablet by mouth once daily  . atorvastatin (LIPITOR) 20 MG tablet take 1 tablet by mouth once daily  . Calcium Carbonate-Vitamin D (CALCIUM 600+D) 600-400 MG-UNIT tablet Take 1 tablet by mouth at bedtime.  .[name] Kitchendesonide (DESOWEN) 0.05 % lotion Apply 1 application topically daily as needed (rosacea).   . docusate sodium (COLACE) 100 MG capsule Take 1 capsule (100 mg total) by mouth every 12 (twelve) hours.  . ferrous sulfate 325 (65 FE) MG tablet Take 650 mg by mouth daily with breakfast.  . finasteride (PROSCAR) 5 MG tablet Take 1 tablet (5 mg total) by mouth every morning.  . furosemide (LASIX) 40 MG tablet Take 1 tablet (40 mg total) by mouth daily.  .[name] Kitchengabapentin  (NEURONTIN) 100 MG capsule Take 1 capsule (100 mg total) by mouth 3 (three) times daily.  .[name] KitchenJANUVIA 100 MG tablet take 1 tablet by mouth once daily  . ketoconazole (NIZORAL) 2 % shampoo Apply 1 application topically 4 (four) times a week.  . mirabegron ER (MYRBETRIQ) 50 MG TB24 tablet Take 50 mg by mouth daily.  . Multiple Vitamin (THERAPEUTIC MULTIVITAMIN PO) Take 1 tablet by mouth daily.  .[name] Kitchenomeprazole (PRILOSEC) 20 MG capsule Take 20 mg by mouth daily.  .[name] KitchenoxyCODONE (ROXICODONE) 5 MG immediate release tablet Take 1 tablet (5 mg total) by mouth every 6 (six) hours as needed for severe pain.  . polyethylene glycol (MIRALAX / GLYCOLAX) packet Take 17 g by mouth daily as needed.  . potassium chloride (K-DUR,KLOR-CON) 10 MEQ tablet take 1 tablet by mouth once daily  . senna (SENOKOT) 8.6 MG tablet  Take 1 tablet by mouth at bedtime.   . Tamsulosin HCl (FLOMAX) 0.4 MG CAPS Take 0.4 mg by mouth at bedtime.   [name] Kitchen tiZANidine (ZANAFLEX) 4 MG tablet Take 1 tablet (4 mg total) by mouth every 6 (six) hours as needed for muscle spasms.  . traMADol (ULTRAM) 50 MG tablet Take 1 tablet (50 mg total) by mouth every 6 (six) hours as needed for moderate pain.  . traZODone (DESYREL) 50 MG tablet Take 1 tablet (50 mg total) by mouth See admin instructions. (Patient taking differently: Take 50 mg by mouth at bedtime. )  . warfarin (COUMADIN) 5 MG tablet Take 1 tablet (5 mg total) by mouth daily at 6 PM.   No facility-administered encounter medications on file as of 08/15/2016.     Review of Systems  Constitutional: Negative for activity change, appetite change and fever.  HENT: Negative for nosebleeds.   Respiratory: Negative for shortness of breath.   Cardiovascular: Negative for chest pain and palpitations.  Gastrointestinal: Negative for blood in stool.  Genitourinary: Negative for hematuria.  Skin: Negative for rash.       Bruises easily  Hematological: Bruises/bleeds easily.    Immunization History    Administered Date(s) Administered  . H1N1 03/07/2008  . Influenza Split 12/04/2010, 11/10/2011  . Influenza Whole 12/06/2007, 11/29/2008, 11/12/2009  . Influenza, High Dose Seasonal PF 12/02/2012  . Influenza,inj,Quad PF,36+ Mos 10/26/2013  . Influenza-Unspecified 11/10/2014, 12/04/2015  . Pneumococcal Conjugate-13 04/11/2013  . Pneumococcal Polysaccharide-23 06/03/2006  . Td 03/07/2008  . Zoster 11/14/2009   Pertinent  Health Maintenance Due  Topic Date Due  . OPHTHALMOLOGY EXAM  11/30/2015  . URINE MICROALBUMIN  04/09/2016  . INFLUENZA VACCINE  10/01/2016  . FOOT EXAM  01/14/2017  . HEMOGLOBIN A1C  01/22/2017  . PNA vac Low Risk Adult  Completed   Fall Risk  07/07/2016 01/15/2016 01/04/2016 10/19/2015 04/10/2015  Falls in the past year? No Yes Yes No No  Number falls in past yr: - 1 1 - -  Injury with Fall? - No No - -  Risk for fall due to : - Impaired balance/gait;Impaired mobility - - -  Follow up - Falls prevention discussed - - -   Functional Status Survey:    Vitals:   08/15/16 1201  BP: 105/66  Pulse: 65  Resp: 13  Temp: (!) 96.7 F (35.9 C)  SpO2: 97%  Weight: 187 lb (84.8 kg)  Height: _0  (1.727 m)   Body mass index is 28.43 kg/m. Physical Exam  Constitutional: He is oriented to person, place, and time. He appears well-developed and well-nourished.  HENT:  Head: Normocephalic and atraumatic.  Mouth/Throat: Oropharynx is clear and moist.  Eyes: Conjunctivae are normal. Pupils are equal, round, and reactive to light.  Neck: Normal range of motion. Neck supple.  Cardiovascular:  Controlled heart rate, irregular rhythm, systolic murmur present  Pulmonary/Chest: Effort normal and breath sounds normal.  Abdominal: Soft. Bowel sounds are normal.  Lymphadenopathy:    He has no cervical adenopathy.  Neurological: He is alert and oriented to person, place, and time.  Skin: Skin is warm and dry. He is not diaphoretic.  Few bruising noted    Labs  reviewed:  Recent Labs  07/22/16 0531  07/26/16 0537  07/27/16 0511 07/28/16 07/28/16 0520 08/04/16  NA 137  < > 139  < > 138 140 140 138  K 3.6  < > 3.5  < > 3.8  --  3.7 4.1  CL  99*  < > 100*  --  99*  --  99*  --   CO2 30  < > 28  --  30  --  30  --   GLUCOSE 126*  < > 112*  --  114*  --  117*  --   BUN 15  < > 22*  < > 24* 24* 24* 17  CREATININE 0.93  < > 0.77  < > 1.01 1.0 0.97 0.9  CALCIUM 9.2  < > 9.7  --  9.5  --  9.7  --   MG 1.7  --  1.7  --   --   --  1.7  --   < > = values in this interval not displayed.  Recent Labs  04/07/16 1311 07/07/16 1725 07/10/16 1546  AST 21 25 38  ALT _0 ALKPHOS 96 80 81  BILITOT 1.49* 0.9 1.8*  PROT 8.0 7.2 7.9  ALBUMIN 4.2 4.4 4.4    Recent Labs  07/10/16 1546  07/20/16 0505  07/25/16 0527  07/26/16 0537  07/27/16 0511 07/28/16 07/28/16 0520 08/04/16  WBC 15.9*  < > 12.2*  < > 11.8*  < > 9.9  < > 10.0 9.1 9.1 9.8  NEUTROABS 13.6*  --  9.7*  --  10.4*  --   --   --   --   --   --   --   HGB 14.3  < > 13.2  < > 12.7*  < > 13.0  < > 14.0  --  14.0 13.7  HCT 41.7  < > 40.6  < > 38.6*  < > 39.6  < > 44.1  --  42.5 42  MCV 92.3  < > 95.3  < > 94.8  --  94.3  --  95.2  --  95.3  --   PLT 252  < > 197  < > 216  < > 207  < > 225  --  227 241  < > = values in this interval not displayed. Lab Results  Component Value Date   TSH 1.48 04/10/2015   Lab Results  Component Value Date   HGBA1C 5.9 07/31/2016   Lab Results  Component Value Date   CHOL 102 04/10/2015   HDL 45.60 04/10/2015   LDLCALC 45 04/10/2015   TRIG 57.0 04/10/2015   CHOLHDL 2 04/10/2015    Significant Diagnostic Results in last 30 days:  Dg Lumbar Spine Complete  Result Date: 07/19/2016 CLINICAL DATA:  Severe low back pain chronically. EXAM: LUMBAR SPINE - COMPLETE 4+ VIEW COMPARISON:  CT 07/10/2016 and 06/11/2015 FINDINGS: Diffuse osteopenia. Mild spondylosis throughout the lumbar spine. Subtle grade 1 anterolisthesis of L4 on L5 likely due to the  moderate facet arthropathy. Disc space narrowing at the L4-5 level. Moderate L2 compression fracture unchanged from recent CT and new compared to 06/11/2015. Calcified plaque over the abdominal aorta. IMPRESSION: Moderate L2 compression fracture age indeterminate, unchanged from the recent CT and new since 06/11/2015. Mild spondylosis of the lumbar spine with disc disease at the L4-5 level. Grade 1 anterolisthesis of L4 on L5 due to the moderate facet arthropathy. Electronically Signed   By: Marin Olp M.D.   On: 07/19/2016 21:04   Ir Kypho Lumbar Inc Fx Reduce Bone Bx Uni/bil Cannulation Inc/imaging  Result Date: 07/25/2016 CLINICAL DATA:  Symptomatic L2 compression fracture. EXAM: FLUOROSCOPIC GUIDED KYPHOPLASTY OF THE L2 VERTEBRAL BODY COMPARISON:  CT abdomen pelvis - 07/19/2016; 07/10/2016; lumbar  spine MRI - 07/14/2016 MEDICATIONS: The patient is currently admitted to the hospital and receiving intravenous antibiotics. Antibiotics were administered with an appropriate time frame prior to this skin puncture. ANESTHESIA/SEDATION: Moderate (conscious) sedation was employed during this procedure. A total of Versed 6.5 mg and Fentanyl 150 mcg was administered intravenously. Moderate Sedation Time: 57 minutes. The patient's level of consciousness and vital signs were monitored continuously by radiology nursing throughout the procedure under my direct supervision. FLUOROSCOPY TIME:  14 min, 6 seconds (7846 mGy) COMPLICATIONS: None immediate. TECHNIQUE: The procedure, risks (including but not limited to bleeding, infection, organ damage), benefits, and alternatives were explained to the patient. Questions regarding the procedure were encouraged and answered. The patient understands and consents to the procedure. The patient was placed prone on the fluoroscopic table. The skin overlying the upper thoracic region was then prepped and draped in the usual sterile fashion. Maximal barrier sterile technique was  utilized including caps, mask, sterile gowns, sterile gloves, sterile drape, hand hygiene and skin antiseptic. Intravenous Fentanyl and Versed were administered as conscious sedation during continuous cardiorespiratory monitoring by the radiology RN. The left pedicle at L2 was then infiltrated with 1% lidocaine followed by the advancement of a Kyphon trocar needle through the left pedicle into the posterior one-third of the vertebral body. Subsequently, the osteo drill was advanced to the anterior third of the vertebral body. The osteo drill was retracted. Through the working cannula, a Kyphon inflatable bone tamp 15 x 3 was advanced and positioned with the distal marker approximately 5 mm from the anterior aspect of the cortex. Appropriate positioning was confirmed on the AP projection. At this time, the balloon was expanded using contrast via a Kyphon inflation syringe device via micro tubing. In similar fashion, the right L2 pedicle was infiltrated with 1% lidocaine followed by the advancement of a second Kyphon trocar needle through the right pedicle into the posterior third of the vertebral body. Subsequently, the osteo drill was coaxially advanced to the anterior right third. The osteo drill was exchanged for a Kyphon inflatable bone tamp 15 x 3, advanced to the 5 mm of the anterior aspect of the cortex. The balloon was then expanded using contrast as above. Inflations were continued until there was near apposition with the superior end plate. At this time, methylmethacrylate mixture was reconstituted in the Kyphon bone mixing device system. This was then loaded into the delivery mechanism, attached to Kyphon bone fillers. The balloons were deflated and removed followed by the instillation of methylmethacrylate mixture with excellent filling in the AP and lateral projections. No extravasation was noted in the disk spaces or posteriorly into the spinal canal. No epidural venous contamination was seen. The  working cannulae and the bone filler were then retrieved and removed. Hemostasis was achieved with manual compression. The patient tolerated the procedure well without immediate postprocedural complication. IMPRESSION: 1. Technically successful L2 vertebral body augmentation using balloon kyphoplasty. 2. Per CMS PQRS reporting requirements (PQRS Measure 24): Given the patient's age of greater than 71 and the fracture site (hip, distal radius, or spine), the patient should be tested for osteoporosis using DXA, and the appropriate treatment considered based on the DXA results. Electronically Signed   By: Sandi Mariscal M.D.   On: 07/25/2016 16:54   Ct Renal Stone Study  Result Date: 07/19/2016 CLINICAL DATA:  Flank pain, possible UTI EXAM: CT ABDOMEN AND PELVIS WITHOUT CONTRAST TECHNIQUE: Multidetector CT imaging of the abdomen and pelvis was performed following the standard protocol without  IV contrast. COMPARISON:  07/10/2016 FINDINGS: Lower chest: Small right pleural effusion. Hepatobiliary: Liver is grossly unremarkable. Layering small gallstones (series 2/ image 73). No associated inflammatory changes. Pancreas: Within normal limits. Spleen: Within normal limits. Adrenals/Urinary Tract: Adrenal glands are within normal limits. Kidneys are within normal limits. No renal or ureteral calculi. No hydronephrosis. Bladder is underdistended. Stable high density material/ calcification along the posterior aspect of the bladder (series 2/ image 66) extending into a prostate TURP defect (series 2/image 71). Stomach/Bowel: Stomach is within normal limits. No evidence of bowel obstruction. Normal appendix (series 2/ image 54). Vascular/Lymphatic: Known thoracoabdominal aortic dissection is poorly evaluated on unenhanced CT, although a displaced intimal calcification is present. 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 Atherosclerotic calcifications of the abdominal  aorta and branch vessels. No suspicious abdominopelvic lymphadenopathy. Reproductive: Brachytherapy seeds in the prostate. Postsurgical changes related to TURP defect with calcifications, as described above. Other: No abdominopelvic ascites. Tiny fat containing periumbilical hernia. Musculoskeletal: Moderate compression fracture deformity at L2 (sagittal image 108), with 50% loss of height, mildly progressed from recent CT. Minimal retropulsion of 1-2 mm. IMPRESSION: Moderate compression fracture deformity at L2, with 50% loss of height, mildly progressed from recent CT. Minimal retropulsion of 1-2 mm. Postsurgical changes related to TURP. Dystrophic calcification along the posterior bladder and extending into the TURP defect. Cholelithiasis, without associated inflammatory changes. Small right pleural effusion. 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 No findings suspicious for metastatic disease. Electronically Signed   By: Julian Hy M.D.   On: 07/19/2016 22:31    Assessment/Plan  afib Controlled heart rate. Not on any rate controlling agent. Currently on warfarin for stroke prophylaxis.   Long term anticoagulation For afib, monitor inr on regular interval  Subtherapeutic inr inr today 1.9. Currently on coumadin 3 mg daily, increase this to 4 mg daily. Check inr 08/19/16.goal inr 2-3   Family/ staff Communication: reviewed care plan with patient and charge nurse.    Labs/tests ordered:  INR  Blanchie Serve, MD Internal Medicine [facility] [address] [place], [place] [address] Cell Phone (Monday-Friday 8 am - 5 pm): [phone] On Call: [phone] and follow prompts after 5 pm and on weekends Office Phone: [phone] Office Fax: [phone]

## 2016-08-19 DIAGNOSIS — C61 Malignant neoplasm of prostate: Secondary | ICD-10-CM | POA: Diagnosis not present

## 2016-08-19 DIAGNOSIS — Z8551 Personal history of malignant neoplasm of bladder: Secondary | ICD-10-CM | POA: Diagnosis not present

## 2016-08-26 ENCOUNTER — Ambulatory Visit
Admission: RE | Admit: 2016-08-26 | Discharge: 2016-08-26 | Disposition: A | Discharge status: Home / Self Care | Payer: Medicare Other | Source: Ambulatory Visit | Attending: General Surgery | Admitting: General Surgery

## 2016-08-26 DIAGNOSIS — S32020A Wedge compression fracture of second lumbar vertebra, initial encounter for closed fracture: Secondary | ICD-10-CM

## 2016-08-26 DIAGNOSIS — Z9889 Other specified postprocedural states: Secondary | ICD-10-CM | POA: Diagnosis not present

## 2016-08-26 DIAGNOSIS — S32028S Other fracture of second lumbar vertebra, sequela: Secondary | ICD-10-CM | POA: Diagnosis not present

## 2016-08-26 NOTE — Progress Notes (Signed)
Patient ID: Eric Rivers, male   DOB: 07/20/1930, 83 y.o.   MRN: [id number]   Referring Physician(s): Dr. Florencia Reasons  Chief Complaint: The patient is seen in follow up today s/p KP of L2 compression fracture  History of present illness:  Eric Rivers is an 83 yo male who was seen in the hospital secondary to a symptomatic L2 compression fracture.  He underwent a L2 KP on 07-25-16 by Dr. Pascal Lux.  He is currently at a facility and states he is doing his activities with no issues.  However, his activities include laying in bed, ambulating to the restroom, and sitting in his chair.  He does not remember the procedure itself.  He has no other complaints currently.  Past Medical History:  Diagnosis Date  . ACE-inhibitor cough   . Aortic dissection [facility])    Surgical repair February, 2014  . Aortic stenosis    mild....echo... september... 2010/ mild... echo.[name] KitchenMarland KitchenDec, 2011  . Atrial fibrillation (HCC)     Chronic,   24 hour holter, September, 2011.... atrial fib rate is controlled.... there is some bradycardia but  no marked pauses  . Bladder cancer [facility])    recurrence with TUR-B March '09  . DDD (degenerative disc disease)    lumbar spine  . Diabetes mellitus    type 2  . Diabetes mellitus with neuropathy ([place]) 12/04/2006   Qualifier: Diagnosis of  By: Linda Hedges MD, Heinz Knuckles  medications - DPP4   . Diverticulosis of colon with hemorrhage 01/01/2014  . Ejection fraction    EF 60%, echo, 01/2010  . Fluid overload   . Fracture of metacarpal bone 11/30/11  . GERD (gastroesophageal reflux disease)   . Gout   . History of echocardiogram    Echo 5/17 - mild LVH, EF 55-60%, mod AS (mean 16 mmHg, peak 29 mmHg), MAC, midl MR, massive BAE, mod dilated RV, low normal RVSF, mod TR, PASP 64 mmHg  . Hx of colonoscopy    approx. 10 years  . Hypercholesterolemia   . Hyperlipidemia   . Hypertension   . Mitral regurgitation   . Normal nuclear stress test    06/2005 , also ABI normal 2007  . Prostate cancer  ([place])    radiation tx.  . Pulmonary hypertension ([place])    81mHg, echo, 01/2010  . Shortness of breath    on exertion  . Warfarin anticoagulation    Atrial fib  . Whooping cough     Past Surgical History:  Procedure Laterality Date  . ASCENDING AORTIC ROOT REPLACEMENT N/A 04/15/2012   Procedure: ASCENDING AORTIC ROOT REPLACEMENT;  Surgeon: BGaye Pollack MD;  Location: [facility]  Service: Open Heart Surgery;  Laterality: N/A;  . bladder cancer biopsy  10/16/2010   negative for malignancy  . bladder microscopic  04/13/2007   high grade papillary urothelial lesions  . COLONOSCOPY N/A 01/01/2014   Procedure: COLONOSCOPY;  Surgeon: CGatha Mayer MD;  Location: [facility];  Service: Endoscopy;  Laterality: N/A;  . CYSTOSCOPY/RETROGRADE/URETEROSCOPY Bilateral 04/04/2013   Procedure: CYSTOSCOPY WITH RETROGRADE PYELOGRAM AND BLADDER BIOPSY;  Surgeon: DMolli Hazard MD;  Location: [facility];  Service: Urology;  Laterality: Bilateral;  . 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 EXPLORATION POST OPERATIVE OPEN HEART  04/2012  . Herniatic disc surgery    . IR KYPHO LUMBAR INC FX REDUCE BONE BX UNI/BIL CANNULATION INC/IMAGING  07/25/2016  . LUMBAR LAMINECTOMY     '02  .  PROSTATE BIOPSY  09/25/2011   GLEASON 3+3=6 AND 4+3=7  . TEE WITHOUT CARDIOVERSION N/A 04/15/2012   Procedure: TRANSESOPHAGEAL ECHOCARDIOGRAM (TEE);  Surgeon: Gaye Pollack, MD;  Location: [place];  Service: Open Heart Surgery;  Laterality: N/A;  . TRANSURETHRAL RESECTION OF BLADDER TUMOR N/A 06/13/2015   Procedure: TRANSURETHRAL RESECTION OF PROSTATE (TURP) CLOT EVACUATION AND FULGERATION, BLADDER STONE REMOVAL ;  Surgeon: Alexis Frock, MD;  Location: [facility];  Service: Urology;  Laterality: N/A;  . TUR-BT '06, '09      Allergies: Metoprolol  Medications: Prior to Admission medications   Medication Sig Start Date End Date Taking? Authorizing Provider  acetaminophen (TYLENOL) 500 MG  tablet Take 1,000 mg by mouth every 8 (eight) hours as needed for mild pain, moderate pain, fever or headache.    Yes [provider]  allopurinol (ZYLOPRIM) 300 MG tablet take 1 tablet by mouth once daily 02/12/16  Yes Biagio Borg, MD  amLODipine (NORVASC) 2.5 MG tablet take 1 tablet by mouth once daily 07/16/16  Yes Dorothy Spark, MD  atorvastatin (LIPITOR) 20 MG tablet take 1 tablet by mouth once daily 02/12/16  Yes Biagio Borg, MD  Calcium Carbonate-Vitamin D (CALCIUM 600+D) 600-400 MG-UNIT tablet Take 1 tablet by mouth at bedtime.   Yes [provider]  desonide (DESOWEN) 0.05 % lotion Apply 1 application topically daily as needed (rosacea).    Yes [provider]  docusate sodium (COLACE) 100 MG capsule Take 1 capsule (100 mg total) by mouth every 12 (twelve) hours. 07/10/16  Yes Mesner, [name], MD  ferrous sulfate 325 (65 FE) MG tablet Take 650 mg by mouth daily with breakfast.   Yes [provider]  finasteride (PROSCAR) 5 MG tablet Take 1 tablet (5 mg total) by mouth every morning. 12/23/13  Yes Biagio Borg, MD  furosemide (LASIX) 40 MG tablet Take 1 tablet (40 mg total) by mouth daily. 11/12/15  Yes Imogene Burn, PA-C  gabapentin (NEURONTIN) 100 MG capsule Take 1 capsule (100 mg total) by mouth 3 (three) times daily. 07/15/16  Yes Biagio Borg, MD  JANUVIA 100 MG tablet take 1 tablet by mouth once daily 06/23/16  Yes Biagio Borg, MD  ketoconazole (NIZORAL) 2 % shampoo Apply 1 application topically 4 (four) times a week.   Yes [provider]  mirabegron ER (MYRBETRIQ) 50 MG TB24 tablet Take 50 mg by mouth daily.   Yes [provider]  Multiple Vitamin (THERAPEUTIC MULTIVITAMIN PO) Take 1 tablet by mouth daily.   Yes [provider]  omeprazole (PRILOSEC) 20 MG capsule Take 20 mg by mouth daily.   Yes [provider]  oxyCODONE (ROXICODONE) 5 MG immediate release tablet Take 1 tablet (5 mg total) by mouth  every 6 (six) hours as needed for severe pain. 07/28/16  Yes Aline August, MD  polyethylene glycol (MIRALAX / GLYCOLAX) packet Take 17 g by mouth daily as needed. 07/28/16  Yes Aline August, MD  potassium chloride (K-DUR,KLOR-CON) 10 MEQ tablet take 1 tablet by mouth once daily 07/02/16  Yes Dorothy Spark, MD  senna (SENOKOT) 8.6 MG tablet Take 1 tablet by mouth at bedtime.    Yes [provider]  Tamsulosin HCl (FLOMAX) 0.4 MG CAPS Take 0.4 mg by mouth at bedtime.    Yes [provider]  tiZANidine (ZANAFLEX) 4 MG tablet Take 1 tablet (4 mg total) by mouth every 6 (six) hours as needed for muscle spasms. 07/04/16  Yes  Biagio Borg, MD  traMADol (ULTRAM) 50 MG tablet Take 1 tablet (50 mg total) by mouth every 6 (six) hours as needed for moderate pain. 07/28/16  Yes Aline August, MD  traZODone (DESYREL) 50 MG tablet Take 1 tablet (50 mg total) by mouth See admin instructions. Patient taking differently: Take 50 mg by mouth at bedtime.  01/30/16  Yes Biagio Borg, MD  warfarin (COUMADIN) 5 MG tablet Take 1 tablet (5 mg total) by mouth daily at 6 PM. 07/28/16  Yes Aline August, MD     Family History  Problem Relation Age of Onset  . Prostate cancer Father 7       passed with prostate ca  . Cancer Father   . Stroke Father   . Heart disease Mother   . Hypertension Mother   . Heart attack Mother     Social History   Social History  . Marital status: Single    Spouse name: N/A  . Number of children: 0  . Years of education: 50   Occupational History  . history and Advice worker Retired    retired   Social History Main Topics  . Smoking status: Former Smoker    Packs/day: 1.00    Types: Cigarettes    Quit date: 03/04/1975  . Smokeless tobacco: Never Used  . Alcohol use No  . Drug use: No  . Sexual activity: Not Currently   Other Topics Concern  . Not on file   Social History Narrative   Confirmed batchelor. Retired Education officer, museum. World traveler- Network engineer. I- ADLS. End of life Care   No CPR, no prolonged intubation, no prolonged artificial hydration or feeding, no heroic or futile measures.       Next trip Sept [place]13 [place]      Last cruise - two cruises back to back to the [place] in January '15. [place] stops in [place], [place], [place] 15 days      Fall '15: 50 Days from [place]. [place], [place], [place], [place], [place], [place], [place], [place], [place].       May '15 - 15 days up the [employer] as far as [place]              Vital Signs: BP 139/73   Pulse 71   Resp 18   Ht _0  (1.727 m)   Wt 187 lb (84.8 kg)   SpO2 99%   BMI 28.43 kg/m   Physical Exam  Gen: elderly, pleasant WM in NAD Heart regular Lungs: CTAB Abd: soft, NT, ND Back: no pain to palpation  Imaging: No results found.  Labs:  CBC:  Recent Labs  07/27/16 07/27/16 0511 07/28/16 07/28/16 0520 08/04/16  WBC 10.0 10.0 9.1 9.1 9.8  HGB 14.0 14.0  --  14.0 13.7  HCT 44 44.1  --  42.5 42  PLT 225 225  --  227 241    COAGS:  Recent Labs  07/27/16 0511 07/28/16 07/28/16 0520 07/31/16  INR 1.23 1.3* 1.33 2.0*    BMP:  Recent Labs  07/25/16 0527  07/26/16 0537 07/27/16 07/27/16 0511 07/28/16 07/28/16 0520 08/04/16  NA 136  < > 139 138 138 140 140 138  K 4.5  < > 3.5 3.8 3.8  --  3.7 4.1  CL 98*  --  100*  --  99*  --  99*  --  CO2 29  --  28  --  30  --  30  --   GLUCOSE 167*  --  112*  --  114*  --  117*  --   BUN 23*  < > 22* 24* 24* 24* 24* 17  CALCIUM 9.6  --  9.7  --  9.5  --  9.7  --   CREATININE 0.93  < > 0.77 1.0 1.01 1.0 0.97 0.9  GFRNONAA >60  --  >60  --  >60  --  >60  --   GFRAA >60  --  >60  --  >60  --  >60  --   < > = values in this interval not displayed.  LIVER FUNCTION TESTS:  Recent Labs  12/10/15 1505 04/07/16 1311 07/07/16 1725 07/10/16 1546  BILITOT 0.81 1.49* 0.9 1.8*   AST _0 38  ALT _1 ALKPHOS 97 96 80 81  PROT 7.3 8.0 7.2 7.9  ALBUMIN 3.8 4.2 4.4 4.4    Assessment:  1. L2 compression fracture, s/p KP on 07-25-16  The patient is doing well with no complaints.  His back pain seems to have resolved.  He is at his baseline.  No further intervention or recommendations at this point.    Signed: Henreitta Cea, PA-C 08/26/2016, 1:45 PM   Please refer to Dr. Deniece Portela attestation of this note for management and plan.

## 2016-08-28 ENCOUNTER — Non-Acute Institutional Stay (SKILLED_NURSING_FACILITY): Payer: Medicare Other | Admitting: Family

## 2016-08-28 ENCOUNTER — Encounter: Payer: Self-pay | Admitting: Family

## 2016-08-28 ENCOUNTER — Telehealth: Payer: Self-pay | Admitting: Internal Medicine

## 2016-08-28 DIAGNOSIS — K5901 Slow transit constipation: Secondary | ICD-10-CM

## 2016-08-28 DIAGNOSIS — E782 Mixed hyperlipidemia: Secondary | ICD-10-CM | POA: Diagnosis not present

## 2016-08-28 DIAGNOSIS — I482 Chronic atrial fibrillation, unspecified: Secondary | ICD-10-CM

## 2016-08-28 DIAGNOSIS — K219 Gastro-esophageal reflux disease without esophagitis: Secondary | ICD-10-CM

## 2016-08-28 DIAGNOSIS — I5032 Chronic diastolic (congestive) heart failure: Secondary | ICD-10-CM | POA: Diagnosis not present

## 2016-08-28 DIAGNOSIS — I110 Hypertensive heart disease with heart failure: Secondary | ICD-10-CM | POA: Diagnosis not present

## 2016-08-28 DIAGNOSIS — E0840 Diabetes mellitus due to underlying condition with diabetic neuropathy, unspecified: Secondary | ICD-10-CM

## 2016-08-28 NOTE — Telephone Encounter (Signed)
Rec'd from [facility] forward 9 pages to Dr.James Jenny Reichmann

## 2016-08-29 DIAGNOSIS — F4321 Adjustment disorder with depressed mood: Secondary | ICD-10-CM | POA: Diagnosis not present

## 2016-09-03 NOTE — Progress Notes (Signed)
Location:  [facility] Room Number: 24 Place of Service:  SNF (31) Provider: Stanislawa Gaffin FNP-C   Blanchie Serve, MD  Patient Care Team: Blanchie Serve, MD as PCP - General (Internal Medicine) Carlena Bjornstad, MD (Cardiology) Earnie Larsson, MD (Neurosurgery) Rutherford Guys, MD (Ophthalmology) Renda Rolls, Jennefer Bravo, MD as Referring Physician (Dermatology) Alexis Frock, MD as Consulting Physician (Urology) Dorothy Spark, MD as Consulting Physician (Cardiology)  Extended Emergency Contact Information Primary Emergency Contact: [place], [place] Phone: [phone] Mobile Phone: [phone] Relation: Friend Secondary Emergency Contact: Ervin Knack of [place] Phone: [phone] Relation: Friend  Code Status:  DNR Goals of care: Advanced Directive information Advanced Directives 08/28/2016  Does Patient Have a Medical Advance Directive? Yes  Type of Advance Directive Out of facility DNR (pink MOST or yellow form);Living will;Healthcare Power of Attorney  Does patient want to make changes to medical advance directive? -  Copy of [place] in Chart? Yes  Would patient like information on creating a medical advance directive? -  Pre-existing out of facility DNR order (yellow form or pink MOST form) Yellow form placed in chart (order not valid for inpatient use)     Chief Complaint  Patient presents with  . Medical Management of Chronic Issues    routine visit    HPI:  Pt is a 83 y.o. male seen today [place] for medical management of chronic diseases.He has a medical history of HTN, Afib on anticoagulant , CHF, Hyperlipidemia,GERD,DDD among other conditions. He is seen in his room today. He denies any acute issues this visit. He continues to work with therapy post Kyphoplasty done 07/25/2016.No recent weight changes, fall episodes or acute issues  reported.lower back pain under control with current pain regimen. Facility Nurse reports no new concerns.    Past Medical History:  Diagnosis Date  . ACE-inhibitor cough   . Aortic dissection [facility])    Surgical repair February, 2014  . Aortic stenosis    mild....echo... september... 2010/ mild... echo.[name] KitchenMarland KitchenDec, 2011  . Atrial fibrillation (HCC)     Chronic,   24 hour holter, September, 2011.... atrial fib rate is controlled.... there is some bradycardia but  no marked pauses  . Bladder cancer [facility])    recurrence with TUR-B March '09  . DDD (degenerative disc disease)    lumbar spine  . Diabetes mellitus    type 2  . Diabetes mellitus with neuropathy ([place]) 12/04/2006   Qualifier: Diagnosis of  By: Linda Hedges MD, Heinz Knuckles  medications - DPP4   . Diverticulosis of colon with hemorrhage 01/01/2014  . Ejection fraction    EF 60%, echo, 01/2010  . Fluid overload   . Fracture of metacarpal bone 11/30/11  . GERD (gastroesophageal reflux disease)   . Gout   . History of echocardiogram    Echo 5/17 - mild LVH, EF 55-60%, mod AS (mean 16 mmHg, peak 29 mmHg), MAC, midl MR, massive BAE, mod dilated RV, low normal RVSF, mod TR, PASP 64 mmHg  . Hx of colonoscopy    approx. 10 years  . Hypercholesterolemia   . Hyperlipidemia   . Hypertension   . Mitral regurgitation   . Normal nuclear stress test    06/2005 , also ABI normal 2007  . Prostate cancer ([place])    radiation tx.  . Pulmonary hypertension ([place])    73mHg, echo, 01/2010  .  Shortness of breath    on exertion  . Warfarin anticoagulation    Atrial fib  . Whooping cough    Past Surgical History:  Procedure Laterality Date  . ASCENDING AORTIC ROOT REPLACEMENT N/A 04/15/2012   Procedure: ASCENDING AORTIC ROOT REPLACEMENT;  Surgeon: Gaye Pollack, MD;  Location: [facility];  Service: Open Heart Surgery;  Laterality: N/A;  . bladder cancer biopsy  10/16/2010   negative for malignancy  . bladder microscopic  04/13/2007   high grade papillary  urothelial lesions  . COLONOSCOPY N/A 01/01/2014   Procedure: COLONOSCOPY;  Surgeon: Gatha Mayer, MD;  Location: [facility];  Service: Endoscopy;  Laterality: N/A;  . CYSTOSCOPY/RETROGRADE/URETEROSCOPY Bilateral 04/04/2013   Procedure: CYSTOSCOPY WITH RETROGRADE PYELOGRAM AND BLADDER BIOPSY;  Surgeon: Molli Hazard, MD;  Location: [facility];  Service: Urology;  Laterality: Bilateral;  . 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 EXPLORATION POST OPERATIVE OPEN HEART  04/2012  . Herniatic disc surgery    . IR KYPHO LUMBAR INC FX REDUCE BONE BX UNI/BIL CANNULATION INC/IMAGING  07/25/2016  . LUMBAR LAMINECTOMY     '02  . PROSTATE BIOPSY  09/25/2011   [place]ASON 3+3=6 AND 4+3=7  . TEE WITHOUT CARDIOVERSION N/A 04/15/2012   Procedure: TRANSESOPHAGEAL ECHOCARDIOGRAM (TEE);  Surgeon: Gaye Pollack, MD;  Location: [facility];  Service: Open Heart Surgery;  Laterality: N/A;  . TRANSURETHRAL RESECTION OF BLADDER TUMOR N/A 06/13/2015   Procedure: TRANSURETHRAL RESECTION OF PROSTATE (TURP) CLOT EVACUATION AND FULGERATION, BLADDER STONE REMOVAL ;  Surgeon: Alexis Frock, MD;  Location: [facility];  Service: Urology;  Laterality: N/A;  . TUR-BT '06, '09      Allergies  Allergen Reactions  . Metoprolol Other (See Comments)    Reaction:  Bradycardia with 29m BID dose    Allergies as of 08/28/2016      Reactions   Metoprolol Other (See Comments)   Reaction:  Bradycardia with 258mBID dose      Medication List       Accurate as of 08/28/16 11:59 PM. Always use your most recent med list.          acetaminophen 500 MG tablet Commonly known as:  TYLENOL Take 1,000 mg by mouth every 8 (eight) hours as needed for mild pain, moderate pain, fever or headache.   allopurinol 300 MG tablet Commonly known as:  ZYLOPRIM take 1 tablet by mouth once daily   amLODipine 2.5 MG tablet Commonly known as:  NORVASC take 1 tablet by mouth once daily   atorvastatin 20 MG  tablet Commonly known as:  LIPITOR take 1 tablet by mouth once daily   CALCIUM 600+D 600-400 MG-UNIT tablet Generic drug:  Calcium Carbonate-Vitamin D Take 1 tablet by mouth at bedtime.   desonide 0.05 % lotion Commonly known as:  DESOWEN Apply 1 application topically daily as needed (rosacea).   docusate sodium 100 MG capsule Commonly known as:  COLACE Take 1 capsule (100 mg total) by mouth every 12 (twelve) hours.   ferrous sulfate 325 (65 FE) MG tablet Take 650 mg by mouth daily with breakfast.   finasteride 5 MG tablet Commonly known as:  PROSCAR Take 1 tablet (5 mg total) by mouth every morning.   furosemide 40 MG tablet Commonly known as:  LASIX Take 1 tablet (40 mg total) by mouth daily.   gabapentin 100 MG capsule Commonly known as:  NEURONTIN Take 1 capsule (100 mg total) by mouth 3 (  three) times daily.   JANUVIA 100 MG tablet Generic drug:  sitaGLIPtin take 1 tablet by mouth once daily   ketoconazole 2 % shampoo Commonly known as:  NIZORAL Apply 1 application topically 4 (four) times a week.   MYRBETRIQ 50 MG Tb24 tablet Generic drug:  mirabegron ER Take 50 mg by mouth daily.   omeprazole 20 MG capsule Commonly known as:  PRILOSEC Take 20 mg by mouth daily.   oxyCODONE 5 MG immediate release tablet Commonly known as:  ROXICODONE Take 1 tablet (5 mg total) by mouth every 6 (six) hours as needed for severe pain.   polyethylene glycol packet Commonly known as:  MIRALAX / GLYCOLAX Take 17 g by mouth daily as needed.   potassium chloride 10 MEQ tablet Commonly known as:  K-DUR,KLOR-CON take 1 tablet by mouth once daily   senna 8.6 MG tablet Commonly known as:  SENOKOT Take 1 tablet by mouth at bedtime.   tamsulosin 0.4 MG Caps capsule Commonly known as:  FLOMAX Take 0.4 mg by mouth at bedtime.   THERAPEUTIC MULTIVITAMIN PO Take 1 tablet by mouth daily.   tiZANidine 4 MG tablet Commonly known as:  ZANAFLEX Take 1 tablet (4 mg total) by mouth  every 6 (six) hours as needed for muscle spasms.   traMADol 50 MG tablet Commonly known as:  ULTRAM Take 1 tablet (50 mg total) by mouth every 6 (six) hours as needed for moderate pain.   traZODone 50 MG tablet Commonly known as:  DESYREL Take 50 mg by mouth at bedtime.   warfarin 5 MG tablet Commonly known as:  COUMADIN Take 1 tablet (5 mg total) by mouth daily at 6 PM.       Review of Systems  Constitutional: Negative for activity change, appetite change, chills, fatigue and fever.  HENT: Negative for congestion, rhinorrhea, sinus pain, sinus pressure, sneezing, sore throat and trouble swallowing.   Eyes: Negative for pain, discharge, redness and itching.       Wears eye glasses   Respiratory: Negative for cough, chest tightness, shortness of breath and wheezing.   Cardiovascular: Negative for chest pain, palpitations and leg swelling.  Gastrointestinal: Negative for abdominal distention, abdominal pain, constipation, diarrhea, nausea and vomiting.       LBM 08/27/2016  Endocrine: Negative.   Genitourinary: Negative for dysuria, flank pain, hematuria and urgency.  Musculoskeletal: Positive for gait problem.       Lower back pain under control with current regimen   Skin: Negative for color change, pallor and rash.  Neurological: Negative for dizziness, seizures, syncope, light-headedness and headaches.  Hematological: Does not bruise/bleed easily.  Psychiatric/Behavioral: Negative for agitation, confusion, hallucinations and sleep disturbance. The patient is not nervous/anxious.     Immunization History  Administered Date(s) Administered  . H1N1 03/07/2008  . Influenza Split 12/04/2010, 11/10/2011  . Influenza Whole 12/06/2007, 11/29/2008, 11/12/2009  . Influenza, High Dose Seasonal PF 12/02/2012  . Influenza,inj,Quad PF,36+ Mos 10/26/2013  . Influenza-Unspecified 11/10/2014, 12/04/2015  . Pneumococcal Conjugate-13 04/11/2013  . Pneumococcal Polysaccharide-23 06/03/2006   . Td 03/07/2008  . Zoster 11/14/2009   Pertinent  Health Maintenance Due  Topic Date Due  . OPHTHALMOLOGY EXAM  11/30/2015  . URINE MICROALBUMIN  04/09/2016  . INFLUENZA VACCINE  10/01/2016  . FOOT EXAM  01/14/2017  . HEMOGLOBIN A1C  01/30/2017  . PNA vac Low Risk Adult  Completed   Fall Risk  07/07/2016 01/15/2016 01/04/2016 10/19/2015 04/10/2015  Falls in the past year? No Yes Yes  No No  Number falls in past yr: - 1 1 - -  Injury with Fall? - No No - -  Risk for fall due to : - Impaired balance/gait;Impaired mobility - - -  Follow up - Falls prevention discussed - - -    Vitals:   08/28/16 0945  BP: 112/72  Pulse: 64  Resp: (!) 26  Temp: (!) 95.6 F (35.3 C)  SpO2: 99%  Weight: 187 lb (84.8 kg)  Height: 5' 8"  (1.727 m)   Body mass index is 28.43 kg/m. Physical Exam  Constitutional: He is oriented to person, place, and time. He appears well-developed and well-nourished. No distress.  HENT:  Head: Normocephalic.  Mouth/Throat: Oropharynx is clear and moist. No oropharyngeal exudate.  Eyes: Conjunctivae and EOM are normal. Pupils are equal, round, and reactive to light. Right eye exhibits no discharge. Left eye exhibits no discharge. No scleral icterus.  Neck: Normal range of motion. No JVD present. No thyromegaly present.  Cardiovascular: Intact distal pulses.  Exam reveals no gallop and no friction rub.   No murmur heard. HR irregular  Pulmonary/Chest: Effort normal and breath sounds normal. No respiratory distress. He has no wheezes. He has no rales.  Abdominal: Soft. Bowel sounds are normal. He exhibits no distension. There is no tenderness. There is no rebound and no guarding.  Musculoskeletal: He exhibits no edema, tenderness or deformity.  Moves x 4 extremities. Unsteady gait   Lymphadenopathy:    He has no cervical adenopathy.  Neurological: He is oriented to person, place, and time. Coordination normal.  Skin: Skin is warm and dry. No rash noted. No erythema. No  pallor.  1 cm size black-brownish in color with irregular edges  raised lesion on  Right side of the neck and left upper abdominal quadrant.  Small fleshy skin tag also noted on upper mid-back.   Psychiatric: He has a normal mood and affect.    Labs reviewed:  Recent Labs  07/22/16 0531  07/26/16 0537  07/27/16 0511 07/28/16 07/28/16 0520 08/04/16  NA 137  < > 139  < > 138 140 140 138  K 3.6  < > 3.5  < > 3.8  --  3.7 4.1  CL 99*  < > 100*  --  99*  --  99*  --   CO2 30  < > 28  --  30  --  30  --   GLUCOSE 126*  < > 112*  --  114*  --  117*  --   BUN 15  < > 22*  < > 24* 24* 24* 17  CREATININE 0.93  < > 0.77  < > 1.01 1.0 0.97 0.9  CALCIUM 9.2  < > 9.7  --  9.5  --  9.7  --   MG 1.7  --  1.7  --   --   --  1.7  --   < > = values in this interval not displayed.  Recent Labs  04/07/16 1311 07/07/16 1725 07/10/16 1546  AST 21 25 38  ALT 13 16 27   ALKPHOS 96 80 81  BILITOT 1.49* 0.9 1.8*  PROT 8.0 7.2 7.9  ALBUMIN 4.2 4.4 4.4    Recent Labs  07/10/16 1546  07/20/16 0505  07/25/16 0527  07/26/16 0537  07/27/16 0511 07/28/16 07/28/16 0520 08/04/16  WBC 15.9*  < > 12.2*  < > 11.8*  < > 9.9  < > 10.0 9.1 9.1 9.8  NEUTROABS 13.6*  --  9.7*  --  10.4*  --   --   --   --   --   --   --   HGB 14.3  < > 13.2  < > 12.7*  < > 13.0  < > 14.0  --  14.0 13.7  HCT 41.7  < > 40.6  < > 38.6*  < > 39.6  < > 44.1  --  42.5 42  MCV 92.3  < > 95.3  < > 94.8  --  94.3  --  95.2  --  95.3  --   PLT 252  < > 197  < > 216  < > 207  < > 225  --  227 241  < > = values in this interval not displayed. Lab Results  Component Value Date   TSH 1.48 04/10/2015   Lab Results  Component Value Date   HGBA1C 5.9 07/31/2016   Lab Results  Component Value Date   CHOL 102 04/10/2015   HDL 45.60 04/10/2015   LDLCALC 45 04/10/2015   TRIG 57.0 04/10/2015   CHOLHDL 2 04/10/2015    Significant Diagnostic Results in last 30 days:  No results found.  Assessment/Plan 1. Chronic diastolic CHF  (congestive heart failure) Appears compensated.Exam findings negative for edema, cough,shortness of breath or wheezing. Continue on furosemide 40 mg tablet daily.on potassium supplement. Continue to monitor weight.  2. Hypertensive heart disease with chronic diastolic congestive heart failure B/p stable.Continue on amlodipine 2.5 mg tablet.monitor BMP   3. Diabetes mellitus due to underlying condition with diabetic neuropathy, without long-term current use of insulin  CBG stable. Continue on Januvia 100 mg tablet daily.on Gabapentin 100 mg capsule three times daily for neuropathy.    4. Mixed hyperlipidemia Continue on atorvastatin 20 mg tablet. Monitor lipid panel.   5. Gastroesophageal reflux disease without esophagitis Stable.continue on omeprazole 20 mg Capsule daily.    6. Slow transit constipation LBM 08/27/2016. Continue on current regimen.   7. C hronic atrial fibrillation  irregular Heart rate.INR today was 2.3 continue on Warfarin 4 mg tablet on Mon, Wed, Fri, Sat and Sun; 5 mg Tablet on Tue and Thur.Recheck INR in 1 week. Continue to monitor for signs of bleeding.   8. Actinic Keratosis  1 cm size black-brownish in color with irregular edges  raised lesion on  Right side of the neck and left upper abdominal quadrant.reports irritation at times but no further treatment desired. Continue to monitor.    9. Skin tag  Small skin tag on upper mid-back. No further treatment desired.   Family/ staff Communication: Reviewed plan of care with patient and facility Nurse supervisor   Labs/tests ordered: None   Sandrea Hughs, NP

## 2016-09-15 DIAGNOSIS — R791 Abnormal coagulation profile: Secondary | ICD-10-CM | POA: Diagnosis not present

## 2016-09-15 DIAGNOSIS — K219 Gastro-esophageal reflux disease without esophagitis: Secondary | ICD-10-CM | POA: Diagnosis not present

## 2016-09-15 DIAGNOSIS — M6281 Muscle weakness (generalized): Secondary | ICD-10-CM | POA: Diagnosis not present

## 2016-09-15 DIAGNOSIS — I5033 Acute on chronic diastolic (congestive) heart failure: Secondary | ICD-10-CM | POA: Diagnosis not present

## 2016-09-15 DIAGNOSIS — D649 Anemia, unspecified: Secondary | ICD-10-CM | POA: Diagnosis not present

## 2016-09-15 DIAGNOSIS — R9430 Abnormal result of cardiovascular function study, unspecified: Secondary | ICD-10-CM | POA: Diagnosis not present

## 2016-09-15 DIAGNOSIS — N390 Urinary tract infection, site not specified: Secondary | ICD-10-CM | POA: Diagnosis not present

## 2016-09-15 DIAGNOSIS — D6832 Hemorrhagic disorder due to extrinsic circulating anticoagulants: Secondary | ICD-10-CM | POA: Diagnosis not present

## 2016-09-15 DIAGNOSIS — R296 Repeated falls: Secondary | ICD-10-CM | POA: Diagnosis not present

## 2016-09-15 DIAGNOSIS — S32020S Wedge compression fracture of second lumbar vertebra, sequela: Secondary | ICD-10-CM | POA: Diagnosis not present

## 2016-09-15 DIAGNOSIS — C61 Malignant neoplasm of prostate: Secondary | ICD-10-CM | POA: Diagnosis not present

## 2016-09-15 DIAGNOSIS — Z7901 Long term (current) use of anticoagulants: Secondary | ICD-10-CM | POA: Diagnosis not present

## 2016-09-15 DIAGNOSIS — I350 Nonrheumatic aortic (valve) stenosis: Secondary | ICD-10-CM | POA: Diagnosis not present

## 2016-09-15 DIAGNOSIS — E1140 Type 2 diabetes mellitus with diabetic neuropathy, unspecified: Secondary | ICD-10-CM | POA: Diagnosis not present

## 2016-09-15 DIAGNOSIS — I10 Essential (primary) hypertension: Secondary | ICD-10-CM | POA: Diagnosis not present

## 2016-09-15 DIAGNOSIS — R29898 Other symptoms and signs involving the musculoskeletal system: Secondary | ICD-10-CM | POA: Diagnosis not present

## 2016-09-15 DIAGNOSIS — Z5181 Encounter for therapeutic drug level monitoring: Secondary | ICD-10-CM | POA: Diagnosis not present

## 2016-09-15 DIAGNOSIS — E785 Hyperlipidemia, unspecified: Secondary | ICD-10-CM | POA: Diagnosis not present

## 2016-09-15 DIAGNOSIS — M549 Dorsalgia, unspecified: Secondary | ICD-10-CM | POA: Diagnosis not present

## 2016-09-15 DIAGNOSIS — I340 Nonrheumatic mitral (valve) insufficiency: Secondary | ICD-10-CM | POA: Diagnosis not present

## 2016-09-15 DIAGNOSIS — R609 Edema, unspecified: Secondary | ICD-10-CM | POA: Diagnosis not present

## 2016-09-15 DIAGNOSIS — I482 Chronic atrial fibrillation: Secondary | ICD-10-CM | POA: Diagnosis not present

## 2016-09-15 DIAGNOSIS — K5731 Diverticulosis of large intestine without perforation or abscess with bleeding: Secondary | ICD-10-CM | POA: Diagnosis not present

## 2016-09-15 DIAGNOSIS — M545 Low back pain: Secondary | ICD-10-CM | POA: Diagnosis not present

## 2016-09-15 DIAGNOSIS — D62 Acute posthemorrhagic anemia: Secondary | ICD-10-CM | POA: Diagnosis not present

## 2016-09-22 ENCOUNTER — Encounter: Payer: Self-pay | Admitting: Internal Medicine

## 2016-09-22 ENCOUNTER — Non-Acute Institutional Stay (SKILLED_NURSING_FACILITY): Payer: Medicare Other | Admitting: Internal Medicine

## 2016-09-22 DIAGNOSIS — I10 Essential (primary) hypertension: Secondary | ICD-10-CM | POA: Diagnosis not present

## 2016-09-22 DIAGNOSIS — R3981 Functional urinary incontinence: Secondary | ICD-10-CM

## 2016-09-22 DIAGNOSIS — K219 Gastro-esophageal reflux disease without esophagitis: Secondary | ICD-10-CM

## 2016-09-22 DIAGNOSIS — D649 Anemia, unspecified: Secondary | ICD-10-CM

## 2016-09-22 DIAGNOSIS — E782 Mixed hyperlipidemia: Secondary | ICD-10-CM | POA: Diagnosis not present

## 2016-09-22 DIAGNOSIS — I482 Chronic atrial fibrillation, unspecified: Secondary | ICD-10-CM

## 2016-09-22 NOTE — Progress Notes (Signed)
Location:  [facility] Room Number: 24 Place of Service:  SNF [phone]) Provider:  Blanchie Serve MD  Blanchie Serve, MD  Patient Care Team: Blanchie Serve, MD as PCP - General (Internal Medicine) Carlena Bjornstad, MD (Cardiology) Earnie Larsson, MD (Neurosurgery) Rutherford Guys, MD (Ophthalmology) Renda Rolls, Jennefer Bravo, MD as Referring Physician (Dermatology) Alexis Frock, MD as Consulting Physician (Urology) Dorothy Spark, MD as Consulting Physician (Cardiology)  Extended Emergency Contact Information Primary Emergency Contact: [place], [place] of [place] Phone: [phone] Mobile Phone: [phone] Relation: Friend Secondary Emergency Contact: Ervin Knack of [place] Phone: [phone] Relation: Friend  Code Status:  DNR Goals of care: Advanced Directive information Advanced Directives 08/28/2016  Does Patient Have a Medical Advance Directive? Yes  Type of Advance Directive Out of facility DNR (pink MOST or yellow form);Living will;Healthcare Power of Attorney  Does patient want to make changes to medical advance directive? -  Copy of [place] in Chart? Yes  Would patient like information on creating a medical advance directive? -  Pre-existing out of facility DNR order (yellow form or pink MOST form) Yellow form placed in chart (order not valid for inpatient use)     Chief Complaint  Patient presents with  . Medical Management of Chronic Issues    Routine Visit     HPI:  Pt is a 83 y.o. male seen today for medical management of chronic diseases.  He denies any concern this visit. No new concern from nursing.    Past Medical History:  Diagnosis Date  . ACE-inhibitor cough   . Aortic dissection [facility])    Surgical repair February, 2014  . Aortic stenosis    mild....echo... september... 2010/ mild... echo.[name] KitchenMarland KitchenDec, 2011  . Atrial fibrillation (HCC)     Chronic,    24 hour holter, September, 2011.... atrial fib rate is controlled.... there is some bradycardia but  no marked pauses  . Bladder cancer [facility])    recurrence with TUR-B March '09  . DDD (degenerative disc disease)    lumbar spine  . Diabetes mellitus    type 2  . Diabetes mellitus with neuropathy ([place]) 12/04/2006   Qualifier: Diagnosis of  By: Linda Hedges MD, Heinz Knuckles  medications - DPP4   . Diverticulosis of colon with hemorrhage 01/01/2014  . Ejection fraction    EF 60%, echo, 01/2010  . Fluid overload   . Fracture of metacarpal bone 11/30/11  . GERD (gastroesophageal reflux disease)   . Gout   . History of echocardiogram    Echo 5/17 - mild LVH, EF 55-60%, mod AS (mean 16 mmHg, peak 29 mmHg), MAC, midl MR, massive BAE, mod dilated RV, low normal RVSF, mod TR, PASP 64 mmHg  . Hx of colonoscopy    approx. 10 years  . Hypercholesterolemia   . Hyperlipidemia   . Hypertension   . Mitral regurgitation   . Normal nuclear stress test    06/2005 , also ABI normal 2007  . Prostate cancer ([place])    radiation tx.  . Pulmonary hypertension ([place])    8mHg, echo, 01/2010  . Shortness of breath    on exertion  . Warfarin anticoagulation    Atrial fib  . Whooping cough    Past Surgical History:  Procedure Laterality Date  . ASCENDING AORTIC ROOT REPLACEMENT N/A 04/15/2012   Procedure: ASCENDING AORTIC ROOT REPLACEMENT;  Surgeon: BGaye Pollack  MD;  Location: [facility] OR;  Service: Open Heart Surgery;  Laterality: N/A;  . bladder cancer biopsy  10/16/2010   negative for malignancy  . bladder microscopic  04/13/2007   high grade papillary urothelial lesions  . COLONOSCOPY N/A 01/01/2014   Procedure: COLONOSCOPY;  Surgeon: Gatha Mayer, MD;  Location: [facility] ENDOSCOPY;  Service: Endoscopy;  Laterality: N/A;  . CYSTOSCOPY/RETROGRADE/URETEROSCOPY Bilateral 04/04/2013   Procedure: CYSTOSCOPY WITH RETROGRADE PYELOGRAM AND BLADDER BIOPSY;  Surgeon: Molli Hazard, MD;  Location: [facility] ORS;  Service: Urology;   Laterality: Bilateral;  . 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 EXPLORATION POST OPERATIVE OPEN HEART  04/2012  . Herniatic disc surgery    . IR KYPHO LUMBAR INC FX REDUCE BONE BX UNI/BIL CANNULATION INC/IMAGING  07/25/2016  . LUMBAR LAMINECTOMY     '02  . PROSTATE BIOPSY  09/25/2011   GLEASON 3+3=6 AND 4+3=7  . TEE WITHOUT CARDIOVERSION N/A 04/15/2012   Procedure: TRANSESOPHAGEAL ECHOCARDIOGRAM (TEE);  Surgeon: Gaye Pollack, MD;  Location: [facility];  Service: Open Heart Surgery;  Laterality: N/A;  . TRANSURETHRAL RESECTION OF BLADDER TUMOR N/A 06/13/2015   Procedure: TRANSURETHRAL RESECTION OF PROSTATE (TURP) CLOT EVACUATION AND FULGERATION, BLADDER STONE REMOVAL ;  Surgeon: Alexis Frock, MD;  Location: [facility] ORS;  Service: Urology;  Laterality: N/A;  . TUR-BT '06, '09      Allergies  Allergen Reactions  . Metoprolol Other (See Comments)    Reaction:  Bradycardia with 13m BID dose    Outpatient Encounter Prescriptions as of 09/22/2016  Medication Sig  . acetaminophen (TYLENOL) 500 MG tablet Take 500 mg by mouth every 8 (eight) hours as needed for mild pain, moderate pain, fever or headache.   . allopurinol (ZYLOPRIM) 300 MG tablet take 1 tablet by mouth once daily  . amLODipine (NORVASC) 2.5 MG tablet take 1 tablet by mouth once daily  . atorvastatin (LIPITOR) 20 MG tablet take 1 tablet by mouth once daily  . Calcium Carbonate-Vitamin D (CALCIUM 600+D) 600-400 MG-UNIT tablet Take 1 tablet by mouth at bedtime.  .[name] Kitchendesonide (DESOWEN) 0.05 % lotion Apply 1 application topically daily as needed (rosacea).   . docusate sodium (COLACE) 100 MG capsule Take 1 capsule (100 mg total) by mouth every 12 (twelve) hours.  . ferrous sulfate 325 (65 FE) MG tablet Take 650 mg by mouth daily with breakfast.  . finasteride (PROSCAR) 5 MG tablet Take 1 tablet (5 mg total) by mouth every morning.  . furosemide (LASIX) 40 MG tablet Take 1 tablet (40 mg total) by mouth  daily.  .[name] Kitchengabapentin (NEURONTIN) 100 MG capsule Take 1 capsule (100 mg total) by mouth 3 (three) times daily.  .[name] KitchenJANUVIA 100 MG tablet take 1 tablet by mouth once daily  . ketoconazole (NIZORAL) 2 % shampoo Apply 1 application topically as needed.   . mirabegron ER (MYRBETRIQ) 50 MG TB24 tablet Take 50 mg by mouth daily.  . Multiple Vitamin (THERAPEUTIC MULTIVITAMIN PO) Take 1 tablet by mouth daily.  .[name] Kitchenomeprazole (PRILOSEC) 20 MG capsule Take 20 mg by mouth daily.  .[name] KitchenoxyCODONE (ROXICODONE) 5 MG immediate release tablet Take 1 tablet (5 mg total) by mouth every 6 (six) hours as needed for severe pain.  . polyethylene glycol (MIRALAX / GLYCOLAX) packet Take 17 g by mouth daily as needed.  . potassium chloride (K-DUR,KLOR-CON) 10 MEQ tablet take 1 tablet by mouth once daily  . senna (SENOKOT) 8.6 MG tablet Take 1  tablet by mouth at bedtime.   . Tamsulosin HCl (FLOMAX) 0.4 MG CAPS Take 0.4 mg by mouth at bedtime.   [name] Kitchen tiZANidine (ZANAFLEX) 4 MG tablet Take 1 tablet (4 mg total) by mouth every 6 (six) hours as needed for muscle spasms.  . traMADol (ULTRAM) 50 MG tablet Take 1 tablet (50 mg total) by mouth every 6 (six) hours as needed for moderate pain.  . traZODone (DESYREL) 50 MG tablet Take 50 mg by mouth at bedtime.  [name] Kitchen warfarin (COUMADIN) 4 MG tablet Take 4 mg by mouth daily. Sunday, Monday, Wednesday, Friday and Saturday  . warfarin (COUMADIN) 5 MG tablet Take 5 mg by mouth daily. Tuesday and Thursday  . [DISCONTINUED] warfarin (COUMADIN) 5 MG tablet Take 1 tablet (5 mg total) by mouth daily at 6 PM.   No facility-administered encounter medications on file as of 09/22/2016.     Review of Systems  Constitutional: Negative for activity change, appetite change, chills, diaphoresis and fever.  HENT: Negative for congestion, hearing loss, mouth sores, postnasal drip, rhinorrhea, sinus pain, sore throat and trouble swallowing.   Eyes: Positive for visual disturbance. Negative for pain and itching.         Wears corrective lenses  Respiratory: Negative for cough, shortness of breath and wheezing.   Cardiovascular: Negative for chest pain and palpitations.  Gastrointestinal: Negative for abdominal pain, blood in stool, diarrhea, nausea and vomiting.  Genitourinary: Negative for dysuria and flank pain.  Musculoskeletal: Positive for arthralgias, back pain and gait problem.       Unsteady gait  Skin: Negative for pallor, rash and wound.  Neurological: Negative for dizziness, seizures, numbness and headaches.  Hematological: Bruises/bleeds easily.  Psychiatric/Behavioral: Negative for behavioral problems.    Immunization History  Administered Date(s) Administered  . H1N1 03/07/2008  . Influenza Split 12/04/2010, 11/10/2011  . Influenza Whole 12/06/2007, 11/29/2008, 11/12/2009  . Influenza, High Dose Seasonal PF 12/02/2012  . Influenza,inj,Quad PF,36+ Mos 10/26/2013  . Influenza-Unspecified 11/10/2014, 12/04/2015  . Pneumococcal Conjugate-13 04/11/2013  . Pneumococcal Polysaccharide-23 06/03/2006  . Td 03/07/2008  . Zoster 11/14/2009   Pertinent  Health Maintenance Due  Topic Date Due  . OPHTHALMOLOGY EXAM  03/03/2017 (Originally 11/30/2015)  . URINE MICROALBUMIN  03/03/2017 (Originally 04/09/2016)  . INFLUENZA VACCINE  10/01/2016  . FOOT EXAM  01/14/2017  . HEMOGLOBIN A1C  01/30/2017  . PNA vac Low Risk Adult  Completed   Fall Risk  07/07/2016 01/15/2016 01/04/2016 10/19/2015 04/10/2015  Falls in the past year? No Yes Yes No No  Number falls in past yr: - 1 1 - -  Injury with Fall? - No No - -  Risk for fall due to : - Impaired balance/gait;Impaired mobility - - -  Follow up - Falls prevention discussed - - -   Functional Status Survey:    Vitals:   09/22/16 1125  BP: (!) 91/54  Pulse: 61  Resp: 16  Temp: (!) 97.2 F (36.2 C)  TempSrc: Oral  SpO2: 94%  Weight: 186 lb (84.4 kg)  Height: _0  (1.727 m)   Body mass index is 28.28 kg/m. Physical Exam  Constitutional:  He is oriented to person, place, and time. He appears well-developed and well-nourished. No distress.  HENT:  Head: Normocephalic and atraumatic.  Mouth/Throat: Oropharynx is clear and moist.  Eyes: Pupils are equal, round, and reactive to light.  Wears corrective glasses  Neck: Normal range of motion. Neck supple. No thyromegaly present.  Cardiovascular:  Irregular HR, systolic murmur  present  Pulmonary/Chest: Effort normal. No respiratory distress. He has no wheezes. He has no rales.  Abdominal: Soft. Bowel sounds are normal. He exhibits no distension. There is no tenderness.  Musculoskeletal:  Can move all 4 extremities, trace leg edema, uses a walker to get around, unsteady gait, 1 person minimum assist with transfer  Lymphadenopathy:    He has no cervical adenopathy.  Neurological: He is alert and oriented to person, place, and time.  Skin: Skin is warm and dry. He is not diaphoretic.  Psychiatric: He has a normal mood and affect. His behavior is normal.    Labs reviewed:  Recent Labs  07/22/16 0531  07/26/16 0537  07/27/16 0511 07/28/16 07/28/16 0520 08/04/16  NA 137  < > 139  < > 138 140 140 138  K 3.6  < > 3.5  < > 3.8  --  3.7 4.1  CL 99*  < > 100*  --  99*  --  99*  --   CO2 30  < > 28  --  30  --  30  --   GLUCOSE 126*  < > 112*  --  114*  --  117*  --   BUN 15  < > 22*  < > 24* 24* 24* 17  CREATININE 0.93  < > 0.77  < > 1.01 1.0 0.97 0.9  CALCIUM 9.2  < > 9.7  --  9.5  --  9.7  --   MG 1.7  --  1.7  --   --   --  1.7  --   < > = values in this interval not displayed.  Recent Labs  04/07/16 1311 07/07/16 1725 07/10/16 1546  AST 21 25 38  ALT _0 ALKPHOS 96 80 81  BILITOT 1.49* 0.9 1.8*  PROT 8.0 7.2 7.9  ALBUMIN 4.2 4.4 4.4    Recent Labs  07/10/16 1546  07/20/16 0505  07/25/16 0527  07/26/16 0537  07/27/16 0511 07/28/16 07/28/16 0520 08/04/16  WBC 15.9*  < > 12.2*  < > 11.8*  < > 9.9  < > 10.0 9.1 9.1 9.8  NEUTROABS 13.6*  --  9.7*  --   10.4*  --   --   --   --   --   --   --   HGB 14.3  < > 13.2  < > 12.7*  < > 13.0  < > 14.0  --  14.0 13.7  HCT 41.7  < > 40.6  < > 38.6*  < > 39.6  < > 44.1  --  42.5 42  MCV 92.3  < > 95.3  < > 94.8  --  94.3  --  95.2  --  95.3  --   PLT 252  < > 197  < > 216  < > 207  < > 225  --  227 241  < > = values in this interval not displayed. Lab Results  Component Value Date   TSH 1.48 04/10/2015   Lab Results  Component Value Date   HGBA1C 5.9 07/31/2016   Lab Results  Component Value Date   CHOL 102 04/10/2015   HDL 45.60 04/10/2015   LDLCALC 45 04/10/2015   TRIG 57.0 04/10/2015   CHOLHDL 2 04/10/2015    Significant Diagnostic Results in last 30 days:  No results found.  Assessment/Plan  Anemia chronic Stable H&h. Currently on feso4 325 mg daily. Discontinue this and monitor clinically. Check  cbc in 2 months  Hypertension Few low BP reading on review. Currently on amlodipine 2.5 mg daily and lasix 40 mg daily. D/c amlodipine. Check BP bid x 2 weeks. To avoid further hypotensive episode and fall, change lasix to 20 mg bid with holding parameters and monitor.   Urinary incontinence Continue mirabegron and monitor  afib Controlled HR. Continue warfarin for stroke prophylaxis  gerd Overall stable with occasional symptom. Continue omeprazole for now with history of gi bleed.   Hyperlipidemia Lipid Panel     Component Value Date/Time   CHOL 102 04/10/2015 1230   TRIG 57.0 04/10/2015 1230   HDL 45.60 04/10/2015 1230   CHOLHDL 2 04/10/2015 1230   VLDL 11.4 04/10/2015 1230   LDLCALC 45 04/10/2015 1230  check lipid panel in august 2018. Continue atorvastatin 20 mg daily for now    Family/ staff Communication: reviewed care plan with patient and charge nurse.    Labs/tests ordered:  Lipid panel, BMP   Blanchie Serve, MD Internal Medicine [facility] [address] [place],  [address] Cell Phone (Monday-Friday 8 am - 5 pm):  [phone] On Call: [phone] and follow prompts after 5 pm and on weekends Office Phone: [phone] Office Fax: [phone]

## 2016-09-24 ENCOUNTER — Ambulatory Visit: Payer: Self-pay | Admitting: General Practice

## 2016-10-06 DIAGNOSIS — E784 Other hyperlipidemia: Secondary | ICD-10-CM | POA: Diagnosis not present

## 2016-10-06 DIAGNOSIS — I10 Essential (primary) hypertension: Secondary | ICD-10-CM | POA: Diagnosis not present

## 2016-10-06 DIAGNOSIS — M6281 Muscle weakness (generalized): Secondary | ICD-10-CM | POA: Diagnosis not present

## 2016-10-06 DIAGNOSIS — I279 Pulmonary heart disease, unspecified: Secondary | ICD-10-CM | POA: Diagnosis not present

## 2016-10-06 DIAGNOSIS — Z79899 Other long term (current) drug therapy: Secondary | ICD-10-CM | POA: Diagnosis not present

## 2016-10-06 DIAGNOSIS — E785 Hyperlipidemia, unspecified: Secondary | ICD-10-CM | POA: Diagnosis not present

## 2016-10-06 LAB — BASIC METABOLIC PANEL
BUN: 13 (ref 4–21)
Creatinine: 1.2 (ref 0.6–1.3)
GLUCOSE: 93
Potassium: 3.7 (ref 3.4–5.3)
SODIUM: 141 (ref 137–147)

## 2016-10-06 LAB — LIPID PANEL
Cholesterol: 91 (ref 0–200)
HDL: 30 — AB (ref 35–70)
LDL CALC: 42
Triglycerides: 107 (ref 40–160)

## 2016-10-14 ENCOUNTER — Non-Acute Institutional Stay (SKILLED_NURSING_FACILITY): Payer: Medicare Other | Admitting: Family

## 2016-10-14 ENCOUNTER — Encounter: Payer: Self-pay | Admitting: Family

## 2016-10-14 DIAGNOSIS — G8929 Other chronic pain: Secondary | ICD-10-CM | POA: Diagnosis not present

## 2016-10-14 DIAGNOSIS — M5440 Lumbago with sciatica, unspecified side: Secondary | ICD-10-CM | POA: Diagnosis not present

## 2016-10-14 MED ORDER — OXYCODONE HCL 5 MG PO TABS: 5.0000 mg | ORAL_TABLET | Freq: Three times a day (TID) | ORAL | 0 refills | Status: DC | PRN

## 2016-10-14 NOTE — Progress Notes (Signed)
Location:  [place] Room Number: 24 Place of Service:  SNF [phone]) Provider: Rudean Icenhour FNP-C  Biagio Borg, MD  Patient Care Team: Biagio Borg, MD as PCP - General (Internal Medicine) Carlena Bjornstad, MD (Cardiology) Earnie Larsson, MD (Neurosurgery) Rutherford Guys, MD (Ophthalmology) Renda Rolls, Jennefer Bravo, MD as Referring Physician (Dermatology) Alexis Frock, MD as Consulting Physician (Urology) Dorothy Spark, MD as Consulting Physician (Cardiology)  Extended Emergency Contact Information Primary Emergency Contact: [place], [place] Phone: [phone] Mobile Phone: [phone] Relation: Friend Secondary Emergency Contact: Ervin Knack of [place] Phone: [phone] Relation: Friend  Code Status:  DNR Goals of care: Advanced Directive information Advanced Directives 10/14/2016  Does Patient Have a Medical Advance Directive? Yes  Type of Advance Directive Out of facility DNR (pink MOST or yellow form);Living will;Healthcare Power of Attorney  Does patient want to make changes to medical advance directive? -  Copy of [place] in Chart? Yes  Would patient like information on creating a medical advance directive? -  Pre-existing out of facility DNR order (yellow form or pink MOST form) Yellow form placed in chart (order not valid for inpatient use)     Chief Complaint  Patient presents with  . Acute Visit    Medication management    HPI:  Pt is a 83 y.o. male seen today at [facility] for an acute visit for medication management. He is seen in his room today lying in bed. He has a significant medical history of HTN, CHF,Afib,type 2 DM with Neuropathy, chronic back pain, OA among other conditions.facilitity Nurse reports patient rates lower back pain 1 or 2 on scale but request to take oxycodone then counts time to see when he can have oxycodone  again.He rates pain 1-2 on scale especially when " I stand at the sink to shave" otherwise pain under control. He denies any other acute issues this visit. Discussed with patient to request for tylenol or tramadol for mild to moderate pain level and Oxycodone only for severe pain level. Patient verbalized understanding.      Past Medical History:  Diagnosis Date  . ACE-inhibitor cough   . Aortic dissection [facility])    Surgical repair February, 2014  . Aortic stenosis    mild....echo... september... 2010/ mild... echo.[name] KitchenMarland KitchenDec, 2011  . Atrial fibrillation (HCC)     Chronic,   24 hour holter, September, 2011.... atrial fib rate is controlled.... there is some bradycardia but  no marked pauses  . Bladder cancer [facility])    recurrence with TUR-B March '09  . DDD (degenerative disc disease)    lumbar spine  . Diabetes mellitus    type 2  . Diabetes mellitus with neuropathy ([place]) 12/04/2006   Qualifier: Diagnosis of  By: Linda Hedges MD, Heinz Knuckles  medications - DPP4   . Diverticulosis of colon with hemorrhage 01/01/2014  . Ejection fraction    EF 60%, echo, 01/2010  . Fluid overload   . Fracture of metacarpal bone 11/30/11  . GERD (gastroesophageal reflux disease)   . Gout   . History of echocardiogram    Echo 5/17 - mild LVH, EF 55-60%, mod AS (mean 16 mmHg, peak 29 mmHg), MAC, midl MR, massive BAE, mod dilated RV, low normal RVSF, mod TR, PASP 64 mmHg  . Hx of colonoscopy    approx. 10 years  . Hypercholesterolemia   .  Hyperlipidemia   . Hypertension   . Mitral regurgitation   . Normal nuclear stress test    06/2005 , also ABI normal 2007  . Prostate cancer ([place])    radiation tx.  . Pulmonary hypertension ([place])    52mHg, echo, 01/2010  . Shortness of breath    on exertion  . Warfarin anticoagulation    Atrial fib  . Whooping cough    Past Surgical History:  Procedure Laterality Date  . ASCENDING AORTIC ROOT REPLACEMENT N/A 04/15/2012   Procedure: ASCENDING AORTIC ROOT REPLACEMENT;   Surgeon: BGaye Pollack MD;  [place] [facility]  Service: Open Heart Surgery;  Laterality: N/A;  . bladder cancer biopsy  10/16/2010   negative for malignancy  . bladder microscopic  04/13/2007   high grade papillary urothelial lesions  . COLONOSCOPY N/A 01/01/2014   Procedure: COLONOSCOPY;  Surgeon: CGatha Mayer MD;  Location: [facility];  Service: Endoscopy;  Laterality: N/A;  . CYSTOSCOPY/RETROGRADE/URETEROSCOPY Bilateral 04/04/2013   Procedure: CYSTOSCOPY WITH RETROGRADE PYELOGRAM AND BLADDER BIOPSY;  Surgeon: DMolli Hazard MD;  Location: [facility];  Service: Urology;  Laterality: Bilateral;  . 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 EXPLORATION POST OPERATIVE OPEN HEART  04/2012  . Herniatic disc surgery    . IR KYPHO LUMBAR INC FX REDUCE BONE BX UNI/BIL CANNULATION INC/IMAGING  07/25/2016  . LUMBAR LAMINECTOMY     '02  . PROSTATE BIOPSY  09/25/2011   GLEASON 3+3=6 AND 4+3=7  . TEE WITHOUT CARDIOVERSION N/A 04/15/2012   Procedure: TRANSESOPHAGEAL ECHOCARDIOGRAM (TEE);  Surgeon: BGaye Pollack MD;  [facility]  Service: Open Heart Surgery;  Laterality: N/A;  . TRANSURETHRAL RESECTION OF BLADDER TUMOR N/A 06/13/2015   Procedure: TRANSURETHRAL RESECTION OF PROSTATE (TURP) CLOT EVACUATION AND FULGERATION, BLADDER STONE REMOVAL ;  Surgeon: TAlexis Frock MD;  Location: [facility];  Service: Urology;  Laterality: N/A;  . TUR-BT '06, '09      Allergies  Allergen Reactions  . Metoprolol Other (See Comments)    Reaction:  Bradycardia with 28mBID dose    Allergies as of 10/14/2016      Reactions   Metoprolol Other (See Comments)   Reaction:  Bradycardia with 2529mID dose      Medication List       Accurate as of 10/14/16  3:30 PM. Always use your most recent med list.          acetaminophen 500 MG tablet Commonly known as:  TYLENOL Take 500 mg by mouth every 8 (eight) hours as needed for mild pain, moderate pain, fever or headache.     allopurinol 300 MG tablet Commonly known as:  ZYLOPRIM take 1 tablet by mouth once daily   atorvastatin 20 MG tablet Commonly known as:  LIPITOR take 1 tablet by mouth once daily   CALCIUM 600+D 600-400 MG-UNIT tablet Generic drug:  Calcium Carbonate-Vitamin D Take 1 tablet by mouth at bedtime.   desonide 0.05 % lotion Commonly known as:  DESOWEN Apply 1 application topically daily as needed (rosacea).   docusate sodium 100 MG capsule Commonly known as:  COLACE Take 1 capsule (100 mg total) by mouth every 12 (twelve) hours.   finasteride 5 MG tablet Commonly known as:  PROSCAR Take 1 tablet (5 mg total) by mouth every morning.   furosemide 20 MG tablet Commonly known as:  LASIX Take 20 mg by mouth 2 (two) times daily. Hold for SBP <110  gabapentin 100 MG capsule Commonly known as:  NEURONTIN Take 1 capsule (100 mg total) by mouth 3 (three) times daily.   JANUVIA 100 MG tablet Generic drug:  sitaGLIPtin take 1 tablet by mouth once daily   ketoconazole 2 % shampoo Commonly known as:  NIZORAL Apply 1 application topically as needed.   MYRBETRIQ 50 MG Tb24 tablet Generic drug:  mirabegron ER Take 50 mg by mouth daily.   omeprazole 20 MG capsule Commonly known as:  PRILOSEC Take 20 mg by mouth daily.   oxyCODONE 5 MG immediate release tablet Commonly known as:  ROXICODONE Take 1 tablet (5 mg total) by mouth every 8 (eight) hours as needed for severe pain.   polyethylene glycol packet Commonly known as:  MIRALAX / GLYCOLAX Take 17 g by mouth daily as needed.   potassium chloride 10 MEQ tablet Commonly known as:  K-DUR,KLOR-CON take 1 tablet by mouth once daily   senna 8.6 MG tablet Commonly known as:  SENOKOT Take 1 tablet by mouth at bedtime.   tamsulosin 0.4 MG Caps capsule Commonly known as:  FLOMAX Take 0.4 mg by mouth at bedtime.   THERAPEUTIC MULTIVITAMIN PO Take 1 tablet by mouth daily.   tiZANidine 4 MG tablet Commonly known as:   ZANAFLEX Take 1 tablet (4 mg total) by mouth every 6 (six) hours as needed for muscle spasms.   traMADol 50 MG tablet Commonly known as:  ULTRAM Take 1 tablet (50 mg total) by mouth every 6 (six) hours as needed for moderate pain.   traZODone 50 MG tablet Commonly known as:  DESYREL Take 50 mg by mouth at bedtime.   warfarin 4 MG tablet Commonly known as:  COUMADIN Take 4 mg by mouth daily. Sunday, Monday, Wednesday, Friday and Saturday   warfarin 5 MG tablet Commonly known as:  COUMADIN Take 5 mg by mouth daily. Tuesday and Thursday       Review of Systems  Constitutional: Negative for activity change, appetite change, chills, fatigue and fever.  Respiratory: Negative for cough, chest tightness, shortness of breath and wheezing.   Cardiovascular: Negative for chest pain, palpitations and leg swelling.  Gastrointestinal: Negative for abdominal distention, abdominal pain, constipation, diarrhea, nausea and vomiting.  Genitourinary: Negative for dysuria, flank pain, hematuria and urgency.  Musculoskeletal: Positive for back pain and gait problem.       Lower back pain 1-2 on scale.   Skin: Negative for color change, pallor and rash.  Neurological:       Numbness and tingling to lower extremities   Psychiatric/Behavioral: Negative for agitation, confusion, hallucinations and sleep disturbance. The patient is not nervous/anxious.     Immunization History  Administered Date(s) Administered  . H1N1 03/07/2008  . Influenza Split 12/04/2010, 11/10/2011  . Influenza Whole 12/06/2007, 11/29/2008, 11/12/2009  . Influenza, High Dose Seasonal PF 12/02/2012  . Influenza,inj,Quad PF,36+ Mos 10/26/2013  . Influenza-Unspecified 11/10/2014, 12/04/2015  . Pneumococcal Conjugate-13 04/11/2013  . Pneumococcal Polysaccharide-23 06/03/2006  . Td 03/07/2008  . Zoster 11/14/2009   Pertinent  Health Maintenance Due  Topic Date Due  . INFLUENZA VACCINE  10/01/2016  . OPHTHALMOLOGY EXAM   03/03/2017 (Originally 11/30/2015)  . URINE MICROALBUMIN  03/03/2017 (Originally 04/09/2016)  . FOOT EXAM  01/14/2017  . HEMOGLOBIN A1C  01/30/2017  . PNA vac Low Risk Adult  Completed   Fall Risk  07/07/2016 01/15/2016 01/04/2016 10/19/2015 04/10/2015  Falls in the past year? No Yes Yes No No  Number falls in past yr: -  1 1 - -  Comment - - - - -  Injury with Fall? - No No - -  Comment - - - - -  Risk for fall due to : - Impaired balance/gait;Impaired mobility - - -  Follow up - Falls prevention discussed - - -    Vitals:   10/14/16 1057  BP: 140/80  Pulse: (!) 56  Resp: 12  Temp: (!) 96.2 F (35.7 C)  SpO2: 96%  Weight: 184 lb (83.5 kg)  Height: _0  (1.727 m)   Body mass index is 27.98 kg/m. Physical Exam  Constitutional: He is oriented to person, place, and time. He appears well-developed and well-nourished.  Elderly in no acute distress  HENT:  Head: Normocephalic.  Mouth/Throat: Oropharynx is clear and moist. No oropharyngeal exudate.  Eyes: Pupils are equal, round, and reactive to light. Conjunctivae and EOM are normal. Right eye exhibits no discharge. Left eye exhibits no discharge. No scleral icterus.  Neck: Normal range of motion. No JVD present. No thyromegaly present.  Cardiovascular: Intact distal pulses.  Exam reveals no gallop and no friction rub.   No murmur heard. HR irregular  Pulmonary/Chest: Effort normal and breath sounds normal. No respiratory distress. He has no wheezes. He has no rales.  Abdominal: Soft. Bowel sounds are normal. He exhibits no distension. There is no tenderness. There is no rebound and no guarding.  Musculoskeletal: He exhibits no edema, tenderness or deformity.  Moves x 4 extremities.Unsteady gait ambulates with FWW  Lymphadenopathy:    He has no cervical adenopathy.  Neurological: He is oriented to person, place, and time. Coordination normal.  Skin: Skin is warm and dry. No rash noted. No erythema. No pallor.  Psychiatric: He has a  normal mood and affect.    Labs reviewed:  Recent Labs  07/22/16 0531  07/26/16 0537  07/27/16 0511 07/28/16 07/28/16 0520 08/04/16  NA 137  < > 139  < > 138 140 140 138  K 3.6  < > 3.5  < > 3.8  --  3.7 4.1  CL 99*  < > 100*  --  99*  --  99*  --   CO2 30  < > 28  --  30  --  30  --   GLUCOSE 126*  < > 112*  --  114*  --  117*  --   BUN 15  < > 22*  < > 24* 24* 24* 17  CREATININE 0.93  < > 0.77  < > 1.01 1.0 0.97 0.9  CALCIUM 9.2  < > 9.7  --  9.5  --  9.7  --   MG 1.7  --  1.7  --   --   --  1.7  --   < > = values in this interval not displayed.  Recent Labs  04/07/16 1311 07/07/16 1725 07/10/16 1546  AST 21 25 38  ALT _1 ALKPHOS 96 80 81  BILITOT 1.49* 0.9 1.8*  PROT 8.0 7.2 7.9  ALBUMIN 4.2 4.4 4.4    Recent Labs  07/10/16 1546  07/20/16 0505  07/25/16 0527  07/26/16 0537  07/27/16 0511 07/28/16 07/28/16 0520 08/04/16  WBC 15.9*  < > 12.2*  < > 11.8*  < > 9.9  < > 10.0 9.1 9.1 9.8  NEUTROABS 13.6*  --  9.7*  --  10.4*  --   --   --   --   --   --   --  HGB 14.3  < > 13.2  < > 12.7*  < > 13.0  < > 14.0  --  14.0 13.7  HCT 41.7  < > 40.6  < > 38.6*  < > 39.6  < > 44.1  --  42.5 42  MCV 92.3  < > 95.3  < > 94.8  --  94.3  --  95.2  --  95.3  --   PLT 252  < > 197  < > 216  < > 207  < > 225  --  227 241  < > = values in this interval not displayed. Lab Results  Component Value Date   TSH 1.48 04/10/2015   Lab Results  Component Value Date   HGBA1C 5.9 07/31/2016   Lab Results  Component Value Date   CHOL 102 04/10/2015   HDL 45.60 04/10/2015   LDLCALC 45 04/10/2015   TRIG 57.0 04/10/2015   CHOLHDL 2 04/10/2015    Significant Diagnostic Results in last 30 days:  No results found.  Assessment/Plan  Chronic bilateral low back pain with sciatica Rates pain level 1-2 on scale.Nurse reports request to take oxycodone then counts time to see when he can have oxycodone again.Discussed with patient to request for tylenol or tramadol for mild to  moderate pain level and Oxycodone only for severe pain level. Patient verbalized understanding.change oxycodone 5 mg tablet to one by mouth every 8 hours as needed.continue on Tylenol and Tramadol as needed. Continue to wean off pain medication as tolerated.     Family/ staff Communication: Reviewed plan of care with patient and facility Nurse.   Labs/tests ordered: None   Mahima Hottle C Leith Hedlund, NP

## 2016-10-24 ENCOUNTER — Other Ambulatory Visit: Payer: Self-pay | Admitting: Surgery

## 2016-10-24 DIAGNOSIS — I71019 Dissection of thoracic aorta, unspecified: Secondary | ICD-10-CM

## 2016-10-24 DIAGNOSIS — I7101 Dissection of thoracic aorta: Secondary | ICD-10-CM

## 2016-11-04 ENCOUNTER — Telehealth: Payer: Self-pay | Admitting: Internal Medicine

## 2016-11-04 NOTE — Telephone Encounter (Signed)
Requesting generic letter stating that it is in Dr. Gwynn Burly medical opinion to deem it necessary to turn financial and health care power of attorney over to Dr. Shanon Brow Menius.   Patient has moved into [facility].  Patient can be contacted at [phone].  POA is on file.   Wants generic letter to accompany POA.

## 2016-11-05 NOTE — Telephone Encounter (Signed)
Left vm with Dr. Golden Circle that I have dropped letter in the mail.

## 2016-11-05 NOTE — Telephone Encounter (Signed)
Ok for letter as it has been documented as such at recent NH evaluation  Done hardcopy to [employer]

## 2016-11-05 NOTE — Telephone Encounter (Signed)
Letter is ready for pick-up.

## 2016-11-10 ENCOUNTER — Non-Acute Institutional Stay (SKILLED_NURSING_FACILITY): Payer: Medicare Other | Admitting: Internal Medicine

## 2016-11-10 ENCOUNTER — Encounter: Payer: Self-pay | Admitting: Internal Medicine

## 2016-11-10 DIAGNOSIS — E0840 Diabetes mellitus due to underlying condition with diabetic neuropathy, unspecified: Secondary | ICD-10-CM | POA: Diagnosis not present

## 2016-11-10 DIAGNOSIS — R350 Frequency of micturition: Secondary | ICD-10-CM

## 2016-11-10 DIAGNOSIS — R3981 Functional urinary incontinence: Secondary | ICD-10-CM

## 2016-11-10 DIAGNOSIS — N401 Enlarged prostate with lower urinary tract symptoms: Secondary | ICD-10-CM

## 2016-11-10 DIAGNOSIS — G8929 Other chronic pain: Secondary | ICD-10-CM | POA: Diagnosis not present

## 2016-11-10 DIAGNOSIS — B3789 Other sites of candidiasis: Secondary | ICD-10-CM

## 2016-11-10 DIAGNOSIS — M5441 Lumbago with sciatica, right side: Secondary | ICD-10-CM

## 2016-11-10 DIAGNOSIS — K219 Gastro-esophageal reflux disease without esophagitis: Secondary | ICD-10-CM

## 2016-11-10 DIAGNOSIS — M5442 Lumbago with sciatica, left side: Secondary | ICD-10-CM | POA: Diagnosis not present

## 2016-11-10 DIAGNOSIS — I10 Essential (primary) hypertension: Secondary | ICD-10-CM

## 2016-11-10 NOTE — Progress Notes (Signed)
Location:  [facility] Room Number: N-24 Place of Service:  SNF [phone]) Provider:  Blanchie Serve MD  Biagio Borg, MD  Patient Care Team: Biagio Borg, MD as PCP - General (Internal Medicine) Carlena Bjornstad, MD (Cardiology) Earnie Larsson, MD (Neurosurgery) Rutherford Guys, MD (Ophthalmology) Renda Rolls, Jennefer Bravo, MD as Referring Physician (Dermatology) Alexis Frock, MD as Consulting Physician (Urology) Dorothy Spark, MD as Consulting Physician (Cardiology)  Extended Emergency Contact Information Primary Emergency Contact: [place], [place] Phone: [phone] Mobile Phone: [phone] Relation: Friend Secondary Emergency Contact: Ervin Knack of [place] Phone: [phone] Relation: Friend  Code Status:  DNR Goals of care: Advanced Directive information Advanced Directives 11/10/2016  Does Patient Have a Medical Advance Directive? Yes  Type of Paramedic of [place];Living will;Out of facility DNR (pink MOST or yellow form)  Does patient want to make changes to medical advance directive? No - Patient declined  Copy of [place] in Chart? Yes  Would patient like information on creating a medical advance directive? -  Pre-existing out of facility DNR order (yellow form or pink MOST form) Yellow form placed in chart (order not valid for inpatient use)     Chief Complaint  Patient presents with  . Medical Management of Chronic Issues    Routine Visit     HPI:  Pt is a 83 y.o. male seen today for medical management of chronic diseases.  He complaints of rash underneath his breasts area with itching. No new concern from nursing. He complaints of occasional heartburn and indigestion. Denies any other concern.    Past Medical History:  Diagnosis Date  . ACE-inhibitor cough   . Aortic dissection [facility])    Surgical repair February, 2014    . Aortic stenosis    mild....echo... september... 2010/ mild... echo.[name] KitchenMarland KitchenDec, 2011  . Atrial fibrillation (HCC)     Chronic,   24 hour holter, September, 2011.... atrial fib rate is controlled.... there is some bradycardia but  no marked pauses  . Bladder cancer [facility])    recurrence with TUR-B March '09  . DDD (degenerative disc disease)    lumbar spine  . Diabetes mellitus    type 2  . Diabetes mellitus with neuropathy ([place]) 12/04/2006   Qualifier: Diagnosis of  By: Linda Hedges MD, Heinz Knuckles  medications - DPP4   . Diverticulosis of colon with hemorrhage 01/01/2014  . Ejection fraction    EF 60%, echo, 01/2010  . Fluid overload   . Fracture of metacarpal bone 11/30/11  . GERD (gastroesophageal reflux disease)   . Gout   . History of echocardiogram    Echo 5/17 - mild LVH, EF 55-60%, mod AS (mean 16 mmHg, peak 29 mmHg), MAC, midl MR, massive BAE, mod dilated RV, low normal RVSF, mod TR, PASP 64 mmHg  . Hx of colonoscopy    approx. 10 years  . Hypercholesterolemia   . Hyperlipidemia   . Hypertension   . Mitral regurgitation   . Normal nuclear stress test    06/2005 , also ABI normal 2007  . Prostate cancer ([place])    radiation tx.  . Pulmonary hypertension ([place])    47mHg, echo, 01/2010  . Shortness of breath    on exertion  . Warfarin anticoagulation    Atrial fib  . Whooping cough    Past Surgical History:  Procedure Laterality  Date  . ASCENDING AORTIC ROOT REPLACEMENT N/A 04/15/2012   Procedure: ASCENDING AORTIC ROOT REPLACEMENT;  Surgeon: Gaye Pollack, MD;  Location: [place];  Service: Open Heart Surgery;  Laterality: N/A;  . bladder cancer biopsy  10/16/2010   negative for malignancy  . bladder microscopic  04/13/2007   high grade papillary urothelial lesions  . COLONOSCOPY N/A 01/01/2014   Procedure: COLONOSCOPY;  Surgeon: Gatha Mayer, MD;  Location: [facility];  Service: Endoscopy;  Laterality: N/A;  . CYSTOSCOPY/RETROGRADE/URETEROSCOPY Bilateral 04/04/2013   Procedure:  CYSTOSCOPY WITH RETROGRADE PYELOGRAM AND BLADDER BIOPSY;  Surgeon: Molli Hazard, MD;  Location: [facility];  Service: Urology;  Laterality: Bilateral;  . 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 EXPLORATION POST OPERATIVE OPEN HEART  04/2012  . Herniatic disc surgery    . IR KYPHO LUMBAR INC FX REDUCE BONE BX UNI/BIL CANNULATION INC/IMAGING  07/25/2016  . LUMBAR LAMINECTOMY     '02  . PROSTATE BIOPSY  09/25/2011   GLEASON 3+3=6 AND 4+3=7  . TEE WITHOUT CARDIOVERSION N/A 04/15/2012   Procedure: TRANSESOPHAGEAL ECHOCARDIOGRAM (TEE);  Surgeon: Gaye Pollack, MD;  Location: [place];  Service: Open Heart Surgery;  Laterality: N/A;  . TRANSURETHRAL RESECTION OF BLADDER TUMOR N/A 06/13/2015   Procedure: TRANSURETHRAL RESECTION OF PROSTATE (TURP) CLOT EVACUATION AND FULGERATION, BLADDER STONE REMOVAL ;  Surgeon: Alexis Frock, MD;  Location: [facility];  Service: Urology;  Laterality: N/A;  . TUR-BT '06, '09      Allergies  Allergen Reactions  . Metoprolol Other (See Comments)    Reaction:  Bradycardia with 20m BID dose    Outpatient Encounter Prescriptions as of 11/10/2016  Medication Sig  . acetaminophen (TYLENOL) 500 MG tablet Take 500 mg by mouth every 8 (eight) hours as needed for mild pain, moderate pain, fever or headache.   . allopurinol (ZYLOPRIM) 300 MG tablet take 1 tablet by mouth once daily  . atorvastatin (LIPITOR) 20 MG tablet take 1 tablet by mouth once daily  . Calcium Carbonate-Vitamin D (CALCIUM 600+D) 600-400 MG-UNIT tablet Take 1 tablet by mouth at bedtime.  .[name] Kitchendesonide (DESOWEN) 0.05 % lotion Apply 1 application topically daily as needed (rosacea).   . docusate sodium (COLACE) 100 MG capsule Take 1 capsule (100 mg total) by mouth every 12 (twelve) hours.  . finasteride (PROSCAR) 5 MG tablet Take 1 tablet (5 mg total) by mouth every morning.  . furosemide (LASIX) 20 MG tablet Take 20 mg by mouth 2 (two) times daily. Hold for SBP <110  .  gabapentin (NEURONTIN) 100 MG capsule Take 1 capsule (100 mg total) by mouth 3 (three) times daily.  .[name] KitchenJANUVIA 100 MG tablet take 1 tablet by mouth once daily  . ketoconazole (NIZORAL) 2 % shampoo Apply 1 application topically as needed.   . mirabegron ER (MYRBETRIQ) 50 MG TB24 tablet Take 50 mg by mouth daily.  . Multiple Vitamin (THERAPEUTIC MULTIVITAMIN PO) Take 1 tablet by mouth daily.  .[name] Kitchenomeprazole (PRILOSEC) 20 MG capsule Take 20 mg by mouth daily.  .[name] KitchenoxyCODONE (ROXICODONE) 5 MG immediate release tablet Take 1 tablet (5 mg total) by mouth every 8 (eight) hours as needed for severe pain.  . polyethylene glycol (MIRALAX / GLYCOLAX) packet Take 17 g by mouth daily as needed.  . potassium chloride (K-DUR,KLOR-CON) 10 MEQ tablet take 1 tablet by mouth once daily  . senna (SENOKOT) 8.6 MG tablet Take 1 tablet by mouth at bedtime.   .[name] Kitchen  Tamsulosin HCl (FLOMAX) 0.4 MG CAPS Take 0.4 mg by mouth at bedtime.   [name] Kitchen tiZANidine (ZANAFLEX) 4 MG tablet Take 1 tablet (4 mg total) by mouth every 6 (six) hours as needed for muscle spasms.  . traMADol (ULTRAM) 50 MG tablet Take 1 tablet (50 mg total) by mouth every 6 (six) hours as needed for moderate pain.  . traZODone (DESYREL) 50 MG tablet Take 50 mg by mouth at bedtime.  [name] Kitchen warfarin (COUMADIN) 2.5 MG tablet Take 2.5 mg by mouth daily.  . [DISCONTINUED] warfarin (COUMADIN) 4 MG tablet Take 4 mg by mouth daily. Sunday, Monday, Wednesday, Friday and Saturday  . [DISCONTINUED] warfarin (COUMADIN) 5 MG tablet Take 5 mg by mouth daily. Tuesday and Thursday   No facility-administered encounter medications on file as of 11/10/2016.     Review of Systems  Constitutional: Negative for activity change, appetite change, chills, diaphoresis, fatigue and fever.  HENT: Negative for congestion, ear discharge, ear pain, hearing loss, mouth sores, postnasal drip, rhinorrhea, sinus pain, sore throat and trouble swallowing.   Eyes: Positive for visual disturbance. Negative for  pain and itching.       Wears corrective lenses  Respiratory: Negative for cough, shortness of breath and wheezing.   Cardiovascular: Positive for leg swelling. Negative for chest pain and palpitations.  Gastrointestinal: Negative for abdominal pain, blood in stool, constipation, diarrhea, nausea and vomiting.  Genitourinary: Positive for frequency. Negative for dysuria and flank pain.  Musculoskeletal: Positive for arthralgias, back pain and gait problem.       Unsteady gait  Skin: Negative for pallor, rash and wound.  Neurological: Positive for light-headedness. Negative for dizziness, seizures, numbness and headaches.  Hematological: Bruises/bleeds easily.  Psychiatric/Behavioral: Negative for behavioral problems, decreased concentration and dysphoric mood.    Immunization History  Administered Date(s) Administered  . H1N1 03/07/2008  . Influenza Split 12/04/2010, 11/10/2011  . Influenza Whole 12/06/2007, 11/29/2008, 11/12/2009  . Influenza, High Dose Seasonal PF 12/02/2012  . Influenza,inj,Quad PF,6+ Mos 10/26/2013  . Influenza-Unspecified 11/10/2014, 12/04/2015  . Pneumococcal Conjugate-13 04/11/2013  . Pneumococcal Polysaccharide-23 06/03/2006  . Td 03/07/2008  . Zoster 11/14/2009   Pertinent  Health Maintenance Due  Topic Date Due  . INFLUENZA VACCINE  12/01/2016 (Originally 10/01/2016)  . OPHTHALMOLOGY EXAM  03/03/2017 (Originally 11/30/2015)  . URINE MICROALBUMIN  03/03/2017 (Originally 04/09/2016)  . FOOT EXAM  01/14/2017  . HEMOGLOBIN A1C  01/30/2017  . PNA vac Low Risk Adult  Completed   Fall Risk  07/07/2016 01/15/2016 01/04/2016 10/19/2015 04/10/2015  Falls in the past year? No Yes Yes No No  Number falls in past yr: - 1 1 - -  Comment - - - - -  Injury with Fall? - No No - -  Comment - - - - -  Risk for fall due to : - Impaired balance/gait;Impaired mobility - - -  Follow up - Falls prevention discussed - - -   Functional Status Survey:    Vitals:   11/10/16 1107    BP: (!) 160/80  Pulse: (!) 53  Resp: 20  Temp: 97.8 F (36.6 C)  TempSrc: Oral  SpO2: 96%  Weight: 186 lb (84.4 kg)  Height: _0  (1.727 m)   Body mass index is 28.28 kg/m.   Wt Readings from Last 3 Encounters:  11/10/16 186 lb (84.4 kg)  10/14/16 184 lb (83.5 kg)  09/22/16 186 lb (84.4 kg)   Physical Exam  Constitutional: He is oriented to person, place, and time. He  appears well-developed and well-nourished. No distress.  HENT:  Head: Normocephalic and atraumatic.  Mouth/Throat: Oropharynx is clear and moist.  Eyes: Pupils are equal, round, and reactive to light.  Wears corrective glasses  Neck: Normal range of motion. Neck supple. No thyromegaly present.  Cardiovascular:  Irregular HR, systolic murmur present  Pulmonary/Chest: Effort normal. No respiratory distress. He has no wheezes. He has no rales.  Abdominal: Soft. Bowel sounds are normal. He exhibits no distension. There is no tenderness.  Musculoskeletal:  Can move all 4 extremities, trace leg edema, uses a walker to get around, unsteady gait, 1 person minimum assist with transfer  Lymphadenopathy:    He has no cervical adenopathy.  Neurological: He is alert and oriented to person, place, and time.  Skin: Skin is warm and dry. He is not diaphoretic.  Chronic skin changes to lower legs, erythema underneath both breast fold  Psychiatric: He has a normal mood and affect. His behavior is normal.    Labs reviewed:  Recent Labs  07/22/16 0531  07/26/16 0537  07/27/16 0511  07/28/16 0520 08/04/16 10/06/16  NA 137  < > 139  < > 138  < > 140 138 141  K 3.6  < > 3.5  < > 3.8  --  3.7 4.1 3.7  CL 99*  < > 100*  --  99*  --  99*  --   --   CO2 30  < > 28  --  30  --  30  --   --   GLUCOSE 126*  < > 112*  --  114*  --  117*  --   --   BUN 15  < > 22*  < > 24*  < > 24* 17 13  CREATININE 0.93  < > 0.77  < > 1.01  < > 0.97 0.9 1.2  CALCIUM 9.2  < > 9.7  --  9.5  --  9.7  --   --   MG 1.7  --  1.7  --   --   --  1.7   --   --   < > = values in this interval not displayed.  Recent Labs  04/07/16 1311 07/07/16 1725 07/10/16 1546  AST 21 25 38  ALT _0 ALKPHOS 96 80 81  BILITOT 1.49* 0.9 1.8*  PROT 8.0 7.2 7.9  ALBUMIN 4.2 4.4 4.4    Recent Labs  07/10/16 1546  07/20/16 0505  07/25/16 0527  07/26/16 0537  07/27/16 0511 07/28/16 07/28/16 0520 08/04/16  WBC 15.9*  < > 12.2*  < > 11.8*  < > 9.9  < > 10.0 9.1 9.1 9.8  NEUTROABS 13.6*  --  9.7*  --  10.4*  --   --   --   --   --   --   --   HGB 14.3  < > 13.2  < > 12.7*  < > 13.0  < > 14.0  --  14.0 13.7  HCT 41.7  < > 40.6  < > 38.6*  < > 39.6  < > 44.1  --  42.5 42  MCV 92.3  < > 95.3  < > 94.8  --  94.3  --  95.2  --  95.3  --   PLT 252  < > 197  < > 216  < > 207  < > 225  --  227 241  < > = values in this interval not displayed.  Lab Results  Component Value Date   TSH 1.48 04/10/2015   Lab Results  Component Value Date   HGBA1C 5.9 07/31/2016   Lab Results  Component Value Date   CHOL 91 10/06/2016   HDL 30 (A) 10/06/2016   LDLCALC 42 10/06/2016   TRIG 107 10/06/2016   CHOLHDL 2 04/10/2015    Significant Diagnostic Results in last 30 days:  No results found.  Assessment/Plan  [place] rash Underneath his breasts. Clean area bid, avoid scrubbing, apply nystatin cream bid x 2 weeks and then as needed  Urinary incontinence Stable symptom. Continue mirabegron and monitor  Hypertension Few low BP reading on revie.  with 90s/50s. Elevated BP today. Otherwise normal BP. Denies any symptom associated with this . Currently on furosemide 20 mg bid. Continue this. Reviewed BMP  BPH Continue proscar and finasteride.   gerd Overall stable with occasional symptom. Continue omeprazole for now with history of gi bleed.   Chronic back pain With DDD and history of lumbar fracture. Continue to use walker to help with gait and balance. Currently on  tramadol 50 mg q6h prn with oxyIR 5 mg q8h prn and tizanidine 4 mg q6h prn for  muscle spasm. Denies muscle spasticity today but has required it intermittently. Decrease tizanidine to 4 mg q8h prn pain. Has not required oxycodone at all in last 2 weeks. Discontinue this. Continue prn tramadol  Type 2 diabetes mellitus with neuropathy Lab Results  Component Value Date   HGBA1C 5.9 07/31/2016   a1c at goal. Currently on januvia 100 mg daily. Decrease januvia to 50 mg daily. LDL at goal. Decrease atorvastatin to 10 mg daily. Continue gabapentin. Check urine for microalbumin    Family/ staff Communication: reviewed care plan with patient and charge nurse.    Labs/tests ordered:  Urine microalbumin, a1c  Blanchie Serve, MD Internal Medicine [facility] [address] [place], [place] [address] Cell Phone (Monday-Friday 8 am - 5 pm): [phone] On Call: [phone] and follow prompts after 5 pm and on weekends Office Phone: [phone] Office Fax: [phone]

## 2016-11-11 DIAGNOSIS — M549 Dorsalgia, unspecified: Secondary | ICD-10-CM | POA: Diagnosis not present

## 2016-11-11 LAB — MICROALBUMIN, URINE: MICROALB UR: 35.7

## 2016-11-12 ENCOUNTER — Non-Acute Institutional Stay (SKILLED_NURSING_FACILITY): Payer: Medicare Other | Admitting: Family

## 2016-11-12 DIAGNOSIS — E1129 Type 2 diabetes mellitus with other diabetic kidney complication: Secondary | ICD-10-CM

## 2016-11-12 DIAGNOSIS — I5032 Chronic diastolic (congestive) heart failure: Secondary | ICD-10-CM

## 2016-11-12 DIAGNOSIS — I110 Hypertensive heart disease with heart failure: Secondary | ICD-10-CM

## 2016-11-12 DIAGNOSIS — R809 Proteinuria, unspecified: Secondary | ICD-10-CM

## 2016-11-12 NOTE — Progress Notes (Addendum)
Location:  [facility] Room Number: 24  Place of Service:  SNF [phone]) Provider: Dinah Ngetich FNP-C  Biagio Borg, MD  Patient Care Team: Biagio Borg, MD as PCP - General (Internal Medicine) Carlena Bjornstad, MD (Cardiology) Earnie Larsson, MD (Neurosurgery) Rutherford Guys, MD (Ophthalmology) Renda Rolls, Jennefer Bravo, MD as Referring Physician (Dermatology) Alexis Frock, MD as Consulting Physician (Urology) Dorothy Spark, MD as Consulting Physician (Cardiology)  Extended Emergency Contact Information Primary Emergency Contact: Le[place], [place] of [place] Phone: [phone] Mobile Phone: [phone] Relation: Friend Secondary Emergency Contact: Ervin Knack of [place] Phone: [phone] Relation: Friend  Code Status:  DNR Goals of care: Advanced Directive information Advanced Directives 11/12/2016  Does Patient Have a Medical Advance Directive? Yes  Type of Advance Directive -  Does patient want to make changes to medical advance directive? -  Copy of [place] in Chart? Yes  Would patient like information on creating a medical advance directive? -  Pre-existing out of facility DNR order (yellow form or pink MOST form) -     Chief Complaint  Patient presents with  . Acute Visit    abnormal lab results    HPI:  Pt is a 83 y.o. male seen today at [facility] for an acute visit for evaluation of abnormal lab results. He has a significant medical history of Type 2 DM, HTN, CHF, Afib among other conditions. He is seen in his room today. He denies any acute issues this visit. His recent urine lab results for Microalbumin /creatinine ratio results 253 ( 11/11/2016). His blood pressure reviewed soft low blood pressure with occasional elevated B/p noted. He denies any fever, chills, chest pain, HA, dizziness or shortness of breath.        Past Medical History:    Diagnosis Date  . ACE-inhibitor cough   . Aortic dissection [facility])    Surgical repair February, 2014  . Aortic stenosis    mild....echo... september... 2010/ mild... echo.[name] KitchenMarland KitchenDec, 2011  . Atrial fibrillation (HCC)     Chronic,   24 hour holter, September, 2011.... atrial fib rate is controlled.... there is some bradycardia but  no marked pauses  . Bladder cancer [facility])    recurrence with TUR-B March '09  . DDD (degenerative disc disease)    lumbar spine  . Diabetes mellitus    type 2  . Diabetes mellitus with neuropathy ([place]) 12/04/2006   Qualifier: Diagnosis of  By: Linda Hedges MD, Heinz Knuckles  medications - DPP4   . Diverticulosis of colon with hemorrhage 01/01/2014  . Ejection fraction    EF 60%, echo, 01/2010  . Fluid overload   . Fracture of metacarpal bone 11/30/11  . GERD (gastroesophageal reflux disease)   . Gout   . History of echocardiogram    Echo 5/17 - mild LVH, EF 55-60%, mod AS (mean 16 mmHg, peak 29 mmHg), MAC, midl MR, massive BAE, mod dilated RV, low normal RVSF, mod TR, PASP 64 mmHg  . Hx of colonoscopy    approx. 10 years  . Hypercholesterolemia   . Hyperlipidemia   . Hypertension   . Mitral regurgitation   . Normal nuclear stress test    06/2005 , also ABI normal 2007  . Prostate cancer ([place])    radiation tx.  . Pulmonary hypertension ([place])    46mHg, echo, 01/2010  . Shortness of breath  on exertion  . Warfarin anticoagulation    Atrial fib  . Whooping cough    Past Surgical History:  Procedure Laterality Date  . ASCENDING AORTIC ROOT REPLACEMENT N/A 04/15/2012   Procedure: ASCENDING AORTIC ROOT REPLACEMENT;  Surgeon: Gaye Pollack, MD;  Location: [facility];  Service: Open Heart Surgery;  Laterality: N/A;  . bladder cancer biopsy  10/16/2010   negative for malignancy  . bladder microscopic  04/13/2007   high grade papillary urothelial lesions  . COLONOSCOPY N/A 01/01/2014   Procedure: COLONOSCOPY;  Surgeon: Gatha Mayer, MD;  Location: [facility];  Service:  Endoscopy;  Laterality: N/A;  . CYSTOSCOPY/RETROGRADE/URETEROSCOPY Bilateral 04/04/2013   Procedure: CYSTOSCOPY WITH RETROGRADE PYELOGRAM AND BLADDER BIOPSY;  Surgeon: Molli Hazard, MD;  Location: [facility];  Service: Urology;  Laterality: Bilateral;  . 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 EXPLORATION POST OPERATIVE OPEN HEART  04/2012  . Herniatic disc surgery    . IR KYPHO LUMBAR INC FX REDUCE BONE BX UNI/BIL CANNULATION INC/IMAGING  07/25/2016  . LUMBAR LAMINECTOMY     '02  . PROSTATE BIOPSY  09/25/2011   GLEASON 3+3=6 AND 4+3=7  . TEE WITHOUT CARDIOVERSION N/A 04/15/2012   Procedure: TRANSESOPHAGEAL ECHOCARDIOGRAM (TEE);  Surgeon: Gaye Pollack, MD;  Location: [facility];  Service: Open Heart Surgery;  Laterality: N/A;  . TRANSURETHRAL RESECTION OF BLADDER TUMOR N/A 06/13/2015   Procedure: TRANSURETHRAL RESECTION OF PROSTATE (TURP) CLOT EVACUATION AND FULGERATION, BLADDER STONE REMOVAL ;  Surgeon: Alexis Frock, MD;  Location: [facility];  Service: Urology;  Laterality: N/A;  . TUR-BT '06, '09      Allergies  Allergen Reactions  . Metoprolol Other (See Comments)    Reaction:  Bradycardia with 10m BID dose    Outpatient Encounter Prescriptions as of 11/12/2016  Medication Sig  . acetaminophen (TYLENOL) 500 MG tablet Take 500 mg by mouth every 8 (eight) hours as needed for mild pain, moderate pain, fever or headache.   . allopurinol (ZYLOPRIM) 300 MG tablet take 1 tablet by mouth once daily  . atorvastatin (LIPITOR) 10 MG tablet Take 10 mg by mouth daily.  . Calcium Carbonate-Vitamin D (CALCIUM 600+D) 600-400 MG-UNIT tablet Take 1 tablet by mouth at bedtime.  .[name] Kitchendesonide (DESOWEN) 0.05 % lotion Apply 1 application topically daily as needed (rosacea).   . docusate sodium (COLACE) 100 MG capsule Take 1 capsule (100 mg total) by mouth every 12 (twelve) hours.  . finasteride (PROSCAR) 5 MG tablet Take 1 tablet (5 mg total) by mouth every morning.  .  furosemide (LASIX) 20 MG tablet Take 20 mg by mouth 2 (two) times daily. Hold for SBP <110  . gabapentin (NEURONTIN) 100 MG capsule Take 1 capsule (100 mg total) by mouth 3 (three) times daily.  .[name] Kitchenketoconazole (NIZORAL) 2 % shampoo Apply 1 application topically as needed.   . mirabegron ER (MYRBETRIQ) 50 MG TB24 tablet Take 50 mg by mouth daily.  . Multiple Vitamin (THERAPEUTIC MULTIVITAMIN PO) Take 1 tablet by mouth daily.  .[name] Kitchenomeprazole (PRILOSEC) 20 MG capsule Take 20 mg by mouth daily.  . polyethylene glycol (MIRALAX / GLYCOLAX) packet Take 17 g by mouth daily as needed.  . potassium chloride (K-DUR,KLOR-CON) 10 MEQ tablet take 1 tablet by mouth once daily  . senna (SENOKOT) 8.6 MG tablet Take 1 tablet by mouth at bedtime.   . sitaGLIPtin (JANUVIA) 50 MG tablet Take 50 mg by mouth daily.  . Tamsulosin  HCl (FLOMAX) 0.4 MG CAPS Take 0.4 mg by mouth at bedtime.   [name] Kitchen tiZANidine (ZANAFLEX) 4 MG tablet Take 1 tablet (4 mg total) by mouth every 6 (six) hours as needed for muscle spasms. (Patient taking differently: Take 4 mg by mouth every 8 (eight) hours as needed for muscle spasms. )  . traMADol (ULTRAM) 50 MG tablet Take 1 tablet (50 mg total) by mouth every 6 (six) hours as needed for moderate pain.  . traZODone (DESYREL) 50 MG tablet Take 50 mg by mouth at bedtime.  [name] Kitchen warfarin (COUMADIN) 2.5 MG tablet Take 2.5 mg by mouth daily.  . [DISCONTINUED] atorvastatin (LIPITOR) 20 MG tablet take 1 tablet by mouth once daily  . [DISCONTINUED] JANUVIA 100 MG tablet take 1 tablet by mouth once daily  . [DISCONTINUED] oxyCODONE (ROXICODONE) 5 MG immediate release tablet Take 1 tablet (5 mg total) by mouth every 8 (eight) hours as needed for severe pain.   No facility-administered encounter medications on file as of 11/12/2016.     Review of Systems  Constitutional: Negative for activity change, chills, fatigue and fever.  HENT: Negative for congestion, rhinorrhea, sinus pain, sinus pressure and sore  throat.   Eyes: Negative for discharge, redness and itching.  Respiratory: Negative for cough, chest tightness, shortness of breath and wheezing.   Cardiovascular: Negative for chest pain, palpitations and leg swelling.  Gastrointestinal: Negative for abdominal pain, constipation, diarrhea, nausea and vomiting.  Endocrine: Negative for polydipsia, polyphagia and polyuria.  Genitourinary: Negative for dysuria, flank pain, frequency and urgency.  Musculoskeletal: Positive for gait problem.  Skin: Negative for color change, pallor and rash.  Neurological: Negative for dizziness, light-headedness and headaches.  Psychiatric/Behavioral: Negative for agitation, confusion, hallucinations and sleep disturbance. The patient is not nervous/anxious.     Immunization History  Administered Date(s) Administered  . H1N1 03/07/2008  . Influenza Split 12/04/2010, 11/10/2011  . Influenza Whole 12/06/2007, 11/29/2008, 11/12/2009  . Influenza, High Dose Seasonal PF 12/02/2012  . Influenza,inj,Quad PF,6+ Mos 10/26/2013  . Influenza-Unspecified 11/10/2014, 12/04/2015  . Pneumococcal Conjugate-13 04/11/2013  . Pneumococcal Polysaccharide-23 06/03/2006  . Td 03/07/2008  . Zoster 11/14/2009   Pertinent  Health Maintenance Due  Topic Date Due  . INFLUENZA VACCINE  12/01/2016 (Originally 10/01/2016)  . OPHTHALMOLOGY EXAM  03/03/2017 (Originally 11/30/2015)  . URINE MICROALBUMIN  03/03/2017 (Originally 04/09/2016)  . FOOT EXAM  01/14/2017  . HEMOGLOBIN A1C  01/30/2017  . PNA vac Low Risk Adult  Completed   Fall Risk  07/07/2016 01/15/2016 01/04/2016 10/19/2015 04/10/2015  Falls in the past year? No Yes Yes No No  Number falls in past yr: - 1 1 - -  Comment - - - - -  Injury with Fall? - No No - -  Comment - - - - -  Risk for fall due to : - Impaired balance/gait;Impaired mobility - - -  Follow up - Falls prevention discussed - - -    Vitals:   11/12/16 1438  BP: 110/60  Pulse: (!) 53  Resp: 20  Temp: 98.1  F (36.7 C)  SpO2: 96%  Weight: 186 lb (84.4 kg)  Height: 5' 8"  (1.727 m)   Body mass index is 28.28 kg/m. Physical Exam  Constitutional: He is oriented to person, place, and time. He appears well-developed and well-nourished. No distress.  HENT:  Head: Normocephalic.  Mouth/Throat: Oropharynx is clear and moist. No oropharyngeal exudate.  Eyes: Pupils are equal, round, and reactive to light. Conjunctivae and EOM are normal. Left  eye exhibits no discharge. No scleral icterus.  Neck: Normal range of motion. No JVD present. No thyromegaly present.  Cardiovascular: Intact distal pulses.  Exam reveals no gallop and no friction rub.   Murmur heard. HR irregular   Pulmonary/Chest: Effort normal and breath sounds normal. No respiratory distress. He has no wheezes. He has no rales.  Abdominal: Soft. Bowel sounds are normal. He exhibits no distension. There is no tenderness. There is no rebound and no guarding.  Musculoskeletal: He exhibits no edema or tenderness.  Unsteady gait uses FWW.   Neurological: He is oriented to person, place, and time. Coordination normal.  Skin: Skin is warm and dry. No rash noted. No erythema. No pallor.  Skin redness breast fold has improved  Psychiatric: He has a normal mood and affect.    Labs reviewed:  Recent Labs  07/22/16 0531  07/26/16 0537  07/27/16 0511  07/28/16 0520 08/04/16 10/06/16  NA 137  < > 139  < > 138  < > 140 138 141  K 3.6  < > 3.5  < > 3.8  --  3.7 4.1 3.7  CL 99*  < > 100*  --  99*  --  99*  --   --   CO2 30  < > 28  --  30  --  30  --   --   GLUCOSE 126*  < > 112*  --  114*  --  117*  --   --   BUN 15  < > 22*  < > 24*  < > 24* 17 13  CREATININE 0.93  < > 0.77  < > 1.01  < > 0.97 0.9 1.2  CALCIUM 9.2  < > 9.7  --  9.5  --  9.7  --   --   MG 1.7  --  1.7  --   --   --  1.7  --   --   < > = values in this interval not displayed.  Recent Labs  04/07/16 1311 07/07/16 1725 07/10/16 1546  AST 21 25 38  ALT 13 16 27   ALKPHOS  96 80 81  BILITOT 1.49* 0.9 1.8*  PROT 8.0 7.2 7.9  ALBUMIN 4.2 4.4 4.4    Recent Labs  07/10/16 1546  07/20/16 0505  07/25/16 0527  07/26/16 0537  07/27/16 0511 07/28/16 07/28/16 0520 08/04/16  WBC 15.9*  < > 12.2*  < > 11.8*  < > 9.9  < > 10.0 9.1 9.1 9.8  NEUTROABS 13.6*  --  9.7*  --  10.4*  --   --   --   --   --   --   --   HGB 14.3  < > 13.2  < > 12.7*  < > 13.0  < > 14.0  --  14.0 13.7  HCT 41.7  < > 40.6  < > 38.6*  < > 39.6  < > 44.1  --  42.5 42  MCV 92.3  < > 95.3  < > 94.8  --  94.3  --  95.2  --  95.3  --   PLT 252  < > 197  < > 216  < > 207  < > 225  --  227 241  < > = values in this interval not displayed. Lab Results  Component Value Date   TSH 1.48 04/10/2015   Lab Results  Component Value Date   HGBA1C 5.9 07/31/2016   Lab Results  Component Value Date   CHOL 91 10/06/2016   HDL 30 (A) 10/06/2016   LDLCALC 42 10/06/2016   TRIG 107 10/06/2016   CHOLHDL 2 04/10/2015    Significant Diagnostic Results in last 30 days:  No results found.  Assessment/Plan 1. Type 2 diabetes mellitus with albuminuria Lab Results  Component Value Date   HGBA1C 5.9 07/31/2016  CBG readings in the 110's.currently on Januvia 50 mg tablet daily.Awaiting Hgb A1C recheck with next facility lab. No signs of  Hypoglycemia.Microalbumin/creatinine ration was 253 ( 11/11/2016).  B/p readings reviewed soft low B/P readings noted.No ACE inhibitor or ARB prescribed for renal protection this visit due to soft low blood pressure. Continue to monitor.   Addendum: 11/14/2016 Hgb A1C results 5.2 ( 11/13/2016). Decreased Januvia to 25 mg tablet daily. Continue to monitor CBG. Notify provider for CBG's = or < 75. Discontinue Januvia if CBG's remain stable < 200.   2. Hypertensive heart disease with chronic diastolic congestive heart failure  Currently off meds. Microalbumin/creatinine ration was 253 ( 11/11/2016).Last GFR 75 ( 08/04/2016). B/p readings reviewed soft low B/P readings noted.No ACE  inhibitor or ARB prescribed this visit due to soft low blood pressure.continue to monitor CMP.   Family/ staff Communication: Reviewed plan of care with patient and facility Nurse.   Labs/tests ordered: None   Dinah C Ngetich, NP

## 2016-11-13 DIAGNOSIS — E1140 Type 2 diabetes mellitus with diabetic neuropathy, unspecified: Secondary | ICD-10-CM | POA: Diagnosis not present

## 2016-11-13 LAB — HEMOGLOBIN A1C: Hemoglobin A1C: 5.2

## 2016-11-26 ENCOUNTER — Ambulatory Visit
Admission: RE | Admit: 2016-11-26 | Discharge: 2016-11-26 | Disposition: A | Discharge status: Home / Self Care | Payer: Medicare Other | Source: Ambulatory Visit | Attending: Surgery | Admitting: Surgery

## 2016-11-26 ENCOUNTER — Encounter: Payer: Self-pay | Admitting: Surgery

## 2016-11-26 ENCOUNTER — Ambulatory Visit (INDEPENDENT_AMBULATORY_CARE_PROVIDER_SITE_OTHER): Payer: Medicare Other | Admitting: Surgery

## 2016-11-26 VITALS — BP 163/91 | HR 52 | Resp 16 | Ht 68.0 in | Wt 186.0 lb

## 2016-11-26 DIAGNOSIS — I71019 Dissection of thoracic aorta, unspecified: Secondary | ICD-10-CM

## 2016-11-26 DIAGNOSIS — Z09 Encounter for follow-up examination after completed treatment for conditions other than malignant neoplasm: Secondary | ICD-10-CM | POA: Diagnosis not present

## 2016-11-26 DIAGNOSIS — I712 Thoracic aortic aneurysm, without rupture: Secondary | ICD-10-CM | POA: Diagnosis not present

## 2016-11-26 DIAGNOSIS — I7101 Dissection of thoracic aorta: Secondary | ICD-10-CM | POA: Diagnosis not present

## 2016-11-26 MED ORDER — IOPAMIDOL (ISOVUE-370) INJECTION 76%
75.0000 mL | Freq: Once | INTRAVENOUS | Status: AC | PRN
  Administered 2016-11-26: 75 mL via INTRAVENOUS

## 2016-11-27 ENCOUNTER — Encounter: Payer: Self-pay | Admitting: Surgery

## 2016-11-27 NOTE — Progress Notes (Signed)
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 I last saw him a year ago and the aortic arch aneurysm was stable at 5.1 cm. Over the past year he has moved to [facility] and is not as active over the past couple years. He developed urinary incontinence a couple years ago and that has prevented him from traveling. He says that he walks some but is fairly sedentary now.  Current Outpatient Prescriptions  Medication Sig Dispense Refill  . acetaminophen (TYLENOL) 500 MG tablet Take 500 mg by mouth every 8 (eight) hours as needed for mild pain, moderate pain, fever or headache.     . allopurinol (ZYLOPRIM) 300 MG tablet take 1 tablet by mouth once daily 90 tablet 3  . atorvastatin (LIPITOR) 10 MG tablet Take 10 mg by mouth daily.    . Calcium Carbonate-Vitamin D (CALCIUM 600+D) 600-400 MG-UNIT tablet Take 1 tablet by mouth at bedtime.    [name] Kitchen desonide (DESOWEN) 0.05 % lotion Apply 1 application topically daily as needed (rosacea).     . docusate sodium (COLACE) 100 MG capsule Take 1 capsule (100 mg total) by mouth every 12 (twelve) hours. 60 capsule 0  . finasteride (PROSCAR) 5 MG tablet Take 1 tablet (5 mg total) by mouth every morning. 90 tablet 3  . furosemide (LASIX) 20 MG tablet Take 20 mg by mouth 2 (two) times daily. Hold for SBP <110    . gabapentin (NEURONTIN) 100 MG capsule Take 1 capsule (100 mg total) by mouth 3 (three) times daily. 90 capsule 3  . ketoconazole (NIZORAL) 2 % shampoo Apply 1 application topically as needed.     . mirabegron ER (MYRBETRIQ) 50 MG TB24 tablet Take 50 mg by mouth daily.    . Multiple Vitamin (THERAPEUTIC MULTIVITAMIN PO) Take 1 tablet by mouth daily.    [name] Kitchen omeprazole (PRILOSEC) 20 MG capsule Take 20 mg by mouth daily.    . polyethylene glycol (MIRALAX / GLYCOLAX) packet Take 17 g by mouth daily as needed. 14 each 0    . potassium chloride (K-DUR,KLOR-CON) 10 MEQ tablet take 1 tablet by mouth once daily 90 tablet 2  . senna (SENOKOT) 8.6 MG tablet Take 1 tablet by mouth at bedtime.     . sitaGLIPtin (JANUVIA) 50 MG tablet Take 50 mg by mouth daily.    . Tamsulosin HCl (FLOMAX) 0.4 MG CAPS Take 0.4 mg by mouth at bedtime.     [name] Kitchen tiZANidine (ZANAFLEX) 4 MG tablet Take 1 tablet (4 mg total) by mouth every 6 (six) hours as needed for muscle spasms. (Patient taking differently: Take 4 mg by mouth every 8 (eight) hours as needed for muscle spasms. ) 30 tablet 1  . traMADol (ULTRAM) 50 MG tablet Take 1 tablet (50 mg total) by mouth every 6 (six) hours as needed for moderate pain. 20 tablet 0  . traZODone (DESYREL) 50 MG tablet Take 50 mg by mouth at bedtime.    [name] Kitchen warfarin (COUMADIN) 2.5 MG tablet Take 2.5 mg by mouth daily.     No current facility-administered medications for this visit.      Physical Exam: BP (!) 163/91 (BP Location: Right Arm, Patient Position: Sitting, Cuff Size: Large)   Pulse (!) 52   Resp 16   Ht 5\' 8"  (1.727 m)   Wt 186 lb (84.4 kg)  SpO2 94% Comment: ON RA  BMI 28.28 kg/m  He looks more frail than last year and is in a wheel chair. His friend is with him.  Cardiac exam shows a regular rate and rhythm with normal heart sounds and no murmurs. Lungs are clear  Diagnostic Tests:  CLINICAL DATA:  83 year old male status post graft repair of the ascending thoracic aorta. Evaluation for aortic root replacement.  EXAM: CT ANGIOGRAPHY CHEST WITH CONTRAST  TECHNIQUE: Multidetector CT imaging of the chest was performed using the standard protocol during bolus administration of intravenous contrast. Multiplanar CT image reconstructions and MIPs were obtained to evaluate the vascular anatomy.  CONTRAST:  75 mL of Isovue 370.  COMPARISON:  Chest CT 11/21/2015.  FINDINGS: Creatinine was obtained on site at [place] at [address]  Results: Creatinine  0.9 mg/dL.  Cardiovascular: Heart size is enlarged with biatrial dilatation. Small filling defect in the tip of the left atrial appendage (axial image 59 of series 4), highly concerning for left atrial appendage thrombus. There is no significant pericardial fluid, thickening or pericardial calcification. There is aortic atherosclerosis, as well as atherosclerosis of the great vessels of the mediastinum and the coronary arteries, including calcified atherosclerotic plaque in the left main, left anterior descending, left circumflex and right coronary arteries. Severe thickening and calcification of the aortic valve. Status post median sternotomy for graft repair of the ascending thoracic aorta. Aneurysmal dilatation of the proximal aortic arch which measures up to 5.5 cm in diameter, similar to prior study 11/21/2015 when measured in a similar fashion (outer diameter of the vessel as measured on image 34 of series 4 of the prior examination). Chronic dissection of the thoracic aorta involving the arch and descending thoracic aorta extending into the upper abdominal aorta again noted. Compared to the prior study aneurysmal dilatation of the distal aortic arch (4.9 cm in diameter) has slightly increased (previously 4.6 cm in diameter). The false lumen does not opacify with IV contrast in the thorax.  Mediastinum/Nodes: No pathologically enlarged mediastinal or hilar lymph nodes. Esophagus is unremarkable in appearance. No axillary lymphadenopathy.  Lungs/Pleura: Mild scarring in the inferior segment of the lingula. Several tiny perifissural nodules measuring 4 mm or less in size are noted, considered benign. No other larger more suspicious appearing pulmonary nodules or masses are noted. No acute consolidative airspace disease. No pleural effusions.  Upper Abdomen: Liver has a shrunken appearance and nodular contour, indicative of cirrhosis. Multiple calcified gallstones  lie dependently in the gallbladder. No findings to suggest an acute cholecystitis at this time. Aortic atherosclerosis with chronic dissection of the super renal, renal and infrarenal abdominal aorta. The dissection flap extends into the right renal artery. Left renal artery is fed by the true lumen. Celiac axis, superior mesenteric artery and inferior mesenteric artery all appear to arise from the false lumen which is patent in the abdomen. 2.3 cm low-attenuation lesion in the upper pole the right kidney is compatible with a cyst.  Musculoskeletal: Median sternotomy wires. There are no aggressive appearing lytic or blastic lesions noted in the visualized portions of the skeleton. New compression fracture of L2 with approximately 50% loss of anterior vertebral body height and post vertebroplasty changes. There are no aggressive appearing lytic or blastic lesions noted in the visualized portions of the skeleton.  Review of the MIP images confirms the above findings.  IMPRESSION: 1. Status post graft repair of the ascending thoracic aorta, with persistent aneurysmal dilatation and chronic dissection of the  aortic arch. The aortic dissection extends into the abdomen, as detailed above. The aortic arch is generally stable in size, with exception of the distal arch which is increased 4.6 cm in diameter on the prior study 11/21/2015 at 4.8 cm in diameter on today's examination. Maximal diameter of the aortic arch is proximally measuring up to 5.5 cm. 2. Severe thickening calcification of the aortic valve. 3. Aortic atherosclerosis, in addition to left main and 3 vessel coronary artery disease. 4. Cardiomegaly with biatrial dilatation. Importantly, there is a filling defect in the tip of the left atrial appendage which is highly concerning for left atrial appendage thrombus. Correlation with transesophageal echocardiography is suggested if clinically appropriate, as this may place the  patient at risk for systemic embolization. 5. Morphologic changes in the liver indicative of cirrhosis. 6. Cholelithiasis without evidence to suggest an acute cholecystitis at this time. 7. Additional incidental findings, as above. These results will be called to the ordering clinician or representative by the Radiologist Assistant, and communication documented in the PACS or zVision Dashboard.  Aortic Atherosclerosis (ICD10-I70.0) and Aortic aneurysm NOS (ICD10-I71.9).   Electronically Signed   By: Vinnie Langton M.D.   On: 11/26/2016 14:06   Impression:  He has chronic dissection of the aortic arch and descending aorta extending into the abdomen with no flow in the false lumen. The aortic arch is fairly stable compared to last year with a maximal diameter proximally of 5.5 cm and distally 4.8 cm. I reviewed the CTA images with him and his friend and answered his questions. He is 11 years old and becoming more frail and I would not consider him a candidate for redo sternotomy for arch replacement or debranching and TEVAR. I discussed this with him and he is in full agreement that he would not want any further surgery for this. Hopefully the aneurysm will remain stable. Keeping his BP under good control is important. I don't see an reason to do further CT scans to follow this if we are not going to do anything surgically and he is in agreement.   Plan:  He will continue to be followed by his PCP and I will be happy to see him back if the need arises.   I spent 15 minutes performing this established patient evaluation and > 50% of this time was spent face to face counseling and coordinating the care of this patient's aortic aneurysm.   Gaye Pollack, MD [facility] [phone]

## 2016-12-10 ENCOUNTER — Encounter: Payer: Self-pay | Admitting: Family

## 2016-12-10 ENCOUNTER — Non-Acute Institutional Stay (SKILLED_NURSING_FACILITY): Payer: Medicare Other | Admitting: Family

## 2016-12-10 DIAGNOSIS — E782 Mixed hyperlipidemia: Secondary | ICD-10-CM | POA: Diagnosis not present

## 2016-12-10 DIAGNOSIS — I5032 Chronic diastolic (congestive) heart failure: Secondary | ICD-10-CM

## 2016-12-10 DIAGNOSIS — I482 Chronic atrial fibrillation, unspecified: Secondary | ICD-10-CM

## 2016-12-10 DIAGNOSIS — E1140 Type 2 diabetes mellitus with diabetic neuropathy, unspecified: Secondary | ICD-10-CM

## 2016-12-10 DIAGNOSIS — I110 Hypertensive heart disease with heart failure: Secondary | ICD-10-CM

## 2016-12-10 NOTE — Progress Notes (Signed)
Location:  [place] Room Number: 24 Place of Service:  SNF (31) Provider: Mitsy Owen FNP-C   Blanchie Serve, MD  Patient Care Team: Blanchie Serve, MD as PCP - General (Internal Medicine) Carlena Bjornstad, MD (Cardiology) Earnie Larsson, MD (Neurosurgery) Rutherford Guys, MD (Ophthalmology) Renda Rolls, Jennefer Bravo, MD as Referring Physician (Dermatology) Alexis Frock, MD as Consulting Physician (Urology) Dorothy Spark, MD as Consulting Physician (Cardiology) Caprice Mccaffrey, Nelda Bucks, NP as Nurse Practitioner (Family Medicine)  Extended Emergency Contact Information Primary Emergency Contact: [place], [place] of [employer] Phone: [phone] Mobile Phone: [phone] Relation: Friend Secondary Emergency Contact: Ervin Knack of [place] Phone: [phone] Relation: Friend  Code Status: DNR Goals of care: Advanced Directive information Advanced Directives 12/10/2016  Does Patient Have a Medical Advance Directive? Yes  Type of Advance Directive Living will;Healthcare Power of [place];Out of facility DNR (pink MOST or yellow form)  Does patient want to make changes to medical advance directive? -  Copy of [place] in Chart? Yes  Would patient like information on creating a medical advance directive? -  Pre-existing out of facility DNR order (yellow form or pink MOST form) Yellow form placed in chart (order not valid for inpatient use)     Chief Complaint  Patient presents with  . Medical Management of Chronic Issues    routine visit    HPI:  Pt is a 83 y.o. male seen today [place] for medical management of chronic diseases.He has a medical history of HTN, Afib, CHF, Type 2 DM, Hyperlipidemia, BPH, Prostate CA, insomnia among other conditions. He is seen in his room today lying in bed. He states did not sleep well previous night.He states lower back pain under  control as long as he lies on his back and elevates legs on three pillows. No recent fall episodes reported.He has had no recent weight changes.He continues to require assistance with bladder incontinence. He states does not participate in the facility activities but states has friends visiting and walks at times on the facility hallways.Facility Nurse reports no new concerns.Reviewed CBG's log readings running in the low 100's. 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 Aortic arch noted to be fairly stable with a maximal diameter proximally of 5.5 cm and distally 4.8 cm compared to previous CTA.Due to advance patient's age and frail would not be a candidate for redo sternotomy for arch replacement or debraching and TEVAR. Dr. Cyndia Bent discussed results with patient and patient's friend.Patient in full agreement.MD recommended follow up as need arises.He has an upcoming appointment with Cardiology Dr. Ermalinda Barrios on 12/16/2016.   Past Medical History:  Diagnosis Date  . ACE-inhibitor cough   . Aortic dissection [facility])    Surgical repair February, 2014  . Aortic stenosis    mild....echo... september... 2010/ mild... echo.[name] KitchenMarland KitchenDec, 2011  . Atrial fibrillation (HCC)     Chronic,   24 hour holter, September, 2011.... atrial fib rate is controlled.... there is some bradycardia but  no marked pauses  . Bladder cancer [facility])    recurrence with TUR-B March '09  . DDD (degenerative disc disease)    lumbar spine  . Diabetes mellitus    type 2  . Diabetes mellitus  with neuropathy ([place]) 12/04/2006   Qualifier: Diagnosis of  By: Linda Hedges MD, Heinz Knuckles  medications - DPP4   . Diverticulosis of colon with hemorrhage 01/01/2014  . Ejection fraction    EF 60%, echo, 01/2010  . Fluid overload   . Fracture of metacarpal bone 11/30/11  . GERD  (gastroesophageal reflux disease)   . Gout   . History of echocardiogram    Echo 5/17 - mild LVH, EF 55-60%, mod AS (mean 16 mmHg, peak 29 mmHg), MAC, midl MR, massive BAE, mod dilated RV, low normal RVSF, mod TR, PASP 64 mmHg  . Hx of colonoscopy    approx. 10 years  . Hypercholesterolemia   . Hyperlipidemia   . Hypertension   . Mitral regurgitation   . Normal nuclear stress test    06/2005 , also ABI normal 2007  . Prostate cancer ([place])    radiation tx.  . Pulmonary hypertension ([place])    1mHg, echo, 01/2010  . Shortness of breath    on exertion  . Warfarin anticoagulation    Atrial fib  . Whooping cough    Past Surgical History:  Procedure Laterality Date  . ASCENDING AORTIC ROOT REPLACEMENT N/A 04/15/2012   Procedure: ASCENDING AORTIC ROOT REPLACEMENT;  Surgeon: BGaye Pollack MD;  Location: [facility]  Service: Open Heart Surgery;  Laterality: N/A;  . bladder cancer biopsy  10/16/2010   negative for malignancy  . bladder microscopic  04/13/2007   high grade papillary urothelial lesions  . COLONOSCOPY N/A 01/01/2014   Procedure: COLONOSCOPY;  Surgeon: CGatha Mayer MD;  Location: [facility];  Service: Endoscopy;  Laterality: N/A;  . CYSTOSCOPY/RETROGRADE/URETEROSCOPY Bilateral 04/04/2013   Procedure: CYSTOSCOPY WITH RETROGRADE PYELOGRAM AND BLADDER BIOPSY;  Surgeon: DMolli Hazard MD;  Location: [facility];  Service: Urology;  Laterality: Bilateral;  . 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 EXPLORATION POST OPERATIVE OPEN HEART  04/2012  . Herniatic disc surgery    . IR KYPHO LUMBAR INC FX REDUCE BONE BX UNI/BIL CANNULATION INC/IMAGING  07/25/2016  . LUMBAR LAMINECTOMY     '02  . PROSTATE BIOPSY  09/25/2011   GLEASON 3+3=6 AND 4+3=7  . TEE WITHOUT CARDIOVERSION N/A 04/15/2012   Procedure: TRANSESOPHAGEAL ECHOCARDIOGRAM (TEE);  Surgeon: BGaye Pollack MD;  [facility] [facility]  Service: Open Heart Surgery;  Laterality: N/A;  . TRANSURETHRAL  RESECTION OF BLADDER TUMOR N/A 06/13/2015   Procedure: TRANSURETHRAL RESECTION OF PROSTATE (TURP) CLOT EVACUATION AND FULGERATION, BLADDER STONE REMOVAL ;  Surgeon: TAlexis Frock MD;  Location: [facility];  Service: Urology;  Laterality: N/A;  . TUR-BT '06, '09      Allergies  Allergen Reactions  . Metoprolol Other (See Comments)    Reaction:  Bradycardia with 246mBID dose    Allergies as of 12/10/2016      Reactions   Metoprolol Other (See Comments)   Reaction:  Bradycardia with 2574mID dose      Medication List       Accurate as of 12/10/16  3:07 PM. Always use your most recent med list.          acetaminophen 500 MG tablet Commonly known as:  TYLENOL Take 500 mg by mouth every 8 (eight) hours as needed for mild pain, moderate pain, fever or headache.   allopurinol 300 MG tablet Commonly known as:  ZYLOPRIM take 1 tablet by mouth once daily   atorvastatin 10 MG tablet Commonly known as:  LIPITOR Take 10 mg by mouth daily.   CALCIUM 600+D 600-400 MG-UNIT tablet Generic drug:  Calcium Carbonate-Vitamin D Take 1 tablet by mouth at bedtime.   desonide 0.05 % lotion Commonly known as:  DESOWEN Apply 1 application topically daily as needed (rosacea).   docusate sodium 100 MG capsule Commonly known as:  COLACE Take 1 capsule (100 mg total) by mouth every 12 (twelve) hours.   finasteride 5 MG tablet Commonly known as:  PROSCAR Take 1 tablet (5 mg total) by mouth every morning.   furosemide 20 MG tablet Commonly known as:  LASIX Take 20 mg by mouth 2 (two) times daily. Hold for SBP <110   gabapentin 100 MG capsule Commonly known as:  NEURONTIN Take 1 capsule (100 mg total) by mouth 3 (three) times daily.   ketoconazole 2 % shampoo Commonly known as:  NIZORAL Apply 1 application topically as needed.   MYRBETRIQ 50 MG Tb24 tablet Generic drug:  mirabegron ER Take 50 mg by mouth daily.   omeprazole 20 MG capsule Commonly known as:  PRILOSEC Take 20 mg by  mouth daily.   polyethylene glycol packet Commonly known as:  MIRALAX / GLYCOLAX Take 17 g by mouth daily as needed.   potassium chloride 10 MEQ tablet Commonly known as:  K-DUR,KLOR-CON take 1 tablet by mouth once daily   senna 8.6 MG tablet Commonly known as:  SENOKOT Take 1 tablet by mouth at bedtime.   sitaGLIPtin 25 MG tablet Commonly known as:  JANUVIA Take 25 mg by mouth daily.   tamsulosin 0.4 MG Caps capsule Commonly known as:  FLOMAX Take 0.4 mg by mouth at bedtime.   THERAPEUTIC MULTIVITAMIN PO Take 1 tablet by mouth daily.   tiZANidine 4 MG tablet Commonly known as:  ZANAFLEX Take 1 tablet (4 mg total) by mouth every 6 (six) hours as needed for muscle spasms.   traMADol 50 MG tablet Commonly known as:  ULTRAM Take 1 tablet (50 mg total) by mouth every 6 (six) hours as needed for moderate pain.   traZODone 50 MG tablet Commonly known as:  DESYREL Take 50 mg by mouth at bedtime.   warfarin 2.5 MG tablet Commonly known as:  COUMADIN Take 2.5 mg by mouth daily.       Review of Systems  Constitutional: Negative for activity change, appetite change, chills, fatigue and fever.  HENT: Negative for congestion, dental problem, rhinorrhea, sinus pain, sinus pressure, sneezing, sore throat and trouble swallowing.   Eyes: Positive for visual disturbance. Negative for pain, discharge and redness.  Respiratory: Negative for cough, chest tightness, shortness of breath and wheezing.   Cardiovascular: Negative for chest pain, palpitations and leg swelling.  Gastrointestinal: Negative for abdominal distention, abdominal pain, constipation, diarrhea, nausea and vomiting.  Endocrine: Negative for cold intolerance, heat intolerance, polydipsia, polyphagia and polyuria.  Genitourinary: Negative for dysuria, flank pain and urgency.  Musculoskeletal: Positive for back pain and gait problem.  Skin: Negative for color change, pallor, rash and wound.  Neurological: Negative for  dizziness, seizures, syncope, light-headedness and headaches.  Psychiatric/Behavioral: Positive for sleep disturbance. Negative for agitation, confusion and hallucinations. The patient is not nervous/anxious.     Immunization History  Administered Date(s) Administered  . H1N1 03/07/2008  . Influenza Split 12/04/2010, 11/10/2011  . Influenza Whole 12/06/2007, 11/29/2008, 11/12/2009  . Influenza, High Dose Seasonal PF 12/02/2012  . Influenza,inj,Quad PF,6+ Mos 10/26/2013  . Influenza-Unspecified 11/10/2014, 12/04/2015  . Pneumococcal Conjugate-13 04/11/2013  . Pneumococcal Polysaccharide-23 06/03/2006  .  Td 03/07/2008  . Zoster 11/14/2009   Pertinent  Health Maintenance Due  Topic Date Due  . INFLUENZA VACCINE  10/01/2016  . OPHTHALMOLOGY EXAM  03/03/2017 (Originally 11/30/2015)  . URINE MICROALBUMIN  03/03/2017 (Originally 04/09/2016)  . FOOT EXAM  01/14/2017  . HEMOGLOBIN A1C  01/30/2017  . PNA vac Low Risk Adult  Completed   Fall Risk  07/07/2016 01/15/2016 01/04/2016 10/19/2015 04/10/2015  Falls in the past year? No Yes Yes No No  Number falls in past yr: - 1 1 - -  Comment - - - - -  Injury with Fall? - No No - -  Comment - - - - -  Risk for fall due to : - Impaired balance/gait;Impaired mobility - - -  Follow up - Falls prevention discussed - - -    Vitals:   12/10/16 1118  BP: (!) 146/76  Pulse: 77  Resp: (!) 21  Temp: 98.2 F (36.8 C)  SpO2: 97%  Weight: 185 lb (83.9 kg)  Height: _0  (1.727 m)   Body mass index is 28.13 kg/m. Physical Exam  Constitutional: He is oriented to person, place, and time. He appears well-developed and well-nourished.  HENT:  Head: Normocephalic.  Right Ear: External ear normal.  Left Ear: External ear normal.  Mouth/Throat: Oropharynx is clear and moist. No oropharyngeal exudate.  Eyes: Pupils are equal, round, and reactive to light. Conjunctivae and EOM are normal. Right eye exhibits no discharge. Left eye exhibits no discharge. No  scleral icterus.  Neck: Normal range of motion. No JVD present. No thyromegaly present.  Cardiovascular: Intact distal pulses.  Exam reveals no gallop and no friction rub.   Murmur heard. Irregular Heart rate   Pulmonary/Chest: Effort normal and breath sounds normal. No respiratory distress. He has no wheezes. He has no rales.  Abdominal: Soft. Bowel sounds are normal. He exhibits no distension. There is no tenderness. There is no rebound and no guarding.  Genitourinary:  Genitourinary Comments: Incontinent for bladder   Musculoskeletal: He exhibits no tenderness.  Moves x 4 extremities.Bilateral lower extremities trace edema.Brown skin discoloration on lower extremities.   Lymphadenopathy:    He has no cervical adenopathy.  Neurological: He is oriented to person, place, and time. Coordination normal.  Skin: Skin is warm and dry. No rash noted. No pallor.  Sacral/gluteal skin redness but blanchable.   Psychiatric: He has a normal mood and affect.    Labs reviewed:  Recent Labs  07/22/16 0531  07/26/16 0537  07/27/16 0511  07/28/16 0520 08/04/16 10/06/16  NA 137  < > 139  < > 138  < > 140 138 141  K 3.6  < > 3.5  < > 3.8  --  3.7 4.1 3.7  CL 99*  < > 100*  --  99*  --  99*  --   --   CO2 30  < > 28  --  30  --  30  --   --   GLUCOSE 126*  < > 112*  --  114*  --  117*  --   --   BUN 15  < > 22*  < > 24*  < > 24* 17 13  CREATININE 0.93  < > 0.77  < > 1.01  < > 0.97 0.9 1.2  CALCIUM 9.2  < > 9.7  --  9.5  --  9.7  --   --   MG 1.7  --  1.7  --   --   --  1.7  --   --   < > = values in this interval not displayed.  Recent Labs  04/07/16 1311 07/07/16 1725 07/10/16 1546  AST 21 25 38  ALT _0 ALKPHOS 96 80 81  BILITOT 1.49* 0.9 1.8*  PROT 8.0 7.2 7.9  ALBUMIN 4.2 4.4 4.4    Recent Labs  07/10/16 1546  07/20/16 0505  07/25/16 0527  07/26/16 0537  07/27/16 0511 07/28/16 07/28/16 0520 08/04/16  WBC 15.9*  < > 12.2*  < > 11.8*  < > 9.9  < > 10.0 9.1 9.1 9.8    NEUTROABS 13.6*  --  9.7*  --  10.4*  --   --   --   --   --   --   --   HGB 14.3  < > 13.2  < > 12.7*  < > 13.0  < > 14.0  --  14.0 13.7  HCT 41.7  < > 40.6  < > 38.6*  < > 39.6  < > 44.1  --  42.5 42  MCV 92.3  < > 95.3  < > 94.8  --  94.3  --  95.2  --  95.3  --   PLT 252  < > 197  < > 216  < > 207  < > 225  --  227 241  < > = values in this interval not displayed. Lab Results  Component Value Date   TSH 1.48 04/10/2015   Lab Results  Component Value Date   HGBA1C 5.9 07/31/2016   Lab Results  Component Value Date   CHOL 91 10/06/2016   HDL 30 (A) 10/06/2016   LDLCALC 42 10/06/2016   TRIG 107 10/06/2016   CHOLHDL 2 04/10/2015    Significant Diagnostic Results in last 30 days:  Ct Angio Chest Aorta W/cm &/or Wo/cm  Result Date: 11/26/2016 CLINICAL DATA:  83 year old male status post graft repair of the ascending thoracic aorta. Evaluation for aortic root replacement. EXAM: CT ANGIOGRAPHY CHEST WITH CONTRAST TECHNIQUE: Multidetector CT imaging of the chest was performed using the standard protocol during bolus administration of intravenous contrast. Multiplanar CT image reconstructions and MIPs were obtained to evaluate the vascular anatomy. CONTRAST:  75 mL of Isovue 370. COMPARISON:  Chest CT 11/21/2015. FINDINGS: Creatinine was obtained on site at [place] [facility] at [address] Results: Creatinine 0.9 mg/dL. Cardiovascular: Heart size is enlarged with biatrial dilatation. Small filling defect in the tip of the left atrial appendage (axial image 59 of series 4), highly concerning for left atrial appendage thrombus. There is no significant pericardial fluid, thickening or pericardial calcification. There is aortic atherosclerosis, as well as atherosclerosis of the great vessels of the mediastinum and the coronary arteries, including calcified atherosclerotic plaque in the left main, left anterior descending, left circumflex and right coronary arteries. Severe thickening and  calcification of the aortic valve. Status post median sternotomy for graft repair of the ascending thoracic aorta. Aneurysmal dilatation of the proximal aortic arch which measures up to 5.5 cm in diameter, similar to prior study 11/21/2015 when measured in a similar fashion (outer diameter of the vessel as measured on image 34 of series 4 of the prior examination). Chronic dissection of the thoracic aorta involving the arch and descending thoracic aorta extending into the upper abdominal aorta again noted. Compared to the prior study aneurysmal dilatation of the distal aortic arch (4.9 cm in diameter) has slightly increased (previously 4.6 cm in diameter). The  false lumen does not opacify with IV contrast in the thorax. Mediastinum/Nodes: No pathologically enlarged mediastinal or hilar lymph nodes. Esophagus is unremarkable in appearance. No axillary lymphadenopathy. Lungs/Pleura: Mild scarring in the inferior segment of the lingula. Several tiny perifissural nodules measuring 4 mm or less in size are noted, considered benign. No other larger more suspicious appearing pulmonary nodules or masses are noted. No acute consolidative airspace disease. No pleural effusions. Upper Abdomen: Liver has a shrunken appearance and nodular contour, indicative of cirrhosis. Multiple calcified gallstones lie dependently in the gallbladder. No findings to suggest an acute cholecystitis at this time. Aortic atherosclerosis with chronic dissection of the super renal, renal and infrarenal abdominal aorta. The dissection flap extends into the right renal artery. Left renal artery is fed by the true lumen. Celiac axis, superior mesenteric artery and inferior mesenteric artery all appear to arise from the false lumen which is patent in the abdomen. 2.3 cm low-attenuation lesion in the upper pole the right kidney is compatible with a cyst. Musculoskeletal: Median sternotomy wires. There are no aggressive appearing lytic or blastic lesions  noted in the visualized portions of the skeleton. New compression fracture of L2 with approximately 50% loss of anterior vertebral body height and post vertebroplasty changes. There are no aggressive appearing lytic or blastic lesions noted in the visualized portions of the skeleton. Review of the MIP images confirms the above findings. IMPRESSION: 1. Status post graft repair of the ascending thoracic aorta, with persistent aneurysmal dilatation and chronic dissection of the aortic arch. The aortic dissection extends into the abdomen, as detailed above. The aortic arch is generally stable in size, with exception of the distal arch which is increased 4.6 cm in diameter on the prior study 11/21/2015 at 4.8 cm in diameter on today's examination. Maximal diameter of the aortic arch is proximally measuring up to 5.5 cm. 2. Severe thickening calcification of the aortic valve. 3. Aortic atherosclerosis, in addition to left main and 3 vessel coronary artery disease. 4. Cardiomegaly with biatrial dilatation. Importantly, there is a filling defect in the tip of the left atrial appendage which is highly concerning for left atrial appendage thrombus. Correlation with transesophageal echocardiography is suggested if clinically appropriate, as this may place the patient at risk for systemic embolization. 5. Morphologic changes in the liver indicative of cirrhosis. 6. Cholelithiasis without evidence to suggest an acute cholecystitis at this time. 7. Additional incidental findings, as above. These results will be called to the ordering clinician or representative by the Radiologist Assistant, and communication documented in the PACS or zVision Dashboard. Aortic Atherosclerosis (ICD10-I70.0) and Aortic aneurysm NOS (ICD10-I71.9). Electronically Signed   By: Vinnie Langton M.D.   On: 11/26/2016 14:06   Assessment/Plan 1. Hypertensive heart disease with chronic diastolic congestive heart failure B/p readings ranging  130's/80's-140's/70's.continue on Furosemide 20 mg tablet twice daily. Microalbumin 253 (11/11/2016) will add lisinopril 5 mg tablet daily for renal protection.continue to monitor blood pressure twice daily.      2. Chronic diastolic CHF (congestive heart failure) Appears compensated.trace edema to lower extremities.Weight stable. Continue on Furosemide 20 mg Tablet twice daily.Continue to monitor weight.     3. Chronic atrial fibrillation HR irregular.Continue on coumadin 2.5 mg tablet daily. Recent INR 2.4 will recheck in one week.   4. Type 2 diabetes mellitus with diabetic neuropathy, without long-term current use of insulin  Recent Hgb A1C 5.2 (11/13/2016). CBG readings ranging in low 100's. Will discontinue Januvia 25 mg tablet to prevent hypoglycemia.continue to monitor  CBG's weekly notify provider if CBG's > 200. Recent Microalbumin 253 ( 11/11/2016) previous B/P readings were soft low but B/P readings now ranging in the 130's/80's-140's/70's. Will add low dose ACE inhibitor as above for renal protection. Continue on Statin.Recheck Hgb A1C in 3 months.     5. Mixed hyperlipidemia LDL at goal. Continue on atorvastatin 10 mg tablet daily. Monitor Lipid panel.    Family/ staff Communication: Reviewed plan of care with patient and facility Nurse.   Labs/tests ordered: Hgb A1C in 3 months.  Sandrea Hughs, NP

## 2016-12-12 DIAGNOSIS — L602 Onychogryphosis: Secondary | ICD-10-CM | POA: Diagnosis not present

## 2016-12-12 DIAGNOSIS — M2041 Other hammer toe(s) (acquired), right foot: Secondary | ICD-10-CM | POA: Diagnosis not present

## 2016-12-12 DIAGNOSIS — L84 Corns and callosities: Secondary | ICD-10-CM | POA: Diagnosis not present

## 2016-12-12 DIAGNOSIS — E119 Type 2 diabetes mellitus without complications: Secondary | ICD-10-CM | POA: Diagnosis not present

## 2016-12-16 ENCOUNTER — Ambulatory Visit (INDEPENDENT_AMBULATORY_CARE_PROVIDER_SITE_OTHER): Payer: Medicare Other | Admitting: Physician Assistant

## 2016-12-16 ENCOUNTER — Encounter: Payer: Self-pay | Admitting: Physician Assistant

## 2016-12-16 VITALS — BP 136/74 | HR 55 | Resp 16 | Ht 68.0 in | Wt 186.8 lb

## 2016-12-16 DIAGNOSIS — I350 Nonrheumatic aortic (valve) stenosis: Secondary | ICD-10-CM | POA: Diagnosis not present

## 2016-12-16 DIAGNOSIS — I482 Chronic atrial fibrillation, unspecified: Secondary | ICD-10-CM

## 2016-12-16 DIAGNOSIS — I110 Hypertensive heart disease with heart failure: Secondary | ICD-10-CM

## 2016-12-16 DIAGNOSIS — I5032 Chronic diastolic (congestive) heart failure: Secondary | ICD-10-CM

## 2016-12-16 DIAGNOSIS — I7101 Dissection of ascending aorta: Secondary | ICD-10-CM

## 2016-12-16 NOTE — Progress Notes (Signed)
Cardiology Office Note    Date:  12/16/2016   ID:  Eric Rivers, DOB August 06, 1930, MRN [id number]  PCP:  Blanchie Serve, MD  Cardiologist: Eric Rivers  Chief Complaint  Patient presents with  . Follow-up    History of Present Illness:  Eric Rivers is a 83 y.o. 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 Last 2-D echo 07/2015 moderate aortic stenosis, normal LVEF 55-60%. With AVA 1.1 cm and massively dilated left atrium. Last saw Eric Rivers 05/2016 and was doing well.  Patient comes in today accompanied by a friend. Overall he is doing well. He can walk about 100 feet but has to stop to catch his breath. This is unchanged from before. His friend actually feels like he is doing much better since he is at friends home. He is in a wheelchair for the most part and walks with a walker and assistance. Denies chest pain, edema, rapid heart rates.    Past Medical History:  Diagnosis Date  . ACE-inhibitor cough   . Aortic dissection [facility])    Surgical repair February, 2014  . Aortic stenosis    mild....echo... september... 2010/ mild... echo.[name] KitchenMarland KitchenDec, 2011  . Atrial fibrillation (HCC)     Chronic,   24 hour holter, September, 2011.... atrial fib rate is controlled.... there is some bradycardia but  no marked pauses  . Bladder cancer [facility])    recurrence with TUR-B March '09  . DDD (degenerative disc disease)    lumbar spine  . Diabetes mellitus    type 2  . Diabetes mellitus with neuropathy ([place]) 12/04/2006   Qualifier: Diagnosis of  By: Linda Hedges MD, Heinz Knuckles  medications - DPP4   . Diverticulosis of colon with hemorrhage 01/01/2014  . Ejection fraction    EF 60%,  echo, 01/2010  . Fluid overload   . Fracture of metacarpal bone 11/30/11  . GERD (gastroesophageal reflux disease)   . Gout   . History of echocardiogram    Echo 5/17 - mild LVH, EF 55-60%, mod AS (mean 16 mmHg, peak 29 mmHg), MAC, midl MR, massive BAE, mod dilated RV, low normal RVSF, mod TR, PASP 64 mmHg  . Hx of colonoscopy    approx. 10 years  . Hypercholesterolemia   . Hyperlipidemia   . Hypertension   . Mitral regurgitation   . Normal nuclear stress test    06/2005 , also ABI normal 2007  . Prostate cancer ([place])    radiation tx.  . Pulmonary hypertension ([place])    65mHg, echo, 01/2010  . Shortness of breath    on exertion  . Warfarin anticoagulation    Atrial fib  . Whooping cough     Past Surgical History:  Procedure Laterality Date  . ASCENDING AORTIC ROOT REPLACEMENT N/A 04/15/2012   Procedure: ASCENDING AORTIC ROOT REPLACEMENT;  Surgeon: BGaye Pollack MD;  [facility]  Service: Open Heart Surgery;  Laterality: N/A;  . bladder cancer biopsy  10/16/2010   negative for malignancy  . bladder microscopic  04/13/2007   high grade papillary urothelial lesions  . COLONOSCOPY N/A 01/01/2014   Procedure: COLONOSCOPY;  Surgeon: CGatha Mayer MD;  Location: [facility];  Service: Endoscopy;  Laterality: N/A;  . CYSTOSCOPY/RETROGRADE/URETEROSCOPY Bilateral 04/04/2013   Procedure: CYSTOSCOPY WITH RETROGRADE PYELOGRAM AND BLADDER BIOPSY;  Surgeon: Molli Hazard, MD;  Location: [facility];  Service: Urology;  Laterality: Bilateral;  . 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 EXPLORATION POST OPERATIVE OPEN HEART  04/2012  . Herniatic disc surgery    . IR KYPHO LUMBAR INC FX REDUCE BONE BX UNI/BIL CANNULATION INC/IMAGING  07/25/2016  . LUMBAR LAMINECTOMY     '02  . PROSTATE BIOPSY  09/25/2011   GLEASON 3+3=6 AND 4+3=7  . TEE WITHOUT CARDIOVERSION N/A 04/15/2012   Procedure: TRANSESOPHAGEAL ECHOCARDIOGRAM (TEE);  Surgeon: Gaye Pollack,  MD;  Location: [facility];  Service: Open Heart Surgery;  Laterality: N/A;  . TRANSURETHRAL RESECTION OF BLADDER TUMOR N/A 06/13/2015   Procedure: TRANSURETHRAL RESECTION OF PROSTATE (TURP) CLOT EVACUATION AND FULGERATION, BLADDER STONE REMOVAL ;  Surgeon: Alexis Frock, MD;  Location: [facility];  Service: Urology;  Laterality: N/A;  . TUR-BT '06, '09      Current Medications: Current Meds  Medication Sig  . acetaminophen (TYLENOL) 500 MG tablet Take 500 mg by mouth every 8 (eight) hours as needed for mild pain, moderate pain, fever or headache.   . allopurinol (ZYLOPRIM) 300 MG tablet take 1 tablet by mouth once daily  . atorvastatin (LIPITOR) 10 MG tablet Take 10 mg by mouth daily.  . Calcium Carbonate-Vitamin D (CALCIUM 600+D) 600-400 MG-UNIT tablet Take 1 tablet by mouth at bedtime.  [name] Kitchen desonide (DESOWEN) 0.05 % lotion Apply 1 application topically daily as needed (rosacea).   . docusate sodium (COLACE) 100 MG capsule Take 1 capsule (100 mg total) by mouth every 12 (twelve) hours.  . finasteride (PROSCAR) 5 MG tablet Take 1 tablet (5 mg total) by mouth every morning.  . furosemide (LASIX) 20 MG tablet Take 20 mg by mouth 2 (two) times daily. Hold for SBP <110  . gabapentin (NEURONTIN) 100 MG capsule Take 1 capsule (100 mg total) by mouth 3 (three) times daily.  [name] Kitchen ketoconazole (NIZORAL) 2 % shampoo Apply 1 application topically as needed.   . mirabegron ER (MYRBETRIQ) 50 MG TB24 tablet Take 50 mg by mouth daily.  . Multiple Vitamin (THERAPEUTIC MULTIVITAMIN PO) Take 1 tablet by mouth daily.  [name] Kitchen omeprazole (PRILOSEC) 20 MG capsule Take 20 mg by mouth daily.  . polyethylene glycol (MIRALAX / GLYCOLAX) packet Take 17 g by mouth daily as needed.  . potassium chloride (K-DUR,KLOR-CON) 10 MEQ tablet take 1 tablet by mouth once daily  . senna (SENOKOT) 8.6 MG tablet Take 1 tablet by mouth at bedtime.   . sitaGLIPtin (JANUVIA) 25 MG tablet Take 25 mg by mouth daily.  . Tamsulosin HCl (FLOMAX) 0.4 MG CAPS  Take 0.4 mg by mouth at bedtime.   [name] Kitchen tiZANidine (ZANAFLEX) 4 MG tablet Take 1 tablet (4 mg total) by mouth every 6 (six) hours as needed for muscle spasms. (Patient taking differently: Take 4 mg by mouth every 8 (eight) hours as needed for muscle spasms. )  . traMADol (ULTRAM) 50 MG tablet Take 1 tablet (50 mg total) by mouth every 6 (six) hours as needed for moderate pain.  . traZODone (DESYREL) 50 MG tablet Take 50 mg by mouth at bedtime.  [name] Kitchen warfarin (COUMADIN) 2.5 MG tablet Take 2.5 mg by mouth daily.     Allergies:   Metoprolol   Social History   Social History  . Marital status: Single    Spouse  name: N/A  . Number of children: 0  . Years of education: 39   Occupational History  . history and Advice worker Retired    retired   Social History Main Topics  . Smoking status: Former Smoker    Packs/day: 1.00    Types: Cigarettes    Quit date: 03/04/1975  . Smokeless tobacco: Never Used  . Alcohol use No  . Drug use: No  . Sexual activity: Not Currently   Other Topics Concern  . None   Social History Narrative   Confirmed batchelor. Retired Education officer, museum. World traveler- Ambulance person. I- ADLS. End of life Care   No CPR, no prolonged intubation, no prolonged artificial hydration or feeding, no heroic or futile measures.       Next trip Sept [place]13 [place]      Last cruise - two cruises back to back to the [place] in January '15. May'15: [place] with stops in [place], [place], [place], [place], [place]. 15 days      Fall '15: 50 Days from [place]. [place], [place], [place], [place], [place], [place], [place], [place], [place].       May '15 - 15 days up the [employer] as far as [place]              Family History:  The patient's family history includes Cancer in his father; Heart attack in his mother; Heart disease in his mother; Hypertension in his mother; Prostate cancer (age of  onset: 25) in his father; Stroke in his father.   ROS:   Please see the history of present illness.    Review of Systems  Cardiovascular: Positive for dyspnea on exertion and irregular heartbeat.  Hematologic/Lymphatic: Bruises/bleeds easily.  Musculoskeletal: Positive for back pain.  Gastrointestinal: Positive for diarrhea.  Genitourinary: Positive for bladder incontinence.  Neurological: Positive for loss of balance.   All other systems reviewed and are negative.   PHYSICAL EXAM:   VS:  BP 136/74   Pulse (!) 55   Resp 16   Ht _0  (1.727 m)   Wt 186 lb 12.8 oz (84.7 kg)   SpO2 98%   BMI 28.40 kg/m   Physical Exam  GEN: Well nourished, well developed, in no acute distress  Neck: no JVD, carotid bruits, or masses Cardiac:Irregular irregular with 2/6 systolic harsh murmur at the left sternal border Respiratory:  clear to auscultation bilaterally, normal work of breathing GI: soft, nontender, nondistended, + BS Ext: without cyanosis, clubbing, or edema, Good distal pulses bilaterally Neuro:  Alert and Oriented x 3,  Psych: euthymic mood, full affect  Wt Readings from Last 3 Encounters:  12/16/16 186 lb 12.8 oz (84.7 kg)  12/10/16 185 lb (83.9 kg)  11/26/16 186 lb (84.4 kg)      Studies/Labs Reviewed:   EKG:  EKG is not ordered today.    Recent Labs: 07/10/2016: ALT 27 07/28/2016: Magnesium 1.7 08/04/2016: Hemoglobin 13.7; Platelets 241 10/06/2016: BUN 13; Creatinine 1.2; Potassium 3.7; Sodium 141   Lipid Panel    Component Value Date/Time   CHOL 91 10/06/2016   TRIG 107 10/06/2016   HDL 30 (A) 10/06/2016   CHOLHDL 2 04/10/2015 1230   VLDL 11.4 04/10/2015 1230   LDLCALC 42 10/06/2016    Additional studies/ records that were reviewed today include:  2-D echo 07/03/15 Study Conclusions   - Left ventricle: The cavity size was normal. Wall thickness  was   increased in a pattern of mild LVH. Systolic function was normal.   The estimated ejection fraction was in the  range of 55% to 60%.   The study is not technically sufficient to allow evaluation of LV   diastolic function. - Aortic valve: Mildly calcified leaflets. Moderate stenosis. Mean   gradient (S): 16 mm Hg. Peak gradient (S): 29 mm Hg. Valve area   (VTI): 1.09 cm^2. Valve area (Vmax): 1.2 cm^2. - Mitral valve: Calcified annulus. Mildly thickened leaflets .   There was mild regurgitation. - Left atrium: Massively dilated at 71 ml/m2. - Right ventricle: The cavity size was moderately dilated. Systolic   function is low normal. - Right atrium: Massively dilated at 50 cm2. - Tricuspid valve: There was moderate regurgitation. - Pulmonary arteries: PA peak pressure: 64 mm Hg (S). - Inferior vena cava: The vessel was dilated. The respirophasic   diameter changes were blunted (< 50%), consistent with elevated   central venous pressure.   Impressions:   - Compared to a prior echo in 2015, there is now moderate aortic   stenosis with AVA around 1.1 cm2.      ASSESSMENT:    1. Chronic atrial fibrillation ([place])   2. Nonrheumatic aortic valve stenosis   3. Chronic diastolic CHF (congestive heart failure) ([place])   4. Ascending aortic dissection ([place])   5. Hypertensive heart disease with chronic diastolic congestive heart failure (HCC)      PLAN:  In order of problems listed above:  Chronic atrial fibrillation on CoumadinAnd rate controlled without rate lowering medications  Nonrheumatic aortic valve stenosis moderate on echo 07/2015 patient has dyspnea on exertion with walking short distances but this has not changed.  Chronic diastolic CHF with normal LV function 07/2015 well compensated  Ascending aortic dissection followed by Dr. Cyndia Bent 5.5 x 4.8 not felt to be a surgical candidate. Good blood pressure control is necessary.  Hypertensive heart disease with chronic diastolic CHF blood pressure stable.    Medication Adjustments/Labs and Tests Ordered: Current medicines are reviewed  at length with the patient today.  Concerns regarding medicines are outlined above.  Medication changes, Labs and Tests ordered today are listed in the Patient Instructions below. There are no Patient Instructions on file for this visit.   Signed, Ermalinda Barrios, PA-C  12/16/2016 11:14 AM    [facility] [address], [place], [place]  [address] Phone: [phone]; Fax: [phone]

## 2016-12-16 NOTE — Patient Instructions (Signed)
Your physician recommends that you continue on your current medications as directed. Please refer to the Current Medication list given to you today. Your physician wants you to follow-up in: March, 2019 with Dr. Meda Coffee. You will receive a reminder letter in the mail two months in advance. If you don't receive a letter, please call our office to schedule the follow-up appointment.

## 2016-12-23 ENCOUNTER — Non-Acute Institutional Stay (SKILLED_NURSING_FACILITY): Payer: Medicare Other | Admitting: Family

## 2016-12-23 ENCOUNTER — Encounter: Payer: Self-pay | Admitting: Family

## 2016-12-23 DIAGNOSIS — R399 Unspecified symptoms and signs involving the genitourinary system: Secondary | ICD-10-CM | POA: Diagnosis not present

## 2016-12-23 DIAGNOSIS — G4700 Insomnia, unspecified: Secondary | ICD-10-CM

## 2016-12-23 NOTE — Progress Notes (Signed)
Location:  [place] Room Number: 24 Place of Service:  SNF (31) Provider: Lateefah Mallery FNP-C  Blanchie Serve, MD  Patient Care Team: Blanchie Serve, MD as PCP - General (Internal Medicine) Carlena Bjornstad, MD (Cardiology) Earnie Larsson, MD (Neurosurgery) Rutherford Guys, MD (Ophthalmology) Renda Rolls, Jennefer Bravo, MD as Referring Physician (Dermatology) Alexis Frock, MD as Consulting Physician (Urology) Dorothy Spark, MD as Consulting Physician (Cardiology) Charletha Dalpe, Nelda Bucks, NP as Nurse Practitioner (Family Medicine)  Extended Emergency Contact Information Primary Emergency Contact: [place], [place] Phone: [phone] Mobile Phone: [phone] Relation: Friend Secondary Emergency Contact: Ervin Knack of [place] Phone: [phone] Relation: Friend  Code Status:  DNR Goals of care: Advanced Directive information Advanced Directives 12/23/2016  Does Patient Have a Medical Advance Directive? Yes  Type of Advance Directive Out of facility DNR (pink MOST or yellow form);Living will;Healthcare Power of Attorney  Does patient want to make changes to medical advance directive? -  Copy of [place] in Chart? Yes  Would patient like information on creating a medical advance directive? -  Pre-existing out of facility DNR order (yellow form or pink MOST form) Yellow form placed in chart (order not valid for inpatient use)     Chief Complaint  Patient presents with  . Acute Visit    burning with urination    HPI:  Pt is a 83 y.o. male seen today at [facility] for an acute visit for evaluation of burning with urination.He is seen in his room today per facility Nurse report.He states having burning sensation in the urethra every time he urinate.He has also noticed small amounts of blood in the urine.He denies any urinary frequency, urgency, abdominal pain or  abnormal weight loss.of note he has a significant medical history of bladder cancer.He was also seen by cardiologist Dr. Ermalinda Barrios 12/16/2016 follow up CT Angio Chest Aorta ascending aortic dissection noted patient to continue to  Follow up with Dr. Cyndia Bent for distal arch  5.5 x 4.8 not felt to be a surgical candidate.Good blood pressure control recommended.  Past Medical History:  Diagnosis Date  . ACE-inhibitor cough   . Aortic dissection [facility])    Surgical repair February, 2014  . Aortic stenosis    mild....echo... september... 2010/ mild... echo.[name] KitchenMarland KitchenDec, 2011  . Atrial fibrillation (HCC)     Chronic,   24 hour holter, September, 2011.... atrial fib rate is controlled.... there is some bradycardia but  no marked pauses  . Bladder cancer [facility])    recurrence with TUR-B March '09  . DDD (degenerative disc disease)    lumbar spine  . Diabetes mellitus    type 2  . Diabetes mellitus with neuropathy ([place]) 12/04/2006   Qualifier: Diagnosis of  By: Linda Hedges MD, Heinz Knuckles  medications - DPP4   . Diverticulosis of colon with hemorrhage 01/01/2014  . Ejection fraction    EF 60%, echo, 01/2010  . Fluid overload   . Fracture of metacarpal bone 11/30/11  . GERD (gastroesophageal reflux disease)   . Gout   . History of echocardiogram    Echo 5/17 - mild LVH, EF 55-60%, mod AS (mean 16 mmHg, peak 29 mmHg), MAC, midl MR, massive BAE, mod dilated RV, low normal RVSF, mod TR, PASP 64 mmHg  . Hx of colonoscopy    approx. 10 years  . Hypercholesterolemia   . Hyperlipidemia   .  Hypertension   . Mitral regurgitation   . Normal nuclear stress test    06/2005 , also ABI normal 2007  . Prostate cancer ([place])    radiation tx.  . Pulmonary hypertension ([place])    74mHg, echo, 01/2010  . Shortness of breath    on exertion  . Warfarin anticoagulation    Atrial fib  . Whooping cough    Past Surgical History:  Procedure Laterality Date  . ASCENDING AORTIC ROOT REPLACEMENT N/A 04/15/2012   Procedure:  ASCENDING AORTIC ROOT REPLACEMENT;  Surgeon: BGaye Pollack MD;  [place]  Service: Open Heart Surgery;  Laterality: N/A;  . bladder cancer biopsy  10/16/2010   negative for malignancy  . bladder microscopic  04/13/2007   high grade papillary urothelial lesions  . COLONOSCOPY N/A 01/01/2014   Procedure: COLONOSCOPY;  Surgeon: CGatha Mayer MD;  Location: [facility];  Service: Endoscopy;  Laterality: N/A;  . CYSTOSCOPY/RETROGRADE/URETEROSCOPY Bilateral 04/04/2013   Procedure: CYSTOSCOPY WITH RETROGRADE PYELOGRAM AND BLADDER BIOPSY;  Surgeon: DMolli Hazard MD;  Location: [facility];  Service: Urology;  Laterality: Bilateral;  . 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 EXPLORATION POST OPERATIVE OPEN HEART  04/2012  . Herniatic disc surgery    . IR KYPHO LUMBAR INC FX REDUCE BONE BX UNI/BIL CANNULATION INC/IMAGING  07/25/2016  . LUMBAR LAMINECTOMY     '02  . PROSTATE BIOPSY  09/25/2011   GLEASON 3+3=6 AND 4+3=7  . TEE WITHOUT CARDIOVERSION N/A 04/15/2012   Procedure: TRANSESOPHAGEAL ECHOCARDIOGRAM (TEE);  Surgeon: BGaye Pollack MD;  [place]  Service: Open Heart Surgery;  Laterality: N/A;  . TRANSURETHRAL RESECTION OF BLADDER TUMOR N/A 06/13/2015   Procedure: TRANSURETHRAL RESECTION OF PROSTATE (TURP) CLOT EVACUATION AND FULGERATION, BLADDER STONE REMOVAL ;  Surgeon: TAlexis Frock MD;  Location: [facility];  Service: Urology;  Laterality: N/A;  . TUR-BT '06, '09      Allergies  Allergen Reactions  . Metoprolol Other (See Comments)    Reaction:  Bradycardia with 261mBID dose    Outpatient Encounter Prescriptions as of 12/23/2016  Medication Sig  . acetaminophen (TYLENOL) 500 MG tablet Take 500 mg by mouth every 8 (eight) hours as needed for mild pain, moderate pain, fever or headache.   . allopurinol (ZYLOPRIM) 300 MG tablet take 1 tablet by mouth once daily  . atorvastatin (LIPITOR) 10 MG tablet Take 10 mg by mouth daily.  . Calcium  Carbonate-Vitamin D (CALCIUM 600+D) 600-400 MG-UNIT tablet Take 1 tablet by mouth at bedtime.  . [name] Kitchenesonide (DESOWEN) 0.05 % lotion Apply 1 application topically daily as needed (rosacea).   . docusate sodium (COLACE) 100 MG capsule Take 1 capsule (100 mg total) by mouth every 12 (twelve) hours.  . finasteride (PROSCAR) 5 MG tablet Take 1 tablet (5 mg total) by mouth every morning.  . furosemide (LASIX) 20 MG tablet Take 20 mg by mouth 2 (two) times daily. Hold for SBP <110  . gabapentin (NEURONTIN) 100 MG capsule Take 1 capsule (100 mg total) by mouth 3 (three) times daily.  . [name] Kitchenetoconazole (NIZORAL) 2 % shampoo Apply 1 application topically as needed.   . [name] Kitchenisinopril (PRINIVIL,ZESTRIL) 5 MG tablet Take 5 mg by mouth daily.  . mirabegron ER (MYRBETRIQ) 50 MG TB24 tablet Take 50 mg by mouth daily.  . Multiple Vitamin (THERAPEUTIC MULTIVITAMIN PO) Take 1 tablet by mouth daily.  . [name] Kitchenmeprazole (PRILOSEC) 20 MG capsule Take 20 mg by  mouth daily.  . polyethylene glycol (MIRALAX / GLYCOLAX) packet Take 17 g by mouth daily as needed.  . potassium chloride (K-DUR,KLOR-CON) 10 MEQ tablet take 1 tablet by mouth once daily  . senna (SENOKOT) 8.6 MG tablet Take 1 tablet by mouth at bedtime.   . sitaGLIPtin (JANUVIA) 25 MG tablet Take 25 mg by mouth daily.  . Tamsulosin HCl (FLOMAX) 0.4 MG CAPS Take 0.4 mg by mouth at bedtime.   [name] Kitchen tiZANidine (ZANAFLEX) 4 MG tablet Take 1 tablet (4 mg total) by mouth every 6 (six) hours as needed for muscle spasms. (Patient taking differently: Take 4 mg by mouth every 8 (eight) hours as needed for muscle spasms. )  . traMADol (ULTRAM) 50 MG tablet Take 1 tablet (50 mg total) by mouth every 6 (six) hours as needed for moderate pain.  . traZODone (DESYREL) 50 MG tablet Take 50 mg by mouth at bedtime.  [name] Kitchen warfarin (COUMADIN) 2.5 MG tablet Take 2.5 mg by mouth daily.   No facility-administered encounter medications on file as of 12/23/2016.     Review of Systems  Constitutional:  Negative for activity change, appetite change, chills, fatigue, fever and unexpected weight change.  Respiratory: Negative for cough, chest tightness, shortness of breath and wheezing.   Cardiovascular: Negative for chest pain, palpitations and leg swelling.  Gastrointestinal: Negative for abdominal distention, abdominal pain, constipation, diarrhea, nausea and vomiting.  Genitourinary: Positive for dysuria. Negative for frequency and urgency.       Incontinent for bladder  Skin: Negative for color change, pallor, rash and wound.  Hematological: Does not bruise/bleed easily.  Psychiatric/Behavioral: Negative for agitation, confusion and sleep disturbance. The patient is not nervous/anxious.     Immunization History  Administered Date(s) Administered  . H1N1 03/07/2008  . Influenza Split 12/04/2010, 11/10/2011  . Influenza Whole 12/06/2007, 11/29/2008, 11/12/2009  . Influenza, High Dose Seasonal PF 12/02/2012  . Influenza,inj,Quad PF,6+ Mos 10/26/2013  . Influenza-Unspecified 11/10/2014, 12/04/2015, 12/11/2016  . Pneumococcal Conjugate-13 04/11/2013  . Pneumococcal Polysaccharide-23 06/03/2006  . Td 03/07/2008  . Zoster 11/14/2009   Pertinent  Health Maintenance Due  Topic Date Due  . OPHTHALMOLOGY EXAM  03/03/2017 (Originally 11/30/2015)  . URINE MICROALBUMIN  03/03/2017 (Originally 04/09/2016)  . FOOT EXAM  01/14/2017  . HEMOGLOBIN A1C  01/30/2017  . INFLUENZA VACCINE  Completed  . PNA vac Low Risk Adult  Completed   Fall Risk  07/07/2016 01/15/2016 01/04/2016 10/19/2015 04/10/2015  Falls in the past year? No Yes Yes No No  Number falls in past yr: - 1 1 - -  Comment - - - - -  Injury with Fall? - No No - -  Comment - - - - -  Risk for fall due to : - Impaired balance/gait;Impaired mobility - - -  Follow up - Falls prevention discussed - - -    Vitals:   12/23/16 1128  BP: 103/67  Pulse: 60  Resp: 18  Temp: (!) 97.4 F (36.3 C)  SpO2: 98%  Weight: 185 lb (83.9 kg)  Height:  5' 8"  (1.727 m)   Body mass index is 28.13 kg/m. Physical Exam  Constitutional: He is oriented to person, place, and time. He appears well-developed and well-nourished. No distress.  HENT:  Head: Normocephalic.  Mouth/Throat: Oropharynx is clear and moist. No oropharyngeal exudate.  Eyes: Pupils are equal, round, and reactive to light. Conjunctivae and EOM are normal. Left eye exhibits no discharge. No scleral icterus.  Neck: Normal range of motion. No JVD  present. No thyromegaly present.  Cardiovascular: Normal rate, regular rhythm, normal heart sounds and intact distal pulses.  Exam reveals no gallop and no friction rub.   No murmur heard. Pulmonary/Chest: Effort normal and breath sounds normal. No respiratory distress. He has no wheezes. He has no rales.  Abdominal: Soft. Bowel sounds are normal. He exhibits no distension and no mass. There is no tenderness. There is no rebound and no guarding.  Genitourinary:  Genitourinary Comments: Incontinent for bladder. Grimaces and groans when voiding.   Musculoskeletal:  Moves x 4 extremities.unsteady gait uses FWW.   Lymphadenopathy:    He has no cervical adenopathy.  Neurological: He is oriented to person, place, and time.  Skin: Skin is warm and dry. No rash noted. No erythema. No pallor.  Psychiatric: He has a normal mood and affect.    Labs reviewed:  Recent Labs  07/22/16 0531  07/26/16 0537  07/27/16 0511  07/28/16 0520 08/04/16 10/06/16  NA 137  < > 139  < > 138  < > 140 138 141  K 3.6  < > 3.5  < > 3.8  --  3.7 4.1 3.7  CL 99*  < > 100*  --  99*  --  99*  --   --   CO2 30  < > 28  --  30  --  30  --   --   GLUCOSE 126*  < > 112*  --  114*  --  117*  --   --   BUN 15  < > 22*  < > 24*  < > 24* 17 13  CREATININE 0.93  < > 0.77  < > 1.01  < > 0.97 0.9 1.2  CALCIUM 9.2  < > 9.7  --  9.5  --  9.7  --   --   MG 1.7  --  1.7  --   --   --  1.7  --   --   < > = values in this interval not displayed.  Recent Labs   04/07/16 1311 07/07/16 1725 07/10/16 1546  AST 21 25 38  ALT 13 16 27   ALKPHOS 96 80 81  BILITOT 1.49* 0.9 1.8*  PROT 8.0 7.2 7.9  ALBUMIN 4.2 4.4 4.4    Recent Labs  07/10/16 1546  07/20/16 0505  07/25/16 0527  07/26/16 0537  07/27/16 0511 07/28/16 07/28/16 0520 08/04/16  WBC 15.9*  < > 12.2*  < > 11.8*  < > 9.9  < > 10.0 9.1 9.1 9.8  NEUTROABS 13.6*  --  9.7*  --  10.4*  --   --   --   --   --   --   --   HGB 14.3  < > 13.2  < > 12.7*  < > 13.0  < > 14.0  --  14.0 13.7  HCT 41.7  < > 40.6  < > 38.6*  < > 39.6  < > 44.1  --  42.5 42  MCV 92.3  < > 95.3  < > 94.8  --  94.3  --  95.2  --  95.3  --   PLT 252  < > 197  < > 216  < > 207  < > 225  --  227 241  < > = values in this interval not displayed. Lab Results  Component Value Date   TSH 1.48 04/10/2015   Lab Results  Component Value Date   HGBA1C 5.9 07/31/2016  Lab Results  Component Value Date   CHOL 91 10/06/2016   HDL 30 (A) 10/06/2016   LDLCALC 42 10/06/2016   TRIG 107 10/06/2016   CHOLHDL 2 04/10/2015    Significant Diagnostic Results in last 30 days:  Ct Angio Chest Aorta W/cm &/or Wo/cm  Result Date: 11/26/2016 CLINICAL DATA:  83 year old male status post graft repair of the ascending thoracic aorta. Evaluation for aortic root replacement. EXAM: CT ANGIOGRAPHY CHEST WITH CONTRAST TECHNIQUE: Multidetector CT imaging of the chest was performed using the standard protocol during bolus administration of intravenous contrast. Multiplanar CT image reconstructions and MIPs were obtained to evaluate the vascular anatomy. CONTRAST:  75 mL of Isovue 370. COMPARISON:  Chest CT 11/21/2015. FINDINGS: Creatinine was obtained on site at [place] at [address] Results: Creatinine 0.9 mg/dL. Cardiovascular: Heart size is enlarged with biatrial dilatation. Small filling defect in the tip of the left atrial appendage (axial image 59 of series 4), highly concerning for left atrial appendage thrombus. There is  no significant pericardial fluid, thickening or pericardial calcification. There is aortic atherosclerosis, as well as atherosclerosis of the great vessels of the mediastinum and the coronary arteries, including calcified atherosclerotic plaque in the left main, left anterior descending, left circumflex and right coronary arteries. Severe thickening and calcification of the aortic valve. Status post median sternotomy for graft repair of the ascending thoracic aorta. Aneurysmal dilatation of the proximal aortic arch which measures up to 5.5 cm in diameter, similar to prior study 11/21/2015 when measured in a similar fashion (outer diameter of the vessel as measured on image 34 of series 4 of the prior examination). Chronic dissection of the thoracic aorta involving the arch and descending thoracic aorta extending into the upper abdominal aorta again noted. Compared to the prior study aneurysmal dilatation of the distal aortic arch (4.9 cm in diameter) has slightly increased (previously 4.6 cm in diameter). The false lumen does not opacify with IV contrast in the thorax. Mediastinum/Nodes: No pathologically enlarged mediastinal or hilar lymph nodes. Esophagus is unremarkable in appearance. No axillary lymphadenopathy. Lungs/Pleura: Mild scarring in the inferior segment of the lingula. Several tiny perifissural nodules measuring 4 mm or less in size are noted, considered benign. No other larger more suspicious appearing pulmonary nodules or masses are noted. No acute consolidative airspace disease. No pleural effusions. Upper Abdomen: Liver has a shrunken appearance and nodular contour, indicative of cirrhosis. Multiple calcified gallstones lie dependently in the gallbladder. No findings to suggest an acute cholecystitis at this time. Aortic atherosclerosis with chronic dissection of the super renal, renal and infrarenal abdominal aorta. The dissection flap extends into the right renal artery. Left renal artery is fed  by the true lumen. Celiac axis, superior mesenteric artery and inferior mesenteric artery all appear to arise from the false lumen which is patent in the abdomen. 2.3 cm low-attenuation lesion in the upper pole the right kidney is compatible with a cyst. Musculoskeletal: Median sternotomy wires. There are no aggressive appearing lytic or blastic lesions noted in the visualized portions of the skeleton. New compression fracture of L2 with approximately 50% loss of anterior vertebral body height and post vertebroplasty changes. There are no aggressive appearing lytic or blastic lesions noted in the visualized portions of the skeleton. Review of the MIP images confirms the above findings. IMPRESSION: 1. Status post graft repair of the ascending thoracic aorta, with persistent aneurysmal dilatation and chronic dissection of the aortic arch. The aortic dissection extends into the abdomen, as  detailed above. The aortic arch is generally stable in size, with exception of the distal arch which is increased 4.6 cm in diameter on the prior study 11/21/2015 at 4.8 cm in diameter on today's examination. Maximal diameter of the aortic arch is proximally measuring up to 5.5 cm. 2. Severe thickening calcification of the aortic valve. 3. Aortic atherosclerosis, in addition to left main and 3 vessel coronary artery disease. 4. Cardiomegaly with biatrial dilatation. Importantly, there is a filling defect in the tip of the left atrial appendage which is highly concerning for left atrial appendage thrombus. Correlation with transesophageal echocardiography is suggested if clinically appropriate, as this may place the patient at risk for systemic embolization. 5. Morphologic changes in the liver indicative of cirrhosis. 6. Cholelithiasis without evidence to suggest an acute cholecystitis at this time. 7. Additional incidental findings, as above. These results will be called to the ordering clinician or representative by the Radiologist  Assistant, and communication documented in the PACS or zVision Dashboard. Aortic Atherosclerosis (ICD10-I70.0) and Aortic aneurysm NOS (ICD10-I71.9). Electronically Signed   By: Vinnie Langton M.D.   On: 11/26/2016 14:06   Assessment/Plan 1. Urinary tract infection symptoms Afebrile. Dysuria and hematuria reported. Has hx of bladder cancer. Will obtain urine analysis and culture and sensitivity to rule out UTI. continue to encourage fluid intake.   2. Insomnia Reports sleeping well. Will reduce trazodone 50 mg tablet to 1/2 Tablet ( 25 mg) by mouth once daily at bedtime x 1 week then changed to as needed. Continue to monitor.    Family/ staff Communication: Reviewed plan of care with patient and facility Nurse.  Labs/tests ordered: U/A and C/S rule out UTI  Sandrea Hughs, NP

## 2016-12-24 DIAGNOSIS — N390 Urinary tract infection, site not specified: Secondary | ICD-10-CM | POA: Diagnosis not present

## 2016-12-25 DIAGNOSIS — N390 Urinary tract infection, site not specified: Secondary | ICD-10-CM | POA: Diagnosis not present

## 2017-01-02 ENCOUNTER — Non-Acute Institutional Stay: Payer: Medicare Other

## 2017-01-02 DIAGNOSIS — Z0000 Encounter for general adult medical examination without abnormal findings: Secondary | ICD-10-CM | POA: Diagnosis not present

## 2017-01-02 NOTE — Progress Notes (Signed)
Subjective:   Eric Rivers is a 83 y.o. male who presents for Medicare Annual/Subsequent preventive examination at [facility]    Objective:    Vitals: BP 102/60 (BP Location: Right Arm, Patient Position: Sitting)   Pulse (!) 57   Temp (!) 97.5 F (36.4 C) (Oral)   Ht _0  (1.727 m)   Wt 185 lb (83.9 kg)   SpO2 98%   BMI 28.13 kg/m   Body mass index is 28.13 kg/m.  Tobacco History  Smoking Status  . Former Smoker  . Packs/day: 1.00  . Types: Cigarettes  . Quit date: 03/04/1975  Smokeless Tobacco  . Never Used    Comment: Quit in 1977     Counseling given: Not Answered   Past Medical History:  Diagnosis Date  . ACE-inhibitor cough   . Aortic dissection [facility])    Surgical repair February, 2014  . Aortic stenosis    mild....echo... september... 2010/ mild... echo.[name] KitchenMarland KitchenDec, 2011  . Atrial fibrillation (HCC)     Chronic,   24 hour holter, September, 2011.... atrial fib rate is controlled.... there is some bradycardia but  no marked pauses  . Bladder cancer [facility])    recurrence with TUR-B March '09  . DDD (degenerative disc disease)    lumbar spine  . Diabetes mellitus    type 2  . Diabetes mellitus with neuropathy ([place]) 12/04/2006   Qualifier: Diagnosis of  By: Linda Hedges MD, Heinz Knuckles  medications - DPP4   . Diverticulosis of colon with hemorrhage 01/01/2014  . Ejection fraction    EF 60%, echo, 01/2010  . Fluid overload   . Fracture of metacarpal bone 11/30/11  . GERD (gastroesophageal reflux disease)   . Gout   . History of echocardiogram    Echo 5/17 - mild LVH, EF 55-60%, mod AS (mean 16 mmHg, peak 29 mmHg), MAC, midl MR, massive BAE, mod dilated RV, low normal RVSF, mod TR, PASP 64 mmHg  . Hx of colonoscopy    approx. 10 years  . Hypercholesterolemia   . Hyperlipidemia   . Hypertension   . Mitral regurgitation   . Normal nuclear stress test    06/2005 , also ABI normal 2007  . Prostate cancer ([place])    radiation tx.  . Pulmonary  hypertension ([place])    4mHg, echo, 01/2010  . Shortness of breath    on exertion  . Warfarin anticoagulation    Atrial fib  . Whooping cough    Past Surgical History:  Procedure Laterality Date  . ASCENDING AORTIC ROOT REPLACEMENT N/A 04/15/2012   Procedure: ASCENDING AORTIC ROOT REPLACEMENT;  Surgeon: BGaye Pollack MD;  [place] [facility]  Service: Open Heart Surgery;  Laterality: N/A;  . bladder cancer biopsy  10/16/2010   negative for malignancy  . bladder microscopic  04/13/2007   high grade papillary urothelial lesions  . COLONOSCOPY N/A 01/01/2014   Procedure: COLONOSCOPY;  Surgeon: CGatha Mayer MD;  Location: [facility];  Service: Endoscopy;  Laterality: N/A;  . CYSTOSCOPY/RETROGRADE/URETEROSCOPY Bilateral 04/04/2013   Procedure: CYSTOSCOPY WITH RETROGRADE PYELOGRAM AND BLADDER BIOPSY;  Surgeon: DMolli Hazard MD;  Location: [facility];  Service: Urology;  Laterality: Bilateral;  . 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 EXPLORATION POST OPERATIVE OPEN HEART  04/2012  . Herniatic disc surgery    . IR KYPHO LUMBAR INC FX REDUCE BONE BX UNI/BIL CANNULATION INC/IMAGING  07/25/2016  . LUMBAR  LAMINECTOMY     '02  . PROSTATE BIOPSY  09/25/2011   GLEASON 3+3=6 AND 4+3=7  . TEE WITHOUT CARDIOVERSION N/A 04/15/2012   Procedure: TRANSESOPHAGEAL ECHOCARDIOGRAM (TEE);  Surgeon: Gaye Pollack, MD;  Location: [place];  Service: Open Heart Surgery;  Laterality: N/A;  . TRANSURETHRAL RESECTION OF BLADDER TUMOR N/A 06/13/2015   Procedure: TRANSURETHRAL RESECTION OF PROSTATE (TURP) CLOT EVACUATION AND FULGERATION, BLADDER STONE REMOVAL ;  Surgeon: Alexis Frock, MD;  Location: [facility];  Service: Urology;  Laterality: N/A;  . TUR-BT '06, '09     Family History  Problem Relation Age of Onset  . Prostate cancer Father 28       passed with prostate ca  . Cancer Father   . Stroke Father   . Heart disease Mother   . Hypertension Mother   . Heart attack  Mother    History  Sexual Activity  . Sexual activity: Not Currently    Outpatient Encounter Prescriptions as of 01/02/2017  Medication Sig  . acetaminophen (TYLENOL) 500 MG tablet Take 500 mg by mouth every 8 (eight) hours as needed for mild pain, moderate pain, fever or headache.   . allopurinol (ZYLOPRIM) 300 MG tablet take 1 tablet by mouth once daily  . atorvastatin (LIPITOR) 10 MG tablet Take 10 mg by mouth daily.  . Calcium Carbonate-Vitamin D (CALCIUM 600+D) 600-400 MG-UNIT tablet Take 1 tablet by mouth at bedtime.  [name] Kitchen desonide (DESOWEN) 0.05 % lotion Apply 1 application topically daily as needed (rosacea).   . docusate sodium (COLACE) 100 MG capsule Take 1 capsule (100 mg total) by mouth every 12 (twelve) hours.  . finasteride (PROSCAR) 5 MG tablet Take 1 tablet (5 mg total) by mouth every morning.  . furosemide (LASIX) 20 MG tablet Take 20 mg by mouth 2 (two) times daily. Hold for SBP <110  . gabapentin (NEURONTIN) 100 MG capsule Take 1 capsule (100 mg total) by mouth 3 (three) times daily.  [name] Kitchen ketoconazole (NIZORAL) 2 % shampoo Apply 1 application topically as needed.   [name] Kitchen lisinopril (PRINIVIL,ZESTRIL) 5 MG tablet Take 5 mg by mouth daily.  . mirabegron ER (MYRBETRIQ) 50 MG TB24 tablet Take 50 mg by mouth daily.  . Multiple Vitamin (THERAPEUTIC MULTIVITAMIN PO) Take 1 tablet by mouth daily.  [name] Kitchen omeprazole (PRILOSEC) 20 MG capsule Take 20 mg by mouth daily.  . polyethylene glycol (MIRALAX / GLYCOLAX) packet Take 17 g by mouth daily as needed.  . potassium chloride (K-DUR,KLOR-CON) 10 MEQ tablet take 1 tablet by mouth once daily  . senna (SENOKOT) 8.6 MG tablet Take 1 tablet by mouth at bedtime.   . sitaGLIPtin (JANUVIA) 25 MG tablet Take 25 mg by mouth daily.  . Tamsulosin HCl (FLOMAX) 0.4 MG CAPS Take 0.4 mg by mouth at bedtime.   [name] Kitchen tiZANidine (ZANAFLEX) 4 MG tablet Take 1 tablet (4 mg total) by mouth every 6 (six) hours as needed for muscle spasms. (Patient taking differently: Take  4 mg by mouth every 8 (eight) hours as needed for muscle spasms. )  . traMADol (ULTRAM) 50 MG tablet Take 1 tablet (50 mg total) by mouth every 6 (six) hours as needed for moderate pain.  . traZODone (DESYREL) 50 MG tablet Take 50 mg by mouth at bedtime.  [name] Kitchen warfarin (COUMADIN) 2.5 MG tablet Take 2.5 mg by mouth daily.   No facility-administered encounter medications on file as of 01/02/2017.     Activities of Daily Living In your present state of health, do  you have any difficulty performing the following activities: 01/02/2017 07/20/2016  Hearing? N -  Vision? N -  Difficulty concentrating or making decisions? Y -  Comment - -  Walking or climbing stairs? Y -  Comment - -  Dressing or bathing? Y -  Doing errands, shopping? Y [place] and eating ? Y -  Using the Toilet? Y -  In the past six months, have you accidently leaked urine? Y -  Do you have problems with loss of bowel control? N -  Managing your Medications? Y -  Managing your Finances? Y -  Housekeeping or managing your Housekeeping? Y -  Some recent data might be hidden    Patient Care Team: Blanchie Serve, MD as PCP - General (Internal Medicine) Carlena Bjornstad, MD (Cardiology) Earnie Larsson, MD (Neurosurgery) Rutherford Guys, MD (Ophthalmology) Renda Rolls, Jennefer Bravo, MD as Referring Physician (Dermatology) Alexis Frock, MD as Consulting Physician (Urology) Dorothy Spark, MD as Consulting Physician (Cardiology) Ngetich, Nelda Bucks, NP as Nurse Practitioner (Family Medicine)   Assessment:      Exercise Activities and Dietary recommendations Current Exercise Habits: The patient does not participate in regular exercise at present, Exercise limited by: orthopedic condition(s)  Goals      Patient Stated   . <enter goal here> (pt-stated)          Would like to maintain current lifestyle and activity level.       Fall Risk Fall Risk  01/02/2017 07/07/2016 01/15/2016 01/04/2016 10/19/2015    Falls in the past year? Yes No Yes Yes No  Number falls in past yr: 2 or more - 1 1 -  Comment - - - - -  Injury with Fall? Yes - No No -  Comment - - - - -  Risk for fall due to : - - Impaired balance/gait;Impaired mobility - -  Follow up - - Falls prevention discussed - -   Depression Screen PHQ 2/9 Scores 01/02/2017 07/07/2016 01/15/2016 01/04/2016  PHQ - 2 Score 0 0 0 0    Cognitive Function MMSE - Mini Mental State Exam 01/02/2017  Orientation to time 5  Orientation to Place 5  Registration 3  Attention/ Calculation 5  Recall 2  Language- name 2 objects 2  Language- repeat 1  Language- follow 3 step command 3  Language- read & follow direction 1  Write a sentence 1  Copy design 1  Total score 29        Immunization History  Administered Date(s) Administered  . H1N1 03/07/2008  . Influenza Split 12/04/2010, 11/10/2011  . Influenza Whole 12/06/2007, 11/29/2008, 11/12/2009  . Influenza, High Dose Seasonal PF 12/02/2012  . Influenza,inj,Quad PF,6+ Mos 10/26/2013  . Influenza-Unspecified 11/10/2014, 12/04/2015, 12/11/2016  . Pneumococcal Conjugate-13 04/11/2013  . Pneumococcal Polysaccharide-23 06/03/2006  . Td 03/07/2008  . Zoster 11/14/2009   Screening Tests Health Maintenance  Topic Date Due  . OPHTHALMOLOGY EXAM  03/03/2017 (Originally 11/30/2015)  . FOOT EXAM  01/14/2017  . HEMOGLOBIN A1C  01/30/2017  . TETANUS/TDAP  03/07/2018  . INFLUENZA VACCINE  Completed  . PNA vac Low Risk Adult  Completed      Plan:  I have personally reviewed and addressed the Medicare Annual Wellness questionnaire and have noted the following in the patient's chart:  A. Medical and social history B. Use of alcohol, tobacco or illicit drugs  C. Current medications and supplements D. Functional ability and status E.  Nutritional status  F.  Physical activity G. Advance directives H. List of other physicians I.  Hospitalizations, surgeries, and ER visits in previous 12 months J.   Sac to include hearing, vision, cognitive, depression L. Referrals and appointments - none  In addition, I have reviewed and discussed with patient certain preventive protocols, quality metrics, and best practice recommendations. A written personalized care plan for preventive services as well as general preventive health recommendations were provided to patient.  See attached scanned questionnaire for additional information.   Signed,   Rich Reining, RN Nurse Health Advisor   Quick Notes   Health Maintenance: Eye exam due-pt cancelled upcoming appointment and stated he doesn't see a need for it.     Abnormal Screen:MMSE 29/30. Did not pass clock drawing.     Patient Concerns: None     Nurse Concerns: None

## 2017-01-02 NOTE — Patient Instructions (Addendum)
Eric Rivers , Thank you for taking time to come for your Medicare Wellness Visit. I appreciate your ongoing commitment to your health goals. Please review the following plan we discussed and let me know if I can assist you in the future.   Screening recommendations/referrals: Colonoscopy excluded, you are over age 81 Recommended yearly ophthalmology/optometry visit for glaucoma screening and checkup Recommended yearly dental visit for hygiene and checkup  Vaccinations: Influenza vaccine up to date. Due 2019 fall season Pneumococcal vaccine up to date Tdap vaccine up to date. Due 03/07/2018 Shingles vaccine previously ordered.  Advanced directives: In Chart  Conditions/risks identified: None  Next appointment: Dr. Bubba Camp makes rounds  Preventive Care 19 Years and Older, Male Preventive care refers to lifestyle choices and visits with your health care provider that can promote health and wellness. What does preventive care include?  A yearly physical exam. This is also called an annual well check.  Dental exams once or twice a year.  Routine eye exams. Ask your health care provider how often you should have your eyes checked.  Personal lifestyle choices, including:  Daily care of your teeth and gums.  Regular physical activity.  Eating a healthy diet.  Avoiding tobacco and drug use.  Limiting alcohol use.  Practicing safe sex.  Taking low doses of aspirin every day.  Taking vitamin and mineral supplements as recommended by your health care provider. What happens during an annual well check? The services and screenings done by your health care provider during your annual well check will depend on your age, overall health, lifestyle risk factors, and family history of disease. Counseling  Your health care provider may ask you questions about your:  Alcohol use.  Tobacco use.  Drug use.  Emotional well-being.  Home and relationship well-being.  Sexual  activity.  Eating habits.  History of falls.  Memory and ability to understand (cognition).  Work and work Statistician. Screening  You may have the following tests or measurements:  Height, weight, and BMI.  Blood pressure.  Lipid and cholesterol levels. These may be checked every 5 years, or more frequently if you are over 75 years old.  Skin check.  Lung cancer screening. You may have this screening every year starting at age 71 if you have a 30-pack-year history of smoking and currently smoke or have quit within the past 15 years.  Fecal occult blood test (FOBT) of the stool. You may have this test every year starting at age 110.  Flexible sigmoidoscopy or colonoscopy. You may have a sigmoidoscopy every 5 years or a colonoscopy every 10 years starting at age 59.  Prostate cancer screening. Recommendations will vary depending on your family history and other risks.  Hepatitis C blood test.  Hepatitis B blood test.  Sexually transmitted disease (STD) testing.  Diabetes screening. This is done by checking your blood sugar (glucose) after you have not eaten for a while (fasting). You may have this done every 1-3 years.  Abdominal aortic aneurysm (AAA) screening. You may need this if you are a current or former smoker.  Osteoporosis. You may be screened starting at age 74 if you are at high risk. Talk with your health care provider about your test results, treatment options, and if necessary, the need for more tests. Vaccines  Your health care provider may recommend certain vaccines, such as:  Influenza vaccine. This is recommended every year.  Tetanus, diphtheria, and acellular pertussis (Tdap, Td) vaccine. You may need a Td booster every  10 years.  Zoster vaccine. You may need this after age 76.  Pneumococcal 13-valent conjugate (PCV13) vaccine. One dose is recommended after age 82.  Pneumococcal polysaccharide (PPSV23) vaccine. One dose is recommended after age  87. Talk to your health care provider about which screenings and vaccines you need and how often you need them. This information is not intended to replace advice given to you by your health care provider. Make sure you discuss any questions you have with your health care provider. Document Released: 03/16/2015 Document Revised: 11/07/2015 Document Reviewed: 12/19/2014 Elsevier Interactive Patient Education  2017 [place] Prevention in the Home Falls can cause injuries. They can happen to people of all ages. There are many things you can do to make your home safe and to help prevent falls. What can I do on the outside of my home?  Regularly fix the edges of walkways and driveways and fix any cracks.  Remove anything that might make you trip as you walk through a door, such as a raised step or threshold.  Trim any bushes or trees on the path to your home.  Use bright outdoor lighting.  Clear any walking paths of anything that might make someone trip, such as rocks or tools.  Regularly check to see if handrails are loose or broken. Make sure that both sides of any steps have handrails.  Any raised decks and porches should have guardrails on the edges.  Have any leaves, snow, or ice cleared regularly.  Use sand or salt on walking paths during winter.  Clean up any spills in your garage right away. This includes oil or grease spills. What can I do in the bathroom?  Use night lights.  Install grab bars by the toilet and in the tub and shower. Do not use towel bars as grab bars.  Use non-skid mats or decals in the tub or shower.  If you need to sit down in the shower, use a plastic, non-slip stool.  Keep the floor dry. Clean up any water that spills on the floor as soon as it happens.  Remove soap buildup in the tub or shower regularly.  Attach bath mats securely with double-sided non-slip rug tape.  Do not have throw rugs and other things on the floor that can make  you trip. What can I do in the bedroom?  Use night lights.  Make sure that you have a light by your bed that is easy to reach.  Do not use any sheets or blankets that are too big for your bed. They should not hang down onto the floor.  Have a firm chair that has side arms. You can use this for support while you get dressed.  Do not have throw rugs and other things on the floor that can make you trip. What can I do in the kitchen?  Clean up any spills right away.  Avoid walking on wet floors.  Keep items that you use a lot in easy-to-reach places.  If you need to reach something above you, use a strong step stool that has a grab bar.  Keep electrical cords out of the way.  Do not use floor polish or wax that makes floors slippery. If you must use wax, use non-skid floor wax.  Do not have throw rugs and other things on the floor that can make you trip. What can I do with my stairs?  Do not leave any items on the stairs.  Make sure  that there are handrails on both sides of the stairs and use them. Fix handrails that are broken or loose. Make sure that handrails are as long as the stairways.  Check any carpeting to make sure that it is firmly attached to the stairs. Fix any carpet that is loose or worn.  Avoid having throw rugs at the top or bottom of the stairs. If you do have throw rugs, attach them to the floor with carpet tape.  Make sure that you have a light switch at the top of the stairs and the bottom of the stairs. If you do not have them, ask someone to add them for you. What else can I do to help prevent falls?  Wear shoes that:  Do not have high heels.  Have rubber bottoms.  Are comfortable and fit you well.  Are closed at the toe. Do not wear sandals.  If you use a stepladder:  Make sure that it is fully opened. Do not climb a closed stepladder.  Make sure that both sides of the stepladder are locked into place.  Ask someone to hold it for you, if  possible.  Clearly mark and make sure that you can see:  Any grab bars or handrails.  First and last steps.  Where the edge of each step is.  Use tools that help you move around (mobility aids) if they are needed. These include:  Canes.  Walkers.  Scooters.  Crutches.  Turn on the lights when you go into a dark area. Replace any light bulbs as soon as they burn out.  Set up your furniture so you have a clear path. Avoid moving your furniture around.  If any of your floors are uneven, fix them.  If there are any pets around you, be aware of where they are.  Review your medicines with your doctor. Some medicines can make you feel dizzy. This can increase your chance of falling. Ask your doctor what other things that you can do to help prevent falls. This information is not intended to replace advice given to you by your health care provider. Make sure you discuss any questions you have with your health care provider. Document Released: 12/14/2008 Document Revised: 07/26/2015 Document Reviewed: 03/24/2014 Elsevier Interactive Patient Education  2017 [employer].

## 2017-01-14 ENCOUNTER — Encounter: Payer: Self-pay | Admitting: Internal Medicine

## 2017-01-14 ENCOUNTER — Non-Acute Institutional Stay (SKILLED_NURSING_FACILITY): Payer: Medicare Other | Admitting: Internal Medicine

## 2017-01-14 DIAGNOSIS — M1A9XX0 Chronic gout, unspecified, without tophus (tophi): Secondary | ICD-10-CM | POA: Diagnosis not present

## 2017-01-14 DIAGNOSIS — I482 Chronic atrial fibrillation, unspecified: Secondary | ICD-10-CM

## 2017-01-14 DIAGNOSIS — I7101 Dissection of ascending aorta: Secondary | ICD-10-CM

## 2017-01-14 DIAGNOSIS — I5032 Chronic diastolic (congestive) heart failure: Secondary | ICD-10-CM | POA: Diagnosis not present

## 2017-01-14 DIAGNOSIS — G4700 Insomnia, unspecified: Secondary | ICD-10-CM

## 2017-01-14 NOTE — Progress Notes (Signed)
Location:  [facility] Room Number: 24 Place of Service:  SNF [phone]) Provider:  Blanchie Serve MD  Blanchie Serve, MD  Patient Care Team: Blanchie Serve, MD as PCP - General (Internal Medicine) Carlena Bjornstad, MD (Cardiology) Earnie Larsson, MD (Neurosurgery) Rutherford Guys, MD (Ophthalmology) Renda Rolls, Jennefer Bravo, MD as Referring Physician (Dermatology) Alexis Frock, MD as Consulting Physician (Urology) Dorothy Spark, MD as Consulting Physician (Cardiology) Ngetich, Nelda Bucks, NP as Nurse Practitioner (Family Medicine)  Extended Emergency Contact Information Primary Emergency Contact: [place], [place] of [place] Phone: [phone] Mobile Phone: [phone] Relation: Friend Secondary Emergency Contact: Ervin Knack of [place] Phone: [phone] Relation: Friend  Code Status:  DNR Goals of care: Advanced Directive information Advanced Directives 01/14/2017  Does Patient Have a Medical Advance Directive? Yes  Type of Paramedic of [place];Out of facility DNR (pink MOST or yellow form);Living will  Does patient want to make changes to medical advance directive? No - Patient declined  Copy of [place] in Chart? Yes  Would patient like information on creating a medical advance directive? -  Pre-existing out of facility DNR order (yellow form or pink MOST form) Yellow form placed in chart (order not valid for inpatient use)     Chief Complaint  Patient presents with  . Medical Management of Chronic Issues    Routine Visit     HPI:  Pt is a 83 y.o. male seen today for medical management of chronic diseases. He denies any complaint this visit. He is tolerating warfarin well. No bleeding reported. He has few elevated BP readings og 174/81, 150/80, 159/88 and 160/90 on review. Denies any headache, chest [ain or dyspnea with it. Has rarely used his  prn trazodone at bedtime. Denies any reflux symptom this visit. On medication for BPH and urinary complaints associated with it. Has history of bladder cancer. Reviewed OV note from cardiothoracic surgery team 11/26/16 and cardiology 12/16/16.    Past Medical History:  Diagnosis Date  . ACE-inhibitor cough   . Aortic dissection [facility])    Surgical repair February, 2014  . Aortic stenosis    mild....echo... september... 2010/ mild... echo.[name] KitchenMarland KitchenDec, 2011  . Atrial fibrillation (HCC)     Chronic,   24 hour holter, September, 2011.... atrial fib rate is controlled.... there is some bradycardia but  no marked pauses  . Bladder cancer [facility])    recurrence with TUR-B March '09  . DDD (degenerative disc disease)    lumbar spine  . Diabetes mellitus    type 2  . Diabetes mellitus with neuropathy ([place]) 12/04/2006   Qualifier: Diagnosis of  By: Linda Hedges MD, Heinz Knuckles  medications - DPP4   . Diverticulosis of colon with hemorrhage 01/01/2014  . Ejection fraction    EF 60%, echo, 01/2010  . Fluid overload   . Fracture of metacarpal bone 11/30/11  . GERD (gastroesophageal reflux disease)   . Gout   . History of echocardiogram    Echo 5/17 - mild LVH, EF 55-60%, mod AS (mean 16 mmHg, peak 29 mmHg), MAC, midl MR, massive BAE, mod dilated RV, low normal RVSF, mod TR, PASP 64 mmHg  . Hx of colonoscopy    approx. 10 years  . Hypercholesterolemia   . Hyperlipidemia   . Hypertension   . Mitral regurgitation   . Normal nuclear stress test    06/2005 ,  also ABI normal 2007  . Prostate cancer ([place])    radiation tx.  . Pulmonary hypertension ([place])    71mHg, echo, 01/2010  . Shortness of breath    on exertion  . Warfarin anticoagulation    Atrial fib  . Whooping cough    Past Surgical History:  Procedure Laterality Date  . bladder cancer biopsy  10/16/2010   negative for malignancy  . bladder microscopic  04/13/2007   high grade papillary urothelial lesions  . 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 EXPLORATION POST OPERATIVE OPEN HEART  04/2012  . Herniatic disc surgery    . IR KYPHO LUMBAR INC FX REDUCE BONE BX UNI/BIL CANNULATION INC/IMAGING  07/25/2016  . LUMBAR LAMINECTOMY     '02  . PROSTATE BIOPSY  09/25/2011   GLEASON 3+3=6 AND 4+3=7  . TUR-BT '06, '09      Allergies  Allergen Reactions  . Metoprolol Other (See Comments)    Reaction:  Bradycardia with 241mBID dose    Outpatient Encounter Medications as of 01/14/2017  Medication Sig  . acetaminophen (TYLENOL) 500 MG tablet Take 500 mg by mouth every 8 (eight) hours as needed for mild pain, moderate pain, fever or headache.   . allopurinol (ZYLOPRIM) 300 MG tablet take 1 tablet by mouth once daily  . atorvastatin (LIPITOR) 10 MG tablet Take 10 mg by mouth daily.  . Calcium Carbonate-Vitamin D (CALCIUM 600+D) 600-400 MG-UNIT tablet Take 1 tablet by mouth at bedtime.  . [name] Kitchenesonide (DESOWEN) 0.05 % lotion Apply 1 application topically daily as needed (rosacea).   . docusate sodium (COLACE) 100 MG capsule Take 1 capsule (100 mg total) by mouth every 12 (twelve) hours.  . finasteride (PROSCAR) 5 MG tablet Take 1 tablet (5 mg total) by mouth every morning.  . furosemide (LASIX) 20 MG tablet Take 20 mg by mouth 2 (two) times daily. Hold for SBP <110  . gabapentin (NEURONTIN) 100 MG capsule Take 1 capsule (100 mg total) by mouth 3 (three) times daily.  . [name] Kitchenetoconazole (NIZORAL) 2 % shampoo Apply 1 application topically as needed.   . [name] Kitchenisinopril (PRINIVIL,ZESTRIL) 5 MG tablet Take 5 mg by mouth daily.  . mirabegron ER (MYRBETRIQ) 50 MG TB24 tablet Take 50 mg by mouth daily.  . Multiple Vitamin (THERAPEUTIC MULTIVITAMIN PO) Take 1 tablet by mouth daily.  . [name] Kitchenmeprazole (PRILOSEC) 20 MG capsule Take 20 mg by mouth daily.  . polyethylene glycol (MIRALAX / GLYCOLAX) packet Take 17 g by mouth daily as needed.  . potassium chloride (K-DUR,KLOR-CON) 10 MEQ tablet take 1 tablet by mouth once daily  . senna  (SENOKOT) 8.6 MG tablet Take 1 tablet by mouth at bedtime.   . Tamsulosin HCl (FLOMAX) 0.4 MG CAPS Take 0.4 mg by mouth at bedtime.   . [name] KitcheniZANidine (ZANAFLEX) 4 MG capsule Take 4 mg every 8 (eight) hours as needed by mouth for muscle spasms.  . traMADol (ULTRAM) 50 MG tablet Take 1 tablet (50 mg total) by mouth every 6 (six) hours as needed for moderate pain.  . traZODone (DESYREL) 50 MG tablet Take 25 mg at bedtime as needed by mouth.   . warfarin (COUMADIN) 5 MG tablet Take 5 mg daily by mouth. Wednesday and Thursday  . warfarin (COUMADIN) 6 MG tablet Take 6 mg daily by mouth. Sunday, Monday, Tuesday, Friday and Saturday  . [DISCONTINUED] tiZANidine (ZANAFLEX) 4 MG tablet Take 1 tablet (4 mg total) by mouth every  6 (six) hours as needed for muscle spasms. (Patient taking differently: Take 4 mg by mouth every 8 (eight) hours as needed for muscle spasms. )   No facility-administered encounter medications on file as of 01/14/2017.     Review of Systems  Constitutional: Negative for appetite change, chills and fever.  HENT: Positive for hearing loss. Negative for congestion, mouth sores and trouble swallowing.   Eyes: Negative for pain.  Respiratory: Negative for cough, shortness of breath and wheezing.   Cardiovascular: Negative for chest pain and palpitations.  Gastrointestinal: Negative for abdominal pain, nausea and vomiting.  Genitourinary: Positive for difficulty urinating and frequency. Negative for dysuria.  Musculoskeletal: Positive for back pain and gait problem.  Skin: Negative for rash.  Neurological: Negative for dizziness and headaches.  Hematological: Bruises/bleeds easily.  Psychiatric/Behavioral: Negative for confusion.    Immunization History  Administered Date(s) Administered  . H1N1 03/07/2008  . Influenza Split 12/04/2010, 11/10/2011  . Influenza Whole 12/06/2007, 11/29/2008, 11/12/2009  . Influenza, High Dose Seasonal PF 12/02/2012  . Influenza,inj,Quad PF,6+ Mos  10/26/2013  . Influenza-Unspecified 11/10/2014, 12/04/2015, 12/11/2016  . Pneumococcal Conjugate-13 04/11/2013  . Pneumococcal Polysaccharide-23 06/03/2006  . Td 03/07/2008  . Zoster 11/14/2009   Pertinent  Health Maintenance Due  Topic Date Due  . OPHTHALMOLOGY EXAM  03/03/2017 (Originally 11/30/2015)  . FOOT EXAM  01/14/2018 (Originally 01/14/2017)  . HEMOGLOBIN A1C  01/30/2017  . INFLUENZA VACCINE  Completed  . PNA vac Low Risk Adult  Completed   Fall Risk  01/02/2017 07/07/2016 01/15/2016 01/04/2016 10/19/2015  Falls in the past year? Yes No Yes Yes No  Number falls in past yr: 2 or more - 1 1 -  Comment - - - - -  Injury with Fall? Yes - No No -  Comment - - - - -  Risk for fall due to : - - Impaired balance/gait;Impaired mobility - -  Follow up - - Falls prevention discussed - -   Functional Status Survey:    Vitals:   01/14/17 1245  BP: (!) 148/62  Pulse: 63  Resp: 16  Temp: (!) 97.5 F (36.4 C)  TempSrc: Oral  SpO2: 94%  Weight: 188 lb 1.6 oz (85.3 kg)  Height: 5' 8"  (1.727 m)   Body mass index is 28.6 kg/m.   Wt Readings from Last 3 Encounters:  01/14/17 188 lb 1.6 oz (85.3 kg)  01/02/17 185 lb (83.9 kg)  12/23/16 185 lb (83.9 kg)   Physical Exam  Constitutional: He is oriented to person, place, and time. He appears well-developed. No distress.  overweight  HENT:  Head: Normocephalic and atraumatic.  Mouth/Throat: Oropharynx is clear and moist.  Eyes: EOM are normal. Pupils are equal, round, and reactive to light.  Neck: Normal range of motion. Neck supple.  Cardiovascular:  Irregular heart rate, systolic murmur present  Pulmonary/Chest: Effort normal and breath sounds normal. No respiratory distress. He has no wheezes.  Abdominal: Soft. Bowel sounds are normal. There is no tenderness. There is no guarding.  Musculoskeletal:  Able to move all 4 extremities, has a walker, needs minimum assistance with transfers  Lymphadenopathy:    He has no cervical  adenopathy.  Neurological: He is alert and oriented to person, place, and time.  Skin: Skin is warm and dry. He is not diaphoretic.  Psychiatric: He has a normal mood and affect.    Labs reviewed: Recent Labs    07/22/16 0531  07/26/16 0537  07/27/16 0511  07/28/16 0520 08/04/16 10/06/16  NA 137   < > 139   < > 138   < > 140 138 141  K 3.6   < > 3.5   < > 3.8  --  3.7 4.1 3.7  CL 99*   < > 100*  --  99*  --  99*  --   --   CO2 30   < > 28  --  30  --  30  --   --   GLUCOSE 126*   < > 112*  --  114*  --  117*  --   --   BUN 15   < > 22*   < > 24*   < > 24* 17 13  CREATININE 0.93   < > 0.77   < > 1.01   < > 0.97 0.9 1.2  CALCIUM 9.2   < > 9.7  --  9.5  --  9.7  --   --   MG 1.7  --  1.7  --   --   --  1.7  --   --    < > = values in this interval not displayed.   Recent Labs    04/07/16 1311 07/07/16 1725 07/10/16 1546  AST 21 25 38  ALT 13 16 27   ALKPHOS 96 80 81  BILITOT 1.49* 0.9 1.8*  PROT 8.0 7.2 7.9  ALBUMIN 4.2 4.4 4.4   Recent Labs    07/10/16 1546  07/20/16 0505  07/25/16 0527  07/26/16 0537  07/27/16 0511 07/28/16 07/28/16 0520 08/04/16  WBC 15.9*   < > 12.2*   < > 11.8*   < > 9.9   < > 10.0 9.1 9.1 9.8  NEUTROABS 13.6*  --  9.7*  --  10.4*  --   --   --   --   --   --   --   HGB 14.3   < > 13.2   < > 12.7*   < > 13.0   < > 14.0  --  14.0 13.7  HCT 41.7   < > 40.6   < > 38.6*   < > 39.6   < > 44.1  --  42.5 42  MCV 92.3   < > 95.3   < > 94.8  --  94.3  --  95.2  --  95.3  --   PLT 252   < > 197   < > 216   < > 207   < > 225  --  227 241   < > = values in this interval not displayed.   Lab Results  Component Value Date   TSH 1.48 04/10/2015   Lab Results  Component Value Date   HGBA1C 5.2 11/13/2016   Lab Results  Component Value Date   CHOL 91 10/06/2016   HDL 30 (A) 10/06/2016   LDLCALC 42 10/06/2016   TRIG 107 10/06/2016   CHOLHDL 2 04/10/2015    Significant Diagnostic Results in last 30 days:  No results  found.  Assessment/Plan  Chronic afib Controlled HR. Continue warfarin for stroke prophylaxis.   Insomnia Sleeping well at night and has not required prn trazodone. Discontinue this.   Chronic gout No recent flare up. Continue allopurinol 300 mg daily. Check uric acid level  Dissection of aorta Ascending aorta dissection. Followed by Dr Cyndia Bent. Currently symptom free. Plan is for conservative management. Reviewed OV note from cardiac and thoracic surgery team. Good BP control. Continue  lisinopril 5 mg daily for now.   Chronic diastolic CHF Euvolemic. Continue lisinopril 5 mg daily, furosemide 20 mg bid and kcl supplement. Check BMP.    Family/ staff Communication: reviewed care plan with patient and charge nurse.    Labs/tests ordered:  Cmp, uric acid   Blanchie Serve, MD Internal Medicine [facility] [address] [place], [place] [address] Cell Phone (Monday-Friday 8 am - 5 pm): [phone] On Call: [phone] and follow prompts after 5 pm and on weekends Office Phone: [phone] Office Fax: [phone]

## 2017-01-15 DIAGNOSIS — M109 Gout, unspecified: Secondary | ICD-10-CM | POA: Diagnosis not present

## 2017-01-15 DIAGNOSIS — I10 Essential (primary) hypertension: Secondary | ICD-10-CM | POA: Diagnosis not present

## 2017-01-16 DIAGNOSIS — Z5181 Encounter for therapeutic drug level monitoring: Secondary | ICD-10-CM | POA: Diagnosis not present

## 2017-01-16 LAB — BASIC METABOLIC PANEL
BUN: 16 (ref 4–21)
CREATININE: 0.8 (ref 0.6–1.3)
Glucose: 111
Potassium: 4 (ref 3.4–5.3)
SODIUM: 141 (ref 137–147)

## 2017-01-16 LAB — HEPATIC FUNCTION PANEL
ALK PHOS: 72 (ref 25–125)
ALT: 11 (ref 10–40)
AST: 18 (ref 14–40)
Bilirubin, Total: 0.6

## 2017-01-19 DIAGNOSIS — Z5181 Encounter for therapeutic drug level monitoring: Secondary | ICD-10-CM | POA: Diagnosis not present

## 2017-02-04 ENCOUNTER — Non-Acute Institutional Stay (SKILLED_NURSING_FACILITY): Payer: Medicare Other | Admitting: Family

## 2017-02-04 DIAGNOSIS — M79674 Pain in right toe(s): Secondary | ICD-10-CM | POA: Diagnosis not present

## 2017-02-04 NOTE — Progress Notes (Signed)
Location:  [facility] Room Number: 24 Place of Service:  SNF (31) Provider: Lalanya Rufener FNP-C  Blanchie Serve, MD  Patient Care Team: Blanchie Serve, MD as PCP - General (Internal Medicine) Carlena Bjornstad, MD (Cardiology) Earnie Larsson, MD (Neurosurgery) Rutherford Guys, MD (Ophthalmology) Renda Rolls, Jennefer Bravo, MD as Referring Physician (Dermatology) Alexis Frock, MD as Consulting Physician (Urology) Dorothy Spark, MD as Consulting Physician (Cardiology) Tiajah Oyster, Nelda Bucks, NP as Nurse Practitioner (Family Medicine)  Extended Emergency Contact Information Primary Emergency Contact: [place], [place] Phone: [phone] Mobile Phone: [phone] Relation: Friend Secondary Emergency Contact: Ervin Knack of [place] Phone: [phone] Relation: Friend  Code Status:  DNR Goals of care: Advanced Directive information Advanced Directives 01/14/2017  Does Patient Have a Medical Advance Directive? Yes  Type of Paramedic of [place];Out of facility DNR (pink MOST or yellow form);Living will  Does patient want to make changes to medical advance directive? No - Patient declined  Copy of [place] in Chart? Yes  Would patient like information on creating a medical advance directive? -  Pre-existing out of facility DNR order (yellow form or pink MOST form) Yellow form placed in chart (order not valid for inpatient use)     Chief Complaint  Patient presents with  . Acute Visit    right great toe pain     HPI:  Pt is a 83 y.o. male seen today at [facility] for an acute visit for evaluation of right great toe pain. He is seen in his room today per facility Nurse request. Nurse states patient complained of right great toe pain request for provider to evaluate.He has a significant medical history of Type 2 DM,Neuropathy among other  conditions.He states earlier prior to visit had pain on great toenail edges thought it was an ingrown toenail.He states has had a history of ingrown toenail. He denies any pain on toenail during visit. He denies any fever,chills,redness,swelling or drainage from right toenail site.He states has upcoming appointment with Podiatrist in one week.        Past Medical History:  Diagnosis Date  . ACE-inhibitor cough   . Aortic dissection [facility])    Surgical repair February, 2014  . Aortic stenosis    mild....echo... september... 2010/ mild... echo.[name] KitchenMarland KitchenDec, 2011  . Atrial fibrillation (HCC)     Chronic,   24 hour holter, September, 2011.... atrial fib rate is controlled.... there is some bradycardia but  no marked pauses  . Bladder cancer [facility])    recurrence with TUR-B March '09  . DDD (degenerative disc disease)    lumbar spine  . Diabetes mellitus    type 2  . Diabetes mellitus with neuropathy ([place]) 12/04/2006   Qualifier: Diagnosis of  By: Linda Hedges MD, Heinz Knuckles  medications - DPP4   . Diverticulosis of colon with hemorrhage 01/01/2014  . Ejection fraction    EF 60%, echo, 01/2010  . Fluid overload   . Fracture of metacarpal bone 11/30/11  . GERD (gastroesophageal reflux disease)   . Gout   . History of echocardiogram    Echo 5/17 - mild LVH, EF 55-60%, mod AS (mean 16 mmHg, peak 29 mmHg), MAC, midl MR, massive BAE, mod dilated RV, low normal RVSF, mod TR, PASP 64 mmHg  . Hx of colonoscopy    approx. 10 years  . Hypercholesterolemia   .  Hyperlipidemia   . Hypertension   . Mitral regurgitation   . Normal nuclear stress test    06/2005 , also ABI normal 2007  . Prostate cancer ([place])    radiation tx.  . Pulmonary hypertension ([place])    62mHg, echo, 01/2010  . Shortness of breath    on exertion  . Warfarin anticoagulation    Atrial fib  . Whooping cough    Past Surgical History:  Procedure Laterality Date  . ASCENDING AORTIC ROOT REPLACEMENT N/A 04/15/2012   Procedure: ASCENDING AORTIC  ROOT REPLACEMENT;  Surgeon: BGaye Pollack MD;  [facility]  Service: Open Heart Surgery;  Laterality: N/A;  . bladder cancer biopsy  10/16/2010   negative for malignancy  . bladder microscopic  04/13/2007   high grade papillary urothelial lesions  . COLONOSCOPY N/A 01/01/2014   Procedure: COLONOSCOPY;  Surgeon: CGatha Mayer MD;  Location: [facility];  Service: Endoscopy;  Laterality: N/A;  . CYSTOSCOPY/RETROGRADE/URETEROSCOPY Bilateral 04/04/2013   Procedure: CYSTOSCOPY WITH RETROGRADE PYELOGRAM AND BLADDER BIOPSY;  Surgeon: DMolli Hazard MD;  Location: [facility];  Service: Urology;  Laterality: Bilateral;  . 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 EXPLORATION POST OPERATIVE OPEN HEART  04/2012  . Herniatic disc surgery    . IR KYPHO LUMBAR INC FX REDUCE BONE BX UNI/BIL CANNULATION INC/IMAGING  07/25/2016  . LUMBAR LAMINECTOMY     '02  . PROSTATE BIOPSY  09/25/2011   GLEASON 3+3=6 AND 4+3=7  . TEE WITHOUT CARDIOVERSION N/A 04/15/2012   Procedure: TRANSESOPHAGEAL ECHOCARDIOGRAM (TEE);  Surgeon: BGaye Pollack MD;  [facility] [facility]  Service: Open Heart Surgery;  Laterality: N/A;  . TRANSURETHRAL RESECTION OF BLADDER TUMOR N/A 06/13/2015   Procedure: TRANSURETHRAL RESECTION OF PROSTATE (TURP) CLOT EVACUATION AND FULGERATION, BLADDER STONE REMOVAL ;  Surgeon: TAlexis Frock MD;  Location: [facility];  Service: Urology;  Laterality: N/A;  . TUR-BT '06, '09      Allergies  Allergen Reactions  . Metoprolol Other (See Comments)    Reaction:  Bradycardia with 27mBID dose    Outpatient Encounter Medications as of 02/04/2017  Medication Sig  . acetaminophen (TYLENOL) 500 MG tablet Take 500 mg by mouth every 8 (eight) hours as needed for mild pain, moderate pain, fever or headache.   . allopurinol (ZYLOPRIM) 300 MG tablet take 1 tablet by mouth once daily  . atorvastatin (LIPITOR) 10 MG tablet Take 10 mg by mouth daily.  . Calcium Carbonate-Vitamin D  (CALCIUM 600+D) 600-400 MG-UNIT tablet Take 1 tablet by mouth at bedtime.  . [name] Kitchenesonide (DESOWEN) 0.05 % lotion Apply 1 application topically daily as needed (rosacea).   . docusate sodium (COLACE) 100 MG capsule Take 1 capsule (100 mg total) by mouth every 12 (twelve) hours.  . finasteride (PROSCAR) 5 MG tablet Take 1 tablet (5 mg total) by mouth every morning.  . furosemide (LASIX) 20 MG tablet Take 20 mg by mouth 2 (two) times daily. Hold for SBP <110  . gabapentin (NEURONTIN) 100 MG capsule Take 1 capsule (100 mg total) by mouth 3 (three) times daily.  . [name] Kitchenetoconazole (NIZORAL) 2 % shampoo Apply 1 application topically as needed.   . [name] Kitchenisinopril (PRINIVIL,ZESTRIL) 5 MG tablet Take 5 mg by mouth daily.  . mirabegron ER (MYRBETRIQ) 50 MG TB24 tablet Take 50 mg by mouth daily.  . Multiple Vitamin (THERAPEUTIC MULTIVITAMIN PO) Take 1 tablet by mouth daily.  . [name] Kitchenmeprazole (PRILOSEC) 20 MG capsule  Take 20 mg by mouth daily.  . polyethylene glycol (MIRALAX / GLYCOLAX) packet Take 17 g by mouth daily as needed.  . potassium chloride (K-DUR,KLOR-CON) 10 MEQ tablet take 1 tablet by mouth once daily  . senna (SENOKOT) 8.6 MG tablet Take 1 tablet by mouth at bedtime.   . Tamsulosin HCl (FLOMAX) 0.4 MG CAPS Take 0.4 mg by mouth at bedtime.   [name] Kitchen tiZANidine (ZANAFLEX) 4 MG capsule Take 4 mg every 8 (eight) hours as needed by mouth for muscle spasms.  . traMADol (ULTRAM) 50 MG tablet Take 1 tablet (50 mg total) by mouth every 6 (six) hours as needed for moderate pain.  [name] Kitchen warfarin (COUMADIN) 5 MG tablet Take 5 mg daily by mouth. Wednesday and Thursday  . warfarin (COUMADIN) 6 MG tablet Take 6 mg daily by mouth. Sunday, Monday, Tuesday, Friday and Saturday  . [DISCONTINUED] traZODone (DESYREL) 50 MG tablet Take 25 mg at bedtime as needed by mouth.    No facility-administered encounter medications on file as of 02/04/2017.     Review of Systems  Constitutional: Negative for chills, fatigue and fever.    Respiratory: Negative for cough, chest tightness, shortness of breath and wheezing.   Cardiovascular: Negative for chest pain, palpitations and leg swelling.  Gastrointestinal: Negative for abdominal distention, abdominal pain, constipation, diarrhea, nausea and vomiting.  Musculoskeletal: Positive for arthralgias and gait problem.       Right great toenail pain per HPI  Skin: Negative for color change, pallor and wound.  Psychiatric/Behavioral: Negative for agitation. The patient is not nervous/anxious.     Immunization History  Administered Date(s) Administered  . H1N1 03/07/2008  . Influenza Split 12/04/2010, 11/10/2011  . Influenza Whole 12/06/2007, 11/29/2008, 11/12/2009  . Influenza, High Dose Seasonal PF 12/02/2012  . Influenza,inj,Quad PF,6+ Mos 10/26/2013  . Influenza-Unspecified 11/10/2014, 12/04/2015, 12/11/2016  . Pneumococcal Conjugate-13 04/11/2013  . Pneumococcal Polysaccharide-23 06/03/2006  . Td 03/07/2008  . Zoster 11/14/2009   Pertinent  Health Maintenance Due  Topic Date Due  . OPHTHALMOLOGY EXAM  03/03/2017 (Originally 11/30/2015)  . FOOT EXAM  01/14/2018 (Originally 01/14/2017)  . HEMOGLOBIN A1C  05/13/2017  . INFLUENZA VACCINE  Completed  . PNA vac Low Risk Adult  Completed   Fall Risk  01/02/2017 07/07/2016 01/15/2016 01/04/2016 10/19/2015  Falls in the past year? Yes No Yes Yes No  Number falls in past yr: 2 or more - 1 1 -  Comment - - - - -  Injury with Fall? Yes - No No -  Comment - - - - -  Risk for fall due to : - - Impaired balance/gait;Impaired mobility - -  Follow up - - Falls prevention discussed - -    Vitals:   02/04/17 1255  BP: (!) 158/80  Pulse: (!) 53  Temp: (!) 97 F (36.1 C)  SpO2: 94%  Weight: 188 lb 9.6 oz (85.5 kg)  Height: _0  (1.727 m)   Body mass index is 28.68 kg/m. Physical Exam  Constitutional: He is oriented to person, place, and time. He appears well-developed and well-nourished. No distress.  HENT:  Head:  Normocephalic.  Mouth/Throat: Oropharynx is clear and moist. No oropharyngeal exudate.  Eyes: Conjunctivae and EOM are normal. Pupils are equal, round, and reactive to light. Right eye exhibits no discharge. Left eye exhibits no discharge. No scleral icterus.  Neck: Normal range of motion. No JVD present. No thyromegaly present.  Cardiovascular: Normal rate, regular rhythm, normal heart sounds and intact distal pulses. Exam  reveals no gallop and no friction rub.  No murmur heard. Pulmonary/Chest: Effort normal and breath sounds normal. No respiratory distress. He has no wheezes. He has no rales.  Abdominal: Soft. Bowel sounds are normal. He exhibits no distension. There is no tenderness. There is no rebound and no guarding.  Musculoskeletal: He exhibits no edema or tenderness.  Uses walker but has been transitioning to cane.   Lymphadenopathy:    He has no cervical adenopathy.  Neurological: He is oriented to person, place, and time. Coordination normal.  Skin: Skin is warm and dry. No rash noted. No erythema.  Right great toe nail without any redness,swelling or drainage.  Psychiatric: He has a normal mood and affect.   Labs reviewed: Recent Labs    07/22/16 0531  07/26/16 0537  07/27/16 0511  07/28/16 0520 08/04/16 10/06/16  NA 137   < > 139   < > 138   < > 140 138 141  K 3.6   < > 3.5   < > 3.8  --  3.7 4.1 3.7  CL 99*   < > 100*  --  99*  --  99*  --   --   CO2 30   < > 28  --  30  --  30  --   --   GLUCOSE 126*   < > 112*  --  114*  --  117*  --   --   BUN 15   < > 22*   < > 24*   < > 24* 17 13  CREATININE 0.93   < > 0.77   < > 1.01   < > 0.97 0.9 1.2  CALCIUM 9.2   < > 9.7  --  9.5  --  9.7  --   --   MG 1.7  --  1.7  --   --   --  1.7  --   --    < > = values in this interval not displayed.   Recent Labs    04/07/16 1311 07/07/16 1725 07/10/16 1546  AST 21 25 38  ALT _0 ALKPHOS 96 80 81  BILITOT 1.49* 0.9 1.8*  PROT 8.0 7.2 7.9  ALBUMIN 4.2 4.4 4.4    Recent Labs    07/10/16 1546  07/20/16 0505  07/25/16 0527  07/26/16 0537  07/27/16 0511 07/28/16 07/28/16 0520 08/04/16  WBC 15.9*   < > 12.2*   < > 11.8*   < > 9.9   < > 10.0 9.1 9.1 9.8  NEUTROABS 13.6*  --  9.7*  --  10.4*  --   --   --   --   --   --   --   HGB 14.3   < > 13.2   < > 12.7*   < > 13.0   < > 14.0  --  14.0 13.7  HCT 41.7   < > 40.6   < > 38.6*   < > 39.6   < > 44.1  --  42.5 42  MCV 92.3   < > 95.3   < > 94.8  --  94.3  --  95.2  --  95.3  --   PLT 252   < > 197   < > 216   < > 207   < > 225  --  227 241   < > = values in this interval not displayed.   Lab Results  Component Value Date   TSH 1.48 04/10/2015   Lab Results  Component Value Date   HGBA1C 5.2 11/13/2016   Lab Results  Component Value Date   CHOL 91 10/06/2016   HDL 30 (A) 10/06/2016   LDLCALC 42 10/06/2016   TRIG 107 10/06/2016   CHOLHDL 2 04/10/2015    Significant Diagnostic Results in last 30 days:  No results found.  Assessment/Plan  Great toe pain, right Afebrile.Right great toe nail and surrounding skin tissue without any redness,swelling or drainage.continue to monitor for now.Encourage to report any signs of infection.follow up with podiatrist as directed.   Family/ staff Communication: Reviewed plan of care with patient and facility Nurse supervisor  Labs/tests ordered: None   Sandrea Hughs, NP

## 2017-02-17 ENCOUNTER — Encounter: Payer: Self-pay | Admitting: Family

## 2017-02-17 ENCOUNTER — Non-Acute Institutional Stay (SKILLED_NURSING_FACILITY): Payer: Medicare Other | Admitting: Family

## 2017-02-17 DIAGNOSIS — I482 Chronic atrial fibrillation, unspecified: Secondary | ICD-10-CM

## 2017-02-17 DIAGNOSIS — I110 Hypertensive heart disease with heart failure: Secondary | ICD-10-CM | POA: Diagnosis not present

## 2017-02-17 DIAGNOSIS — K219 Gastro-esophageal reflux disease without esophagitis: Secondary | ICD-10-CM | POA: Diagnosis not present

## 2017-02-17 DIAGNOSIS — I5032 Chronic diastolic (congestive) heart failure: Secondary | ICD-10-CM | POA: Diagnosis not present

## 2017-02-17 DIAGNOSIS — N400 Enlarged prostate without lower urinary tract symptoms: Secondary | ICD-10-CM

## 2017-02-17 NOTE — Progress Notes (Signed)
Location:  [place] Room Number: 24 Place of Service:  SNF (31) Provider: Cailen Mihalik FNP-C   Blanchie Serve, MD  Patient Care Team: Blanchie Serve, MD as PCP - General (Internal Medicine) Carlena Bjornstad, MD (Cardiology) Earnie Larsson, MD (Neurosurgery) Rutherford Guys, MD (Ophthalmology) Renda Rolls, Jennefer Bravo, MD as Referring Physician (Dermatology) Alexis Frock, MD as Consulting Physician (Urology) Dorothy Spark, MD as Consulting Physician (Cardiology) Raydan Schlabach, Nelda Bucks, NP as Nurse Practitioner (Family Medicine)  Extended Emergency Contact Information Primary Emergency Contact: [place], [place] Phone: [phone] Mobile Phone: [phone] Relation: Friend Secondary Emergency Contact: Ervin Knack of [place] Phone: [phone] Relation: Friend  Code Status: DNR Goals of care: Advanced Directive information Advanced Directives 02/17/2017  Does Patient Have a Medical Advance Directive? Yes  Type of Paramedic of [place];Living will;Out of facility DNR (pink MOST or yellow form)  Does patient want to make changes to medical advance directive? -  Copy of [place] in Chart? Yes  Would patient like information on creating a medical advance directive? -  Pre-existing out of facility DNR order (yellow form or pink MOST form) Yellow form placed in chart (order not valid for inpatient use)     Chief Complaint  Patient presents with  . Medical Management of Chronic Issues    routine visit; also would like to stop lasix    HPI:  Pt is a 83 y.o. [place] seen today [facility] for medical management of chronic diseases.He has a significant medical history of HTN,Afib, CHF,BPH,GERD, Type 2 DM,DJD among other conditions.He is seen in his room today. He denies any acute issues this visit.He states has been walking on the facility  hallway using a cane at least twice daily.No recent fall episode or weight changes reported.He requests his Furosemide to be discontinued due to increased urine incontinence.He states a friend in the medical field gave him advise not to take lasix unless SBP > 130. He denies any cough,shortness of breath,wheezing or edema. He elevates his legs while seated on recliner.    Past Medical History:  Diagnosis Date  . ACE-inhibitor cough   . Aortic dissection [facility])    Surgical repair February, 2014  . Aortic stenosis    mild....echo... september... 2010/ mild... echo.[name] KitchenMarland KitchenDec, 2011  . Atrial fibrillation (HCC)     Chronic,   24 hour holter, September, 2011.... atrial fib rate is controlled.... there is some bradycardia but  no marked pauses  . Bladder cancer [facility])    recurrence with TUR-B March '09  . DDD (degenerative disc disease)    lumbar spine  . Diabetes mellitus    type 2  . Diabetes mellitus with neuropathy ([place]) 12/04/2006   Qualifier: Diagnosis of  By: Linda Hedges MD, Heinz Knuckles  medications - DPP4   . Diverticulosis of colon with hemorrhage 01/01/2014  . Ejection fraction    EF 60%, echo, 01/2010  . Fluid overload   . Fracture of metacarpal bone 11/30/11  . GERD (gastroesophageal reflux disease)   . Gout   . History of echocardiogram    Echo 5/17 - mild LVH, EF 55-60%, mod AS (mean 16 mmHg, peak 29 mmHg), MAC, midl MR, massive BAE, mod dilated RV, low normal RVSF, mod TR, PASP 64 mmHg  . Hx of colonoscopy    approx. 10 years  . Hypercholesterolemia   . Hyperlipidemia   .  Hypertension   . Mitral regurgitation   . Normal nuclear stress test    06/2005 , also ABI normal 2007  . Prostate cancer ([place])    radiation tx.  . Pulmonary hypertension ([place])    55mHg, echo, 01/2010  . Shortness of breath    on exertion  . Warfarin anticoagulation    Atrial fib  . Whooping cough    Past Surgical History:  Procedure Laterality Date  . ASCENDING AORTIC ROOT REPLACEMENT N/A 04/15/2012    Procedure: ASCENDING AORTIC ROOT REPLACEMENT;  Surgeon: BGaye Pollack MD;  Location: [facility]  Service: Open Heart Surgery;  Laterality: N/A;  . bladder cancer biopsy  10/16/2010   negative for malignancy  . bladder microscopic  04/13/2007   high grade papillary urothelial lesions  . COLONOSCOPY N/A 01/01/2014   Procedure: COLONOSCOPY;  Surgeon: CGatha Mayer MD;  Location: [facility];  Service: Endoscopy;  Laterality: N/A;  . CYSTOSCOPY/RETROGRADE/URETEROSCOPY Bilateral 04/04/2013   Procedure: CYSTOSCOPY WITH RETROGRADE PYELOGRAM AND BLADDER BIOPSY;  Surgeon: DMolli Hazard MD;  Location: [facility];  Service: Urology;  Laterality: Bilateral;  . 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 EXPLORATION POST OPERATIVE OPEN HEART  04/2012  . Herniatic disc surgery    . IR KYPHO LUMBAR INC FX REDUCE BONE BX UNI/BIL CANNULATION INC/IMAGING  07/25/2016  . LUMBAR LAMINECTOMY     '02  . PROSTATE BIOPSY  09/25/2011   GLEASON 3+3=6 AND 4+3=7  . TEE WITHOUT CARDIOVERSION N/A 04/15/2012   Procedure: TRANSESOPHAGEAL ECHOCARDIOGRAM (TEE);  Surgeon: BGaye Pollack MD;  [facility]  Service: Open Heart Surgery;  Laterality: N/A;  . TRANSURETHRAL RESECTION OF BLADDER TUMOR N/A 06/13/2015   Procedure: TRANSURETHRAL RESECTION OF PROSTATE (TURP) CLOT EVACUATION AND FULGERATION, BLADDER STONE REMOVAL ;  Surgeon: TAlexis Frock MD;  Location[facility];  Service: Urology;  Laterality: N/A;  . TUR-BT '06, '09      Allergies  Allergen Reactions  . Metoprolol Other (See Comments)    Reaction:  Bradycardia with 233mBID dose    Allergies as of 02/17/2017      Reactions   Metoprolol Other (See Comments)   Reaction:  Bradycardia with 2560mID dose      Medication List        Accurate as of 02/17/17 12:23 PM. Always use your most recent med list.          acetaminophen 500 MG tablet Commonly known as:  TYLENOL Take 500 mg by mouth every 8 (eight) hours as needed  for mild pain, moderate pain, fever or headache.   allopurinol 300 MG tablet Commonly known as:  ZYLOPRIM take 1 tablet by mouth once daily   atorvastatin 10 MG tablet Commonly known as:  LIPITOR Take 10 mg by mouth daily.   CALCIUM 600+D 600-400 MG-UNIT tablet Generic drug:  Calcium Carbonate-Vitamin D Take 1 tablet by mouth at bedtime.   desonide 0.05 % lotion Commonly known as:  DESOWEN Apply 1 application topically daily as needed (rosacea).   docusate sodium 100 MG capsule Commonly known as:  COLACE Take 1 capsule (100 mg total) by mouth every 12 (twelve) hours.   finasteride 5 MG tablet Commonly known as:  PROSCAR Take 1 tablet (5 mg total) by mouth every morning.   furosemide 20 MG tablet Commonly known as:  LASIX Take 20 mg by mouth 2 (two) times daily. Hold for SBP <110   gabapentin 100 MG capsule  Commonly known as:  NEURONTIN Take 1 capsule (100 mg total) by mouth 3 (three) times daily.   ketoconazole 2 % shampoo Commonly known as:  NIZORAL Apply 1 application topically as needed.   lisinopril 5 MG tablet Commonly known as:  PRINIVIL,ZESTRIL Take 5 mg by mouth daily.   MYRBETRIQ 50 MG Tb24 tablet Generic drug:  mirabegron ER Take 50 mg by mouth daily.   omeprazole 20 MG capsule Commonly known as:  PRILOSEC Take 20 mg by mouth daily.   polyethylene glycol packet Commonly known as:  MIRALAX / GLYCOLAX Take 17 g by mouth daily as needed.   potassium chloride 10 MEQ tablet Commonly known as:  K-DUR,KLOR-CON take 1 tablet by mouth once daily   senna 8.6 MG tablet Commonly known as:  SENOKOT Take 1 tablet by mouth at bedtime.   tamsulosin 0.4 MG Caps capsule Commonly known as:  FLOMAX Take 0.4 mg by mouth at bedtime.   THERAPEUTIC MULTIVITAMIN PO Take 1 tablet by mouth daily.   tiZANidine 4 MG capsule Commonly known as:  ZANAFLEX Take 4 mg every 8 (eight) hours as needed by mouth for muscle spasms.   traMADol 50 MG tablet Commonly known  as:  ULTRAM Take 1 tablet (50 mg total) by mouth every 6 (six) hours as needed for moderate pain.   warfarin 6 MG tablet Commonly known as:  COUMADIN Take 6 mg daily by mouth. Sunday, Monday, Tuesday, Friday and Saturday   warfarin 5 MG tablet Commonly known as:  COUMADIN Take 5 mg daily by mouth. Wednesday and Thursday       Review of Systems  Constitutional: Negative for activity change, appetite change, chills, fatigue and fever.  HENT: Negative for congestion, hearing loss, rhinorrhea, sinus pressure, sinus pain, sneezing and sore throat.   Eyes: Negative for discharge, redness, itching and visual disturbance.  Respiratory: Negative for cough, chest tightness, shortness of breath and wheezing.   Cardiovascular: Negative for chest pain, palpitations and leg swelling.  Gastrointestinal: Negative for abdominal distention, abdominal pain, constipation, diarrhea, nausea and vomiting.  Endocrine: Negative for cold intolerance, heat intolerance, polydipsia, polyphagia and polyuria.  Genitourinary: Negative for dysuria, frequency and urgency.  Musculoskeletal: Positive for arthralgias and gait problem.  Skin: Negative for color change, pallor, rash and wound.  Neurological: Negative for dizziness, syncope, light-headedness and headaches.  Hematological: Does not bruise/bleed easily.  Psychiatric/Behavioral: Negative for agitation, confusion and sleep disturbance. The patient is not nervous/anxious.     Immunization History  Administered Date(s) Administered  . H1N1 03/07/2008  . Influenza Split 12/04/2010, 11/10/2011  . Influenza Whole 12/06/2007, 11/29/2008, 11/12/2009  . Influenza, High Dose Seasonal PF 12/02/2012  . Influenza,inj,Quad PF,6+ Mos 10/26/2013  . Influenza-Unspecified 11/10/2014, 12/04/2015, 12/11/2016  . Pneumococcal Conjugate-13 04/11/2013  . Pneumococcal Polysaccharide-23 06/03/2006  . Td 03/07/2008  . Zoster 11/14/2009   Pertinent  Health Maintenance Due    Topic Date Due  . OPHTHALMOLOGY EXAM  03/03/2017 (Originally 11/30/2015)  . FOOT EXAM  01/14/2018 (Originally 01/14/2017)  . HEMOGLOBIN A1C  05/13/2017  . INFLUENZA VACCINE  Completed  . PNA vac Low Risk Adult  Completed   Fall Risk  01/02/2017 07/07/2016 01/15/2016 01/04/2016 10/19/2015  Falls in the past year? Yes No Yes Yes No  Number falls in past yr: 2 or more - 1 1 -  Comment - - - - -  Injury with Fall? Yes - No No -  Comment - - - - -  Risk for fall due to : - -  Impaired balance/gait;Impaired mobility - -  Follow up - - Falls prevention discussed - -    Vitals:   02/17/17 1141  BP: 124/80  Pulse: 68  Resp: 20  Temp: (!) 96.8 F (36 C)  SpO2: 98%  Weight: 188 lb 9.6 oz (85.5 kg)  Height: _0  (1.727 m)   Body mass index is 28.68 kg/m. Physical Exam  Constitutional: He is oriented to person, place, and time. He appears well-developed and well-nourished. No distress.  HENT:  Head: Normocephalic.  Mouth/Throat: Oropharynx is clear and moist. No oropharyngeal exudate.  Eyes: Conjunctivae and EOM are normal. Pupils are equal, round, and reactive to light. Right eye exhibits no discharge. Left eye exhibits no discharge. No scleral icterus.  Neck: Normal range of motion. No JVD present. No thyromegaly present.  Cardiovascular: Intact distal pulses. Exam reveals no gallop and no friction rub.  Murmur heard. HR irregular   Pulmonary/Chest: Effort normal and breath sounds normal. No respiratory distress. He has no wheezes. He has no rales.  Abdominal: Soft. Bowel sounds are normal. He exhibits no distension. There is no tenderness. There is no rebound and no guarding.  Musculoskeletal: He exhibits no edema or tenderness.  Unsteady gait uses a cane. FWW for long distances.   Lymphadenopathy:    He has no cervical adenopathy.  Neurological: He is oriented to person, place, and time. Coordination normal.  Skin: Skin is warm and dry. No rash noted. No erythema.  Psychiatric: He  has a normal mood and affect.   Labs reviewed: Recent Labs    07/22/16 0531  07/26/16 0537  07/27/16 0511  07/28/16 0520 08/04/16 10/06/16  NA 137   < > 139   < > 138   < > 140 138 141  K 3.6   < > 3.5   < > 3.8  --  3.7 4.1 3.7  CL 99*   < > 100*  --  99*  --  99*  --   --   CO2 30   < > 28  --  30  --  30  --   --   GLUCOSE 126*   < > 112*  --  114*  --  117*  --   --   BUN 15   < > 22*   < > 24*   < > 24* 17 13  CREATININE 0.93   < > 0.77   < > 1.01   < > 0.97 0.9 1.2  CALCIUM 9.2   < > 9.7  --  9.5  --  9.7  --   --   MG 1.7  --  1.7  --   --   --  1.7  --   --    < > = values in this interval not displayed.   Recent Labs    04/07/16 1311 07/07/16 1725 07/10/16 1546  AST 21 25 38  ALT _1 ALKPHOS 96 80 81  BILITOT 1.49* 0.9 1.8*  PROT 8.0 7.2 7.9  ALBUMIN 4.2 4.4 4.4   Recent Labs    07/10/16 1546  07/20/16 0505  07/25/16 0527  07/26/16 0537  07/27/16 0511 07/28/16 07/28/16 0520 08/04/16  WBC 15.9*   < > 12.2*   < > 11.8*   < > 9.9   < > 10.0 9.1 9.1 9.8  NEUTROABS 13.6*  --  9.7*  --  10.4*  --   --   --   --   --   --   --  HGB 14.3   < > 13.2   < > 12.7*   < > 13.0   < > 14.0  --  14.0 13.7  HCT 41.7   < > 40.6   < > 38.6*   < > 39.6   < > 44.1  --  42.5 42  MCV 92.3   < > 95.3   < > 94.8  --  94.3  --  95.2  --  95.3  --   PLT 252   < > 197   < > 216   < > 207   < > 225  --  227 241   < > = values in this interval not displayed.   Lab Results  Component Value Date   TSH 1.48 04/10/2015   Lab Results  Component Value Date   HGBA1C 5.2 11/13/2016   Lab Results  Component Value Date   CHOL 91 10/06/2016   HDL 30 (A) 10/06/2016   LDLCALC 42 10/06/2016   TRIG 107 10/06/2016   CHOLHDL 2 04/10/2015    Significant Diagnostic Results in last 30 days:  No results found.  Assessment/Plan 1. Chronic diastolic CHF (congestive heart failure) Appears compensated. Weight reviewed: 01/07/2017 188.1 lbs 02/02/2017 188.6 lbs Exam findings negative  for edema,cough,shortness of breath,wheezing or rales.continue on Lisinopril 5 mg tablet daily. Reduce furosemide to 10 mg tablet twice daily.Will reduce to 10 mg tablet daily if symptoms remains stable.Hold Furosemide for SBP< 110.Monitor weight weekly.continue on Potassium 10 meq tablet daily. Recheck BMP 02/26/2017.  2. Hypertensive heart disease with chronic diastolic congestive heart failure B/p stable.Lisinopril 5 mg tablet daily and change furosemide as above.continue to monitor. BMP 02/26/2017.    3. Gastroesophageal reflux disease without esophagitis Stable. Continue on Omeprazole 20 mg capsule daily.   4. Chronic atrial fibrillation HR irregular.Continue on coumadin 5 mg tablet daily. Recent INR 2.4 goal 2-3 due for the next INR 02/23/2017.No signs of bleeding reported. Continue to monitor.    5. Benign prostatic hyperplasia without lower urinary tract symptoms Continue on finasteride 5 mg tablet daily and mirabegron ER 50 mg tablet daily  Family/ staff Communication: Reviewed plan of care with patient and facility Nurse supervisor   Labs/tests ordered:  BMP 02/26/2017. Sandrea Hughs, NP

## 2017-02-20 DIAGNOSIS — E119 Type 2 diabetes mellitus without complications: Secondary | ICD-10-CM | POA: Diagnosis not present

## 2017-02-20 DIAGNOSIS — M79671 Pain in right foot: Secondary | ICD-10-CM | POA: Diagnosis not present

## 2017-02-20 DIAGNOSIS — L602 Onychogryphosis: Secondary | ICD-10-CM | POA: Diagnosis not present

## 2017-02-20 DIAGNOSIS — M79672 Pain in left foot: Secondary | ICD-10-CM | POA: Diagnosis not present

## 2017-02-26 DIAGNOSIS — I10 Essential (primary) hypertension: Secondary | ICD-10-CM | POA: Diagnosis not present

## 2017-02-26 LAB — BASIC METABOLIC PANEL
BUN: 14 (ref 4–21)
Creatinine: 1 (ref 0.6–1.3)
Glucose: 97
Potassium: 4.4 (ref 3.4–5.3)
Sodium: 139 (ref 137–147)

## 2017-03-12 DIAGNOSIS — E118 Type 2 diabetes mellitus with unspecified complications: Secondary | ICD-10-CM | POA: Diagnosis not present

## 2017-03-12 DIAGNOSIS — M549 Dorsalgia, unspecified: Secondary | ICD-10-CM | POA: Diagnosis not present

## 2017-03-12 DIAGNOSIS — Z5181 Encounter for therapeutic drug level monitoring: Secondary | ICD-10-CM | POA: Diagnosis not present

## 2017-03-12 LAB — HEMOGLOBIN A1C: HEMOGLOBIN A1C: 5.5

## 2017-03-13 ENCOUNTER — Encounter: Payer: Self-pay | Admitting: Family

## 2017-03-13 ENCOUNTER — Non-Acute Institutional Stay (SKILLED_NURSING_FACILITY): Payer: Medicare Other | Admitting: Family

## 2017-03-13 DIAGNOSIS — I5032 Chronic diastolic (congestive) heart failure: Secondary | ICD-10-CM | POA: Diagnosis not present

## 2017-03-13 DIAGNOSIS — G4700 Insomnia, unspecified: Secondary | ICD-10-CM | POA: Diagnosis not present

## 2017-03-13 NOTE — Progress Notes (Signed)
Location:  [place] Room Number: 24 Place of Service:  SNF (31) Provider: Dinah Ngetich FNP-C  Blanchie Serve, MD  Patient Care Team: Blanchie Serve, MD as PCP - General (Internal Medicine) Carlena Bjornstad, MD (Cardiology) Earnie Larsson, MD (Neurosurgery) Rutherford Guys, MD (Ophthalmology) Renda Rolls, Jennefer Bravo, MD as Referring Physician (Dermatology) Alexis Frock, MD as Consulting Physician (Urology) Dorothy Spark, MD as Consulting Physician (Cardiology) Ngetich, Nelda Bucks, NP as Nurse Practitioner (Family Medicine)  Extended Emergency Contact Information Primary Emergency Contact: [place], [place] of [place] Phone: [phone] Mobile Phone: [phone] Relation: Friend Secondary Emergency Contact: Ervin Knack of [place] Phone: [phone] Relation: Friend  Code Status:  DNR Goals of care: Advanced Directive information Advanced Directives 03/13/2017  Does Patient Have a Medical Advance Directive? Yes  Type of Paramedic of [place];Out of facility DNR (pink MOST or yellow form);Living will  Does patient want to make changes to medical advance directive? -  Copy of [place] in Chart? Yes  Would patient like information on creating a medical advance directive? -  Pre-existing out of facility DNR order (yellow form or pink MOST form) Yellow form placed in chart (order not valid for inpatient use)     Chief Complaint  Patient presents with  . Acute Visit    muscle spasms and insomnia    HPI:  Pt is a 84 y.o. male seen today at [facility] for an acute visit for evaluation of inability to sleep at night and muscle spasm.He is seen in his room today.He states unable to sleep at night but stays sleepy during the day. He also complains of muscle spasm worst on right leg that also keeps awake at night. He states has also been shortness of  breath. He is worried about walking on facility hallway due to shortness of breath.He has been able to walk without any difficulties.Facility Nurse states patient has been refusing to take his Lasix if SBP in the 120's he has refused diuretics several times. He denies any fever,chills or cough.    Past Medical History:  Diagnosis Date  . ACE-inhibitor cough   . Aortic dissection [facility])    Surgical repair February, 2014  . Aortic stenosis    mild....echo... september... 2010/ mild... echo.[name] KitchenMarland KitchenDec, 2011  . Atrial fibrillation (HCC)     Chronic,   24 hour holter, September, 2011.... atrial fib rate is controlled.... there is some bradycardia but  no marked pauses  . Bladder cancer [facility])    recurrence with TUR-B March '09  . DDD (degenerative disc disease)    lumbar spine  . Diabetes mellitus    type 2  . Diabetes mellitus with neuropathy ([place]) 12/04/2006   Qualifier: Diagnosis of  By: Linda Hedges MD, Heinz Knuckles  medications - DPP4   . Diverticulosis of colon with hemorrhage 01/01/2014  . Ejection fraction    EF 60%, echo, 01/2010  . Fluid overload   . Fracture of metacarpal bone 11/30/11  . GERD (gastroesophageal reflux disease)   . Gout   . History of echocardiogram    Echo 5/17 - mild LVH, EF 55-60%, mod AS (mean 16 mmHg, peak 29 mmHg), MAC, midl MR, massive BAE, mod dilated RV, low normal RVSF, mod TR, PASP 64 mmHg  . Hx of colonoscopy    approx. 10 years  . Hypercholesterolemia   . Hyperlipidemia   .  Hypertension   . Mitral regurgitation   . Normal nuclear stress test    06/2005 , also ABI normal 2007  . Prostate cancer ([place])    radiation tx.  . Pulmonary hypertension ([place])    16mHg, echo, 01/2010  . Shortness of breath    on exertion  . Warfarin anticoagulation    Atrial fib  . Whooping cough    Past Surgical History:  Procedure Laterality Date  . ASCENDING AORTIC ROOT REPLACEMENT N/A 04/15/2012   Procedure: ASCENDING AORTIC ROOT REPLACEMENT;  Surgeon: BGaye Pollack MD;   [place]  Service: Open Heart Surgery;  Laterality: N/A;  . bladder cancer biopsy  10/16/2010   negative for malignancy  . bladder microscopic  04/13/2007   high grade papillary urothelial lesions  . COLONOSCOPY N/A 01/01/2014   Procedure: COLONOSCOPY;  Surgeon: CGatha Mayer MD;  Location: [facility];  Service: Endoscopy;  Laterality: N/A;  . CYSTOSCOPY/RETROGRADE/URETEROSCOPY Bilateral 04/04/2013   Procedure: CYSTOSCOPY WITH RETROGRADE PYELOGRAM AND BLADDER BIOPSY;  Surgeon: DMolli Hazard MD;  Location: [facility] ORS;  Service: Urology;  Laterality: Bilateral;  . 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 EXPLORATION POST OPERATIVE OPEN HEART  04/2012  . Herniatic disc surgery    . IR KYPHO LUMBAR INC FX REDUCE BONE BX UNI/BIL CANNULATION INC/IMAGING  07/25/2016  . LUMBAR LAMINECTOMY     '02  . PROSTATE BIOPSY  09/25/2011   GLEASON 3+3=6 AND 4+3=7  . TEE WITHOUT CARDIOVERSION N/A 04/15/2012   Procedure: TRANSESOPHAGEAL ECHOCARDIOGRAM (TEE);  Surgeon: BGaye Pollack MD;  [place]  Service: Open Heart Surgery;  Laterality: N/A;  . TRANSURETHRAL RESECTION OF BLADDER TUMOR N/A 06/13/2015   Procedure: TRANSURETHRAL RESECTION OF PROSTATE (TURP) CLOT EVACUATION AND FULGERATION, BLADDER STONE REMOVAL ;  Surgeon: TAlexis Frock MD;  Location: [facility];  Service: Urology;  Laterality: N/A;  . TUR-BT '06, '09      Allergies  Allergen Reactions  . Metoprolol Other (See Comments)    Reaction:  Bradycardia with 273mBID dose    Outpatient Encounter Medications as of 03/13/2017  Medication Sig  . acetaminophen (TYLENOL) 500 MG tablet Take 500 mg by mouth every 8 (eight) hours as needed for mild pain, moderate pain, fever or headache.   . allopurinol (ZYLOPRIM) 300 MG tablet take 1 tablet by mouth once daily  . atorvastatin (LIPITOR) 10 MG tablet Take 10 mg by mouth daily.  . Calcium Carbonate-Vitamin D (CALCIUM 600+D) 600-400 MG-UNIT tablet Take 1  tablet by mouth at bedtime.  . [name] Kitchenesonide (DESOWEN) 0.05 % lotion Apply 1 application topically daily as needed (rosacea).   . docusate sodium (COLACE) 100 MG capsule Take 1 capsule (100 mg total) by mouth every 12 (twelve) hours.  . finasteride (PROSCAR) 5 MG tablet Take 1 tablet (5 mg total) by mouth every morning.  . furosemide (LASIX) 20 MG tablet Take 10 mg by mouth 2 (two) times daily. Hold for SBP <110  . gabapentin (NEURONTIN) 100 MG capsule Take 1 capsule (100 mg total) by mouth 3 (three) times daily.  . [name] Kitchenetoconazole (NIZORAL) 2 % shampoo Apply 1 application topically as needed.   . [name] Kitchenisinopril (PRINIVIL,ZESTRIL) 5 MG tablet Take 5 mg by mouth daily.  . mirabegron ER (MYRBETRIQ) 50 MG TB24 tablet Take 50 mg by mouth daily.  . Multiple Vitamin (THERAPEUTIC MULTIVITAMIN PO) Take 1 tablet by mouth daily.  . [name] Kitchenmeprazole (PRILOSEC) 20 MG capsule Take 20 mg by  mouth daily.  . polyethylene glycol (MIRALAX / GLYCOLAX) packet Take 17 g by mouth daily as needed.  . potassium chloride (K-DUR,KLOR-CON) 10 MEQ tablet take 1 tablet by mouth once daily  . senna (SENOKOT) 8.6 MG tablet Take 1 tablet by mouth at bedtime.   . Tamsulosin HCl (FLOMAX) 0.4 MG CAPS Take 0.4 mg by mouth at bedtime.   [name] Kitchen tiZANidine (ZANAFLEX) 4 MG capsule Take 4 mg every 8 (eight) hours as needed by mouth for muscle spasms.  . traMADol (ULTRAM) 50 MG tablet Take 1 tablet (50 mg total) by mouth every 6 (six) hours as needed for moderate pain.  [name] Kitchen warfarin (COUMADIN) 5 MG tablet Take 5 mg daily by mouth. Wednesday and Thursday  . warfarin (COUMADIN) 6 MG tablet Take 6 mg daily by mouth. Sunday, Monday, Tuesday, Friday and Saturday   No facility-administered encounter medications on file as of 03/13/2017.     Review of Systems  Constitutional: Negative for activity change, chills, fatigue and fever.  Respiratory: Positive for shortness of breath. Negative for cough, chest tightness and wheezing.   Cardiovascular: Positive for leg  swelling. Negative for chest pain and palpitations.  Gastrointestinal: Negative for abdominal distention, abdominal pain, constipation, diarrhea, nausea and vomiting.  Musculoskeletal: Positive for gait problem.       Muscle spasm worst on right leg   Skin: Negative for color change, pallor and rash.  Neurological: Negative for dizziness, light-headedness and headaches.  Psychiatric/Behavioral: Positive for sleep disturbance. Negative for agitation and confusion. The patient is not nervous/anxious.     Immunization History  Administered Date(s) Administered  . H1N1 03/07/2008  . Influenza Split 12/04/2010, 11/10/2011  . Influenza Whole 12/06/2007, 11/29/2008, 11/12/2009  . Influenza, High Dose Seasonal PF 12/02/2012  . Influenza,inj,Quad PF,6+ Mos 10/26/2013  . Influenza-Unspecified 11/10/2014, 12/04/2015, 12/11/2016  . Pneumococcal Conjugate-13 04/11/2013  . Pneumococcal Polysaccharide-23 06/03/2006  . Td 03/07/2008  . Zoster 11/14/2009   Pertinent  Health Maintenance Due  Topic Date Due  . OPHTHALMOLOGY EXAM  11/30/2015  . FOOT EXAM  01/14/2018 (Originally 01/14/2017)  . HEMOGLOBIN A1C  05/13/2017  . INFLUENZA VACCINE  Completed  . PNA vac Low Risk Adult  Completed   Fall Risk  01/02/2017 07/07/2016 01/15/2016 01/04/2016 10/19/2015  Falls in the past year? Yes No Yes Yes No  Number falls in past yr: 2 or more - 1 1 -  Comment - - - - -  Injury with Fall? Yes - No No -  Comment - - - - -  Risk for fall due to : - - Impaired balance/gait;Impaired mobility - -  Follow up - - Falls prevention discussed - -    Vitals:   03/13/17 1401  BP: 108/60  Pulse: 60  Resp: 20  Temp: (!) 96.9 F (36.1 C)  SpO2: 98%  Weight: 188 lb 6.4 oz (85.5 kg)  Height: _0  (1.727 m)   Body mass index is 28.65 kg/m. Physical Exam  Constitutional: He is oriented to person, place, and time. He appears well-developed and well-nourished. No distress.  Elderly in no acute distress   HENT:  Head:  Normocephalic.  Mouth/Throat: Oropharynx is clear and moist. No oropharyngeal exudate.  Eyes: Conjunctivae and EOM are normal. Pupils are equal, round, and reactive to light. Right eye exhibits no discharge. Left eye exhibits no discharge. No scleral icterus.  Neck: Normal range of motion. No JVD present. No thyromegaly present.  Cardiovascular: Intact distal pulses. Exam reveals no gallop and no friction  rub.  Murmur heard. Pulmonary/Chest: Breath sounds normal. No respiratory distress. He has no wheezes. He has no rales.  Shortness of breath with exertion   Abdominal: Soft. Bowel sounds are normal. He exhibits no distension. There is no tenderness. There is no rebound and no guarding.  Musculoskeletal: He exhibits no tenderness.  Moves x 4 extremities.uses FWW.bilateral lower extremities 2+ edema.   Lymphadenopathy:    He has no cervical adenopathy.  Neurological: He is oriented to person, place, and time. Coordination normal.  Skin: Skin is warm and dry. No rash noted. No erythema.  Psychiatric: He has a normal mood and affect.    Labs reviewed: Recent Labs    07/22/16 0531  07/26/16 0537  07/27/16 0511  07/28/16 0520 08/04/16 10/06/16  NA 137   < > 139   < > 138   < > 140 138 141  K 3.6   < > 3.5   < > 3.8  --  3.7 4.1 3.7  CL 99*   < > 100*  --  99*  --  99*  --   --   CO2 30   < > 28  --  30  --  30  --   --   GLUCOSE 126*   < > 112*  --  114*  --  117*  --   --   BUN 15   < > 22*   < > 24*   < > 24* 17 13  CREATININE 0.93   < > 0.77   < > 1.01   < > 0.97 0.9 1.2  CALCIUM 9.2   < > 9.7  --  9.5  --  9.7  --   --   MG 1.7  --  1.7  --   --   --  1.7  --   --    < > = values in this interval not displayed.   Recent Labs    04/07/16 1311 07/07/16 1725 07/10/16 1546  AST 21 25 38  ALT _0 ALKPHOS 96 80 81  BILITOT 1.49* 0.9 1.8*  PROT 8.0 7.2 7.9  ALBUMIN 4.2 4.4 4.4   Recent Labs    07/10/16 1546  07/20/16 0505  07/25/16 0527  07/26/16 0537   07/27/16 0511 07/28/16 07/28/16 0520 08/04/16  WBC 15.9*   < > 12.2*   < > 11.8*   < > 9.9   < > 10.0 9.1 9.1 9.8  NEUTROABS 13.6*  --  9.7*  --  10.4*  --   --   --   --   --   --   --   HGB 14.3   < > 13.2   < > 12.7*   < > 13.0   < > 14.0  --  14.0 13.7  HCT 41.7   < > 40.6   < > 38.6*   < > 39.6   < > 44.1  --  42.5 42  MCV 92.3   < > 95.3   < > 94.8  --  94.3  --  95.2  --  95.3  --   PLT 252   < > 197   < > 216   < > 207   < > 225  --  227 241   < > = values in this interval not displayed.   Lab Results  Component Value Date   TSH 1.48 04/10/2015   Lab Results  Component Value Date   HGBA1C 5.2 11/13/2016   Lab Results  Component Value Date   CHOL 91 10/06/2016   HDL 30 (A) 10/06/2016   LDLCALC 42 10/06/2016   TRIG 107 10/06/2016   CHOLHDL 2 04/10/2015    Significant Diagnostic Results in last 30 days:  No results found.  Assessment/Plan 1. Insomnia Inability to sleep during the night since trazodone was discontinued.will restart Trazodone 50 mg tablet Take 1/2 Tablet daily at bedtime. Continue to monitor.   2. Chronic diastolic CHF (congestive heart failure) Has had shortness of breath with exertion unable to walk on hallway and back to his room as usual.Weight has been stable except now has 2+ edema to lower extremities. Has been refusing diuretics every now and then whenever his SBP in the 120's per advice from a friend who told him he shouldn't take diuretic unless SBP > 138.Encouraged to take diuretic due to shortness of breath.Increase Furosemide to 20 mg tablet twice daily at 9 Am and 2 PM.Continue to monitor weight. Fluid restriction.Recheck CMP 03/19/2017.    Family/ staff Communication: Reviewed plan of care with patient and facility Nurse.  Labs/tests ordered: Recheck CMP 03/19/2017.    Sandrea Hughs, NP

## 2017-03-19 DIAGNOSIS — I10 Essential (primary) hypertension: Secondary | ICD-10-CM | POA: Diagnosis not present

## 2017-03-19 LAB — COMPLETE METABOLIC PANEL WITH GFR
ALT: 9
AST: 16
Albumin: 3.3
Alkaline Phosphatase: 67
BUN: 12 (ref 4–21)
CREATININE: 0.96
Calcium: 8.9
Glucose: 100
Potassium: 4
Sodium: 141
Total Bilirubin: 1.1
Total Protein: 5.6 g/dL

## 2017-03-20 ENCOUNTER — Encounter: Payer: Self-pay | Admitting: *Deleted

## 2017-03-23 ENCOUNTER — Non-Acute Institutional Stay (SKILLED_NURSING_FACILITY): Payer: Medicare Other | Admitting: Internal Medicine

## 2017-03-23 ENCOUNTER — Encounter: Payer: Self-pay | Admitting: Internal Medicine

## 2017-03-23 DIAGNOSIS — I5032 Chronic diastolic (congestive) heart failure: Secondary | ICD-10-CM

## 2017-03-23 DIAGNOSIS — I5033 Acute on chronic diastolic (congestive) heart failure: Secondary | ICD-10-CM

## 2017-03-23 DIAGNOSIS — F5104 Psychophysiologic insomnia: Secondary | ICD-10-CM | POA: Diagnosis not present

## 2017-03-23 DIAGNOSIS — I110 Hypertensive heart disease with heart failure: Secondary | ICD-10-CM | POA: Diagnosis not present

## 2017-03-23 DIAGNOSIS — R791 Abnormal coagulation profile: Secondary | ICD-10-CM | POA: Diagnosis not present

## 2017-03-23 DIAGNOSIS — N3281 Overactive bladder: Secondary | ICD-10-CM | POA: Insufficient documentation

## 2017-03-23 DIAGNOSIS — R040 Epistaxis: Secondary | ICD-10-CM | POA: Diagnosis not present

## 2017-03-23 NOTE — Progress Notes (Signed)
Location:  [facility] Room Number: 24 Place of Service:  SNF [phone]) Provider:  Blanchie Serve MD  Blanchie Serve, MD  Patient Care Team: Blanchie Serve, MD as PCP - General (Internal Medicine) Carlena Bjornstad, MD (Cardiology) Earnie Larsson, MD (Neurosurgery) Rutherford Guys, MD (Ophthalmology) Renda Rolls, Jennefer Bravo, MD as Referring Physician (Dermatology) Alexis Frock, MD as Consulting Physician (Urology) Dorothy Spark, MD as Consulting Physician (Cardiology) Ngetich, Nelda Bucks, NP as Nurse Practitioner (Family Medicine)  Extended Emergency Contact Information Primary Emergency Contact: [place], [place] of [place] Phone: [phone] Mobile Phone: [phone] Relation: Friend Secondary Emergency Contact: Ervin Knack of [place] Phone: [phone] Relation: Friend  Code Status:  DNR  Goals of care: Advanced Directive information Advanced Directives 03/23/2017  Does Patient Have a Medical Advance Directive? Yes  Type of Paramedic of [place];Living will;Out of facility DNR (pink MOST or yellow form)  Does patient want to make changes to medical advance directive? No - Patient declined  Copy of [place] in Chart? Yes  Would patient like information on creating a medical advance directive? -  Pre-existing out of facility DNR order (yellow form or pink MOST form) Yellow form placed in chart (order not valid for inpatient use)     Chief Complaint  Patient presents with  . Medical Management of Chronic Issues    Routine Visit     HPI:  Pt is a 84 y.o. male seen today for medical management of chronic diseases. He is sleeping better since trazodone has been started. For past few weeks, he wakes up in a dark room thinking there is a person at corner of his room or chair looking at him. This morning, he got nervous and called for help. He denies hearing  any voices or seeing more than a person. No other health concern from patient. He does have some dyspnea with exertion. Per nursing, he has refused his diuretic at times. No fall reported.   Past Medical History:  Diagnosis Date  . ACE-inhibitor cough   . Aortic dissection [facility]    Surgical repair February, 2014  . Aortic stenosis    mild....echo... september... 2010/ mild... echo.[name] KitchenMarland KitchenDec, 2011  . Atrial fibrillation (HCC)     Chronic,   24 hour holter, September, 2011.... atrial fib rate is controlled.... there is some bradycardia but  no marked pauses  . Bladder cancer [facility])    recurrence with TUR-B March '09  . DDD (degenerative disc disease)    lumbar spine  . Diabetes mellitus    type 2  . Diabetes mellitus with neuropathy ([place]) 12/04/2006   Qualifier: Diagnosis of  By: Linda Hedges MD, Heinz Knuckles  medications - DPP4   . Diverticulosis of colon with hemorrhage 01/01/2014  . Ejection fraction    EF 60%, echo, 01/2010  . Fluid overload   . Fracture of metacarpal bone 11/30/11  . GERD (gastroesophageal reflux disease)   . Gout   . History of echocardiogram    Echo 5/17 - mild LVH, EF 55-60%, mod AS (mean 16 mmHg, peak 29 mmHg), MAC, midl MR, massive BAE, mod dilated RV, low normal RVSF, mod TR, PASP 64 mmHg  . Hx of colonoscopy    approx. 10 years  . Hypercholesterolemia   . Hyperlipidemia   . Hypertension   . Mitral regurgitation   . Normal nuclear stress test  06/2005 , also ABI normal 2007  . Prostate cancer ([place])    radiation tx.  . Pulmonary hypertension ([place])    85mHg, echo, 01/2010  . Shortness of breath    on exertion  . Warfarin anticoagulation    Atrial fib  . Whooping cough    Past Surgical History:  Procedure Laterality Date  . ASCENDING AORTIC ROOT REPLACEMENT N/A 04/15/2012   Procedure: ASCENDING AORTIC ROOT REPLACEMENT;  Surgeon: BGaye Pollack MD;  [place]  Service: Open Heart Surgery;  Laterality: N/A;  . bladder cancer biopsy  10/16/2010    negative for malignancy  . bladder microscopic  04/13/2007   high grade papillary urothelial lesions  . COLONOSCOPY N/A 01/01/2014   Procedure: COLONOSCOPY;  Surgeon: CGatha Mayer MD;  [facility];  Service: Endoscopy;  Laterality: N/A;  . CYSTOSCOPY/RETROGRADE/URETEROSCOPY Bilateral 04/04/2013   Procedure: CYSTOSCOPY WITH RETROGRADE PYELOGRAM AND BLADDER BIOPSY;  Surgeon: DMolli Hazard MD;  Location: [facility];  Service: Urology;  Laterality: Bilateral;  . 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 EXPLORATION POST OPERATIVE OPEN HEART  04/2012  . Herniatic disc surgery    . IR KYPHO LUMBAR INC FX REDUCE BONE BX UNI/BIL CANNULATION INC/IMAGING  07/25/2016  . LUMBAR LAMINECTOMY     '02  . PROSTATE BIOPSY  09/25/2011   GLEASON 3+3=6 AND 4+3=7  . TEE WITHOUT CARDIOVERSION N/A 04/15/2012   Procedure: TRANSESOPHAGEAL ECHOCARDIOGRAM (TEE);  Surgeon: BGaye Pollack MD;  [place]  Service: Open Heart Surgery;  Laterality: N/A;  . TRANSURETHRAL RESECTION OF BLADDER TUMOR N/A 06/13/2015   Procedure: TRANSURETHRAL RESECTION OF PROSTATE (TURP) CLOT EVACUATION AND FULGERATION, BLADDER STONE REMOVAL ;  Surgeon: TAlexis Frock MD;  [facility];  Service: Urology;  Laterality: N/A;  . TUR-BT '06, '09      Allergies  Allergen Reactions  . Metoprolol Other (See Comments)    Reaction:  Bradycardia with 220mBID dose    Outpatient Encounter Medications as of 03/23/2017  Medication Sig  . acetaminophen (TYLENOL) 500 MG tablet Take 500 mg by mouth every 8 (eight) hours as needed for mild pain, moderate pain, fever or headache.   . allopurinol (ZYLOPRIM) 300 MG tablet take 1 tablet by mouth once daily  . atorvastatin (LIPITOR) 10 MG tablet Take 10 mg by mouth daily.  . Calcium Carbonate-Vitamin D (CALCIUM 600+D) 600-400 MG-UNIT tablet Take 1 tablet by mouth at bedtime.  . [name] Kitchenesonide (DESOWEN) 0.05 % lotion Apply 1 application topically daily as  needed (rosacea).   . docusate sodium (COLACE) 100 MG capsule Take 1 capsule (100 mg total) by mouth every 12 (twelve) hours.  . finasteride (PROSCAR) 5 MG tablet Take 1 tablet (5 mg total) by mouth every morning.  . furosemide (LASIX) 20 MG tablet Take 20 mg by mouth 2 (two) times daily. Hold for SBP <110  . gabapentin (NEURONTIN) 100 MG capsule Take 1 capsule (100 mg total) by mouth 3 (three) times daily.  . [name] Kitchenetoconazole (NIZORAL) 2 % shampoo Apply 1 application topically as needed.   . [name] Kitchenisinopril (PRINIVIL,ZESTRIL) 5 MG tablet Take 5 mg by mouth daily.  . mirabegron ER (MYRBETRIQ) 50 MG TB24 tablet Take 50 mg by mouth daily.  . Multiple Vitamin (THERAPEUTIC MULTIVITAMIN PO) Take 1 tablet by mouth daily.  . [name] Kitchenmeprazole (PRILOSEC) 20 MG capsule Take 20 mg by mouth daily.  . polyethylene glycol (MIRALAX / GLYCOLAX) packet Take 17 g by mouth daily  as needed.  . potassium chloride (K-DUR,KLOR-CON) 10 MEQ tablet take 1 tablet by mouth once daily  . senna (SENOKOT) 8.6 MG tablet Take 1 tablet by mouth at bedtime.   . Tamsulosin HCl (FLOMAX) 0.4 MG CAPS Take 0.4 mg by mouth at bedtime.   [name] Kitchen tiZANidine (ZANAFLEX) 4 MG capsule Take 4 mg every 8 (eight) hours as needed by mouth for muscle spasms.  . traMADol (ULTRAM) 50 MG tablet Take 1 tablet (50 mg total) by mouth every 6 (six) hours as needed for moderate pain.  . traZODone (DESYREL) 50 MG tablet Take 25 mg by mouth at bedtime.  . [DISCONTINUED] warfarin (COUMADIN) 5 MG tablet Take 5 mg daily by mouth. Wednesday and Thursday  . [DISCONTINUED] warfarin (COUMADIN) 6 MG tablet Take 6 mg daily by mouth. Sunday, Monday, Tuesday, Friday and Saturday   No facility-administered encounter medications on file as of 03/23/2017.     Review of Systems  Constitutional: Negative for appetite change, chills, fatigue and fever.  HENT: Positive for hearing loss. Negative for congestion, mouth sores, rhinorrhea, sore throat and trouble swallowing.   Respiratory:  Positive for shortness of breath and wheezing. Negative for cough.        Has dyspnea with exertion and some even at rest. Has occassional wheezing.  Cardiovascular: Negative for chest pain and palpitations.  Gastrointestinal: Negative for abdominal pain, constipation, diarrhea, nausea and vomiting.  Genitourinary: Positive for frequency. Negative for dysuria and flank pain.  Musculoskeletal: Positive for arthralgias and gait problem.  Neurological: Negative for dizziness and headaches.  Psychiatric/Behavioral: Positive for sleep disturbance. Negative for behavioral problems and confusion.    Immunization History  Administered Date(s) Administered  . H1N1 03/07/2008  . Influenza Split 12/04/2010, 11/10/2011  . Influenza Whole 12/06/2007, 11/29/2008, 11/12/2009  . Influenza, High Dose Seasonal PF 12/02/2012  . Influenza,inj,Quad PF,6+ Mos 10/26/2013  . Influenza-Unspecified 11/10/2014, 12/04/2015, 12/11/2016  . Pneumococcal Conjugate-13 04/11/2013  . Pneumococcal Polysaccharide-23 06/03/2006  . Td 03/07/2008  . Zoster 11/14/2009   Pertinent  Health Maintenance Due  Topic Date Due  . OPHTHALMOLOGY EXAM  11/30/2015  . FOOT EXAM  01/14/2018 (Originally 01/14/2017)  . HEMOGLOBIN A1C  05/13/2017  . INFLUENZA VACCINE  Completed  . PNA vac Low Risk Adult  Completed   Fall Risk  01/02/2017 07/07/2016 01/15/2016 01/04/2016 10/19/2015  Falls in the past year? Yes No Yes Yes No  Number falls in past yr: 2 or more - 1 1 -  Comment - - - - -  Injury with Fall? Yes - No No -  Comment - - - - -  Risk for fall due to : - - Impaired balance/gait;Impaired mobility - -  Follow up - - Falls prevention discussed - -   Functional Status Survey:    Vitals:   03/23/17 1144  BP: 130/74  Pulse: (!) 55  Resp: 12  Temp: (!) 96.6 F (35.9 C)  TempSrc: Oral  SpO2: 98%  Weight: 188 lb 6.4 oz (85.5 kg)  Height: _0  (1.727 m)   Body mass index is 28.65 kg/m.   Wt Readings from Last 3 Encounters:   03/23/17 188 lb 6.4 oz (85.5 kg)  03/13/17 188 lb 6.4 oz (85.5 kg)  02/17/17 188 lb 9.6 oz (85.5 kg)   Physical Exam  Constitutional: He is oriented to person, place, and time.  Overweight elderly male in no acute distress  HENT:  Head: Normocephalic and atraumatic.  Mouth/Throat: Oropharynx is clear and moist. No oropharyngeal exudate.  Eyes: Conjunctivae and EOM are normal. Pupils are equal, round, and reactive to light. Right eye exhibits no discharge. Left eye exhibits no discharge.  Has corrective glasses  Neck: Normal range of motion. Neck supple.  Cardiovascular:  Murmur heard. Irregular heart rate  Pulmonary/Chest: Effort normal. No respiratory distress. He has no wheezes. He has rales.  Decreased air movement  Abdominal: Soft. Bowel sounds are normal. There is no tenderness.  Musculoskeletal: He exhibits edema and deformity.  Arthritis changes, limited ROM left shoulder, unsteady gait, uses walker and wheelchair for ambulation  Lymphadenopathy:    He has no cervical adenopathy.  Neurological: He is alert and oriented to person, place, and time.  Skin: Skin is warm and dry. He is not diaphoretic.  Psychiatric: He has a normal mood and affect.    Labs reviewed: Recent Labs    07/22/16 0531  07/26/16 0537  07/27/16 0511  07/28/16 0520  01/16/17 02/26/17 03/19/17  NA 137   < > 139   < > 138   < > 140   < > 141 139 141  K 3.6   < > 3.5   < > 3.8  --  3.7   < > 4.0 4.4 4.0  CL 99*   < > 100*  --  99*  --  99*  --   --   --   --   CO2 30   < > 28  --  30  --  30  --   --   --   --   GLUCOSE 126*   < > 112*  --  114*  --  117*  --   --   --   --   BUN 15   < > 22*   < > 24*   < > 24*   < > _0 CREATININE 0.93   < > 0.77   < > 1.01   < > 0.97   < > 0.8 1.0 0.96  CALCIUM 9.2   < > 9.7  --  9.5  --  9.7  --   --   --  8.9  MG 1.7  --  1.7  --   --   --  1.7  --   --   --   --    < > = values in this interval not displayed.   Recent Labs    07/07/16 1725  07/10/16 1546 01/16/17 03/19/17  AST 25 38 18 16  ALT _1 ALKPHOS 80 81 72 67  BILITOT 0.9 1.8*  --  1.1  PROT 7.2 7.9  --  5.6  ALBUMIN 4.4 4.4  --  3.3   Recent Labs    07/10/16 1546  07/20/16 0505  07/25/16 0527  07/26/16 0537  07/27/16 0511 07/28/16 07/28/16 0520 08/04/16  WBC 15.9*   < > 12.2*   < > 11.8*   < > 9.9   < > 10.0 9.1 9.1 9.8  NEUTROABS 13.6*  --  9.7*  --  10.4*  --   --   --   --   --   --   --   HGB 14.3   < > 13.2   < > 12.7*   < > 13.0   < > 14.0  --  14.0 13.7  HCT 41.7   < > 40.6   < > 38.6*   < > 39.6   < >  44.1  --  42.5 42  MCV 92.3   < > 95.3   < > 94.8  --  94.3  --  95.2  --  95.3  --   PLT 252   < > 197   < > 216   < > 207   < > 225  --  227 241   < > = values in this interval not displayed.   Lab Results  Component Value Date   TSH 1.48 04/10/2015   Lab Results  Component Value Date   HGBA1C 5.5 03/12/2017   Lab Results  Component Value Date   CHOL 91 10/06/2016   HDL 30 (A) 10/06/2016   LDLCALC 42 10/06/2016   TRIG 107 10/06/2016   CHOLHDL 2 04/10/2015    Significant Diagnostic Results in last 30 days:  No results found.  Assessment/Plan  Acute on Chronic diastolic CHF With rales on lung exam, dyspnea and leg edema. counseled on importance of medication compliance. Pt agrees. Change furosemide to 40 mg daily for now. Continue KCl 10 meq daily. Continue lisinopril daily. Reassess in 1 week. Daily weight and vital signs for now.   Overactive bladder Continues to have increased urinary frequency, his diuretic and BPH could be contributing some as well. Continue myrbetriq and perineal care.  Essential hypertension Overall stable BP readings on review  With SBP mostly below 130. Highest 153/74. Continue lisinopril 5 mg daily. Monitor for hypotension  Insomnia Has failed GDR. Continue trazodone 25 mg qhs for now.  supratherapeutic INR Had nose bleed last night, mentions having it at times. Currently no bleed. INR today  3.1. Hold warfarin for now and check INR 03/25/17. If INR at goal, consider lowering his warfarin given fluctuation with INR noted on lab review of 1 week.   Epistaxis Last night, no bleed at present. Has supratherapeutic inr, monitor clinically for now. Check cbc   Family/ staff Communication: reviewed care plan with patient and charge nurse.    Labs/tests ordered:  INR 03/25/17, cbc and BMP 03/26/17   I spent 40 minutes in total face-to-face time with the patient, more than 50% of which was spent in counseling and coordination of care, reviewing test results, reviewing medication and discussing or reviewing the diagnosis and treatment plan with patient.     Blanchie Serve, MD Internal Medicine [facility] [address] [place], [place] [address] Cell Phone (Monday-Friday 8 am - 5 pm): [phone] On Call: [phone] and follow prompts after 5 pm and on weekends Office Phone: [phone] Office Fax: [phone]

## 2017-03-26 DIAGNOSIS — I10 Essential (primary) hypertension: Secondary | ICD-10-CM | POA: Diagnosis not present

## 2017-03-26 DIAGNOSIS — D62 Acute posthemorrhagic anemia: Secondary | ICD-10-CM | POA: Diagnosis not present

## 2017-03-26 LAB — CBC
HCT: 34.4
HEMOGLOBIN: 11.4
WBC: 6.4
platelet count: 143

## 2017-03-26 LAB — BASIC METABOLIC PANEL
BUN: 12 (ref 4–21)
CREATININE: 1 (ref 0.6–1.3)
Calcium: 8.9
Creat: 0.95
GLUCOSE: 112
GLUCOSE: 112
POTASSIUM: 3.5 (ref 3.4–5.3)
POTASSIUM: 3.5
Sodium: 141
Sodium: 141 (ref 137–147)

## 2017-03-26 LAB — CBC AND DIFFERENTIAL
HCT: 34 — AB (ref 41–53)
Hemoglobin: 11.4 — AB (ref 13.5–17.5)
Platelets: 143 — AB (ref 150–399)
WBC: 6.4

## 2017-03-27 ENCOUNTER — Encounter: Payer: Self-pay | Admitting: *Deleted

## 2017-03-30 ENCOUNTER — Non-Acute Institutional Stay (SKILLED_NURSING_FACILITY): Payer: Medicare Other | Admitting: Family

## 2017-03-30 ENCOUNTER — Encounter: Payer: Self-pay | Admitting: Family

## 2017-03-30 DIAGNOSIS — I110 Hypertensive heart disease with heart failure: Secondary | ICD-10-CM

## 2017-03-30 DIAGNOSIS — I5032 Chronic diastolic (congestive) heart failure: Secondary | ICD-10-CM | POA: Diagnosis not present

## 2017-03-30 NOTE — Progress Notes (Signed)
Location:  [facility] Room Number: 24 Place of Service:  SNF (31) Provider: Dinah Ngetich FNP-C  Blanchie Serve, MD  Patient Care Team: Blanchie Serve, MD as PCP - General (Internal Medicine) Carlena Bjornstad, MD (Cardiology) Earnie Larsson, MD (Neurosurgery) Rutherford Guys, MD (Ophthalmology) Renda Rolls, Jennefer Bravo, MD as Referring Physician (Dermatology) Alexis Frock, MD as Consulting Physician (Urology) Dorothy Spark, MD as Consulting Physician (Cardiology) Ngetich, Nelda Bucks, NP as Nurse Practitioner (Family Medicine)  Extended Emergency Contact Information Primary Emergency Contact: [place], [place] Phone: [phone] Mobile Phone: [phone] Relation: Friend Secondary Emergency Contact: Ervin Knack of [place] Phone: [phone] Relation: Friend  Code Status:  DNR Goals of care: Advanced Directive information Advanced Directives 03/30/2017  Does Patient Have a Medical Advance Directive? Yes  Type of Paramedic of [place];Out of facility DNR (pink MOST or yellow form);Living will  Does patient want to make changes to medical advance directive? -  Copy of [place] in Chart? Yes  Would patient like information on creating a medical advance directive? -  Pre-existing out of facility DNR order (yellow form or pink MOST form) Yellow form placed in chart (order not valid for inpatient use)     Chief Complaint  Patient presents with  . Acute Visit    shortness of breath    HPI:  Pt is a 84 y.o. male seen today at [facility] for an acute visit for evaluation of shortness of breath.He is seen in the room per facility Nurse request.Nurse reports patient was having shortness ofd breath one day ago.oxygen saturations was 88% on room air.Oxygen was given with much improvement. B/p was 143/103 but B/p now stable.He states has been  unable to walk on facility hallways due to shortness of breath. He denies any fever,chills,chest pain or cough. His weight reviewed 195 lbs (03/30/2017); 195 lbs (03/29/2017); 190 lbs (03/28/2017); 198 lbs (03/27/2017).    Past Medical History:  Diagnosis Date  . ACE-inhibitor cough   . Aortic dissection [facility])    Surgical repair February, 2014  . Aortic stenosis    mild....echo... september... 2010/ mild... echo.[name] KitchenMarland KitchenDec, 2011  . Atrial fibrillation (HCC)     Chronic,   24 hour holter, September, 2011.... atrial fib rate is controlled.... there is some bradycardia but  no marked pauses  . Bladder cancer [facility])    recurrence with TUR-B March '09  . DDD (degenerative disc disease)    lumbar spine  . Diabetes mellitus    type 2  . Diabetes mellitus with neuropathy ([place]) 12/04/2006   Qualifier: Diagnosis of  By: Linda Hedges MD, Heinz Knuckles  medications - DPP4   . Diverticulosis of colon with hemorrhage 01/01/2014  . Ejection fraction    EF 60%, echo, 01/2010  . Fluid overload   . Fracture of metacarpal bone 11/30/11  . GERD (gastroesophageal reflux disease)   . Gout   . History of echocardiogram    Echo 5/17 - mild LVH, EF 55-60%, mod AS (mean 16 mmHg, peak 29 mmHg), MAC, midl MR, massive BAE, mod dilated RV, low normal RVSF, mod TR, PASP 64 mmHg  . Hx of colonoscopy    approx. 10 years  . Hypercholesterolemia   . Hyperlipidemia   . Hypertension   . Mitral regurgitation   . Normal nuclear stress test    06/2005 , also ABI normal 2007  .  Prostate cancer ([place])    radiation tx.  . Pulmonary hypertension ([place])    61mHg, echo, 01/2010  . Shortness of breath    on exertion  . Warfarin anticoagulation    Atrial fib  . Whooping cough    Past Surgical History:  Procedure Laterality Date  . ASCENDING AORTIC ROOT REPLACEMENT N/A 04/15/2012   Procedure: ASCENDING AORTIC ROOT REPLACEMENT;  Surgeon: BGaye Pollack MD;  [facility]  Service: Open Heart Surgery;  Laterality: N/A;  . bladder cancer  biopsy  10/16/2010   negative for malignancy  . bladder microscopic  04/13/2007   high grade papillary urothelial lesions  . COLONOSCOPY N/A 01/01/2014   Procedure: COLONOSCOPY;  Surgeon: CGatha Mayer MD;  Location: [facility];  Service: Endoscopy;  Laterality: N/A;  . CYSTOSCOPY/RETROGRADE/URETEROSCOPY Bilateral 04/04/2013   Procedure: CYSTOSCOPY WITH RETROGRADE PYELOGRAM AND BLADDER BIOPSY;  Surgeon: DMolli Hazard MD;  Location: [facility];  Service: Urology;  Laterality: Bilateral;  . 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 EXPLORATION POST OPERATIVE OPEN HEART  04/2012  . Herniatic disc surgery    . IR KYPHO LUMBAR INC FX REDUCE BONE BX UNI/BIL CANNULATION INC/IMAGING  07/25/2016  . LUMBAR LAMINECTOMY     '02  . PROSTATE BIOPSY  09/25/2011   GLEASON 3+3=6 AND 4+3=7  . TEE WITHOUT CARDIOVERSION N/A 04/15/2012   Procedure: TRANSESOPHAGEAL ECHOCARDIOGRAM (TEE);  Surgeon: BGaye Pollack MD;  [facility]  Service: Open Heart Surgery;  Laterality: N/A;  . TRANSURETHRAL RESECTION OF BLADDER TUMOR N/A 06/13/2015   Procedure: TRANSURETHRAL RESECTION OF PROSTATE (TURP) CLOT EVACUATION AND FULGERATION, BLADDER STONE REMOVAL ;  Surgeon: TAlexis Frock MD;  Location: [facility];  Service: Urology;  Laterality: N/A;  . TUR-BT '06, '09      Allergies  Allergen Reactions  . Metoprolol Other (See Comments)    Reaction:  Bradycardia with 282mBID dose    Outpatient Encounter Medications as of 03/30/2017  Medication Sig  . acetaminophen (TYLENOL) 500 MG tablet Take 500 mg by mouth every 8 (eight) hours as needed for mild pain, moderate pain, fever or headache.   . allopurinol (ZYLOPRIM) 300 MG tablet take 1 tablet by mouth once daily  . atorvastatin (LIPITOR) 10 MG tablet Take 10 mg by mouth daily.  . Calcium Carbonate-Vitamin D (CALCIUM 600+D) 600-400 MG-UNIT tablet Take 1 tablet by mouth at bedtime.  . [name] Kitchenesonide (DESOWEN) 0.05 % lotion Apply 1 application  topically daily as needed (rosacea).   . docusate sodium (COLACE) 100 MG capsule Take 1 capsule (100 mg total) by mouth every 12 (twelve) hours.  . finasteride (PROSCAR) 5 MG tablet Take 1 tablet (5 mg total) by mouth every morning.  . furosemide (LASIX) 40 MG tablet Take 40 mg by mouth daily. Hold for SBP <100  . gabapentin (NEURONTIN) 100 MG capsule Take 1 capsule (100 mg total) by mouth 3 (three) times daily.  . [name] Kitchenetoconazole (NIZORAL) 2 % shampoo Apply 1 application topically as needed.   . [name] Kitchenisinopril (PRINIVIL,ZESTRIL) 5 MG tablet Take 5 mg by mouth daily.  . mirabegron ER (MYRBETRIQ) 50 MG TB24 tablet Take 50 mg by mouth daily.  . Multiple Vitamin (THERAPEUTIC MULTIVITAMIN PO) Take 1 tablet by mouth daily.  . [name] Kitchenmeprazole (PRILOSEC) 20 MG capsule Take 20 mg by mouth daily.  . polyethylene glycol (MIRALAX / GLYCOLAX) packet Take 17 g by mouth daily as needed.  . potassium chloride (K-DUR,KLOR-CON) 10 MEQ tablet take  1 tablet by mouth once daily  . senna (SENOKOT) 8.6 MG tablet Take 1 tablet by mouth at bedtime.   . Tamsulosin HCl (FLOMAX) 0.4 MG CAPS Take 0.4 mg by mouth at bedtime.   [name] Kitchen tiZANidine (ZANAFLEX) 4 MG capsule Take 4 mg every 8 (eight) hours as needed by mouth for muscle spasms.  . traMADol (ULTRAM) 50 MG tablet Take 1 tablet (50 mg total) by mouth every 6 (six) hours as needed for moderate pain.  . traZODone (DESYREL) 50 MG tablet Take 25 mg by mouth at bedtime. Failed GDR  . warfarin (COUMADIN) 3 MG tablet Take 3 mg by mouth daily.  . [DISCONTINUED] furosemide (LASIX) 20 MG tablet Take 20 mg by mouth 2 (two) times daily. Hold for SBP <110   No facility-administered encounter medications on file as of 03/30/2017.     Review of Systems  Constitutional: Negative for activity change, appetite change, chills, fatigue and fever.  Respiratory: Negative for cough, chest tightness and wheezing.        Shortness of breath with exertion   Cardiovascular: Positive for leg swelling.  Negative for chest pain and palpitations.  Gastrointestinal: Negative for abdominal distention, abdominal pain, constipation, diarrhea, nausea and vomiting.  Genitourinary: Negative for dysuria, flank pain and urgency.  Musculoskeletal: Positive for arthralgias and gait problem.  Skin: Negative for color change, pallor and rash.  Neurological: Negative for dizziness, syncope, light-headedness and headaches.  Psychiatric/Behavioral: Negative for agitation and confusion. The patient is not nervous/anxious.     Immunization History  Administered Date(s) Administered  . H1N1 03/07/2008  . Influenza Split 12/04/2010, 11/10/2011  . Influenza Whole 12/06/2007, 11/29/2008, 11/12/2009  . Influenza, High Dose Seasonal PF 12/02/2012  . Influenza,inj,Quad PF,6+ Mos 10/26/2013  . Influenza-Unspecified 11/10/2014, 12/04/2015, 12/11/2016  . Pneumococcal Conjugate-13 04/11/2013  . Pneumococcal Polysaccharide-23 06/03/2006  . Td 03/07/2008  . Zoster 11/14/2009   Pertinent  Health Maintenance Due  Topic Date Due  . OPHTHALMOLOGY EXAM  11/30/2015  . FOOT EXAM  01/14/2018 (Originally 01/14/2017)  . HEMOGLOBIN A1C  09/09/2017  . INFLUENZA VACCINE  Completed  . PNA vac Low Risk Adult  Completed   Fall Risk  01/02/2017 07/07/2016 01/15/2016 01/04/2016 10/19/2015  Falls in the past year? Yes No Yes Yes No  Number falls in past yr: 2 or more - 1 1 -  Comment - - - - -  Injury with Fall? Yes - No No -  Comment - - - - -  Risk for fall due to : - - Impaired balance/gait;Impaired mobility - -  Follow up - - Falls prevention discussed - -   Functional Status Survey:    Vitals:   03/30/17 1104  BP: 114/68  Pulse: 85  Resp: 18  Temp: 97.6 F (36.4 C)  SpO2: 91%  Weight: 195 lb (88.5 kg)  Height: 5' 8"  (1.727 m)   Body mass index is 29.65 kg/m. Physical Exam  Constitutional: He is oriented to person, place, and time. He appears well-developed and well-nourished.  Elderly in no acute distress     HENT:  Head: Normocephalic.  Mouth/Throat: Oropharynx is clear and moist. No oropharyngeal exudate.  Eyes: Conjunctivae and EOM are normal. Pupils are equal, round, and reactive to light. Right eye exhibits no discharge. Left eye exhibits no discharge. No scleral icterus.  Neck: Normal range of motion. No JVD present. No thyromegaly present.  Cardiovascular: Intact distal pulses. Exam reveals no gallop and no friction rub.  Murmur heard. HR irregular  Pulmonary/Chest: Effort normal and breath sounds normal. No respiratory distress. He has no wheezes. He has no rales.  Abdominal: Soft. Bowel sounds are normal. He exhibits no distension. There is no tenderness. There is no rebound and no guarding.  Musculoskeletal:  Moves x 4 extremities.unsteady gait uses walker for ambulation. Bilateral lower extremities 1+ edema.   Lymphadenopathy:    He has no cervical adenopathy.  Neurological: He is oriented to person, place, and time. Coordination normal.  Skin: Skin is warm and dry. No rash noted. No erythema.  Psychiatric: He has a normal mood and affect.   Labs reviewed: Recent Labs    07/22/16 0531  07/26/16 0537  07/27/16 0511  07/28/16 0520  02/26/17 03/19/17 03/26/17  NA 137   < > 139   < > 138   < > 140   < > 139 141 141  K 3.6   < > 3.5   < > 3.8  --  3.7   < > 4.4 4.0 3.5  CL 99*   < > 100*  --  99*  --  99*  --   --   --   --   CO2 30   < > 28  --  30  --  30  --   --   --   --   GLUCOSE 126*   < > 112*  --  114*  --  117*  --   --   --   --   BUN 15   < > 22*   < > 24*   < > 24*   < > 14 12 12   CREATININE 0.93   < > 0.77   < > 1.01   < > 0.97   < > 1.0 0.96 0.95  CALCIUM 9.2   < > 9.7  --  9.5  --  9.7  --   --  8.9 8.9  MG 1.7  --  1.7  --   --   --  1.7  --   --   --   --    < > = values in this interval not displayed.   Recent Labs    07/07/16 1725 07/10/16 1546 01/16/17 03/19/17  AST 25 38 18 16  ALT 16 27 11 9   ALKPHOS 80 81 72 67  BILITOT 0.9 1.8*  --  1.1  PROT  7.2 7.9  --  5.6  ALBUMIN 4.4 4.4  --  3.3   Recent Labs    07/10/16 1546  07/20/16 0505  07/25/16 0527  07/26/16 0537  07/27/16 0511  07/28/16 0520 08/04/16 03/26/17  WBC 15.9*   < > 12.2*   < > 11.8*   < > 9.9   < > 10.0   < > 9.1 9.8 6.4  NEUTROABS 13.6*  --  9.7*  --  10.4*  --   --   --   --   --   --   --   --   HGB 14.3   < > 13.2   < > 12.7*   < > 13.0   < > 14.0  --  14.0 13.7 11.4  HCT 41.7   < > 40.6   < > 38.6*   < > 39.6   < > 44.1  --  42.5 42 34.4  MCV 92.3   < > 95.3   < > 94.8  --  94.3  --  95.2  --  95.3  --   --   PLT 252   < > 197   < > 216   < > 207   < > 225  --  227 241  --    < > = values in this interval not displayed.   Lab Results  Component Value Date   TSH 1.48 04/10/2015   Lab Results  Component Value Date   HGBA1C 5.5 03/12/2017   Lab Results  Component Value Date   CHOL 91 10/06/2016   HDL 30 (A) 10/06/2016   LDLCALC 42 10/06/2016   TRIG 107 10/06/2016   CHOLHDL 2 04/10/2015    Significant Diagnostic Results in last 30 days:  No results found.  Assessment/Plan 1. Chronic diastolic CHF (congestive heart failure) Has had weight gain and dyspnea with exertion.  195 lbs (03/30/2017) 195 lbs (03/29/2017)  190 lbs (03/28/2017) 198 lbs (03/27/2017) Lower extremities 1+ edema. Lungs CTA.continue on Furosemide 40 mg tablet daily in the morning.Add Furosemide 20 mg tablet daily at 2 PM hold for SBP < 100.continue on Lisinopril 5 mg tablet daily and Potassium supplement.recheck BMP in 1 week. Continue to monitor weight.   2. Hypertensive heart disease with chronic diastolic congestive heart failure B/p stable. Continue on Lisinopril 5 mg tablet daily. Change Furosemide as above.   Family/ staff Communication: Reviewed plan of care with patient and facility Nurse supervisor  Labs/tests ordered: BMP in 1 week.   Sandrea Hughs, NP

## 2017-04-02 ENCOUNTER — Non-Acute Institutional Stay (SKILLED_NURSING_FACILITY): Payer: Medicare Other | Admitting: Family

## 2017-04-02 ENCOUNTER — Encounter: Payer: Self-pay | Admitting: Family

## 2017-04-02 DIAGNOSIS — K219 Gastro-esophageal reflux disease without esophagitis: Secondary | ICD-10-CM | POA: Diagnosis not present

## 2017-04-02 DIAGNOSIS — I5032 Chronic diastolic (congestive) heart failure: Secondary | ICD-10-CM | POA: Diagnosis not present

## 2017-04-02 NOTE — Progress Notes (Signed)
Location:  [place] Room Number: 24 Place of Service:  SNF (31) Provider: Dinah Ngetich FNP-C  Blanchie Serve, MD  Patient Care Team: Blanchie Serve, MD as PCP - General (Internal Medicine) Carlena Bjornstad, MD (Cardiology) Earnie Larsson, MD (Neurosurgery) Rutherford Guys, MD (Ophthalmology) Renda Rolls, Jennefer Bravo, MD as Referring Physician (Dermatology) Alexis Frock, MD as Consulting Physician (Urology) Dorothy Spark, MD as Consulting Physician (Cardiology) Ngetich, Nelda Bucks, NP as Nurse Practitioner (Family Medicine)  Extended Emergency Contact Information Primary Emergency Contact: [place], [employer] Phone: [phone] Mobile Phone: [phone] Relation: Friend Secondary Emergency Contact: Ervin Knack of [place] Phone: [phone] Relation: Friend  Code Status:  DNR Goals of care: Advanced Directive information Advanced Directives 04/02/2017  Does Patient Have a Medical Advance Directive? Yes  Type of Paramedic of [place];Out of facility DNR (pink MOST or yellow form);Living will  Does patient want to make changes to medical advance directive? -  Copy of [place] in Chart? Yes  Would patient like information on creating a medical advance directive? -  Pre-existing out of facility DNR order (yellow form or pink MOST form) Yellow form placed in chart (order not valid for inpatient use)     Chief Complaint  Patient presents with  . Acute Visit    vomiting during lunch     HPI:  Pt is a 84 y.o. male seen today at [facility] for an acute visit for evaluation of an episode of vomiting during lunch time.He is seen in his room today.He states had a burning sensation on the chest during meals. He stopped eating but shortly vomited.He states the emesis didn't have any acidic taste.He also states forgot to tell the nurse that he  had had no bowel movement for more than 4 days but bowels moved today prior to visit. He complains of shortness of breath with exertion now afraid to walk with cane thus has gone back to using a walker. He denies any cough,fever,chills or chest pain.Weight log reviewed no recent weight changes.    Past Medical History:  Diagnosis Date  . ACE-inhibitor cough   . Aortic dissection [facility])    Surgical repair February, 2014  . Aortic stenosis    mild....echo... september... 2010/ mild... echo.[name] KitchenMarland KitchenDec, 2011  . Atrial fibrillation (HCC)     Chronic,   24 hour holter, September, 2011.... atrial fib rate is controlled.... there is some bradycardia but  no marked pauses  . Bladder cancer [facility])    recurrence with TUR-B March '09  . DDD (degenerative disc disease)    lumbar spine  . Diabetes mellitus    type 2  . Diabetes mellitus with neuropathy ([place]) 12/04/2006   Qualifier: Diagnosis of  By: Linda Hedges MD, Heinz Knuckles  medications - DPP4   . Diverticulosis of colon with hemorrhage 01/01/2014  . Ejection fraction    EF 60%, echo, 01/2010  . Fluid overload   . Fracture of metacarpal bone 11/30/11  . GERD (gastroesophageal reflux disease)   . Gout   . History of echocardiogram    Echo 5/17 - mild LVH, EF 55-60%, mod AS (mean 16 mmHg, peak 29 mmHg), MAC, midl MR, massive BAE, mod dilated RV, low normal RVSF, mod TR, PASP 64 mmHg  . Hx of colonoscopy    approx. 10 years  . Hypercholesterolemia   . Hyperlipidemia   .  Hypertension   . Mitral regurgitation   . Normal nuclear stress test    06/2005 , also ABI normal 2007  . Prostate cancer ([place])    radiation tx.  . Pulmonary hypertension ([place])    63mHg, echo, 01/2010  . Shortness of breath    on exertion  . Warfarin anticoagulation    Atrial fib  . Whooping cough    Past Surgical History:  Procedure Laterality Date  . ASCENDING AORTIC ROOT REPLACEMENT N/A 04/15/2012   Procedure: ASCENDING AORTIC ROOT REPLACEMENT;  Surgeon: BGaye Pollack MD;   [facility]  Service: Open Heart Surgery;  Laterality: N/A;  . bladder cancer biopsy  10/16/2010   negative for malignancy  . bladder microscopic  04/13/2007   high grade papillary urothelial lesions  . COLONOSCOPY N/A 01/01/2014   Procedure: COLONOSCOPY;  Surgeon: CGatha Mayer MD;  Location: [facility];  Service: Endoscopy;  Laterality: N/A;  . CYSTOSCOPY/RETROGRADE/URETEROSCOPY Bilateral 04/04/2013   Procedure: CYSTOSCOPY WITH RETROGRADE PYELOGRAM AND BLADDER BIOPSY;  Surgeon: DMolli Hazard MD;  Location: [facility];  Service: Urology;  Laterality: Bilateral;  . 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 EXPLORATION POST OPERATIVE OPEN HEART  04/2012  . Herniatic disc surgery    . IR KYPHO LUMBAR INC FX REDUCE BONE BX UNI/BIL CANNULATION INC/IMAGING  07/25/2016  . LUMBAR LAMINECTOMY     '02  . PROSTATE BIOPSY  09/25/2011   GLEASON 3+3=6 AND 4+3=7  . TEE WITHOUT CARDIOVERSION N/A 04/15/2012   Procedure: TRANSESOPHAGEAL ECHOCARDIOGRAM (TEE);  Surgeon: BGaye Pollack MD;  [facility]  Service: Open Heart Surgery;  Laterality: N/A;  . TRANSURETHRAL RESECTION OF BLADDER TUMOR N/A 06/13/2015   Procedure: TRANSURETHRAL RESECTION OF PROSTATE (TURP) CLOT EVACUATION AND FULGERATION, BLADDER STONE REMOVAL ;  Surgeon: TAlexis Frock MD;  Location[facility];  Service: Urology;  Laterality: N/A;  . TUR-BT '06, '09      Allergies  Allergen Reactions  . Metoprolol Other (See Comments)    Reaction:  Bradycardia with 239mBID dose    Outpatient Encounter Medications as of 04/02/2017  Medication Sig  . acetaminophen (TYLENOL) 500 MG tablet Take 500 mg by mouth every 8 (eight) hours as needed for mild pain, moderate pain, fever or headache.   . allopurinol (ZYLOPRIM) 300 MG tablet take 1 tablet by mouth once daily  . atorvastatin (LIPITOR) 10 MG tablet Take 10 mg by mouth daily.  . Calcium Carbonate-Vitamin D (CALCIUM 600+D) 600-400 MG-UNIT tablet Take 1  tablet by mouth at bedtime.  . [name] Kitchenesonide (DESOWEN) 0.05 % lotion Apply 1 application topically daily as needed (rosacea).   . docusate sodium (COLACE) 100 MG capsule Take 1 capsule (100 mg total) by mouth every 12 (twelve) hours.  . finasteride (PROSCAR) 5 MG tablet Take 1 tablet (5 mg total) by mouth every morning.  . furosemide (LASIX) 40 MG tablet Take 40 mg by mouth as directed. Take 4042mt 9AM and 35m49m [facility]PM.[facility] for SBP <100  . gabapentin (NEURONTIN) 100 MG capsule Take 1 capsule (100 mg total) by mouth 3 (three) times daily.  . keMarland Kitchenoconazole (NIZORAL) 2 % shampoo Apply 1 application topically as needed.   . liMarland Kitcheninopril (PRINIVIL,ZESTRIL) 5 MG tablet Take 5 mg by mouth daily.  . mirabegron ER (MYRBETRIQ) 50 MG TB24 tablet Take 50 mg by mouth daily.  . Multiple Vitamin (THERAPEUTIC MULTIVITAMIN PO) Take 1 tablet by mouth daily.  . omMarland Kitchenprazole (PRILOSEC) 20  MG capsule Take 20 mg by mouth daily.  . OXYGEN Inhale 2 L/min into the lungs as needed. For O2 sat <90%  . polyethylene glycol (MIRALAX / GLYCOLAX) packet Take 17 g by mouth daily as needed.  . potassium chloride (K-DUR,KLOR-CON) 10 MEQ tablet take 1 tablet by mouth once daily  . senna (SENOKOT) 8.6 MG tablet Take 1 tablet by mouth at bedtime.   . Tamsulosin HCl (FLOMAX) 0.4 MG CAPS Take 0.4 mg by mouth at bedtime.   [name] Kitchen tiZANidine (ZANAFLEX) 4 MG capsule Take 4 mg every 8 (eight) hours as needed by mouth for muscle spasms.  . traMADol (ULTRAM) 50 MG tablet Take 1 tablet (50 mg total) by mouth every 6 (six) hours as needed for moderate pain.  . traZODone (DESYREL) 50 MG tablet Take 25 mg by mouth at bedtime. Failed GDR  . warfarin (COUMADIN) 3 MG tablet Take 3 mg by mouth daily.   No facility-administered encounter medications on file as of 04/02/2017.     Review of Systems  Constitutional: Negative for chills, fatigue and fever.  Respiratory: Negative for cough, chest tightness and wheezing.        Shortness of breath with exertion.     Cardiovascular: Negative for chest pain, palpitations and leg swelling.  Gastrointestinal: Negative for abdominal distention, abdominal pain, constipation, diarrhea, nausea and vomiting.  Musculoskeletal: Positive for gait problem.  Skin: Negative for color change, pallor and rash.  Neurological: Negative for dizziness, syncope, light-headedness and headaches.  Psychiatric/Behavioral: Negative for agitation, confusion and sleep disturbance. The patient is not nervous/anxious.     Immunization History  Administered Date(s) Administered  . H1N1 03/07/2008  . Influenza Split 12/04/2010, 11/10/2011  . Influenza Whole 12/06/2007, 11/29/2008, 11/12/2009  . Influenza, High Dose Seasonal PF 12/02/2012  . Influenza,inj,Quad PF,6+ Mos 10/26/2013  . Influenza-Unspecified 11/10/2014, 12/04/2015, 12/11/2016  . Pneumococcal Conjugate-13 04/11/2013  . Pneumococcal Polysaccharide-23 06/03/2006  . Td 03/07/2008  . Zoster 11/14/2009   Pertinent  Health Maintenance Due  Topic Date Due  . OPHTHALMOLOGY EXAM  11/30/2015  . FOOT EXAM  01/14/2018 (Originally 01/14/2017)  . HEMOGLOBIN A1C  09/09/2017  . INFLUENZA VACCINE  Completed  . PNA vac Low Risk Adult  Completed   Fall Risk  01/02/2017 07/07/2016 01/15/2016 01/04/2016 10/19/2015  Falls in the past year? Yes No Yes Yes No  Number falls in past yr: 2 or more - 1 1 -  Comment - - - - -  Injury with Fall? Yes - No No -  Comment - - - - -  Risk for fall due to : - - Impaired balance/gait;Impaired mobility - -  Follow up - - Falls prevention discussed - -   Functional Status Survey:    Vitals:   04/02/17 1128  BP: 130/80  Pulse: 71  Resp: 18  Temp: (!) 97.3 F (36.3 C)  SpO2: 94%  Weight: 195 lb (88.5 kg)  Height: _0  (1.727 m)   Body mass index is 29.65 kg/m. Physical Exam  Constitutional: He is oriented to person, place, and time. He appears well-developed and well-nourished.  Elderly in no acute distress   HENT:  Head:  Normocephalic.  Mouth/Throat: Oropharynx is clear and moist. No oropharyngeal exudate.  Eyes: Conjunctivae and EOM are normal. Pupils are equal, round, and reactive to light. Right eye exhibits no discharge. Left eye exhibits no discharge. No scleral icterus.  Neck: Normal range of motion. No JVD present. No thyromegaly present.  Cardiovascular: Intact distal pulses. Exam  reveals no gallop and no friction rub.  Murmur heard. Pulmonary/Chest: Effort normal. No respiratory distress. He has no rales.  Right upper lobe diminished expiration wheeze that cleared with coughing.   Abdominal: Soft. Bowel sounds are normal. He exhibits no distension. There is no tenderness. There is no rebound and no guarding.  Musculoskeletal: He exhibits no edema or tenderness.  Unsteady gait uses FWW.   Lymphadenopathy:    He has no cervical adenopathy.  Neurological: He is oriented to person, place, and time.  Skin: Skin is warm and dry. No rash noted. No erythema. No pallor.  Psychiatric: He has a normal mood and affect. His behavior is normal.   Labs reviewed: Recent Labs    07/22/16 0531  07/26/16 0537  07/27/16 0511  07/28/16 0520  02/26/17 03/19/17 03/26/17  NA 137   < > 139   < > 138   < > 140   < > 139 141 141  K 3.6   < > 3.5   < > 3.8  --  3.7   < > 4.4 4.0 3.5  CL 99*   < > 100*  --  99*  --  99*  --   --   --   --   CO2 30   < > 28  --  30  --  30  --   --   --   --   GLUCOSE 126*   < > 112*  --  114*  --  117*  --   --   --   --   BUN 15   < > 22*   < > 24*   < > 24*   < > _0 CREATININE 0.93   < > 0.77   < > 1.01   < > 0.97   < > 1.0 0.96 0.95  CALCIUM 9.2   < > 9.7  --  9.5  --  9.7  --   --  8.9 8.9  MG 1.7  --  1.7  --   --   --  1.7  --   --   --   --    < > = values in this interval not displayed.   Recent Labs    07/07/16 1725 07/10/16 1546 01/16/17 03/19/17  AST 25 38 18 16  ALT _1 ALKPHOS 80 81 72 67  BILITOT 0.9 1.8*  --  1.1  PROT 7.2 7.9  --  5.6  ALBUMIN  4.4 4.4  --  3.3   Recent Labs    07/10/16 1546  07/20/16 0505  07/25/16 0527  07/26/16 0537  07/27/16 0511  07/28/16 0520 08/04/16 03/26/17  WBC 15.9*   < > 12.2*   < > 11.8*   < > 9.9   < > 10.0   < > 9.1 9.8 6.4  NEUTROABS 13.6*  --  9.7*  --  10.4*  --   --   --   --   --   --   --   --   HGB 14.3   < > 13.2   < > 12.7*   < > 13.0   < > 14.0  --  14.0 13.7 11.4  HCT 41.7   < > 40.6   < > 38.6*   < > 39.6   < > 44.1  --  42.5 42 34.4  MCV 92.3   < > 95.3   < >  94.8  --  94.3  --  95.2  --  95.3  --   --   PLT 252   < > 197   < > 216   < > 207   < > 225  --  227 241  --    < > = values in this interval not displayed.   Lab Results  Component Value Date   TSH 1.48 04/10/2015   Lab Results  Component Value Date   HGBA1C 5.5 03/12/2017   Lab Results  Component Value Date   CHOL 91 10/06/2016   HDL 30 (A) 10/06/2016   LDLCALC 42 10/06/2016   TRIG 107 10/06/2016   CHOLHDL 2 04/10/2015    Significant Diagnostic Results in last 30 days:  No results found.  Assessment/Plan 1. Chronic diastolic CHF (congestive heart failure) No abrupt weight gain.shortness of breath with exertion.Had right upper lobe expiration wheeze that cleared with cough.Trace edema noted.continue to check weight.Fluid restrictions.i discussed with patient the importance of taking the Furosemide as directed.He has agreed to Increase Furosemide to 40 mg tablet one by mouth twice daily at 9 am and 2 PM. Continue on potassium 10 meq tablet.Continue to monitor.BMP in 1 week.     2. Gastroesophageal reflux disease without esophagitis Had burning sensation that resulted in vomiting.No further vomiting reported.continue on omeprazole 20 mg capsule daily.continue to monitor.monitor BMP.   Family/ staff Communication: Reviewed plan of care with patient and facility Nurse.   Labs/tests ordered: BMP in 1 week.  Sandrea Hughs, NP

## 2017-04-06 ENCOUNTER — Non-Acute Institutional Stay (SKILLED_NURSING_FACILITY): Payer: Medicare Other | Admitting: Family

## 2017-04-06 ENCOUNTER — Encounter: Payer: Self-pay | Admitting: Family

## 2017-04-06 DIAGNOSIS — M62838 Other muscle spasm: Secondary | ICD-10-CM

## 2017-04-06 DIAGNOSIS — M79605 Pain in left leg: Secondary | ICD-10-CM

## 2017-04-06 DIAGNOSIS — M79604 Pain in right leg: Secondary | ICD-10-CM

## 2017-04-06 DIAGNOSIS — I10 Essential (primary) hypertension: Secondary | ICD-10-CM | POA: Diagnosis not present

## 2017-04-06 NOTE — Progress Notes (Signed)
Location:  [facility] Room Number: 24 Place of Service:  SNF (31) Provider: Clotile Whittington FNP-C  Blanchie Serve, MD  Patient Care Team: Blanchie Serve, MD as PCP - General (Internal Medicine) Carlena Bjornstad, MD (Cardiology) Earnie Larsson, MD (Neurosurgery) Rutherford Guys, MD (Ophthalmology) Renda Rolls, Jennefer Bravo, MD as Referring Physician (Dermatology) Alexis Frock, MD as Consulting Physician (Urology) Dorothy Spark, MD as Consulting Physician (Cardiology) Adrean Heitz, Nelda Bucks, NP as Nurse Practitioner (Family Medicine)  Extended Emergency Contact Information Primary Emergency Contact: [place], [place] Phone: [phone] Mobile Phone: [phone] Relation: Friend Secondary Emergency Contact: Ervin Knack of [place] Phone: [phone] Relation: Friend  Code Status:  DNR Goals of care: Advanced Directive information Advanced Directives 04/06/2017  Does Patient Have a Medical Advance Directive? Yes  Type of Paramedic of [place];Living will;Out of facility DNR (pink MOST or yellow form)  Does patient want to make changes to medical advance directive? No - Patient declined  Copy of [place] in Chart? Yes  Would patient like information on creating a medical advance directive? -  Pre-existing out of facility DNR order (yellow form or pink MOST form) Yellow form placed in chart (order not valid for inpatient use)     Chief Complaint  Patient presents with  . Acute Visit    Concerns with numbness and tingling to both legs    HPI:  Pt is a 84 y.o. male seen today at [facility] for an acute visit for evaluation of numbness and tingling of legs.He is seen in his room today per facility Nurse request.Nurse reports patient had numbness and tingling of legs overnight.He had Zanaflex and Ultram was given.Patient denies numbness and  tingling states had spasm and pain on both legs.Spasm and pain wakes him up at night.He states leg spasm and pain is located from below the knees and down to the legs. He describes spasm as stiff and pain as achy.He states as long as he takes the Zanaflex and ultram pain is relieved.He states pain also relieved by changing positioned. He states sleeps on the recliner at time.He denies any fever or chills.     Past Medical History:  Diagnosis Date  . ACE-inhibitor cough   . Aortic dissection [facility])    Surgical repair February, 2014  . Aortic stenosis    mild....echo... september... 2010/ mild... echo.[name] KitchenMarland KitchenDec, 2011  . Atrial fibrillation (HCC)     Chronic,   24 hour holter, September, 2011.... atrial fib rate is controlled.... there is some bradycardia but  no marked pauses  . Bladder cancer [facility])    recurrence with TUR-B March '09  . DDD (degenerative disc disease)    lumbar spine  . Diabetes mellitus    type 2  . Diabetes mellitus with neuropathy ([place]) 12/04/2006   Qualifier: Diagnosis of  By: Linda Hedges MD, Heinz Knuckles  medications - DPP4   . Diverticulosis of colon with hemorrhage 01/01/2014  . Ejection fraction    EF 60%, echo, 01/2010  . Fluid overload   . Fracture of metacarpal bone 11/30/11  . GERD (gastroesophageal reflux disease)   . Gout   . History of echocardiogram    Echo 5/17 - mild LVH, EF 55-60%, mod AS (mean 16 mmHg, peak 29 mmHg), MAC, midl MR, massive BAE, mod dilated RV, low normal RVSF, mod TR, PASP 64 mmHg  . Hx  of colonoscopy    approx. 10 years  . Hypercholesterolemia   . Hyperlipidemia   . Hypertension   . Mitral regurgitation   . Normal nuclear stress test    06/2005 , also ABI normal 2007  . Prostate cancer ([place])    radiation tx.  . Pulmonary hypertension ([place])    68mHg, echo, 01/2010  . Shortness of breath    on exertion  . Warfarin anticoagulation    Atrial fib  . Whooping cough    Past Surgical History:  Procedure Laterality Date  . ASCENDING AORTIC  ROOT REPLACEMENT N/A 04/15/2012   Procedure: ASCENDING AORTIC ROOT REPLACEMENT;  Surgeon: BGaye Pollack MD;  Location: [facility]  Service: Open Heart Surgery;  Laterality: N/A;  . bladder cancer biopsy  10/16/2010   negative for malignancy  . bladder microscopic  04/13/2007   high grade papillary urothelial lesions  . COLONOSCOPY N/A 01/01/2014   Procedure: COLONOSCOPY;  Surgeon: CGatha Mayer MD;  Location: [facility];  Service: Endoscopy;  Laterality: N/A;  . CYSTOSCOPY/RETROGRADE/URETEROSCOPY Bilateral 04/04/2013   Procedure: CYSTOSCOPY WITH RETROGRADE PYELOGRAM AND BLADDER BIOPSY;  Surgeon: DMolli Hazard MD;  Location: [facility];  Service: Urology;  Laterality: Bilateral;  . 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 EXPLORATION POST OPERATIVE OPEN HEART  04/2012  . Herniatic disc surgery    . IR KYPHO LUMBAR INC FX REDUCE BONE BX UNI/BIL CANNULATION INC/IMAGING  07/25/2016  . LUMBAR LAMINECTOMY     '02  . PROSTATE BIOPSY  09/25/2011   GLEASON 3+3=6 AND 4+3=7  . TEE WITHOUT CARDIOVERSION N/A 04/15/2012   Procedure: TRANSESOPHAGEAL ECHOCARDIOGRAM (TEE);  Surgeon: BGaye Pollack MD;  [facility] [facility]  Service: Open Heart Surgery;  Laterality: N/A;  . TRANSURETHRAL RESECTION OF BLADDER TUMOR N/A 06/13/2015   Procedure: TRANSURETHRAL RESECTION OF PROSTATE (TURP) CLOT EVACUATION AND FULGERATION, BLADDER STONE REMOVAL ;  Surgeon: TAlexis Frock MD;  Location[facility];  Service: Urology;  Laterality: N/A;  . TUR-BT '06, '09      Allergies  Allergen Reactions  . Metoprolol Other (See Comments)    Reaction:  Bradycardia with 221mBID dose    Outpatient Encounter Medications as of 04/06/2017  Medication Sig  . acetaminophen (TYLENOL) 500 MG tablet Take 500 mg by mouth every 8 (eight) hours as needed for mild pain, moderate pain, fever or headache.   . allopurinol (ZYLOPRIM) 300 MG tablet take 1 tablet by mouth once daily  . atorvastatin (LIPITOR) 10 MG  tablet Take 10 mg by mouth daily.  . Calcium Carbonate-Vitamin D (CALCIUM 600+D) 600-400 MG-UNIT tablet Take 1 tablet by mouth at bedtime.  . [name] Kitchenesonide (DESOWEN) 0.05 % lotion Apply 1 application topically daily as needed (rosacea).   . docusate sodium (COLACE) 100 MG capsule Take 1 capsule (100 mg total) by mouth every 12 (twelve) hours.  . finasteride (PROSCAR) 5 MG tablet Take 1 tablet (5 mg total) by mouth every morning.  . furosemide (LASIX) 40 MG tablet Take 40 mg by mouth 2 (two) times daily.   . [name] Kitchenabapentin (NEURONTIN) 100 MG capsule Take 1 capsule (100 mg total) by mouth 3 (three) times daily.  . [name] Kitchenetoconazole (NIZORAL) 2 % shampoo Apply 1 application topically as needed.   . [name] Kitchenisinopril (PRINIVIL,ZESTRIL) 5 MG tablet Take 5 mg by mouth daily.  . mirabegron ER (MYRBETRIQ) 50 MG TB24 tablet Take 50 mg by mouth daily.  . Multiple Vitamin (THERAPEUTIC MULTIVITAMIN PO) Take 1  tablet by mouth daily.  [name] Kitchen omeprazole (PRILOSEC) 20 MG capsule Take 20 mg by mouth daily.  . polyethylene glycol (MIRALAX / GLYCOLAX) packet Take 17 g by mouth daily as needed.  . potassium chloride (K-DUR,KLOR-CON) 10 MEQ tablet take 1 tablet by mouth once daily  . senna (SENOKOT) 8.6 MG tablet Take 1 tablet by mouth at bedtime.   . Tamsulosin HCl (FLOMAX) 0.4 MG CAPS Take 0.4 mg by mouth at bedtime.   [name] Kitchen tiZANidine (ZANAFLEX) 4 MG capsule Take 4 mg every 8 (eight) hours as needed by mouth for muscle spasms.  . traMADol (ULTRAM) 50 MG tablet Take 1 tablet (50 mg total) by mouth every 6 (six) hours as needed for moderate pain.  . traZODone (DESYREL) 50 MG tablet Take 25 mg by mouth at bedtime. Failed GDR  . warfarin (COUMADIN) 3 MG tablet Take 3 mg by mouth daily.  . OXYGEN Inhale 2 L/min into the lungs as needed. For O2 sat <90%   No facility-administered encounter medications on file as of 04/06/2017.     Review of Systems  Constitutional: Negative for activity change, chills, fatigue and fever.  HENT: Negative for  congestion, rhinorrhea, sinus pressure, sinus pain, sneezing and sore throat.   Respiratory: Negative for cough, chest tightness, shortness of breath and wheezing.   Cardiovascular: Negative for chest pain, palpitations and leg swelling.  Gastrointestinal: Negative for abdominal distention, abdominal pain, constipation, diarrhea, nausea and vomiting.  Musculoskeletal: Positive for arthralgias and gait problem.       Leg pain   Skin: Negative for color change, pallor and rash.  Neurological: Negative for numbness.       Leg spasm worst at night   Psychiatric/Behavioral: Negative for agitation and confusion. The patient is not nervous/anxious.     Immunization History  Administered Date(s) Administered  . H1N1 03/07/2008  . Influenza Split 12/04/2010, 11/10/2011  . Influenza Whole 12/06/2007, 11/29/2008, 11/12/2009  . Influenza, High Dose Seasonal PF 12/02/2012  . Influenza,inj,Quad PF,6+ Mos 10/26/2013  . Influenza-Unspecified 11/10/2014, 12/04/2015, 12/11/2016  . Pneumococcal Conjugate-13 04/11/2013  . Pneumococcal Polysaccharide-23 06/03/2006  . Td 03/07/2008  . Zoster 11/14/2009   Pertinent  Health Maintenance Due  Topic Date Due  . OPHTHALMOLOGY EXAM  11/30/2015  . FOOT EXAM  01/14/2018 (Originally 01/14/2017)  . HEMOGLOBIN A1C  09/09/2017  . INFLUENZA VACCINE  Completed  . PNA vac Low Risk Adult  Completed   Fall Risk  01/02/2017 07/07/2016 01/15/2016 01/04/2016 10/19/2015  Falls in the past year? Yes No Yes Yes No  Number falls in past yr: 2 or more - 1 1 -  Comment - - - - -  Injury with Fall? Yes - No No -  Comment - - - - -  Risk for fall due to : - - Impaired balance/gait;Impaired mobility - -  Follow up - - Falls prevention discussed - -    Vitals:   04/06/17 1054  BP: 130/70  Pulse: (!) 59  Resp: 18  Temp: (!) 96.7 F (35.9 C)  TempSrc: Oral  SpO2: 92%  Weight: 189 lb (85.7 kg)  Height: _0  (1.727 m)   Body mass index is 28.74 kg/m. Physical Exam    Constitutional: He is oriented to person, place, and time. He appears well-developed and well-nourished. No distress.  HENT:  Head: Normocephalic.  Mouth/Throat: Oropharynx is clear and moist. No oropharyngeal exudate.  Eyes: Conjunctivae and EOM are normal. Pupils are equal, round, and reactive to light. Right eye exhibits  no discharge. Left eye exhibits no discharge. No scleral icterus.  Eye glasses in place   Neck: Normal range of motion. No JVD present. No thyromegaly present.  Cardiovascular: Intact distal pulses. Exam reveals no gallop and no friction rub.  Murmur heard. Irregular heart rate   Pulmonary/Chest: Effort normal and breath sounds normal. No respiratory distress. He has no wheezes. He has no rales.  Abdominal: Soft. Bowel sounds are normal. He exhibits no distension. There is no tenderness. There is no rebound and no guarding.  Genitourinary:  Genitourinary Comments: Incontinent   Musculoskeletal: He exhibits no edema or tenderness.  Moves x 4 extremities.uses FWW.   Lymphadenopathy:    He has no cervical adenopathy.  Neurological: He is oriented to person, place, and time. Coordination normal.  Skin: Skin is warm and dry. No rash noted. No erythema.  Psychiatric: He has a normal mood and affect.    Labs reviewed: Recent Labs    07/22/16 0531  07/26/16 0537  07/27/16 0511  07/28/16 0520  02/26/17 03/19/17 03/26/17  NA 137   < > 139   < > 138   < > 140   < > 139 141 141  141  K 3.6   < > 3.5   < > 3.8  --  3.7   < > 4.4 4.0 3.5  3.5  CL 99*   < > 100*  --  99*  --  99*  --   --   --   --   CO2 30   < > 28  --  30  --  30  --   --   --   --   GLUCOSE 126*   < > 112*  --  114*  --  117*  --   --   --   --   BUN 15   < > 22*   < > 24*   < > 24*   < > _0 CREATININE 0.93   < > 0.77   < > 1.01   < > 0.97   < > 1.0 0.96 1.0  0.95  CALCIUM 9.2   < > 9.7  --  9.5  --  9.7  --   --  8.9 8.9  MG 1.7  --  1.7  --   --   --  1.7  --   --   --   --    < > = values  in this interval not displayed.   Recent Labs    07/07/16 1725 07/10/16 1546 01/16/17 03/19/17  AST 25 38 18 16  ALT _1 ALKPHOS 80 81 72 67  BILITOT 0.9 1.8*  --  1.1  PROT 7.2 7.9  --  5.6  ALBUMIN 4.4 4.4  --  3.3   Recent Labs    07/10/16 1546  07/20/16 0505  07/25/16 0527  07/26/16 0537  07/27/16 0511  07/28/16 0520 08/04/16 03/26/17  WBC 15.9*   < > 12.2*   < > 11.8*   < > 9.9   < > 10.0   < > 9.1 9.8 6.4  6.4  NEUTROABS 13.6*  --  9.7*  --  10.4*  --   --   --   --   --   --   --   --   HGB 14.3   < > 13.2   < > 12.7*   < > 13.0   < >  14.0  --  14.0 13.7 11.4*  11.4  HCT 41.7   < > 40.6   < > 38.6*   < > 39.6   < > 44.1  --  42.5 42 34*  34.4  MCV 92.3   < > 95.3   < > 94.8  --  94.3  --  95.2  --  95.3  --   --   PLT 252   < > 197   < > 216   < > 207   < > 225  --  227 241 143*   < > = values in this interval not displayed.   Lab Results  Component Value Date   TSH 1.48 04/10/2015   Lab Results  Component Value Date   HGBA1C 5.5 03/12/2017   Lab Results  Component Value Date   CHOL 91 10/06/2016   HDL 30 (A) 10/06/2016   LDLCALC 42 10/06/2016   TRIG 107 10/06/2016   CHOLHDL 2 04/10/2015    Significant Diagnostic Results in last 30 days:  No results found.  Assessment/Plan 1. Muscle spasm of both lower legs spasm worst at night relieved by repositioning and Zanaflex effective per patient.Suspect possible restless leg syndrome verse spasm.Will consider initiating Requip if worsening on no relief with Zanaflex.will continue to monitor for now.check BMP and mg level rule out metabolic etiology.   2. Leg pain, bilateral Pain described as achy from knees down to legs.denied numbness or tingling on legs.Relieved with current tramadol regimen.continue to monitor.    Family/ staff Communication: Reviewed plan of care with patient and facility Nurse supervisor  Labs/tests ordered: BMP and mg level 04/09/2017  Sandrea Hughs, NP

## 2017-04-09 DIAGNOSIS — I10 Essential (primary) hypertension: Secondary | ICD-10-CM | POA: Diagnosis not present

## 2017-04-09 DIAGNOSIS — M6281 Muscle weakness (generalized): Secondary | ICD-10-CM | POA: Diagnosis not present

## 2017-04-20 ENCOUNTER — Non-Acute Institutional Stay (SKILLED_NURSING_FACILITY): Payer: Medicare Other | Admitting: Family

## 2017-04-20 ENCOUNTER — Encounter: Payer: Self-pay | Admitting: Family

## 2017-04-20 DIAGNOSIS — E782 Mixed hyperlipidemia: Secondary | ICD-10-CM | POA: Diagnosis not present

## 2017-04-20 DIAGNOSIS — I482 Chronic atrial fibrillation, unspecified: Secondary | ICD-10-CM

## 2017-04-20 DIAGNOSIS — I5032 Chronic diastolic (congestive) heart failure: Secondary | ICD-10-CM

## 2017-04-20 DIAGNOSIS — I110 Hypertensive heart disease with heart failure: Secondary | ICD-10-CM | POA: Diagnosis not present

## 2017-04-20 DIAGNOSIS — R2681 Unsteadiness on feet: Secondary | ICD-10-CM

## 2017-04-24 DIAGNOSIS — L602 Onychogryphosis: Secondary | ICD-10-CM | POA: Diagnosis not present

## 2017-04-24 DIAGNOSIS — M79672 Pain in left foot: Secondary | ICD-10-CM | POA: Diagnosis not present

## 2017-04-24 DIAGNOSIS — M79671 Pain in right foot: Secondary | ICD-10-CM | POA: Diagnosis not present

## 2017-04-24 DIAGNOSIS — E119 Type 2 diabetes mellitus without complications: Secondary | ICD-10-CM | POA: Diagnosis not present

## 2017-05-01 DEATH — deceased

## 2017-05-03 NOTE — Progress Notes (Signed)
Location:  [facility] Room Number: 24 Place of Service:  SNF (31) Provider: Rylynne Schicker FNP-C   Blanchie Serve, MD  Patient Care Team: Blanchie Serve, MD as PCP - General (Internal Medicine) Carlena Bjornstad, MD (Cardiology) Earnie Larsson, MD (Neurosurgery) Rutherford Guys, MD (Ophthalmology) Renda Rolls, Jennefer Bravo, MD as Referring Physician (Dermatology) Alexis Frock, MD as Consulting Physician (Urology) Dorothy Spark, MD as Consulting Physician (Cardiology) Dymin Dingledine, Nelda Bucks, NP as Nurse Practitioner (Family Medicine)  Extended Emergency Contact Information Primary Emergency Contact: [place], [place] of [place] Phone: [phone] Mobile Phone: [phone] Relation: Friend Secondary Emergency Contact: Ervin Knack of [place] Phone: [phone] Relation: Friend  Code Status:  DNR Goals of care: Advanced Directive information Advanced Directives 04/20/2017  Does Patient Have a Medical Advance Directive? Yes  Type of Paramedic of [place];Out of facility DNR (pink MOST or yellow form);Living will  Does patient want to make changes to medical advance directive? -  Copy of [place] in Chart? Yes  Would patient like information on creating a medical advance directive? -  Pre-existing out of facility DNR order (yellow form or pink MOST form) Yellow form placed in chart (order not valid for inpatient use)     Chief Complaint  Patient presents with  . Medical Management of Chronic Issues    routine visit  **needs MOST form**    HPI:  Pt is a 84 y.o. male seen today [facility] for medical management of chronic diseases.He has a medical history of HTN,Afib,CHF,Type 2 DM with Neuropathy,Hyperlipidemia ,DDD,BPH among other conditions.He is seen in his room today with facility Nurse at bedside.He denies any acute issues during visit.He has had  no abrupt weight gain.Facility Nurse states patient has been taking diuretic especially in the mornings.He continues to spends most time in the room walks to the bathroom and back to the recliner.Encouraged to walk on facility hallway.Nurse reports no new concerns.Previous right gluteal pressure ulcer healed.      Past Medical History:  Diagnosis Date  . ACE-inhibitor cough   . Aortic dissection [facility])    Surgical repair February, 2014  . Aortic stenosis    mild....echo... september... 2010/ mild... echo.[name] KitchenMarland KitchenDec, 2011  . Atrial fibrillation (HCC)     Chronic,   24 hour holter, September, 2011.... atrial fib rate is controlled.... there is some bradycardia but  no marked pauses  . Bladder cancer [facility])    recurrence with TUR-B March '09  . DDD (degenerative disc disease)    lumbar spine  . Diabetes mellitus    type 2  . Diabetes mellitus with neuropathy ([place]) 12/04/2006   Qualifier: Diagnosis of  By: Linda Hedges MD, Heinz Knuckles  medications - DPP4   . Diverticulosis of colon with hemorrhage 01/01/2014  . Ejection fraction    EF 60%, echo, 01/2010  . Fluid overload   . Fracture of metacarpal bone 11/30/11  . GERD (gastroesophageal reflux disease)   . Gout   . History of echocardiogram    Echo 5/17 - mild LVH, EF 55-60%, mod AS (mean 16 mmHg, peak 29 mmHg), MAC, midl MR, massive BAE, mod dilated RV, low normal RVSF, mod TR, PASP 64 mmHg  . Hx of colonoscopy    approx. 10 years  . Hypercholesterolemia   . Hyperlipidemia   . Hypertension   . Mitral regurgitation   . Normal nuclear stress  test    06/2005 , also ABI normal 2007  . Prostate cancer ([place])    radiation tx.  . Pulmonary hypertension ([place])    20mHg, echo, 01/2010  . Shortness of breath    on exertion  . Warfarin anticoagulation    Atrial fib  . Whooping cough    Past Surgical History:  Procedure Laterality Date  . ASCENDING AORTIC ROOT REPLACEMENT N/A 04/15/2012   Procedure: ASCENDING AORTIC ROOT REPLACEMENT;  Surgeon: BGaye Pollack MD;  Location: [facility]  Service: Open Heart Surgery;  Laterality: N/A;  . bladder cancer biopsy  10/16/2010   negative for malignancy  . bladder microscopic  04/13/2007   high grade papillary urothelial lesions  . COLONOSCOPY N/A 01/01/2014   Procedure: COLONOSCOPY;  Surgeon: CGatha Mayer MD;  Location: [facility];  Service: Endoscopy;  Laterality: N/A;  . CYSTOSCOPY/RETROGRADE/URETEROSCOPY Bilateral 04/04/2013   Procedure: CYSTOSCOPY WITH RETROGRADE PYELOGRAM AND BLADDER BIOPSY;  Surgeon: DMolli Hazard MD;  Location: [facility];  Service: Urology;  Laterality: Bilateral;  . 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 EXPLORATION POST OPERATIVE OPEN HEART  04/2012  . Herniatic disc surgery    . IR KYPHO LUMBAR INC FX REDUCE BONE BX UNI/BIL CANNULATION INC/IMAGING  07/25/2016  . LUMBAR LAMINECTOMY     '02  . PROSTATE BIOPSY  09/25/2011   GLEASON 3+3=6 AND 4+3=7  . TEE WITHOUT CARDIOVERSION N/A 04/15/2012   Procedure: TRANSESOPHAGEAL ECHOCARDIOGRAM (TEE);  Surgeon: BGaye Pollack MD;  Location: [facility]  Service: Open Heart Surgery;  Laterality: N/A;  . TRANSURETHRAL RESECTION OF BLADDER TUMOR N/A 06/13/2015   Procedure: TRANSURETHRAL RESECTION OF PROSTATE (TURP) CLOT EVACUATION AND FULGERATION, BLADDER STONE REMOVAL ;  Surgeon: TAlexis Frock MD;  Location: [facility];  Service: Urology;  Laterality: N/A;  . TUR-BT '06, '09      Allergies  Allergen Reactions  . Metoprolol Other (See Comments)    Reaction:  Bradycardia with 2101mBID dose    Allergies as of 04/20/2017      Reactions   Metoprolol Other (See Comments)   Reaction:  Bradycardia with 2568mID dose      Medication List        Accurate as of 04/20/17 11:59 PM. Always use your most recent med list.          acetaminophen 500 MG tablet Commonly known as:  TYLENOL Take 500 mg by mouth every 8 (eight) hours as needed for mild pain, moderate pain, fever or headache.   allopurinol 300  MG tablet Commonly known as:  ZYLOPRIM take 1 tablet by mouth once daily   atorvastatin 10 MG tablet Commonly known as:  LIPITOR Take 10 mg by mouth daily.   CALCIUM 600+D 600-400 MG-UNIT tablet Generic drug:  Calcium Carbonate-Vitamin D Take 1 tablet by mouth at bedtime.   desonide 0.05 % lotion Commonly known as:  DESOWEN Apply 1 application topically daily as needed (rosacea).   docusate sodium 100 MG capsule Commonly known as:  COLACE Take 1 capsule (100 mg total) by mouth every 12 (twelve) hours.   finasteride 5 MG tablet Commonly known as:  PROSCAR Take 1 tablet (5 mg total) by mouth every morning.   furosemide 40 MG tablet Commonly known as:  LASIX Take 40 mg by mouth 2 (two) times daily.   gabapentin 100 MG capsule Commonly known as:  NEURONTIN Take 1 capsule (100 mg total) by mouth 3 (three) times  daily.   ketoconazole 2 % shampoo Commonly known as:  NIZORAL Apply 1 application topically as needed.   lisinopril 5 MG tablet Commonly known as:  PRINIVIL,ZESTRIL Take 5 mg by mouth daily.   MYRBETRIQ 50 MG Tb24 tablet Generic drug:  mirabegron ER Take 50 mg by mouth daily.   omeprazole 20 MG capsule Commonly known as:  PRILOSEC Take 20 mg by mouth daily.   OXYGEN Inhale 2 L/min into the lungs as needed. For O2 sat <90%   polyethylene glycol packet Commonly known as:  MIRALAX / GLYCOLAX Take 17 g by mouth daily as needed.   potassium chloride 10 MEQ tablet Commonly known as:  K-DUR,KLOR-CON take 1 tablet by mouth once daily   senna 8.6 MG tablet Commonly known as:  SENOKOT Take 1 tablet by mouth at bedtime.   tamsulosin 0.4 MG Caps capsule Commonly known as:  FLOMAX Take 0.4 mg by mouth at bedtime.   THERAPEUTIC MULTIVITAMIN PO Take 1 tablet by mouth daily.   tiZANidine 4 MG capsule Commonly known as:  ZANAFLEX Take 4 mg every 8 (eight) hours as needed by mouth for muscle spasms.   traMADol 50 MG tablet Commonly known as:  ULTRAM Take 1  tablet (50 mg total) by mouth every 6 (six) hours as needed for moderate pain.   traZODone 50 MG tablet Commonly known as:  DESYREL Take 25 mg by mouth at bedtime. Failed GDR   warfarin 3 MG tablet Commonly known as:  COUMADIN Take 3 mg by mouth daily.       Review of Systems  Constitutional: Negative for appetite change, chills, fatigue and unexpected weight change.  HENT: Negative for congestion, hearing loss, rhinorrhea, sinus pressure, sinus pain, sneezing and sore throat.   Eyes: Positive for visual disturbance. Negative for discharge, redness and itching.       Wears eye glasses  Respiratory: Negative for cough, chest tightness, shortness of breath and wheezing.   Cardiovascular: Negative for chest pain, palpitations and leg swelling.  Gastrointestinal: Negative for abdominal distention, abdominal pain, constipation, diarrhea, nausea and vomiting.  Endocrine: Negative for cold intolerance, heat intolerance, polydipsia, polyphagia and polyuria.  Genitourinary: Negative for dysuria and urgency.  Musculoskeletal: Positive for arthralgias, back pain and gait problem.  Skin: Negative for color change, pallor and rash.  Neurological: Negative for dizziness, light-headedness and headaches.  Hematological: Does not bruise/bleed easily.  Psychiatric/Behavioral: Negative for agitation and sleep disturbance. The patient is not nervous/anxious.     Immunization History  Administered Date(s) Administered  . H1N1 03/07/2008  . Influenza Split 12/04/2010, 11/10/2011  . Influenza Whole 12/06/2007, 11/29/2008, 11/12/2009  . Influenza, High Dose Seasonal PF 12/02/2012  . Influenza,inj,Quad PF,6+ Mos 10/26/2013  . Influenza-Unspecified 11/10/2014, 12/04/2015, 12/11/2016  . Pneumococcal Conjugate-13 04/11/2013  . Pneumococcal Polysaccharide-23 06/03/2006  . Td 03/07/2008  . Zoster 11/14/2009   There are no preventive care reminders to display for this patient. Fall Risk  01/02/2017  07/07/2016 01/15/2016 01/04/2016 10/19/2015  Falls in the past year? Yes No Yes Yes No  Number falls in past yr: 2 or more - 1 1 -  Comment - - - - -  Injury with Fall? Yes - No No -  Comment - - - - -  Risk for fall due to : - - Impaired balance/gait;Impaired mobility - -  Follow up - - Falls prevention discussed - -   Functional Status Survey:    Vitals:   04/20/17 1149  BP: 140/84  Pulse: 61  Resp: 18  Temp: (!) 96.1 F (35.6 C)  SpO2: 95%  Weight: 182 lb 8 oz (82.8 kg)  Height: 5' 8"  (1.727 m)   Body mass index is 27.75 kg/m. Physical Exam  Constitutional: He is oriented to person, place, and time. He appears well-developed.  Elderly in no acute distress   HENT:  Head: Normocephalic.  Right Ear: External ear normal.  Left Ear: External ear normal.  Mouth/Throat: Oropharynx is clear and moist. No oropharyngeal exudate.  Eyes: Conjunctivae and EOM are normal. Pupils are equal, round, and reactive to light. Right eye exhibits no discharge. Left eye exhibits no discharge. No scleral icterus.  Eye glasses in place   Neck: Normal range of motion. No JVD present. No thyromegaly present.  Cardiovascular: Intact distal pulses. An irregular rhythm present. Exam reveals no gallop and no friction rub.  Murmur heard. Pulmonary/Chest: Effort normal and breath sounds normal. No respiratory distress. He has no wheezes. He has no rales.  Abdominal: Soft. Bowel sounds are normal. He exhibits no distension. There is no tenderness. There is no rebound and no guarding.  Musculoskeletal: He exhibits no tenderness.  Moves x 4 extremities.Arthrtic changes to fingers. Unsteady gait uses FWW.Trace edema to bilateral lower extremities.   Lymphadenopathy:    He has no cervical adenopathy.  Neurological: He is oriented to person, place, and time. Coordination normal.  Skin: Skin is warm and dry. No rash noted. No erythema. No pallor.  Right gluteal previous pressure ulcer area scab noted.surrounding  skin tissue dark maroon in color, non-tender to touch.  Psychiatric: He has a normal mood and affect. His behavior is normal.   Labs reviewed: Recent Labs    07/22/16 0531  07/26/16 0537  07/27/16 0511  07/28/16 0520  02/26/17 03/19/17 03/26/17  NA 137   < > 139   < > 138   < > 140   < > 139 141 141  141  K 3.6   < > 3.5   < > 3.8  --  3.7   < > 4.4 4.0 3.5  3.5  CL 99*   < > 100*  --  99*  --  99*  --   --   --   --   CO2 30   < > 28  --  30  --  30  --   --   --   --   GLUCOSE 126*   < > 112*  --  114*  --  117*  --   --   --   --   BUN 15   < > 22*   < > 24*   < > 24*   < > 14 12 12   CREATININE 0.93   < > 0.77   < > 1.01   < > 0.97   < > 1.0 0.96 1.0  0.95  CALCIUM 9.2   < > 9.7  --  9.5  --  9.7  --   --  8.9 8.9  MG 1.7  --  1.7  --   --   --  1.7  --   --   --   --    < > = values in this interval not displayed.   Recent Labs    07/07/16 1725 07/10/16 1546 01/16/17 03/19/17  AST 25 38 18 16  ALT 16 27 11 9   ALKPHOS 80 81 72 67  BILITOT 0.9 1.8*  --  1.1  PROT 7.2  7.9  --  5.6  ALBUMIN 4.4 4.4  --  3.3   Recent Labs    07/10/16 1546  07/20/16 0505  07/25/16 0527  07/26/16 0537  07/27/16 0511  07/28/16 0520 08/04/16 03/26/17  WBC 15.9*   < > 12.2*   < > 11.8*   < > 9.9   < > 10.0   < > 9.1 9.8 6.4  6.4  NEUTROABS 13.6*  --  9.7*  --  10.4*  --   --   --   --   --   --   --   --   HGB 14.3   < > 13.2   < > 12.7*   < > 13.0   < > 14.0  --  14.0 13.7 11.4*  11.4  HCT 41.7   < > 40.6   < > 38.6*   < > 39.6   < > 44.1  --  42.5 42 34*  34.4  MCV 92.3   < > 95.3   < > 94.8  --  94.3  --  95.2  --  95.3  --   --   PLT 252   < > 197   < > 216   < > 207   < > 225  --  227 241 143*   < > = values in this interval not displayed.   Lab Results  Component Value Date   TSH 1.48 04/10/2015   Lab Results  Component Value Date   HGBA1C 5.5 03/12/2017   Lab Results  Component Value Date   CHOL 91 10/06/2016   HDL 30 (A) 10/06/2016   LDLCALC 42 10/06/2016   TRIG  107 10/06/2016   CHOLHDL 2 04/10/2015    Significant Diagnostic Results in last 30 days:  No results found.  Assessment/Plan 1. Hypertensive heart disease with chronic diastolic congestive heart failure  B/p stable at goal.continue on lisinopril 5 mg tablet and furosemide 40 mg tablet twice daily.on potassium supplement.on Warfarin and Statin.continue to monitor BMP.   2. Chronic atrial fibrillation  HR irregular.continue on coumadin.continue to monitor INR   3. Chronic diastolic CHF (congestive heart failure)  No recent abrupt weight changes.exam findings negative for shortness of breath,whezing,cough or rales.bilateral lower extremities edema.continue on Furosemide 40 mg tablet twice daily and Lisinopril 5 mg tablet daily.continue to monitor weight.   4. Mixed hyperlipidemia Continue on atorvastatin 10 mg tablet daily.continue to monitor lipid panel.    5. Unsteady gait Uses FWW.Ambulates from recliner to the bathroom and back.Encouraged to walk on facility hallway but states afraid of falling when walking back to the room due to weakness. Has agrees to work with PT/OT for gait training and muscle strengthening. PT/OT to evaluate and treat as indicated.   Family/ staff Communication: Reviewed plan of care with patient and facility Nurse.  Labs/tests ordered: PT/OT to evaluate and treat as indicated  Sandrea Hughs, NP

## 2017-05-15 ENCOUNTER — Ambulatory Visit: Payer: Medicare Other | Admitting: Cardiology

## 2018-06-18 IMAGING — DX DG CHEST 2V
2 series · 2 of 2 positions shown · non-contrast
Comparison: 06/01/2015

CLINICAL DATA: Two months of gradually worsening shortness of
breath.

EXAM:
CHEST  2 VIEW

[chest pa]
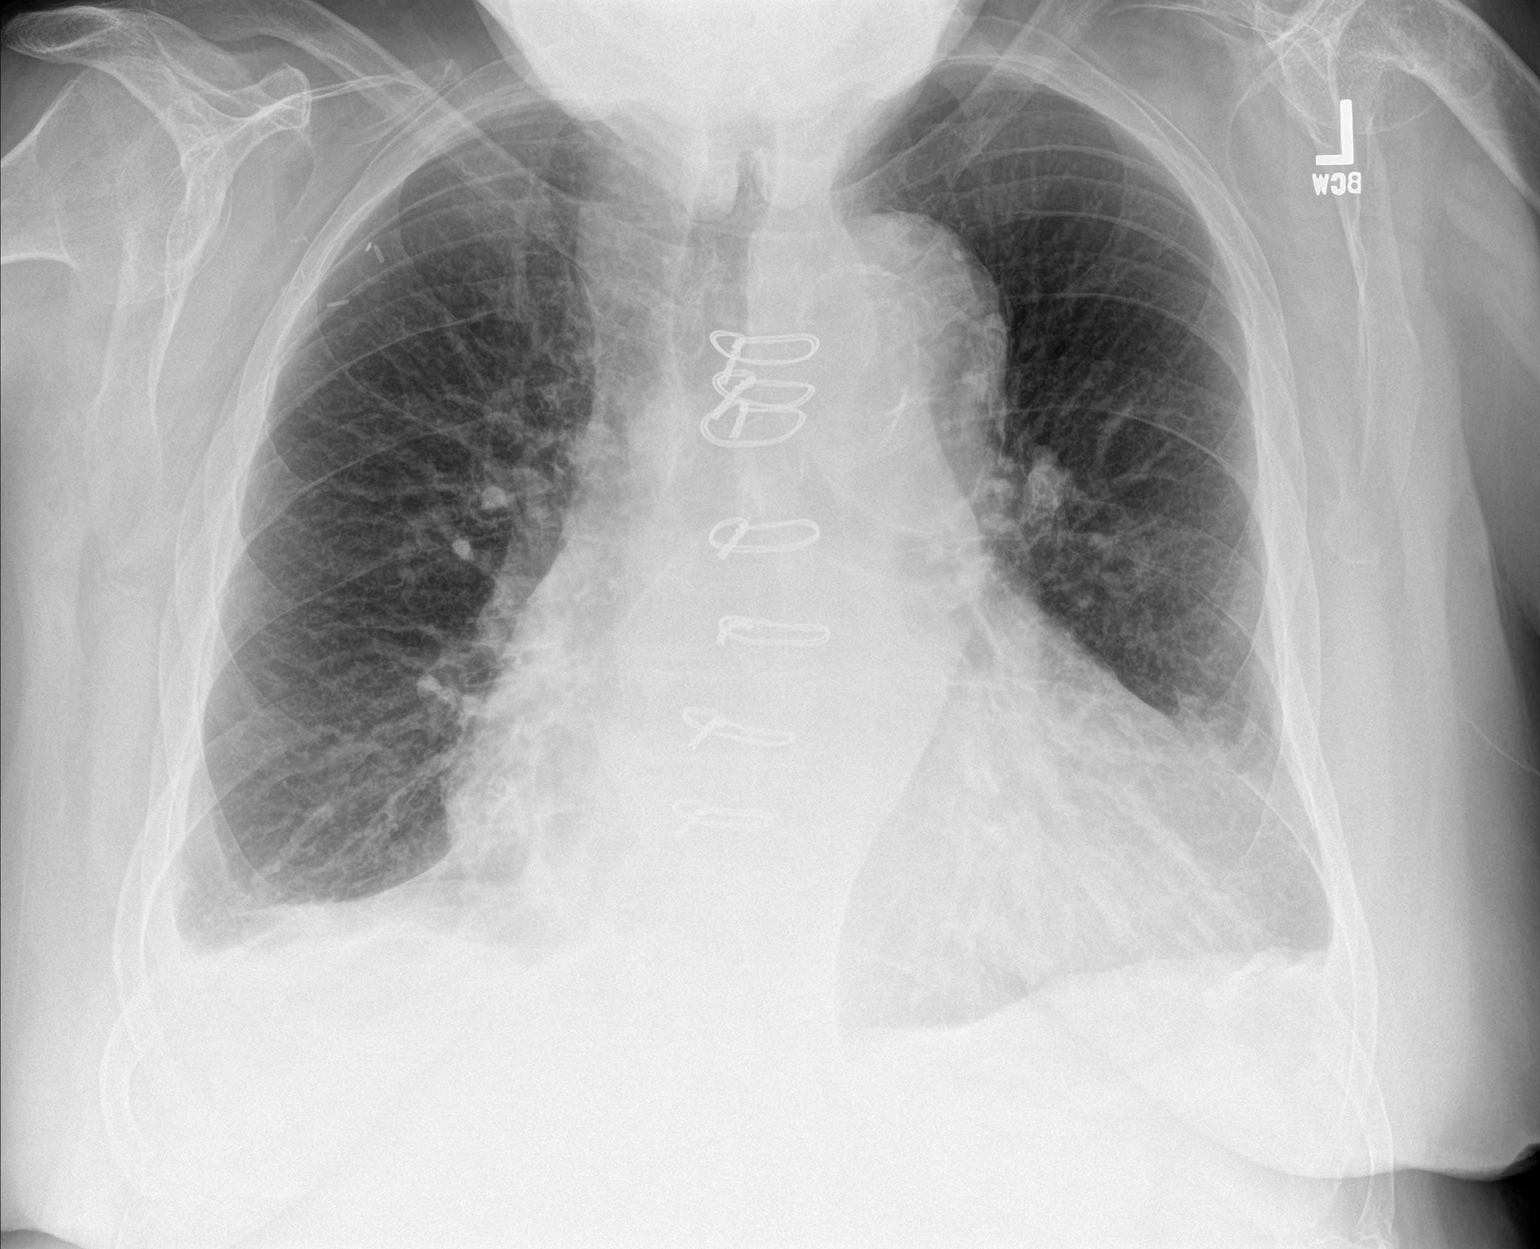

[chest lat]
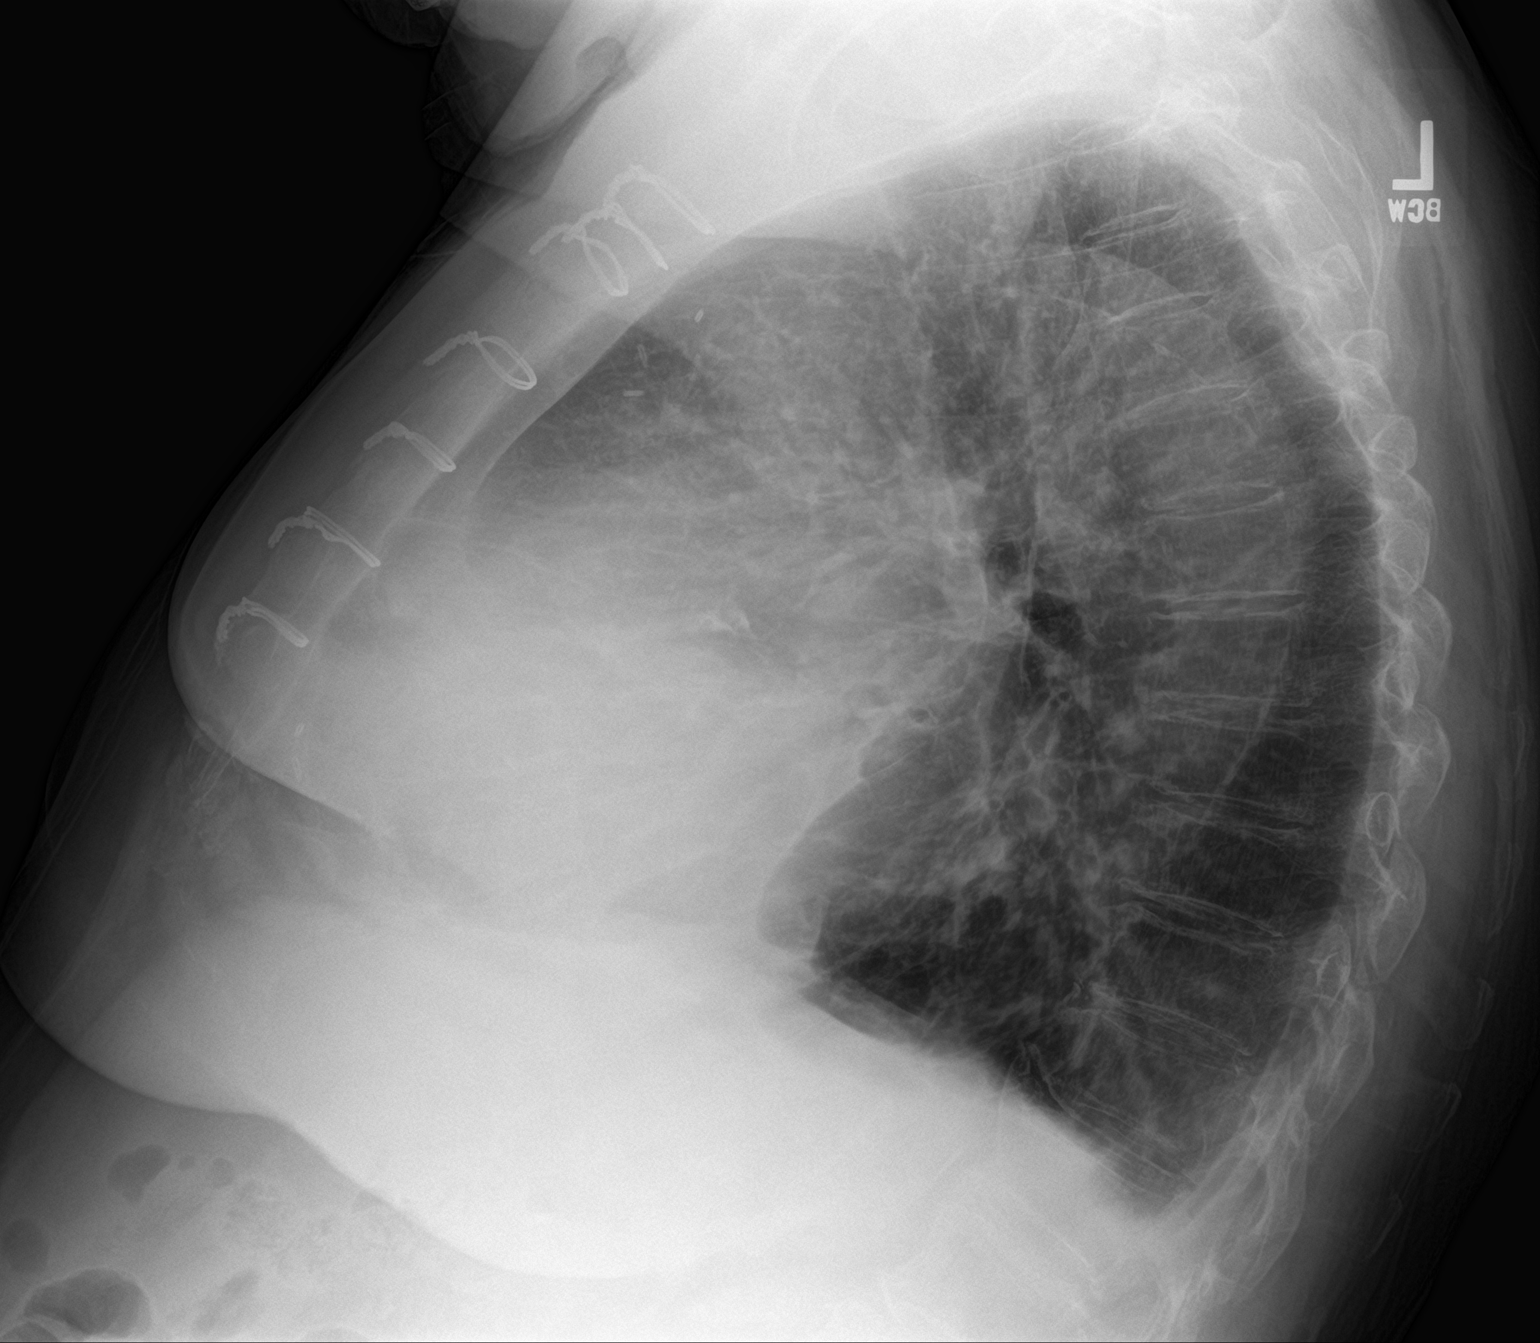

[2 of 2 positions shown; findings below may reference images not displayed]

FINDINGS: Chronic hyperinflation. Chronic cardiomegaly and aortic
tortuosity/dilatation. Known chronic dissection of the aortic arch
and descending segment. Trace pleural effusions. No edema or
pneumonia.
IMPRESSION: Trace pleural effusions, also seen on 06/11/2015 abdominal CT. No
pulmonary edema.

Chronic cardiomegaly and aortic enlargement. Patient has known
chronic aortic dissection.

COPD.
